# Patient Record
Sex: Male | Born: 1950
Health system: Southern US, Community
[De-identification: ages and names within clinical notes are randomized; demographics above are authoritative.]

## PROBLEM LIST (undated history)

## (undated) DIAGNOSIS — N529 Male erectile dysfunction, unspecified: Secondary | ICD-10-CM

## (undated) DIAGNOSIS — G5602 Carpal tunnel syndrome, left upper limb: Secondary | ICD-10-CM

## (undated) DIAGNOSIS — E739 Lactose intolerance, unspecified: Secondary | ICD-10-CM

## (undated) DIAGNOSIS — F17200 Nicotine dependence, unspecified, uncomplicated: Secondary | ICD-10-CM

## (undated) DIAGNOSIS — B029 Zoster without complications: Secondary | ICD-10-CM

## (undated) DIAGNOSIS — I10 Essential (primary) hypertension: Secondary | ICD-10-CM

## (undated) DIAGNOSIS — E559 Vitamin D deficiency, unspecified: Secondary | ICD-10-CM

## (undated) DIAGNOSIS — M545 Low back pain, unspecified: Secondary | ICD-10-CM

## (undated) DIAGNOSIS — T7840XA Allergy, unspecified, initial encounter: Secondary | ICD-10-CM

## (undated) DIAGNOSIS — G5600 Carpal tunnel syndrome, unspecified upper limb: Secondary | ICD-10-CM

## (undated) DIAGNOSIS — A048 Other specified bacterial intestinal infections: Secondary | ICD-10-CM

## (undated) DIAGNOSIS — S92912A Unspecified fracture of left toe(s), initial encounter for closed fracture: Secondary | ICD-10-CM

## (undated) DIAGNOSIS — IMO0001 Reserved for inherently not codable concepts without codable children: Secondary | ICD-10-CM

## (undated) DIAGNOSIS — I4892 Unspecified atrial flutter: Secondary | ICD-10-CM

## (undated) DIAGNOSIS — E785 Hyperlipidemia, unspecified: Secondary | ICD-10-CM

## (undated) DIAGNOSIS — G4700 Insomnia, unspecified: Secondary | ICD-10-CM

## (undated) DIAGNOSIS — K5790 Diverticulosis of intestine, part unspecified, without perforation or abscess without bleeding: Secondary | ICD-10-CM

## (undated) DIAGNOSIS — M1990 Unspecified osteoarthritis, unspecified site: Secondary | ICD-10-CM

## (undated) DIAGNOSIS — K219 Gastro-esophageal reflux disease without esophagitis: Secondary | ICD-10-CM

## (undated) DIAGNOSIS — M5135 Other intervertebral disc degeneration, thoracolumbar region: Secondary | ICD-10-CM

## (undated) DIAGNOSIS — M19012 Primary osteoarthritis, left shoulder: Secondary | ICD-10-CM

## (undated) DIAGNOSIS — U071 COVID-19: Secondary | ICD-10-CM

## (undated) HISTORY — PX: SPINE SURGERY: SHX786

## (undated) HISTORY — DX: Zoster without complications: B02.9

## (undated) HISTORY — DX: Gastro-esophageal reflux disease without esophagitis: K21.9

## (undated) HISTORY — PX: OTHER SURGICAL HISTORY: SHX169

## (undated) HISTORY — DX: Carpal tunnel syndrome, left upper limb: G56.02

## (undated) HISTORY — DX: Allergy, unspecified, initial encounter: T78.40XA

## (undated) HISTORY — DX: Other specified bacterial intestinal infections: A04.8

## (undated) HISTORY — DX: Hyperlipidemia, unspecified: E78.5

## (undated) HISTORY — DX: Reserved for inherently not codable concepts without codable children: IMO0001

## (undated) HISTORY — DX: Insomnia, unspecified: G47.00

## (undated) HISTORY — DX: Unspecified atrial flutter: I48.92

## (undated) HISTORY — PX: BACK SURGERY: SHX140

## (undated) HISTORY — PX: CARDIAC CATHETERIZATION: SHX172

## (undated) HISTORY — DX: Low back pain, unspecified: M54.50

## (undated) HISTORY — DX: Other intervertebral disc degeneration, thoracolumbar region: M51.35

## (undated) HISTORY — DX: Low back pain: M54.5

## (undated) HISTORY — DX: COVID-19: U07.1

## (undated) HISTORY — PX: COLONOSCOPY: SHX174

## (undated) HISTORY — DX: Essential (primary) hypertension: I10

## (undated) HISTORY — DX: Vitamin D deficiency, unspecified: E55.9

## (undated) HISTORY — DX: Primary osteoarthritis, left shoulder: M19.012

## (undated) HISTORY — DX: Nicotine dependence, unspecified, uncomplicated: F17.200

## (undated) HISTORY — DX: Carpal tunnel syndrome, unspecified upper limb: G56.00

## (undated) HISTORY — DX: Unspecified fracture of left toe(s), initial encounter for closed fracture: S92.912A

## (undated) HISTORY — PX: ESOPHAGOGASTRODUODENOSCOPY: SHX1529

## (undated) HISTORY — DX: Unspecified osteoarthritis, unspecified site: M19.90

## (undated) HISTORY — DX: Male erectile dysfunction, unspecified: N52.9

## (undated) HISTORY — DX: Diverticulosis of intestine, part unspecified, without perforation or abscess without bleeding: K57.90

## (undated) HISTORY — DX: Lactose intolerance, unspecified: E73.9

## (undated) HISTORY — PX: APPENDECTOMY: SHX54

## (undated) HISTORY — PX: CARPAL TUNNEL RELEASE: SHX101

---

## 2012-11-06 ENCOUNTER — Ambulatory Visit (INDEPENDENT_AMBULATORY_CARE_PROVIDER_SITE_OTHER): Payer: BC Managed Care – PPO | Admitting: Internal Medicine

## 2012-11-06 ENCOUNTER — Encounter: Payer: Self-pay | Admitting: Internal Medicine

## 2012-11-06 VITALS — BP 150/85 | HR 58 | Temp 97.3°F | Ht >= 80 in | Wt 212.2 lb

## 2012-11-06 DIAGNOSIS — M5135 Other intervertebral disc degeneration, thoracolumbar region: Secondary | ICD-10-CM | POA: Insufficient documentation

## 2012-11-06 DIAGNOSIS — K5730 Diverticulosis of large intestine without perforation or abscess without bleeding: Secondary | ICD-10-CM | POA: Insufficient documentation

## 2012-11-06 DIAGNOSIS — J302 Other seasonal allergic rhinitis: Secondary | ICD-10-CM

## 2012-11-06 DIAGNOSIS — Z720 Tobacco use: Secondary | ICD-10-CM

## 2012-11-06 DIAGNOSIS — K219 Gastro-esophageal reflux disease without esophagitis: Secondary | ICD-10-CM

## 2012-11-06 DIAGNOSIS — J309 Allergic rhinitis, unspecified: Secondary | ICD-10-CM

## 2012-11-06 DIAGNOSIS — E739 Lactose intolerance, unspecified: Secondary | ICD-10-CM

## 2012-11-06 DIAGNOSIS — E559 Vitamin D deficiency, unspecified: Secondary | ICD-10-CM

## 2012-11-06 DIAGNOSIS — IMO0002 Reserved for concepts with insufficient information to code with codable children: Secondary | ICD-10-CM

## 2012-11-06 DIAGNOSIS — E785 Hyperlipidemia, unspecified: Secondary | ICD-10-CM

## 2012-11-06 DIAGNOSIS — N529 Male erectile dysfunction, unspecified: Secondary | ICD-10-CM

## 2012-11-06 DIAGNOSIS — I10 Essential (primary) hypertension: Secondary | ICD-10-CM

## 2012-11-06 DIAGNOSIS — F17200 Nicotine dependence, unspecified, uncomplicated: Secondary | ICD-10-CM

## 2012-11-06 HISTORY — DX: Tobacco use: Z72.0

## 2012-11-06 LAB — BASIC METABOLIC PANEL
BUN: 14 mg/dL (ref 6–23)
CO2: 26 mEq/L (ref 19–32)
Chloride: 105 mEq/L (ref 96–112)
Creat: 1.01 mg/dL (ref 0.50–1.35)
Glucose, Bld: 84 mg/dL (ref 70–99)

## 2012-11-06 MED ORDER — CALCIUM CARB-CHOLECALCIFEROL 600-800 MG-UNIT PO TABS: 600.0000 mg | ORAL_TABLET | Freq: Every day | ORAL | Status: DC

## 2012-11-06 MED ORDER — TRIAMTERENE-HCTZ 37.5-25 MG PO CAPS: 1.0000 | ORAL_CAPSULE | ORAL | Status: DC

## 2012-11-06 MED ORDER — PANTOPRAZOLE SODIUM 40 MG PO TBEC: 40.0000 mg | DELAYED_RELEASE_TABLET | Freq: Every day | ORAL | Status: DC

## 2012-11-06 MED ORDER — ASPIRIN 81 MG PO TABS: 81.0000 mg | ORAL_TABLET | Freq: Every day | ORAL | Status: DC

## 2012-11-06 MED ORDER — AMLODIPINE BESY-BENAZEPRIL HCL 10-40 MG PO CAPS: 1.0000 | ORAL_CAPSULE | Freq: Every day | ORAL | Status: DC

## 2012-11-06 NOTE — Patient Instructions (Addendum)
General Instructions:  Return in 1 month for follow up with Dr. Shirlee Latch  Start taking your new blood pressure medication HCTZ-Triamterine 25 mg 37.5 mg daily.  Stop if noticing side effects    Treatment Goals:  Goals (1 Years of Data) as of 11/06/12         As of Today     Blood Pressure    . Blood Pressure < 140/90  150/85      Progress Toward Treatment Goals:  Treatment Goal 11/06/2012  Blood pressure unable to assess  Stop smoking smoking the same amount    Self Care Goals & Plans:  Self Care Goal 11/06/2012  Manage my medications take my medicines as prescribed; bring my medications to every visit; refill my medications on time  Monitor my health keep track of my blood pressure  Eat healthy foods drink diet soda or water instead of juice or soda; eat more vegetables; eat foods that are low in salt; eat baked foods instead of fried foods  Be physically active find an activity I enjoy       Care Management & Community Referrals:  Referral 11/06/2012  Referrals made for care management support none needed  Referrals made to community resources none        Itching Itching is a symptom that can be caused by many things. These include skin problems (including infections) as well as some internal diseases.  If the itching is affecting just one area of the body, it is most likely due to a common skin problem, such as:  Poison oak and poison ivy.  Contact dermatitis (skin irritation from a plant, chemicals, fiberglass, detergents, new cosmetic, new jewelry, or other substance).  Fungus (such as athlete's foot, jock itch, or ringworm).  Head lice  Dandruff  Insect bite  Infection (such as Shingles or other virus infections). If the itching is all over (widespread), the possible causes are many. These include:   Dry skin or eczema  Heat rash  Hives  Liver disorders  Kidney disorders TREATMENT  Localized itching   Lubrication of the skin. Use an ointment or  cream or other unperfumed moisturizers if the skin is dry. Apply frequently, especially after bathing.  Anti-itch medicines. These medications may help control the urge to scratch. Scratching always makes itching worse and increases the chance of getting an infection.  Cortisone creams and ointments. These help reduce the inflammation.  Antibiotics. Skin infections can cause itching. Topical or oral antibiotics may be needed for 10 to 20 days to get rid of an infection. If you can identify what caused the itching, avoid this substance in the future.  Widespread itching  The following measures may help to relieve itching regardless of the cause:   Wash the skin once with soap to remove irritants.  Bathe in tepid water with baking soda, cornstarch, or oatmeal.  Use calamine lotion (nonprescription) or a baking soda solution (1 teaspoon in 4 ounces of water on the skin).  Apply 1% hydrocortisone cream (no prescription needed). Do not use this if there might be a skin infection.  Avoid scratching.  Avoid itchy or tight-fitting clothes.  Avoid excessive heat, sweating, scented soaps, and swimming pools.  The lubricants, anti-itch medicines, etc. noted above may be helpful for controlling symptoms. SEEK MEDICAL CARE IF:   The itching becomes severe.  Your itch is not better after 1 week of treatment. Contact your caregiver to schedule further evaluation. Document Released: 06/21/2005 Document Revised: 09/13/2011 Document Reviewed:  12/09/2006 [employer] Patient Information 2013 [place], [place].  Hypertension As your heart beats, it forces blood through your arteries. This force is your blood pressure. If the pressure is too high, it is called hypertension (HTN) or high blood pressure. HTN is dangerous because you may have it and not know it. High blood pressure may mean that your heart has to work harder to pump blood. Your arteries may be narrow or stiff. The extra work puts you at risk  for heart disease, stroke, and other problems.  Blood pressure consists of two numbers, a higher number over a lower, 110/72, for example. It is stated as "110 over 72." The ideal is below 120 for the top number (systolic) and under 80 for the bottom (diastolic). Write down your blood pressure today. You should pay close attention to your blood pressure if you have certain conditions such as:  Heart failure.  Prior heart attack.  Diabetes  Chronic kidney disease.  Prior stroke.  Multiple risk factors for heart disease. To see if you have HTN, your blood pressure should be measured while you are seated with your arm held at the level of the heart. It should be measured at least twice. A one-time elevated blood pressure reading (especially in the Emergency Department) does not mean that you need treatment. There may be conditions in which the blood pressure is different between your right and left arms. It is important to see your caregiver soon for a recheck. Most people have essential hypertension which means that there is not a specific cause. This type of high blood pressure may be lowered by changing lifestyle factors such as:  Stress.  Smoking.  Lack of exercise.  Excessive weight.  Drug/tobacco/alcohol use.  Eating less salt. Most people do not have symptoms from high blood pressure until it has caused damage to the body. Effective treatment can often prevent, delay or reduce that damage. TREATMENT  When a cause has been identified, treatment for high blood pressure is directed at the cause. There are a large number of medications to treat HTN. These fall into several categories, and your caregiver will help you select the medicines that are best for you. Medications may have side effects. You should review side effects with your caregiver. If your blood pressure stays high after you have made lifestyle changes or started on medicines,   Your medication(s) may need to be  changed.  Other problems may need to be addressed.  Be certain you understand your prescriptions, and know how and when to take your medicine.  Be sure to follow up with your caregiver within the time frame advised (usually within two weeks) to have your blood pressure rechecked and to review your medications.  If you are taking more than one medicine to lower your blood pressure, make sure you know how and at what times they should be taken. Taking two medicines at the same time can result in blood pressure that is too low. SEEK IMMEDIATE MEDICAL CARE IF:  You develop a severe headache, blurred or changing vision, or confusion.  You have unusual weakness or numbness, or a faint feeling.  You have severe chest or abdominal pain, vomiting, or breathing problems. MAKE SURE YOU:   Understand these instructions.  Will watch your condition.  Will get help right away if you are not doing well or get worse. Document Released: 06/21/2005 Document Revised: 09/13/2011 Document Reviewed: 02/09/2008 [facility] Patient Information 2013 [place], [place].  Erectile Dysfunction Erectile dysfunction (ED) is  the inability to get a good enough erection to have sexual intercourse. ED may involve:  Inability to get an erection.  Lack of enough hardness to allow penetration.  Loss of the erection before sex is finished.  Premature ejaculation.  Any combination of these problems if they occur more than 25% of the time. CAUSES  Certain drugs, such as:  Pain relievers.  Antihistamines.  Antidepressants.  Blood pressure medicines.  Water pills.  Ulcer medicines.  Muscle relaxants.  Illegal drugs.  Excessive drinking.  Psychological causes, such as:  Anxiety.  Depression.  Sadness.  Exhaustion.  Performance fear.  Stress.  Physical causes, such as:  Artery problems. This may include diabetes, smoking, liver disease, or atherosclerosis.  High blood pressure.  Hormonal  problems, such as low testosterone.  Obesity.  Nerve problems. This may include back or pelvic injuries, diabetes, multiple sclerosis, Parkinson's disease, or some surgeries. SYMPTOMS  Inability to get an erection.  Lack of enough hardness to allow penetration.  Loss of the erection before sex is finished.  Premature ejaculation.  Normal erections at some times, but with frequent unsatisfactory episodes.  Orgasms that are not satisfactory in sensation or frequency.  Low sexual satisfaction in either partner because of erection problems.  A curved penis occurring with erection. The curve may cause pain or may be too curved to allow for intercourse.  Never having nighttime erections. DIAGNOSIS Your caregiver can often diagnose this condition by:  Performing a physical exam to find other diseases or specific problems with the penis.  Asking you detailed questions about the problem.  Performing blood tests to check for diabetes or to measure hormone levels.  Performing urine tests to find other underlying health conditions.  Performing an ultrasound to check for scarring.  Performing a test to check blood flow to the penis.  Doing a sleep study at home to measure nighttime erections. TREATMENT   You may be prescribed medicines by mouth.  You may be given medicine injections into the penis.  You may be prescribed a vacuum pump with a ring.  Penile implant surgery may be performed. You may receive:  An inflatable implant.  A semi-rigid implant.  Blood vessel surgery may be performed. HOME CARE INSTRUCTIONS  Take all medicine as directed by your caregiver. Do not take any other medicines without talking to your caregiver first.  Follow your caregiver's directions for specific treatments as prescribed.  Follow up with your caregiver as directed. Document Released: 06/18/2000 Document Revised: 09/13/2011 Document Reviewed: 10/11/2010 [facility] Patient Information  2013 [place], [place].

## 2012-11-07 LAB — PROLACTIN: Prolactin: 6.2 ng/mL (ref 2.1–17.1)

## 2012-11-08 ENCOUNTER — Encounter: Payer: Self-pay | Admitting: Internal Medicine

## 2012-11-09 ENCOUNTER — Encounter: Payer: Self-pay | Admitting: Internal Medicine

## 2012-11-09 DIAGNOSIS — E785 Hyperlipidemia, unspecified: Secondary | ICD-10-CM | POA: Insufficient documentation

## 2012-11-09 DIAGNOSIS — E559 Vitamin D deficiency, unspecified: Secondary | ICD-10-CM | POA: Insufficient documentation

## 2012-11-09 NOTE — Assessment & Plan Note (Signed)
  Assessment: Progress toward smoking cessation:  smoking the same amount Barriers to progress toward smoking cessation:  none Comments: none  Plan: Instruction/counseling given:  I advised patient to stop smoking. Educational resources provided:  [employer]) brochure Self management tools provided:    Medications to assist with smoking cessation:  discussed Nicotine patch Patient agreed to the following self-care plans for smoking cessation:    Other plans: continue counseling

## 2012-11-09 NOTE — Assessment & Plan Note (Signed)
Checked HA1C for w/u of etiology.  Other considerations could be BP medications, smoking Advise patient if ED is not improving consider referral to urology.

## 2012-11-09 NOTE — Progress Notes (Signed)
  Subjective:    Patient ID: John Keith, male    DOB: 05/22/1951, 62 y.o.   MRN: [id number]  HPI Comments: Patient seen and evaluated 11/06/12.  62 y.o former [facility] patient here to establish care.  Records were reviewed from [facility] (05/2012 tsh 0.527, PSA 1.2, vitamin D level 20.7 (low).  PMH HTN and he thinks he is allergic to Hydralazine with doses of medication he notices itching.  Erectile dysfunction x >1 year.  No decreased libido.  Cialis and Viagra are not working.  He denies any other concerns or complaints.   Additional medications: Aleve as needed SH: Married.  He is the only child.  No kids. He has a step son. Smokes 1/2 ppd.  Denies alcohol, other drugs. Patient works 6 days a week.  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   Cardiovascular: Negative for chest pain and leg swelling.  Gastrointestinal: Negative for abdominal pain, constipation and blood in stool.       Heartburn   Genitourinary: Negative for hematuria.       Objective:   Physical Exam  Nursing note and vitals reviewed. Constitutional: He is oriented to person, place, and time. He appears well-developed and well-nourished. He is cooperative.  pleasant  HENT:  Head: Normocephalic and atraumatic.  Mouth/Throat: Oropharynx is clear and moist and mucous membranes are normal. No oropharyngeal exudate.  Eyes: Conjunctivae are normal. Pupils are equal, round, and reactive to light. Right eye exhibits no discharge. Left eye exhibits no discharge. No scleral icterus.  Cardiovascular: Normal rate, regular rhythm, S1 normal, S2 normal and normal heart sounds.   No murmur heard. Pulmonary/Chest: Effort normal and breath sounds normal.  Abdominal: Soft. Bowel sounds are normal. He exhibits no distension. There is no tenderness.  Musculoskeletal: He exhibits no edema.  Neurological:  He is alert and oriented to person, place, and time. He has normal strength. Gait normal.  Skin: Skin is warm, dry and intact. No rash noted.  Psychiatric: He has a normal mood and affect. His speech is normal and behavior is normal. Judgment and thought content normal. Cognition and memory are normal.          Assessment & Plan:  F/u 1 month HTN

## 2012-11-09 NOTE — Assessment & Plan Note (Signed)
05/2012 lipid panel TC 177, TG 83, HDL 66, LDL 94

## 2012-11-09 NOTE — Assessment & Plan Note (Signed)
Will give supplementation daily

## 2012-11-09 NOTE — Assessment & Plan Note (Signed)
150/85 (58) then 164/87 (52)  Will d/c Hydralazine 100 tid due to patient itching Will continue Norvasc 10-Benazepril 40 daily and change HCTZ 25 mg to combination with Triamterine 37.5.    BP Readings from Last 3 Encounters:  11/06/12 150/85    Lab Results  Component Value Date   NA 139 11/06/2012   K 4.0 11/06/2012   CREATININE 1.01 11/06/2012    Assessment: Blood pressure control: mildly elevated Progress toward BP goal:  unable to assess Comments: see treatment changes above   Plan: Medications: see treatment changes above Educational resources provided: handout Self management tools provided: instructions for home blood pressure monitoring Other plans: f/u in 1 month

## 2012-11-09 NOTE — Assessment & Plan Note (Signed)
Protonix 40 mg daily

## 2012-11-09 NOTE — Progress Notes (Signed)
Case discussed with Dr. McLean at the time of the visit.  We reviewed the resident's history and exam and pertinent patient test results.  I agree with the assessment, diagnosis and plan of care documented in the resident's note. 

## 2013-01-03 ENCOUNTER — Ambulatory Visit (INDEPENDENT_AMBULATORY_CARE_PROVIDER_SITE_OTHER): Payer: BC Managed Care – PPO | Admitting: Internal Medicine

## 2013-01-03 ENCOUNTER — Encounter: Payer: Self-pay | Admitting: Internal Medicine

## 2013-01-03 VITALS — BP 138/83 | HR 55 | Temp 97.4°F | Ht >= 80 in | Wt 211.1 lb

## 2013-01-03 DIAGNOSIS — G5600 Carpal tunnel syndrome, unspecified upper limb: Secondary | ICD-10-CM

## 2013-01-03 DIAGNOSIS — R42 Dizziness and giddiness: Secondary | ICD-10-CM

## 2013-01-03 DIAGNOSIS — N529 Male erectile dysfunction, unspecified: Secondary | ICD-10-CM

## 2013-01-03 DIAGNOSIS — Z720 Tobacco use: Secondary | ICD-10-CM

## 2013-01-03 DIAGNOSIS — G5602 Carpal tunnel syndrome, left upper limb: Secondary | ICD-10-CM | POA: Insufficient documentation

## 2013-01-03 DIAGNOSIS — F17200 Nicotine dependence, unspecified, uncomplicated: Secondary | ICD-10-CM

## 2013-01-03 DIAGNOSIS — I10 Essential (primary) hypertension: Secondary | ICD-10-CM

## 2013-01-03 DIAGNOSIS — E559 Vitamin D deficiency, unspecified: Secondary | ICD-10-CM

## 2013-01-03 DIAGNOSIS — Z8669 Personal history of other diseases of the nervous system and sense organs: Secondary | ICD-10-CM

## 2013-01-03 MED ORDER — HYDRALAZINE HCL 100 MG PO TABS: 100.0000 mg | ORAL_TABLET | Freq: Three times a day (TID) | ORAL | Status: DC

## 2013-01-03 NOTE — Progress Notes (Signed)
  Subjective:    Patient ID: John Keith, male    DOB: 21-Jun-1951, 62 y.o.   MRN: [id number]  HPI Comments: 62 y.o F/u for HTN and ED.  He states since last visit he felt dizzy at work and weak with blue and white pill (per epocrates is Norvasc-Benazepril 10-40 mg daily).  He is also taking Triamterine-HCTZ 37.5-25 for BP.  Due to this happening he resumed Hydralazine 100 mg tid with toleration (i.e no itching) and stopped the Norvasc Benazepril.  He states tomatoes make him itch.  He denies being dizzy or weak.  He states he thought this medication was causing him to itch but actually it was eating tomatoes.  BP is 138/83 today. HR 55 today.    He states he has carpal tunnel in his left wrist and has previously worn a wrist splint.  Recently w/in the last week with physical activity his left hand fingers have started being numb and tingling.    He states his erectile dysfunction is still a problem and wonders what to do next.   He is smoking less 5 cigarettes daily and trying to quit     Review of Systems  Respiratory: Negative for shortness of breath.   Cardiovascular: Negative for chest pain.  Neurological: Negative for dizziness, weakness and light-headedness.       Objective:   Physical Exam  Nursing note and vitals reviewed. Constitutional: He is oriented to person, place, and time. Vital signs are normal. He appears well-developed and well-nourished. He is cooperative.  HENT:  Head: Normocephalic and atraumatic.  Mouth/Throat: No oropharyngeal exudate.  Eyes: Conjunctivae are normal. Pupils are equal, round, and reactive to light. Right eye exhibits no discharge. Left eye exhibits no discharge. No scleral icterus.  Cardiovascular: Normal rate, regular rhythm, S1 normal, S2 normal and normal heart sounds.   No murmur heard. Pulmonary/Chest: Effort normal and breath sounds normal.  Abdominal: Soft. Bowel sounds are normal. He exhibits no distension. There is no tenderness.   Musculoskeletal: He exhibits no edema.  No deformities left wrist   Neurological: He is alert and oriented to person, place, and time. He has normal strength. Gait normal.  Skin: Skin is warm, dry and intact. No rash noted.  Psychiatric: He has a normal mood and affect. His speech is normal and behavior is normal. Judgment and thought content normal. Cognition and memory are normal.          Assessment & Plan:  Check blood pressure and keep daily log  Follow up in 3-4 months for blood pressure. Appointment sooner if needed Call if feeling dizzy again [phone]  Wear wrist splint at night or with activity  Try to quit smoking

## 2013-01-03 NOTE — Assessment & Plan Note (Addendum)
BP Readings from Last 3 Encounters:  01/03/13 138/83  11/06/12 150/85    Lab Results  Component Value Date   NA 139 11/06/2012   K 4.0 11/06/2012   CREATININE 1.01 11/06/2012    Assessment: Blood pressure control: controlled Progress toward BP goal:  at goal Comments: controlled   Plan: Medications:  patient feeling lightheaded with Norvasc 10-Benazipril 40. He resumed Hydralazine 100 mg tid and is taking Triamterine-HCTZ 37.5-25.  will d/c CCB-ACEI and keep current meds with BP good control. Advised to call back if feel lightheaded or dizzy and keep a log of BP daily .  If review log of BP in the future consider titrating down BP medications  Educational resources provided: brochure;handout Self management tools provided:   Other plans: recommend he log blood pressure

## 2013-01-03 NOTE — Assessment & Plan Note (Signed)
  Assessment: Progress toward smoking cessation:  smoking less Barriers to progress toward smoking cessation:   (duration of smoking but trying to quit) Comments: given info to quit he is trying  Plan: Instruction/counseling given:  I counseled patient on the dangers of tobacco use, advised patient to stop smoking, and reviewed strategies to maximize success. Educational resources provided:  [employer]) brochure Self management tools provided:    Medications to assist with smoking cessation:  none Patient agreed to the following self-care plans for smoking cessation: call [facility] (1-800-QUIT-NOW)  Other plans: none

## 2013-01-03 NOTE — Assessment & Plan Note (Signed)
Repeat 25 hydroxy vitamin D today

## 2013-01-03 NOTE — Patient Instructions (Addendum)
General Instructions: Check blood pressure and keep daily log  Follow up in 3-4 months for blood pressure. Appointment sooner if needed Call if feeling dizzy again [phone]  Wear wrist splint at night  Try to quit smoking   Treatment Goals:  Goals (1 Years of Data) as of 01/03/13         As of Today 11/06/12     Blood Pressure    . Blood Pressure < 140/90  138/83 150/85      Progress Toward Treatment Goals:  Treatment Goal 01/03/2013  Blood pressure at goal  Stop smoking smoking less    Self Care Goals & Plans:  Self Care Goal 01/03/2013  Manage my medications take my medicines as prescribed; bring my medications to every visit; refill my medications on time  Monitor my health keep track of my blood pressure; bring my blood pressure log to each visit  Eat healthy foods drink diet soda or water instead of juice or soda; eat more vegetables; eat foods that are low in salt; eat baked foods instead of fried foods  Be physically active find an activity I enjoy  Stop smoking call [facility] (1-800-QUIT-NOW)  Meeting treatment goals maintain the current self-care plan       Care Management & Community Referrals:  Referral 01/03/2013  Referrals made for care management support none needed  Referrals made to community resources none       Smoking Cessation Quitting smoking is important to your health and has many advantages. However, it is not always easy to quit since nicotine is a very addictive drug. Often times, people try 3 times or more before being able to quit. This document explains the best ways for you to prepare to quit smoking. Quitting takes hard work and a lot of effort, but you can do it. ADVANTAGES OF QUITTING SMOKING  You will live longer, feel better, and live better.  Your body will feel the impact of quitting smoking almost immediately.  Within 20 minutes, blood pressure decreases. Your pulse returns to its normal level.  After 8 hours, carbon monoxide  levels in the blood return to normal. Your oxygen level increases.  After 24 hours, the chance of having a heart attack starts to decrease. Your breath, hair, and body stop smelling like smoke.  After 48 hours, damaged nerve endings begin to recover. Your sense of taste and smell improve.  After 72 hours, the body is virtually free of nicotine. Your bronchial tubes relax and breathing becomes easier.  After 2 to 12 weeks, lungs can hold more air. Exercise becomes easier and circulation improves.  The risk of having a heart attack, stroke, cancer, or lung disease is greatly reduced.  After 1 year, the risk of coronary heart disease is cut in half.  After 5 years, the risk of stroke falls to the same as a nonsmoker.  After 10 years, the risk of lung cancer is cut in half and the risk of other cancers decreases significantly.  After 15 years, the risk of coronary heart disease drops, usually to the level of a nonsmoker.  If you are pregnant, quitting smoking will improve your chances of having a healthy baby.  The people you live with, especially any children, will be healthier.  You will have extra money to spend on things other than cigarettes. QUESTIONS TO THINK ABOUT BEFORE ATTEMPTING TO QUIT You may want to talk about your answers with your caregiver.  Why do you want to quit?  If you tried to quit in the past, what helped and what did not?  What will be the most difficult situations for you after you quit? How will you plan to handle them?  Who can help you through the tough times? Your family? Friends? A caregiver?  What pleasures do you get from smoking? What ways can you still get pleasure if you quit? Here are some questions to ask your caregiver:  How can you help me to be successful at quitting?  What medicine do you think would be best for me and how should I take it?  What should I do if I need more help?  What is smoking withdrawal like? How can I get  information on withdrawal? GET READY  Set a quit date.  Change your environment by getting rid of all cigarettes, ashtrays, matches, and lighters in your home, car, or work. Do not let people smoke in your home.  Review your past attempts to quit. Think about what worked and what did not. GET SUPPORT AND ENCOURAGEMENT You have a better chance of being successful if you have help. You can get support in many ways.  Tell your family, friends, and co-workers that you are going to quit and need their support. Ask them not to smoke around you.  Get individual, group, or telephone counseling and support. Programs are available at [employer] and health centers. Call your local health department for information about programs in your area.  Spiritual beliefs and practices may help some smokers quit.  Download a "quit meter" on your computer to keep track of quit statistics, such as how long you have gone without smoking, cigarettes not smoked, and money saved.  Get a self-help book about quitting smoking and staying off of tobacco. LEARN NEW SKILLS AND BEHAVIORS  Distract yourself from urges to smoke. Talk to someone, go for a walk, or occupy your time with a task.  Change your normal routine. Take a different route to work. Drink tea instead of coffee. Eat breakfast in a different place.  Reduce your stress. Take a hot bath, exercise, or read a book.  Plan something enjoyable to do every day. Reward yourself for not smoking.  Explore interactive web-based programs that specialize in helping you quit. GET MEDICINE AND USE IT CORRECTLY Medicines can help you stop smoking and decrease the urge to smoke. Combining medicine with the above behavioral methods and support can greatly increase your chances of successfully quitting smoking.  Nicotine replacement therapy helps deliver nicotine to your body without the negative effects and risks of smoking. Nicotine replacement therapy includes  nicotine gum, lozenges, inhalers, nasal sprays, and skin patches. Some may be available over-the-counter and others require a prescription.  Antidepressant medicine helps people abstain from smoking, but how this works is unknown. This medicine is available by prescription.  Nicotinic receptor partial agonist medicine simulates the effect of nicotine in your brain. This medicine is available by prescription. Ask your caregiver for advice about which medicines to use and how to use them based on your health history. Your caregiver will tell you what side effects to look out for if you choose to be on a medicine or therapy. Carefully read the information on the package. Do not use any other product containing nicotine while using a nicotine replacement product.  RELAPSE OR DIFFICULT SITUATIONS Most relapses occur within the first 3 months after quitting. Do not be discouraged if you start smoking again. Remember, most people try several  times before finally quitting. You may have symptoms of withdrawal because your body is used to nicotine. You may crave cigarettes, be irritable, feel very hungry, cough often, get headaches, or have difficulty concentrating. The withdrawal symptoms are only temporary. They are strongest when you first quit, but they will go away within 10 14 days. To reduce the chances of relapse, try to:  Avoid drinking alcohol. Drinking lowers your chances of successfully quitting.  Reduce the amount of caffeine you consume. Once you quit smoking, the amount of caffeine in your body increases and can give you symptoms, such as a rapid heartbeat, sweating, and anxiety.  Avoid smokers because they can make you want to smoke.  Do not let weight gain distract you. Many smokers will gain weight when they quit, usually less than 10 pounds. Eat a healthy diet and stay active. You can always lose the weight gained after you quit.  Find ways to improve your mood other than smoking. FOR  MORE INFORMATION  www.smokefree.gov  Document Released: 06/15/2001 Document Revised: 12/21/2011 Document Reviewed: 09/30/2011 [facility] Patient Information 2014 [place], [place].  Erectile Dysfunction Erectile dysfunction (ED) is the inability to get a good enough erection to have sexual intercourse. ED may involve:  Inability to get an erection.  Lack of enough hardness to allow penetration.  Loss of the erection before sex is finished.  Premature ejaculation.  Any combination of these problems if they occur more than 25% of the time. CAUSES  Certain drugs, such as:  Pain relievers.  Antihistamines.  Antidepressants.  Blood pressure medicines.  Water pills.  Ulcer medicines.  Muscle relaxants.  Illegal drugs.  Excessive drinking.  Psychological causes, such as:  Anxiety.  Depression.  Sadness.  Exhaustion.  Performance fear.  Stress.  Physical causes, such as:  Artery problems. This may include diabetes, smoking, liver disease, or atherosclerosis.  High blood pressure.  Hormonal problems, such as low testosterone.  Obesity.  Nerve problems. This may include back or pelvic injuries, diabetes, multiple sclerosis, Parkinson's disease, or some surgeries. SYMPTOMS  Inability to get an erection.  Lack of enough hardness to allow penetration.  Loss of the erection before sex is finished.  Premature ejaculation.  Normal erections at some times, but with frequent unsatisfactory episodes.  Orgasms that are not satisfactory in sensation or frequency.  Low sexual satisfaction in either partner because of erection problems.  A curved penis occurring with erection. The curve may cause pain or may be too curved to allow for intercourse.  Never having nighttime erections. DIAGNOSIS Your caregiver can often diagnose this condition by:  Performing a physical exam to find other diseases or specific problems with the penis.  Asking you detailed  questions about the problem.  Performing blood tests to check for diabetes or to measure hormone levels.  Performing urine tests to find other underlying health conditions.  Performing an ultrasound to check for scarring.  Performing a test to check blood flow to the penis.  Doing a sleep study at home to measure nighttime erections. TREATMENT   You may be prescribed medicines by mouth.  You may be given medicine injections into the penis.  You may be prescribed a vacuum pump with a ring.  Penile implant surgery may be performed. You may receive:  An inflatable implant.  A semi-rigid implant.  Blood vessel surgery may be performed. HOME CARE INSTRUCTIONS  Take all medicine as directed by your caregiver. Do not take any other medicines without talking to your caregiver  first.  Follow your caregiver's directions for specific treatments as prescribed.  Follow up with your caregiver as directed. Document Released: 06/18/2000 Document Revised: 09/13/2011 Document Reviewed: 10/11/2010 [facility] Patient Information 2014 [place], [place].  Hypertension As your heart beats, it forces blood through your arteries. This force is your blood pressure. If the pressure is too high, it is called hypertension (HTN) or high blood pressure. HTN is dangerous because you may have it and not know it. High blood pressure may mean that your heart has to work harder to pump blood. Your arteries may be narrow or stiff. The extra work puts you at risk for heart disease, stroke, and other problems.  Blood pressure consists of two numbers, a higher number over a lower, 110/72, for example. It is stated as "110 over 72." The ideal is below 120 for the top number (systolic) and under 80 for the bottom (diastolic). Write down your blood pressure today. You should pay close attention to your blood pressure if you have certain conditions such as:  Heart failure.  Prior heart attack.  Diabetes  Chronic  kidney disease.  Prior stroke.  Multiple risk factors for heart disease. To see if you have HTN, your blood pressure should be measured while you are seated with your arm held at the level of the heart. It should be measured at least twice. A one-time elevated blood pressure reading (especially in the Emergency Department) does not mean that you need treatment. There may be conditions in which the blood pressure is different between your right and left arms. It is important to see your caregiver soon for a recheck. Most people have essential hypertension which means that there is not a specific cause. This type of high blood pressure may be lowered by changing lifestyle factors such as:  Stress.  Smoking.  Lack of exercise.  Excessive weight.  Drug/tobacco/alcohol use.  Eating less salt. Most people do not have symptoms from high blood pressure until it has caused damage to the body. Effective treatment can often prevent, delay or reduce that damage. TREATMENT  When a cause has been identified, treatment for high blood pressure is directed at the cause. There are a large number of medications to treat HTN. These fall into several categories, and your caregiver will help you select the medicines that are best for you. Medications may have side effects. You should review side effects with your caregiver. If your blood pressure stays high after you have made lifestyle changes or started on medicines,   Your medication(s) may need to be changed.  Other problems may need to be addressed.  Be certain you understand your prescriptions, and know how and when to take your medicine.  Be sure to follow up with your caregiver within the time frame advised (usually within two weeks) to have your blood pressure rechecked and to review your medications.  If you are taking more than one medicine to lower your blood pressure, make sure you know how and at what times they should be taken. Taking two  medicines at the same time can result in blood pressure that is too low. SEEK IMMEDIATE MEDICAL CARE IF:  You develop a severe headache, blurred or changing vision, or confusion.  You have unusual weakness or numbness, or a faint feeling.  You have severe chest or abdominal pain, vomiting, or breathing problems. MAKE SURE YOU:   Understand these instructions.  Will watch your condition.  Will get help right away if you are not  doing well or get worse. Document Released: 06/21/2005 Document Revised: 09/13/2011 Document Reviewed: 02/09/2008 [facility] Patient Information 2014 [place].

## 2013-01-03 NOTE — Assessment & Plan Note (Addendum)
Will refer to urology and I worked up at last visit.  Will see what other input from urology.

## 2013-01-03 NOTE — Assessment & Plan Note (Signed)
Wrist splint at night or with activity

## 2013-01-04 ENCOUNTER — Encounter: Payer: Self-pay | Admitting: Internal Medicine

## 2013-01-04 LAB — VITAMIN D 25 HYDROXY (VIT D DEFICIENCY, FRACTURES): Vit D, 25-Hydroxy: 42 ng/mL (ref 30–89)

## 2013-01-08 NOTE — Progress Notes (Signed)
Case discussed with Dr. McLean at the time of the visit.  We reviewed the resident's history and exam and pertinent patient test results.  I agree with the assessment, diagnosis, and plan of care documented in the resident's note.     

## 2013-01-22 ENCOUNTER — Ambulatory Visit (INDEPENDENT_AMBULATORY_CARE_PROVIDER_SITE_OTHER): Payer: BC Managed Care – PPO | Admitting: Internal Medicine

## 2013-01-22 ENCOUNTER — Encounter: Payer: Self-pay | Admitting: Internal Medicine

## 2013-01-22 VITALS — BP 136/84 | HR 66 | Temp 97.6°F | Ht >= 80 in | Wt 211.5 lb

## 2013-01-22 DIAGNOSIS — H6123 Impacted cerumen, bilateral: Secondary | ICD-10-CM

## 2013-01-22 DIAGNOSIS — H6120 Impacted cerumen, unspecified ear: Secondary | ICD-10-CM

## 2013-01-22 DIAGNOSIS — I10 Essential (primary) hypertension: Secondary | ICD-10-CM

## 2013-01-22 MED ORDER — CARBAMIDE PEROXIDE 6.5 % OT SOLN: 5.0000 [drp] | Freq: Two times a day (BID) | OTIC | Status: DC

## 2013-01-22 MED ORDER — NEOMYCIN-POLYMYXIN-HC 3.5-10000-1 OT SUSP: 3.0000 [drp] | Freq: Four times a day (QID) | OTIC | Status: DC

## 2013-01-22 NOTE — Progress Notes (Signed)
I saw and evaluated the patient.  I personally confirmed the key portions of the history and exam documented by Dr. McLean and I reviewed pertinent patient test results.  The assessment, diagnosis, and plan were formulated together and I agree with the documentation in the resident's note.    

## 2013-01-22 NOTE — Progress Notes (Signed)
  Subjective:    Patient ID: John Keith, male    DOB: 1951/05/22, 62 y.o.   MRN: [id number]  HPI Comments: 62 y.o PMH (HTN BP 136/84), HLD, tobacco abuse, ED (with urology appt coming up soon).  He presents for decreased hearing left ear.  He states Tuesday he had decreased hearing and felt like wax was in his ear.  He bought cleaner from [employer] which had peroxide in them in he used drops 4-5 drops for 3 days and still feels like he cant hear in his left ear.  He does not feel like the ear solution helped.  He also states on Sunday his left ear was itching and he used a Qtip to try to get the wax out.  The itching comes and goes. He denies ear pain, drainage, fever, chills.    ROS per HPI-focused ROS today     Review of Systems  Neurological: Negative for dizziness.       Objective:   Physical Exam  Nursing note and vitals reviewed. Constitutional: Vital signs are normal. He appears well-developed and well-nourished. He is cooperative. No distress.  HENT:  Head: Normocephalic and atraumatic.  Right Ear: Hearing and external ear normal. No drainage, swelling or tenderness.  Left Ear: Tympanic membrane and external ear normal. No drainage, swelling or tenderness. Decreased hearing is noted.  Left ear: Canal initially not normal due to cerumen.  Left ear decreased hearing which improved after irrigation.  Both ears with increased cerumen. Left ear with small abrasion which was erythematous w/o drainage.   Right ear:  with cerumen after irrigation and patient stated that ear felt sore with irrigation. Right ear with even smaller abrasion w/o drainage. Hard to see canal and TM after irrigation on the right but prior to irrigation TM and canal normal  Neurological: He is alert.  Skin: He is not diaphoretic.  Psychiatric: He has a normal mood and affect. His speech is normal and behavior is normal. Judgment and thought content normal. Cognition and memory are normal.           Assessment & Plan:  Follow up in 3 months, sooner if needed

## 2013-01-22 NOTE — Patient Instructions (Addendum)
Please pick up your medications from the pharmacy  If your right ear is not getting better we can refer you to ear nose and throat doctor.  Follow up in 3 months for chronic medical conditions sooner if needed.   Carbamide Peroxide ear solution What is this medicine? CARBAMIDE PEROXIDE (CAR bah mide per OX ide) is used to soften and help remove ear wax. This medicine may be used for other purposes; ask your health care provider or pharmacist if you have questions. What should I tell my health care provider before I take this medicine? They need to know if you have any of these conditions: -dizziness -ear discharge -ear pain, irritation or rash -infection -perforated eardrum (hole in eardrum) -an unusual or allergic reaction to carbamide peroxide, glycerin, hydrogen peroxide, other medicines, foods, dyes, or preservatives -pregnant or trying to get pregnant -breast-feeding How should I use this medicine? This medicine is only for use in the outer ear canal. Follow the directions carefully. Wash hands before and after use. The solution may be warmed by holding the bottle in the hand for 1 to 2 minutes. Lie with the affected ear facing upward. Place the proper number of drops into the ear canal. After the drops are instilled, remain lying with the affected ear upward for 5 minutes to help the drops stay in the ear canal. A cotton ball may be gently inserted at the ear opening for no longer than 5 to 10 minutes to ensure retention. Repeat, if necessary, for the opposite ear. Do not touch the tip of the dropper to the ear, fingertips, or other surface. Do not rinse the dropper after use. Keep container tightly closed. Talk to your pediatrician regarding the use of this medicine in children. While this drug may be used in children as young as 12 years for selected conditions, precautions do apply. Overdosage: If you think you have taken too much of this medicine contact a poison control center or  emergency room at once. NOTE: This medicine is only for you. Do not share this medicine with others. What if I miss a dose? If you miss a dose, use it as soon as you can. If it is almost time for your next dose, use only that dose. Do not use double or extra doses. What may interact with this medicine? Interactions are not expected. Do not use any other ear products without asking your doctor or health care professional. This list may not describe all possible interactions. Give your health care provider a list of all the medicines, herbs, non-prescription drugs, or dietary supplements you use. Also tell them if you smoke, drink alcohol, or use illegal drugs. Some items may interact with your medicine. What should I watch for while using this medicine? This medicine is not for long-term use. Do not use for more than 4 days without checking with your health care professional. Contact your doctor or health care professional if your condition does not start to get better within a few days or if you notice burning, redness, itching or swelling. What side effects may I notice from receiving this medicine? Side effects that you should report to your doctor or health care professional as soon as possible: -allergic reactions like skin rash, itching or hives, swelling of the face, lips, or tongue -burning, itching, and redness -worsening ear pain -rash Side effects that usually do not require medical attention (report to your doctor or health care professional if they continue or are bothersome): -abnormal sensation  while putting the drops in the ear -temporary reduction in hearing (but not complete loss of hearing) This list may not describe all possible side effects. Call your doctor for medical advice about side effects. You may report side effects to FDA at 1-800-FDA-1088. Where should I keep my medicine? Keep out of the reach of children. Store at room temperature between 15 and 30 degrees C (59  and 86 degrees F) in a tight, light-resistant container. Keep bottle away from excessive heat and direct sunlight. Throw away any unused medicine after the expiration date. NOTE: This sheet is a summary. It may not cover all possible information. If you have questions about this medicine, talk to your doctor, pharmacist, or health care provider.  2013, [employer]. (10/03/2007 2:00:02 PM)  Cerumen Plug A cerumen plug is having too much wax in your ear canal. The outer ear canal is lined with hairs and glands that secrete wax. This wax is called cerumen. This protects the ear canal. It also helps prevent material from entering the ear. Too much wax can cause a feeling of fullness in the ears, decreased hearing, ringing in the ears, or an earache. Sometimes your caregiver will remove a cerumen plug with an instrument called a curette. Or he/she may flush the ear canal with warm water from a syringe to remove the wax. You may simply be sent home to follow the home care instructions below for wax removal. Generally ear wax does not have to be removed unless it is causing a problem such as one of those listed above. When too much wax is causing a problem, the following are a few home remedies which can be used to help this problem. HOME CARE INSTRUCTIONS   Put a couple drops of glycerin, baby oil, or mineral oil in the ear a couple times of day. Do this every day for several days. After putting the drops in, you will need to lay with the affected ear pointing up for a couple minutes. This allows the drops to remain in the canal and run down to the area of wax blockage. This will soften the wax plug. It may also make your hearing worse as the wax softens and blocks the canal even more.  After a couple days, you may gently flush the ear canal with warm water from a syringe. Do this by pulling your ear up and back with your head tilted slightly forward and towards a pan to catch the water. This is most  easily done with a helper. You can also accomplish the same thing by letting the shower beat into your ear canal to wash the wax out. Sometimes this will not be immediately successful. You will have to return to the first step of using the oil to further soften the wax. Then resume washing the ear canal out with a syringe or shower.  Following removal of the wax, put ten to twenty drops of rubbing alcohol into the outer ears. This will dry the canal and prevent an infection.  Do not irrigate or wash out your ears if you have had a perforated ear drum or mastoid surgery. SEEK IMMEDIATE MEDICAL CARE IF:   You are unsuccessful with the above instructions for home care.  You develop ear pain or drainage from the ear. MAKE SURE YOU:   Understand these instructions.  Will watch your condition.  Will get help right away if you are not doing well or get worse. Document Released: 03/16/2001 Document Revised: 09/13/2011  Document Reviewed: 06/12/2008 [facility] Patient Information 2014 [place], [place].

## 2013-01-22 NOTE — Assessment & Plan Note (Addendum)
RN Herbin irrigated both ears with improvement in hearing after irrigation on the left. Right ear with still cerumen after irrigation  Rx Debrox 6.5% otic solution 5 drops b/l ears bid 30 mL no refills and Cortisporin otic suspension 3 drops b/l ears qid 10 ml RF x 1  Return if not improving

## 2013-01-22 NOTE — Assessment & Plan Note (Signed)
BP controlled today 136/84. Continue medications currently taking.

## 2013-05-15 ENCOUNTER — Other Ambulatory Visit: Payer: Self-pay | Admitting: *Deleted

## 2013-05-16 MED ORDER — HYDRALAZINE HCL 100 MG PO TABS: 100.0000 mg | ORAL_TABLET | Freq: Three times a day (TID) | ORAL | Status: DC

## 2013-05-16 NOTE — Telephone Encounter (Signed)
Rx called in 

## 2013-07-30 ENCOUNTER — Other Ambulatory Visit: Payer: Self-pay | Admitting: *Deleted

## 2013-07-31 MED ORDER — HYDRALAZINE HCL 100 MG PO TABS: 100.0000 mg | ORAL_TABLET | Freq: Three times a day (TID) | ORAL | Status: DC

## 2013-07-31 NOTE — Telephone Encounter (Signed)
Rx called in 

## 2013-10-29 ENCOUNTER — Other Ambulatory Visit: Payer: Self-pay | Admitting: *Deleted

## 2013-10-29 MED ORDER — HYDRALAZINE HCL 100 MG PO TABS: 100.0000 mg | ORAL_TABLET | Freq: Three times a day (TID) | ORAL | Status: DC

## 2014-01-09 ENCOUNTER — Encounter: Payer: Self-pay | Admitting: Internal Medicine

## 2014-01-09 ENCOUNTER — Ambulatory Visit (INDEPENDENT_AMBULATORY_CARE_PROVIDER_SITE_OTHER): Payer: BC Managed Care – PPO | Admitting: Internal Medicine

## 2014-01-09 VITALS — BP 168/105 | HR 65 | Temp 97.5°F | Ht >= 80 in | Wt 216.8 lb

## 2014-01-09 DIAGNOSIS — F17200 Nicotine dependence, unspecified, uncomplicated: Secondary | ICD-10-CM

## 2014-01-09 DIAGNOSIS — N529 Male erectile dysfunction, unspecified: Secondary | ICD-10-CM

## 2014-01-09 DIAGNOSIS — Z720 Tobacco use: Secondary | ICD-10-CM

## 2014-01-09 DIAGNOSIS — Z8669 Personal history of other diseases of the nervous system and sense organs: Secondary | ICD-10-CM

## 2014-01-09 DIAGNOSIS — J309 Allergic rhinitis, unspecified: Secondary | ICD-10-CM

## 2014-01-09 DIAGNOSIS — E785 Hyperlipidemia, unspecified: Secondary | ICD-10-CM

## 2014-01-09 DIAGNOSIS — J302 Other seasonal allergic rhinitis: Secondary | ICD-10-CM

## 2014-01-09 DIAGNOSIS — K5730 Diverticulosis of large intestine without perforation or abscess without bleeding: Secondary | ICD-10-CM

## 2014-01-09 DIAGNOSIS — I10 Essential (primary) hypertension: Secondary | ICD-10-CM

## 2014-01-09 DIAGNOSIS — Z0000 Encounter for general adult medical examination without abnormal findings: Secondary | ICD-10-CM | POA: Insufficient documentation

## 2014-01-09 LAB — CBC WITH DIFFERENTIAL/PLATELET
Basophils Absolute: 0 10*3/uL (ref 0.0–0.1)
Basophils Relative: 0 % (ref 0–1)
EOS ABS: 0.1 10*3/uL (ref 0.0–0.7)
EOS PCT: 2 % (ref 0–5)
HEMATOCRIT: 38.6 % — AB (ref 39.0–52.0)
Hemoglobin: 13 g/dL (ref 13.0–17.0)
LYMPHS ABS: 1.6 10*3/uL (ref 0.7–4.0)
Lymphocytes Relative: 28 % (ref 12–46)
MCH: 30.5 pg (ref 26.0–34.0)
MCHC: 33.7 g/dL (ref 30.0–36.0)
MCV: 90.6 fL (ref 78.0–100.0)
MONOS PCT: 10 % (ref 3–12)
Monocytes Absolute: 0.6 10*3/uL (ref 0.1–1.0)
NEUTROS PCT: 60 % (ref 43–77)
Neutro Abs: 3.4 10*3/uL (ref 1.7–7.7)
Platelets: 282 10*3/uL (ref 150–400)
RBC: 4.26 MIL/uL (ref 4.22–5.81)
RDW: 13.5 % (ref 11.5–15.5)
WBC: 5.6 10*3/uL (ref 4.0–10.5)

## 2014-01-09 LAB — COMPLETE METABOLIC PANEL WITH GFR
ALBUMIN: 4.4 g/dL (ref 3.5–5.2)
ALK PHOS: 72 U/L (ref 39–117)
ALT: 28 U/L (ref 0–53)
AST: 33 U/L (ref 0–37)
BUN: 15 mg/dL (ref 6–23)
CALCIUM: 9.2 mg/dL (ref 8.4–10.5)
CHLORIDE: 103 meq/L (ref 96–112)
CO2: 27 meq/L (ref 19–32)
Creat: 1.14 mg/dL (ref 0.50–1.35)
GFR, EST AFRICAN AMERICAN: 78 mL/min
GFR, EST NON AFRICAN AMERICAN: 68 mL/min
GLUCOSE: 92 mg/dL (ref 70–99)
POTASSIUM: 4.2 meq/L (ref 3.5–5.3)
SODIUM: 138 meq/L (ref 135–145)
TOTAL PROTEIN: 6.8 g/dL (ref 6.0–8.3)
Total Bilirubin: 0.6 mg/dL (ref 0.2–1.2)

## 2014-01-09 LAB — LIPID PANEL
Cholesterol: 182 mg/dL (ref 0–200)
HDL: 67 mg/dL (ref >39)
LDL CALC: 105 mg/dL — AB (ref 0–99)
Total CHOL/HDL Ratio: 2.7 Ratio
Triglycerides: 50 mg/dL (ref <150)
VLDL: 10 mg/dL (ref 0–40)

## 2014-01-09 MED ORDER — HYDRALAZINE HCL 100 MG PO TABS: 100.0000 mg | ORAL_TABLET | Freq: Three times a day (TID) | ORAL | Status: DC

## 2014-01-09 MED ORDER — FLUTICASONE PROPIONATE 50 MCG/ACT NA SUSP: 2.0000 | Freq: Every day | NASAL | Status: DC

## 2014-01-09 MED ORDER — TRIAMTERENE-HCTZ 37.5-25 MG PO CAPS: 1.0000 | ORAL_CAPSULE | ORAL | Status: DC

## 2014-01-09 NOTE — Assessment & Plan Note (Signed)
Will check lipid panel today 

## 2014-01-09 NOTE — Assessment & Plan Note (Signed)
Due for colonoscopy in 2018

## 2014-01-09 NOTE — Assessment & Plan Note (Signed)
  Assessment: Progress toward smoking cessation:  smoking the same amount Barriers to progress toward smoking cessation:   (duration) Comments: smoking 10 cig per day, patch causes itching  Plan: Instruction/counseling given:  I counseled patient on the dangers of tobacco use, advised patient to stop smoking, and reviewed strategies to maximize success. Educational resources provided:  [employer]) brochure Self management tools provided: yes  Medications to assist with smoking cessation:  None Patient agreed to the following self-care plans for smoking cessation:    Other plans: reassess at f/u

## 2014-01-09 NOTE — Assessment & Plan Note (Addendum)
BP Readings from Last 3 Encounters:  01/09/14 168/105  01/22/13 136/84  01/03/13 138/83    Lab Results  Component Value Date   NA 139 11/06/2012   K 4.0 11/06/2012   CREATININE 1.01 11/06/2012    Assessment: Blood pressure control: mildly elevated Progress toward BP goal:  deteriorated Comments: has not taken BP medications today   Plan: Medications:  continue current medications (Rx refill of Hydralazine 100 mg tid, Dyazide 37.5-25 mg qd) #90 d supply x 4  Educational resources provided: brochure Self management tools provided: home blood pressure logbook Other plans: CMET, lipid today f/u in 6-12 months

## 2014-01-09 NOTE — Progress Notes (Signed)
   Subjective:    Patient ID: John Keith, male    DOB: Oct 03, 1950, 63 y.o.   MRN: [id number]  HPI Comments: 63 y.o PMH (HTN BP  151/97 w/o meds today), HLD, tobacco abuse, ED (followed with urology and they gave him testosterone injections), diverticulosis, carpal tunnel-left, allergic rhinitis, GERD, DDD thoracic spine  He presents today for f/u  1) BP uncontrolled.  He has not taken his BP medications today. He only slept 2 hours today due to working and was rushing to get to his MD appt. He requests #90 day supply of medication (Hydralazine and Triamterene-HCTZ).    2) C/o left hand numbness in thumb, 1st, 2nd fingers for a while at least since 2014.  He has numbness at night, aching and has even dropped mail out of his hand due to decreased grip/numbness.  He does not have problems opening jars.  He only uses wrist splint once a month.  He is right hand dominant  3) He c/o nasal congestion with exposure to environmental allergens and request nose spray.  He cant smell today.  Tried Zyrtec but did not help with nasal congestion  HM: colonoscopy due 2018; smoking 10 cigarettes daily x 30 years          Review of Systems  HENT: Positive for congestion.   Respiratory: Negative for shortness of breath.   Cardiovascular: Negative for chest pain.  Gastrointestinal: Negative for blood in stool.  Genitourinary: Negative for hematuria.  Allergic/Immunologic: Positive for environmental allergies.  Neurological: Positive for numbness.       Objective:   Physical Exam  Nursing note and vitals reviewed. Constitutional: He is oriented to person, place, and time. He appears well-developed and well-nourished. He is cooperative.  HENT:  Head: Normocephalic and atraumatic.  Mouth/Throat: Oropharynx is clear and moist and mucous membranes are normal. No oropharyngeal exudate.  Eyes: Conjunctivae are normal. Pupils are equal, round, and reactive to light. Right eye exhibits no discharge.  Left eye exhibits no discharge. No scleral icterus.  Cardiovascular: Normal rate, regular rhythm, S1 normal, S2 normal and normal heart sounds.   No murmur heard. No lower ext edema   Pulmonary/Chest: Effort normal and breath sounds normal.  Abdominal: Soft. Bowel sounds are normal. He exhibits no distension. There is no tenderness.  Musculoskeletal: He exhibits no edema.  5/5 hand grip on the left    Neurological: He is alert and oriented to person, place, and time. Gait normal.  Skin: Skin is warm, dry and intact. No rash noted.  Psychiatric: He has a normal mood and affect. His speech is normal and behavior is normal. Judgment and thought content normal.          Assessment & Plan:  F/u in 6-12 months

## 2014-01-09 NOTE — Assessment & Plan Note (Signed)
Will refer to sports medicine for further w/u or possible steroid inj pt prefers appt early August

## 2014-01-09 NOTE — Patient Instructions (Addendum)
General Instructions: Please follow up in 6-12 months I will send you a copy of your labs or call if something abnormal Wear your wrist splint at night I will refer you to sports medicine for your left hand' Take care  Follow up in 6-12 months, sooner if needed    Treatment Goals:  Goals (1 Years of Data) as of 01/09/14         As of Today 01/22/13 01/03/13     Blood Pressure    . Blood Pressure < 140/90  151/97 136/84 138/83      Progress Toward Treatment Goals:  Treatment Goal 01/09/2014  Blood pressure deteriorated  Stop smoking smoking the same amount    Self Care Goals & Plans:  Self Care Goal 01/09/2014  Manage my medications take my medicines as prescribed; bring my medications to every visit; refill my medications on time  Monitor my health keep track of my blood pressure  Eat healthy foods drink diet soda or water instead of juice or soda; eat more vegetables; eat foods that are low in salt; eat baked foods instead of fried foods; eat fruit for snacks and desserts; eat smaller portions  Be physically active find an activity I enjoy  Stop smoking -  Meeting treatment goals maintain the current self-care plan    No flowsheet data found.   Care Management & Community Referrals:  Referral 01/09/2014  Referrals made for care management support none needed  Referrals made to community resources none       Hypertension Hypertension, commonly called high blood pressure, is when the force of blood pumping through your arteries is too strong. Your arteries are the blood vessels that carry blood from your heart throughout your body. A blood pressure reading consists of a higher number over a lower number, such as 110/72. The higher number (systolic) is the pressure inside your arteries when your heart pumps. The lower number (diastolic) is the pressure inside your arteries when your heart relaxes. Ideally you want your blood pressure below 120/80. Hypertension forces your heart  to work harder to pump blood. Your arteries may become narrow or stiff. Having hypertension puts you at risk for heart disease, stroke, and other problems.  RISK FACTORS Some risk factors for high blood pressure are controllable. Others are not.  Risk factors you cannot control include:   Race. You may be at higher risk if you are African American.  Age. Risk increases with age.  Gender. Men are at higher risk than women before age 26 years. After age 47, women are at higher risk than men. Risk factors you can control include:  Not getting enough exercise or physical activity.  Being overweight.  Getting too much fat, sugar, calories, or salt in your diet.  Drinking too much alcohol. SIGNS AND SYMPTOMS Hypertension does not usually cause signs or symptoms. Extremely high blood pressure (hypertensive crisis) may cause headache, anxiety, shortness of breath, and nosebleed. DIAGNOSIS  To check if you have hypertension, your health care provider will measure your blood pressure while you are seated, with your arm held at the level of your heart. It should be measured at least twice using the same arm. Certain conditions can cause a difference in blood pressure between your right and left arms. A blood pressure reading that is higher than normal on one occasion does not mean that you need treatment. If one blood pressure reading is high, ask your health care provider about having it checked again. TREATMENT  Treating high blood pressure includes making lifestyle changes and possibly taking medication. Living a healthy lifestyle can help lower high blood pressure. You may need to change some of your habits. Lifestyle changes may include:  Following the DASH diet. This diet is high in fruits, vegetables, and whole grains. It is low in salt, red meat, and added sugars.  Getting at least 2 1/2 hours of brisk physical activity every week.  Losing weight if necessary.  Not smoking.  Limiting  alcoholic beverages.  Learning ways to reduce stress. If lifestyle changes are not enough to get your blood pressure under control, your health care provider may prescribe medicine. You may need to take more than one. Work closely with your health care provider to understand the risks and benefits. HOME CARE INSTRUCTIONS  Have your blood pressure rechecked as directed by your health care provider.   Only take medicine as directed by your health care provider. Follow the directions carefully. Blood pressure medicines must be taken as prescribed. The medicine does not work as well when you skip doses. Skipping doses also puts you at risk for problems.   Do not smoke.   Monitor your blood pressure at home as directed by your health care provider. SEEK MEDICAL CARE IF:   You think you are having a reaction to medicines taken.  You have recurrent headaches or feel dizzy.  You have swelling in your ankles.  You have trouble with your vision. SEEK IMMEDIATE MEDICAL CARE IF:  You develop a severe headache or confusion.  You have unusual weakness, numbness, or feel faint.  You have severe chest or abdominal pain.  You vomit repeatedly.  You have trouble breathing. MAKE SURE YOU:   Understand these instructions.  Will watch your condition.  Will get help right away if you are not doing well or get worse. Document Released: 06/21/2005 Document Revised: 06/26/2013 Document Reviewed: 04/13/2013 [facility] Patient Information 2015 [place], [place]. This information is not intended to replace advice given to you by your health care provider. Make sure you discuss any questions you have with your health care provider. Smoking Cessation Quitting smoking is important to your health and has many advantages. However, it is not always easy to quit since nicotine is a very addictive drug. Often times, people try 3 times or more before being able to quit. This document explains the best ways  for you to prepare to quit smoking. Quitting takes hard work and a lot of effort, but you can do it. ADVANTAGES OF QUITTING SMOKING  You will live longer, feel better, and live better.  Your body will feel the impact of quitting smoking almost immediately.  Within 20 minutes, blood pressure decreases. Your pulse returns to its normal level.  After 8 hours, carbon monoxide levels in the blood return to normal. Your oxygen level increases.  After 24 hours, the chance of having a heart attack starts to decrease. Your breath, hair, and body stop smelling like smoke.  After 48 hours, damaged nerve endings begin to recover. Your sense of taste and smell improve.  After 72 hours, the body is virtually free of nicotine. Your bronchial tubes relax and breathing becomes easier.  After 2 to 12 weeks, lungs can hold more air. Exercise becomes easier and circulation improves.  The risk of having a heart attack, stroke, cancer, or lung disease is greatly reduced.  After 1 year, the risk of coronary heart disease is cut in half.  After 5 years, the risk  of stroke falls to the same as a nonsmoker.  After 10 years, the risk of lung cancer is cut in half and the risk of other cancers decreases significantly.  After 15 years, the risk of coronary heart disease drops, usually to the level of a nonsmoker.  If you are pregnant, quitting smoking will improve your chances of having a healthy baby.  The people you live with, especially any children, will be healthier.  You will have extra money to spend on things other than cigarettes. QUESTIONS TO THINK ABOUT BEFORE ATTEMPTING TO QUIT You may want to talk about your answers with your caregiver.  Why do you want to quit?  If you tried to quit in the past, what helped and what did not?  What will be the most difficult situations for you after you quit? How will you plan to handle them?  Who can help you through the tough times? Your family? Friends?  A caregiver?  What pleasures do you get from smoking? What ways can you still get pleasure if you quit? Here are some questions to ask your caregiver:  How can you help me to be successful at quitting?  What medicine do you think would be best for me and how should I take it?  What should I do if I need more help?  What is smoking withdrawal like? How can I get information on withdrawal? GET READY  Set a quit date.  Change your environment by getting rid of all cigarettes, ashtrays, matches, and lighters in your home, car, or work. Do not let people smoke in your home.  Review your past attempts to quit. Think about what worked and what did not. GET SUPPORT AND ENCOURAGEMENT You have a better chance of being successful if you have help. You can get support in many ways.  Tell your family, friends, and co-workers that you are going to quit and need their support. Ask them not to smoke around you.  Get individual, group, or telephone counseling and support. Programs are available at [facility]. Call your local health department for information about programs in your area.  Spiritual beliefs and practices may help some smokers quit.  Download a "quit meter" on your computer to keep track of quit statistics, such as how long you have gone without smoking, cigarettes not smoked, and money saved.  Get a self-help book about quitting smoking and staying off of tobacco. LEARN NEW SKILLS AND BEHAVIORS  Distract yourself from urges to smoke. Talk to someone, go for a walk, or occupy your time with a task.  Change your normal routine. Take a different route to work. Drink tea instead of coffee. Eat breakfast in a different place.  Reduce your stress. Take a hot bath, exercise, or read a book.  Plan something enjoyable to do every day. Reward yourself for not smoking.  Explore interactive web-based programs that specialize in helping you quit. GET MEDICINE AND  USE IT CORRECTLY Medicines can help you stop smoking and decrease the urge to smoke. Combining medicine with the above behavioral methods and support can greatly increase your chances of successfully quitting smoking.  Nicotine replacement therapy helps deliver nicotine to your body without the negative effects and risks of smoking. Nicotine replacement therapy includes nicotine gum, lozenges, inhalers, nasal sprays, and skin patches. Some may be available over-the-counter and others require a prescription.  Antidepressant medicine helps people abstain from smoking, but how this works is unknown. This medicine  is available by prescription.  Nicotinic receptor partial agonist medicine simulates the effect of nicotine in your brain. This medicine is available by prescription. Ask your caregiver for advice about which medicines to use and how to use them based on your health history. Your caregiver will tell you what side effects to look out for if you choose to be on a medicine or therapy. Carefully read the information on the package. Do not use any other product containing nicotine while using a nicotine replacement product.  RELAPSE OR DIFFICULT SITUATIONS Most relapses occur within the first 3 months after quitting. Do not be discouraged if you start smoking again. Remember, most people try several times before finally quitting. You may have symptoms of withdrawal because your body is used to nicotine. You may crave cigarettes, be irritable, feel very hungry, cough often, get headaches, or have difficulty concentrating. The withdrawal symptoms are only temporary. They are strongest when you first quit, but they will go away within 10-14 days. To reduce the chances of relapse, try to:  Avoid drinking alcohol. Drinking lowers your chances of successfully quitting.  Reduce the amount of caffeine you consume. Once you quit smoking, the amount of caffeine in your body increases and can give you symptoms,  such as a rapid heartbeat, sweating, and anxiety.  Avoid smokers because they can make you want to smoke.  Do not let weight gain distract you. Many smokers will gain weight when they quit, usually less than 10 pounds. Eat a healthy diet and stay active. You can always lose the weight gained after you quit.  Find ways to improve your mood other than smoking. FOR MORE INFORMATION  www.smokefree.gov  Document Released: 06/15/2001 Document Revised: 12/21/2011 Document Reviewed: 09/30/2011 [facility] Patient Information 2015 [place], [place]. This information is not intended to replace advice given to you by your health care provider. Make sure you discuss any questions you have with your health care provider.    Carpal Tunnel Release Carpal tunnel release is done to relieve the pressure on the nerves and tendons on the bottom side of your wrist.  LET YOUR CAREGIVER KNOW ABOUT:   Allergies to food or medicine.  Medicines taken, including vitamins, herbs, eyedrops, over-the-counter medicines, and creams.  Use of steroids (by mouth or creams).  Previous problems with anesthetics or numbing medicines.  History of bleeding problems or blood clots.  Previous surgery.  Other health problems, including diabetes and kidney problems.  Possibility of pregnancy, if this applies. RISKS AND COMPLICATIONS  Some problems that may happen after this procedure include:  Infection.  Damage to the nerves, arteries or tendons could occur. This would be very uncommon.  Bleeding. BEFORE THE PROCEDURE   This surgery may be done while you are asleep (general anesthetic) or may be done under a block where only your forearm and the surgical area is numb.  If the surgery is done under a block, the numbness will gradually wear off within several hours after surgery. HOME CARE INSTRUCTIONS   Have a responsible person with you for 24 hours.  Do not drive a car or use public transportation for 24  hours.  Only take over-the-counter or prescription medicines for pain, discomfort, or fever as directed by your caregiver. Take them as directed.  You may put ice on the palm side of the affected wrist.  Put ice in a plastic bag.  Place a towel between your skin and the bag.  Leave the ice on for 20 to 30  minutes, 4 times per day.  If you were given a splint to keep your wrist from bending, use it as directed. It is important to wear the splint at night or as directed. Use the splint for as long as you have pain or numbness in your hand, arm, or wrist. This may take 1 to 2 months.  Keep your hand raised (elevated) above the level of your heart as much as possible. This keeps swelling down and helps with discomfort.  Change bandages (dressings) as directed.  Keep the wound clean and dry. SEEK MEDICAL CARE IF:   You develop pain not relieved with medications.  You develop numbness of your hand.  You develop bleeding from your surgical site.  You have an oral temperature above 102 F (38.9 C).  You develop redness or swelling of the surgical site.  You develop new, unexplained problems. SEEK IMMEDIATE MEDICAL CARE IF:   You develop a rash.  You have difficulty breathing.  You develop any reaction or side effects to medications given. Document Released: 09/11/2003 Document Revised: 09/13/2011 Document Reviewed: 04/27/2007 [facility] Patient Information 2015 [place], [place]. This information is not intended to replace advice given to you by your health care provider. Make sure you discuss any questions you have with your health care provider.

## 2014-01-09 NOTE — Assessment & Plan Note (Signed)
Having nasal congestion  Will try Flonase prn

## 2014-01-09 NOTE — Assessment & Plan Note (Signed)
Followed with urology who gave him testosterone inj

## 2014-01-09 NOTE — Assessment & Plan Note (Signed)
Due for colonoscopy 2018 Will check cbc today

## 2014-01-10 ENCOUNTER — Encounter: Payer: Self-pay | Admitting: Internal Medicine

## 2014-01-14 NOTE — Progress Notes (Signed)
Case discussed with Dr. McLean at the time of the visit.  We reviewed the resident's history and exam and pertinent patient test results.  I agree with the assessment, diagnosis, and plan of care documented in the resident's note.     

## 2014-01-25 ENCOUNTER — Encounter: Payer: Self-pay | Admitting: Family Medicine

## 2014-01-25 ENCOUNTER — Ambulatory Visit (INDEPENDENT_AMBULATORY_CARE_PROVIDER_SITE_OTHER): Payer: BC Managed Care – PPO | Admitting: Family Medicine

## 2014-01-25 VITALS — BP 147/88 | HR 64 | Ht >= 80 in | Wt 220.0 lb

## 2014-01-25 DIAGNOSIS — G5602 Carpal tunnel syndrome, left upper limb: Secondary | ICD-10-CM

## 2014-01-25 DIAGNOSIS — G5600 Carpal tunnel syndrome, unspecified upper limb: Secondary | ICD-10-CM

## 2014-01-25 NOTE — Addendum Note (Signed)
Addended by: Linward Headland on: 01/25/2014 09:36 AM   Modules accepted: Orders

## 2014-01-25 NOTE — Progress Notes (Signed)
Patient ID: John Keith, male   DOB: January 14, 1951, 63 y.o.   MRN: [id number]  John Keith - 63 y.o. male MRN [id number]  Date of birth: August 19, 1950    SUBJECTIVE:     Left hand numbness and weakness. Has had problems for the last couple of years. Was diagnosed by his PCP is having left-sided carpal tunnel. He is right-hand dominant. Symptoms are pain and numbness in left thumb index and long finger. Weakness noted when he tries to grasp things or pick up items such as male. Has lancinating pain in the same area that occasionally awakens him from sleep. He is tried a brace in the past but has not worn it consistently at night and currently wears it only once or twice a month. It never seemed to help much. He has no symptoms in his right hand. ROS:     See history of present illness above. Additional pertinent review of systems is negative for fever, sweats, chills, unusual weight change. No unusual arthralgias. No rash noted on his left hand or left upper extremity. Has had no erythema of the wrist and no wrist pain on the left.  PERTINENT  PMH / PSH FH / / SH:  Past Medical, Surgical, Social, and Family History Reviewed & Updated in the EMR.  Pertinent findings include:  DJD thoracolumbar spine  Hypertension GERD Tobacco abuse  OBJECTIVE: BP 147/88  Pulse 64  Ht 6\' 9"  (2.057 m)  Wt 220 lb (99.791 kg)  BMI 23.58 kg/m2  Physical Exam:  Vital signs are reviewed. Vital signs are reviewed GENERAL: Well-developed very tall male no acute distress  Wrists: Bilaterally symmetrical. Full range of motion flexion and extension. No tenderness to palpation.  Hand: Left. Noted flattening of the thenar eminence with mild weakness of thumb in thumb opposition noted in testing of pincer grasp. Skin: No skin changes bilateral wrist or hand VASCULAR: 2+ bilaterally equal radial pulses. Normal capillary refill bilateral hand. NEURO: Intact sensation soft touch bilateral hand ULTRASOUND: Left median nerve  is noted to be quite flattened in cross-sectional view. There is no surrounding edema. The flexor tendons in the hand compartments appear to be normal  ASSESSMENT & PLAN:  See problem based charting & AVS for pt instructions.

## 2014-01-25 NOTE — Assessment & Plan Note (Signed)
Clinical exam, symptoms, ultrasound imaging all consistent with moderate to severe carpal tunnel syndrome. Given the length of time he has had this in the amount of thenar atrophy, I think it's best to proceed with nerve conduction studies initially rather than retry conservative therapy which she has failed previously.Maryclare Labrador set those up. Should he have moderate to severe conduction delay as I suspect he will, we discussed possibility of setting him up with surgeon for evaluation and he is in favor of that.

## 2014-05-13 ENCOUNTER — Encounter: Payer: Self-pay | Admitting: Internal Medicine

## 2014-07-24 ENCOUNTER — Other Ambulatory Visit: Payer: Self-pay | Admitting: *Deleted

## 2014-07-24 DIAGNOSIS — I10 Essential (primary) hypertension: Secondary | ICD-10-CM

## 2014-07-25 MED ORDER — HYDRALAZINE HCL 100 MG PO TABS: 100.0000 mg | ORAL_TABLET | Freq: Three times a day (TID) | ORAL | Status: DC

## 2014-08-06 ENCOUNTER — Encounter: Payer: Self-pay | Admitting: Internal Medicine

## 2014-09-23 ENCOUNTER — Other Ambulatory Visit: Payer: Self-pay | Admitting: *Deleted

## 2014-09-23 DIAGNOSIS — I10 Essential (primary) hypertension: Secondary | ICD-10-CM

## 2014-09-23 MED ORDER — HYDRALAZINE HCL 100 MG PO TABS: 100.0000 mg | ORAL_TABLET | Freq: Three times a day (TID) | ORAL | Status: DC

## 2014-11-20 ENCOUNTER — Encounter: Payer: Self-pay | Admitting: *Deleted

## 2014-11-28 ENCOUNTER — Other Ambulatory Visit: Payer: Self-pay | Admitting: Internal Medicine

## 2014-12-09 ENCOUNTER — Encounter: Payer: Self-pay | Admitting: Internal Medicine

## 2014-12-09 ENCOUNTER — Ambulatory Visit (INDEPENDENT_AMBULATORY_CARE_PROVIDER_SITE_OTHER): Payer: BLUE CROSS/BLUE SHIELD | Admitting: Internal Medicine

## 2014-12-09 VITALS — BP 141/97 | HR 70 | Temp 98.5°F | Ht >= 80 in | Wt 218.8 lb

## 2014-12-09 DIAGNOSIS — J302 Other seasonal allergic rhinitis: Secondary | ICD-10-CM

## 2014-12-09 DIAGNOSIS — I10 Essential (primary) hypertension: Secondary | ICD-10-CM

## 2014-12-09 LAB — BASIC METABOLIC PANEL WITH GFR
BUN: 16 mg/dL (ref 6–23)
CO2: 24 mEq/L (ref 19–32)
CREATININE: 0.96 mg/dL (ref 0.50–1.35)
Calcium: 9 mg/dL (ref 8.4–10.5)
Chloride: 105 mEq/L (ref 96–112)
GFR, Est Non African American: 83 mL/min
GLUCOSE: 94 mg/dL (ref 70–99)
POTASSIUM: 4.2 meq/L (ref 3.5–5.3)
SODIUM: 140 meq/L (ref 135–145)

## 2014-12-09 LAB — LIPID PANEL
CHOL/HDL RATIO: 2.2 ratio
Cholesterol: 177 mg/dL (ref 0–200)
HDL: 81 mg/dL (ref >=40)
LDL CALC: 83 mg/dL (ref 0–99)
TRIGLYCERIDES: 66 mg/dL (ref <150)
VLDL: 13 mg/dL (ref 0–40)

## 2014-12-09 MED ORDER — HYDROCHLOROTHIAZIDE 25 MG PO TABS: 25.0000 mg | ORAL_TABLET | Freq: Every day | ORAL | Status: DC

## 2014-12-09 MED ORDER — FLUTICASONE PROPIONATE 50 MCG/ACT NA SUSP: 2.0000 | Freq: Every day | NASAL | Status: DC

## 2014-12-09 NOTE — Assessment & Plan Note (Addendum)
BP Readings from Last 3 Encounters:  12/09/14 141/97  01/25/14 147/88  01/09/14 168/105    Lab Results  Component Value Date   NA 138 01/09/2014   K 4.2 01/09/2014   CREATININE 1.14 01/09/2014    Assessment: Blood pressure control: mildly elevated Progress toward BP goal:  unchanged Comments: Does not take the Dyazide since last year. Often takes Hydralazine twice a day.   Plan: Medications: Stop Hydralazine 100 mg tid and Dyazide 37.5-25 mg qd (not very compliant). Start HCTZ 25 mg daily. I believe HCTZ once a day will be better than Hydralazine which he has to take multiple times a day. His blood pressure is only mildly elevated, so I think monotherapy with the thiazide will be adequate for his BP control. Discussed with him side effects. Educational resources provided: brochure, handout, video Self management tools provided:   Other plans: BMET and lipid panel today  Provided him with information on thiazide Follow up in 2-3 weeks for BP check and adjust medications as needed.

## 2014-12-09 NOTE — Progress Notes (Signed)
Patient ID: John Keith, male   DOB: 1951/01/25, 64 y.o.   MRN: [id number]   Subjective:   HPI: [name] is a 64 y.o. gentleman with past medical history as listed below who presents for an acute visit.  Reason(s) for visit:  1. Ankle pain: He states that he has mild pain and a bruise at the left achilles area. Pain followed came after he accidentally hit his foot against a metallic object at work a few days ago. No fractures. No fever. He has been walking without difficulties. He feels that it is getting better but he just wanted me to take a look at it. No interventions needed at this time. 2. Sinusitis: Has seasonal allergies with sinusitis. He has been using his old Flonase nasal spray which was prescribed last year and it is been helping some. He needs a refill 3. HTN: BP 141/97. Per his chart and PCP note, he is supposed to be taking Hydralazine 100 mg tid and Hyazide 37.5-25 once daily. He states that he takes only the Hydralazine and sometimes only bid as opposed to tid. He does not take Hyazide and he does not even recognize this medication.    Past Medical History  Diagnosis Date  . Arthritis   . GERD (gastroesophageal reflux disease)   . Hypertension   . Allergy   . Smoking     smoking since age 64 y.o  . ED (erectile dysfunction)     02/2011   . DDD (degenerative disc disease), thoracolumbar     unknown location   . DDD (degenerative disc disease), thoracolumbar     multilevel  . Hyperlipidemia   . Lactose intolerance   . Diverticulosis     left colon (2008)   . H. pylori infection   . Degenerative joint disease of left shoulder   . Degenerative joint disease of left shoulder     02/2011   . Vitamin D deficiency   . Carpal tunnel syndrome on left     ROS: Constitutional:  Denies fevers, chills, diaphoresis, appetite change and fatigue.  Respiratory: Denies SOB, DOE, cough, chest tightness, and wheezing.  CVS: No chest pain, palpitations and leg  swelling.  GI: No abdominal pain, nausea, vomiting, bloody stools GU: No dysuria, frequency, hematuria, or flank pain.  MSK: No myalgias, back pain, joint swelling, arthralgias  Psych: No depression symptoms. No SI or SA.    Objective:  Physical Exam: Filed Vitals:   12/09/14 0828  BP: 141/97  Pulse: 70  Temp: 98.5 F (36.9 C)  TempSrc: Oral  Height: 6\' 9"  (2.057 m)  Weight: 218 lb 12.8 oz (99.247 kg)  SpO2: 99%   General: Well nourished. No acute distress.  HEENT: Normal oral mucosa. MMM.  Lungs: CTA bilaterally. No wheezing. Heart: RRR; no extra sounds or murmurs  Abdomen: Non-distended, normal bowel sounds, soft, nontender; no hepatosplenomegaly  Extremities: small bruise (0.5cm) at the left achilles tendon area. Does not look infected. Only mildly tender. Ankle joint is normal. No pedal edema. No joint swelling or tenderness. Neurologic: Normal EOM,  Alert and oriented x3. No obvious neurologic/cranial nerve deficits.  Assessment & Plan:  Discussed case with Dr Dalphine Handing See problem based charting for assessment and plan.

## 2014-12-09 NOTE — Progress Notes (Signed)
Internal Medicine Clinic Attending  Case discussed with Dr. Kazibwe at the time of the visit.  We reviewed the resident's history and exam and pertinent patient test results.  I agree with the assessment, diagnosis, and plan of care documented in the resident's note. 

## 2014-12-09 NOTE — Patient Instructions (Addendum)
General Instructions: Please stop taking Hydralazine and Hyazide Please start taking Hydrochlorthiazide 25 mg daily  We will check your blood work today  Please come back in 2-3 weeks to check your blood pressure  Please bring your medicines with you each time you come to clinic.  Medicines may include prescription medications, over-the-counter medications, herbal remedies, eye drops, vitamins, or other pills.   Progress Toward Treatment Goals:  Treatment Goal 12/09/2014  Blood pressure unchanged  Stop smoking -    Self Care Goals & Plans:  Self Care Goal 12/09/2014  Manage my medications take my medicines as prescribed; bring my medications to every visit; refill my medications on time; follow the sick day instructions if I am sick  Monitor my health -  Eat healthy foods eat more vegetables; eat fruit for snacks and desserts; eat foods that are low in salt  Be physically active find an activity I enjoy  Stop smoking -  Meeting treatment goals -    No flowsheet data found.   Care Management & Community Referrals:  Referral 01/09/2014  Referrals made for care management support none needed  Referrals made to community resources none        Hydrochlorothiazide, HCTZ capsules or tablets What is this medicine? HYDROCHLOROTHIAZIDE (hye droe klor oh THYE a zide) is a diuretic. It increases the amount of urine passed, which causes the body to lose salt and water. This medicine is used to treat high blood pressure. It is also reduces the swelling and water retention caused by various medical conditions, such as heart, liver, or kidney disease. This medicine may be used for other purposes; ask your health care provider or pharmacist if you have questions. COMMON BRAND NAME(S): Esidrix, Ezide, HydroDIURIL, Microzide, Oretic, Zide What should I tell my health care provider before I take this medicine? They need to know if you have any of these conditions: -diabetes -gout -immune  system problems, like lupus -kidney disease or kidney stones -liver disease -pancreatitis -small amount of urine or difficulty passing urine -an unusual or allergic reaction to hydrochlorothiazide, sulfa drugs, other medicines, foods, dyes, or preservatives -pregnant or trying to get pregnant -breast-feeding How should I use this medicine? Take this medicine by mouth with a glass of water. Follow the directions on the prescription label. Take your medicine at regular intervals. Remember that you will need to pass urine frequently after taking this medicine. Do not take your doses at a time of day that will cause you problems. Do not stop taking your medicine unless your doctor tells you to. Talk to your pediatrician regarding the use of this medicine in children. Special care may be needed. Overdosage: If you think you have taken too much of this medicine contact a poison control center or emergency room at once. NOTE: This medicine is only for you. Do not share this medicine with others. What if I miss a dose? If you miss a dose, take it as soon as you can. If it is almost time for your next dose, take only that dose. Do not take double or extra doses. What may interact with this medicine? -cholestyramine -colestipol -digoxin -dofetilide -lithium -medicines for blood pressure -medicines for diabetes -medicines that relax muscles for surgery -other diuretics -steroid medicines like prednisone or cortisone This list may not describe all possible interactions. Give your health care provider a list of all the medicines, herbs, non-prescription drugs, or dietary supplements you use. Also tell them if you smoke, drink alcohol, or use  illegal drugs. Some items may interact with your medicine. What should I watch for while using this medicine? Visit your doctor or health care professional for regular checks on your progress. Check your blood pressure as directed. Ask your doctor or health care  professional what your blood pressure should be and when you should contact him or her. You may need to be on a special diet while taking this medicine. Ask your doctor. Check with your doctor or health care professional if you get an attack of severe diarrhea, nausea and vomiting, or if you sweat a lot. The loss of too much body fluid can make it dangerous for you to take this medicine. You may get drowsy or dizzy. Do not drive, use machinery, or do anything that needs mental alertness until you know how this medicine affects you. Do not stand or sit up quickly, especially if you are an older patient. This reduces the risk of dizzy or fainting spells. Alcohol may interfere with the effect of this medicine. Avoid alcoholic drinks. This medicine may affect your blood sugar level. If you have diabetes, check with your doctor or health care professional before changing the dose of your diabetic medicine. This medicine can make you more sensitive to the sun. Keep out of the sun. If you cannot avoid being in the sun, wear protective clothing and use sunscreen. Do not use sun lamps or tanning beds/booths. What side effects may I notice from receiving this medicine? Side effects that you should report to your doctor or health care professional as soon as possible: -allergic reactions such as skin rash or itching, hives, swelling of the lips, mouth, tongue, or throat -changes in vision -chest pain -eye pain -fast or irregular heartbeat -feeling faint or lightheaded, falls -gout attack -muscle pain or cramps -pain or difficulty when passing urine -pain, tingling, numbness in the hands or feet -redness, blistering, peeling or loosening of the skin, including inside the mouth -unusually weak or tired Side effects that usually do not require medical attention (report to your doctor or health care professional if they continue or are bothersome): -change in sex drive or performance -dry  mouth -headache -stomach upset This list may not describe all possible side effects. Call your doctor for medical advice about side effects. You may report side effects to FDA at 1-800-FDA-1088. Where should I keep my medicine? Keep out of the reach of children. Store at room temperature between 15 and 30 degrees C (59 and 86 degrees F). Do not freeze. Protect from light and moisture. Keep container closed tightly. Throw away any unused medicine after the expiration date. NOTE: This sheet is a summary. It may not cover all possible information. If you have questions about this medicine, talk to your doctor, pharmacist, or health care provider.  2015, [employer] Standard. (2010-02-13 12:57:37)

## 2014-12-09 NOTE — Assessment & Plan Note (Signed)
Having nasal congestion  Refill try Flonase prn. It has been helpful in the past.

## 2014-12-19 ENCOUNTER — Telehealth: Payer: Self-pay | Admitting: Internal Medicine

## 2014-12-19 NOTE — Telephone Encounter (Signed)
Call to patient to confirm appointment for 12/23/14 at 8:45 lmtcb ° °

## 2014-12-23 ENCOUNTER — Encounter: Payer: Self-pay | Admitting: Internal Medicine

## 2014-12-23 ENCOUNTER — Ambulatory Visit (INDEPENDENT_AMBULATORY_CARE_PROVIDER_SITE_OTHER): Payer: BLUE CROSS/BLUE SHIELD | Admitting: Internal Medicine

## 2014-12-23 VITALS — BP 148/90 | HR 69 | Temp 97.9°F | Ht >= 80 in | Wt 218.9 lb

## 2014-12-23 DIAGNOSIS — E785 Hyperlipidemia, unspecified: Secondary | ICD-10-CM

## 2014-12-23 DIAGNOSIS — I10 Essential (primary) hypertension: Secondary | ICD-10-CM

## 2014-12-23 MED ORDER — ATORVASTATIN CALCIUM 20 MG PO TABS: 20.0000 mg | ORAL_TABLET | Freq: Every day | ORAL | Status: DC

## 2014-12-23 NOTE — Progress Notes (Signed)
   Subjective:   Patient ID: John Keith male   DOB: 1950-07-22 64 y.o.   MRN: [id number]  HPI: John Keith is a 64 y.o. male w/ PMHx of HTN, HLD, GERD, DDD, and tobacco abuse, presents to the clinic today for a follow-up visit regarding his blood pressure. Patient was last seen on 12/09/14 at which time his BP regimen was drastically changed. He had been taking Hydralazine and Dyazide w/ poor compliance previously, changed to HCTZ 25 mg daily. He states that sine he started taking this medication he has been feeling much better. BP is very mildly elevated today, but admits to missing his dose this morning. No polyuria, dehydration, dizziness, lightheadedness, palpitations, or fatigue. Patient still smokes 1/2 pack per day.    Current Outpatient Prescriptions  Medication Sig Dispense Refill  . aspirin 81 MG tablet Take 1 tablet (81 mg total) by mouth daily. 30 tablet 11  . Calcium Carb-Cholecalciferol 600-800 MG-UNIT TABS Take 600-800 mg by mouth daily. 2 tablets daily    . fluticasone (FLONASE) 50 MCG/ACT nasal spray Place 2 sprays into both nostrils daily. 16 g 2  . hydrochlorothiazide (HYDRODIURIL) 25 MG tablet Take 1 tablet (25 mg total) by mouth daily. 30 tablet 2   No current facility-administered medications for this visit.   Review of Systems  General: Denies fever, diaphoresis, appetite change, and fatigue.  Respiratory: Denies SOB, cough, and wheezing.   Cardiovascular: Denies chest pain and palpitations.  Gastrointestinal: Denies nausea, vomiting, abdominal pain, and diarrhea Musculoskeletal: Denies myalgias, arthralgias, back pain, and gait problem.  Neurological: Denies dizziness, syncope, weakness, lightheadedness, and headaches.  Psychiatric/Behavioral: Denies mood changes, sleep disturbance, and agitation.   Objective:   Physical Exam: Filed Vitals:   12/23/14 0845  BP: 148/90  Pulse: 69  Temp: 97.9 F (36.6 C)  TempSrc: Oral  Height: 6\' 9"  (2.057 m)    Weight: 218 lb 14.4 oz (99.292 kg)  SpO2: 98%    General: AA male, alert, cooperative, NAD. HEENT: PERRL, EOMI. Moist mucus membranes Neck: Full range of motion without pain, supple, no lymphadenopathy or carotid bruits Lungs: Clear to ascultation bilaterally, normal work of respiration, no wheezes, rales, rhonchi Heart: RRR, no murmurs, gallops, or rubs Abdomen: Soft, non-tender, non-distended, BS + Extremities: No cyanosis, clubbing, or edema Neurologic: Alert & oriented x3, cranial nerves II-XII intact, strength grossly intact, sensation intact to light touch   Assessment & Plan:   Please see problem based assessment and plan.

## 2014-12-23 NOTE — Patient Instructions (Signed)
General Instructions:  1. Please schedule a follow up appointment for 6 weeks.   Please take your BP medication before your visit.   2. Please take all medications as previously prescribed with the following changes:  Start taking Lipitor 20 mg at bedtime.   3. If you have worsening of your symptoms or new symptoms arise, please call the clinic ([phone]), or go to the ER immediately if symptoms are severe.  You have done a great job in taking all your medications. Please continue to do this.   Please bring your medicines with you each time you come to clinic.  Medicines may include prescription medications, over-the-counter medications, herbal remedies, eye drops, vitamins, or other pills.   Progress Toward Treatment Goals:  Treatment Goal 12/23/2014  Blood pressure unable to assess  Stop smoking smoking less    Self Care Goals & Plans:  Self Care Goal 12/23/2014  Manage my medications take my medicines as prescribed; bring my medications to every visit; refill my medications on time; follow the sick day instructions if I am sick  Monitor my health keep track of my weight  Eat healthy foods eat more vegetables; eat fruit for snacks and desserts; eat foods that are low in salt; eat smaller portions; drink diet soda or water instead of juice or soda  Be physically active find an activity I enjoy  Stop smoking -  Meeting treatment goals maintain the current self-care plan    No flowsheet data found.   Care Management & Community Referrals:  Referral 12/23/2014  Referrals made for care management support none needed  Referrals made to community resources -

## 2014-12-23 NOTE — Assessment & Plan Note (Signed)
Patient recent lipid panel as follows:  Lipid Panel     Component Value Date/Time   CHOL 177 12/09/2014 0917   TRIG 66 12/09/2014 0917   HDL 81 12/09/2014 0917   CHOLHDL 2.2 12/09/2014 0917   VLDL 13 12/09/2014 0917   LDLCALC 83 12/09/2014 0917  Based on age, race, smoking history, and BP, patient qualifies for moderate intensity statin.  -Start Lipitor 20 mg qhs at this time.  -RTC in 6 weeks.

## 2014-12-23 NOTE — Assessment & Plan Note (Signed)
BP Readings from Last 3 Encounters:  12/23/14 148/90  12/09/14 141/97  01/25/14 147/88    Lab Results  Component Value Date   NA 140 12/09/2014   K 4.2 12/09/2014   CREATININE 0.96 12/09/2014    Assessment: Blood pressure control: mildly elevated Progress toward BP goal:  unable to assess Comments: Patient recently changed to HCTZ 25 mg daily. States he has been taking it daily and says he feels like he is feeling better since he started taking this medicine. Missed his dose this AM.   Plan: Medications:  continue current medications; HCTZ 25 mg daily. Can consider adding ACEI/ARB vs Norvasc at low dose if needed at next clinic visit.  Educational resources provided: brochure, handout Self management tools provided:   Other plans: RTC in 6 weeks.

## 2014-12-24 NOTE — Progress Notes (Signed)
INTERNAL MEDICINE TEACHING ATTENDING ADDENDUM - Obi Scrima, MD: I reviewed and discussed at the time of visit with the resident Dr. Jones, the patient's medical history, physical examination, diagnosis and results of pertinent tests and treatment and I agree with the patient's care as documented.  

## 2015-02-06 ENCOUNTER — Encounter: Payer: Self-pay | Admitting: Internal Medicine

## 2015-02-06 ENCOUNTER — Ambulatory Visit (INDEPENDENT_AMBULATORY_CARE_PROVIDER_SITE_OTHER): Payer: BLUE CROSS/BLUE SHIELD | Admitting: Internal Medicine

## 2015-02-06 VITALS — BP 135/85 | HR 81 | Temp 98.1°F | Ht >= 80 in | Wt 219.5 lb

## 2015-02-06 DIAGNOSIS — F17200 Nicotine dependence, unspecified, uncomplicated: Secondary | ICD-10-CM | POA: Diagnosis not present

## 2015-02-06 DIAGNOSIS — H6121 Impacted cerumen, right ear: Secondary | ICD-10-CM

## 2015-02-06 DIAGNOSIS — Z720 Tobacco use: Secondary | ICD-10-CM

## 2015-02-06 DIAGNOSIS — Z23 Encounter for immunization: Secondary | ICD-10-CM

## 2015-02-06 DIAGNOSIS — E785 Hyperlipidemia, unspecified: Secondary | ICD-10-CM | POA: Diagnosis not present

## 2015-02-06 DIAGNOSIS — I10 Essential (primary) hypertension: Secondary | ICD-10-CM

## 2015-02-06 DIAGNOSIS — Z0000 Encounter for general adult medical examination without abnormal findings: Secondary | ICD-10-CM

## 2015-02-06 MED ORDER — HYDROCHLOROTHIAZIDE 25 MG PO TABS: 25.0000 mg | ORAL_TABLET | Freq: Every day | ORAL | Status: DC

## 2015-02-06 MED ORDER — PRAVASTATIN SODIUM 20 MG PO TABS: 20.0000 mg | ORAL_TABLET | Freq: Every day | ORAL | Status: DC

## 2015-02-06 NOTE — Assessment & Plan Note (Signed)
Lipid Panel     Component Value Date/Time   CHOL 177 12/09/2014 0917   TRIG 66 12/09/2014 0917   HDL 81 12/09/2014 0917   CHOLHDL 2.2 12/09/2014 0917   VLDL 13 12/09/2014 0917   LDLCALC 83 12/09/2014 0917   Reports feeling poorly with mild weakness after taking atorvastatin 20mg , and has discontinued taking it since 4 weeks ago. Patient has dyslipidemia and additional CVD risk factors including smoking, HTN, age so remains appropriate for treatment.  - Discontinue atorvastatin 20mg  - Start Pravastatin

## 2015-02-06 NOTE — Assessment & Plan Note (Signed)
Goals (1 Years of Data) as of 02/06/15          As of Today 12/23/14 12/09/14 01/25/14     Blood Pressure   . Blood Pressure < 140/90  135/85 148/90 141/97 147/88     On HCTZ 25mg  daily since 12/09/14. BP control improved this visit compared to previous on same medication, patient reports taking his medicine in the morning before his visit today compared to taking medicine later in the day at previous visit. No changes indicated at this time, consider adjustments as needed based on routine monitoring.  - Na/K/Cr monitor today, 2 months after HCTZ dose was doubled. - Continue HCTZ 25mg 

## 2015-02-06 NOTE — Progress Notes (Signed)
Subjective:   Patient ID: John Keith male   DOB: 1951/01/23 64 y.o.   MRN: [id number]  HPI: Mr.Daelyn L Demarais is a 64 y.o. man with HTN, GERD, HLD presenting for 6 week follow up for blood pressure control. He is taking medication qAM without difficulty. Needs medication refills. He has not routinely monitored his home blood pressure. He has no episodes of headache, visual changes, or chest pain.  Over the past 6 weeks his new complaints are poor statin tolerance and cerumen impaction of the right ear. Other problems described below in problem based charting.  Past Medical History  Diagnosis Date  . Arthritis   . GERD (gastroesophageal reflux disease)   . Hypertension   . Allergy   . Smoking     smoking since age 53 y.o  . ED (erectile dysfunction)     02/2011   . DDD (degenerative disc disease), thoracolumbar     unknown location   . DDD (degenerative disc disease), thoracolumbar     multilevel  . Hyperlipidemia   . Lactose intolerance   . Diverticulosis     left colon (2008)   . H. pylori infection   . Degenerative joint disease of left shoulder   . Degenerative joint disease of left shoulder     02/2011   . Vitamin D deficiency   . Carpal tunnel syndrome on left    Current Outpatient Prescriptions  Medication Sig Dispense Refill  . aspirin 81 MG tablet Take 1 tablet (81 mg total) by mouth daily. 30 tablet 11  . atorvastatin (LIPITOR) 20 MG tablet Take 1 tablet (20 mg total) by mouth daily. 30 tablet 5  . Calcium Carb-Cholecalciferol 600-800 MG-UNIT TABS Take 600-800 mg by mouth daily. 2 tablets daily    . fluticasone (FLONASE) 50 MCG/ACT nasal spray Place 2 sprays into both nostrils daily. 16 g 2  . hydrochlorothiazide (HYDRODIURIL) 25 MG tablet Take 1 tablet (25 mg total) by mouth daily. 30 tablet 2   No current facility-administered medications for this visit.   Family History  Problem Relation Age of Onset  . Cancer Maternal Uncle     uncle ?maternal or  paternal died colon cancer in his 76s    History   Social History  . Marital Status: Married    Spouse Name: N/A  . Number of Children: N/A  . Years of Education: N/A   Social History Main Topics  . Smoking status: Current Every Day Smoker -- 0.50 packs/day    Start date: 07/06/1971  . Smokeless tobacco: Not on file     Comment: cutting back  . Alcohol Use: Not on file  . Drug Use: Not on file  . Sexual Activity: Not on file   Other Topics Concern  . None   Social History Narrative   Review of Systems: Review of Systems  HENT: Positive for hearing loss. Negative for ear pain.   Eyes: Negative for blurred vision and double vision.  Respiratory: Negative for cough.   Cardiovascular: Negative for chest pain and leg swelling.  Gastrointestinal: Negative for heartburn.  Musculoskeletal: Negative for myalgias.  Skin: Negative for rash.  Neurological: Negative for dizziness, weakness and headaches.    Objective:  Physical Exam: Filed Vitals:   02/06/15 0822  BP: 135/85  Pulse: 81  Temp: 98.1 F (36.7 C)  TempSrc: Oral  Weight: 219 lb 8 oz (99.565 kg)  SpO2: 97%   GENERAL- alert, co-operative, NAD HEENT- R ear cerumen obstructs visualization  of tympanic membrane on initial examination. No significant erythema or swelling of the ear canal visible. Left ear is unobstructed and TM is clear. CARDIAC- RRR, no murmurs, rubs or gallops. RESP- CTAB, no wheezes or crackles. EXTREMITIES- no pedal edema. SKIN- Warm, dry, No rash or lesion. PSYCH- Normal mood and affect, appropriate thought content and speech.  Assessment & Plan:

## 2015-02-06 NOTE — Assessment & Plan Note (Addendum)
Patient age 64 and active smoker, is appropriate for both pneumonia vaccinations. Risks and benefits discussed with patient and he is amenable. Will give 23 valent pneumoccocal vaccination today, plan for PCV-13 after 12 months.

## 2015-02-06 NOTE — Assessment & Plan Note (Addendum)
Complaining of decreased hearing in his right ear over the past few weeks. He has previous required cleaning of his ears for cerumen impaction on 01/22/13. 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 On physical exam cerumen obstructed visualization of the middle ear. No erythema or swelling was visualized, and ear was nontender. Left ear canal was clear of any obstruction.  - Irrigation of right ear canal performed, cerumen mobilized, canal patent on subsequent inspection

## 2015-02-06 NOTE — Assessment & Plan Note (Signed)
Patient briefly counseled on smoking cessation and relation to his hypertension, risk of pneumonia, and risk of CVD. Smoking 1/2 ppd consistently. He remains precontemplative of cessation at this time. Defers additional Transport planner. Continue counseling and offering resources at future visits.

## 2015-02-07 ENCOUNTER — Encounter: Payer: BLUE CROSS/BLUE SHIELD | Admitting: Internal Medicine

## 2015-02-07 LAB — BASIC METABOLIC PANEL
BUN/Creatinine Ratio: 17 (ref 10–22)
BUN: 18 mg/dL (ref 8–27)
CALCIUM: 9.3 mg/dL (ref 8.6–10.2)
CHLORIDE: 98 mmol/L (ref 97–108)
CO2: 25 mmol/L (ref 18–29)
Creatinine, Ser: 1.09 mg/dL (ref 0.76–1.27)
GFR calc non Af Amer: 71 mL/min/{1.73_m2} (ref >59)
GFR, EST AFRICAN AMERICAN: 82 mL/min/{1.73_m2} (ref >59)
GLUCOSE: 93 mg/dL (ref 65–99)
Potassium: 3.7 mmol/L (ref 3.5–5.2)
SODIUM: 141 mmol/L (ref 134–144)

## 2015-02-13 NOTE — Addendum Note (Signed)
Addended by: Fuller Plan on: 02/13/2015 04:21 PM   Modules accepted: Kipp Brood

## 2015-02-17 NOTE — Progress Notes (Signed)
Internal Medicine Clinic Attending  I saw and evaluated the patient.  I personally confirmed the key portions of the history and exam documented by Dr. Rice and I reviewed pertinent patient test results.  The assessment, diagnosis, and plan were formulated together and I agree with the documentation in the resident's note.  

## 2015-02-17 NOTE — Addendum Note (Signed)
Addended by: Erlinda Hong T on: 02/17/2015 08:12 AM   Modules accepted: Level of Service, SmartSet

## 2015-03-06 ENCOUNTER — Telehealth: Payer: Self-pay | Admitting: Internal Medicine

## 2015-03-06 DIAGNOSIS — E785 Hyperlipidemia, unspecified: Secondary | ICD-10-CM

## 2015-03-06 NOTE — Telephone Encounter (Signed)
Call to patient to confirm appointment for 03/07/15 at 9:15 lmtcb ° °

## 2015-03-07 ENCOUNTER — Encounter: Payer: Self-pay | Admitting: Internal Medicine

## 2015-03-07 ENCOUNTER — Ambulatory Visit (INDEPENDENT_AMBULATORY_CARE_PROVIDER_SITE_OTHER): Payer: BLUE CROSS/BLUE SHIELD | Admitting: Internal Medicine

## 2015-03-07 VITALS — BP 140/96 | HR 80 | Temp 98.4°F | Ht >= 80 in | Wt 218.5 lb

## 2015-03-07 DIAGNOSIS — K644 Residual hemorrhoidal skin tags: Secondary | ICD-10-CM | POA: Insufficient documentation

## 2015-03-07 DIAGNOSIS — E785 Hyperlipidemia, unspecified: Secondary | ICD-10-CM | POA: Diagnosis not present

## 2015-03-07 DIAGNOSIS — I10 Essential (primary) hypertension: Secondary | ICD-10-CM

## 2015-03-07 DIAGNOSIS — F17210 Nicotine dependence, cigarettes, uncomplicated: Secondary | ICD-10-CM

## 2015-03-07 DIAGNOSIS — Z720 Tobacco use: Secondary | ICD-10-CM

## 2015-03-07 HISTORY — DX: Residual hemorrhoidal skin tags: K64.4

## 2015-03-07 LAB — CBC WITH DIFFERENTIAL/PLATELET
BASOS ABS: 0 10*3/uL (ref 0.0–0.1)
Basophils Relative: 0 % (ref 0–1)
EOS ABS: 0.1 10*3/uL (ref 0.0–0.7)
EOS PCT: 2 % (ref 0–5)
HCT: 41.3 % (ref 39.0–52.0)
Hemoglobin: 13.3 g/dL (ref 13.0–17.0)
Lymphocytes Relative: 27 % (ref 12–46)
Lymphs Abs: 1.4 10*3/uL (ref 0.7–4.0)
MCH: 31.1 pg (ref 26.0–34.0)
MCHC: 32.2 g/dL (ref 30.0–36.0)
MCV: 96.5 fL (ref 78.0–100.0)
MONO ABS: 0.3 10*3/uL (ref 0.1–1.0)
Monocytes Relative: 7 % (ref 3–12)
Neutro Abs: 3.4 10*3/uL (ref 1.7–7.7)
Neutrophils Relative %: 64 % (ref 43–77)
PLATELETS: 262 10*3/uL (ref 150–400)
RBC: 4.28 MIL/uL (ref 4.22–5.81)
RDW: 13.2 % (ref 11.5–15.5)
WBC: 5.2 10*3/uL (ref 4.0–10.5)

## 2015-03-07 MED ORDER — HYDROCHLOROTHIAZIDE 25 MG PO TABS: 25.0000 mg | ORAL_TABLET | Freq: Every day | ORAL | Status: DC

## 2015-03-07 MED ORDER — ATORVASTATIN CALCIUM 10 MG PO TABS: 10.0000 mg | ORAL_TABLET | Freq: Every day | ORAL | Status: DC

## 2015-03-07 MED ORDER — HYDROCORTISONE 1 % EX CREA: 1.0000 "application " | TOPICAL_CREAM | Freq: Two times a day (BID) | CUTANEOUS | Status: DC

## 2015-03-07 NOTE — Assessment & Plan Note (Signed)
BP Readings from Last 3 Encounters:  03/07/15 140/96  02/06/15 135/85  12/23/14 148/90    Lab Results  Component Value Date   NA 141 02/06/2015   K 3.7 02/06/2015   CREATININE 1.09 02/06/2015    Assessment: Blood pressure control: controlled Progress toward BP goal:  at goal Comments: Stable.  Plan: Medications:  HCTZ 25mg  Educational resources provided: brochure (denied) Self management tools provided:   Other plans: Continue current treatment.

## 2015-03-07 NOTE — Telephone Encounter (Signed)
Collaborated with PCP to switch from pravastatin to atorvastatin. Cost-savings for patient and also increased statin intensity. Notified pharmacy to d/c Rx's on file for atorvastatin.

## 2015-03-07 NOTE — Progress Notes (Signed)
   Subjective:    Patient ID: John Keith, male    DOB: 06-14-1951, 64 y.o.   MRN: [id number]  HPI John Keith is a 64 year old male with hypertension, degenerative disc disease, dyslipidemia, tobacco abuse who presents today for follow-up visit. Please see assessment & plan for documentation of each problem.   Review of Systems  Respiratory: Negative for shortness of breath.   Cardiovascular: Negative for chest pain and leg swelling.  Gastrointestinal: Positive for blood in stool and rectal pain. Negative for nausea, vomiting, abdominal pain and diarrhea.  Genitourinary: Negative for dysuria.  Neurological: Negative for dizziness and light-headedness.       Objective:   Physical Exam Constitutional: Elderly African American male. No distress.  Head: Normocephalic and atraumatic.  Eyes: Conjunctivae are normal. Pupils are 3 mm, direct, consensual, near.  Cardiovascular: Normal rate, regular rhythm and normal heart sounds.  No gallop, friction rub, murmur heard. Pulmonary/Chest: Effort normal. No respiratory distress. No wheezes, rales.  Abdominal: Soft. Bowel sounds are normal. No distension. No tenderness. No masses. Rectal exam notable for no masses. External hemorrhoid just inferior to the anus. Neurological: Alert and oriented to person, place, and time. Coordination normal.  Skin: Warm and dry. Not diaphoretic.  Psychiatric: Affect appropriate.        Assessment & Plan:

## 2015-03-07 NOTE — Assessment & Plan Note (Signed)
  Assessment: Progress toward smoking cessation:  smoking the same amount Barriers to progress toward smoking cessation:  none Comments: Notes he has been smoking since he was a child when his father would send him to the store to light up cigarettes. Interested in stopping and has gone off of them for some time when on vacation which is encouraging for him. Patches made him itch in the past, and he attempted to buy some pills in the past which cost a lot with his insurance.  Plan: Instruction/counseling given:  I counseled patient on the dangers of tobacco use, advised patient to stop smoking, and reviewed strategies to maximize success. Educational resources provided:  [employer]) brochure (denied) Self management tools provided:    Medications to assist with smoking cessation:  None Patient agreed to the following self-care plans for smoking cessation:    Other plans: Follow-up in 1 month

## 2015-03-07 NOTE — Patient Instructions (Addendum)
If your blood work returns abnormal, I will give you a call and be happy to refer you back to the GI doctors. Otherwise you're doing well.  I'm also happy to continue working with you on your smoking, and we can discuss that further at our next appointment if you like.   Please bring your medicines with you each time you come to clinic.  Medicines may include prescription medications, over-the-counter medications, herbal remedies, eye drops, vitamins, or other pills.   Progress Toward Treatment Goals:  Treatment Goal 12/23/2014  Blood pressure unable to assess  Stop smoking smoking less    Self Care Goals & Plans:  Self Care Goal 03/07/2015  Manage my medications take my medicines as prescribed; bring my medications to every visit; refill my medications on time  Monitor my health -  Eat healthy foods drink diet soda or water instead of juice or soda; eat more vegetables; eat foods that are low in salt; eat baked foods instead of fried foods; eat fruit for snacks and desserts  Be physically active -  Stop smoking -  Meeting treatment goals -    No flowsheet data found.   Care Management & Community Referrals:  Referral 12/23/2014  Referrals made for care management support none needed  Referrals made to community resources -

## 2015-03-07 NOTE — Assessment & Plan Note (Signed)
He reports it cost him $32 when he went to go pick up pravastatin last month. I will have Dr. Selena Batten look into why it costs this much given that it's generic and has insurance.

## 2015-03-07 NOTE — Assessment & Plan Note (Addendum)
Overview Two month history of pain after wiping with BM and blood noted on paper towel. Regularly has 1-2 BM/day. Colonoscopy in 2008 notable only for diverticulosis. He denies any new changes to appetite, energy, melena, abdominal pain, constipation, diarrhea, hematuria, dysuria. Does not use NSAIDs or has history of ulcer bleeding.  Assessment Suspect hemorrhoidal bleeding given physical exam findings. Diverticular bleeding another possibility given colonoscopy findings. Risk factors for malignancy include his smoking but no weight loss or changes in functional status to suggest it.  Plan -Check CBC +/- anemia panel if anemic [Hb stable from 2 years] -Prescribe hydrocortisone cream and f/u in 1 month

## 2015-03-11 NOTE — Progress Notes (Signed)
[facility] Medicine Clinic Attending  Case discussed with Dr. Patel,Rushil soon after the resident saw the patient.  We reviewed the resident's history and exam and pertinent patient test results.  I agree with the assessment, diagnosis, and plan of care documented in the resident's note. 

## 2015-04-07 ENCOUNTER — Encounter: Payer: Self-pay | Admitting: Internal Medicine

## 2015-04-07 ENCOUNTER — Ambulatory Visit (INDEPENDENT_AMBULATORY_CARE_PROVIDER_SITE_OTHER): Payer: BLUE CROSS/BLUE SHIELD | Admitting: Internal Medicine

## 2015-04-07 VITALS — BP 113/82 | HR 84 | Temp 98.4°F | Ht >= 80 in | Wt 216.5 lb

## 2015-04-07 DIAGNOSIS — G5602 Carpal tunnel syndrome, left upper limb: Secondary | ICD-10-CM

## 2015-04-07 DIAGNOSIS — K644 Residual hemorrhoidal skin tags: Secondary | ICD-10-CM | POA: Diagnosis not present

## 2015-04-07 NOTE — Progress Notes (Signed)
   Subjective:    Patient ID: John Keith, male    DOB: [birth year], 64 y.o.   MRN: [id number]  HPI  John Keith is a 64 year old male with hypertension, carpal tunnel syndrome, GERD, tobacco abuse who presents today for follow-up visit. Please see assessment & plan for documentation of chronic medical problems.  Review of Systems  Respiratory: Negative for shortness of breath.   Cardiovascular: Negative for chest pain and leg swelling.  Gastrointestinal: Negative for nausea, vomiting, abdominal pain, diarrhea and blood in stool.  Genitourinary: Negative for dysuria.  Neurological: Negative for dizziness.       Objective:   Physical Exam  Constitutional: Middle aged [place] American male. No distress.  Hand: Left thenar eminence with flattening. Phalen's sign with numbness/tingling of 2nd, 3rd, 4th digits of left hand. Tinel's sign unequivocal.         Assessment & Plan:

## 2015-04-07 NOTE — Patient Instructions (Addendum)
General Instructions:   Please bring your medicines with you each time you come to clinic.  Medicines may include prescription medications, over-the-counter medications, herbal remedies, eye drops, vitamins, or other pills.   We will look into where we need to get your nerve conduction study done.   Let's see each other back in 1 month to make sure we are making progress.   Progress Toward Treatment Goals:  Treatment Goal 03/07/2015  Blood pressure at goal  Stop smoking smoking the same amount    Self Care Goals & Plans:  Self Care Goal 04/07/2015  Manage my medications take my medicines as prescribed; bring my medications to every visit; refill my medications on time  Monitor my health -  Eat healthy foods drink diet soda or water instead of juice or soda; eat more vegetables; eat foods that are low in salt; eat baked foods instead of fried foods; eat fruit for snacks and desserts  Be physically active find an activity I enjoy  Stop smoking -  Meeting treatment goals maintain the current self-care plan    No flowsheet data found.   Care Management & Community Referrals:  Referral 03/07/2015  Referrals made for care management support none needed  Referrals made to community resources -

## 2015-04-07 NOTE — Assessment & Plan Note (Signed)
Hydrocortisone cream alleviated his symptoms.

## 2015-04-07 NOTE — Assessment & Plan Note (Signed)
He reports he has not had any follow-up since his last visit with Sports Medicine. He feels his symptoms have worsened and now encompass numbness/tingling of the left thumb in addition to the 2nd, 3rd, 4th fingers. He does not wear his wrist splint throughout the week and was encouraged to do so while we coordinated follow-up with Sports Medicine for the EMG study.

## 2015-04-11 NOTE — Progress Notes (Signed)
[facility] Medicine Clinic Attending  Case discussed with Dr. Patel,Rushil soon after the resident saw the patient.  We reviewed the resident's history and exam and pertinent patient test results.  I agree with the assessment, diagnosis, and plan of care documented in the resident's note. 

## 2015-04-25 ENCOUNTER — Encounter: Payer: Self-pay | Admitting: Family Medicine

## 2015-04-25 ENCOUNTER — Ambulatory Visit (INDEPENDENT_AMBULATORY_CARE_PROVIDER_SITE_OTHER): Payer: BLUE CROSS/BLUE SHIELD | Admitting: Family Medicine

## 2015-04-25 VITALS — BP 146/100 | HR 82 | Ht >= 80 in | Wt 216.0 lb

## 2015-04-25 DIAGNOSIS — G5602 Carpal tunnel syndrome, left upper limb: Secondary | ICD-10-CM | POA: Diagnosis not present

## 2015-04-25 NOTE — Assessment & Plan Note (Signed)
Advised patient to complete EMG/NCS ordered by PCP - Referred to orthopedics for discussion of carpal tunnel release surgery - Advised patient to call it has not been contacted by orthopedics in the next 2 weeks

## 2015-04-25 NOTE — Progress Notes (Signed)
Patient ID: John Keith, male   DOB: 31-May-1951, 64 y.o.   MRN: [id number] [facility] Attending Note: I have seen and examined this patient. I have discussed this patient with the resident and reviewed the assessment and plan as documented above. I agree with the resident's findings and plan. I also told him if he had not heard from [place] regarding the results within a few days after completing the test to call us. We gave him an appointment card today with his appointment so there should be no further questions about when his appointment is. I asked him if he wanted it corticosteroid shot today and he did not. He has not been wearing his night brace and I recommended he do that. If nerve conduction study/EMG are consistent with carpal tunnel then I think he needs to be evaluated for carpal tunnel release and he's in agreement with that.

## 2015-04-25 NOTE — Patient Instructions (Signed)
Your left wrist pain is due to carpal tunnel syndrome.  We will refer you to orthopedic surgery to have this corrected.  In the meantime, continue to wear your wrist splints every night to prevent pain.   Call sports medicine clinic if you have not heard from the orthopedists in 1-2 weeks  Call the clinic after your surgery for follow-up as needed  Carpal Tunnel Syndrome Carpal tunnel syndrome is a condition that causes pain in your hand and arm. The carpal tunnel is a narrow area that is on the palm side of your wrist. Repeated wrist motion or certain diseases may cause swelling in the tunnel. This swelling can pinch the main nerve in the wrist (median nerve).  HOME CARE If You Have a Splint:  Wear it as told by your doctor. Remove it only as told by your doctor.  Loosen the splint if your fingers:  Become numb and tingle.  Turn blue and cold.  Keep the splint clean and dry. General Instructions  Take over-the-counter and prescription medicines only as told by your doctor.  Rest your wrist from any activity that may be causing your pain. If needed, talk to your employer about changes that can be made in your work, such as getting a wrist pad to use while typing.  If directed, apply ice to the painful area:  Put ice in a plastic bag.  Place a towel between your skin and the bag.  Leave the ice on for 20 minutes, 2-3 times per day.  Keep all follow-up visits as told by your doctor. This is important.  Do any exercises as told by your doctor, physical therapist, or occupational therapist. GET HELP IF:  You have new symptoms.  Medicine does not help your pain.  Your symptoms get worse.   This information is not intended to replace advice given to you by your health care provider. Make sure you discuss any questions you have with your health care provider.   Document Released: 06/10/2011 Document Revised: 03/12/2015 Document Reviewed: 11/06/2014 Elsevier Interactive  Patient Education [employer]

## 2015-04-25 NOTE — Progress Notes (Signed)
  John Keith - 64 y.o. male MRN [id number]  Date of birth: [birth year]    SUBJECTIVE:     Returns today at request of PCP for continued left wrist pain.  He was previously seen and diagnosed with carpal tunnel and EMG/NCS was ordered; however, these were never completed and he was unable to be contacted by phone.  He reports rarely using nocturnal wrist splints.  Pain is left wrist and numbness have progressed and he is willing to consider surgery at this point.   ROS:     Denies fever, chills  PERTINENT  PMH / PSH FH / / SH:  Past Medical, Surgical, Social, and Family History Reviewed & Updated in the EMR.  Pertinent findings include:  - Notable for hypertension, tobacco abuse  OBJECTIVE: BP 146/100 mmHg  Pulse 82  Ht 6\' 9"  (2.057 m)  Wt 216 lb (97.977 kg)  BMI 23.16 kg/m2  Physical Exam:  Vital signs are reviewed. Left Wrist: Thenar atrophy; decrease sensation over first, second and third digits.  Left wrist pain and numbness reproducible with compression of median nerve.  Strength 5/5 in grip strength and thumb opposition  Left Wrist  [place] - Flattening of left medial nerve  ASSESSMENT & PLAN:  Carpal tunnel syndrome of left wrist Advised patient to complete EMG/NCS ordered by PCP - Referred to orthopedics for discussion of carpal tunnel release surgery - Advised patient to call it has not been contacted by orthopedics in the next 2 weeks

## 2015-06-03 ENCOUNTER — Encounter: Payer: Self-pay | Admitting: Neurology

## 2015-06-10 ENCOUNTER — Ambulatory Visit (INDEPENDENT_AMBULATORY_CARE_PROVIDER_SITE_OTHER): Payer: BLUE CROSS/BLUE SHIELD | Admitting: Internal Medicine

## 2015-06-10 ENCOUNTER — Encounter: Payer: Self-pay | Admitting: Internal Medicine

## 2015-06-10 VITALS — BP 142/87 | HR 91 | Temp 98.4°F | Ht >= 80 in | Wt 222.1 lb

## 2015-06-10 DIAGNOSIS — I10 Essential (primary) hypertension: Secondary | ICD-10-CM | POA: Diagnosis not present

## 2015-06-10 DIAGNOSIS — G4700 Insomnia, unspecified: Secondary | ICD-10-CM

## 2015-06-10 MED ORDER — MELATONIN 10 MG PO TABS: 10.0000 mg | ORAL_TABLET | Freq: Every evening | ORAL | Status: DC | PRN

## 2015-06-10 NOTE — Assessment & Plan Note (Signed)
Overview Blood pressure today 142/87. He reports adherence HCTZ 25 mg daily and has no complaints.  Assessment Essential hypertension, well controlled  Plan Continue HCTZ 25 mg daily

## 2015-06-10 NOTE — Assessment & Plan Note (Signed)
Overview For the past 2 years, he reports difficulty sleeping. 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 Upon returning home, he usually has a meal before going to sleep. He reports drinking coffee roughly 10:45 PM daily. He also smokes 1 cigarette on the way to work and on the way back from work. Though he is able to fall asleep fairly easily, usually wakes up around 4 AM and finds it difficult to go back to sleep. He denies napping during the day and occasionally watches TV prior to going to sleep. He has not tried any medications for sleep though reports Benadryl does not really offer him sedative effect. Though he snores, he denies any episodes of apnea. He also denies any new stressors in his life.  Assessment Sleep maintenance insomnia secondary to occupation  Plan -Counseled the patient on sleep hygiene: Limiting caffeine intake prior to bed, limiting TV -Try melatonin 9 mg to be taken 2 hours prior to bedtime.

## 2015-06-10 NOTE — Patient Instructions (Addendum)
General Instructions:   Please bring your medicines with you each time you come to clinic.  Medicines may include prescription medications, over-the-counter medications, herbal remedies, eye drops, vitamins, or other pills.     #1 For your insomnia, take melatonin 10mg  about 2-3 hours before you go to sleep.   Please come back in 1 month to see how this medicine is working for you.    Progress Toward Treatment Goals:  Treatment Goal 03/07/2015  Blood pressure at goal  Stop smoking smoking the same amount    Self Care Goals & Plans:  Self Care Goal 06/10/2015  Manage my medications take my medicines as prescribed; bring my medications to every visit; refill my medications on time; follow the sick day instructions if I am sick  Monitor my health keep track of my weight  Eat healthy foods drink diet soda or water instead of juice or soda; eat more vegetables; eat foods that are low in salt; eat baked foods instead of fried foods; eat fruit for snacks and desserts  Be physically active find an activity I enjoy  Stop smoking -  Meeting treatment goals -    No flowsheet data found.   Care Management & Community Referrals:  Referral 03/07/2015  Referrals made for care management support none needed  Referrals made to community resources -

## 2015-06-10 NOTE — Progress Notes (Signed)
Internal Medicine Clinic Attending  Case discussed with Dr. Patel,Rushil at the time of the visit.  We reviewed the resident's history and exam and pertinent patient test results.  I agree with the assessment, diagnosis, and plan of care documented in the resident's note.  

## 2015-06-10 NOTE — Addendum Note (Signed)
Addended by: Erlinda Hong T on: 06/10/2015 02:03 PM   Modules accepted: Level of Service

## 2015-06-10 NOTE — Progress Notes (Addendum)
   Subjective:    Patient ID: John Keith, male    DOB: 09/16/1950, 64 y.o.   MRN: [id number]  HPI Mr. Ferdinand is a 64 year old male with hypertension, external hemorrhoids, seasonal allergies, GERD, diverticulosis, ongoing tobacco use, thoracolumbar degenerative disc disease who presents today for insomnia. Please see assessment & plan for status of chronic medical problems.  Of note, he is scheduled for his nerve conduction study in 2 days to assess for carpal tunnel syndrome in his left hand.  Review of Systems  Constitutional: Positive for fatigue.  Respiratory: Negative for cough, choking and shortness of breath.   Neurological: Negative for syncope.  Psychiatric/Behavioral: Positive for sleep disturbance.       Objective:   Physical Exam  Constitutional: He is oriented to person, place, and time. He appears well-developed and well-nourished. No distress.  HENT:  Head: Normocephalic and atraumatic.  Mouth/Throat: Oropharynx is clear and moist. No oropharyngeal exudate.  Eyes: Conjunctivae and EOM are normal.  Neck: Normal range of motion. Neck supple.  Cardiovascular: Normal rate and regular rhythm.  Exam reveals no gallop and no friction rub.   No murmur heard. Pulmonary/Chest: Effort normal and breath sounds normal. No respiratory distress. He has no wheezes. He has no rales.  Neurological: He is alert and oriented to person, place, and time. No cranial nerve deficit.  Skin: Skin is warm and dry. He is not diaphoretic.  Psychiatric: He has a normal mood and affect. His behavior is normal.          Assessment & Plan:

## 2015-06-11 ENCOUNTER — Other Ambulatory Visit: Payer: Self-pay | Admitting: Internal Medicine

## 2015-06-11 NOTE — Telephone Encounter (Signed)
Sent electronically but pharm denies rec'ing, given verb via ph

## 2015-06-11 NOTE — Telephone Encounter (Signed)
Pt requesting sleep med to be filled.

## 2015-06-12 ENCOUNTER — Encounter (INDEPENDENT_AMBULATORY_CARE_PROVIDER_SITE_OTHER): Payer: Self-pay

## 2015-06-12 ENCOUNTER — Ambulatory Visit (INDEPENDENT_AMBULATORY_CARE_PROVIDER_SITE_OTHER): Payer: Self-pay | Admitting: Neurology

## 2015-06-12 ENCOUNTER — Ambulatory Visit (INDEPENDENT_AMBULATORY_CARE_PROVIDER_SITE_OTHER): Payer: BLUE CROSS/BLUE SHIELD | Admitting: Neurology

## 2015-06-12 DIAGNOSIS — G5603 Carpal tunnel syndrome, bilateral upper limbs: Secondary | ICD-10-CM

## 2015-06-12 DIAGNOSIS — G5602 Carpal tunnel syndrome, left upper limb: Secondary | ICD-10-CM

## 2015-06-12 DIAGNOSIS — G5601 Carpal tunnel syndrome, right upper limb: Secondary | ICD-10-CM

## 2015-06-12 DIAGNOSIS — Z0289 Encounter for other administrative examinations: Secondary | ICD-10-CM

## 2015-06-12 NOTE — Progress Notes (Signed)
Electrodiagnostic study today confirmed bilateral carpal tunnel syndromes, left-sided severe, mainly demyelinating in nature, right side is moderate. He would benefit wrist splint, if he failed conservative treatment, surgical decompression would be an option.

## 2015-06-12 NOTE — Procedures (Signed)
   NCS (NERVE CONDUCTION STUDY) WITH EMG (ELECTROMYOGRAPHY) REPORT   STUDY DATE: December 8th 2016 PATIENT NAME: John Keith DOB: 1951/06/29 MRN: [id number]    TECHNOLOGIST: Gearldine Shown ELECTROMYOGRAPHER: Levert Feinstein M.D.  CLINICAL INFORMATION:  64 years old male, presented with bilateral hands paresthesia since 2014, progressively worse, left worse than right. On examination: He has mild to moderate left abductor pollicis brevis and left opponens weakness, decreased light touch at the first left fourth finger pads, Tinel signs were present at bilateral wrists.  FINDINGS: NERVE CONDUCTION STUDY: Bilateral ulnar sensory and motor responses were normal. Left median sensory response was absent.   Left median motor response showed severely decreased C map amplitude, severely prolonged distal latency and F-wave latency, with normal conduction velocity. Right median sensory response showed mild to moderately prolonged peak latency with well preserved snap amplitude.  Right median motor response showed mildly prolonged distal latency, was normal C map amplitude, and conduction velocity.   NEEDLE ELECTROMYOGRAPHY:  Selected needle examinations were performed at left upper extremity muscles   Left abductor pollicis brevis: Increased insertional activity, no spontaneous activity, enlarged complex motor unit potential with decreased recruitment patterns.  Needle examination of left extensor digitorum communis, left pronator teres, biceps, deltoid and triceps was normal  IMPRESSION:   This was an abnormal study. There was electrodiagnostic evidence of bilateral median neuropathy across the wrist, consistent with bilateral carpal tunnel syndromes, left-sided severe, right side is moderate.    INTERPRETING PHYSICIAN:   Levert Feinstein M.D. Ph.D. [facility] [address] [place], [place] [address] [phone]

## 2015-06-23 ENCOUNTER — Encounter: Payer: Self-pay | Admitting: Neurology

## 2015-07-08 ENCOUNTER — Encounter: Payer: Self-pay | Admitting: Internal Medicine

## 2015-07-08 ENCOUNTER — Ambulatory Visit (INDEPENDENT_AMBULATORY_CARE_PROVIDER_SITE_OTHER): Payer: BLUE CROSS/BLUE SHIELD | Admitting: Internal Medicine

## 2015-07-08 VITALS — BP 142/92 | HR 80 | Temp 98.1°F | Wt 227.5 lb

## 2015-07-08 DIAGNOSIS — G5603 Carpal tunnel syndrome, bilateral upper limbs: Secondary | ICD-10-CM

## 2015-07-08 DIAGNOSIS — G4700 Insomnia, unspecified: Secondary | ICD-10-CM | POA: Diagnosis not present

## 2015-07-08 DIAGNOSIS — G5602 Carpal tunnel syndrome, left upper limb: Secondary | ICD-10-CM

## 2015-07-08 DIAGNOSIS — I10 Essential (primary) hypertension: Secondary | ICD-10-CM

## 2015-07-08 DIAGNOSIS — F17210 Nicotine dependence, cigarettes, uncomplicated: Secondary | ICD-10-CM

## 2015-07-08 MED ORDER — ZOLPIDEM TARTRATE 5 MG PO TABS: 5.0000 mg | ORAL_TABLET | Freq: Every evening | ORAL | Status: DC | PRN

## 2015-07-08 NOTE — Assessment & Plan Note (Signed)
BP Readings from Last 3 Encounters:  07/08/15 142/92  06/10/15 142/87  04/25/15 146/100    Lab Results  Component Value Date   NA 141 02/06/2015   K 3.7 02/06/2015   CREATININE 1.09 02/06/2015    Assessment: Blood pressure control:   Controlled Progress toward BP goal:   At goal Comments: Compliant with HCTZ 25 mg daily.   Plan: Medications:  continue current medications Other plans: follow up in 3 months

## 2015-07-08 NOTE — Progress Notes (Signed)
Subjective:    Patient ID: John Keith, male    DOB: [birth year], 65 y.o.   MRN: [id number]  HPI John Keith is a 65 y.o. male with PMHx of HTN, Insomnia, HLD, GERD who presents to the clinic for follow up for insomnia and carpal tunnel syndrome. Please see A&P for the status of the patient's chronic medical problems.   Past Medical History  Diagnosis Date  . Arthritis   . GERD (gastroesophageal reflux disease)   . Hypertension   . Allergy   . Smoking     smoking since age 6 y.o  . ED (erectile dysfunction)     02/2011   . DDD (degenerative disc disease), thoracolumbar     unknown location   . DDD (degenerative disc disease), thoracolumbar     multilevel  . Hyperlipidemia   . Lactose intolerance   . Diverticulosis     left colon (2008)   . H. pylori infection   . Degenerative joint disease of left shoulder   . Degenerative joint disease of left shoulder     02/2011   . Vitamin D deficiency   . Carpal tunnel syndrome on left     Outpatient Encounter Prescriptions as of 07/08/2015  Medication Sig Note  . aspirin 81 MG tablet Take 1 tablet (81 mg total) by mouth daily.   Marland Kitchen atorvastatin (LIPITOR) 10 MG tablet Take 1 tablet (10 mg total) by mouth daily.   . Calcium Carb-Cholecalciferol 600-800 MG-UNIT TABS Take 600-800 mg by mouth daily. 2 tablets daily   . fluticasone (FLONASE) 50 MCG/ACT nasal spray Place 2 sprays into both nostrils daily.   . hydrochlorothiazide (HYDRODIURIL) 25 MG tablet Take 1 tablet (25 mg total) by mouth daily.   . hydrocortisone cream 1 % Apply 1 application topically 2 (two) times daily.   . Melatonin 10 MG TABS Take 10 mg by mouth at bedtime as needed (2 hours prior to sleep.).   Marland Kitchen pravastatin (PRAVACHOL) 20 MG tablet TK 1 T PO QD 04/25/2015: Received from: External Pharmacy   No facility-administered encounter medications on file as of 07/08/2015.    Family History  Problem Relation Age of Onset  . Cancer Maternal Uncle     uncle ?maternal  or paternal died colon cancer in his 72s   . Throat cancer Father 52    Social History   Social History  . Marital Status: Married    Spouse Name: N/A  . Number of Children: N/A  . Years of Education: N/A   Occupational History  . Not on file.   Social History Main Topics  . Smoking status: Current Every Day Smoker -- 0.50 packs/day    Start date: 07/06/1971  . Smokeless tobacco: Not on file     Comment: cutting back  . Alcohol Use: Not on file  . Drug Use: Not on file  . Sexual Activity: Not on file   Other Topics Concern  . Not on file   Social History Narrative    Review of Systems General: Admits to fatigue. Denies change in appetite.  Respiratory: Denies cough, post-nasal drip. Cardiovascular: Denies chest pain and palpitations.  Genitourinary: Denies nocturia. Musculoskeletal: Admits to left wrist and hand pain with decreased sensation. Denies myalgias, back pain.  Skin: Denies rash and wounds.  Neurological: Admits to numbness and tingling of left hand. Denies dizziness, headaches, weakness, lightheadedness. Psychiatric/Behavioral: Admits to insomnia. Denies mood changes.     Objective:   Physical Exam Filed Vitals:  07/08/15 0831  BP: 156/89  Pulse: 98  Temp: 98.1 F (36.7 C)  TempSrc: Oral  Weight: 227 lb 8 oz (103.193 kg)  SpO2: 98%   General: Vital signs reviewed.  Patient is well-developed and well-nourished, in no acute distress and cooperative with exam.  Cardiovascular: RRR, S1 normal, S2 normal. Pulmonary/Chest: Clear to auscultation bilaterally, no wheezes, rales, or rhonchi. Extremities: No lower extremity edema bilaterally Neurological: + Tinel's sign on left wrist, decreased sensation left hand.   Skin: Warm, dry and intact.  Psychiatric: Normal mood. Flat affect.     Assessment & Plan:   Please see problem based assessment and plan.

## 2015-07-08 NOTE — Patient Instructions (Signed)
We have placed a referral to Hand Surgery for your carpal tunnel. Please continue wearing your wrist splint.  For your insomnia, STOP taking melatonin. We will start a trial of Ambien for your insomnia. Take one pill before bedtime as needed. Remember not to drive or operate heavy machinery within 8 hours of taking the medication.   Zolpidem tablets What is this medicine? ZOLPIDEM (zole PI dem) is used to treat insomnia. This medicine helps you to fall asleep and sleep through the night. This medicine may be used for other purposes; ask your health care provider or pharmacist if you have questions. What should I tell my health care provider before I take this medicine? They need to know if you have any of these conditions: -depression -history of drug abuse or addiction -if you often drink alcohol -liver disease -lung or breathing disease -myasthenia gravis -sleep apnea -suicidal thoughts, plans, or attempt; a previous suicide attempt by you or a family member -an unusual or allergic reaction to zolpidem, other medicines, foods, dyes, or preservatives -pregnant or trying to get pregnant -breast-feeding How should I use this medicine? Take this medicine by mouth with a glass of water. Follow the directions on the prescription label. It is better to take this medicine on an empty stomach and only when you are ready for bed. Do not take your medicine more often than directed. If you have been taking this medicine for several weeks and suddenly stop taking it, you may get unpleasant withdrawal symptoms. Your doctor or health care professional may want to gradually reduce the dose. Do not stop taking this medicine on your own. Always follow your doctor or health care professional's advice. A special MedGuide will be given to you by the pharmacist with each prescription and refill. Be sure to read this information carefully each time. Talk to your pediatrician regarding the use of this medicine in  children. Special care may be needed. Overdosage: If you think you have taken too much of this medicine contact a poison control center or emergency room at once. NOTE: This medicine is only for you. Do not share this medicine with others. What if I miss a dose? This does not apply. This medicine should only be taken immediately before going to sleep. Do not take double or extra doses. What may interact with this medicine? -alcohol -antihistamines for allergy, cough and cold -certain medicines for anxiety or sleep -certain medicines for depression, like amitriptyline, fluoxetine, sertraline -certain medicines for fungal infections like ketoconazole and itraconazole -certain medicines for seizures like phenobarbital, primidone -ciprofloxacin -dietary supplements for sleep, like valerian or kava kava -general anesthetics like halothane, isoflurane, methoxyflurane, propofol -local anesthetics like lidocaine, pramoxine, tetracaine -medicines that relax muscles for surgery -narcotic medicines for pain -phenothiazines like chlorpromazine, mesoridazine, prochlorperazine, thioridazine -rifampin This list may not describe all possible interactions. Give your health care provider a list of all the medicines, herbs, non-prescription drugs, or dietary supplements you use. Also tell them if you smoke, drink alcohol, or use illegal drugs. Some items may interact with your medicine. What should I watch for while using this medicine? Visit your doctor or health care professional for regular checks on your progress. Keep a regular sleep schedule by going to bed at about the same time each night. Avoid caffeine-containing drinks in the evening hours. When sleep medicines are used every night for more than a few weeks, they may stop working. Talk to your doctor if you still have trouble sleeping. After taking this  medicine for sleep, you may get up out of bed while not being fully awake and do an activity that  you do not know you are doing. The next morning, you may have no memory of the event. Activities such as driving a car ("sleep-driving"), making and eating food, talking on the phone, sexual activity, and sleep-walking have been reported. Call your doctor right away if you find out you have done any of these activities. Do not take this medicine if you have used alcohol that evening or before bed or taken another medicine for sleep since your risk of doing these sleep-related activities will be increased. Wait for at least 8 hours after you take a dose before driving or doing other activities that require full mental alertness. Do not take this medicine unless you are able to stay in bed for a full night (7 to 8 hours) before you must be active again. You may have a decrease in mental alertness the day after use, even if you feel that you are fully awake. Tell your doctor if you will need to perform activities requiring full alertness, such as driving, the next day. Do not stand or sit up quickly after taking this medicine, especially if you are an older patient. This reduces the risk of dizzy or fainting spells. If you or your family notice any changes in your behavior, such as new or worsening depression, thoughts of harming yourself, anxiety, other unusual or disturbing thoughts, or memory loss, call your doctor right away. After you stop taking this medicine, you may have trouble falling asleep. This is called rebound insomnia. This problem usually goes away on its own after 1 or 2 nights. What side effects may I notice from receiving this medicine? Side effects that you should report to your doctor or health care professional as soon as possible: -allergic reactions like skin rash, itching or hives, swelling of the face, lips, or tongue -breathing problems -changes in vision -confusion -depressed mood or other changes in moods or emotions -feeling faint or lightheaded, falls -hallucinations -loss  of balance or coordination -loss of memory -restlessness, excitability, or feelings of anxiety or agitation -suicidal thoughts -unusual activities while asleep like driving, eating, making phone calls, or sexual activity Side effects that usually do not require medical attention (report to your doctor or health care professional if they continue or are bothersome): -dizziness -drowsiness the day after you take this medicine -headache This list may not describe all possible side effects. Call your doctor for medical advice about side effects. You may report side effects to FDA at 1-800-FDA-1088. Where should I keep my medicine? Keep out of the reach of children. This medicine can be abused. Keep your medicine in a safe place to protect it from theft. Do not share this medicine with anyone. Selling or giving away this medicine is dangerous and against the law. This medicine may cause accidental overdose and death if taken by other adults, children, or pets. Mix any unused medicine with a substance like cat litter or coffee grounds. Then throw the medicine away in a sealed container like a sealed bag or a coffee can with a lid. Do not use the medicine after the expiration date. Store at room temperature between 20 and 25 degrees C (68 and 77 degrees F). NOTE: This sheet is a summary. It may not cover all possible information. If you have questions about this medicine, talk to your doctor, pharmacist, or health care provider.    2016,  Elsevier/Gold Standard. (2015-02-24 17:53:29)

## 2015-07-08 NOTE — Assessment & Plan Note (Signed)
Patient admits to decreased sensation of left hand with occasional sharp, shooting pain from left wrist into his hand. Patient was seen by Neurology who conducted EMG studies which revealed severe carpal tunnel of left wrist and moderate carpal tunnel of right wrist. Recommendations were for patient to wear wrist splint and if he failed conservative measures, to be referred to hand surgery. Patient states he has been wearing his wrist splints at night for one month without improvement. He would like to discuss surgical options. Physical exam is consistent with carpal tunnel syndrome of left wrist with positive tinel's sign.  Assessment: Carpal Tunnel Syndrome, Bilateral  Plan: -Continue wrist splints -Referral to hand surgery to discuss surgical options

## 2015-07-08 NOTE — Assessment & Plan Note (Addendum)
Patient was seen one month ago with complaint of insomnia. At that time, patient stated he did not have trouble falling asleep, but staying asleep. It was recommended that patient try conservative measures, sleep hygiene, and melatonin 10 mg. Patient states he no longer watches tv before bed, naps less, and no longer drinks coffee late at night. He has been trying the melatonin, but states it doesn't work for him. He continues to fall asleep at 3 am, wake up at 6 am until 8 am, sleep from 8 am until 10 am. He feels he is only getting 4-5 hours per night. This is very bothersome to him. Ideally, he would like to sleep from 3 am until 10-11 am without waking up.   Assessment: Insomnia which has failed melatonin and sleep hygiene  Plan: -Stop melatonin -Trial of one month of Ambien 5 mg QHS #30, no refills -Patient was warned on the side effects of Ambien and that he should not drive within 8 hours of taking the medication -If patient states John Keith is not working, please inquire if patient has been taking the medication on an empty stomach

## 2015-07-08 NOTE — Progress Notes (Signed)
Internal Medicine Clinic Attending  Case discussed with Dr. Richardson at the time of the visit.  We reviewed the resident's history and exam and pertinent patient test results.  I agree with the assessment, diagnosis, and plan of care documented in the resident's note. 

## 2015-08-08 ENCOUNTER — Encounter: Payer: Self-pay | Admitting: Internal Medicine

## 2015-08-08 ENCOUNTER — Ambulatory Visit (INDEPENDENT_AMBULATORY_CARE_PROVIDER_SITE_OTHER): Payer: BLUE CROSS/BLUE SHIELD | Admitting: Internal Medicine

## 2015-08-08 VITALS — BP 135/82 | HR 72 | Temp 98.3°F | Ht >= 80 in | Wt 219.3 lb

## 2015-08-08 DIAGNOSIS — R1013 Epigastric pain: Secondary | ICD-10-CM | POA: Diagnosis not present

## 2015-08-08 DIAGNOSIS — R195 Other fecal abnormalities: Secondary | ICD-10-CM | POA: Diagnosis not present

## 2015-08-08 DIAGNOSIS — R1033 Periumbilical pain: Secondary | ICD-10-CM

## 2015-08-08 DIAGNOSIS — R197 Diarrhea, unspecified: Secondary | ICD-10-CM

## 2015-08-08 DIAGNOSIS — R1084 Generalized abdominal pain: Secondary | ICD-10-CM

## 2015-08-08 DIAGNOSIS — K921 Melena: Secondary | ICD-10-CM

## 2015-08-08 LAB — CBC WITH DIFFERENTIAL/PLATELET
BASOS PCT: 0 %
Basophils Absolute: 0 10*3/uL (ref 0.0–0.1)
EOS ABS: 0.1 10*3/uL (ref 0.0–0.7)
Eosinophils Relative: 2 %
HEMATOCRIT: 40.4 % (ref 39.0–52.0)
Hemoglobin: 13.3 g/dL (ref 13.0–17.0)
Lymphocytes Relative: 17 %
Lymphs Abs: 0.9 10*3/uL (ref 0.7–4.0)
MCH: 31.4 pg (ref 26.0–34.0)
MCHC: 32.9 g/dL (ref 30.0–36.0)
MCV: 95.3 fL (ref 78.0–100.0)
MONO ABS: 0.4 10*3/uL (ref 0.1–1.0)
MONOS PCT: 8 %
Neutro Abs: 3.9 10*3/uL (ref 1.7–7.7)
Neutrophils Relative %: 73 %
Platelets: 254 10*3/uL (ref 150–400)
RBC: 4.24 MIL/uL (ref 4.22–5.81)
RDW: 13.3 % (ref 11.5–15.5)
WBC: 5.3 10*3/uL (ref 4.0–10.5)

## 2015-08-08 LAB — COMPREHENSIVE METABOLIC PANEL
ALBUMIN: 3.6 g/dL (ref 3.5–5.0)
ALK PHOS: 82 U/L (ref 38–126)
ALT: 28 U/L (ref 17–63)
AST: 43 U/L — AB (ref 15–41)
Anion gap: 10 (ref 5–15)
BILIRUBIN TOTAL: 0.5 mg/dL (ref 0.3–1.2)
BUN: 16 mg/dL (ref 6–20)
CALCIUM: 9 mg/dL (ref 8.9–10.3)
CO2: 29 mmol/L (ref 22–32)
CREATININE: 1.1 mg/dL (ref 0.61–1.24)
Chloride: 101 mmol/L (ref 101–111)
GFR calc Af Amer: >60 mL/min (ref >60)
GFR calc non Af Amer: >60 mL/min (ref >60)
Glucose, Bld: 96 mg/dL (ref 65–99)
POTASSIUM: 3.3 mmol/L — AB (ref 3.5–5.1)
Sodium: 140 mmol/L (ref 135–145)
TOTAL PROTEIN: 6.8 g/dL (ref 6.5–8.1)

## 2015-08-08 LAB — LIPASE, BLOOD: LIPASE: 35 U/L (ref 11–51)

## 2015-08-08 LAB — POC HEMOCCULT BLD/STL (OFFICE/1-CARD/DIAGNOSTIC): Fecal Occult Blood, POC: NEGATIVE

## 2015-08-08 MED ORDER — POTASSIUM CHLORIDE ER 20 MEQ PO TBCR: 40.0000 meq | EXTENDED_RELEASE_TABLET | Freq: Two times a day (BID) | ORAL | Status: DC

## 2015-08-08 NOTE — Patient Instructions (Signed)
Your blood work is okay today. But we will still need to check your stools fopr blood hence we are giving you three samples. We will also like to check your stool for  A bacteria. We will like to see you in 1-2 weeks if you are still having diarrhea.   You are most likely having diarrhea from a virus and that usually clears on its own.   YOur potassium is low, we will call in a prescription of Potassium tablets.

## 2015-08-08 NOTE — Progress Notes (Signed)
Medicine attending: Medical history, presenting problems, physical findings, and medications, reviewed with resident physician Dr Ejiro Emokpae on the day of the patient visit and I concur with her evaluation and management plan. 

## 2015-08-08 NOTE — Assessment & Plan Note (Signed)
Diarrhea for 4 days With abdominal pain and black stools. No dizziness, normal vitals, and does not appear dehydrated on exam. Most likely due to virus. No recent antibiotics.  Plan- CMET stat- Check Cr and electrolytes - CBC stat- Hgb Stable at 13.3, no leukocytosis - Stat Lipase for abdominal pain- negative at 34. - Low K- 3.3,  replete with Kdur BID X3 doses,  - FOBT here in clinic- negative - FOBT stool cards X3 - Stool for C. diff - See in 1-2 weeks if persistent diarrhea

## 2015-08-08 NOTE — Progress Notes (Signed)
Patient ID: John Keith, male   DOB: Nov 17, 1950, 65 y.o.   MRN: [id number]   Subjective:   Patient ID: John Keith male   DOB: [birth year] 65 y.o.   MRN: [id number]  HPI: [name] is a 65 y.o. presented today with c/o diarrhea that started on Monday, 4 days ago, watery, 4-5 times a day, everyday. Yesterday he saw blood in his stool, spots of blood, he says his stools have been black, this started on tuesday, three days ago. Has had black stools in the past - was diagnosed with ulcers years ago- cannot remember if he had an EGD. No vomiting. Denies NSAID use. Also pt has been having abd pain intermmitently for the past 4 days, all over his abdomen, no aggrav factors, poor appetite. Tylenol has not helped. Took it 1-2 ce a day. No vomiting. Pt endorses Fever- intermittent, with chills, did not check temperature.  Recent travel- No, sick contacts- no, antibiotics- No. Pt has been taking his HCTZ throughout this time. No dizziness.  Past Medical History  Diagnosis Date  . Arthritis   . GERD (gastroesophageal reflux disease)   . Hypertension   . Allergy   . Smoking     smoking since age 20 y.o  . ED (erectile dysfunction)     02/2011   . DDD (degenerative disc disease), thoracolumbar     unknown location   . DDD (degenerative disc disease), thoracolumbar     multilevel  . Hyperlipidemia   . Lactose intolerance   . Diverticulosis     left colon (2008)   . H. pylori infection   . Degenerative joint disease of left shoulder   . Degenerative joint disease of left shoulder     02/2011   . Vitamin D deficiency   . Carpal tunnel syndrome on left    Current Outpatient Prescriptions  Medication Sig Dispense Refill  . aspirin 81 MG tablet Take 1 tablet (81 mg total) by mouth daily. 30 tablet 11  . atorvastatin (LIPITOR) 10 MG tablet Take 1 tablet (10 mg total) by mouth daily. 30 tablet 3  . Calcium Carb-Cholecalciferol 600-800 MG-UNIT TABS Take 600-800 mg by mouth daily. 2  tablets daily    . fluticasone (FLONASE) 50 MCG/ACT nasal spray Place 2 sprays into both nostrils daily. 16 g 2  . hydrochlorothiazide (HYDRODIURIL) 25 MG tablet Take 1 tablet (25 mg total) by mouth daily. 30 tablet 6  . hydrocortisone cream 1 % Apply 1 application topically 2 (two) times daily. 30 g 0  . pravastatin (PRAVACHOL) 20 MG tablet TK 1 T PO QD  2  . zolpidem (AMBIEN) 5 MG tablet Take 1 tablet (5 mg total) by mouth at bedtime as needed for sleep. 30 tablet 0   No current facility-administered medications for this visit.   Family History  Problem Relation Age of Onset  . Cancer Maternal Uncle     uncle ?maternal or paternal died colon cancer in his 43s   . Throat cancer Father 64   Social History   Social History  . Marital Status: Married    Spouse Name: N/A  . Number of Children: N/A  . Years of Education: N/A   Social History Main Topics  . Smoking status: Current Every Day Smoker -- 0.50 packs/day    Start date: 07/06/1971  . Smokeless tobacco: Not on file     Comment: cutting back  . Alcohol Use: Not on file  . Drug Use: Not on  file  . Sexual Activity: Not on file   Other Topics Concern  . Not on file   Social History Narrative   Review of Systems: SKIN- No Rash, colour changes or itching. HEAD- Mild Headache , no dizziness. EYES- No Vision loss, pain, redness, double or blurred vision. RESPIRATORY- No Cough or SOB. CARDIAC- No Palpitations,  or chest pain. URINARY- No Frequency, or dysuria. NEUROLOGIC- No Numbness, syncope, seizures or burning. [facility]- Denies depression or anxiety.  Objective:  Physical Exam: Filed Vitals:   08/08/15 0935  BP: 135/82  Pulse: 72  Temp: 98.3 F (36.8 C)  TempSrc: Oral  Height: 6\' 9"  (2.057 m)  Weight: 219 lb 4.8 oz (99.474 kg)  SpO2: 100%   GENERAL- alert, co-operative, appears as stated age, not in any distress. HEENT- Atraumatic, normocephalic, PERRL, oral mucosa appears moist,  CARDIAC- RRR, no murmurs,  rubs or gallops. RESP- Moving equal volumes of air, and clear to auscultation bilaterally, no wheezes or crackles. ABDOMEN- Soft, tender epigastric and umbilical region, no guarding or rebound, no palpable masses or organomegaly, bowel sounds present. Rectal Exam- no external hemorrhoids seen, gloved finger stained with dark stool.  BACK- Normal curvature of the spine NEURO- No obvious Cr N abnormality, strenght upper and lower extremities-intact, Gait- Normal. EXTREMITIES- pulse 2+, symmetric, no pedal edema. SKIN- Warm, dry, No rash or lesion. PSYCH- Normal mood and affect, appropriate thought content and speech.  Assessment & Plan:  The patient's case and plan of care was discussed with attending physician, Dr. Cyndie Chime  Please see problem based charting for assessment and plan.

## 2015-08-14 ENCOUNTER — Other Ambulatory Visit (INDEPENDENT_AMBULATORY_CARE_PROVIDER_SITE_OTHER): Payer: BLUE CROSS/BLUE SHIELD

## 2015-08-14 DIAGNOSIS — K921 Melena: Secondary | ICD-10-CM

## 2015-08-14 LAB — POC HEMOCCULT BLD/STL (HOME/3-CARD/SCREEN)
Card #2 Fecal Occult Blod, POC: NEGATIVE
Card #3 Fecal Occult Blood, POC: NEGATIVE
FECAL OCCULT BLD: NEGATIVE

## 2015-10-16 ENCOUNTER — Other Ambulatory Visit: Payer: Self-pay | Admitting: Internal Medicine

## 2015-10-20 NOTE — Telephone Encounter (Signed)
Last OV 2/3 No upcoming appointments

## 2015-10-20 NOTE — Telephone Encounter (Signed)
Can we call to see if his diarrhea has resolved? I do not feel comfortable refilling this medication if he is at risk for dehydration.

## 2015-10-21 NOTE — Telephone Encounter (Signed)
Tried to call patient no answer. 

## 2015-10-24 ENCOUNTER — Other Ambulatory Visit: Payer: Self-pay

## 2015-10-24 DIAGNOSIS — I10 Essential (primary) hypertension: Secondary | ICD-10-CM

## 2015-10-24 MED ORDER — HYDROCHLOROTHIAZIDE 25 MG PO TABS: 25.0000 mg | ORAL_TABLET | Freq: Every day | ORAL | Status: DC

## 2015-10-24 NOTE — Telephone Encounter (Signed)
Spoke with pt- he forgot to call me back earlier in the week.  He is no longer having diarrhea, states it resolved shortly after his OV in Feb.  He is not completely out of HCTZ but would like to get filled soon.

## 2015-10-24 NOTE — Telephone Encounter (Signed)
That sounds reasonable. Thank you for following up.

## 2015-10-24 NOTE — Telephone Encounter (Signed)
Pt requesting hydrochlorothiazide to be filled @ [facility].

## 2015-11-26 ENCOUNTER — Ambulatory Visit (INDEPENDENT_AMBULATORY_CARE_PROVIDER_SITE_OTHER): Payer: BLUE CROSS/BLUE SHIELD | Admitting: Internal Medicine

## 2015-11-26 ENCOUNTER — Encounter: Payer: Self-pay | Admitting: Internal Medicine

## 2015-11-26 VITALS — BP 123/83 | HR 74 | Temp 98.3°F | Ht >= 80 in | Wt 225.6 lb

## 2015-11-26 DIAGNOSIS — E785 Hyperlipidemia, unspecified: Secondary | ICD-10-CM | POA: Diagnosis not present

## 2015-11-26 DIAGNOSIS — Z8639 Personal history of other endocrine, nutritional and metabolic disease: Secondary | ICD-10-CM

## 2015-11-26 DIAGNOSIS — J302 Other seasonal allergic rhinitis: Secondary | ICD-10-CM

## 2015-11-26 DIAGNOSIS — E559 Vitamin D deficiency, unspecified: Secondary | ICD-10-CM | POA: Diagnosis not present

## 2015-11-26 DIAGNOSIS — Z23 Encounter for immunization: Secondary | ICD-10-CM | POA: Insufficient documentation

## 2015-11-26 MED ORDER — ATORVASTATIN CALCIUM 10 MG PO TABS: 10.0000 mg | ORAL_TABLET | Freq: Every day | ORAL | Status: DC

## 2015-11-26 MED ORDER — ZOSTER VACCINE LIVE 19400 UNT/0.65ML ~~LOC~~ SUSR: 0.6500 mL | Freq: Once | SUBCUTANEOUS | Status: DC

## 2015-11-26 MED ORDER — FLUTICASONE PROPIONATE 50 MCG/ACT NA SUSP: 1.0000 | Freq: Every day | NASAL | Status: DC

## 2015-11-26 NOTE — Assessment & Plan Note (Addendum)
Overview In reconciling his medications today, it appears he is no longer taking atorvastatin 10 mg daily. He is agreeable to resuming this medication. Per 2013 AHA/ACC ASCVD risk calculator, 10-year risk is 23% and 7.5% with optimal risk factors using values from most recent lipid profile and blood pressure today. Moderate to high intensity statin therapy is thus indicated.  Assessment History of dyslipidemia in the setting of tobacco abuse. He denies any prior history of cardiac disease or family history of cardiac disease.  Plan -Refilled atorvastatin 10 mg today and encouraged him to take this medication

## 2015-11-26 NOTE — Assessment & Plan Note (Addendum)
Overview In reconciling his medications today, he was unsure if he needed to be taking vitamin supplementation. In fact, he says he has been taking vitamin E supplementation which is in disagreement with what is documented here. Per review of the chart, vitamin D was within normal limits when last checked in December 2015.  Assessment Questionable history of vitamin D deficiency  Plan -Recheck vitamin D today and recommend supplementation accordingly  ADDENDUM 03/05/2016  10:10 AM:  I commented on the result shortly after I checked it but forgot to include it here until today.  Notes Recorded by Beather Arbour, MD on 11/27/2015 at 8:39 AM Vitamin D mildly low at 24. A daily multivitamin with vitamin D 1000 units should be sufficient.

## 2015-11-26 NOTE — Patient Instructions (Addendum)
For your cholesterol, TAKE Lipitor 10mg .  We will check your Vitamin D and recommend supplementation from there.  For the shingles vaccine, ask how much it costs before they give it to you.  Please see me back in 3-6 months for flu and pneumonia shot.

## 2015-11-26 NOTE — Assessment & Plan Note (Signed)
Overview With the change in season, he does report symptoms of congestion. He would like for me to refill fluticasone nasal spray today.  Assessment Seasonal allergic rhinitis  Plan -Refilled fluticasone nasal spray 1-2 sprays per nostril daily. Reviewed proper nasal spray technique with him as well.

## 2015-11-26 NOTE — Assessment & Plan Note (Signed)
Overview Given his age, he is eligible for shingles immunization which he is agreeable to getting today.  Assessment Eligible for shingles vaccine  Plan -Prescribe shingles vaccine and instructed him to ask about the cost prior to administration in the event that he could not afford it

## 2015-11-26 NOTE — Progress Notes (Signed)
   Subjective:    Patient ID: John Keith, male    DOB: [birth year], 65 y.o.   MRN: [id number]  HPI John Keith is a 65 year old male who presents today for follow-up for hyperlipidemia, vitamin D deficiency. Please see assessment & plan for status of chronic medical problems.    Review of Systems  Constitutional: Positive for fatigue.  Respiratory: Negative for shortness of breath.   Cardiovascular: Negative for chest pain.  Allergic/Immunologic: Positive for environmental allergies.  Psychiatric/Behavioral: Positive for sleep disturbance.       Objective:   Physical Exam  Constitutional: He is oriented to person, place, and time. He appears well-developed and well-nourished. No distress.  HENT:  Head: Normocephalic and atraumatic.  Mouth/Throat: Oropharynx is clear and moist. No oropharyngeal exudate.  Eyes: Conjunctivae and EOM are normal.  Neck: Normal range of motion. Neck supple.  Cardiovascular: Normal rate and regular rhythm.  Exam reveals no gallop and no friction rub.   No murmur heard. Pulmonary/Chest: Effort normal and breath sounds normal. No respiratory distress. He has no wheezes. He has no rales.  Neurological: He is alert and oriented to person, place, and time. No cranial nerve deficit.  Skin: Skin is warm and dry. He is not diaphoretic.          Assessment & Plan:

## 2015-11-26 NOTE — Progress Notes (Signed)
Case discussed with Dr. Patel at the time of the visit.  We reviewed the resident's history and exam and pertinent patient test results.  I agree with the assessment, diagnosis, and plan of care documented in the resident's note. 

## 2015-11-27 LAB — VITAMIN D 25 HYDROXY (VIT D DEFICIENCY, FRACTURES): Vit D, 25-Hydroxy: 24.2 ng/mL — ABNORMAL LOW (ref 30.0–100.0)

## 2016-02-20 ENCOUNTER — Ambulatory Visit (INDEPENDENT_AMBULATORY_CARE_PROVIDER_SITE_OTHER): Payer: BLUE CROSS/BLUE SHIELD | Admitting: Internal Medicine

## 2016-02-20 ENCOUNTER — Encounter: Payer: Self-pay | Admitting: Internal Medicine

## 2016-02-20 DIAGNOSIS — M5416 Radiculopathy, lumbar region: Secondary | ICD-10-CM | POA: Insufficient documentation

## 2016-02-20 DIAGNOSIS — M545 Low back pain, unspecified: Secondary | ICD-10-CM

## 2016-02-20 NOTE — Patient Instructions (Signed)
Thank you for your visit today  Please use ice and/or heating pads to relieve the pain.  You do not have a kidney infection. Your pain is mostlikely from the back muscle which may be sore.  Please use tylenol and/or ibuprofen. Follow the directions on the bottle- do not take more than 3000 mg of tylenol per day and more than 6 pills of ibuprofen per day  Please return if your pain does not improve in 3-4 weeks  Please continue to exercise   Back Pain, Adult Back pain is very common in adults.The cause of back pain is rarely dangerous and the pain often gets better over time.The cause of your back pain may not be known. Some common causes of back pain include:  Strain of the muscles or ligaments supporting the spine.  Wear and tear (degeneration) of the spinal disks.  Arthritis.  Direct injury to the back. For many people, back pain may return. Since back pain is rarely dangerous, most people can learn to manage this condition on their own. HOME CARE INSTRUCTIONS Watch your back pain for any changes. The following actions may help to lessen any discomfort you are feeling:  Remain active. It is stressful on your back to sit or stand in one place for long periods of time. Do not sit, drive, or stand in one place for more than 30 minutes at a time. Take short walks on even surfaces as soon as you are able.Try to increase the length of time you walk each day.  Exercise regularly as directed by your health care provider. Exercise helps your back heal faster. It also helps avoid future injury by keeping your muscles strong and flexible.  Do not stay in bed.Resting more than 1-2 days can delay your recovery.  Pay attention to your body when you bend and lift. The most comfortable positions are those that put less stress on your recovering back. Always use proper lifting techniques, including:  Bending your knees.  Keeping the load close to your body.  Avoiding twisting.  Find a  comfortable position to sleep. Use a firm mattress and lie on your side with your knees slightly bent. If you lie on your back, put a pillow under your knees.  Avoid feeling anxious or stressed.Stress increases muscle tension and can worsen back pain.It is important to recognize when you are anxious or stressed and learn ways to manage it, such as with exercise.  Take medicines only as directed by your health care provider. Over-the-counter medicines to reduce pain and inflammation are often the most helpful.Your health care provider may prescribe muscle relaxant drugs.These medicines help dull your pain so you can more quickly return to your normal activities and healthy exercise.  Apply ice to the injured area:  Put ice in a plastic bag.  Place a towel between your skin and the bag.  Leave the ice on for 20 minutes, 2-3 times a day for the first 2-3 days. After that, ice and heat may be alternated to reduce pain and spasms.  Maintain a healthy weight. Excess weight puts extra stress on your back and makes it difficult to maintain good posture. SEEK MEDICAL CARE IF:  You have pain that is not relieved with rest or medicine.  You have increasing pain going down into the legs or buttocks.  You have pain that does not improve in one week.  You have night pain.  You lose weight.  You have a fever or chills. SEEK IMMEDIATE  MEDICAL CARE IF:   You develop new bowel or bladder control problems.  You have unusual weakness or numbness in your arms or legs.  You develop nausea or vomiting.  You develop abdominal pain.  You feel faint.   This information is not intended to replace advice given to you by your health care provider. Make sure you discuss any questions you have with your health care provider.   Document Released: 06/21/2005 Document Revised: 07/12/2014 Document Reviewed: 10/23/2013 Elsevier Interactive Patient Education [employer]

## 2016-02-20 NOTE — Progress Notes (Signed)
   CC: low back pain, concern for UTI HPI: [name] is a 65 y.o. man with PMH noted below here for low back pain since Monday and concern for UTI  Please see Problem List/A&P for the status of the patient's chronic medical problems   Past Medical History:  Diagnosis Date  . Allergy   . Arthritis   . Carpal tunnel syndrome on left   . DDD (degenerative disc disease), thoracolumbar    unknown location   . DDD (degenerative disc disease), thoracolumbar    multilevel  . Degenerative joint disease of left shoulder   . Degenerative joint disease of left shoulder    02/2011   . Diverticulosis    left colon (2008)   . ED (erectile dysfunction)    02/2011   . GERD (gastroesophageal reflux disease)   . H. pylori infection   . Hyperlipidemia   . Hypertension   . Lactose intolerance   . Smoking    smoking since age 50 y.o  . Vitamin D deficiency     Review of Systems: Denies fevers, fatigue, back pain prior to Monday Denies SOB Denies n/v/abd pain, diarrhea. Has constipation.  Has some burning on urination. Denies urinary frequency.  Has back pain. Denies tingling or numbness./ Denies incontinence  Physical Exam: Vitals:   02/20/16 0924  BP: 139/87  Pulse: 72  Temp: 98.1 F (36.7 C)  TempSrc: Oral  SpO2: 100%  Weight: 223 lb 1.6 oz (101.2 kg)  Height: 6\' 9"  (2.057 m)    General: A&O, in NAD CV: RRR, normal s1, s2, no m/r/g, Resp: equal and symmetric breath sounds, no wheezing or crackles  Abdomen: soft, nontender, nondistended, +BS Extremities:no edema Back: right lower paraspinal muscle tender to palpation. No spinal tenderness. No CVA tenderness. Str leg test negative bilaterally- Pain present only int he hip and no radicular symptoms   Assessment & Plan:   See encounters tab for problem based medical decision making. Patient discussed with Dr. Rogelia Boga

## 2016-02-20 NOTE — Assessment & Plan Note (Addendum)
Patient says that his back pain started on Monday when he was going from church to [facility] and he drank a cup of tea, then mountain dew. He is worried that he has a UTI as '[employer] is not clean'. He describes the pain as dull mainly on the lower right side. He has only tried one aspirin. Nothing seems to make the pain better. Standing from sitting position makes it worse. He thinks he may have some dysuria. Pain is about a "7" and it has stayed about the same  On exam, str leg test neg bilaterally.. Lower right paraspinal muscle tender to palpation. Dipstick UA neg for leuks or nitrites. No CVA tenderness  A: musculoskeletal low back pain  -advised heating pads and ice -tylenol and ibuprofen as needed for few days -exercise -RTC in 4 weeks if does not improve.

## 2016-02-23 NOTE — Progress Notes (Signed)
Internal Medicine Clinic Attending  Case discussed with Dr. Saraiya soon after the resident saw the patient.  We reviewed the resident's history and exam and pertinent patient test results.  I agree with the assessment, diagnosis, and plan of care documented in the resident's note.  

## 2016-03-01 ENCOUNTER — Ambulatory Visit (INDEPENDENT_AMBULATORY_CARE_PROVIDER_SITE_OTHER): Payer: BLUE CROSS/BLUE SHIELD | Admitting: Internal Medicine

## 2016-03-01 VITALS — BP 153/95 | HR 65 | Temp 98.0°F | Wt 221.3 lb

## 2016-03-01 DIAGNOSIS — M5416 Radiculopathy, lumbar region: Secondary | ICD-10-CM | POA: Diagnosis not present

## 2016-03-01 MED ORDER — NAPROXEN 500 MG PO TABS: 500.0000 mg | ORAL_TABLET | Freq: Two times a day (BID) | ORAL | 0 refills | Status: DC

## 2016-03-01 MED ORDER — CYCLOBENZAPRINE HCL 10 MG PO TABS: 10.0000 mg | ORAL_TABLET | Freq: Three times a day (TID) | ORAL | 0 refills | Status: DC | PRN

## 2016-03-01 NOTE — Progress Notes (Signed)
    CC: back pain  HPI: [name] is a 65 y.o. male with PMHx of HTN , GERD, DDD who presents to the clinic for follow up for back pain.   Patient presents with complaint of persistent back pain. He has a history of low back pain s/p surgery in 1995. He has done well since that surgery. On 02/20/16 patient developed acute onset of right low back pain with radiation of pain down his right leg. He denied any preceding injury, new activity or work outs. He works as a Location manager at work. Pain is constant, dull, achey. It is worse with standing, walking and laying on his right side. Pain is improved with heat and also improved after taking his wife's oxycodone. However, the pain comes back. At it's worst, it is a 10/10. At it's best, it is a 6/10. He denies numbness, weakness, loss of bladder or bowel continence. He tried ibuprofen 400 mg TID without relief. He denies dysuria, urinary frequency or unexpected weight loss.    Past Medical History:  Diagnosis Date  . Allergy   . Arthritis   . Carpal tunnel syndrome on left   . DDD (degenerative disc disease), thoracolumbar    unknown location   . DDD (degenerative disc disease), thoracolumbar    multilevel  . Degenerative joint disease of left shoulder   . Degenerative joint disease of left shoulder    02/2011   . Diverticulosis    left colon (2008)   . ED (erectile dysfunction)    02/2011   . GERD (gastroesophageal reflux disease)   . H. pylori infection   . Hyperlipidemia   . Hypertension   . Lactose intolerance   . Smoking    smoking since age 37 y.o  . Vitamin D deficiency     Review of Systems: Please see pertinent ROS reviewed in HPI and problem based charting.   Physical Exam: Vitals:   03/01/16 0819  BP: (!) 153/95  Pulse: 65  Temp: 98 F (36.7 C)  TempSrc: Oral  SpO2: 98%  Weight: 221 lb 4.8 oz (100.4 kg)   General: Vital signs reviewed.  Patient is well-developed and well-nourished, in no acute distress  and cooperative with exam.  Cardiovascular: RRR, S1 normal, S2 normal, no murmurs, gallops, or rubs. Pulmonary/Chest: Clear to auscultation bilaterally, no wheezes, rales, or rhonchi. Musculoskeletal: No pain on palpation of lumbosacral spine. Tense musculature in right lower back. Tender over right SI joint.  Extremities: No lower extremity edema bilaterally, pulses symmetric and intact bilaterally.  Neurological: Strength is normal and symmetric bilaterally in lower extremities, sensory intact to light touch bilaterally but decreased in right lower extremity.  Skin: Warm, dry and intact. No rashes or erythema. Psychiatric: Normal mood and affect. speech and behavior is normal. Cognition and memory are normal.   Assessment & Plan:  See encounters tab for problem based medical decision making. Patient discussed with Dr. Rogelia Boga

## 2016-03-01 NOTE — Patient Instructions (Signed)
For your back pain: 1. Take Naproxen 500 mg twice a day with food 2. Take cyclobenzaprine 10 mg up to three times a day for muscle spasm. Try the first dose prior to bedtime. If it is too strong for you, you can cut the pill in half and take 5 mg. You can take this up to three times a day. Caution as this can make you tired. 3. Continue to apply heat to the are 4. Use light stretches and exercises as below   Sciatica Sciatica is pain, weakness, numbness, or tingling along your sciatic nerve. The nerve starts in the lower back and runs down the back of each leg. Nerve damage or certain conditions pinch or put pressure on the sciatic nerve. This causes the pain, weakness, and other discomforts of sciatica. HOME CARE   Only take medicine as told by your doctor.  Apply ice to the affected area for 20 minutes. Do this 3-4 times a day for the first 48-72 hours. Then try heat in the same way.  Exercise, stretch, or do your usual activities if these do not make your pain worse.  Go to physical therapy as told by your doctor.  Keep all doctor visits as told.  Do not wear high heels or shoes that are not supportive.  Get a firm mattress if your mattress is too soft to lessen pain and discomfort. GET HELP RIGHT AWAY IF:   You cannot control when you poop (bowel movement) or pee (urinate).  You have more weakness in your lower back, lower belly (pelvis), butt (buttocks), or legs.  You have redness or puffiness (swelling) of your back.  You have a burning feeling when you pee.  You have pain that gets worse when you lie down.  You have pain that wakes you from your sleep.  Your pain is worse than past pain.  Your pain lasts longer than 4 weeks.  You are suddenly losing weight without reason. MAKE SURE YOU:   Understand these instructions.  Will watch this condition.  Will get help right away if you are not doing well or get worse.    Back Exercises If you have pain in your  back, do these exercises 2-3 times each day or as told by your doctor. When the pain goes away, do the exercises once each day, but repeat the steps more times for each exercise (do more repetitions). If you do not have pain in your back, do these exercises once each day or as told by your doctor. EXERCISES Single Knee to Chest Do these steps 3-5 times in a row for each leg: 1. Lie on your back on a firm bed or the floor with your legs stretched out. 2. Bring one knee to your chest. 3. Hold your knee to your chest by grabbing your knee or thigh. 4. Pull on your knee until you feel a gentle stretch in your lower back. 5. Keep doing the stretch for 10-30 seconds. 6. Slowly let go of your leg and straighten it. Pelvic Tilt Do these steps 5-10 times in a row: 1. Lie on your back on a firm bed or the floor with your legs stretched out. 2. Bend your knees so they point up to the ceiling. Your feet should be flat on the floor. 3. Tighten your lower belly (abdomen) muscles to press your lower back against the floor. This will make your tailbone point up to the ceiling instead of pointing down to your feet or  the floor. 4. Stay in this position for 5-10 seconds while you gently tighten your muscles and breathe evenly. Cat-Cow Do these steps until your lower back bends more easily: 1. Get on your hands and knees on a firm surface. Keep your hands under your shoulders, and keep your knees under your hips. You may put padding under your knees. 2. Let your head hang down, and make your tailbone point down to the floor so your lower back is round like the back of a cat. 3. Stay in this position for 5 seconds. 4. Slowly lift your head and make your tailbone point up to the ceiling so your back hangs low (sags) like the back of a cow. 5. Stay in this position for 5 seconds. Press-Ups Do these steps 5-10 times in a row: 1. Lie on your belly (face-down) on the floor. 2. Place your hands near your head, about  shoulder-width apart. 3. While you keep your back relaxed and keep your hips on the floor, slowly straighten your arms to raise the top half of your body and lift your shoulders. Do not use your back muscles. To make yourself more comfortable, you may change where you place your hands. 4. Stay in this position for 5 seconds. 5. Slowly return to lying flat on the floor. Bridges Do these steps 10 times in a row: 1. Lie on your back on a firm surface. 2. Bend your knees so they point up to the ceiling. Your feet should be flat on the floor. 3. Tighten your butt muscles and lift your butt off of the floor until your waist is almost as high as your knees. If you do not feel the muscles working in your butt and the back of your thighs, slide your feet 1-2 inches farther away from your butt. 4. Stay in this position for 3-5 seconds. 5. Slowly lower your butt to the floor, and let your butt muscles relax. If this exercise is too easy, try doing it with your arms crossed over your chest. Belly Crunches Do these steps 5-10 times in a row: 1. Lie on your back on a firm bed or the floor with your legs stretched out. 2. Bend your knees so they point up to the ceiling. Your feet should be flat on the floor. 3. Cross your arms over your chest. 4. Tip your chin a little bit toward your chest but do not bend your neck. 5. Tighten your belly muscles and slowly raise your chest just enough to lift your shoulder blades a tiny bit off of the floor. 6. Slowly lower your chest and your head to the floor. Back Lifts Do these steps 5-10 times in a row: 1. Lie on your belly (face-down) with your arms at your sides, and rest your forehead on the floor. 2. Tighten the muscles in your legs and your butt. 3. Slowly lift your chest off of the floor while you keep your hips on the floor. Keep the back of your head in line with the curve in your back. Look at the floor while you do this. 4. Stay in this position for 3-5  seconds. 5. Slowly lower your chest and your face to the floor. GET HELP IF:  Your back pain gets a lot worse when you do an exercise.  Your back pain does not lessen 2 hours after you exercise. If you have any of these problems, stop doing the exercises. Do not do them again unless your doctor says  it is okay. GET HELP RIGHT AWAY IF:  You have sudden, very bad back pain. If this happens, stop doing the exercises. Do not do them again unless your doctor says it is okay.   This information is not intended to replace advice given to you by your health care provider. Make sure you discuss any questions you have with your health care provider.   Document Released: 07/24/2010 Document Revised: 03/12/2015 Document Reviewed: 08/15/2014 Elsevier Interactive Patient Education [employer]

## 2016-03-01 NOTE — Assessment & Plan Note (Addendum)
Patient presents with complaint of persistent back pain. He has a history of low back pain s/p surgery in 1995. He has done well since that surgery. On 02/20/16 patient developed acute onset of right low back pain with radiation of pain down his right leg. He denied any preceding injury, new activity or work outs. He works as a Location manager at work. Pain is constant, dull, achey. It is worse with standing, walking and laying on his right side. Pain is improved with heat and also improved after taking his wife's oxycodone. However, the pain comes back. At it's worst, it is a 10/10. At it's best, it is a 6/10. He denies numbness, weakness, loss of bladder or bowel continence. He tried ibuprofen 400 mg TID without relief. He denies dysuria, urinary frequency or unexpected weight loss.  Physical exam shows no pain on palpation of lumbosacral spine, tense musculature in right lower back, tender over right SI joint. Strength is normal and symmetric bilaterally in lower extremities, sensory intact to light touch bilaterally but decreased in right lower extremity.   Assessment: Acute Lumbar Radiculopathy with Muscle Spasm.   Plan: -Naproxen 500 mg BID WC -Flexeril 10 mg TID prn  -Heat -Stretches and Exercises- information provided -Work note given -Return if worsening or no improvement.  -Low suspicion for a more serious problem such as lytic bone lesions or metastasis, but is pain is persistent in 2 weeks (present for one month) would proceed with lumbar spine xrays versus MRI

## 2016-03-01 NOTE — Progress Notes (Signed)
Internal Medicine Clinic Attending  Case discussed with Dr. Burns at the time of the visit.  We reviewed the resident's history and exam and pertinent patient test results.  I agree with the assessment, diagnosis, and plan of care documented in the resident's note.  

## 2016-03-05 ENCOUNTER — Encounter: Payer: BLUE CROSS/BLUE SHIELD | Admitting: Internal Medicine

## 2016-03-09 ENCOUNTER — Telehealth: Payer: Self-pay | Admitting: Internal Medicine

## 2016-03-09 NOTE — Telephone Encounter (Signed)
APT. REMINDER CALL, LMTCB °

## 2016-03-10 ENCOUNTER — Ambulatory Visit (INDEPENDENT_AMBULATORY_CARE_PROVIDER_SITE_OTHER): Payer: BLUE CROSS/BLUE SHIELD | Admitting: Internal Medicine

## 2016-03-10 ENCOUNTER — Encounter: Payer: Self-pay | Admitting: Internal Medicine

## 2016-03-10 ENCOUNTER — Ambulatory Visit (HOSPITAL_COMMUNITY)
Admission: RE | Admit: 2016-03-10 | Discharge: 2016-03-10 | Disposition: A | Discharge status: Home / Self Care | Payer: BLUE CROSS/BLUE SHIELD | Source: Ambulatory Visit | Attending: Internal Medicine | Admitting: Internal Medicine

## 2016-03-10 DIAGNOSIS — M47896 Other spondylosis, lumbar region: Secondary | ICD-10-CM | POA: Diagnosis not present

## 2016-03-10 DIAGNOSIS — F17200 Nicotine dependence, unspecified, uncomplicated: Secondary | ICD-10-CM

## 2016-03-10 DIAGNOSIS — K5900 Constipation, unspecified: Secondary | ICD-10-CM

## 2016-03-10 DIAGNOSIS — M5416 Radiculopathy, lumbar region: Secondary | ICD-10-CM | POA: Insufficient documentation

## 2016-03-10 DIAGNOSIS — M160 Bilateral primary osteoarthritis of hip: Secondary | ICD-10-CM | POA: Diagnosis not present

## 2016-03-10 HISTORY — DX: Constipation, unspecified: K59.00

## 2016-03-10 MED ORDER — DICLOFENAC SODIUM 1 % TD GEL: 4.0000 g | Freq: Four times a day (QID) | TRANSDERMAL | 3 refills | Status: DC

## 2016-03-10 MED ORDER — SENNOSIDES-DOCUSATE SODIUM 8.6-50 MG PO TABS: 2.0000 | ORAL_TABLET | Freq: Every day | ORAL | 1 refills | Status: AC | PRN

## 2016-03-10 NOTE — Assessment & Plan Note (Signed)
Back pain continues, no improvement since previous visit. The pain tracks from right lumbar spine to right buttock into right groin and down right thigh. Sharp and constant, worsened by walking, standing, ambulation, sitting up straight. Now conincides with right groin/inguinal crease numbness, denies any incontinence of bladder or bowel but endorses mild constipation. Has emporary relief with heat pads but none from Tylenol, Advil, Naproxen, Flexeril, stretching/exercise. Pain similar to that preceding a disc-repair surgery that he had in 1995. Pain significantly impacting daily life - per patient - average 9/10 per week, interferes with enjoyment 9/10, interferes with general activity 7/10. He has missed work often because he cannot stand for more than two hours before the burning pain becomes unbearable. Exam largely unchanged from previous.   Assessment: lumbar radiculopathy with muscle spasm +/- right hip osteoarthritis  Plan:  - XR lumbar spine and right hip today - Voltaren gel 4g, QID PRN - Ambulatory referral to PT  - Continue Flexeril 10 mg TID prn - Heat pads PRN - Work not provided - Follow up in 1.5weeks

## 2016-03-10 NOTE — Assessment & Plan Note (Signed)
New since 03/01/16, having hard BMs every other day. Tried Miralax 3-4x with no effect. Provided Rx for senna-docusate 2 tablets daily PRN.

## 2016-03-10 NOTE — Progress Notes (Signed)
   CC: Lower back and right hip pain  HPI:  John Keith is a 65 y.o. male with PMHx detailed below presenting with ongoing lower back pain, unchanged from previous visit and now refractory to Naproxen/Flexeril/home exercises and stretches.  See problem based assessment and plan below for additional details.  Past Medical History:  Diagnosis Date  . Allergy   . Arthritis   . Carpal tunnel syndrome on left   . DDD (degenerative disc disease), thoracolumbar    unknown location   . DDD (degenerative disc disease), thoracolumbar    multilevel  . Degenerative joint disease of left shoulder   . Degenerative joint disease of left shoulder    02/2011   . Diverticulosis    left colon (2008)   . ED (erectile dysfunction)    02/2011   . GERD (gastroesophageal reflux disease)   . H. pylori infection   . Hyperlipidemia   . Hypertension   . Lactose intolerance   . Smoking    smoking since age 65 y.o  . Vitamin D deficiency     Review of Systems: Review of Systems  Constitutional: Negative for chills and fever.  Respiratory: Negative for cough.   Cardiovascular: Negative for chest pain.  Gastrointestinal: Positive for constipation.  Musculoskeletal: Positive for back pain, joint pain and myalgias.  Neurological: Positive for tingling and sensory change.     Physical Exam: Vitals:   03/10/16 0820  BP: (!) 134/97  Pulse: 98  Temp: 98 F (36.7 C)  TempSrc: Oral  SpO2: 98%  Weight: 221 lb 14.4 oz (100.7 kg)  Height: 6\' 9"  (2.057 m)   GENERAL- alert, co-operative, NAD HEENT- Atraumatic, PERRL, EOMI, oral mucosa appears moist CARDIAC- RRR, no murmurs, rubs or gallops. RESP- CTAB, no wheezes or crackles. ABDOMEN- Soft, nontender, no guarding or rebound BACK- Normal curvature, no spinal tenderness, low right paraspinal tenderness with tense musculature, tenderness continues into right buttock and right inguinal crease NEURO- Alert and oriented, cranial nerves grossly  intact, strength upper and lower extremities- 5/5, decreased sensation in right inguinal crease, otherwise sensation intact globally EXTREMITIES- pulse 2+, symmetric, no pedal edema. SKIN- Warm, dry, no rash or lesion. PSYCH- Normal mood and affect, appropriate thought content and speech.  Assessment & Plan:   See encounters tab for problem based medical decision making.   Patient seen with Dr. Heide Spark

## 2016-03-10 NOTE — Patient Instructions (Addendum)
We have provided you with a new prescription today - please apply Voltaren gel up to 4 times daily to the location of your pain. Also, please take the Senna-Docusate daily for constipation relief.  Please call us or visit the ER if your back pain acutely worsens or you develop bowel or bladder incontinence.

## 2016-03-11 NOTE — Progress Notes (Signed)
Internal Medicine Clinic Attending  I saw and evaluated the patient.  I personally confirmed the key portions of the history and exam documented by Dr. Johnson and I reviewed pertinent patient test results.  The assessment, diagnosis, and plan were formulated together and I agree with the documentation in the resident's note.  

## 2016-03-16 ENCOUNTER — Ambulatory Visit: Payer: BLUE CROSS/BLUE SHIELD | Attending: Pediatrics | Admitting: Physical Therapy

## 2016-03-16 DIAGNOSIS — M6281 Muscle weakness (generalized): Secondary | ICD-10-CM | POA: Diagnosis present

## 2016-03-16 DIAGNOSIS — M545 Low back pain, unspecified: Secondary | ICD-10-CM

## 2016-03-16 DIAGNOSIS — M5416 Radiculopathy, lumbar region: Secondary | ICD-10-CM | POA: Insufficient documentation

## 2016-03-16 NOTE — Therapy (Signed)
[facility] [address] [place], [place], [address] Phone: [phone]   Fax:  [phone]  Physical Therapy Evaluation  Patient Details  Name: John Keith MRN: [id number] Date of Birth: [birth year] Referring Provider: Dr. Althia Forts   Encounter Date: 03/16/2016      PT End of Session - 03/16/16 1000    Visit Number 1   Number of Visits 12   Date for PT Re-Evaluation 04/27/16   PT Start Time 0800   PT Stop Time 0845   PT Time Calculation (min) 45 min   Activity Tolerance Patient tolerated treatment well   Behavior During Therapy [facility] for tasks assessed/performed      Past Medical History:  Diagnosis Date  . Allergy   . Arthritis   . Carpal tunnel syndrome on left   . DDD (degenerative disc disease), thoracolumbar    unknown location   . DDD (degenerative disc disease), thoracolumbar    multilevel  . Degenerative joint disease of left shoulder   . Degenerative joint disease of left shoulder    02/2011   . Diverticulosis    left colon (2008)   . ED (erectile dysfunction)    02/2011   . GERD (gastroesophageal reflux disease)   . H. pylori infection   . Hyperlipidemia   . Hypertension   . Lactose intolerance   . Smoking    smoking since age 30 y.o  . Vitamin D deficiency     Past Surgical History:  Procedure Laterality Date  . APPENDECTOMY    . COLONOSCOPY     2008 diverticulosis  . ESOPHAGOGASTRODUODENOSCOPY     h/o +h. pylori  . OTHER SURGICAL HISTORY     heart catherization 1999 normal   . SPINE SURGERY     x 2     There were no vitals filed for this visit.       Subjective Assessment - 03/16/16 0804    Subjective Patient reports onset of low back and Rt LE pain which began about 2 weeks ago. Pain radiated into Rt. thigh to knee.  He noticed some weakness generally and Rt. prox hip numbness.  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     Pertinent History 1995  disc , Lt CTS with surgery.    Limitations Sitting;Lifting;Standing   How long can you sit comfortably? <10 min    How long can you stand comfortably? <15 min    How long can you walk comfortably? walking makes it better, not limited now    Diagnostic tests XR lumbar spine and Rt. hip   Currently in Pain? Yes   Pain Score 3    Pain Location Back   Pain Orientation Lower   Pain Descriptors / Indicators Throbbing;Aching;Numbness   Pain Type Acute pain   Pain Onset 1 to 4 weeks ago   Pain Frequency Intermittent   Aggravating Factors  sitting, lifting    Pain Relieving Factors pain gel (Voltaren), heat    Effect of Pain on Daily Activities work and recreation             [facility] PT Assessment - 03/16/16 0811      Assessment   Medical Diagnosis acute lumbar radiculopathy    Referring Provider Dr. Althia Forts    Onset Date/Surgical Date --  2 weeks ago    Next MD Visit next week   Prior Therapy No      Precautions  Precautions None     Restrictions   Weight Bearing Restrictions No     Balance Screen   Has the patient fallen in the past 6 months No     Home Environment   Living Environment Private residence   Living Arrangements Spouse/significant other   Additional Comments spouse has a "ruptured disc"      Prior Function   Level of Independence Independent   Vocation Full time employment   Leisure grandkids     Cognition   Overall Cognitive Status Within Functional Limits for tasks assessed     Observation/Other Assessments   Focus on Therapeutic Outcomes (FOTO)  NT      Sensation   Additional Comments numb upper thigh R.      Posture/Postural Control   Posture/Postural Control Postural limitations   Postural Limitations Decreased lumbar lordosis;Right pelvic obliquity;Flexed trunk   Posture Comments high L shoulder      AROM   Lumbar Flexion 80 deg   Lumbar Extension 25 deg    Lumbar - Right Side Bend min motion with pain    Lumbar - Left Side Bend  50%   Lumbar - Right Rotation 50% pain    Lumbar - Left Rotation 50%      Strength   Right Hip Flexion 4+/5   Right Hip Extension 4+/5   Right Hip ABduction 3+/5   Left Hip Flexion 5/5   Left Hip Extension 4+/5   Left Hip ABduction 4+/5   Right/Left Knee --  WNL   Right/Left Ankle --  WNL     Palpation   Spinal mobility pain, stiff L2 to L5    Palpation comment sore in Rt.L3-L4-L5 and into superior gluteals   lg area of tightness Rt. low thoracic and upper lumbar                  PT Education - 03/16/16 0959    Education provided Yes   Education Details PT/POC, HEP and posture   Person(s) Educated Patient   Methods Explanation;Demonstration;Verbal cues;Handout   Comprehension Verbalized understanding;Returned demonstration;Need further instruction          PT Short Term Goals - 03/16/16 1004      PT SHORT TERM GOAL #1   Title Pt will be I with HEP for trunk stretching, simple core.    Time 3   Period Weeks   Status New     PT SHORT TERM GOAL #2   Title Pt will report less pain with sitting longer periods (no more than 5/10)    Time 3   Period Weeks   Status New           PT Long Term Goals - 03/16/16 1005      PT LONG TERM GOAL #1   Title Pt will be I with more advanced HEP and concepts of lifting, body mechanics for safety at work.    Time 6   Status New     PT LONG TERM GOAL #2   Title Pt will sit, stand as needed without increase in back/LE symptoms for work and recreational activities.    Time 6   Period Weeks   Status New     PT LONG TERM GOAL #3   Title Pt wil report no numbness in Rt.  LE >80% of the time.    Time 6   Period Weeks   Status New     PT LONG TERM GOAL #4   Title Pt  will be able to lift items up to 50 lbs with good form, no increase in back pain.    Time 6   Period Weeks   Status New               Plan - March 27, 2016 1000    Clinical Impression Statement Pt presents with low complexity evaluation for  acute onset of lumbar radiculopathy.  He was given Voltaren gel which has helped his pain and function a great deal at work.  He cont to have abnormal posture, weakness in hips and core and inflammation in lower Rt. lumbar spine, causing abnormal sensation in Rt. prox ant thigh.    Rehab Potential Excellent   PT Frequency 2x / week   PT Duration 6 weeks  4-6 weeks if needed   PT Treatment/Interventions ADLs/Self Care Home Management;Moist Heat;Traction;Therapeutic activities;Dry needling;Therapeutic exercise;Ultrasound;Manual techniques;Taping;Patient/family education;Neuromuscular re-education;Functional mobility training;Electrical Stimulation;Cryotherapy   PT Next Visit Plan check HEP, try manual traction to Rt. LE, begin core, modalities as needed, manual   PT Home Exercise Plan childs pose/sink stretch and Rt. QL in sidelying    Consulted and Agree with Plan of Care Patient      Patient will benefit from skilled therapeutic intervention in order to improve the following deficits and impairments:  Decreased mobility, Decreased strength, Impaired sensation, Postural dysfunction, Improper body mechanics, Hypomobility, Pain, Increased fascial restricitons  Visit Diagnosis: Radiculopathy, lumbar region  Muscle weakness (generalized)  Right-sided low back pain without sciatica      G-Codes - 27-Mar-2016 1010    Functional Assessment Tool Used clinical judgement    Functional Limitation Changing and maintaining body position   Changing and Maintaining Body Position Current Status (Z6109) At least 40 percent but less than 60 percent impaired, limited or restricted   Changing and Maintaining Body Position Goal Status (U0454) At least 20 percent but less than 40 percent impaired, limited or restricted       Problem List Patient Active Problem List   Diagnosis Date Noted  . Constipation 03/10/2016  . Lumbar radiculopathy, acute 02/20/2016  . Encounter for immunization 11/26/2015  .  Insomnia 06/10/2015  . External hemorrhoid 03/07/2015  . Health care maintenance 01/09/2014  . Carpal tunnel syndrome of left wrist 01/03/2013  . Dyslipidemia 11/09/2012  . Vitamin D deficiency 11/09/2012  . HTN (hypertension) 11/06/2012  . Tobacco abuse 11/06/2012  . Seasonal allergies 11/06/2012  . Erectile dysfunction 11/06/2012  . GERD (gastroesophageal reflux disease) 11/06/2012  . DDD (degenerative disc disease), thoracolumbar 11/06/2012  . Lactose intolerance 11/06/2012  . Diverticulosis of colon without hemorrhage 11/06/2012    PAA,JENNIFER 03/27/2016, 10:14 AM  [facility] [address] [place], [place], [address] Phone: [phone]   Fax:  [phone]  Name: John Keith MRN: [id number] Date of Birth: 04/22/1951  Karie Mainland, PT 03/27/16 10:14 AM Phone: [phone] Fax: [phone]

## 2016-03-16 NOTE — Patient Instructions (Signed)
QL stretch , Child's pose from cabinet and sink stretch for decompression of thoracolumbar spine

## 2016-03-18 ENCOUNTER — Ambulatory Visit: Payer: BLUE CROSS/BLUE SHIELD | Admitting: Physical Therapy

## 2016-03-18 DIAGNOSIS — M545 Low back pain, unspecified: Secondary | ICD-10-CM

## 2016-03-18 DIAGNOSIS — M5416 Radiculopathy, lumbar region: Secondary | ICD-10-CM

## 2016-03-18 DIAGNOSIS — M6281 Muscle weakness (generalized): Secondary | ICD-10-CM

## 2016-03-18 NOTE — Therapy (Addendum)
[facility] [address] [place], [place], [address] Phone: [phone]   Fax:  [phone]  Physical Therapy Treatment  Patient Details  Name: John Keith MRN: [id number] Date of Birth: 01/17/1951 Referring Provider: Dr. Althia FortsAdam Johnson   Encounter Date: 03/18/2016      PT End of Session - 03/18/16 0902    Visit Number 2   Number of Visits 12   Date for PT Re-Evaluation 04/27/16   PT Start Time 0801   PT Stop Time 0847   PT Time Calculation (min) 46 min      Past Medical History:  Diagnosis Date  . Allergy   . Arthritis   . Carpal tunnel syndrome on left   . DDD (degenerative disc disease), thoracolumbar    unknown location   . DDD (degenerative disc disease), thoracolumbar    multilevel  . Degenerative joint disease of left shoulder   . Degenerative joint disease of left shoulder    02/2011   . Diverticulosis    left colon (2008)   . ED (erectile dysfunction)    02/2011   . GERD (gastroesophageal reflux disease)   . H. pylori infection   . Hyperlipidemia   . Hypertension   . Lactose intolerance   . Smoking    smoking since age 65 y.o  . Vitamin D deficiency     Past Surgical History:  Procedure Laterality Date  . APPENDECTOMY    . COLONOSCOPY     2008 diverticulosis  . ESOPHAGOGASTRODUODENOSCOPY     h/o +h. pylori  . OTHER SURGICAL HISTORY     heart catherization 1999 normal   . SPINE SURGERY     x 2     There were no vitals filed for this visit.      Subjective Assessment - 03/18/16 0808    Currently in Pain? Yes   Pain Score 3    Pain Location Leg   Pain Orientation Right   Pain Descriptors / Indicators Burning                         [facility] Adult PT Treatment/Exercise - 03/18/16 0001      Self-Care   Self-Care Other Self-Care Comments   Other Self-Care Comments  Body mechanics and affect on spine, avoid lumbar flexion at work      Exercises   Exercises Lumbar     Lumbar Exercises: Stretches   Standing Extension 3 reps;10 seconds   Prone on Elbows Stretch 1 rep;60 seconds     Lumbar Exercises: Standing   Other Standing Lumbar Exercises hip abduction 10 x 2 max cues for posture, avoid compensations     Lumbar Exercises: Supine   Ab Set 10 reps   AB Set Limitations max cues   Clam 10 reps   Clam Limitations mac cues for breathing    Other Supine Lumbar Exercises decompression LE press 5sec x 3 each side, Leg lengthener 5 sec 3x2 each side     Lumbar Exercises: Prone   Straight Leg Raise 10 reps   Straight Leg Raises Limitations increased leg pain so dc     Manual Therapy   Manual therapy comments LE distraction RT and Lt - pt reports relief                 PT Education - 03/18/16 0922    Education provided Yes   Education Details Ab brace, clam   Person(s) Educated  Patient   Methods Explanation;Handout   Comprehension Verbalized understanding          PT Short Term Goals - 03/16/16 1004      PT SHORT TERM GOAL #1   Title Pt will be I with HEP for trunk stretching, simple core.    Time 3   Period Weeks   Status New     PT SHORT TERM GOAL #2   Title Pt will report less pain with sitting longer periods (no more than 5/10)    Time 3   Period Weeks   Status New           PT Long Term Goals - 03/16/16 1005      PT LONG TERM GOAL #1   Title Pt will be I with more advanced HEP and concepts of lifting, body mechanics for safety at work.    Time 6   Status New     PT LONG TERM GOAL #2   Title Pt will sit, stand as needed without increase in back/LE symptoms for work and recreational activities.    Time 6   Period Weeks   Status New     PT LONG TERM GOAL #3   Title Pt wil report no numbness in Rt.  LE >80% of the time.    Time 6   Period Weeks   Status New     PT LONG TERM GOAL #4   Title Pt will be able to lift items up to 50 lbs with good form, no increase in back pain.    Time 6   Period Weeks   Status  New               Plan - 03/18/16 0981    Clinical Impression Statement Various positions attempted to to decrease pain. He feels prone on elbows and lumar extension in standing decreases his leg pain, however press ups or hip extension in prone increased his leg pain after several reps. Rt LE distraction relieved pain however active leg lengthener and decompression LE press increased leg pain. We reviewed sink stretch and child pose both which he has not attempted at home. He has tried side QL stretch with good results. Manual today to Right QL in sidelying, tender. Began hooklying core bracing and attempted clams. Pt with difficulty coordinating movement. Pt given abdominal bracing for HEP. He reports pain a little better after treatment. We briefly discussed body mechanics for work and importance of hip hinge instead of lumbar flexion.    PT Next Visit Plan check HEP, try manual traction to Rt. LE, review and progress core as able, modalities as needed, manual to right lumbar, Give body mechanics handout.    PT Home Exercise Plan childs pose/sink stretch and Rt. QL in sidelying , ab brace      Patient will benefit from skilled therapeutic intervention in order to improve the following deficits and impairments:  Decreased mobility, Decreased strength, Impaired sensation, Postural dysfunction, Improper body mechanics, Hypomobility, Pain, Increased fascial restricitons  Visit Diagnosis: Radiculopathy, lumbar region  Muscle weakness (generalized)  Right-sided low back pain without sciatica     Problem List Patient Active Problem List   Diagnosis Date Noted  . Constipation 03/10/2016  . Lumbar radiculopathy, acute 02/20/2016  . Encounter for immunization 11/26/2015  . Insomnia 06/10/2015  . External hemorrhoid 03/07/2015  . Health care maintenance 01/09/2014  . Carpal tunnel syndrome of left wrist 01/03/2013  . Dyslipidemia 11/09/2012  . Vitamin D  deficiency 11/09/2012  . HTN  (hypertension) 11/06/2012  . Tobacco abuse 11/06/2012  . Seasonal allergies 11/06/2012  . Erectile dysfunction 11/06/2012  . GERD (gastroesophageal reflux disease) 11/06/2012  . DDD (degenerative disc disease), thoracolumbar 11/06/2012  . Lactose intolerance 11/06/2012  . Diverticulosis of colon without hemorrhage 11/06/2012    Sherrie Mustache, [place] 03/18/2016, 9:22 AM  [facility] [address] [place], [place], [address] Phone: [phone]   Fax:  [phone]  Name: John Keith MRN: [id number] Date of Birth: 02-Feb-1951

## 2016-03-22 ENCOUNTER — Ambulatory Visit (INDEPENDENT_AMBULATORY_CARE_PROVIDER_SITE_OTHER): Payer: BLUE CROSS/BLUE SHIELD | Admitting: Internal Medicine

## 2016-03-22 DIAGNOSIS — M5416 Radiculopathy, lumbar region: Secondary | ICD-10-CM

## 2016-03-22 DIAGNOSIS — M47816 Spondylosis without myelopathy or radiculopathy, lumbar region: Secondary | ICD-10-CM | POA: Insufficient documentation

## 2016-03-22 DIAGNOSIS — K5900 Constipation, unspecified: Secondary | ICD-10-CM | POA: Diagnosis not present

## 2016-03-22 NOTE — Progress Notes (Signed)
   CC: Back pain follow up  HPI:  John Keith is a 65 y.o. male with PMHx detailed below presenting for follow up of his back/hip pain secondary to lumbar osteoarthritis and radiculopathy. Patient was seen last week and started on diclofenac gel, referred for PT, and sent for imaging. Plain films of his lumbar spine and hips showed moderate disc space narrowing L4-5, prominent osseous spurring, lower lumbar facet arthropathy, and mild osteoarthritis of both hips. His pain has significantly improved with the diclofenac gel, which he applies 3 times daily, and he was able to work his full shift every day last week.   See problem based assessment and plan below for additional details.  Past Medical History:  Diagnosis Date  . Allergy   . Arthritis   . Carpal tunnel syndrome on left   . DDD (degenerative disc disease), thoracolumbar    unknown location   . DDD (degenerative disc disease), thoracolumbar    multilevel  . Degenerative joint disease of left shoulder   . Degenerative joint disease of left shoulder    02/2011   . Diverticulosis    left colon (2008)   . ED (erectile dysfunction)    02/2011   . GERD (gastroesophageal reflux disease)   . H. pylori infection   . Hyperlipidemia   . Hypertension   . Lactose intolerance   . Smoking    smoking since age 57 y.o  . Vitamin D deficiency     Review of Systems: Review of Systems  Constitutional: Negative for chills and fever.  Respiratory: Negative for shortness of breath.   Cardiovascular: Negative for chest pain.  Gastrointestinal: Negative for constipation and diarrhea.  Musculoskeletal: Positive for back pain (improving) and joint pain.     Physical Exam: Vitals:   03/22/16 0821  BP: 138/83  Pulse: 74  Temp: 98.3 F (36.8 C)  TempSrc: Oral  SpO2: 99%  Weight: 220 lb 3.2 oz (99.9 kg)  Height: 6\' 9"  (2.057 m)   Body mass index is 23.6 kg/m. GENERAL- Tall slim gentleman sitting comfortably in exam room chair,  alert, in no distress HEENT- Atraumatic, PERRL, EOMI, moist mucous membranes CARDIAC- Regular rate and rhythm, no murmurs, rubs or gallops. RESP- Clear to ascultation bilaterally, no wheezing or crackles, normal work of breathing ABDOMEN- Normoactive bowel sounds, soft, nontender, nondistended BACK- Normal curvature, mild tenderness to palpation over lumbar spine extending to right hip, normal muscle tone EXTREMITIES- Normal bulk and range of motion, no edema, 2+ peripheral pulses SKIN- Warm, dry, intact, without visible rash PSYCH- Appropriate affect, clear speech, thoughts linear and goal-directed  Assessment & Plan:   See encounters tab for problem based medical decision making.  Patient seen with Dr. Cleda Daub

## 2016-03-22 NOTE — Assessment & Plan Note (Signed)
Resolved since last visit, taking Senokot-S every other day to effect. Also reports increased fiber intake and eating more fruits. Now having normal-consistence bowel movement every day.  Plan: - Continue Senokot-S PRN - Encourage continued fiber intake

## 2016-03-22 NOTE — Patient Instructions (Addendum)
Please continue to take your medications as prescribed and work with physical therapy to improve your back/thigh pain.   If you have any questions or concerns, please call the clinic at [phone].  Thank you for visiting our clinic today!

## 2016-03-22 NOTE — Assessment & Plan Note (Addendum)
Near complete symptom resolution with the use of diclofenac gel since last week. Pain 1/10 today, states that he was able to work his full shift every night last week with only mild soreness at the end of his shift. Applies the gel to across his lower back and on his upper right thigh 3x daily. He continues to have hip soreness with prolonged standing and mild gait changes. He is scheduled to start physical therapy tomorrow. Plain films obtained last visit showed moderate disc space narrowing L4-5, prominent osseous spurring, lower lumbar facet arthropathy, and mild osteoarthritis of both hips consistent with degenerative joint disease. On exam he continues to have mild tenderness to palpation over lumbar spine and right hip, mild gait changes, overall much improved.   Plan: - Continue diclofenac 1% gel, 4g, up to 4 times daily as needed - Continue Flexeril 10mg  TID PRN - Physical therapy - Follow up at PCP visit

## 2016-03-22 NOTE — Progress Notes (Signed)
Internal Medicine Clinic Attending  I saw and evaluated the patient.  I personally confirmed the key portions of the history and exam documented by Dr. Johnson and I reviewed pertinent patient test results.  The assessment, diagnosis, and plan were formulated together and I agree with the documentation in the resident's note.  

## 2016-03-23 ENCOUNTER — Ambulatory Visit: Payer: BLUE CROSS/BLUE SHIELD | Admitting: Physical Therapy

## 2016-03-23 DIAGNOSIS — M5416 Radiculopathy, lumbar region: Secondary | ICD-10-CM

## 2016-03-23 DIAGNOSIS — M545 Low back pain, unspecified: Secondary | ICD-10-CM

## 2016-03-23 DIAGNOSIS — M6281 Muscle weakness (generalized): Secondary | ICD-10-CM

## 2016-03-23 NOTE — Therapy (Signed)
[facility] Outpatient Rehabilitation [facility] [address] [place], [place], [address] Phone: [phone]   Fax:  [phone]  Physical Therapy Treatment  Patient Details  Name: John Keith MRN: [id number] Date of Birth: 1951-07-05 Referring Provider: Dr. Althia Forts   Encounter Date: 03/23/2016      PT End of Session - 03/23/16 0813    Visit Number 3   Number of Visits 12   Date for PT Re-Evaluation 04/27/16   PT Start Time 0800   PT Stop Time 0848   PT Time Calculation (min) 48 min      Past Medical History:  Diagnosis Date  . Allergy   . Arthritis   . Carpal tunnel syndrome on left   . DDD (degenerative disc disease), thoracolumbar    unknown location   . DDD (degenerative disc disease), thoracolumbar    multilevel  . Degenerative joint disease of left shoulder   . Degenerative joint disease of left shoulder    02/2011   . Diverticulosis    left colon (2008)   . ED (erectile dysfunction)    02/2011   . GERD (gastroesophageal reflux disease)   . H. pylori infection   . Hyperlipidemia   . Hypertension   . Lactose intolerance   . Smoking    smoking since age 21 y.o  . Vitamin D deficiency     Past Surgical History:  Procedure Laterality Date  . APPENDECTOMY    . COLONOSCOPY     2008 diverticulosis  . ESOPHAGOGASTRODUODENOSCOPY     h/o +h. pylori  . OTHER SURGICAL HISTORY     heart catherization 1999 normal   . SPINE SURGERY     x 2     There were no vitals filed for this visit.      Subjective Assessment - 03/23/16 0925    Subjective I'm better.    Currently in Pain? Yes   Pain Score 1    Pain Location Buttocks   Pain Orientation Right;Posterior   Pain Descriptors / Indicators Sore   Aggravating Factors  sitting, lifting   Pain Relieving Factors voltaren gel, avoing bending                         [facility] Adult PT Treatment/Exercise - 03/23/16 0001      Self-Care   Other Self-Care Comments  Body  Mechanics Handout reviewed and strategies to decrease right side strain at work      Lumbar Exercises: Stretches   Single Knee to Chest Stretch 3 reps;30 seconds   Pelvic Tilt 5 reps;10 seconds   Piriformis Stretch 3 reps;30 seconds     Lumbar Exercises: Aerobic   Stationary Bike Nustep L5 x 6 minutes     Lumbar Exercises: Supine   Clam 10 reps   Clam Limitations mac cues for breathing    Heel Slides 10 reps   Heel Slides Limitations with ab draw in   Bent Knee Raise 10 reps   Bent Knee Raise Limitations with ab draw in   Bridge 10 reps   Bridge Limitations with ab draw in   Straight Leg Raise 10 reps   Straight Leg Raises Limitations with ab set     Lumbar Exercises: Sidelying   Clam 20 reps                  PT Short Term Goals - 03/23/16 0814      PT SHORT TERM GOAL #1  Title Pt will be I with HEP for trunk stretching, simple core.    Time 3   Period Weeks   Status On-going     PT SHORT TERM GOAL #2   Title Pt will report less pain with sitting longer periods (no more than 5/10)    Time 3   Period Weeks   Status On-going           PT Long Term Goals - 03/23/16 1610      PT LONG TERM GOAL #1   Title Pt will be I with more advanced HEP and concepts of lifting, body mechanics for safety at work.    Time 6   Status On-going     PT LONG TERM GOAL #2   Title Pt will sit, stand as needed without increase in back/LE symptoms for work and recreational activities.    Baseline improving with body mechanics awareness   Time 6   Period Weeks   Status On-going     PT LONG TERM GOAL #3   Title Pt wil report no numbness in Rt.  LE >80% of the time.    Baseline more proximal now   Time 6   Period Weeks   Status On-going     PT LONG TERM GOAL #4   Title Pt will be able to lift items up to 50 lbs with good form, no increase in back pain.    Time 6   Period Weeks   Status On-going               Plan - 03/23/16 0815    Clinical Impression  Statement Pt reports decreased pain. He notices that his pain is related to his body mechanics at work. He works at a [employer] and constantly pulls from the right. 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 N/T moving proximal and only slightly in right groin.  We discussed body mechanics handouts and ways to decrease strain at work.  Progressed HEP to include pilates based core No increased pain.    PT Next Visit Plan check HEP, try manual traction to Rt. LE, review and progress core as able, modalities as needed, manual to right lumbar; add piriformis stretch/ review core, add abduction strength      Patient will benefit from skilled therapeutic intervention in order to improve the following deficits and impairments:  Decreased mobility, Decreased strength, Impaired sensation, Postural dysfunction, Improper body mechanics, Hypomobility, Pain, Increased fascial restricitons  Visit Diagnosis: Radiculopathy, lumbar region  Muscle weakness (generalized)  Right-sided low back pain without sciatica     Problem List Patient Active Problem List   Diagnosis Date Noted  . Degenerative joint disease (DJD) of lumbar spine 03/22/2016  . Constipation 03/10/2016  . Lumbar radiculopathy, acute 02/20/2016  . Encounter for immunization 11/26/2015  . Insomnia 06/10/2015  . External hemorrhoid 03/07/2015  . Health care maintenance 01/09/2014  . Carpal tunnel syndrome of left wrist 01/03/2013  . Dyslipidemia 11/09/2012  . Vitamin D deficiency 11/09/2012  . HTN (hypertension) 11/06/2012  . Tobacco abuse 11/06/2012  . Seasonal allergies 11/06/2012  . Erectile dysfunction 11/06/2012  . GERD (gastroesophageal reflux disease) 11/06/2012  . DDD (degenerative disc disease), thoracolumbar 11/06/2012  . Lactose intolerance 11/06/2012  . Diverticulosis of colon without  hemorrhage 11/06/2012    Sherrie Mustache, PTA 03/23/2016, 9:31 AM  [place]  Outpatient Rehabilitation [facility] [address] [place], [place], [address] Phone: [phone]   Fax:  [phone]  Name: CLIFF ROTHFELD MRN: [id number] Date of Birth: [birth year]

## 2016-03-26 ENCOUNTER — Ambulatory Visit: Payer: BLUE CROSS/BLUE SHIELD | Admitting: Physical Therapy

## 2016-03-26 DIAGNOSIS — M5416 Radiculopathy, lumbar region: Secondary | ICD-10-CM

## 2016-03-26 DIAGNOSIS — M6281 Muscle weakness (generalized): Secondary | ICD-10-CM

## 2016-03-26 DIAGNOSIS — M545 Low back pain, unspecified: Secondary | ICD-10-CM

## 2016-03-26 NOTE — Therapy (Signed)
[facility] Outpatient Rehabilitation [facility] [address] [place], [place], [address] Phone: [phone]   Fax:  [phone]  Physical Therapy Treatment  Patient Details  Name: John Keith MRN: [id number] Date of Birth: 1950/09/10 Referring Provider: Dr. Althia Forts   Encounter Date: 03/26/2016      PT End of Session - 03/26/16 0754    Visit Number 4   Number of Visits 12   Date for PT Re-Evaluation 04/27/16   PT Start Time 0800   PT Stop Time 0846   PT Time Calculation (min) 46 min   Activity Tolerance Patient tolerated treatment well   Behavior During Therapy [facility] for tasks assessed/performed      Past Medical History:  Diagnosis Date  . Allergy   . Arthritis   . Carpal tunnel syndrome on left   . DDD (degenerative disc disease), thoracolumbar    unknown location   . DDD (degenerative disc disease), thoracolumbar    multilevel  . Degenerative joint disease of left shoulder   . Degenerative joint disease of left shoulder    02/2011   . Diverticulosis    left colon (2008)   . ED (erectile dysfunction)    02/2011   . GERD (gastroesophageal reflux disease)   . H. pylori infection   . Hyperlipidemia   . Hypertension   . Lactose intolerance   . Smoking    smoking since age 50 y.o  . Vitamin D deficiency     Past Surgical History:  Procedure Laterality Date  . APPENDECTOMY    . COLONOSCOPY     2008 diverticulosis  . ESOPHAGOGASTRODUODENOSCOPY     h/o +h. pylori  . OTHER SURGICAL HISTORY     heart catherization 1999 normal   . SPINE SURGERY     x 2     There were no vitals filed for this visit.      Subjective Assessment - 03/26/16 0801    Subjective I have no pain today.  Feel it a little bit, then I do my exercises and it goes away! I don't want to be operated on   Currently in Pain? No/denies            [facility] Adult PT Treatment/Exercise - 03/26/16 0802      Lumbar Exercises: Aerobic   Stationary Bike NuStep L5 UE and  LE for 5 min      Lumbar Exercises: Supine   Clam 10 reps   Clam Limitations max cues for breathing    Heel Slides 10 reps   Heel Slides Limitations with ab draw in   Bent Knee Raise 10 reps   Bent Knee Raise Limitations with ab draw in   Bridge 10 reps   Bridge Limitations with ab draw in   Straight Leg Raise 10 reps   Straight Leg Raises Limitations with ab set   Other Supine Lumbar Exercises max cues for pelvic position, difficulty finding neutral   tucks into PPT naturally     Lumbar Exercises: Sidelying   Clam 20 reps   Clam Limitations blue    Hip Abduction 20 reps   Hip Abduction Weights (lbs) blue    Other Sidelying Lumbar Exercises max cues to prevent rolling back      Manual Therapy   Manual Therapy Soft tissue mobilization;Myofascial release   Soft tissue mobilization R umbar paraspinals and superior lateral glutes, deep pressure    Myofascial Release Rt. trunk  PT Short Term Goals - 03/23/16 1610      PT SHORT TERM GOAL #1   Title Pt will be I with HEP for trunk stretching, simple core.    Time 3   Period Weeks   Status On-going     PT SHORT TERM GOAL #2   Title Pt will report less pain with sitting longer periods (no more than 5/10)    Time 3   Period Weeks   Status On-going           PT Long Term Goals - 03/23/16 9604      PT LONG TERM GOAL #1   Title Pt will be I with more advanced HEP and concepts of lifting, body mechanics for safety at work.    Time 6   Status On-going     PT LONG TERM GOAL #2   Title Pt will sit, stand as needed without increase in back/LE symptoms for work and recreational activities.    Baseline improving with body mechanics awareness   Time 6   Period Weeks   Status On-going     PT LONG TERM GOAL #3   Title Pt wil report no numbness in Rt.  LE >80% of the time.    Baseline more proximal now   Time 6   Period Weeks   Status On-going     PT LONG TERM GOAL #4   Title Pt will be able  to lift items up to 50 lbs with good form, no increase in back pain.    Time 6   Period Weeks   Status On-going               Plan - 03/26/16 1136    Clinical Impression Statement Patient needs max cues to maintain neutral with stabilization exercises.  He is doing well, pain today only with palpation.  He reports changing his habits at work to avoid pain exacerbation.  He can do his exercise routine and decrease his pain.     PT Next Visit Plan check HEP, try manual traction to Rt. LE, review and progress core as able, modalities as needed, manual to right lumbar; add piriformis stretch/ review core, add abduction strength   PT Home Exercise Plan childs pose/sink stretch and Rt. QL in sidelying , ab brace, clam, bridge and 90/90 abd    Consulted and Agree with Plan of Care Patient      Patient will benefit from skilled therapeutic intervention in order to improve the following deficits and impairments:  Decreased mobility, Decreased strength, Impaired sensation, Postural dysfunction, Improper body mechanics, Hypomobility, Pain, Increased fascial restricitons  Visit Diagnosis: Radiculopathy, lumbar region  Muscle weakness (generalized)  Right-sided low back pain without sciatica     Problem List Patient Active Problem List   Diagnosis Date Noted  . Degenerative joint disease (DJD) of lumbar spine 03/22/2016  . Constipation 03/10/2016  . Lumbar radiculopathy, acute 02/20/2016  . Encounter for immunization 11/26/2015  . Insomnia 06/10/2015  . External hemorrhoid 03/07/2015  . Health care maintenance 01/09/2014  . Carpal tunnel syndrome of left wrist 01/03/2013  . Dyslipidemia 11/09/2012  . Vitamin D deficiency 11/09/2012  . HTN (hypertension) 11/06/2012  . Tobacco abuse 11/06/2012  . Seasonal allergies 11/06/2012  . Erectile dysfunction 11/06/2012  . GERD (gastroesophageal reflux disease) 11/06/2012  . DDD (degenerative disc disease), thoracolumbar 11/06/2012  .  Lactose intolerance 11/06/2012  . Diverticulosis of colon without hemorrhage 11/06/2012    Kelly Eisler 03/26/2016, 11:41 AM  [facility] Outpatient Rehabilitation [facility] [address] [place], [place], [address] Phone: [phone]   Fax:  [phone]  Name: John Keith MRN: [id number] Date of Birth: 1951/05/10  Karie Mainland, PT 03/26/16 11:41 AM Phone: [phone] Fax: [phone]

## 2016-03-30 ENCOUNTER — Ambulatory Visit: Payer: BLUE CROSS/BLUE SHIELD | Admitting: Physical Therapy

## 2016-03-30 DIAGNOSIS — M6281 Muscle weakness (generalized): Secondary | ICD-10-CM

## 2016-03-30 DIAGNOSIS — M5416 Radiculopathy, lumbar region: Secondary | ICD-10-CM | POA: Diagnosis not present

## 2016-03-30 DIAGNOSIS — M545 Low back pain, unspecified: Secondary | ICD-10-CM

## 2016-03-30 NOTE — Therapy (Signed)
[facility] Outpatient Rehabilitation [facility] [address] [place], [place], [address] Phone: [phone]   Fax:  [phone]  Physical Therapy Treatment  Patient Details  Name: John Keith MRN: [id number] Date of Birth: 02/16/1951 Referring Provider: Dr. Althia Forts   Encounter Date: 03/30/2016      PT End of Session - 03/30/16 1005    Visit Number 5   Number of Visits 12   Date for PT Re-Evaluation 04/27/16   PT Start Time 0803   PT Stop Time 0847   PT Time Calculation (min) 44 min   Activity Tolerance Patient tolerated treatment well   Behavior During Therapy [facility] for tasks assessed/performed      Past Medical History:  Diagnosis Date  . Allergy   . Arthritis   . Carpal tunnel syndrome on left   . DDD (degenerative disc disease), thoracolumbar    unknown location   . DDD (degenerative disc disease), thoracolumbar    multilevel  . Degenerative joint disease of left shoulder   . Degenerative joint disease of left shoulder    02/2011   . Diverticulosis    left colon (2008)   . ED (erectile dysfunction)    02/2011   . GERD (gastroesophageal reflux disease)   . H. pylori infection   . Hyperlipidemia   . Hypertension   . Lactose intolerance   . Smoking    smoking since age 44 y.o  . Vitamin D deficiency     Past Surgical History:  Procedure Laterality Date  . APPENDECTOMY    . COLONOSCOPY     2008 diverticulosis  . ESOPHAGOGASTRODUODENOSCOPY     h/o +h. pylori  . OTHER SURGICAL HISTORY     heart catherization 1999 normal   . SPINE SURGERY     x 2     There were no vitals filed for this visit.      Subjective Assessment - 03/30/16 0808    Subjective No back or leg pain.  The exercises have helped.  I have a HA(Top of head and ,  woke up with 3/10) New pain yesterday Right armlateral forearm, sore,  a numb feeling that won't go away.  8/10 constant,  gel did not help,  I needed to lift things with left hand, I couldn't lift things  with my right.    Currently in Pain? No/denies   Pain Location Back   Multiple Pain Sites --  See above information.            [facility] PT Assessment - 03/30/16 0830      Strength   Overall Strength Other (comment)  Rt. grip 85 lbs , Lt 89 lbs pt is Rt handed      Palpation   Palpation comment sore Rt. Lateral cervical mm and Rt. suboccipitals     Special Tests    Special Tests Cervical   Cervical Tests Spurling's;Dictraction;Vertebral Artery Test     Spurling's   Findings Negative   Side Right   Comment pain in forearm unchanged     Distraction Test   Findngs Negative   side Right   Comment no change in UE sx     Vertebral Artery Test    Findings Negative   Side Right                     [facility] Adult PT Treatment/Exercise - 03/30/16 0001      Lumbar Exercises: Aerobic   Stationary Bike Nustep, legs  only 5 minutes , L5     Lumbar Exercises: Supine   Other Supine Lumbar Exercises Entire series decompression:  decompression 4 minutes, shoulder press, head press, leg lengthener, leg press 5 X 5 seconds,  1 pillow and nek roll used.      Shoulder Exercises: Supine   Other Supine Exercises Scapular stabilization series, 10 X each,  cues given initialloy for each exercise.narrow grip flexion, horizontal abduction,  sash, ER   ER added to home ex :  able to decrease arm pain with these                PT Education - 03/30/16 0843    Education provided Yes   Education Details Band ER   Person(s) Educated Patient   Methods Explanation;Demonstration;Verbal cues   Comprehension Verbalized understanding;Returned demonstration          PT Short Term Goals - 03/30/16 1008      PT SHORT TERM GOAL #1   Title Pt will be I with HEP for trunk stretching, simple core.    Status On-going     PT SHORT TERM GOAL #2   Title Pt will report less pain with sitting longer periods (no more than 5/10)    Baseline no back pain with sitting, now has RT arm  pain   Time 3   Period Weeks   Status On-going           PT Long Term Goals - 03/23/16 6578      PT LONG TERM GOAL #1   Title Pt will be I with more advanced HEP and concepts of lifting, body mechanics for safety at work.    Time 6   Status On-going     PT LONG TERM GOAL #2   Title Pt will sit, stand as needed without increase in back/LE symptoms for work and recreational activities.    Baseline improving with body mechanics awareness   Time 6   Period Weeks   Status On-going     PT LONG TERM GOAL #3   Title Pt wil report no numbness in Rt.  LE >80% of the time.    Baseline more proximal now   Time 6   Period Weeks   Status On-going     PT LONG TERM GOAL #4   Title Pt will be able to lift items up to 50 lbs with good form, no increase in back pain.    Time 6   Period Weeks   Status On-going               Plan - 03/30/16 1005    Clinical Impression Statement New pain assessed By Gayla Doss PT.  Pain relieved with supine ER with band.  Pain r4eturned slightly with sitting.  He will work on posture more to see if this helps.     PT Next Visit Plan Add series of supine scapular stabilization to  HEP, if needed.  Check to see if he has been using good posture.   progress stabilization, core.    PT Home Exercise Plan ER band   Consulted and Agree with Plan of Care Patient      Patient will benefit from skilled therapeutic intervention in order to improve the following deficits and impairments:  Decreased mobility, Decreased strength, Impaired sensation, Postural dysfunction, Improper body mechanics, Hypomobility, Pain, Increased fascial restricitons  Visit Diagnosis: Radiculopathy, lumbar region  Muscle weakness (generalized)  Right-sided low back pain without sciatica  Problem List Patient Active Problem List   Diagnosis Date Noted  . Degenerative joint disease (DJD) of lumbar spine 03/22/2016  . Constipation 03/10/2016  . Lumbar radiculopathy, acute  02/20/2016  . Encounter for immunization 11/26/2015  . Insomnia 06/10/2015  . External hemorrhoid 03/07/2015  . Health care maintenance 01/09/2014  . Carpal tunnel syndrome of left wrist 01/03/2013  . Dyslipidemia 11/09/2012  . Vitamin D deficiency 11/09/2012  . HTN (hypertension) 11/06/2012  . Tobacco abuse 11/06/2012  . Seasonal allergies 11/06/2012  . Erectile dysfunction 11/06/2012  . GERD (gastroesophageal reflux disease) 11/06/2012  . DDD (degenerative disc disease), thoracolumbar 11/06/2012  . Lactose intolerance 11/06/2012  . Diverticulosis of colon without hemorrhage 11/06/2012    Nyellie Yetter PTA 03/30/2016, 10:09 AM  [facility] [address] [place], [place], [address] Phone: [phone]   Fax:  [phone]  Name: ASBURY DRIESENGA MRN: [id number] Date of Birth: [birth year]

## 2016-03-30 NOTE — Patient Instructions (Signed)
     Shoulder Rotation: Double Arm   On back, knees bent, feet flat, elbows tucked at sides, bent 90, hands palms up. Pull hands apart and down toward floor, keeping elbows near sides. Hold momentarily. Slowly return to starting position. Repeat _10__ times. Band color _yellow_____    ( May do without band at work )

## 2016-03-31 ENCOUNTER — Telehealth: Payer: Self-pay | Admitting: Internal Medicine

## 2016-03-31 NOTE — Telephone Encounter (Signed)
AT. REMINDER CALL, LMTCB °

## 2016-04-01 ENCOUNTER — Encounter: Payer: Self-pay | Admitting: Internal Medicine

## 2016-04-01 ENCOUNTER — Encounter: Payer: BLUE CROSS/BLUE SHIELD | Admitting: Internal Medicine

## 2016-04-02 ENCOUNTER — Ambulatory Visit: Payer: BLUE CROSS/BLUE SHIELD | Admitting: Physical Therapy

## 2016-04-02 DIAGNOSIS — M6281 Muscle weakness (generalized): Secondary | ICD-10-CM

## 2016-04-02 DIAGNOSIS — M545 Low back pain, unspecified: Secondary | ICD-10-CM

## 2016-04-02 DIAGNOSIS — M5416 Radiculopathy, lumbar region: Secondary | ICD-10-CM

## 2016-04-02 NOTE — Therapy (Signed)
[place], [place], [address] Phone: [phone]   Fax:  [phone]  Physical Therapy Treatment  Patient Details  Name: John Keith MRN: [id number] Date of Birth: [birth year] Referring Provider: Dr. Asencion Partridge   Encounter Date: 04/02/2016      PT End of Session - 04/02/16 0804    Visit Number 6   Number of Visits 12   Date for PT Re-Evaluation 04/27/16   PT Start Time 0800   PT Stop Time 0848   PT Time Calculation (min) 48 min      Past Medical History:  Diagnosis Date  . Allergy   . Arthritis   . Carpal tunnel syndrome on left   . DDD (degenerative disc disease), thoracolumbar    unknown location   . DDD (degenerative disc disease), thoracolumbar    multilevel  . Degenerative joint disease of left shoulder   . Degenerative joint disease of left shoulder    02/2011   . Diverticulosis    left colon (2008)   . ED (erectile dysfunction)    02/2011   . GERD (gastroesophageal reflux disease)   . H. pylori infection   . Hyperlipidemia   . Hypertension   . Lactose intolerance   . Smoking    smoking since age 65 y.o  . Vitamin D deficiency     Past Surgical History:  Procedure Laterality Date  . APPENDECTOMY    . COLONOSCOPY     2008 diverticulosis  . ESOPHAGOGASTRODUODENOSCOPY     h/o +h. pylori  . OTHER SURGICAL HISTORY     heart catherization 1999 normal   . SPINE SURGERY     x 2     There were no vitals filed for this visit.      Subjective Assessment - 04/02/16 0903    Subjective I have no pain. None.    Currently in Pain? No/denies                         [facility] Adult PT Treatment/Exercise - 04/02/16 0001      Therapeutic Activites    Therapeutic Activities Lifting   Lifting Worked on lifting mechanics from knee to waist. Max cues for hip hinge. Able to lift 25# from knee to waist with good mechaincs however requires cues. Hip hinge squats 10 x 2 and  from sit-stand x 5 from elevated mat all focusing on body mechanics.      Lumbar Exercises: Stretches   Prone on Elbows Stretch 1 rep;60 seconds     Lumbar Exercises: Aerobic   Stationary Bike Nustep, legs only 5 minutes , L5     Lumbar Exercises: Supine   Ab Set 10 reps   Heel Slides 10 reps   Heel Slides Limitations with ab draw in, mod/max cues    Bent Knee Raise 10 reps   Bent Knee Raise Limitations with ab draw in, mod/max cues   Bridge 10 reps   Bridge Limitations with ab draw in   Straight Leg Raise 10 reps   Straight Leg Raises Limitations with ab set/ mod /max cues   Other Supine Lumbar Exercises Table Top per HEP, x 10      Lumbar Exercises: Sidelying   Hip Abduction 20 reps   Hip Abduction Weights (lbs) Blue   Hip Abduction Limitations Max cues to prevent roll back    Other Sidelying Lumbar Exercises Right Ql stretch  PT Education - 04/02/16 0904    Education provided Yes   Education Details Reprinted HEP   Person(s) Educated Patient   Methods Explanation;Handout   Comprehension Verbalized understanding          PT Short Term Goals - 04/02/16 0805      PT SHORT TERM GOAL #1   Title Pt will be I with HEP for trunk stretching, simple core.    Time 3   Period Weeks   Status Achieved     PT SHORT TERM GOAL #2   Title Pt will report less pain with sitting longer periods (no more than 5/10)    Baseline no back pain   Time 3   Period Weeks   Status Achieved           PT Long Term Goals - 04/02/16 0908      PT LONG TERM GOAL #1   Title Pt will be I with more advanced HEP and concepts of lifting, body mechanics for safety at work.    Time 6   Period Weeks   Status On-going     PT LONG TERM GOAL #2   Title Pt will sit, stand as needed without increase in back/LE symptoms for work and recreational activities.    Time 6   Period Weeks   Status Achieved     PT LONG TERM GOAL #3   Title Pt wil report no numbness in Rt.  LE  >80% of the time.    Time 6   Period Weeks   Status Achieved     PT LONG TERM GOAL #4   Title Pt will be able to lift items up to 50 lbs with good form, no increase in back pain.    Baseline 25# today- requires cues; he reports he does not need to lift 50 LBS   Period Weeks   Status On-going               Plan - 04/02/16 0904    Clinical Impression Statement Pt reports acute arm pain went away later in the day after last visit and has not returned. He has no back or leg pain. He is not limited by standing, walking or sitting. We focused body mechanics with lifting today. Pt requires max cues for proper hip hinge. Review of HEP reveals Mod to Max cues are still required. Recommended pt continue 1-2 more visits to ensure independence with HEP. STG#1,2 MET. LTG# 2, #3 Met.    PT Next Visit Plan Review HEP again and discharge if independent.       Patient will benefit from skilled therapeutic intervention in order to improve the following deficits and impairments:  Decreased mobility, Decreased strength, Impaired sensation, Postural dysfunction, Improper body mechanics, Hypomobility, Pain, Increased fascial restricitons  Visit Diagnosis: Radiculopathy, lumbar region  Muscle weakness (generalized)  Right-sided low back pain without sciatica     Problem List Patient Active Problem List   Diagnosis Date Noted  . Degenerative joint disease (DJD) of lumbar spine 03/22/2016  . Constipation 03/10/2016  . Lumbar radiculopathy, acute 02/20/2016  . Encounter for immunization 11/26/2015  . Insomnia 06/10/2015  . External hemorrhoid 03/07/2015  . Health care maintenance 01/09/2014  . Carpal tunnel syndrome of left wrist 01/03/2013  . Dyslipidemia 11/09/2012  . Vitamin D deficiency 11/09/2012  . Essential hypertension 11/06/2012  . Tobacco abuse 11/06/2012  . Seasonal allergies 11/06/2012  . Erectile dysfunction 11/06/2012  . GERD (gastroesophageal reflux disease) 11/06/2012  .  DDD (degenerative disc disease), thoracolumbar 11/06/2012  . Lactose intolerance 11/06/2012  . Diverticulosis of colon without hemorrhage 11/06/2012    Dorene Ar, [place] 04/02/2016, 9:10 AM  [place], [place], [address] Phone: [phone]   Fax:  [phone]  Name: JAYANTH SZCZESNIAK MRN: [id number] Date of Birth: 03/22/1951

## 2016-04-06 ENCOUNTER — Ambulatory Visit: Payer: BLUE CROSS/BLUE SHIELD | Attending: Pediatrics | Admitting: Physical Therapy

## 2016-04-06 DIAGNOSIS — M6281 Muscle weakness (generalized): Secondary | ICD-10-CM | POA: Insufficient documentation

## 2016-04-06 DIAGNOSIS — M545 Low back pain, unspecified: Secondary | ICD-10-CM

## 2016-04-06 DIAGNOSIS — M5416 Radiculopathy, lumbar region: Secondary | ICD-10-CM | POA: Diagnosis present

## 2016-04-06 NOTE — Patient Instructions (Signed)
Decompression exercises re issued from exercise drawer.  As previous.

## 2016-04-06 NOTE — Therapy (Signed)
[facility] [address] [place], [place], [address] Phone: [phone]   Fax:  [phone]  Physical Therapy Treatment  Patient Details  Name: John Keith MRN: [id number] Date of Birth: 04-06-1951 Referring Provider: Dr. Althia FortsAdam Johnson   Encounter Date: 04/06/2016      PT End of Session - 04/06/16 1356    Visit Number 7   Number of Visits 12   Date for PT Re-Evaluation 04/27/16   PT Start Time 0802   PT Stop Time 0846   PT Time Calculation (min) 44 min   Activity Tolerance Patient tolerated treatment well   Behavior During Therapy [facility] for tasks assessed/performed      Past Medical History:  Diagnosis Date  . Allergy   . Arthritis   . Carpal tunnel syndrome on left   . DDD (degenerative disc disease), thoracolumbar    unknown location   . DDD (degenerative disc disease), thoracolumbar    multilevel  . Degenerative joint disease of left shoulder   . Degenerative joint disease of left shoulder    02/2011   . Diverticulosis    left colon (2008)   . ED (erectile dysfunction)    02/2011   . GERD (gastroesophageal reflux disease)   . H. pylori infection   . Hyperlipidemia   . Hypertension   . Lactose intolerance   . Smoking    smoking since age 65 y.o  . Vitamin D deficiency     Past Surgical History:  Procedure Laterality Date  . APPENDECTOMY    . COLONOSCOPY     2008 diverticulosis  . ESOPHAGOGASTRODUODENOSCOPY     h/o +h. pylori  . OTHER SURGICAL HISTORY     heart catherization 1999 normal   . SPINE SURGERY     x 2     There were no vitals filed for this visit.      Subjective Assessment - 04/06/16 0823    Subjective No pain.  I have no pain after work.  I can reach with both arms. No headach.    Currently in Pain? No/denies   Pain Location Back                         [facility] Adult PT Treatment/Exercise - 04/06/16 0001      Lumbar Exercises: Aerobic   Stationary Bike Nustep  arms/legs, level 6, 7 minutes     Lumbar Exercises: Seated   Sit to Stand Limitations ON sit fit:: pelvic moibility,  cues,  small      Lumbar Exercises: Supine   Other Supine Lumbar Exercises Entire series decompression:  decompression 4 minutes, shoulder press, head press, leg lengthener, leg press 5 X 5 seconds,  1 pillow and nek roll used.      Shoulder Exercises: Supine   Other Supine Exercises Scapular stabilization series, 10 X each,  cues given initialloy for each exercise.narrow grip flexion, horizontal abduction,  sash, ER   10 X each                  PT Short Term Goals - 04/02/16 0805      PT SHORT TERM GOAL #1   Title Pt will be I with HEP for trunk stretching, simple core.    Time 3   Period Weeks   Status Achieved     PT SHORT TERM GOAL #2   Title Pt will report less pain with sitting longer periods (no more  than 5/10)    Baseline no back pain   Time 3   Period Weeks   Status Achieved           PT Long Term Goals - 04/02/16 0908      PT LONG TERM GOAL #1   Title Pt will be I with more advanced HEP and concepts of lifting, body mechanics for safety at work.    Time 6   Period Weeks   Status On-going     PT LONG TERM GOAL #2   Title Pt will sit, stand as needed without increase in back/LE symptoms for work and recreational activities.    Time 6   Period Weeks   Status Achieved     PT LONG TERM GOAL #3   Title Pt wil report no numbness in Rt.  LE >80% of the time.    Time 6   Period Weeks   Status Achieved     PT LONG TERM GOAL #4   Title Pt will be able to lift items up to 50 lbs with good form, no increase in back pain.    Baseline 25# today- requires cues; he reports he does not need to lift 50 LBS   Period Weeks   Status On-going               Plan - 04/06/16 1356    Clinical Impression Statement Patient requires cues for HEP.  Pain has resolved. Sit fit helpful with pelvic mobility.  Pelvic mobility limited in supine.     PT Next Visit Plan Review HEP again and discharge if independent.    PT Home Exercise Plan continue   Consulted and Agree with Plan of Care Patient      Patient will benefit from skilled therapeutic intervention in order to improve the following deficits and impairments:  Decreased mobility, Decreased strength, Impaired sensation, Postural dysfunction, Improper body mechanics, Hypomobility, Pain, Increased fascial restricitons  Visit Diagnosis: Radiculopathy, lumbar region  Muscle weakness (generalized)  Right-sided low back pain without sciatica, unspecified chronicity     Problem List Patient Active Problem List   Diagnosis Date Noted  . Degenerative joint disease (DJD) of lumbar spine 03/22/2016  . Constipation 03/10/2016  . Lumbar radiculopathy, acute 02/20/2016  . Encounter for immunization 11/26/2015  . Insomnia 06/10/2015  . External hemorrhoid 03/07/2015  . Health care maintenance 01/09/2014  . Carpal tunnel syndrome of left wrist 01/03/2013  . Dyslipidemia 11/09/2012  . Vitamin D deficiency 11/09/2012  . Essential hypertension 11/06/2012  . Tobacco abuse 11/06/2012  . Seasonal allergies 11/06/2012  . Erectile dysfunction 11/06/2012  . GERD (gastroesophageal reflux disease) 11/06/2012  . DDD (degenerative disc disease), thoracolumbar 11/06/2012  . Lactose intolerance 11/06/2012  . Diverticulosis of colon without hemorrhage 11/06/2012    HARRIS,KAREN PTA 04/06/2016, 2:00 PM  [facility] [address] [place], [place], [address] Phone: [phone]   Fax:  [phone]  Name: John Keith MRN: [id number] Date of Birth: 01-30-1951

## 2016-04-08 ENCOUNTER — Ambulatory Visit: Payer: BLUE CROSS/BLUE SHIELD | Admitting: Physical Therapy

## 2016-04-08 DIAGNOSIS — M5416 Radiculopathy, lumbar region: Secondary | ICD-10-CM | POA: Diagnosis not present

## 2016-04-08 DIAGNOSIS — M6281 Muscle weakness (generalized): Secondary | ICD-10-CM

## 2016-04-08 NOTE — Therapy (Addendum)
[place], [place], [address] Phone: [phone]   Fax:  [phone]  Physical Therapy Treatment, Discharge   Patient Details  Name: John Keith MRN: [id number] Date of Birth: Sep 17, 1950 Referring Provider: Dr. Asencion Partridge   Encounter Date: 04/08/2016      PT End of Session - 04/08/16 0908    Visit Number 8   Number of Visits 12   Date for PT Re-Evaluation 04/27/16   PT Start Time 0803   PT Stop Time 0841   PT Time Calculation (min) 38 min   Activity Tolerance Patient tolerated treatment well;No increased pain   Behavior During Therapy WFL for tasks assessed/performed      Past Medical History:  Diagnosis Date  . Allergy   . Arthritis   . Carpal tunnel syndrome on left   . DDD (degenerative disc disease), thoracolumbar    unknown location   . DDD (degenerative disc disease), thoracolumbar    multilevel  . Degenerative joint disease of left shoulder   . Degenerative joint disease of left shoulder    02/2011   . Diverticulosis    left colon (2008)   . ED (erectile dysfunction)    02/2011   . GERD (gastroesophageal reflux disease)   . H. pylori infection   . Hyperlipidemia   . Hypertension   . Lactose intolerance   . Smoking    smoking since age 19 y.o  . Vitamin D deficiency     Past Surgical History:  Procedure Laterality Date  . APPENDECTOMY    . COLONOSCOPY     2008 diverticulosis  . ESOPHAGOGASTRODUODENOSCOPY     h/o +h. pylori  . OTHER SURGICAL HISTORY     heart catherization 1999 normal   . SPINE SURGERY     x 2     There were no vitals filed for this visit.      Subjective Assessment - 04/08/16 0810    Subjective No pain. I   Currently in Pain? No/denies   Pain Location Back                         [facility] Adult PT Treatment/Exercise - 04/08/16 0001      Lumbar Exercises: Aerobic   Stationary Bike Nustep arms/legs, level 6, 7 minutes     Lumbar Exercises: Supine   Clam Limitations Cues for isometric ball squeeze with ab draw in,  trial of ball squeeze and both knees lift 1-2 reps at a time,  max cues.   Heel Slides 10 reps   Heel Slides Limitations with ab draw in, mod/max cues    Bent Knee Raise 10 reps   Bent Knee Raise Limitations with ab draw in, mod/max cues   Bridge 10 reps   Bridge Limitations with ab draw in   Straight Leg Raise 10 reps   Straight Leg Raises Limitations with ab set/ mod /max cues   Other Supine Lumbar Exercises Entire series decompression:  decompression 4 minutes, shoulder press, head press, leg lengthener, leg press 5 X 5 seconds,  1 pillow and nek roll used.      Lumbar Exercises: Sidelying   Clam 10 reps   Clam Limitations Stacked hips,  Top knee stretched longer, difficult, 10 X                  PT Short Term Goals - 04/02/16 0805      PT SHORT TERM  GOAL #1   Title Pt will be I with HEP for trunk stretching, simple core.    Time 3   Period Weeks   Status Achieved     PT SHORT TERM GOAL #2   Title Pt will report less pain with sitting longer periods (no more than 5/10)    Baseline no back pain   Time 3   Period Weeks   Status Achieved           PT Long Term Goals - Apr 26, 2016 0912      PT LONG TERM GOAL #1   Title Pt will be I with more advanced HEP and concepts of lifting, body mechanics for safety at work.    Baseline able   Time 6   Period Weeks   Status Achieved     PT LONG TERM GOAL #2   Title Pt will sit, stand as needed without increase in back/LE symptoms for work and recreational activities.    Time 6   Period Weeks   Status Achieved     PT LONG TERM GOAL #3   Title Pt wil report no numbness in Rt.  LE >80% of the time.    Time 6   Period Weeks   Status Achieved     PT LONG TERM GOAL #4   Title Pt will be able to lift items up to 50 lbs with good form, no increase in back pain.    Baseline lifts the 8 pounds needed for work correctly,  Does not  need to lift 50 #.   Time 6   Period Weeks   Status Not Met               Plan - 04/26/16 0909    Clinical Impression Statement Patient is ready for D/C.  All goals met except lifting goal.  He does not lift that much at work.  1 board weight about 8 pounds.  Oswestry given but patient did not follow directions and checked 2 boxes in several sections.   He has had no pain in over a week.    PT Next Visit Plan Discharge   PT Home Exercise Plan continue   Consulted and Agree with Plan of Care Patient      Patient will benefit from skilled therapeutic intervention in order to improve the following deficits and impairments:  Decreased mobility, Decreased strength, Impaired sensation, Postural dysfunction, Improper body mechanics, Hypomobility, Pain, Increased fascial restricitons  Visit Diagnosis: Radiculopathy, lumbar region  Muscle weakness (generalized)       G-Codes - 04-26-2016 1443    Functional Assessment Tool Used clinical judgement    Functional Limitation Changing and maintaining body position   Changing and Maintaining Body Position Current Status (Z1696) 0 percent impaired, limited or restricted   Changing and Maintaining Body Position Goal Status (V8938) At least 20 percent but less than 40 percent impaired, limited or restricted   Changing and Maintaining Body Position Discharge Status (B0175) 0 percent impaired, limited or restricted      Problem List Patient Active Problem List   Diagnosis Date Noted  . Degenerative joint disease (DJD) of lumbar spine 03/22/2016  . Constipation 03/10/2016  . Lumbar radiculopathy, acute 02/20/2016  . Encounter for immunization 11/26/2015  . Insomnia 06/10/2015  . External hemorrhoid 03/07/2015  . Health care maintenance 01/09/2014  . Carpal tunnel syndrome of left wrist 01/03/2013  . Dyslipidemia 11/09/2012  . Vitamin D deficiency 11/09/2012  . Essential hypertension 11/06/2012  .  Tobacco abuse 11/06/2012  . Seasonal  allergies 11/06/2012  . Erectile dysfunction 11/06/2012  . GERD (gastroesophageal reflux disease) 11/06/2012  . DDD (degenerative disc disease), thoracolumbar 11/06/2012  . Lactose intolerance 11/06/2012  . Diverticulosis of colon without hemorrhage 11/06/2012    PAA,JENNIFER PTA 04/08/2016, 2:44 PM  [facility] [address] [place], [place], [address] Phone: [phone]   Fax:  [phone]  Name: John Keith MRN: [id number] Date of Birth: Jan 31, 1951  PHYSICAL THERAPY DISCHARGE SUMMARY  Visits from Start of Care: 8  Current functional level related to goals / functional outcomes: Pt doing well, has not had pain in >1 week.  I with HEP.See goals.    Remaining deficits: Posture, tight hips, min core weakness but none affecting function.    Education / Equipment: HEP, posture and lifting  Plan: Patient agrees to discharge.  Patient goals were met. Patient is being discharged due to meeting the stated rehab goals.     Raeford Razor, PT 04/08/16 2:43 PM Phone: [phone] Fax: [phone] ?????

## 2016-05-13 ENCOUNTER — Other Ambulatory Visit: Payer: Self-pay | Admitting: *Deleted

## 2016-05-13 DIAGNOSIS — M5416 Radiculopathy, lumbar region: Secondary | ICD-10-CM

## 2016-05-13 MED ORDER — DICLOFENAC SODIUM 1 % TD GEL: 4.0000 g | Freq: Four times a day (QID) | TRANSDERMAL | 1 refills | Status: DC

## 2016-05-13 NOTE — Telephone Encounter (Signed)
Called [facility] - stated rx was refilled on 9/7,9/12,9/18, and 9/23 - so pt does not have any refills left. I called pt but no answer.

## 2016-06-02 ENCOUNTER — Telehealth: Payer: Self-pay | Admitting: Internal Medicine

## 2016-06-02 NOTE — Telephone Encounter (Signed)
APT. REMINDER CALL, LMTCB °

## 2016-06-03 ENCOUNTER — Ambulatory Visit (INDEPENDENT_AMBULATORY_CARE_PROVIDER_SITE_OTHER): Payer: BLUE CROSS/BLUE SHIELD | Admitting: Internal Medicine

## 2016-06-03 ENCOUNTER — Encounter: Payer: Self-pay | Admitting: Internal Medicine

## 2016-06-03 VITALS — BP 156/97 | HR 72 | Temp 97.8°F | Ht >= 80 in | Wt 227.3 lb

## 2016-06-03 DIAGNOSIS — R5383 Other fatigue: Secondary | ICD-10-CM

## 2016-06-03 DIAGNOSIS — Z0000 Encounter for general adult medical examination without abnormal findings: Secondary | ICD-10-CM

## 2016-06-03 DIAGNOSIS — N401 Enlarged prostate with lower urinary tract symptoms: Secondary | ICD-10-CM

## 2016-06-03 DIAGNOSIS — E785 Hyperlipidemia, unspecified: Secondary | ICD-10-CM | POA: Diagnosis not present

## 2016-06-03 DIAGNOSIS — R351 Nocturia: Secondary | ICD-10-CM

## 2016-06-03 DIAGNOSIS — R399 Unspecified symptoms and signs involving the genitourinary system: Secondary | ICD-10-CM | POA: Insufficient documentation

## 2016-06-03 DIAGNOSIS — I10 Essential (primary) hypertension: Secondary | ICD-10-CM | POA: Diagnosis not present

## 2016-06-03 DIAGNOSIS — Z9189 Other specified personal risk factors, not elsewhere classified: Secondary | ICD-10-CM

## 2016-06-03 DIAGNOSIS — R339 Retention of urine, unspecified: Secondary | ICD-10-CM

## 2016-06-03 DIAGNOSIS — Z23 Encounter for immunization: Secondary | ICD-10-CM

## 2016-06-03 DIAGNOSIS — Z1159 Encounter for screening for other viral diseases: Secondary | ICD-10-CM

## 2016-06-03 DIAGNOSIS — F17210 Nicotine dependence, cigarettes, uncomplicated: Secondary | ICD-10-CM

## 2016-06-03 DIAGNOSIS — Z79899 Other long term (current) drug therapy: Secondary | ICD-10-CM

## 2016-06-03 DIAGNOSIS — E559 Vitamin D deficiency, unspecified: Secondary | ICD-10-CM | POA: Diagnosis not present

## 2016-06-03 HISTORY — DX: Other fatigue: R53.83

## 2016-06-03 LAB — HM HEPATITIS C SCREENING LAB: HM HEPATITIS C SCREENING: NEGATIVE

## 2016-06-03 MED ORDER — TAMSULOSIN HCL 0.4 MG PO CAPS: 0.4000 mg | ORAL_CAPSULE | Freq: Every day | ORAL | 0 refills | Status: DC

## 2016-06-03 MED ORDER — VITAMIN D 1000 UNITS PO TABS: 1000.0000 [IU] | ORAL_TABLET | Freq: Every day | ORAL | Status: DC

## 2016-06-03 MED ORDER — HYDROCHLOROTHIAZIDE 25 MG PO TABS: 25.0000 mg | ORAL_TABLET | Freq: Every day | ORAL | 3 refills | Status: DC

## 2016-06-03 NOTE — Patient Instructions (Signed)
I am starting you on a medication which should help with your urination.  It is called Flomax.  Take 1 pill a day.  Tamsulosin capsules What is this medicine? TAMSULOSIN (tam SOO loe sin) is used to treat enlargement of the prostate gland in men, a condition called benign prostatic hyperplasia or BPH. It is not for use in women. It works by relaxing muscles in the prostate and bladder neck. This improves urine flow and reduces BPH symptoms. This medicine may be used for other purposes; ask your health care provider or pharmacist if you have questions. COMMON BRAND NAME(S): Flomax What should I tell my health care provider before I take this medicine? They need to know if you have any of the following conditions: -advanced kidney disease -advanced liver disease -low blood pressure -prostate cancer -an unusual or allergic reaction to tamsulosin, sulfa drugs, other medicines, foods, dyes, or preservatives -pregnant or trying to get pregnant -breast-feeding How should I use this medicine? Take this medicine by mouth about 30 minutes after the same meal every day. Follow the directions on the prescription label. Swallow the capsules whole with a glass of water. Do not crush, chew, or open capsules. Do not take your medicine more often than directed. Do not stop taking your medicine unless your doctor tells you to. Talk to your pediatrician regarding the use of this medicine in children. Special care may be needed. Overdosage: If you think you have taken too much of this medicine contact a poison control center or emergency room at once. NOTE: This medicine is only for you. Do not share this medicine with others. What if I miss a dose? If you miss a dose, take it as soon as you can. If it is almost time for your next dose, take only that dose. Do not take double or extra doses. If you stop taking your medicine for several days or more, ask your doctor or health care professional what dose you should  start back on. What may interact with this medicine? -cimetidine -fluoxetine -ketoconazole -medicines for erectile disfunction like sildenafil, tadalafil, vardenafil -medicines for high blood pressure -other alpha-blockers like alfuzosin, doxazosin, phentolamine, phenoxybenzamine, prazosin, terazosin -warfarin This list may not describe all possible interactions. Give your health care provider a list of all the medicines, herbs, non-prescription drugs, or dietary supplements you use. Also tell them if you smoke, drink alcohol, or use illegal drugs. Some items may interact with your medicine. What should I watch for while using this medicine? Visit your doctor or health care professional for regular check ups. You will need lab work done before you start this medicine and regularly while you are taking it. Check your blood pressure as directed. Ask your health care professional what your blood pressure should be, and when you should contact him or her. This medicine may make you feel dizzy or lightheaded. This is more likely to happen after the first dose, after an increase in dose, or during hot weather or exercise. Drinking alcohol and taking some medicines can make this worse. Do not drive, use machinery, or do anything that needs mental alertness until you know how this medicine affects you. Do not sit or stand up quickly. If you begin to feel dizzy, sit down until you feel better. These effects can decrease once your body adjusts to the medicine. Contact your doctor or health care professional right away if you have an erection that lasts longer than 4 hours or if it becomes painful. This  may be a sign of a serious problem and must be treated right away to prevent permanent damage. If you are thinking of having cataract surgery, tell your eye surgeon that you have taken this medicine. What side effects may I notice from receiving this medicine? Side effects that you should report to your doctor  or health care professional as soon as possible: -allergic reactions like skin rash or itching, hives, swelling of the lips, mouth, tongue, or throat -breathing problems -change in vision -feeling faint or lightheaded -irregular heartbeat -prolonged or painful erection -weakness Side effects that usually do not require medical attention (report to your doctor or health care professional if they continue or are bothersome): -back pain -change in sex drive or performance -constipation, nausea or vomiting -cough -drowsy -runny or stuffy nose -trouble sleeping This list may not describe all possible side effects. Call your doctor for medical advice about side effects. You may report side effects to FDA at 1-800-FDA-1088. Where should I keep my medicine? Keep out of the reach of children. Store at room temperature between 15 and 30 degrees C (59 and 86 degrees F). Throw away any unused medicine after the expiration date. NOTE: This sheet is a summary. It may not cover all possible information. If you have questions about this medicine, talk to your doctor, pharmacist, or health care provider.  2017 Elsevier/Gold Standard (2012-06-21 14:11:34)

## 2016-06-03 NOTE — Progress Notes (Signed)
[facility] Subjective:  HPI: John Keith is a 65 y.o. male who presents for follow up of HTN.  Please see problem based charting below for the status of his chronic medical problems.     Review of Systems: Review of Systems  Constitutional: Negative for chills, fever and malaise/fatigue.  Respiratory: Negative for cough and shortness of breath.   Cardiovascular: Negative for chest pain.  Gastrointestinal: Negative for abdominal pain.  Genitourinary: Positive for frequency. Negative for dysuria and urgency.    Objective:  Physical Exam: Vitals:   06/03/16 0817  BP: (!) 156/97  Pulse: 72  Temp: 97.8 F (36.6 C)  TempSrc: Oral  SpO2: 99%  Weight: 227 lb 4.8 oz (103.1 kg)  Height: 6\' 9"  (2.057 m)  Physical Exam  Constitutional: He is well-developed, well-nourished, and in no distress.  Cardiovascular: Normal rate and regular rhythm.   Pulmonary/Chest: Effort normal and breath sounds normal.  Musculoskeletal: He exhibits no edema.  Nursing note and vitals reviewed.  Assessment & Plan:  Essential hypertension HPI: Did not take BP meds this morning.  He did have what sounds like an episdoe of orthostatic hypotension on standing last week when he was also having some diarreha, this has resolved.  A: Essential HTN, not at goal  P: -Continue HCTZ given previous control with 25mg  daily  Dyslipidemia HPI: He has not been regularly taking atorvastatin, not sure if he picked it up at the drug store  A: Dyslipidemia with elevated 10 year ASCVD  P: Instructed to start taking Atorvastatin 10mg  daily, recheck Lipid panel at next visit.  Vitamin D deficiency HPI: Was found to have low vitamin D level back in May, at one point was taking a MVI but no longer takes.  Otherwise asymptomatic  A: Vit D deficiency  P; Instructed to start Vit D 1000 IU daily, rehcheck Vitamin D at next visit once adherent   Health care maintenance -Prevnar 13  today  Fatigue HPI: Reports some minor fatigue, this may be related to his poor sleep habits.  A: Other fatigue  P: Will check a CBC and address LUTS  Lower urinary tract symptoms (LUTS) HPI: Patient reports difficulty sleeping, followed by daytime sleepiness.  He attributes this to waking up 2-3 times a night to urinate.  He denies dribbling, but does feel that he does not always completely empty his bladder. I preformed the AUA BPH symptom score he he actually scored a 14 placing him in the moderate symoptom score.   A: LUTS  P: Moderate symptom score, I dicsussed the nature of BPH and prostate cancer, discussed screening with DRE and PSA, along with some of the problems with PSA testing.  He however elected for the PSA screen therefore I will delay the DRE to next visit.  His PSA has returned reassuring low.  I will start Flomax.  I discussed obtaining a U/A with the patient however he failed to leave a urine specimen therefore I will obtain that at the next visit.   Medications Ordered Meds ordered this encounter  Medications  . cholecalciferol (VITAMIN D) 1000 units tablet    Sig: Take 1 tablet (1,000 Units total) by mouth daily.  . tamsulosin (FLOMAX) 0.4 MG CAPS capsule    Sig: Take 1 capsule (0.4 mg total) by mouth daily.    Dispense:  90 capsule    Refill:  0  . hydrochlorothiazide (HYDRODIURIL) 25 MG tablet    Sig: Take 1 tablet (25 mg  total) by mouth daily.    Dispense:  90 tablet    Refill:  3   Other Orders Orders Placed This Encounter  Procedures  . Pneumococcal conjugate vaccine 13-valent  . PSA  . CMP14 + Anion Gap  . CBC no Diff  . Hepatitis C antibody   Follow Up: Return in about 3 months (around 09/01/2016).

## 2016-06-04 LAB — PSA: Prostate Specific Ag, Serum: 1.9 ng/mL (ref 0.0–4.0)

## 2016-06-04 LAB — CBC
HEMOGLOBIN: 13.3 g/dL (ref 12.6–17.7)
Hematocrit: 40.2 % (ref 37.5–51.0)
MCH: 31.1 pg (ref 26.6–33.0)
MCHC: 33.1 g/dL (ref 31.5–35.7)
MCV: 94 fL (ref 79–97)
PLATELETS: 288 10*3/uL (ref 150–379)
RBC: 4.27 x10E6/uL (ref 4.14–5.80)
RDW: 13 % (ref 12.3–15.4)
WBC: 5.5 10*3/uL (ref 3.4–10.8)

## 2016-06-04 LAB — CMP14 + ANION GAP
ALT: 33 IU/L (ref 0–44)
ANION GAP: 17 mmol/L (ref 10.0–18.0)
AST: 35 IU/L (ref 0–40)
Albumin/Globulin Ratio: 1.7 (ref 1.2–2.2)
Albumin: 4.3 g/dL (ref 3.6–4.8)
Alkaline Phosphatase: 80 IU/L (ref 39–117)
BUN/Creatinine Ratio: 14 (ref 10–24)
BUN: 14 mg/dL (ref 8–27)
Bilirubin Total: 0.2 mg/dL (ref 0.0–1.2)
CALCIUM: 9.4 mg/dL (ref 8.6–10.2)
CO2: 25 mmol/L (ref 18–29)
CREATININE: 1.01 mg/dL (ref 0.76–1.27)
Chloride: 101 mmol/L (ref 96–106)
GFR calc Af Amer: 89 mL/min/{1.73_m2} (ref >59)
GFR, EST NON AFRICAN AMERICAN: 77 mL/min/{1.73_m2} (ref >59)
GLOBULIN, TOTAL: 2.6 g/dL (ref 1.5–4.5)
Glucose: 77 mg/dL (ref 65–99)
Potassium: 3.9 mmol/L (ref 3.5–5.2)
SODIUM: 143 mmol/L (ref 134–144)
Total Protein: 6.9 g/dL (ref 6.0–8.5)

## 2016-06-04 LAB — HEPATITIS C ANTIBODY: Hep C Virus Ab: <0.1 s/co ratio (ref 0.0–0.9)

## 2016-06-10 NOTE — Assessment & Plan Note (Signed)
Prevnar 13 today

## 2016-06-10 NOTE — Assessment & Plan Note (Addendum)
HPI: Patient reports difficulty sleeping, followed by daytime sleepiness.  He attributes this to waking up 2-3 times a night to urinate.  He denies dribbling, but does feel that he does not always completely empty his bladder. I preformed the AUA BPH symptom score he he actually scored a 14 placing him in the moderate symoptom score.   A: LUTS  P: Moderate symptom score, I dicsussed the nature of BPH and prostate cancer, discussed screening with DRE and PSA, along with some of the problems with PSA testing.  He however elected for the PSA screen therefore I will delay the DRE to next visit.  His PSA has returned reassuring low.  I will start Flomax.  I discussed obtaining a U/A with the patient however he failed to leave a urine specimen therefore I will obtain that at the next visit.

## 2016-06-10 NOTE — Assessment & Plan Note (Signed)
HPI: Reports some minor fatigue, this may be related to his poor sleep habits.  A: Other fatigue  P: Will check a CBC and address LUTS

## 2016-06-10 NOTE — Assessment & Plan Note (Signed)
HPI: Was found to have low vitamin D level back in May, at one point was taking a MVI but no longer takes.  Otherwise asymptomatic  A: Vit D deficiency  P; Instructed to start Vit D 1000 IU daily, rehcheck Vitamin D at next visit once adherent

## 2016-06-10 NOTE — Assessment & Plan Note (Signed)
HPI: Did not take BP meds this morning.  He did have what sounds like an episdoe of orthostatic hypotension on standing last week when he was also having some diarreha, this has resolved.  A: Essential HTN, not at goal  P: -Continue HCTZ given previous control with 25mg  daily

## 2016-06-10 NOTE — Assessment & Plan Note (Signed)
HPI: He has not been regularly taking atorvastatin, not sure if he picked it up at the drug store  A: Dyslipidemia with elevated 10 year ASCVD  P: Instructed to start taking Atorvastatin 10mg  daily, recheck Lipid panel at next visit.

## 2016-08-24 ENCOUNTER — Encounter: Payer: Self-pay | Admitting: *Deleted

## 2016-09-02 ENCOUNTER — Ambulatory Visit: Payer: BLUE CROSS/BLUE SHIELD | Admitting: Internal Medicine

## 2016-09-16 ENCOUNTER — Other Ambulatory Visit: Payer: Self-pay

## 2016-09-16 DIAGNOSIS — M5416 Radiculopathy, lumbar region: Secondary | ICD-10-CM

## 2016-09-17 MED ORDER — DICLOFENAC SODIUM 1 % TD GEL: 4.0000 g | Freq: Four times a day (QID) | TRANSDERMAL | 1 refills | Status: DC

## 2016-09-22 ENCOUNTER — Telehealth: Payer: Self-pay | Admitting: Internal Medicine

## 2016-09-22 DIAGNOSIS — I700 Atherosclerosis of aorta: Secondary | ICD-10-CM | POA: Insufficient documentation

## 2016-09-22 NOTE — Telephone Encounter (Signed)
APT. REMINDER CALL, LMTCB °

## 2016-09-22 NOTE — Progress Notes (Deleted)
[facility] Subjective:  HPI: [name] is a 66 y.o. male who presents for follow up of HTN and chronic low back pain.       Review of Systems: *** Objective:  Physical Exam: There were no vitals filed for this visit. Physical Exam  Assessment & Plan:  No problem-specific Assessment & Plan notes found for this encounter.   Medications Ordered No orders of the defined types were placed in this encounter.  Other Orders No orders of the defined types were placed in this encounter.  Follow Up: No Follow-up on file.

## 2016-09-23 ENCOUNTER — Encounter: Payer: Self-pay | Admitting: Internal Medicine

## 2016-09-23 ENCOUNTER — Ambulatory Visit: Payer: BLUE CROSS/BLUE SHIELD | Admitting: Internal Medicine

## 2016-10-13 ENCOUNTER — Telehealth: Payer: Self-pay | Admitting: Internal Medicine

## 2016-10-13 NOTE — Telephone Encounter (Signed)
Calling to confirm appointment for 10/14/16 at 8:45 no answer

## 2016-10-14 ENCOUNTER — Encounter: Payer: Self-pay | Admitting: Internal Medicine

## 2016-10-14 ENCOUNTER — Ambulatory Visit (INDEPENDENT_AMBULATORY_CARE_PROVIDER_SITE_OTHER): Payer: BLUE CROSS/BLUE SHIELD | Admitting: Internal Medicine

## 2016-10-14 VITALS — BP 127/90 | HR 70 | Temp 98.5°F | Ht >= 80 in | Wt 223.3 lb

## 2016-10-14 DIAGNOSIS — F17210 Nicotine dependence, cigarettes, uncomplicated: Secondary | ICD-10-CM

## 2016-10-14 DIAGNOSIS — E559 Vitamin D deficiency, unspecified: Secondary | ICD-10-CM

## 2016-10-14 DIAGNOSIS — E785 Hyperlipidemia, unspecified: Secondary | ICD-10-CM

## 2016-10-14 DIAGNOSIS — I10 Essential (primary) hypertension: Secondary | ICD-10-CM | POA: Diagnosis not present

## 2016-10-14 MED ORDER — BENAZEPRIL-HYDROCHLOROTHIAZIDE 10-12.5 MG PO TABS
1.0000 | ORAL_TABLET | Freq: Every day | ORAL | 1 refills | Status: DC
Start: 2016-10-14 — End: 2018-06-29

## 2016-10-14 MED ORDER — ROSUVASTATIN CALCIUM 40 MG PO TABS: 40.0000 mg | ORAL_TABLET | Freq: Every day | ORAL | 1 refills | Status: DC

## 2016-10-14 NOTE — Assessment & Plan Note (Signed)
Reports he has been taking the Vitamin D since his visit in 05/2016. Denies any symptoms today.  Assessment: Vit D deficiency  Plan: Will re-check level at next visit with his lab draw

## 2016-10-14 NOTE — Assessment & Plan Note (Signed)
BP Readings from Last 3 Encounters:  10/14/16 127/90  06/03/16 (!) 156/97  03/22/16 138/83    Lab Results  Component Value Date   NA 143 06/03/2016   K 3.9 06/03/2016   CREATININE 1.01 06/03/2016   John Keith presents today with concerns about his blood pressure. Feels that he doesn't have good control of his blood pressure. Reports he has headaches and feeling drowsy like he is going to pass out. Has never lost consciousness. Reports that when he gets up in the morning and is walking around he feels groggy. No vision changes, chest pain or shortness of breath. Denies any dizziness or lightheadedness with standing. Does not check his BP at home. Did not take his BP medication this morning.   BP on arrival was 142/90. On re-check it was 127/90.  Assessment: HTN  Plan: Will start benazepril-hctz 10-12.5 mg daily today in an effort to ensure compliance and decrease pill burden.  RTC in 2 weeks for follow up and BMET.

## 2016-10-14 NOTE — Patient Instructions (Addendum)
John Keith,  It was a pleasure meeting you today. Stop taking the HCTZ and start taking the new medication I sent in Benalapril-HCTZ combination medication. I would also like you to start taking the cholesterol medication I sent in called Rosuvastatin.   Please follow up with me in 2 weeks for BP check.

## 2016-10-14 NOTE — Progress Notes (Signed)
   CC: BP follow up  HPI:  [name] is a 66 y.o. male with a past medical history listed below here today for follow up of his HTN.   For details of today's visit and the status of his chronic medical issues please refer to the assessment and plan.   Past Medical History:  Diagnosis Date  . Allergy   . Arthritis   . Carpal tunnel syndrome on left   . DDD (degenerative disc disease), thoracolumbar    unknown location   . DDD (degenerative disc disease), thoracolumbar    multilevel  . Degenerative joint disease of left shoulder   . Degenerative joint disease of left shoulder    02/2011   . Diverticulosis    left colon (2008)   . ED (erectile dysfunction)    02/2011   . GERD (gastroesophageal reflux disease)   . H. pylori infection   . Hyperlipidemia   . Hypertension   . Lactose intolerance   . Smoking    smoking since age 19 y.o  . Vitamin D deficiency     Review of Systems:   See HPI  Physical Exam:  Vitals:   10/14/16 0844  BP: (!) 142/90  Pulse: 72  Temp: 98.5 F (36.9 C)  TempSrc: Oral  SpO2: 100%  Weight: 223 lb 4.8 oz (101.3 kg)  Height: 6\' 9"  (2.057 m)   Physical Exam  Constitutional: He is well-developed, well-nourished, and in no distress.  Cardiovascular: Normal rate and regular rhythm.   Pulmonary/Chest: Effort normal and breath sounds normal.     Assessment & Plan:   See Encounters Tab for problem based charting.  Patient discussed with Dr. Heide Spark

## 2016-10-14 NOTE — Assessment & Plan Note (Signed)
Atorvastatin 10 mg started last visit in 05/2016. Reports taking them for a week but stopped it because he was feeling disorientated. No myalgias. Symptoms resolved when he stopped the medication. ASCVD score 15%.  Assessment: Dyslipidemia  Plan: Will try Rosuvastatin 40 mg daily to see if he can tolerate more hydrophilic statin. If still unable to tolerate can consider trying Ezetimibe.

## 2016-10-19 NOTE — Progress Notes (Signed)
Internal Medicine Clinic Attending  Case discussed with Dr. Boswell at the time of the visit.  We reviewed the resident's history and exam and pertinent patient test results.  I agree with the assessment, diagnosis, and plan of care documented in the resident's note.  

## 2016-10-29 ENCOUNTER — Telehealth: Payer: Self-pay | Admitting: Internal Medicine

## 2016-10-29 NOTE — Telephone Encounter (Signed)
APT. REMINDER CALL, LMTCB °

## 2016-11-01 ENCOUNTER — Ambulatory Visit (INDEPENDENT_AMBULATORY_CARE_PROVIDER_SITE_OTHER): Payer: BLUE CROSS/BLUE SHIELD | Admitting: Internal Medicine

## 2016-11-01 ENCOUNTER — Encounter: Payer: Self-pay | Admitting: Internal Medicine

## 2016-11-01 VITALS — BP 138/83 | HR 66 | Temp 98.1°F | Ht >= 80 in | Wt 222.6 lb

## 2016-11-01 DIAGNOSIS — J029 Acute pharyngitis, unspecified: Secondary | ICD-10-CM | POA: Diagnosis not present

## 2016-11-01 DIAGNOSIS — E559 Vitamin D deficiency, unspecified: Secondary | ICD-10-CM | POA: Diagnosis not present

## 2016-11-01 DIAGNOSIS — F17210 Nicotine dependence, cigarettes, uncomplicated: Secondary | ICD-10-CM

## 2016-11-01 DIAGNOSIS — H578 Other specified disorders of eye and adnexa: Secondary | ICD-10-CM

## 2016-11-01 DIAGNOSIS — I10 Essential (primary) hypertension: Secondary | ICD-10-CM

## 2016-11-01 DIAGNOSIS — J301 Allergic rhinitis due to pollen: Secondary | ICD-10-CM

## 2016-11-01 DIAGNOSIS — J3489 Other specified disorders of nose and nasal sinuses: Secondary | ICD-10-CM

## 2016-11-01 MED ORDER — CETIRIZINE HCL 10 MG PO TABS: 10.0000 mg | ORAL_TABLET | Freq: Every day | ORAL | 1 refills | Status: DC

## 2016-11-01 MED ORDER — FLUTICASONE PROPIONATE 50 MCG/ACT NA SUSP: 1.0000 | Freq: Every day | NASAL | 2 refills | Status: DC

## 2016-11-01 NOTE — Assessment & Plan Note (Signed)
Symptoms seem most consistent with seasonal allergies. Will have patient start cetirizine 10 mg daily. He was also advised to use Flonase which is on his medication list.

## 2016-11-01 NOTE — Patient Instructions (Signed)
General Instructions: - Blood pressure is great on Benazepril-HCTZ. Continue this medicine. - Will check labs today. Continue vitamin D supplementation for now. - Try taking Cetirizine 10 mg daily for allergies. Can also try Flonase 1 spray each nostril daily to help with congestion.   Please bring your medicines with you each time you come to clinic.  Medicines may include prescription medications, over-the-counter medications, herbal remedies, eye drops, vitamins, or other pills.   Progress Toward Treatment Goals:  Treatment Goal 03/07/2015  Blood pressure at goal  Stop smoking smoking the same amount    Self Care Goals & Plans:  Self Care Goal 06/03/2016  Manage my medications take my medicines as prescribed; bring my medications to every visit; refill my medications on time  Monitor my health keep track of my blood pressure  Eat healthy foods eat foods that are low in salt; eat more vegetables; eat baked foods instead of fried foods  Be physically active find an activity I enjoy  Stop smoking cut down the number of cigarettes smoked  Meeting treatment goals -    No flowsheet data found.   Care Management & Community Referrals:  Referral 03/07/2015  Referrals made for care management support none needed  Referrals made to community resources -

## 2016-11-01 NOTE — Progress Notes (Signed)
   CC: Hypertension follow-up  HPI:  [name] Keith is a 66 y.o. man with past medical history as noted below who presents today for follow-up of his hypertension.  HTN: BP today is 138/83. He is tolerating Benazepril-HCTZ 10-12.5 mg daily.  Vitamin D deficiency: Patient has a history of vitamin D deficiency with his most current level in May 2017 at 24.2. He has been taking cholecalciferol 1000 mg daily. He denies any falls. He does get outside daily for his morning walk.  Allergic rhinitis: Patient reports symptoms of rhinorrhea and postnasal drip that have been ongoing for the last few weeks. He notes associated sore throat, watery eyes, and sneezing. He has tried a nasal spray (he cannot recall the name) which has not provided any relief.  Past Medical History:  Diagnosis Date  . Allergy   . Arthritis   . Carpal tunnel syndrome on left   . DDD (degenerative disc disease), thoracolumbar    unknown location   . DDD (degenerative disc disease), thoracolumbar    multilevel  . Degenerative joint disease of left shoulder   . Degenerative joint disease of left shoulder    02/2011   . Diverticulosis    left colon (2008)   . ED (erectile dysfunction)    02/2011   . GERD (gastroesophageal reflux disease)   . H. pylori infection   . Hyperlipidemia   . Hypertension   . Lactose intolerance   . Smoking    smoking since age 28 y.o  . Vitamin D deficiency     Review of Systems:   All negative except per history of present illness  Physical Exam:  Vitals:   11/01/16 0841  BP: 138/83  Pulse: 66  Temp: 98.1 F (36.7 C)  TempSrc: Oral  SpO2: 97%  Weight: 222 lb 9.6 oz (101 kg)  Height: 6\' 9"  (2.057 m)   Gen.: Tall well-nourished man in no acute distress HEENT: EOMI, sclera anicteric, posterior pharynx nonerythematous, no tenderness to maxillary sinuses, mucous membranes moist CV: RRR, no M/G/R Pulm: CTA bilaterally, breaths nonlabored  Assessment & Plan:   See  Encounters Tab for problem based charting.  Patient discussed with Dr. Rogelia Boga

## 2016-11-01 NOTE — Assessment & Plan Note (Signed)
History of vitamin D deficiency. His last level was low at 24.2 last year. Will check a repeat level today. Advised patient to continue cholecalciferol 1000 mg daily for now.

## 2016-11-01 NOTE — Progress Notes (Signed)
Internal Medicine Clinic Attending  Case discussed with Dr. Rivet at the time of the visit.  We reviewed the resident's history and exam and pertinent patient test results.  I agree with the assessment, diagnosis, and plan of care documented in the resident's note.  

## 2016-11-01 NOTE — Assessment & Plan Note (Signed)
BP acceptable. Will have him continue Benazepril-HCTZ 10-12.5 mg daily. Will obtain bmet today.

## 2016-11-02 LAB — BMP8+ANION GAP
ANION GAP: 17 mmol/L (ref 10.0–18.0)
BUN/Creatinine Ratio: 16 (ref 10–24)
BUN: 15 mg/dL (ref 8–27)
CALCIUM: 9.3 mg/dL (ref 8.6–10.2)
CHLORIDE: 99 mmol/L (ref 96–106)
CO2: 24 mmol/L (ref 18–29)
Creatinine, Ser: 0.93 mg/dL (ref 0.76–1.27)
GFR calc Af Amer: 98 mL/min/{1.73_m2} (ref >59)
GFR, EST NON AFRICAN AMERICAN: 85 mL/min/{1.73_m2} (ref >59)
Glucose: 107 mg/dL — ABNORMAL HIGH (ref 65–99)
Potassium: 3.8 mmol/L (ref 3.5–5.2)
SODIUM: 140 mmol/L (ref 134–144)

## 2016-11-02 LAB — VITAMIN D 25 HYDROXY (VIT D DEFICIENCY, FRACTURES): VIT D 25 HYDROXY: 36.1 ng/mL (ref 30.0–100.0)

## 2016-11-26 ENCOUNTER — Encounter: Payer: BLUE CROSS/BLUE SHIELD | Admitting: Internal Medicine

## 2016-12-17 LAB — HM COLONOSCOPY

## 2017-02-17 ENCOUNTER — Encounter: Payer: BLUE CROSS/BLUE SHIELD | Admitting: Internal Medicine

## 2017-03-22 ENCOUNTER — Other Ambulatory Visit: Payer: Self-pay | Admitting: Internal Medicine

## 2017-03-22 DIAGNOSIS — R9389 Abnormal findings on diagnostic imaging of other specified body structures: Secondary | ICD-10-CM

## 2017-03-22 DIAGNOSIS — R160 Hepatomegaly, not elsewhere classified: Secondary | ICD-10-CM

## 2017-03-24 ENCOUNTER — Ambulatory Visit: Payer: BLUE CROSS/BLUE SHIELD

## 2017-03-28 ENCOUNTER — Ambulatory Visit
Admission: RE | Admit: 2017-03-28 | Discharge: 2017-03-28 | Disposition: A | Discharge status: Home / Self Care | Payer: BLUE CROSS/BLUE SHIELD | Source: Ambulatory Visit | Attending: Internal Medicine | Admitting: Internal Medicine

## 2017-03-28 DIAGNOSIS — I77811 Abdominal aortic ectasia: Secondary | ICD-10-CM | POA: Insufficient documentation

## 2017-03-28 DIAGNOSIS — R160 Hepatomegaly, not elsewhere classified: Secondary | ICD-10-CM

## 2017-03-28 DIAGNOSIS — K769 Liver disease, unspecified: Secondary | ICD-10-CM | POA: Diagnosis present

## 2017-03-28 DIAGNOSIS — R9389 Abnormal findings on diagnostic imaging of other specified body structures: Secondary | ICD-10-CM

## 2017-03-28 DIAGNOSIS — K7689 Other specified diseases of liver: Secondary | ICD-10-CM | POA: Diagnosis not present

## 2017-07-21 ENCOUNTER — Other Ambulatory Visit: Payer: Self-pay

## 2017-08-09 ENCOUNTER — Ambulatory Visit: Payer: Self-pay | Admitting: Urology

## 2017-08-15 ENCOUNTER — Ambulatory Visit (INDEPENDENT_AMBULATORY_CARE_PROVIDER_SITE_OTHER): Payer: BLUE CROSS/BLUE SHIELD | Admitting: Urology

## 2017-08-15 ENCOUNTER — Encounter: Payer: Self-pay | Admitting: Urology

## 2017-08-15 VITALS — BP 132/69 | HR 80 | Ht >= 80 in | Wt 223.0 lb

## 2017-08-15 DIAGNOSIS — N400 Enlarged prostate without lower urinary tract symptoms: Secondary | ICD-10-CM

## 2017-08-15 DIAGNOSIS — N529 Male erectile dysfunction, unspecified: Secondary | ICD-10-CM

## 2017-08-15 LAB — URINALYSIS, COMPLETE
BILIRUBIN UA: NEGATIVE
Glucose, UA: NEGATIVE
LEUKOCYTES UA: NEGATIVE
NITRITE UA: NEGATIVE
PH UA: 5.5 (ref 5.0–7.5)
RBC UA: NEGATIVE
SPEC GRAV UA: 1.025 (ref 1.005–1.030)
Urobilinogen, Ur: 0.2 mg/dL (ref 0.2–1.0)

## 2017-08-15 LAB — MICROSCOPIC EXAMINATION
EPITHELIAL CELLS (NON RENAL): NONE SEEN /HPF (ref 0–10)
RBC MICROSCOPIC, UA: NONE SEEN /HPF (ref 0–?)
WBC, UA: NONE SEEN /hpf (ref 0–?)

## 2017-08-15 LAB — BLADDER SCAN AMB NON-IMAGING

## 2017-08-15 MED ORDER — SILDENAFIL CITRATE 20 MG PO TABS: ORAL_TABLET | ORAL | 3 refills | Status: DC

## 2017-08-15 NOTE — Progress Notes (Signed)
08/15/2017 10:18 AM   John Keith 1951-05-05 [id number]  Referring provider: Gust Rung, DO [address]  [place], [place] [address]  Chief Complaint  Patient presents with  . Benign Prostatic Hypertrophy    New Patient    HPI: John Keith is a 67 year old male seen at the request of Dr. Welton Flakes for evaluation of prostate enlargement.  He complains of mild to moderate lower urinary tract symptoms of occasional urinary hesitancy, decreased force and caliber of his urinary stream and urinary frequency.  He has been on tamsulosin in the past however is presently not taking.  He states his voiding symptoms are not bothersome.  He states the main reason he came today was because of erectile dysfunction.  He does have difficulty achieving and maintaining an erection.  Risk factors include hypertension and antihypertensive medications.  He has a prior history of tobacco use and smoked over 20 years.  He had a CT scan of the abdomen pelvis and it was noted his prostate was enlarged.   PMH: Past Medical History:  Diagnosis Date  . Allergy   . Arthritis   . Carpal tunnel syndrome on left   . DDD (degenerative disc disease), thoracolumbar    unknown location   . DDD (degenerative disc disease), thoracolumbar    multilevel  . Degenerative joint disease of left shoulder   . Degenerative joint disease of left shoulder    02/2011   . Diverticulosis    left colon (2008)   . ED (erectile dysfunction)    02/2011   . GERD (gastroesophageal reflux disease)   . H. pylori infection   . Hyperlipidemia   . Hypertension   . Lactose intolerance   . Smoking    smoking since age 26 y.o  . Vitamin D deficiency     Surgical History: Past Surgical History:  Procedure Laterality Date  . APPENDECTOMY    . COLONOSCOPY     2008 diverticulosis  . ESOPHAGOGASTRODUODENOSCOPY     h/o +h. pylori  . OTHER SURGICAL HISTORY     heart catherization 1999 normal   . SPINE SURGERY     x 2      Home Medications:  Allergies as of 08/15/2017      Reactions   Tomato Itching      Medication List        Accurate as of 08/15/17 10:18 AM. Always use your most recent med list.          aspirin EC 81 MG tablet Take 81 mg by mouth daily.   benazepril-hydrochlorthiazide 10-12.5 MG tablet Commonly known as:  LOTENSIN HCT Take 1 tablet by mouth daily.       Allergies:  Allergies  Allergen Reactions  . Tomato Itching    Family History: Family History  Problem Relation Age of Onset  . Throat cancer Father 34  . Cancer Maternal Uncle        uncle ?maternal or paternal died colon cancer in his 55s     Social History:  reports that he has been smoking.  He started smoking about 46 years ago. He has been smoking about 0.50 packs per day. he has never used smokeless tobacco. He reports that he does not drink alcohol or use drugs.  ROS: UROLOGY Frequent Urination?: Yes Hard to postpone urination?: Yes Burning/pain with urination?: No Get up at night to urinate?: Yes Leakage of urine?: No Urine stream starts and stops?: Yes Trouble starting stream?: Yes Do  you have to strain to urinate?: No Blood in urine?: No Urinary tract infection?: No Sexually transmitted disease?: No Injury to kidneys or bladder?: No Painful intercourse?: No Weak stream?: Yes Erection problems?: Yes Penile pain?: No  Gastrointestinal Nausea?: No Vomiting?: No Indigestion/heartburn?: Yes Diarrhea?: Yes Constipation?: Yes  Constitutional Fever: Yes Night sweats?: Yes Weight loss?: No Fatigue?: Yes  Skin Skin rash/lesions?: No Itching?: Yes  Eyes Blurred vision?: No Double vision?: No  Ears/Nose/Throat Sore throat?: No Sinus problems?: Yes  Hematologic/Lymphatic Swollen glands?: No Easy bruising?: No  Cardiovascular Leg swelling?: No Chest pain?: Yes  Respiratory Cough?: No Shortness of breath?: No  Endocrine Excessive thirst?: No  Musculoskeletal Back  pain?: Yes Joint pain?: Yes  Neurological Headaches?: Yes Dizziness?: No  Psychologic Depression?: No Anxiety?: No  Physical Exam: BP 132/69   Pulse 80   Ht 6\' 9"  (2.057 m)   Wt 223 lb (101.2 kg)   BMI 23.90 kg/m   Constitutional:  Alert and oriented, No acute distress. HEENT: [place] AT, moist mucus membranes.  Trachea midline, no masses. Cardiovascular: No clubbing, cyanosis, or edema. Respiratory: Normal respiratory effort, no increased work of breathing. GI: Abdomen is soft, nontender, nondistended, no abdominal masses GU: No CVA tenderness.  Prostate 60 g, smooth without nodules.  Penis uncircumcised without lesions, testes descended bilaterally without masses or tenderness Skin: No rashes, bruises or suspicious lesions. Lymph: No cervical or inguinal adenopathy. Neurologic: Grossly intact, no focal deficits, moving all 4 extremities. Psychiatric: Normal mood and affect.  Laboratory Data: Lab Results  Component Value Date   WBC 5.5 06/03/2016   HGB 13.3 06/03/2016   HCT 40.2 06/03/2016   MCV 94 06/03/2016   PLT 288 06/03/2016    Lab Results  Component Value Date   CREATININE 0.93 11/01/2016    Lab Results  Component Value Date   PSA1 1.9 06/03/2016    Lab Results  Component Value Date   TESTOSTERONE 364 11/06/2012    Lab Results  Component Value Date   HGBA1C 4.7 11/06/2012     Assessment & Plan:    1. Benign prostatic hyperplasia, unspecified whether lower urinary tract symptoms present PVR by bladder scan was 0 mL.  Medical management of his lower urinary tract symptoms was discussed however he states his voiding symptoms are currently not bothersome enough that he desires to take medication.  He did have a PSA recently drawn at his PCP and we will request the results.  A PSA in 2017 was normal.  - Urinalysis, Complete - BLADDER SCAN AMB NON-IMAGING  2.  Erectile dysfunction We discussed inhibitors, vacuum erection devices and intracavernosal  injections.  He desires a trial of medication and Rx generic sildenafil was sent to his pharmacy.   Return in about 1 year (around 08/15/2018).  Riki Altes, MD  [facility] [address], [address] [place], [place] [address] [phone]

## 2017-08-16 ENCOUNTER — Encounter: Payer: Self-pay | Admitting: Urology

## 2017-08-25 DIAGNOSIS — Z7982 Long term (current) use of aspirin: Secondary | ICD-10-CM | POA: Diagnosis not present

## 2017-08-25 DIAGNOSIS — R509 Fever, unspecified: Secondary | ICD-10-CM | POA: Diagnosis not present

## 2017-08-25 DIAGNOSIS — J019 Acute sinusitis, unspecified: Secondary | ICD-10-CM | POA: Diagnosis not present

## 2017-08-25 DIAGNOSIS — S0101XA Laceration without foreign body of scalp, initial encounter: Secondary | ICD-10-CM | POA: Diagnosis not present

## 2017-08-25 DIAGNOSIS — F17210 Nicotine dependence, cigarettes, uncomplicated: Secondary | ICD-10-CM | POA: Diagnosis not present

## 2017-08-25 DIAGNOSIS — W010XXA Fall on same level from slipping, tripping and stumbling without subsequent striking against object, initial encounter: Secondary | ICD-10-CM | POA: Diagnosis not present

## 2017-08-25 DIAGNOSIS — I493 Ventricular premature depolarization: Secondary | ICD-10-CM | POA: Diagnosis not present

## 2017-08-25 DIAGNOSIS — R748 Abnormal levels of other serum enzymes: Secondary | ICD-10-CM | POA: Diagnosis not present

## 2017-08-25 DIAGNOSIS — I499 Cardiac arrhythmia, unspecified: Secondary | ICD-10-CM | POA: Diagnosis not present

## 2017-08-25 DIAGNOSIS — I10 Essential (primary) hypertension: Secondary | ICD-10-CM | POA: Diagnosis not present

## 2017-08-30 DIAGNOSIS — I739 Peripheral vascular disease, unspecified: Secondary | ICD-10-CM | POA: Diagnosis not present

## 2017-08-30 DIAGNOSIS — B351 Tinea unguium: Secondary | ICD-10-CM | POA: Diagnosis not present

## 2017-08-30 DIAGNOSIS — L84 Corns and callosities: Secondary | ICD-10-CM | POA: Diagnosis not present

## 2017-09-01 ENCOUNTER — Other Ambulatory Visit: Payer: Self-pay | Admitting: Cardiovascular Disease

## 2017-09-01 DIAGNOSIS — I739 Peripheral vascular disease, unspecified: Secondary | ICD-10-CM

## 2017-09-02 DIAGNOSIS — E782 Mixed hyperlipidemia: Secondary | ICD-10-CM | POA: Diagnosis not present

## 2017-09-02 DIAGNOSIS — I10 Essential (primary) hypertension: Secondary | ICD-10-CM | POA: Diagnosis not present

## 2017-09-02 DIAGNOSIS — N400 Enlarged prostate without lower urinary tract symptoms: Secondary | ICD-10-CM | POA: Diagnosis not present

## 2017-09-06 ENCOUNTER — Ambulatory Visit (HOSPITAL_COMMUNITY)
Admission: RE | Admit: 2017-09-06 | Discharge: 2017-09-06 | Disposition: A | Discharge status: Home / Self Care | Payer: BLUE CROSS/BLUE SHIELD | Source: Ambulatory Visit | Attending: Internal Medicine | Admitting: Internal Medicine

## 2017-09-06 DIAGNOSIS — F17200 Nicotine dependence, unspecified, uncomplicated: Secondary | ICD-10-CM | POA: Insufficient documentation

## 2017-09-06 DIAGNOSIS — I10 Essential (primary) hypertension: Secondary | ICD-10-CM | POA: Diagnosis not present

## 2017-09-06 DIAGNOSIS — I739 Peripheral vascular disease, unspecified: Secondary | ICD-10-CM | POA: Insufficient documentation

## 2017-09-06 DIAGNOSIS — E785 Hyperlipidemia, unspecified: Secondary | ICD-10-CM | POA: Insufficient documentation

## 2017-09-16 DIAGNOSIS — I10 Essential (primary) hypertension: Secondary | ICD-10-CM | POA: Diagnosis not present

## 2017-09-16 DIAGNOSIS — E782 Mixed hyperlipidemia: Secondary | ICD-10-CM | POA: Diagnosis not present

## 2017-09-16 DIAGNOSIS — R42 Dizziness and giddiness: Secondary | ICD-10-CM | POA: Diagnosis not present

## 2017-09-16 DIAGNOSIS — J309 Allergic rhinitis, unspecified: Secondary | ICD-10-CM | POA: Diagnosis not present

## 2017-09-26 ENCOUNTER — Encounter: Payer: Self-pay | Admitting: Internal Medicine

## 2017-09-26 DIAGNOSIS — R55 Syncope and collapse: Secondary | ICD-10-CM | POA: Diagnosis not present

## 2017-09-26 DIAGNOSIS — H6120 Impacted cerumen, unspecified ear: Secondary | ICD-10-CM | POA: Diagnosis not present

## 2017-09-30 DIAGNOSIS — I6521 Occlusion and stenosis of right carotid artery: Secondary | ICD-10-CM | POA: Diagnosis not present

## 2017-10-07 DIAGNOSIS — I10 Essential (primary) hypertension: Secondary | ICD-10-CM | POA: Diagnosis not present

## 2017-10-07 DIAGNOSIS — E782 Mixed hyperlipidemia: Secondary | ICD-10-CM | POA: Diagnosis not present

## 2017-10-07 DIAGNOSIS — M609 Myositis, unspecified: Secondary | ICD-10-CM | POA: Diagnosis not present

## 2018-01-13 DIAGNOSIS — I10 Essential (primary) hypertension: Secondary | ICD-10-CM | POA: Diagnosis not present

## 2018-01-13 DIAGNOSIS — E782 Mixed hyperlipidemia: Secondary | ICD-10-CM | POA: Diagnosis not present

## 2018-01-13 DIAGNOSIS — M609 Myositis, unspecified: Secondary | ICD-10-CM | POA: Diagnosis not present

## 2018-03-27 DIAGNOSIS — Z23 Encounter for immunization: Secondary | ICD-10-CM | POA: Diagnosis not present

## 2018-04-21 DIAGNOSIS — F17200 Nicotine dependence, unspecified, uncomplicated: Secondary | ICD-10-CM | POA: Diagnosis not present

## 2018-04-21 DIAGNOSIS — R0789 Other chest pain: Secondary | ICD-10-CM | POA: Diagnosis not present

## 2018-04-21 DIAGNOSIS — R0602 Shortness of breath: Secondary | ICD-10-CM | POA: Diagnosis not present

## 2018-04-21 DIAGNOSIS — E782 Mixed hyperlipidemia: Secondary | ICD-10-CM | POA: Diagnosis not present

## 2018-04-21 DIAGNOSIS — M609 Myositis, unspecified: Secondary | ICD-10-CM | POA: Diagnosis not present

## 2018-04-21 DIAGNOSIS — I10 Essential (primary) hypertension: Secondary | ICD-10-CM | POA: Diagnosis not present

## 2018-04-21 DIAGNOSIS — R9431 Abnormal electrocardiogram [ECG] [EKG]: Secondary | ICD-10-CM | POA: Diagnosis not present

## 2018-04-21 DIAGNOSIS — F17210 Nicotine dependence, cigarettes, uncomplicated: Secondary | ICD-10-CM | POA: Diagnosis not present

## 2018-04-21 DIAGNOSIS — R079 Chest pain, unspecified: Secondary | ICD-10-CM | POA: Diagnosis not present

## 2018-04-26 DIAGNOSIS — R072 Precordial pain: Secondary | ICD-10-CM | POA: Diagnosis not present

## 2018-04-28 DIAGNOSIS — F17210 Nicotine dependence, cigarettes, uncomplicated: Secondary | ICD-10-CM | POA: Diagnosis not present

## 2018-04-28 DIAGNOSIS — K210 Gastro-esophageal reflux disease with esophagitis: Secondary | ICD-10-CM | POA: Diagnosis not present

## 2018-04-28 DIAGNOSIS — I10 Essential (primary) hypertension: Secondary | ICD-10-CM | POA: Diagnosis not present

## 2018-04-28 DIAGNOSIS — R079 Chest pain, unspecified: Secondary | ICD-10-CM | POA: Diagnosis not present

## 2018-04-28 DIAGNOSIS — F17200 Nicotine dependence, unspecified, uncomplicated: Secondary | ICD-10-CM | POA: Diagnosis not present

## 2018-05-02 DIAGNOSIS — R040 Epistaxis: Secondary | ICD-10-CM | POA: Diagnosis not present

## 2018-05-02 DIAGNOSIS — J340 Abscess, furuncle and carbuncle of nose: Secondary | ICD-10-CM | POA: Diagnosis not present

## 2018-05-24 DIAGNOSIS — J340 Abscess, furuncle and carbuncle of nose: Secondary | ICD-10-CM | POA: Diagnosis not present

## 2018-06-29 ENCOUNTER — Encounter: Payer: Self-pay | Admitting: Internal Medicine

## 2018-06-29 ENCOUNTER — Ambulatory Visit: Payer: BLUE CROSS/BLUE SHIELD | Admitting: Internal Medicine

## 2018-06-29 ENCOUNTER — Ambulatory Visit (INDEPENDENT_AMBULATORY_CARE_PROVIDER_SITE_OTHER): Payer: BLUE CROSS/BLUE SHIELD

## 2018-06-29 VITALS — BP 140/88 | HR 64 | Temp 98.1°F | Ht 79.0 in | Wt 229.6 lb

## 2018-06-29 DIAGNOSIS — M5136 Other intervertebral disc degeneration, lumbar region: Secondary | ICD-10-CM | POA: Diagnosis not present

## 2018-06-29 DIAGNOSIS — M5416 Radiculopathy, lumbar region: Secondary | ICD-10-CM

## 2018-06-29 DIAGNOSIS — M25552 Pain in left hip: Secondary | ICD-10-CM

## 2018-06-29 DIAGNOSIS — M25559 Pain in unspecified hip: Secondary | ICD-10-CM | POA: Insufficient documentation

## 2018-06-29 DIAGNOSIS — I10 Essential (primary) hypertension: Secondary | ICD-10-CM | POA: Diagnosis not present

## 2018-06-29 DIAGNOSIS — M25551 Pain in right hip: Secondary | ICD-10-CM

## 2018-06-29 DIAGNOSIS — Z720 Tobacco use: Secondary | ICD-10-CM

## 2018-06-29 DIAGNOSIS — M5135 Other intervertebral disc degeneration, thoracolumbar region: Secondary | ICD-10-CM

## 2018-06-29 DIAGNOSIS — M160 Bilateral primary osteoarthritis of hip: Secondary | ICD-10-CM | POA: Diagnosis not present

## 2018-06-29 MED ORDER — LOSARTAN POTASSIUM 25 MG PO TABS: 25.0000 mg | ORAL_TABLET | Freq: Every day | ORAL | 3 refills | Status: DC

## 2018-06-29 MED ORDER — CHLORTHALIDONE 25 MG PO TABS: 25.0000 mg | ORAL_TABLET | Freq: Every day | ORAL | 3 refills | Status: DC

## 2018-06-29 NOTE — Progress Notes (Addendum)
Chief Complaint  Patient presents with  . New Patient (Initial Visit)   F/u  1. HTN uncontrolled on lotension 10-12.5 mg qd  2. Low back pain and right leg numbness and tingling slumping helps back Tylenol Xtra strength helps at night pain is nagging sore and 6/10. Heavy lifting at work makes pain worse. No pain down left leg  3. C/o b/l hip pain   Review of Systems  Constitutional: Negative for weight loss.  HENT: Negative for hearing loss.   Eyes: Negative for blurred vision.  Respiratory: Negative for shortness of breath.   Cardiovascular: Negative for chest pain.  Gastrointestinal: Negative for abdominal pain.  Musculoskeletal: Positive for back pain and joint pain.  Skin: Negative for rash.  Neurological: Positive for sensory change. Negative for headaches.  Psychiatric/Behavioral: Negative for depression.   Past Medical History:  Diagnosis Date  . Allergy   . Arthritis   . Carpal tunnel syndrome on left   . DDD (degenerative disc disease), thoracolumbar    unknown location   . DDD (degenerative disc disease), thoracolumbar    multilevel  . Degenerative joint disease of left shoulder   . Degenerative joint disease of left shoulder    02/2011   . Diverticulosis    left colon (2008)   . ED (erectile dysfunction)    02/2011   . GERD (gastroesophageal reflux disease)   . H. pylori infection   . Hyperlipidemia   . Hypertension   . Lactose intolerance   . Low back pain   . Smoking    smoking since age 45 y.o  . Vitamin D deficiency    Past Surgical History:  Procedure Laterality Date  . APPENDECTOMY    . BACK SURGERY     x 2 [id number] and 1995 in [place]     2008 diverticulosis  . ESOPHAGOGASTRODUODENOSCOPY     h/o +h. pylori  . OTHER SURGICAL HISTORY     heart catherization 1999 normal   . SPINE SURGERY     x 2    Family History  Problem Relation Age of Onset  . Throat cancer Father 48  . Cancer Maternal Uncle        uncle ?maternal or paternal  died colon cancer in his 81s    Social History   Socioeconomic History  . Marital status: Married    Spouse name: Not on file  . Number of children: Not on file  . Years of education: Not on file  . Highest education level: Not on file  Occupational History  . Not on file  Social Needs  . Financial resource strain: Not on file  . Food insecurity:    Worry: Not on file    Inability: Not on file  . Transportation needs:    Medical: Not on file    Non-medical: Not on file  Tobacco Use  . Smoking status: Current Every Day Smoker    Packs/day: 0.50    Start date: 07/06/1971  . Smokeless tobacco: Never Used  . Tobacco comment: cutting back  Substance and Sexual Activity  . Alcohol use: No    Alcohol/week: 0.0 standard drinks  . Drug use: No  . Sexual activity: Not on file  Lifestyle  . Physical activity:    Days per week: Not on file    Minutes per session: Not on file  . Stress: Not on file  Relationships  . Social connections:    Talks on phone: Not on  file    Gets together: Not on file    Attends religious service: Not on file    Active member of club or organization: Not on file    Attends meetings of clubs or organizations: Not on file    Relationship status: Not on file  . Intimate partner violence:    Fear of current or ex partner: Not on file    Emotionally abused: Not on file    Physically abused: Not on file    Forced sexual activity: Not on file  Other Topics Concern  . 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 [DISCONTINUED] benazepril-hydrochlorthiazide (LOTENSIN HCT) 10-12.5 MG tablet Take 1 tablet by mouth daily.   Allergies  Allergen Reactions  . Tomato Itching   No results found for this or any previous visit (from the past 2160 hour(s)). Objective  Body mass index is 25.87 kg/m. Wt Readings from Last 3  Encounters:  06/29/18 229 lb 9.6 oz (104.1 kg)  08/15/17 223 lb (101.2 kg)  11/01/16 222 lb 9.6 oz (101 kg)   Temp Readings from Last 3 Encounters:  06/29/18 98.1 F (36.7 C) (Oral)  11/01/16 98.1 F (36.7 C) (Oral)  10/14/16 98.5 F (36.9 C) (Oral)   BP Readings from Last 3 Encounters:  06/29/18 140/88  08/15/17 132/69  11/01/16 138/83   Pulse Readings from Last 3 Encounters:  06/29/18 64  08/15/17 80  11/01/16 66    Physical Exam Vitals signs and nursing note reviewed.  Constitutional:      Appearance: Normal appearance.  HENT:     Head: Normocephalic and atraumatic.     Mouth/Throat:     Mouth: Mucous membranes are moist.     Pharynx: Oropharynx is clear.  Eyes:     Conjunctiva/sclera: Conjunctivae normal.     Pupils: Pupils are equal, round, and reactive to light.  Cardiovascular:     Rate and Rhythm: Normal rate and regular rhythm.     Heart sounds: Normal heart sounds.  Pulmonary:     Effort: Pulmonary effort is normal.     Breath sounds: Normal breath sounds.  Musculoskeletal:     Right hip: He exhibits decreased range of motion.     Left hip: He exhibits decreased range of motion.     Lumbar back: He exhibits tenderness.  Skin:    General: Skin is warm and dry.  Neurological:     General: No focal deficit present.     Mental Status: He is alert and oriented to person, place, and time.     Gait: Gait normal.  Psychiatric:        Attention and Perception: Attention and perception normal.        Mood and Affect: Mood and affect normal.        Speech: Speech normal.        Behavior: Behavior normal. Behavior is cooperative.        Thought Content: Thought content normal.        Cognition and Memory: Cognition and memory normal.        Judgment: Judgment normal.     Assessment   1. HTN uncontrolled on lotensin 10-12.5 mg qd  2. Low back pain s/p back surgery x 2 in 1991 and  1995 and b/l hip pain  3. HM Plan  1. D/c lotensin and change to losartan  25 mg qd and chlorthalidone 25 mg qd  2. MRI low back and Xray b/l hips  Prn Tylenol for now  3.  Flu shot get records [facility] to see when had  prevnar and pna 23 utd 06/03/16 and 02/16/15  Tdap had 06/15/13  shingrix consider if not had  Hep B immune  PSA 1.9 07/20/17 with BPH  Hep C neg 06/03/16   Colonoscopy 11/29/06 maybe due for another [facility] Dr. Moshe Salisbury if had there since need to get records at f/u  Get records [facility] to see if needs labs (I.e comprehensive including PSA, MMR, hep B/C)  Tobacco abuse long term-rec smoking cessation 1/2 ppd since age 67 y.o max 1 ppd   Of note called pharmacy recent meds w/in last 6 months 04/2018  Imdur 30 mg ER qd  Pantoprazole 40 mg qd  Flomax 0.4 mg qd   This is my former pt from [facility] w/in the last 1-2 years not new patient to Dr.  Gayland Curry Reviewed records [facility]  CT neck 02/24/17  CT chest neg 02/21/17 right liver lesion  CT chest 04/26/18 normal  CTA carotids 09/30/17 normal  CT ab pelvis small hiatal hernia, 2.2 cm right hepatic lobe cyst, enlarged prostate gland, diverticulosis, multilevel DDD and spondylosis on L spine 07/12/17  US abdomen 03/28/17 right hepatic mass cyst, 2.9 cm ectatic ab aorta risk aneurysm f/u in 5 years  CTA chest score 0 04/26/18  Stress test 04/11/17 EF 39% moderate diffuse HK Echo 04/04/17 abnormal mild to moderate pul regurg -consider repeat echo in future nl LV function  Xray 03/10/16 mild OA hips b/l   Labs 02/23/17  CMET nl, CBC 6.0/13.8/41.9/282, tc 179, tg 105, hdl 66, ldl 92, TSH 0.740, free T3 3.4 nl Ft4 1.19 normal  CK 770 high nl is 204 to 879 to 998 to 972 to 583 01/13/18  Hep B core Ab totatl +  UA normal  Aldolase 03/22/17 6.8 normal  04/05/17 lipid TC 209, TG 89, HDL 63, LDL 128  01/13/18 tc 202, tg 115, hdl 63, ldl 116   Hep B sAb quant 14.8  Labs 07/12/17 cmet nl cbc hbg 12.9  07/12/17 lipid tc 200, tg 73, hdl 70, ldl 115  ldh 01/13/18 262 elevated nl 224   Provider: Dr.  Olivia Mackie [name]

## 2018-06-29 NOTE — Patient Instructions (Signed)
DASH Eating Plan  DASH stands for "Dietary Approaches to Stop Hypertension." The DASH eating plan is a healthy eating plan that has been shown to reduce high blood pressure (hypertension). It may also reduce your risk for type 2 diabetes, heart disease, and stroke. The DASH eating plan may also help with weight loss.  What are tips for following this plan?    General guidelines  · Avoid eating more than 2,300 mg (milligrams) of salt (sodium) a day. If you have hypertension, you may need to reduce your sodium intake to 1,500 mg a day.  · Limit alcohol intake to no more than 1 drink a day for nonpregnant women and 2 drinks a day for men. One drink equals 12 oz of beer, 5 oz of wine, or 1½ oz of hard liquor.  · Work with your health care provider to maintain a healthy body weight or to lose weight. Ask what an ideal weight is for you.  · Get at least 30 minutes of exercise that causes your heart to beat faster (aerobic exercise) most days of the week. Activities may include walking, swimming, or biking.  · Work with your health care provider or diet and nutrition specialist (dietitian) to adjust your eating plan to your individual calorie needs.  Reading food labels    · Check food labels for the amount of sodium per serving. Choose foods with less than 5 percent of the Daily Value of sodium. Generally, foods with less than 300 mg of sodium per serving fit into this eating plan.  · To find whole grains, look for the word "whole" as the first word in the ingredient list.  Shopping  · Buy products labeled as "low-sodium" or "no salt added."  · Buy fresh foods. Avoid canned foods and premade or frozen meals.  Cooking  · Avoid adding salt when cooking. Use salt-free seasonings or herbs instead of table salt or sea salt. Check with your health care provider or pharmacist before using salt substitutes.  · Do not fry foods. Cook foods using healthy methods such as baking, boiling, grilling, and broiling instead.  · Cook with  heart-healthy oils, such as olive, canola, soybean, or sunflower oil.  Meal planning  · Eat a balanced diet that includes:  ? 5 or more servings of fruits and vegetables each day. At each meal, try to fill half of your plate with fruits and vegetables.  ? Up to 6-8 servings of whole grains each day.  ? Less than 6 oz of lean meat, poultry, or fish each day. A 3-oz serving of meat is about the same size as a deck of cards. One egg equals 1 oz.  ? 2 servings of low-fat dairy each day.  ? A serving of nuts, seeds, or beans 5 times each week.  ? Heart-healthy fats. Healthy fats called Omega-3 fatty acids are found in foods such as flaxseeds and coldwater fish, like sardines, salmon, and mackerel.  · Limit how much you eat of the following:  ? Canned or prepackaged foods.  ? Food that is high in trans fat, such as fried foods.  ? Food that is high in saturated fat, such as fatty meat.  ? Sweets, desserts, sugary drinks, and other foods with added sugar.  ? Full-fat dairy products.  · Do not salt foods before eating.  · Try to eat at least 2 vegetarian meals each week.  · Eat more home-cooked food and less restaurant, buffet, and fast food.  ·   When eating at a restaurant, ask that your food be prepared with less salt or no salt, if possible.  What foods are recommended?  The items listed may not be a complete list. Talk with your dietitian about what dietary choices are best for you.  Grains  Whole-grain or whole-wheat bread. Whole-grain or whole-wheat pasta. Brown rice. Oatmeal. Quinoa. Bulgur. Whole-grain and low-sodium cereals. Pita bread. Low-fat, low-sodium crackers. Whole-wheat flour tortillas.  Vegetables  Fresh or frozen vegetables (raw, steamed, roasted, or grilled). Low-sodium or reduced-sodium tomato and vegetable juice. Low-sodium or reduced-sodium tomato sauce and tomato paste. Low-sodium or reduced-sodium canned vegetables.  Fruits  All fresh, dried, or frozen fruit. Canned fruit in natural juice (without  added sugar).  Meat and other protein foods  Skinless chicken or turkey. Ground chicken or turkey. Pork with fat trimmed off. Fish and seafood. Egg whites. Dried beans, peas, or lentils. Unsalted nuts, nut butters, and seeds. Unsalted canned beans. Lean cuts of beef with fat trimmed off. Low-sodium, lean deli meat.  Dairy  Low-fat (1%) or fat-free (skim) milk. Fat-free, low-fat, or reduced-fat cheeses. Nonfat, low-sodium ricotta or cottage cheese. Low-fat or nonfat yogurt. Low-fat, low-sodium cheese.  Fats and oils  Soft margarine without trans fats. Vegetable oil. Low-fat, reduced-fat, or light mayonnaise and salad dressings (reduced-sodium). Canola, safflower, olive, soybean, and sunflower oils. Avocado.  Seasoning and other foods  Herbs. Spices. Seasoning mixes without salt. Unsalted popcorn and pretzels. Fat-free sweets.  What foods are not recommended?  The items listed may not be a complete list. Talk with your dietitian about what dietary choices are best for you.  Grains  Baked goods made with fat, such as croissants, muffins, or some breads. Dry pasta or rice meal packs.  Vegetables  Creamed or fried vegetables. Vegetables in a cheese sauce. Regular canned vegetables (not low-sodium or reduced-sodium). Regular canned tomato sauce and paste (not low-sodium or reduced-sodium). Regular tomato and vegetable juice (not low-sodium or reduced-sodium). Pickles. Olives.  Fruits  Canned fruit in a light or heavy syrup. Fried fruit. Fruit in cream or butter sauce.  Meat and other protein foods  Fatty cuts of meat. Ribs. Fried meat. Bacon. Sausage. Bologna and other processed lunch meats. Salami. Fatback. Hotdogs. Bratwurst. Salted nuts and seeds. Canned beans with added salt. Canned or smoked fish. Whole eggs or egg yolks. Chicken or turkey with skin.  Dairy  Whole or 2% milk, cream, and half-and-half. Whole or full-fat cream cheese. Whole-fat or sweetened yogurt. Full-fat cheese. Nondairy creamers. Whipped toppings.  Processed cheese and cheese spreads.  Fats and oils  Butter. Stick margarine. Lard. Shortening. Ghee. Bacon fat. Tropical oils, such as coconut, palm kernel, or palm oil.  Seasoning and other foods  Salted popcorn and pretzels. Onion salt, garlic salt, seasoned salt, table salt, and sea salt. Worcestershire sauce. Tartar sauce. Barbecue sauce. Teriyaki sauce. Soy sauce, including reduced-sodium. Steak sauce. Canned and packaged gravies. Fish sauce. Oyster sauce. Cocktail sauce. Horseradish that you find on the shelf. Ketchup. Mustard. Meat flavorings and tenderizers. Bouillon cubes. Hot sauce and Tabasco sauce. Premade or packaged marinades. Premade or packaged taco seasonings. Relishes. Regular salad dressings.  Where to find more information:  · National Heart, Lung, and Blood Institute: www.nhlbi.nih.gov  · American Heart Association: www.heart.org  Summary  · The DASH eating plan is a healthy eating plan that has been shown to reduce high blood pressure (hypertension). It may also reduce your risk for type 2 diabetes, heart disease, and stroke.  · With the   DASH eating plan, you should limit salt (sodium) intake to 2,300 mg a day. If you have hypertension, you may need to reduce your sodium intake to 1,500 mg a day.  · When on the DASH eating plan, aim to eat more fresh fruits and vegetables, whole grains, lean proteins, low-fat dairy, and heart-healthy fats.  · Work with your health care provider or diet and nutrition specialist (dietitian) to adjust your eating plan to your individual calorie needs.  This information is not intended to replace advice given to you by your health care provider. Make sure you discuss any questions you have with your health care provider.  Document Released: 06/10/2011 Document Revised: 06/14/2016 Document Reviewed: 06/14/2016  Elsevier Interactive Patient Education © 2019 [employer]        Hypertension  Hypertension, commonly called high blood pressure, is when the force of  blood pumping through the arteries is too strong. The arteries are the blood vessels that carry blood from the heart throughout the body. Hypertension forces the heart to work harder to pump blood and may cause arteries to become narrow or stiff. Having untreated or uncontrolled hypertension can cause heart attacks, strokes, kidney disease, and other problems.  A blood pressure reading consists of a higher number over a lower number. Ideally, your blood pressure should be below 120/80. The first ("top") number is called the systolic pressure. It is a measure of the pressure in your arteries as your heart beats. The second ("bottom") number is called the diastolic pressure. It is a measure of the pressure in your arteries as the heart relaxes.  What are the causes?  The cause of this condition is not known.  What increases the risk?  Some risk factors for high blood pressure are under your control. Others are not.  Factors you can change  · Smoking.  · Having type 2 diabetes mellitus, high cholesterol, or both.  · Not getting enough exercise or physical activity.  · Being overweight.  · Having too much fat, sugar, calories, or salt (sodium) in your diet.  · Drinking too much alcohol.  Factors that are difficult or impossible to change  · Having chronic kidney disease.  · Having a family history of high blood pressure.  · Age. Risk increases with age.  · Race. You may be at higher risk if you are African-American.  · Gender. Men are at higher risk than women before age 45. After age 65, women are at higher risk than men.  · Having obstructive sleep apnea.  · Stress.  What are the signs or symptoms?  Extremely high blood pressure (hypertensive crisis) may cause:  · Headache.  · Anxiety.  · Shortness of breath.  · Nosebleed.  · Nausea and vomiting.  · Severe chest pain.  · Jerky movements you cannot control (seizures).  How is this diagnosed?  This condition is diagnosed by measuring your blood pressure while you are  seated, with your arm resting on a surface. The cuff of the blood pressure monitor will be placed directly against the skin of your upper arm at the level of your heart. It should be measured at least twice using the same arm. Certain conditions can cause a difference in blood pressure between your right and left arms.  Certain factors can cause blood pressure readings to be lower or higher than normal (elevated) for a short period of time:  · When your blood pressure is higher when you are in a health care   provider's office than when you are at home, this is called white coat hypertension. Most people with this condition do not need medicines.  · When your blood pressure is higher at home than when you are in a health care provider's office, this is called masked hypertension. Most people with this condition may need medicines to control blood pressure.  If you have a high blood pressure reading during one visit or you have normal blood pressure with other risk factors:  · You may be asked to return on a different day to have your blood pressure checked again.  · You may be asked to monitor your blood pressure at home for 1 week or longer.  If you are diagnosed with hypertension, you may have other blood or imaging tests to help your health care provider understand your overall risk for other conditions.  How is this treated?  This condition is treated by making healthy lifestyle changes, such as eating healthy foods, exercising more, and reducing your alcohol intake. Your health care provider may prescribe medicine if lifestyle changes are not enough to get your blood pressure under control, and if:  · Your systolic blood pressure is above 130.  · Your diastolic blood pressure is above 80.  Your personal target blood pressure may vary depending on your medical conditions, your age, and other factors.  Follow these instructions at home:  Eating and drinking    · Eat a diet that is high in fiber and potassium, and  low in sodium, added sugar, and fat. An example eating plan is called the DASH (Dietary Approaches to Stop Hypertension) diet. To eat this way:  ? Eat plenty of fresh fruits and vegetables. Try to fill half of your plate at each meal with fruits and vegetables.  ? Eat whole grains, such as whole wheat pasta, brown rice, or whole grain bread. Fill about one quarter of your plate with whole grains.  ? Eat or drink low-fat dairy products, such as skim milk or low-fat yogurt.  ? Avoid fatty cuts of meat, processed or cured meats, and poultry with skin. Fill about one quarter of your plate with lean proteins, such as fish, chicken without skin, beans, eggs, and tofu.  ? Avoid premade and processed foods. These tend to be higher in sodium, added sugar, and fat.  · Reduce your daily sodium intake. Most people with hypertension should eat less than 1,500 mg of sodium a day.  · Limit alcohol intake to no more than 1 drink a day for nonpregnant women and 2 drinks a day for men. One drink equals 12 oz of beer, 5 oz of wine, or 1½ oz of hard liquor.  Lifestyle    · Work with your health care provider to maintain a healthy body weight or to lose weight. Ask what an ideal weight is for you.  · Get at least 30 minutes of exercise that causes your heart to beat faster (aerobic exercise) most days of the week. Activities may include walking, swimming, or biking.  · Include exercise to strengthen your muscles (resistance exercise), such as pilates or lifting weights, as part of your weekly exercise routine. Try to do these types of exercises for 30 minutes at least 3 days a week.  · Do not use any products that contain nicotine or tobacco, such as cigarettes and e-cigarettes. If you need help quitting, ask your health care provider.  · Monitor your blood pressure at home as told by your health   care provider.  · Keep all follow-up visits as told by your health care provider. This is important.  Medicines  · Take over-the-counter and  prescription medicines only as told by your health care provider. Follow directions carefully. Blood pressure medicines must be taken as prescribed.  · Do not skip doses of blood pressure medicine. Doing this puts you at risk for problems and can make the medicine less effective.  · Ask your health care provider about side effects or reactions to medicines that you should watch for.  Contact a health care provider if:  · You think you are having a reaction to a medicine you are taking.  · You have headaches that keep coming back (recurring).  · You feel dizzy.  · You have swelling in your ankles.  · You have trouble with your vision.  Get help right away if:  · You develop a severe headache or confusion.  · You have unusual weakness or numbness.  · You feel faint.  · You have severe pain in your chest or abdomen.  · You vomit repeatedly.  · You have trouble breathing.  Summary  · Hypertension is when the force of blood pumping through your arteries is too strong. If this condition is not controlled, it may put you at risk for serious complications.  · Your personal target blood pressure may vary depending on your medical conditions, your age, and other factors. For most people, a normal blood pressure is less than 120/80.  · Hypertension is treated with lifestyle changes, medicines, or a combination of both. Lifestyle changes include weight loss, eating a healthy, low-sodium diet, exercising more, and limiting alcohol.  This information is not intended to replace advice given to you by your health care provider. Make sure you discuss any questions you have with your health care provider.  Document Released: 06/21/2005 Document Revised: 05/19/2016 Document Reviewed: 05/19/2016  Elsevier Interactive Patient Education © 2019 [employer]

## 2018-07-14 ENCOUNTER — Other Ambulatory Visit: Payer: Self-pay | Admitting: Internal Medicine

## 2018-07-16 ENCOUNTER — Ambulatory Visit
Admission: RE | Admit: 2018-07-16 | Discharge: 2018-07-16 | Disposition: A | Discharge status: Home / Self Care | Payer: BLUE CROSS/BLUE SHIELD | Source: Ambulatory Visit | Attending: Internal Medicine | Admitting: Internal Medicine

## 2018-07-16 DIAGNOSIS — M5416 Radiculopathy, lumbar region: Secondary | ICD-10-CM

## 2018-07-16 DIAGNOSIS — M47819 Spondylosis without myelopathy or radiculopathy, site unspecified: Secondary | ICD-10-CM | POA: Diagnosis not present

## 2018-07-16 DIAGNOSIS — M5136 Other intervertebral disc degeneration, lumbar region: Secondary | ICD-10-CM

## 2018-07-20 ENCOUNTER — Other Ambulatory Visit: Payer: Self-pay | Admitting: Internal Medicine

## 2018-07-20 DIAGNOSIS — M5416 Radiculopathy, lumbar region: Secondary | ICD-10-CM

## 2018-07-26 DIAGNOSIS — I10 Essential (primary) hypertension: Secondary | ICD-10-CM | POA: Diagnosis not present

## 2018-07-26 DIAGNOSIS — R079 Chest pain, unspecified: Secondary | ICD-10-CM | POA: Diagnosis not present

## 2018-07-26 DIAGNOSIS — R05 Cough: Secondary | ICD-10-CM | POA: Diagnosis not present

## 2018-07-26 DIAGNOSIS — R51 Headache: Secondary | ICD-10-CM | POA: Diagnosis not present

## 2018-07-26 DIAGNOSIS — F17210 Nicotine dependence, cigarettes, uncomplicated: Secondary | ICD-10-CM | POA: Diagnosis not present

## 2018-07-26 DIAGNOSIS — R509 Fever, unspecified: Secondary | ICD-10-CM | POA: Diagnosis not present

## 2018-07-26 DIAGNOSIS — R21 Rash and other nonspecific skin eruption: Secondary | ICD-10-CM | POA: Diagnosis not present

## 2018-07-26 DIAGNOSIS — B029 Zoster without complications: Secondary | ICD-10-CM | POA: Diagnosis not present

## 2018-07-28 ENCOUNTER — Ambulatory Visit: Payer: Self-pay

## 2018-07-28 NOTE — Telephone Encounter (Signed)
Outgoing call to Patient  Who complains complains about.  His body feeling sore from shingles.  And having a cough.  Maim complaint is body feeling sore from   Shingles  .  Patient states that The rash started out on his back first.  Now has the rash on his chest.   Onset  Was Sunday or Monday.   Denies itching.   Rates Soreness as moderate.  Patient also complains of a moderate headache. States his" head is about to burst."  Patient is taking medications that are Prescribed.  Ibuprofen and valacyclovir. Patient has an appointment scheduled for 08/01/18 @ 11:00am.  Encourage Patient to Keep appointmentt.  Call back  If Sx.  Worsen.   Reviewed care advice voiced understanding.                                                    Reason for Disposition . [1] Looks infected (spreading redness, pus) AND [2] no fever  Answer Assessment - Initial Assessment Questions 1. ONSET: "When did the cough begin?"      *No Answer*Tuesday night 2. SEVERITY: "How bad is the cough today?"      severe 3. RESPIRATORY DISTRESS: "Describe your breathing."      " justst fine" 4. FEVER: "Do you have a fever?" If so, ask: "What is your temperature, how was it measured, and when did it start?"     denies 5. HEMOPTYSIS: "Are you coughing up any blood?" If so ask: "How much?" (flecks, streaks, tablespoons, etc.)     denies 6. TREATMENT: "What have you done so far to treat the cough?" (e.g., meds, fluids, humidifier)     Just the medication that was prescribed 7. CARDIAC HISTORY: "Do you have any history of heart disease?" (e.g., heart attack, congestive heart failure)      denies 8. LUNG HISTORY: "Do you have any history of lung disease?"  (e.g., pulmonary embolus, asthma, emphysema)      denies 9. PE RISK FACTORS: "Do you have a history of blood clots?" (or: recent major surgery, recent prolonged travel, bedridden)     Denies  10. OTHER SYMPTOMS: "Do you have any other symptoms? (e.g., runny nose, wheezing, chest pain)      denies 11. PREGNANCY: "Is there any chance you are pregnant?" "When was your last menstrual period?"       Na  12. TRAVEL: "Have you traveled out of the country in the last month?" (e.g., travel history, exposures)       denies  Answer Assessment - Initial Assessment Questions 1. APPEARANCE of RASH: "Describe the rash."      Streaks of red with bumps,  Looks like the may bursrt  Any time 2. LOCATION: "Where is the rash located?"      Chest and back 3. ONSET: "When did the rash start?"      Sunday or Monday 4. ITCHING: "Does the rash itch?" If so, ask: "How bad is the itch?"  (Scale 1-10; or mild, moderate, severe)     not 5. PAIN: "Does the rash hurt?" If so, ask: "How bad is the pain?"  (Scale 1-10; or mild, moderate, severe)     Soreness moderate 6. OTHER SYMPTOMS: "Do you have any other symptoms?" (e.g., fever)     denies 7. PREGNANCY: "Is there any chance you are  pregnant?" "When was your last menstrual period?"     na  Protocols used: SHINGLES-A-AH, COUGH - ACUTE NON-PRODUCTIVE-A-AH

## 2018-07-30 ENCOUNTER — Telehealth: Payer: Self-pay | Admitting: Internal Medicine

## 2018-07-30 ENCOUNTER — Encounter: Payer: Self-pay | Admitting: Internal Medicine

## 2018-07-30 NOTE — Telephone Encounter (Signed)
Call pt appears he was seen 07/26/2018 somewhere  For shingles and possible cough is this correct? And h/a ? He has appt 08/01/2018 how is he doing? Does he need sooner appt Monday?   TMS

## 2018-07-31 NOTE — Telephone Encounter (Signed)
Patient stated he could wait till tomorrow.

## 2018-08-01 ENCOUNTER — Encounter: Payer: Self-pay | Admitting: Internal Medicine

## 2018-08-01 ENCOUNTER — Ambulatory Visit: Payer: BLUE CROSS/BLUE SHIELD | Admitting: Internal Medicine

## 2018-08-01 VITALS — BP 132/76 | HR 86 | Temp 98.2°F | Ht 79.0 in | Wt 227.8 lb

## 2018-08-01 DIAGNOSIS — M5416 Radiculopathy, lumbar region: Secondary | ICD-10-CM | POA: Diagnosis not present

## 2018-08-01 DIAGNOSIS — Z0184 Encounter for antibody response examination: Secondary | ICD-10-CM

## 2018-08-01 DIAGNOSIS — I10 Essential (primary) hypertension: Secondary | ICD-10-CM

## 2018-08-01 DIAGNOSIS — E559 Vitamin D deficiency, unspecified: Secondary | ICD-10-CM

## 2018-08-01 DIAGNOSIS — R300 Dysuria: Secondary | ICD-10-CM

## 2018-08-01 DIAGNOSIS — M5135 Other intervertebral disc degeneration, thoracolumbar region: Secondary | ICD-10-CM

## 2018-08-01 DIAGNOSIS — N400 Enlarged prostate without lower urinary tract symptoms: Secondary | ICD-10-CM

## 2018-08-01 DIAGNOSIS — M25551 Pain in right hip: Secondary | ICD-10-CM

## 2018-08-01 DIAGNOSIS — B029 Zoster without complications: Secondary | ICD-10-CM

## 2018-08-01 DIAGNOSIS — M25552 Pain in left hip: Secondary | ICD-10-CM

## 2018-08-01 DIAGNOSIS — Z1159 Encounter for screening for other viral diseases: Secondary | ICD-10-CM

## 2018-08-01 DIAGNOSIS — Z1329 Encounter for screening for other suspected endocrine disorder: Secondary | ICD-10-CM

## 2018-08-01 DIAGNOSIS — E785 Hyperlipidemia, unspecified: Secondary | ICD-10-CM

## 2018-08-01 MED ORDER — GABAPENTIN 300 MG PO CAPS: 300.0000 mg | ORAL_CAPSULE | Freq: Every day | ORAL | 3 refills | Status: DC

## 2018-08-01 MED ORDER — VALACYCLOVIR HCL 1 G PO TABS: 1000.0000 mg | ORAL_TABLET | Freq: Three times a day (TID) | ORAL | 0 refills | Status: DC

## 2018-08-01 MED ORDER — OXYCODONE-ACETAMINOPHEN 5-325 MG PO TABS: 1.0000 | ORAL_TABLET | Freq: Two times a day (BID) | ORAL | 0 refills | Status: DC | PRN

## 2018-08-01 NOTE — Progress Notes (Signed)
Chief Complaint  Patient presents with  . Follow-up    shingles   Shingles and htn  1. Shingles started 07/23/2018 with right back pain and also on right chest sore since saw ED 07/26/18 given valtrex 1 g tid #20 and no refills and ibuprofen. He is still in pain and right upper abdomen/chest feels "raw". He has been out of work and needs note for work  2.HTN improved on losartan 25 chlorthalidone qd  3. Enlarged prostate on imaging he is not having sx's other than burning with urination now and nocturia urology appt 08/15/2018 1 year f/u Dr. Bernardo Heater pt is not taking Flomax     Review of Systems  Constitutional: Negative for weight loss.  HENT: Negative for hearing loss.   Eyes: Negative for blurred vision.  Respiratory: Negative for shortness of breath.   Cardiovascular: Negative for chest pain.  Gastrointestinal: Negative for abdominal pain.  Musculoskeletal: Positive for back pain.  Skin: Positive for rash.  Neurological: Negative for headaches.  Psychiatric/Behavioral: Negative for depression.   Past Medical History:  Diagnosis Date  . Allergy   . Arthritis   . Carpal tunnel syndrome on left   . CTS (carpal tunnel syndrome)    left  . DDD (degenerative disc disease), thoracolumbar    unknown location   . DDD (degenerative disc disease), thoracolumbar    multilevel  . Degenerative joint disease of left shoulder   . Degenerative joint disease of left shoulder    02/2011   . Diverticulosis    left colon (2008)   . ED (erectile dysfunction)    02/2011   . ED (erectile dysfunction)   . GERD (gastroesophageal reflux disease)   . H. pylori infection   . H. pylori infection   . Hyperlipidemia   . Hypertension   . Insomnia   . Lactose intolerance   . Lactose intolerance   . Low back pain   . Smoking    smoking since age 78 y.o  . Toe fracture, left    4th in 2014  . Vitamin D deficiency   . Vitamin D deficiency    Past Surgical History:  Procedure Laterality Date  .  APPENDECTOMY    . BACK SURGERY     x 2 10991 and 1995 in [place]   . CARDIAC CATHETERIZATION     1999 nl  . COLONOSCOPY     2008 diverticulosis  . ESOPHAGOGASTRODUODENOSCOPY     h/o +h. pylori  . OTHER SURGICAL HISTORY     heart catherization 1999 normal   . SPINE SURGERY     x 2    Family History  Problem Relation Age of Onset  . Heart disease Mother        age 53   . Throat cancer Father 52  . Cancer Father        throat   . Cancer Maternal Uncle        uncle ?maternal or paternal died colon cancer in his 57s   . Diabetes Maternal Aunt    Social History   Socioeconomic History  . Marital status: Married    Spouse name: Not on file  . Number of children: Not on file  . Years of education: Not on file  . Highest education level: Not on file  Occupational History  . Not on file  Social Needs  . Financial resource strain: Not on file  . Food insecurity:    Worry: Not on file  Inability: Not on file  . Transportation needs:    Medical: Not on file    Non-medical: Not on file  Tobacco Use  . Smoking status: Current Every Day Smoker    Packs/day: 0.50    Start date: 07/06/1971  . Smokeless tobacco: Never Used  . Tobacco comment: cutting back  Substance and Sexual Activity  . Alcohol use: No    Alcohol/week: 0.0 standard drinks  . Drug use: No  . Sexual activity: Not on file  Lifestyle  . Physical activity:    Days per week: Not on file    Minutes per session: Not on file  . Stress: Not on file  Relationships  . Social connections:    Talks on phone: Not on file    Gets together: Not on file    Attends religious service: Not on file    Active member of club or organization: Not on file    Attends meetings of clubs or organizations: Not on file    Relationship status: Not on file  . Intimate partner violence:    Fear of current or ex partner: Not on file    Emotionally abused: Not on file    Physically abused: Not on file    Forced sexual activity: Not on  file  Other Topics Concern  . 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 chlorthalidone (HYGROTON) 25 MG tablet Take 1 tablet (25 mg total) by mouth daily.  Marland Kitchen losartan (COZAAR) 25 MG tablet Take 1 tablet (25 mg total) by mouth daily. In am   Allergies  Allergen Reactions  . Crestor [Rosuvastatin Calcium]     Elevated muscle enzymes   . Lipitor [Atorvastatin Calcium]     Elevated muscle enzymes   . Tomato Itching   No results found for this or any previous visit (from the past 2160 hour(s)). Objective  Body mass index is 25.66 kg/m. Wt Readings from Last 3 Encounters:  08/01/18 227 lb 12.8 oz (103.3 kg)  06/29/18 229 lb 9.6 oz (104.1 kg)  08/15/17 223 lb (101.2 kg)   Temp Readings from Last 3 Encounters:  08/01/18 98.2 F (36.8 C) (Oral)  06/29/18 98.1 F (36.7 C) (Oral)  11/01/16 98.1 F (36.7 C) (Oral)   BP Readings from Last 3 Encounters:  08/01/18 132/76  06/29/18 140/88  08/15/17 132/69   Pulse Readings from Last 3 Encounters:  08/01/18 86  06/29/18 64  08/15/17 80    Physical Exam Vitals signs and nursing note reviewed.  Constitutional:      Appearance: Normal appearance. He is well-developed and well-groomed.  HENT:     Head: Normocephalic and atraumatic.     Nose: Nose normal.     Mouth/Throat:     Mouth: Mucous membranes are moist.     Pharynx: Oropharynx is clear.  Eyes:     Conjunctiva/sclera: Conjunctivae normal.     Pupils: Pupils are equal, round, and reactive to light.  Cardiovascular:     Rate and Rhythm: Normal rate and regular rhythm.     Heart sounds: Normal heart sounds.  Pulmonary:     Effort: Pulmonary effort is normal.     Breath sounds: Normal breath sounds.  Skin:    General: Skin is warm and dry.  Findings: Rash present. Rash is vesicular.       Neurological:     General: No  focal deficit present.     Mental Status: He is alert and oriented to person, place, and time.     Gait: Gait normal.  Psychiatric:        Attention and Perception: Attention and perception normal.        Mood and Affect: Mood and affect normal.        Speech: Speech normal.        Behavior: Behavior normal. Behavior is cooperative.        Thought Content: Thought content normal.        Cognition and Memory: Cognition and memory normal.        Judgment: Judgment normal.     Assessment   1. Shingles  2. HTN h/o HLD  3. HM 4. Chronic low back pain  5. dysuria Plan   1. Valtrex 1 tid x 1 more week off work x 1 more week  Gabapentin 300 mg qhs  Percocet prn  rec lidocaine patch otc  2. Cont meds Fasting labs tbd before next visit  3.  Flu shot ? If had this year [facility]2018 may have had there check ncir  prevnar and pna 23 utd 06/03/16 and 02/16/15  Tdap had 06/15/13  shingrix consider  in 3 months active shingle snow  Hep B immune  PSA 1.9 07/20/17 with BPH 08/2017 urology Dr. Bernardo Heater  Hep C neg 06/03/16  Check MMR  Colonoscopy 11/29/06 maybe due for another [facility] hospital Dr. Moshe Salisbury  -had since signed release for records today  Tobacco abuse long term-rec smoking cessation 1/2 ppd since age 43 y.o max 1 ppd  4. Check on referral Dr. Mina Marble  Add gabapentin 300 mg qhs see if helps 5. UA and culture today  Provider: Dr. Olivia Mackie [name]-[facility]

## 2018-08-01 NOTE — Progress Notes (Signed)
Pre visit review using our clinic review tool, if applicable. No additional management support is needed unless otherwise documented below in the visit note. 

## 2018-08-01 NOTE — Patient Instructions (Addendum)
Try Lidocaine patches over the counter on areas of shingles   Shingles  Shingles, which is also known as herpes zoster, is an infection that causes a painful skin rash and fluid-filled blisters. It is caused by a virus. Shingles only develops in people who:  Have had chickenpox.  Have been given a medicine to protect against chickenpox (have been vaccinated). Shingles is rare in this group. What are the causes? Shingles is caused by varicella-zoster virus (VZV). This is the same virus that causes chickenpox. After a person is exposed to VZV, the virus stays in the body in an inactive (dormant) state. Shingles develops if the virus is reactivated. This can happen many years after the first (initial) exposure to VZV. It is not known what causes this virus to be reactivated. What increases the risk? People who have had chickenpox or received the chickenpox vaccine are at risk for shingles. Shingles infection is more common in people who:  Are older than age 97.  Have a weakened disease-fighting system (immune system), such as people with: ? HIV. ? AIDS. ? Cancer.  Are taking medicines that weaken the immune system, such as transplant medicines.  Are experiencing a lot of stress. What are the signs or symptoms? Early symptoms of this condition include itching, tingling, and pain in an area on your skin. Pain may be described as burning, stabbing, or throbbing. A few days or weeks after early symptoms start, a painful red rash appears. The rash is usually on one side of the body and has a band-like or belt-like pattern. The rash eventually turns into fluid-filled blisters that break open, change into scabs, and dry up in about 2-3 weeks. At any time during the infection, you may also develop:  A fever.  Chills.  A headache.  An upset stomach. How is this diagnosed? This condition is diagnosed with a skin exam. Skin or fluid samples may be taken from the blisters before a diagnosis is  made. These samples are examined under a microscope or sent to a lab for testing. How is this treated? The rash may last for several weeks. There is not a specific cure for this condition. Your health care provider will probably prescribe medicines to help you manage pain, recover more quickly, and avoid long-term problems. Medicines may include:  Antiviral drugs.  Anti-inflammatory drugs.  Pain medicines.  Anti-itching medicines (antihistamines). If the area involved is on your face, you may be referred to a specialist, such as an eye doctor (ophthalmologist) or an ear, nose, and throat (ENT) doctor (otolaryngologist) to help you avoid eye problems, chronic pain, or disability. Follow these instructions at home: Medicines  Take over-the-counter and prescription medicines only as told by your health care provider.  Apply an anti-itch cream or numbing cream to the affected area as told by your health care provider. Relieving itching and discomfort   Apply cold, wet cloths (cold compresses) to the area of the rash or blisters as told by your health care provider.  Cool baths can be soothing. Try adding baking soda or dry oatmeal to the water to reduce itching. Do not bathe in hot water. Blister and rash care  Keep your rash covered with a loose bandage (dressing). Wear loose-fitting clothing to help ease the pain of material rubbing against the rash.  Keep your rash and blisters clean by washing the area with mild soap and cool water as told by your health care provider.  Check your rash every day for signs  of infection. Check for: ? More redness, swelling, or pain. ? Fluid or blood. ? Warmth. ? Pus or a bad smell.  Do not scratch your rash or pick at your blisters. To help avoid scratching: ? Keep your fingernails clean and cut short. ? Wear gloves or mittens while you sleep, if scratching is a problem. General instructions  Rest as told by your health care provider.  Keep  all follow-up visits as told by your health care provider. This is important.  Wash your hands often with soap and water. If soap and water are not available, use hand sanitizer. Doing this lowers your chance of getting a bacterial skin infection.  Before your blisters change into scabs, your shingles infection can cause chickenpox in people who have never had it or have never been vaccinated against it. To prevent this from happening, avoid contact with other people, especially: ? Babies. ? Pregnant women. ? Children who have eczema. ? Elderly people who have transplants. ? People who have chronic illnesses, such as cancer or AIDS. Contact a health care provider if:  Your pain is not relieved with prescribed medicines.  Your pain does not get better after the rash heals.  You have signs of infection in the rash area, such as: ? More redness, swelling, or pain around the rash. ? Fluid or blood coming from the rash. ? The rash area feeling warm to the touch. ? Pus or a bad smell coming from the rash. Get help right away if:  The rash is on your face or nose.  You have facial pain, pain around your eye area, or loss of feeling on one side of your face.  You have difficulty seeing.  You have ear pain or have ringing in your ear.  You have a loss of taste.  Your condition gets worse. Summary  Shingles, which is also known as herpes zoster, is an infection that causes a painful skin rash and fluid-filled blisters.  This condition is diagnosed with a skin exam. Skin or fluid samples may be taken from the blisters and examined before the diagnosis is made.  Keep your rash covered with a loose bandage (dressing). Wear loose-fitting clothing to help ease the pain of material rubbing against the rash.  Before your blisters change into scabs, your shingles infection can cause chickenpox in people who have never had it or have never been vaccinated against it. This information is not  intended to replace advice given to you by your health care provider. Make sure you discuss any questions you have with your health care provider. Document Released: 06/21/2005 Document Revised: 02/23/2017 Document Reviewed: 02/23/2017 Elsevier Interactive Patient Education  2019 [employer].

## 2018-08-02 ENCOUNTER — Ambulatory Visit: Payer: BLUE CROSS/BLUE SHIELD | Admitting: Internal Medicine

## 2018-08-02 ENCOUNTER — Telehealth: Payer: Self-pay | Admitting: *Deleted

## 2018-08-02 LAB — URINALYSIS, ROUTINE W REFLEX MICROSCOPIC
Bilirubin Urine: NEGATIVE
Glucose, UA: NEGATIVE
Hgb urine dipstick: NEGATIVE
Ketones, ur: NEGATIVE
Leukocytes, UA: NEGATIVE
Nitrite: NEGATIVE
Protein, ur: NEGATIVE
SPECIFIC GRAVITY, URINE: 1.021 (ref 1.001–1.03)
pH: 6 (ref 5.0–8.0)

## 2018-08-02 LAB — URINE CULTURE
MICRO NUMBER:: 115304
Result:: NO GROWTH
SPECIMEN QUALITY: ADEQUATE

## 2018-08-02 NOTE — Telephone Encounter (Signed)
Noted  TMS 

## 2018-08-02 NOTE — Telephone Encounter (Signed)
Copied from CRM [id number]. 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 They need to know when he was advised out of work, treatment plan, and when he was declared fit for duty rto eturn. [id number] Claim #[id number]      [phone]    Please call them to confirm receipt of faxed forms.

## 2018-08-02 NOTE — Progress Notes (Signed)
Response from [facility]:  "CALLED AND LVM ON 01-21 AT 12:47 FOR PATIENT TO CALL BACK AND SCHEDULE HIS REFERRAL APPT FOR BACK PAIN. WILL TRY AGAIN NEXT WEEK." [facility] said 8 days ago  "CALLED A 2ND TIME AND LVM ON 01-29 AT 2:09 FOR PATIENT TO CALL BACK AND SCHEDULE HIS REFERRAL APPT FOR BACK PAIN. WILL TRY AGAIN NEXT WEEK. " [facility] said about 1 hour ago

## 2018-08-02 NOTE — Progress Notes (Signed)
Patient not in [facility]. 

## 2018-08-08 ENCOUNTER — Ambulatory Visit: Payer: Self-pay | Admitting: *Deleted

## 2018-08-08 NOTE — Telephone Encounter (Signed)
Summary: Shingles   Pt stated his Shingles has not improved even after taking and finishing his valACYclovir (VALTREX) 1000 MG tablet. He would like to know what to do next as he is having discomfort. Please advise. CB# [id number]     Patient is calling to report that he is still having significant pain in his skin and the rash is still active in the front chest area. Patient states the back is clearing up. Patient is calling to see if there is anything else he can be given for his pain- what he is taking is really not helping and he is not able to sleep without pain.  Patient states he is to return to work tomorrow- but does not think he can return. He is requesting an extension letter for work too. Will send message to PCP- since she is aware of shingles outbreak and has seen area- will send request in note before making appointment.  Reason for Disposition . [1] Shingles rash already diagnosed and [2] taking antiviral medication  Answer Assessment - Initial Assessment Questions 1. APPEARANCE of RASH: "Describe the rash."      Active blisters on front and under arms- R side 2. LOCATION: "Where is the rash located?"      R side- back is healing 3. ONSET: "When did the rash start?"      2 weeks 4. ITCHING: "Does the rash itch?" If so, ask: "How bad is the itch?"  (Scale 1-10; or mild, moderate, severe)     Yes- moderate 5. PAIN: "Does the rash hurt?" If so, ask: "How bad is the pain?"  (Scale 1-10; or mild, moderate, severe)     Pain under shoulder blades on back, and pain where active in front- 9 6. OTHER SYMPTOMS: "Do you have any other symptoms?" (e.g., fever)     No fever 7. PREGNANCY: "Is there any chance you are pregnant?" "When was your last menstrual period?"     n/a  Protocols used: [facility]

## 2018-08-09 ENCOUNTER — Encounter: Payer: Self-pay | Admitting: Internal Medicine

## 2018-08-09 ENCOUNTER — Ambulatory Visit: Payer: BLUE CROSS/BLUE SHIELD | Admitting: Internal Medicine

## 2018-08-09 VITALS — BP 146/80 | HR 117 | Temp 98.3°F | Ht 79.0 in | Wt 227.4 lb

## 2018-08-09 DIAGNOSIS — R000 Tachycardia, unspecified: Secondary | ICD-10-CM

## 2018-08-09 DIAGNOSIS — M5416 Radiculopathy, lumbar region: Secondary | ICD-10-CM

## 2018-08-09 DIAGNOSIS — I10 Essential (primary) hypertension: Secondary | ICD-10-CM

## 2018-08-09 DIAGNOSIS — B0229 Other postherpetic nervous system involvement: Secondary | ICD-10-CM

## 2018-08-09 DIAGNOSIS — B029 Zoster without complications: Secondary | ICD-10-CM

## 2018-08-09 MED ORDER — GABAPENTIN 300 MG PO CAPS: 600.0000 mg | ORAL_CAPSULE | Freq: Every day | ORAL | 3 refills | Status: DC

## 2018-08-09 MED ORDER — OXYCODONE-ACETAMINOPHEN 7.5-325 MG PO TABS: 1.0000 | ORAL_TABLET | Freq: Two times a day (BID) | ORAL | 0 refills | Status: DC | PRN

## 2018-08-09 NOTE — Progress Notes (Signed)
Chief Complaint  Patient presents with  . Follow-up   F/u shingles ans postherpatic neuralagia 1. Shingles rash resolving though ulcerations not completely resolved better than last week. He c/o 10/10 pain at times worse at night burning today 8-9/10 pain burning right chest and right flank/underarm and back pain. He is unable to work due to sx's. Tried gabapentin 300 mg qhs w/o relief, percocet 5-325 bid prn not much relief and lidocaine patches and lotion still burnign pain.  2. HTN elevated today had meds could be 2/2 pain   Review of Systems  Constitutional: Negative for weight loss.  HENT: Negative for hearing loss.   Eyes: Negative for blurred vision.  Respiratory: Negative for shortness of breath.   Cardiovascular: Negative for chest pain.  Gastrointestinal: Negative for abdominal pain.  Musculoskeletal: Negative for joint pain.  Skin: Positive for rash.  Neurological: Positive for sensory change.  Psychiatric/Behavioral: Negative for depression and memory loss.   Past Medical History:  Diagnosis Date  . Allergy   . Arthritis   . Carpal tunnel syndrome on left   . CTS (carpal tunnel syndrome)    left  . DDD (degenerative disc disease), thoracolumbar    unknown location   . DDD (degenerative disc disease), thoracolumbar    multilevel  . Degenerative joint disease of left shoulder   . Degenerative joint disease of left shoulder    02/2011   . Diverticulosis    left colon (2008)   . ED (erectile dysfunction)    02/2011   . ED (erectile dysfunction)   . GERD (gastroesophageal reflux disease)   . H. pylori infection   . H. pylori infection   . Hyperlipidemia   . Hypertension   . Insomnia   . Lactose intolerance   . Lactose intolerance   . Low back pain   . Shingles    07/2018  . Smoking    smoking since age 74 y.o  . Toe fracture, left    4th in 2014  . Vitamin D deficiency   . Vitamin D deficiency    Past Surgical History:  Procedure Laterality Date  .  APPENDECTOMY    . BACK SURGERY     x 2 10991 and 1995 in [place]   . CARDIAC CATHETERIZATION     1999 nl  . COLONOSCOPY     2008 diverticulosis  . ESOPHAGOGASTRODUODENOSCOPY     h/o +h. pylori  . OTHER SURGICAL HISTORY     heart catherization 1999 normal   . SPINE SURGERY     x 2    Family History  Problem Relation Age of Onset  . Heart disease Mother        age 76   . Throat cancer Father 83  . Cancer Father        throat   . Cancer Maternal Uncle        uncle ?maternal or paternal died colon cancer in his 3s   . Diabetes Maternal Aunt    Social History   Socioeconomic History  . Marital status: Married    Spouse name: Not on file  . Number of children: Not on file  . Years of education: Not on file  . Highest education level: Not on file  Occupational History  . Not on file  Social Needs  . Financial resource strain: Not on file  . Food insecurity:    Worry: Not on file    Inability: Not on file  . Transportation needs:  Medical: Not on file    Non-medical: Not on file  Tobacco Use  . Smoking status: Current Every Day Smoker    Packs/day: 0.50    Start date: 07/06/1971  . Smokeless tobacco: Never Used  . Tobacco comment: cutting back  Substance and Sexual Activity  . Alcohol use: No    Alcohol/week: 0.0 standard drinks  . Drug use: No  . Sexual activity: Not on file  Lifestyle  . Physical activity:    Days per week: Not on file    Minutes per session: Not on file  . Stress: Not on file  Relationships  . Social connections:    Talks on phone: Not on file    Gets together: Not on file    Attends religious service: Not on file    Active member of club or organization: Not on file    Attends meetings of clubs or organizations: Not on file    Relationship status: Not on file  . Intimate partner violence:    Fear of current or ex partner: Not on file    Emotionally abused: Not on file    Physically abused: Not on file    Forced sexual activity: Not on  file  Other Topics Concern  . 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 chlorthalidone (HYGROTON) 25 MG tablet Take 1 tablet (25 mg total) by mouth daily.  Marland Kitchen losartan (COZAAR) 25 MG tablet Take 1 tablet (25 mg total) by mouth daily. In am   Allergies  Allergen Reactions  . Crestor [Rosuvastatin Calcium]     Elevated muscle enzymes   . Lipitor [Atorvastatin Calcium]     Elevated muscle enzymes   . Tomato Itching   Recent Results (from the past 2160 hour(s))  Urinalysis, Routine w reflex microscopic     Status: None   Collection Time: 08/01/18 11:57 AM  Result Value Ref Range   Color, Urine YELLOW YELLOW   APPearance CLEAR CLEAR   Specific Gravity, Urine 1.021 1.001 - 1.03   pH 6.0 5.0 - 8.0   Glucose, UA NEGATIVE NEGATIVE   Bilirubin Urine NEGATIVE NEGATIVE   Ketones, ur NEGATIVE NEGATIVE   Hgb urine dipstick NEGATIVE NEGATIVE   Protein, ur NEGATIVE NEGATIVE   Nitrite NEGATIVE NEGATIVE   Leukocytes, UA NEGATIVE NEGATIVE  Urine Culture     Status: None   Collection Time: 08/01/18 11:57 AM  Result Value Ref Range   MICRO NUMBER: [id number]    SPECIMEN QUALITY: Adequate    Sample Source NOT GIVEN    STATUS: FINAL    Result: No Growth    Objective  Body mass index is 25.62 kg/m. Wt Readings from Last 3 Encounters:  08/09/18 227 lb 6.4 oz (103.1 kg)  08/01/18 227 lb 12.8 oz (103.3 kg)  06/29/18 229 lb 9.6 oz (104.1 kg)   Temp Readings from Last 3 Encounters:  08/09/18 98.3 F (36.8 C) (Oral)  08/01/18 98.2 F (36.8 C) (Oral)  06/29/18 98.1 F (36.7 C) (Oral)   BP Readings from Last 3 Encounters:  08/09/18 (!) 146/80  08/01/18 132/76  06/29/18 140/88   Pulse Readings from Last 3 Encounters:  08/09/18 (!) 117  08/01/18 86  06/29/18 64    Physical Exam Vitals signs and nursing  note reviewed.  Constitutional:       Appearance: Normal appearance. He is well-developed and well-groomed.  HENT:     Head: Normocephalic and atraumatic.     Nose: Nose normal.     Mouth/Throat:     Mouth: Mucous membranes are moist.     Pharynx: Oropharynx is clear.  Eyes:     Conjunctiva/sclera: Conjunctivae normal.     Pupils: Pupils are equal, round, and reactive to light.  Cardiovascular:     Rate and Rhythm: Regular rhythm. Tachycardia present.     Heart sounds: Normal heart sounds. No murmur.  Pulmonary:     Effort: Pulmonary effort is normal.     Breath sounds: Normal breath sounds.  Skin:    General: Skin is warm and dry.       Neurological:     General: No focal deficit present.     Mental Status: He is alert and oriented to person, place, and time. Mental status is at baseline.     Gait: Gait normal.  Psychiatric:        Attention and Perception: Attention and perception normal.        Mood and Affect: Mood and affect normal.        Speech: Speech normal.        Behavior: Behavior normal. Behavior is cooperative.        Thought Content: Thought content normal.        Cognition and Memory: Cognition and memory normal.        Judgment: Judgment normal.     Assessment   1. Shingles with postherpetic neuralgia  2. htn elevated likely 2/2 #1 had meds today  3. HM Plan  1. Completed valtrex  Increase percocet to 7.5-325 mg bid prn  Increase gabapentin to 600 mg qhs  Note to be off until 08/21/2018  Lidocaine patches otc 2. Cont meds  3.  Flu shot ? If had this year last [facility] 04/2018 may have had there check ncir likely had 04/2018  prevnar and pna 23 utd11/30/17 and 02/16/15 Tdap had 06/15/13  shingrix consider in 3 months active shingle snow  Hep B immune  PSA 1.9 07/20/17 with BPH 08/2017 urology Dr. Bernardo Heater  Hep C neg 06/03/16 Check MMR  Colonoscopy 11/29/06 [facility] -had since signed release for records today  Tobacco  abuse long term-rec smoking cessation1/2 ppd since age 57 y.o max 1 ppd Provider: Dr. Olivia Mackie [name]-[facility] Medicine

## 2018-08-09 NOTE — Telephone Encounter (Signed)
We can extend his work leave, does he want to do that?  Also we can try increasing gabapentin or continuing pain medication  With otc lidocaine patches  How does he want to proceed?   TMS

## 2018-08-09 NOTE — Patient Instructions (Signed)
Postherpetic Neuralgia  Postherpetic neuralgia (PHN) is nerve pain that occurs after a shingles infection. Shingles is a painful rash that appears on one area of the body, usually on the trunk or face. Shingles is caused by the varicella-zoster virus. This is the same virus that causes chickenpox. In people who have had chickenpox, the virus can resurface years later and cause shingles.  You may have PHN if you continue to have pain for 4 months after your shingles rash has gone away. PHN appears in the same area where you had the shingles rash. The pain usually goes away after the rash disappears.  Getting a vaccination for shingles can prevent PHN. This vaccine is recommended for people older than 60. It may prevent shingles, and may also lower your risk of PHN if you do get shingles.  What are the causes?  This condition is caused by damage to your nerves from the varicella-zoster virus. The damage makes your nerves overly sensitive.  What increases the risk?  The following factors may make you more likely to develop this condition:   Being older than 69 years of age.   Having severe pain before your shingles rash starts.   Having a severe rash.   Having shingles in and around the eye area.   Having a disease that makes your body unable to fight infections (weak immune system).  What are the signs or symptoms?  The main symptom of this condition is pain. The pain may:   Often be very bad and may be described as stabbing, burning, or feeling like an electric shock.   Come and go or may be there all the time.   Be triggered by light touches on the skin or changes in temperature.  You may have itching along with the pain.  How is this diagnosed?  This condition may be diagnosed based on your symptoms and your history of shingles. Lab studies and other diagnostic tests are usually not needed.  How is this treated?  There is no cure for this condition. Treatment for PHN will focus on pain relief.  Over-the-counter pain relievers do not usually relieve PHN pain. You may need to work with a pain specialist. Treatment may include:   Antidepressant medicines to help with pain and improve sleep.   Anti-seizure medicines to relieve nerve pain.   Strong pain relievers (opioids).   A numbing patch worn on the skin (lidocaine patch).   Botox (botulinum toxin) injections to block pain signals between nerves and muscles.   Injections of numbing medicine or anti-inflammatory medicines around irritated nerves.  Follow these instructions at home:     It may take a long time to recover from PHN. Work closely with your health care provider and develop a good support system at home.   Take over-the-counter and prescription medicines only as told by your health care provider.   Do not drive or use heavy machinery while taking prescription pain medicine.   Wear loose, comfortable clothing.   Cover sensitive areas with a dressing to reduce friction from clothing rubbing on the area.   If directed, put ice on the painful area:  ? Put ice in a plastic bag.  ? Place a towel between your skin and the bag.  ? Leave the ice on for 20 minutes, 2-3 times a day.   Talk to your health care provider if you feel depressed or desperate. Living with long-term pain can be depressing.   Keep all follow-up visits   as told by your health care provider. This is important.  Contact a health care provider if:   Your medicine is not helping.   You are struggling to manage your pain at home.  Summary   Postherpetic neuralgia is a very painful disorder that can occur after an episode of shingles.   The pain is often severe, burning, electric, or stabbing.   Prescription medicines can be helpful in managing persistent pain.   Getting a vaccination for shingles can prevent PHN. This vaccine is recommended for people older than 60.  This information is not intended to replace advice given to you by your health care provider. Make sure  you discuss any questions you have with your health care provider.  Document Released: 09/11/2002 Document Revised: 09/07/2016 Document Reviewed: 09/07/2016  Elsevier Interactive Patient Education  2019 [employer]

## 2018-08-09 NOTE — Progress Notes (Signed)
Pre visit review using our clinic review tool, if applicable. No additional management support is needed unless otherwise documented below in the visit note. 

## 2018-08-09 NOTE — Telephone Encounter (Signed)
Patient has appointment today

## 2018-08-15 ENCOUNTER — Telehealth: Payer: Self-pay | Admitting: Internal Medicine

## 2018-08-15 ENCOUNTER — Ambulatory Visit: Payer: Medicare Other | Admitting: Urology

## 2018-08-15 NOTE — Telephone Encounter (Signed)
Pt drop off FMLA paperwork that needs to be filled out.  It's in color folder up front. Thank you!

## 2018-08-17 ENCOUNTER — Ambulatory Visit: Payer: BLUE CROSS/BLUE SHIELD | Admitting: Internal Medicine

## 2018-08-17 ENCOUNTER — Encounter: Payer: Self-pay | Admitting: Internal Medicine

## 2018-08-17 VITALS — BP 144/76 | HR 105 | Temp 98.7°F | Ht 79.0 in | Wt 227.8 lb

## 2018-08-17 DIAGNOSIS — M5416 Radiculopathy, lumbar region: Secondary | ICD-10-CM | POA: Diagnosis not present

## 2018-08-17 DIAGNOSIS — B0229 Other postherpetic nervous system involvement: Secondary | ICD-10-CM

## 2018-08-17 DIAGNOSIS — I10 Essential (primary) hypertension: Secondary | ICD-10-CM

## 2018-08-17 DIAGNOSIS — B029 Zoster without complications: Secondary | ICD-10-CM

## 2018-08-17 MED ORDER — VALACYCLOVIR HCL 1 G PO TABS: 1000.0000 mg | ORAL_TABLET | Freq: Three times a day (TID) | ORAL | 0 refills | Status: DC

## 2018-08-17 MED ORDER — GABAPENTIN 300 MG PO CAPS: 900.0000 mg | ORAL_CAPSULE | Freq: Every day | ORAL | 3 refills | Status: DC

## 2018-08-17 MED ORDER — AMITRIPTYLINE HCL 10 MG PO TABS: 10.0000 mg | ORAL_TABLET | Freq: Every day | ORAL | 0 refills | Status: DC

## 2018-08-17 MED ORDER — LOSARTAN POTASSIUM 50 MG PO TABS: 50.0000 mg | ORAL_TABLET | Freq: Every day | ORAL | 3 refills | Status: DC

## 2018-08-17 MED ORDER — OXYCODONE-ACETAMINOPHEN 7.5-325 MG PO TABS: 1.0000 | ORAL_TABLET | Freq: Two times a day (BID) | ORAL | 0 refills | Status: DC | PRN

## 2018-08-17 NOTE — Patient Instructions (Signed)
Call me back Tuesday next week to let me know how you are doing  Capsaicin cream over the counter spot treat use gloves   Add amitriptyline 10 mg at night with gabapentin 900 mg at night  Take more pain pills and antiviral pills   Increase losartan to 50 mg in the daily -blood pressure medication    Postherpetic Neuralgia Postherpetic neuralgia (PHN) is nerve pain that occurs after a shingles infection. Shingles is a painful rash that appears on one area of the body, usually on the trunk or face. Shingles is caused by the varicella-zoster virus. This is the same virus that causes chickenpox. In people who have had chickenpox, the virus can resurface years later and cause shingles. You may have PHN if you continue to have pain for 4 months after your shingles rash has gone away. PHN appears in the same area where you had the shingles rash. The pain usually goes away after the rash disappears. Getting a vaccination for shingles can prevent PHN. This vaccine is recommended for people older than 60. It may prevent shingles, and may also lower your risk of PHN if you do get shingles. What are the causes? This condition is caused by damage to your nerves from the varicella-zoster virus. The damage makes your nerves overly sensitive. What increases the risk? The following factors may make you more likely to develop this condition:  Being older than 69 years of age.  Having severe pain before your shingles rash starts.  Having a severe rash.  Having shingles in and around the eye area.  Having a disease that makes your body unable to fight infections (weak immune system). What are the signs or symptoms? The main symptom of this condition is pain. The pain may:  Often be very bad and may be described as stabbing, burning, or feeling like an electric shock.  Come and go or may be there all the time.  Be triggered by light touches on the skin or changes in temperature. You may have itching  along with the pain. How is this diagnosed? This condition may be diagnosed based on your symptoms and your history of shingles. Lab studies and other diagnostic tests are usually not needed. How is this treated? There is no cure for this condition. Treatment for PHN will focus on pain relief. Over-the-counter pain relievers do not usually relieve PHN pain. You may need to work with a pain specialist. Treatment may include:  Antidepressant medicines to help with pain and improve sleep.  Anti-seizure medicines to relieve nerve pain.  Strong pain relievers (opioids).  A numbing patch worn on the skin (lidocaine patch).  Botox (botulinum toxin) injections to block pain signals between nerves and muscles.  Injections of numbing medicine or anti-inflammatory medicines around irritated nerves. Follow these instructions at home:   It may take a long time to recover from PHN. Work closely with your health care provider and develop a good support system at home.  Take over-the-counter and prescription medicines only as told by your health care provider.  Do not drive or use heavy machinery while taking prescription pain medicine.  Wear loose, comfortable clothing.  Cover sensitive areas with a dressing to reduce friction from clothing rubbing on the area.  If directed, put ice on the painful area: ? Put ice in a plastic bag. ? Place a towel between your skin and the bag. ? Leave the ice on for 20 minutes, 2-3 times a day.  Talk to your  health care provider if you feel depressed or desperate. Living with long-term pain can be depressing.  Keep all follow-up visits as told by your health care provider. This is important. Contact a health care provider if:  Your medicine is not helping.  You are struggling to manage your pain at home. Summary  Postherpetic neuralgia is a very painful disorder that can occur after an episode of shingles.  The pain is often severe, burning, electric,  or stabbing.  Prescription medicines can be helpful in managing persistent pain.  Getting a vaccination for shingles can prevent PHN. This vaccine is recommended for people older than 60. This information is not intended to replace advice given to you by your health care provider. Make sure you discuss any questions you have with your health care provider. Document Released: 09/11/2002 Document Revised: 09/07/2016 Document Reviewed: 09/07/2016 Elsevier Interactive Patient Education  2019 [employer].

## 2018-08-17 NOTE — Progress Notes (Signed)
Chief Complaint  Patient presents with  . Follow-up   F/u shingles  1. HTN uncontrolled had meds today chlorthalidone 25 and losartan 25 mg qd  2. postherpatic neuralgia still painful on the right side of chest,flank, abdomen, back feels raw and inflammed does not have any more pain pills pain 8/10. He takes gabapentin 600 mg qhs and still burning sensation had reduced appetite since shingles started no dysphagia. He does not think he will be able to go back to work on 08/21/2018. Rash is improving     Review of Systems  Constitutional: Negative for weight loss.  HENT: Negative for hearing loss.   Eyes: Negative for blurred vision.  Respiratory: Negative for shortness of breath.   Cardiovascular: Negative for chest pain.  Gastrointestinal: Negative for abdominal pain.  Musculoskeletal: Negative for falls.  Skin: Negative for rash.  Neurological: Negative for headaches.  Psychiatric/Behavioral: Negative for depression.   Past Medical History:  Diagnosis Date  . Allergy   . Arthritis   . Carpal tunnel syndrome on left   . CTS (carpal tunnel syndrome)    left  . DDD (degenerative disc disease), thoracolumbar    unknown location   . DDD (degenerative disc disease), thoracolumbar    multilevel  . Degenerative joint disease of left shoulder   . Degenerative joint disease of left shoulder    02/2011   . Diverticulosis    left colon (2008)   . ED (erectile dysfunction)    02/2011   . ED (erectile dysfunction)   . GERD (gastroesophageal reflux disease)   . H. pylori infection   . H. pylori infection   . Hyperlipidemia   . Hypertension   . Insomnia   . Lactose intolerance   . Lactose intolerance   . Low back pain   . Shingles    07/2018  . Smoking    smoking since age 29 y.o  . Toe fracture, left    4th in 2014  . Vitamin D deficiency   . Vitamin D deficiency    Past Surgical History:  Procedure Laterality Date  . APPENDECTOMY    . BACK SURGERY     x 2 10991 and 1995  in [place]   . CARDIAC CATHETERIZATION     1999 nl  . COLONOSCOPY     2008 diverticulosis  . ESOPHAGOGASTRODUODENOSCOPY     h/o +h. pylori  . OTHER SURGICAL HISTORY     heart catherization 1999 normal   . SPINE SURGERY     x 2    Family History  Problem Relation Age of Onset  . Heart disease Mother        age 9   . Throat cancer Father 36  . Cancer Father        throat   . Cancer Maternal Uncle        uncle ?maternal or paternal died colon cancer in his 23s   . Diabetes Maternal Aunt    Social History   Socioeconomic History  . Marital status: Married    Spouse name: Not on file  . Number of children: Not on file  . Years of education: Not on file  . Highest education level: Not on file  Occupational History  . Not on file  Social Needs  . Financial resource strain: Not on file  . Food insecurity:    Worry: Not on file    Inability: Not on file  . Transportation needs:    Medical: Not on  file    Non-medical: Not on file  Tobacco Use  . Smoking status: Current Every Day Smoker    Packs/day: 0.50    Start date: 07/06/1971  . Smokeless tobacco: Never Used  . Tobacco comment: cutting back  Substance and Sexual Activity  . Alcohol use: No    Alcohol/week: 0.0 standard drinks  . Drug use: No  . Sexual activity: Not on file  Lifestyle  . Physical activity:    Days per week: Not on file    Minutes per session: Not on file  . Stress: Not on file  Relationships  . Social connections:    Talks on phone: Not on file    Gets together: Not on file    Attends religious service: Not on file    Active member of club or organization: Not on file    Attends meetings of clubs or organizations: Not on file    Relationship status: Not on file  . Intimate partner violence:    Fear of current or ex partner: Not on file    Emotionally abused: Not on file    Physically abused: Not on file    Forced sexual activity: Not on file  Other Topics Concern  . 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   Allergies  Allergen Reactions  . Crestor [Rosuvastatin Calcium]     Elevated muscle enzymes   . Lipitor [Atorvastatin Calcium]     Elevated muscle enzymes   . Tomato Itching   Recent Results (from the past 2160 hour(s))  Urinalysis, Routine w reflex microscopic     Status: None   Collection Time: 08/01/18 11:57 AM  Result Value Ref Range   Color, Urine YELLOW YELLOW   APPearance CLEAR CLEAR   Specific Gravity, Urine 1.021 1.001 - 1.03   pH 6.0 5.0 - 8.0   Glucose, UA NEGATIVE NEGATIVE   Bilirubin Urine NEGATIVE NEGATIVE   Ketones, ur NEGATIVE NEGATIVE   Hgb urine dipstick NEGATIVE NEGATIVE   Protein, ur NEGATIVE NEGATIVE   Nitrite NEGATIVE NEGATIVE   Leukocytes, UA NEGATIVE NEGATIVE  Urine Culture     Status: None   Collection Time: 08/01/18 11:57 AM  Result Value Ref Range   MICRO NUMBER: [id number]    SPECIMEN QUALITY: Adequate    Sample Source NOT GIVEN    STATUS: FINAL    Result: No Growth    Objective  Body mass index is 25.66 kg/m. Wt Readings from Last 3 Encounters:  08/17/18 227 lb 12.8 oz (103.3 kg)  08/09/18 227 lb 6.4 oz (103.1 kg)  08/01/18 227 lb 12.8 oz (103.3 kg)   Temp Readings from Last 3 Encounters:  08/17/18 98.7 F (37.1 C) (Oral)  08/09/18 98.3 F (36.8 C) (Oral)  08/01/18 98.2 F (36.8 C) (Oral)   BP Readings from Last 3 Encounters:  08/17/18 (!) 144/76  08/09/18 (!) 146/80  08/01/18 132/76   Pulse Readings from Last 3 Encounters:  08/17/18 (!) 105  08/09/18 (!) 117  08/01/18 86    Physical Exam Vitals signs and nursing note reviewed.  Constitutional:      Appearance: Normal appearance. He is well-developed and well-groomed.  HENT:  Head: Normocephalic and atraumatic.      Nose: Nose normal.     Mouth/Throat:     Mouth: Mucous membranes are moist.     Pharynx: Oropharynx is clear.  Eyes:     Conjunctiva/sclera: Conjunctivae normal.     Pupils: Pupils are equal, round, and reactive to light.  Cardiovascular:     Rate and Rhythm: Normal rate and regular rhythm.     Heart sounds: Normal heart sounds.  Pulmonary:     Effort: Pulmonary effort is normal.     Breath sounds: Normal breath sounds.  Skin:    General: Skin is warm and dry.       Neurological:     General: No focal deficit present.     Mental Status: He is alert and oriented to person, place, and time. Mental status is at baseline.     Gait: Gait normal.  Psychiatric:        Attention and Perception: Attention and perception normal.        Mood and Affect: Mood and affect normal.        Speech: Speech normal.        Behavior: Behavior normal. Behavior is cooperative.        Thought Content: Thought content normal.        Cognition and Memory: Cognition and memory normal.        Judgment: Judgment normal.     Assessment   1. HTN  2. postherpatic neuralgia  3. HM Plan   1. Increase losartan 50 cont chlorthalidone  2. FMLA extend to be out of work until 09/04/2018 hopefully sxs will be better then  Add amitriptyline 10 mg qhs  Gabapentin increase 600 to 900 mg qhs  Percocet again  Valtrex 3rd round total 3 weeks work  3.  Flu shot? If had this year [facility] 04/2018 may have had there check ncirlikely had 04/2018  prevnar and pna 23 utd11/30/17 and 02/16/15 Tdap had 06/15/13  shingrix considerin 3 months active [place] Hep B immune  PSA 1.9 07/20/17 with BPH2/2019 urology Dr. Bernardo Heater Hep C neg 06/03/16 Check MMR Check fasting labs orders in   Colonoscopy 11/29/06 [facility] -had since signed release for records today Tobacco abuse long term-rec smoking cessation1/2 ppd since age 13 y.o max 1  ppd  Provider: Dr. Olivia Mackie [name]-Internal Medicine

## 2018-08-17 NOTE — Progress Notes (Signed)
Pre visit review using our clinic review tool, if applicable. No additional management support is needed unless otherwise documented below in the visit note. 

## 2018-08-30 ENCOUNTER — Encounter: Payer: Self-pay | Admitting: Internal Medicine

## 2018-08-30 ENCOUNTER — Other Ambulatory Visit: Payer: Self-pay | Admitting: Internal Medicine

## 2018-08-30 ENCOUNTER — Ambulatory Visit: Payer: BLUE CROSS/BLUE SHIELD | Admitting: Internal Medicine

## 2018-08-30 VITALS — BP 120/84 | HR 103 | Temp 98.5°F | Ht 79.0 in | Wt 226.8 lb

## 2018-08-30 DIAGNOSIS — B0229 Other postherpetic nervous system involvement: Secondary | ICD-10-CM | POA: Diagnosis not present

## 2018-08-30 DIAGNOSIS — I10 Essential (primary) hypertension: Secondary | ICD-10-CM | POA: Diagnosis not present

## 2018-08-30 DIAGNOSIS — M5416 Radiculopathy, lumbar region: Secondary | ICD-10-CM | POA: Diagnosis not present

## 2018-08-30 DIAGNOSIS — B029 Zoster without complications: Secondary | ICD-10-CM

## 2018-08-30 MED ORDER — OXYCODONE-ACETAMINOPHEN 7.5-325 MG PO TABS: 1.0000 | ORAL_TABLET | Freq: Two times a day (BID) | ORAL | 0 refills | Status: DC | PRN

## 2018-08-30 MED ORDER — AMITRIPTYLINE HCL 25 MG PO TABS: 25.0000 mg | ORAL_TABLET | Freq: Every day | ORAL | 1 refills | Status: DC

## 2018-08-30 NOTE — Progress Notes (Signed)
Chief Complaint  Patient presents with  . Follow-up   F/u with wife  1. Postherpetic neuralgia with burning/crawling sensation still present right axilla and right chest upper back 8/10 sx's started 07/2018. Rash is resolved on elavil 10 mg qhs out of valtrex and percocet. Pain worse at night. On gabapentin 900 mg qhs. He also reports since shingles onset reduced appetite and taste but no dysphagia. He was initially given work off until 09/04/2018 but now wants to try to go back 09/11/2018   2. HTN improved on chlorthalidone 25 mg qhs and losartan 50 mg qd    Review of Systems  Constitutional: Negative for weight loss.  HENT: Negative for hearing loss.   Eyes: Negative for blurred vision.  Respiratory: Negative for shortness of breath.   Cardiovascular: Negative for chest pain.  Gastrointestinal: Negative for abdominal pain.       Reduced appetite and reduced taste    Musculoskeletal: Negative for falls.  Skin: Negative for rash.  Neurological: Positive for sensory change.  Psychiatric/Behavioral: Negative for depression.   Past Medical History:  Diagnosis Date  . Allergy   . Arthritis   . Carpal tunnel syndrome on left   . CTS (carpal tunnel syndrome)    left  . DDD (degenerative disc disease), thoracolumbar    unknown location   . DDD (degenerative disc disease), thoracolumbar    multilevel  . Degenerative joint disease of left shoulder   . Degenerative joint disease of left shoulder    02/2011   . Diverticulosis    left colon (2008)   . ED (erectile dysfunction)    02/2011   . ED (erectile dysfunction)   . GERD (gastroesophageal reflux disease)   . H. pylori infection   . H. pylori infection   . Hyperlipidemia   . Hypertension   . Insomnia   . Lactose intolerance   . Lactose intolerance   . Low back pain   . Shingles    07/2018  . Smoking    smoking since age 23 y.o  . Toe fracture, left    4th in 2014  . Vitamin D deficiency   . Vitamin D deficiency    Past  Surgical History:  Procedure Laterality Date  . APPENDECTOMY    . BACK SURGERY     x 2 10991 and 1995 in [place]   . CARDIAC CATHETERIZATION     1999 nl  . COLONOSCOPY     2008 diverticulosis  . ESOPHAGOGASTRODUODENOSCOPY     h/o +h. pylori  . OTHER SURGICAL HISTORY     heart catherization 1999 normal   . SPINE SURGERY     x 2    Family History  Problem Relation Age of Onset  . Heart disease Mother        age 21   . Throat cancer Father 51  . Cancer Father        throat   . Cancer Maternal Uncle        uncle ?maternal or paternal died colon cancer in his 85s   . Diabetes Maternal Aunt    Social History   Socioeconomic History  . Marital status: Married    Spouse name: Not on file  . Number of children: Not on file  . Years of education: Not on file  . Highest education level: Not on file  Occupational History  . Not on file  Social Needs  . Financial resource strain: Not on file  . Food insecurity:  Worry: Not on file    Inability: Not on file  . Transportation needs:    Medical: Not on file    Non-medical: Not on file  Tobacco Use  . Smoking status: Current Every Day Smoker    Packs/day: 0.50    Start date: 07/06/1971  . Smokeless tobacco: Never Used  . Tobacco comment: cutting back  Substance and Sexual Activity  . Alcohol use: No    Alcohol/week: 0.0 standard drinks  . Drug use: No  . Sexual activity: Not on file  Lifestyle  . Physical activity:    Days per week: Not on file    Minutes per session: Not on file  . Stress: Not on file  Relationships  . Social connections:    Talks on phone: Not on file    Gets together: Not on file    Attends religious service: Not on file    Active member of club or organization: Not on file    Attends meetings of clubs or organizations: Not on file    Relationship status: Not on file  . Intimate partner violence:    Fear of current or ex partner: Not on file    Emotionally abused: Not on file    Physically  abused: Not on file    Forced sexual activity: Not on file  Other Topics Concern  . 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 amitriptyline (ELAVIL) 10 MG tablet Take 1 tablet (10 mg total) by mouth at bedtime.  . chlorthalidone (HYGROTON) 25 MG tablet Take 1 tablet (25 mg total) by mouth daily.  Marland Kitchen gabapentin (NEURONTIN) 300 MG capsule Take 3 capsules (900 mg total) by mouth at bedtime.  Marland Kitchen losartan (COZAAR) 50 MG tablet Take 1 tablet (50 mg total) by mouth daily. In am  . oxyCODONE-acetaminophen (PERCOCET) 7.5-325 MG tablet Take 1 tablet by mouth 2 (two) times daily as needed for severe pain.  . valACYclovir (VALTREX) 1000 MG tablet Take 1 tablet (1,000 mg total) by mouth 3 (three) times daily.   Allergies  Allergen Reactions  . Crestor [Rosuvastatin Calcium]     Elevated muscle enzymes   . Lipitor [Atorvastatin Calcium]     Elevated muscle enzymes   . Tomato Itching   Recent Results (from the past 2160 hour(s))  Urinalysis, Routine w reflex microscopic     Status: None   Collection Time: 08/01/18 11:57 AM  Result Value Ref Range   Color, Urine YELLOW YELLOW   APPearance CLEAR CLEAR   Specific Gravity, Urine 1.021 1.001 - 1.03   pH 6.0 5.0 - 8.0   Glucose, UA NEGATIVE NEGATIVE   Bilirubin Urine NEGATIVE NEGATIVE   Ketones, ur NEGATIVE NEGATIVE   Hgb urine dipstick NEGATIVE NEGATIVE   Protein, ur NEGATIVE NEGATIVE   Nitrite NEGATIVE NEGATIVE   Leukocytes, UA NEGATIVE NEGATIVE  Urine Culture     Status: None   Collection Time: 08/01/18 11:57 AM  Result Value Ref Range   MICRO NUMBER: [id number]    SPECIMEN QUALITY: Adequate    Sample Source NOT GIVEN    STATUS: FINAL    Result: No Growth    Objective  Body mass index is 25.55 kg/m. Wt Readings from Last 3 Encounters:  08/30/18 226 lb 12.8 oz (  102.9 kg)  08/17/18 227 lb 12.8 oz  (103.3 kg)  08/09/18 227 lb 6.4 oz (103.1 kg)   Temp Readings from Last 3 Encounters:  08/30/18 98.5 F (36.9 C) (Oral)  08/17/18 98.7 F (37.1 C) (Oral)  08/09/18 98.3 F (36.8 C) (Oral)   BP Readings from Last 3 Encounters:  08/30/18 120/84  08/17/18 (!) 144/76  08/09/18 (!) 146/80   Pulse Readings from Last 3 Encounters:  08/30/18 (!) 103  08/17/18 (!) 105  08/09/18 (!) 117    Physical Exam Vitals signs and nursing note reviewed.  Constitutional:      Appearance: Normal appearance. He is well-developed and well-groomed.  HENT:     Head: Normocephalic and atraumatic.     Nose: Nose normal.     Mouth/Throat:     Mouth: Mucous membranes are moist.     Pharynx: Oropharynx is clear.  Eyes:     Conjunctiva/sclera: Conjunctivae normal.     Pupils: Pupils are equal, round, and reactive to light.  Cardiovascular:     Rate and Rhythm: Regular rhythm. Tachycardia present.     Heart sounds: Normal heart sounds.  Pulmonary:     Effort: Pulmonary effort is normal.     Breath sounds: Normal breath sounds.  Skin:    General: Skin is warm and dry.  Neurological:     General: No focal deficit present.     Mental Status: He is alert and oriented to person, place, and time. Mental status is at baseline.     Gait: Gait normal.  Psychiatric:        Attention and Perception: Attention and perception normal.        Mood and Affect: Mood and affect normal.        Speech: Speech normal.        Behavior: Behavior normal. Behavior is cooperative.        Thought Content: Thought content normal.        Cognition and Memory: Cognition and memory normal.        Judgment: Judgment normal.     Assessment   1. Resolved shingles with postherpetic neurlagia still present 2.HTN improved  3. HM Plan   1. Increase amitriptyline 10 to 25 mg qhs  Last Rx percocet  Gabapentin 900 mg qhs  Off valtrex as rash resolved  Off work until 09/11/2018  2. Cont current meds  3.  Flu shot? If had  this year [facility] 04[facility]2018 may have had there check ncirlikely had 04/2018 at [facility] when wife had hers  prevnar and pna 23 utd11/30/17 and 02/16/15 Tdap had 06/15/13  shingrix consider at visit 10/2018 if should get currently dealing with #1   Hep B immune  PSA 1.9 07/20/17 with BPH2/2019 urology Dr. Bernardo Heater Hep C neg 06/03/16 D/c MMR check 2/2 cost  Check fasting labs orders in   Colonoscopy 11/29/06 [facility] -had since signed release for records prev Tobacco abuse long term-rec smoking cessation1/2 ppd since age 11 y.o max 1 ppd  Provider: Dr. Olivia Mackie [name]-Internal Medicine

## 2018-08-30 NOTE — Addendum Note (Signed)
Addended by: Penne Lash on: 08/30/2018 12:53 PM   Modules accepted: Orders

## 2018-08-30 NOTE — Patient Instructions (Signed)
Postherpetic Neuralgia  Postherpetic neuralgia (PHN) is nerve pain that occurs after a shingles infection. Shingles is a painful rash that appears on one area of the body, usually on the trunk or face. Shingles is caused by the varicella-zoster virus. This is the same virus that causes chickenpox. In people who have had chickenpox, the virus can resurface years later and cause shingles.  You may have PHN if you continue to have pain for 4 months after your shingles rash has gone away. PHN appears in the same area where you had the shingles rash. The pain usually goes away after the rash disappears.  Getting a vaccination for shingles can prevent PHN. This vaccine is recommended for people older than 60. It may prevent shingles, and may also lower your risk of PHN if you do get shingles.  What are the causes?  This condition is caused by damage to your nerves from the varicella-zoster virus. The damage makes your nerves overly sensitive.  What increases the risk?  The following factors may make you more likely to develop this condition:   Being older than 69 years of age.   Having severe pain before your shingles rash starts.   Having a severe rash.   Having shingles in and around the eye area.   Having a disease that makes your body unable to fight infections (weak immune system).  What are the signs or symptoms?  The main symptom of this condition is pain. The pain may:   Often be very bad and may be described as stabbing, burning, or feeling like an electric shock.   Come and go or may be there all the time.   Be triggered by light touches on the skin or changes in temperature.  You may have itching along with the pain.  How is this diagnosed?  This condition may be diagnosed based on your symptoms and your history of shingles. Lab studies and other diagnostic tests are usually not needed.  How is this treated?  There is no cure for this condition. Treatment for PHN will focus on pain relief.  Over-the-counter pain relievers do not usually relieve PHN pain. You may need to work with a pain specialist. Treatment may include:   Antidepressant medicines to help with pain and improve sleep.   Anti-seizure medicines to relieve nerve pain.   Strong pain relievers (opioids).   A numbing patch worn on the skin (lidocaine patch).   Botox (botulinum toxin) injections to block pain signals between nerves and muscles.   Injections of numbing medicine or anti-inflammatory medicines around irritated nerves.  Follow these instructions at home:     It may take a long time to recover from PHN. Work closely with your health care provider and develop a good support system at home.   Take over-the-counter and prescription medicines only as told by your health care provider.   Do not drive or use heavy machinery while taking prescription pain medicine.   Wear loose, comfortable clothing.   Cover sensitive areas with a dressing to reduce friction from clothing rubbing on the area.   If directed, put ice on the painful area:  ? Put ice in a plastic bag.  ? Place a towel between your skin and the bag.  ? Leave the ice on for 20 minutes, 2-3 times a day.   Talk to your health care provider if you feel depressed or desperate. Living with long-term pain can be depressing.   Keep all follow-up visits   as told by your health care provider. This is important.  Contact a health care provider if:   Your medicine is not helping.   You are struggling to manage your pain at home.  Summary   Postherpetic neuralgia is a very painful disorder that can occur after an episode of shingles.   The pain is often severe, burning, electric, or stabbing.   Prescription medicines can be helpful in managing persistent pain.   Getting a vaccination for shingles can prevent PHN. This vaccine is recommended for people older than 60.  This information is not intended to replace advice given to you by your health care provider. Make sure  you discuss any questions you have with your health care provider.  Document Released: 09/11/2002 Document Revised: 09/07/2016 Document Reviewed: 09/07/2016  Elsevier Interactive Patient Education  2019 [employer]

## 2018-08-30 NOTE — Progress Notes (Signed)
Pre visit review using our clinic review tool, if applicable. No additional management support is needed unless otherwise documented below in the visit note. 

## 2018-09-05 ENCOUNTER — Encounter: Payer: Self-pay | Admitting: Internal Medicine

## 2018-09-05 ENCOUNTER — Ambulatory Visit: Payer: BLUE CROSS/BLUE SHIELD | Admitting: Internal Medicine

## 2018-09-05 VITALS — BP 142/90 | HR 91 | Temp 99.2°F | Ht 79.0 in | Wt 231.0 lb

## 2018-09-05 DIAGNOSIS — B0229 Other postherpetic nervous system involvement: Secondary | ICD-10-CM

## 2018-09-05 DIAGNOSIS — B029 Zoster without complications: Secondary | ICD-10-CM | POA: Diagnosis not present

## 2018-09-05 DIAGNOSIS — M5416 Radiculopathy, lumbar region: Secondary | ICD-10-CM | POA: Diagnosis not present

## 2018-09-05 MED ORDER — GABAPENTIN 300 MG PO CAPS: 900.0000 mg | ORAL_CAPSULE | Freq: Every day | ORAL | 3 refills | Status: DC

## 2018-09-05 MED ORDER — OXYCODONE-ACETAMINOPHEN 7.5-325 MG PO TABS: 1.0000 | ORAL_TABLET | Freq: Two times a day (BID) | ORAL | 0 refills | Status: DC | PRN

## 2018-09-05 NOTE — Progress Notes (Signed)
Pre visit review using our clinic review tool, if applicable. No additional management support is needed unless otherwise documented below in the visit note. 

## 2018-09-05 NOTE — Progress Notes (Signed)
Chief Complaint  Patient presents with  . Follow-up   F/u still not better from postherpetic neuralgia pain has moved right under right arm to right shoulder blade upper back area burning pain 8/10 worse at night he has  been out of gabapentin 900 mg qhs since 09/01/18 and had racing HR Saturday when he did not have any pills. Wife tried cocoa butter cream/lotion to darker areas on his back from shingles which is helping.  He wants a note to be off work now from 09/11/2018 to 09/18/2018    Review of Systems  Constitutional: Negative for weight loss.  HENT: Negative for hearing loss.   Eyes: Negative for blurred vision.  Respiratory: Negative for shortness of breath.   Cardiovascular: Negative for chest pain and palpitations.  Skin: Negative for rash.  Neurological: Positive for tingling and sensory change.       Post shingles pain RUB    Past Medical History:  Diagnosis Date  . Allergy   . Arthritis   . Carpal tunnel syndrome on left   . CTS (carpal tunnel syndrome)    left  . DDD (degenerative disc disease), thoracolumbar    unknown location   . DDD (degenerative disc disease), thoracolumbar    multilevel  . Degenerative joint disease of left shoulder   . Degenerative joint disease of left shoulder    02/2011   . Diverticulosis    left colon (2008)   . ED (erectile dysfunction)    02/2011   . ED (erectile dysfunction)   . GERD (gastroesophageal reflux disease)   . H. pylori infection   . H. pylori infection   . Hyperlipidemia   . Hypertension   . Insomnia   . Lactose intolerance   . Lactose intolerance   . Low back pain   . Shingles    07/2018  . Smoking    smoking since age 49 y.o  . Toe fracture, left    4th in 2014  . Vitamin D deficiency   . Vitamin D deficiency    Past Surgical History:  Procedure Laterality Date  . APPENDECTOMY    . BACK SURGERY     x 2 10991 and 1995 in [place]   . CARDIAC CATHETERIZATION     1999 nl  . COLONOSCOPY     2008 diverticulosis   . ESOPHAGOGASTRODUODENOSCOPY     h/o +h. pylori  . OTHER SURGICAL HISTORY     heart catherization 1999 normal   . SPINE SURGERY     x 2    Family History  Problem Relation Age of Onset  . Heart disease Mother        age 61   . Throat cancer Father 52  . Cancer Father        throat   . Cancer Maternal Uncle        uncle ?maternal or paternal died colon cancer in his 10s   . Diabetes Maternal Aunt    Social History   Socioeconomic History  . Marital status: Married    Spouse name: Not on file  . Number of children: Not on file  . Years of education: Not on file  . Highest education level: Not on file  Occupational History  . Not on file  Social Needs  . Financial resource strain: Not on file  . Food insecurity:    Worry: Not on file    Inability: Not on file  . Transportation needs:    Medical: Not  on file    Non-medical: Not on file  Tobacco Use  . Smoking status: Current Every Day Smoker    Packs/day: 0.50    Start date: 07/06/1971  . Smokeless tobacco: Never Used  . Tobacco comment: cutting back  Substance and Sexual Activity  . Alcohol use: No    Alcohol/week: 0.0 standard drinks  . Drug use: No  . Sexual activity: Not on file  Lifestyle  . Physical activity:    Days per week: Not on file    Minutes per session: Not on file  . Stress: Not on file  Relationships  . Social connections:    Talks on phone: Not on file    Gets together: Not on file    Attends religious service: Not on file    Active member of club or organization: Not on file    Attends meetings of clubs or organizations: Not on file    Relationship status: Not on file  . Intimate partner violence:    Fear of current or ex partner: Not on file    Emotionally abused: Not on file    Physically abused: Not on file    Forced sexual activity: Not on file  Other Topics Concern  . 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 amitriptyline (ELAVIL) 25 MG tablet Take 1 tablet (25 mg total) by mouth at bedtime.  . chlorthalidone (HYGROTON) 25 MG tablet Take 1 tablet (25 mg total) by mouth daily.  Marland Kitchen gabapentin (NEURONTIN) 300 MG capsule Take 3 capsules (900 mg total) by mouth at bedtime.  Marland Kitchen losartan (COZAAR) 50 MG tablet Take 1 tablet (50 mg total) by mouth daily. In am  . oxyCODONE-acetaminophen (PERCOCET) 7.5-325 MG tablet Take 1 tablet by mouth 2 (two) times daily as needed for severe pain.  . valACYclovir (VALTREX) 1000 MG tablet Take 1 tablet (1,000 mg total) by mouth 3 (three) times daily.  . [DISCONTINUED] gabapentin (NEURONTIN) 300 MG capsule Take 3 capsules (900 mg total) by mouth at bedtime.   Allergies  Allergen Reactions  . Crestor [Rosuvastatin Calcium]     Elevated muscle enzymes   . Lipitor [Atorvastatin Calcium]     Elevated muscle enzymes   . Tomato Itching   Recent Results (from the past 2160 hour(s))  Urinalysis, Routine w reflex microscopic     Status: None   Collection Time: 08/01/18 11:57 AM  Result Value Ref Range   Color, Urine YELLOW YELLOW   APPearance CLEAR CLEAR   Specific Gravity, Urine 1.021 1.001 - 1.03   pH 6.0 5.0 - 8.0   Glucose, UA NEGATIVE NEGATIVE   Bilirubin Urine NEGATIVE NEGATIVE   Ketones, ur NEGATIVE NEGATIVE   Hgb urine dipstick NEGATIVE NEGATIVE   Protein, ur NEGATIVE NEGATIVE   Nitrite NEGATIVE NEGATIVE   Leukocytes, UA NEGATIVE NEGATIVE  Urine Culture     Status: None   Collection Time: 08/01/18 11:57 AM  Result Value Ref Range   MICRO NUMBER: [id number]    SPECIMEN QUALITY: Adequate    Sample Source NOT GIVEN    STATUS: FINAL    Result: No Growth    Objective  Body mass index is 26.02 kg/m. Wt Readings from Last 3 Encounters:  09/05/18 231 lb (104.8 kg)  08/30/18  226 lb 12.8 oz (102.9 kg)  08/17/18 227 lb 12.8 oz (103.3 kg)   Temp Readings from Last 3 Encounters:   09/05/18 99.2 F (37.3 C) (Oral)  08/30/18 98.5 F (36.9 C) (Oral)  08/17/18 98.7 F (37.1 C) (Oral)   BP Readings from Last 3 Encounters:  09/05/18 (!) 142/90  08/30/18 120/84  08/17/18 (!) 144/76   Pulse Readings from Last 3 Encounters:  09/05/18 91  08/30/18 (!) 103  08/17/18 (!) 105    Physical Exam Vitals signs and nursing note reviewed.  Constitutional:      Appearance: Normal appearance. He is well-developed and well-groomed.  HENT:     Head: Normocephalic and atraumatic.     Nose: Nose normal.     Mouth/Throat:     Mouth: Mucous membranes are moist.     Pharynx: Oropharynx is clear.  Eyes:     Conjunctiva/sclera: Conjunctivae normal.     Pupils: Pupils are equal, round, and reactive to light.  Cardiovascular:     Rate and Rhythm: Normal rate and regular rhythm.     Heart sounds: Normal heart sounds. No murmur.  Pulmonary:     Effort: Pulmonary effort is normal.     Breath sounds: Normal breath sounds.  Skin:    General: Skin is warm and dry.  Neurological:     General: No focal deficit present.     Mental Status: He is alert and oriented to person, place, and time. Mental status is at baseline.     Gait: Gait normal.  Psychiatric:        Attention and Perception: Attention and perception normal.        Mood and Affect: Mood and affect normal.        Speech: Speech normal.        Behavior: Behavior normal. Behavior is cooperative.        Thought Content: Thought content normal.        Cognition and Memory: Cognition and memory normal.        Judgment: Judgment normal.     Assessment   1. Postherpetic neuralgia  Plan   1. Cant do Tegretol due to DDI with other meds gabapentin and elavil  Cont elavil 25 mg qhs with gabapentin 900 mg qhs Last refill of percocet  Note for work to be off until 09/18/2018   Provider: Dr. French Ana [name]-Internal Medicine

## 2018-10-02 ENCOUNTER — Other Ambulatory Visit (INDEPENDENT_AMBULATORY_CARE_PROVIDER_SITE_OTHER): Payer: BLUE CROSS/BLUE SHIELD

## 2018-10-02 ENCOUNTER — Other Ambulatory Visit: Payer: Self-pay

## 2018-10-02 DIAGNOSIS — E785 Hyperlipidemia, unspecified: Secondary | ICD-10-CM | POA: Diagnosis not present

## 2018-10-02 DIAGNOSIS — N400 Enlarged prostate without lower urinary tract symptoms: Secondary | ICD-10-CM | POA: Diagnosis not present

## 2018-10-02 DIAGNOSIS — I10 Essential (primary) hypertension: Secondary | ICD-10-CM

## 2018-10-02 DIAGNOSIS — Z1329 Encounter for screening for other suspected endocrine disorder: Secondary | ICD-10-CM

## 2018-10-02 DIAGNOSIS — E559 Vitamin D deficiency, unspecified: Secondary | ICD-10-CM

## 2018-10-02 LAB — CBC WITH DIFFERENTIAL/PLATELET
Basophils Absolute: 0 10*3/uL (ref 0.0–0.1)
Basophils Relative: 0.7 % (ref 0.0–3.0)
EOS PCT: 3.5 % (ref 0.0–5.0)
Eosinophils Absolute: 0.2 10*3/uL (ref 0.0–0.7)
HCT: 40 % (ref 39.0–52.0)
Hemoglobin: 13.5 g/dL (ref 13.0–17.0)
Lymphocytes Relative: 23.4 % (ref 12.0–46.0)
Lymphs Abs: 1.3 10*3/uL (ref 0.7–4.0)
MCHC: 33.7 g/dL (ref 30.0–36.0)
MCV: 96.6 fl (ref 78.0–100.0)
Monocytes Absolute: 0.4 10*3/uL (ref 0.1–1.0)
Monocytes Relative: 7 % (ref 3.0–12.0)
Neutro Abs: 3.5 10*3/uL (ref 1.4–7.7)
Neutrophils Relative %: 65.4 % (ref 43.0–77.0)
Platelets: 306 10*3/uL (ref 150.0–400.0)
RBC: 4.15 Mil/uL — ABNORMAL LOW (ref 4.22–5.81)
RDW: 13.6 % (ref 11.5–15.5)
WBC: 5.4 10*3/uL (ref 4.0–10.5)

## 2018-10-02 LAB — LIPID PANEL
Cholesterol: 219 mg/dL — ABNORMAL HIGH (ref 0–200)
HDL: 57.1 mg/dL (ref >39.00)
LDL Cholesterol: 133 mg/dL — ABNORMAL HIGH (ref 0–99)
NONHDL: 161.45
Total CHOL/HDL Ratio: 4
Triglycerides: 140 mg/dL (ref 0.0–149.0)
VLDL: 28 mg/dL (ref 0.0–40.0)

## 2018-10-02 LAB — COMPLETE METABOLIC PANEL WITH GFR
AG Ratio: 1.5 (calc) (ref 1.0–2.5)
ALT: 24 U/L (ref 9–46)
AST: 32 U/L (ref 10–35)
Albumin: 4.3 g/dL (ref 3.6–5.1)
Alkaline phosphatase (APISO): 78 U/L (ref 35–144)
BUN: 13 mg/dL (ref 7–25)
CALCIUM: 9.5 mg/dL (ref 8.6–10.3)
CO2: 28 mmol/L (ref 20–32)
Chloride: 101 mmol/L (ref 98–110)
Creat: 1.06 mg/dL (ref 0.70–1.25)
GFR, Est African American: 83 mL/min/{1.73_m2} (ref >=60)
GFR, Est Non African American: 72 mL/min/{1.73_m2} (ref >=60)
Globulin: 2.9 g/dL (calc) (ref 1.9–3.7)
Glucose, Bld: 117 mg/dL — ABNORMAL HIGH (ref 65–99)
Potassium: 3.5 mmol/L (ref 3.5–5.3)
Sodium: 139 mmol/L (ref 135–146)
Total Bilirubin: 0.5 mg/dL (ref 0.2–1.2)
Total Protein: 7.2 g/dL (ref 6.1–8.1)

## 2018-10-02 LAB — PSA, MEDICARE: PSA: 1.53 ng/ml (ref 0.10–4.00)

## 2018-10-02 LAB — VITAMIN D 25 HYDROXY (VIT D DEFICIENCY, FRACTURES): VITD: 20.75 ng/mL — ABNORMAL LOW (ref 30.00–100.00)

## 2018-10-02 LAB — TSH: TSH: 1.12 u[IU]/mL (ref 0.35–4.50)

## 2018-11-01 ENCOUNTER — Ambulatory Visit (INDEPENDENT_AMBULATORY_CARE_PROVIDER_SITE_OTHER): Payer: BLUE CROSS/BLUE SHIELD | Admitting: Internal Medicine

## 2018-11-01 DIAGNOSIS — E785 Hyperlipidemia, unspecified: Secondary | ICD-10-CM | POA: Diagnosis not present

## 2018-11-01 DIAGNOSIS — R300 Dysuria: Secondary | ICD-10-CM

## 2018-11-01 DIAGNOSIS — E559 Vitamin D deficiency, unspecified: Secondary | ICD-10-CM

## 2018-11-01 DIAGNOSIS — I10 Essential (primary) hypertension: Secondary | ICD-10-CM | POA: Diagnosis not present

## 2018-11-01 MED ORDER — EZETIMIBE 10 MG PO TABS: 10.0000 mg | ORAL_TABLET | Freq: Every day | ORAL | 3 refills | Status: DC

## 2018-11-01 NOTE — Progress Notes (Signed)
telephone Note  I connected with John Keith on 11/01/18 at  9:11 AM EDT telephone and verified that I am speaking with the correct person using two identifiers.  Location patient: home Location provider:work  Persons participating in the virtual visit: patient, provider  I discussed the limitations of evaluation and management by telemedicine and the availability of in person appointments. The patient expressed understanding and agreed to proceed.   HPI: 1. HTN not checking BP on chlorthalidone 25 mg qd and losartan 50 mg qd no dizziness or h/a  2. Shingles resolved  3. HLD and sugar elevated reviewed healthy diet options like to drink strawberry soda  4. C/o burning with urination    ROS: See pertinent positives and negatives per HPI.  Past Medical History:  Diagnosis Date  . Allergy   . Arthritis   . Carpal tunnel syndrome on left   . CTS (carpal tunnel syndrome)    left  . DDD (degenerative disc disease), thoracolumbar    unknown location   . DDD (degenerative disc disease), thoracolumbar    multilevel  . Degenerative joint disease of left shoulder   . Degenerative joint disease of left shoulder    02/2011   . Diverticulosis    left colon (2008)   . ED (erectile dysfunction)    02/2011   . ED (erectile dysfunction)   . GERD (gastroesophageal reflux disease)   . H. pylori infection   . H. pylori infection   . Hyperlipidemia   . Hypertension   . Insomnia   . Lactose intolerance   . Lactose intolerance   . Low back pain   . Shingles    07/2018  . Smoking    smoking since age 31 y.o  . Toe fracture, left    4th in 2014  . Vitamin D deficiency   . Vitamin D deficiency     Past Surgical History:  Procedure Laterality Date  . APPENDECTOMY    . BACK SURGERY     x 2 10991 and 1995 in [place]   . CARDIAC CATHETERIZATION     1999 nl  . COLONOSCOPY     2008 diverticulosis  . ESOPHAGOGASTRODUODENOSCOPY     h/o +h. pylori  . OTHER SURGICAL HISTORY     heart  catherization 1999 normal   . SPINE SURGERY     x 2     Family History  Problem Relation Age of Onset  . Heart disease Mother        age 37   . Throat cancer Father 6  . Cancer Father        throat   . Cancer Maternal Uncle        uncle ?maternal or paternal died colon cancer in his 72s   . Diabetes Maternal Aunt     SOCIAL HX: married    Current Outpatient Medications:  .  Cholecalciferol (VITAMIN D-3) 125 MCG (5000 UT) TABS, Take by mouth., Disp: , Rfl:  .  amitriptyline (ELAVIL) 25 MG tablet, Take 1 tablet (25 mg total) by mouth at bedtime., Disp: 90 tablet, Rfl: 1 .  chlorthalidone (HYGROTON) 25 MG tablet, Take 1 tablet (25 mg total) by mouth daily., Disp: 90 tablet, Rfl: 3 .  ezetimibe (ZETIA) 10 MG tablet, Take 1 tablet (10 mg total) by mouth daily., Disp: 90 tablet, Rfl: 3 .  gabapentin (NEURONTIN) 300 MG capsule, Take 3 capsules (900 mg total) by mouth at bedtime., Disp: 180 capsule, Rfl: 3 .  losartan (  COZAAR) 50 MG tablet, Take 1 tablet (50 mg total) by mouth daily. In am, Disp: 90 tablet, Rfl: 3  EXAM:  VITALS per patient if applicable:  GENERAL: alert, oriented, appears well and in no acute distress  PSYCH/NEURO: pleasant and cooperative, no obvious depression or anxiety, speech and thought processing grossly intact  ASSESSMENT AND PLAN:  Discussed the following assessment and plan:  Hyperlipidemia, unspecified hyperlipidemia type - Plan: ezetimibe (ZETIA) 10 MG tablet mailed cholesterol handout   Essential hypertension-cont meds    Vitamin D deficiency D3 5000 IU qd otc   Dysuria - Plan: Urinalysis, Routine w reflex microscopic, Urine Culture, Urine cytology ancillary only([place])  -w/u with urine less likely meds causing   Hm Flu shot? 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 The patient was provided an opportunity to ask questions and all were answered. The patient agreed with the plan and demonstrated an understanding of the instructions.   The patient was advised to call back or seek an in-person evaluation if the symptoms worsen or if the condition fails to improve as anticipated.  Time spent 20 minutes  Delorise Jackson, MD

## 2019-01-11 ENCOUNTER — Other Ambulatory Visit: Payer: Self-pay | Admitting: Internal Medicine

## 2019-01-11 ENCOUNTER — Telehealth: Payer: Self-pay | Admitting: Internal Medicine

## 2019-01-11 DIAGNOSIS — M79672 Pain in left foot: Secondary | ICD-10-CM

## 2019-01-11 NOTE — Telephone Encounter (Signed)
Referred TFR [facility]

## 2019-01-11 NOTE — Telephone Encounter (Signed)
Called pt to scheduled a virtual visit.  No answer and unable to leave a message.

## 2019-01-11 NOTE — Telephone Encounter (Signed)
Relation to pt: self  Call back number: [phone]   Reason for call:  Patient requesting referral to podiatry due to spur on the bottom of left foot for 1 week, patient states spur / pain is making it difficult for him to walk, please advise

## 2019-01-22 ENCOUNTER — Ambulatory Visit (INDEPENDENT_AMBULATORY_CARE_PROVIDER_SITE_OTHER): Payer: BLUE CROSS/BLUE SHIELD | Admitting: Podiatry

## 2019-01-22 ENCOUNTER — Other Ambulatory Visit: Payer: Self-pay

## 2019-01-22 ENCOUNTER — Ambulatory Visit (INDEPENDENT_AMBULATORY_CARE_PROVIDER_SITE_OTHER): Payer: BLUE CROSS/BLUE SHIELD

## 2019-01-22 ENCOUNTER — Encounter: Payer: Self-pay | Admitting: Podiatry

## 2019-01-22 ENCOUNTER — Other Ambulatory Visit: Payer: Self-pay | Admitting: Podiatry

## 2019-01-22 VITALS — Temp 98.3°F

## 2019-01-22 DIAGNOSIS — M722 Plantar fascial fibromatosis: Secondary | ICD-10-CM

## 2019-01-22 DIAGNOSIS — M79672 Pain in left foot: Secondary | ICD-10-CM

## 2019-01-22 MED ORDER — MELOXICAM 15 MG PO TABS: 15.0000 mg | ORAL_TABLET | Freq: Every day | ORAL | 1 refills | Status: DC

## 2019-01-24 NOTE — Progress Notes (Signed)
   Subjective: 68 y.o. male presenting today as a new patient with a chief complaint of soreness and sharp pain located at the plantar left heel that began 1-2 months ago. He reports associated pain to the ball of the foot as well. Walking and standing for long periods of time increases the pain. He has not done anything for treatment. Patient is here for further evaluation and treatment.   Past Medical History:  Diagnosis Date  . Allergy   . Arthritis   . Carpal tunnel syndrome on left   . CTS (carpal tunnel syndrome)    left  . DDD (degenerative disc disease), thoracolumbar    unknown location   . DDD (degenerative disc disease), thoracolumbar    multilevel  . Degenerative joint disease of left shoulder   . Degenerative joint disease of left shoulder    02/2011   . Diverticulosis    left colon (2008)   . ED (erectile dysfunction)    02/2011   . ED (erectile dysfunction)   . GERD (gastroesophageal reflux disease)   . H. pylori infection   . H. pylori infection   . Hyperlipidemia   . Hypertension   . Insomnia   . Lactose intolerance   . Lactose intolerance   . Low back pain   . Shingles    07/2018  . Smoking    smoking since age 12 y.o  . Toe fracture, left    4th in 2014  . Vitamin D deficiency   . Vitamin D deficiency      Objective: Physical Exam General: The patient is alert and oriented x3 in no acute distress.  Dermatology: Skin is warm, dry and supple bilateral lower extremities. Negative for open lesions or macerations bilateral.   Vascular: Dorsalis Pedis and Posterior Tibial pulses palpable bilateral.  Capillary fill time is immediate to all digits.  Neurological: Epicritic and protective threshold intact bilateral.   Musculoskeletal: Tenderness to palpation to the plantar aspect of the left heel along the plantar fascia. All other joints range of motion within normal limits bilateral. Strength 5/5 in all groups bilateral.   Radiographic exam: Normal  osseous mineralization. Joint spaces preserved. No fracture/dislocation/boney destruction. No other soft tissue abnormalities or radiopaque foreign bodies.   Assessment: 1. Plantar fasciitis left foot  Plan of Care:  1. Patient evaluated. Xrays reviewed.   2. Injection of 0.5cc Celestone soluspan injected into the left plantar fascia.  3. Recommended good shoe gear.  4. Rx for Meloxicam ordered for patient. 5. Plantar fascial band(s) dispensed  6. Instructed patient regarding therapies and modalities at home to alleviate symptoms.  7. Return to clinic in 4 weeks.    Works Music therapist.    Edrick Kins, DPM [facility]  Dr. Edrick Kins, DPM    [address], [place] [address]                Office [phone]  Fax [phone]

## 2019-02-19 ENCOUNTER — Other Ambulatory Visit: Payer: Self-pay

## 2019-02-19 ENCOUNTER — Encounter: Payer: Self-pay | Admitting: Podiatry

## 2019-02-19 ENCOUNTER — Ambulatory Visit (INDEPENDENT_AMBULATORY_CARE_PROVIDER_SITE_OTHER): Payer: BLUE CROSS/BLUE SHIELD | Admitting: Podiatry

## 2019-02-19 VITALS — Temp 97.4°F

## 2019-02-19 DIAGNOSIS — M7752 Other enthesopathy of left foot: Secondary | ICD-10-CM | POA: Diagnosis not present

## 2019-02-19 DIAGNOSIS — S92532A Displaced fracture of distal phalanx of left lesser toe(s), initial encounter for closed fracture: Secondary | ICD-10-CM | POA: Diagnosis not present

## 2019-02-19 DIAGNOSIS — M722 Plantar fascial fibromatosis: Secondary | ICD-10-CM | POA: Diagnosis not present

## 2019-02-21 NOTE — Progress Notes (Signed)
   HPI: 68 y.o. male presenting today for follow up evaluation of plantar fasciitis of the left foot. He states he is doing well. He reports the pain has improved but he is still present around the plantar forefoot. He has been taking the Meloxicam for treatment. There are no modifying factors noted. Patient is here for further evaluation and treatment.   Past Medical History:  Diagnosis Date  . Allergy   . Arthritis   . Carpal tunnel syndrome on left   . CTS (carpal tunnel syndrome)    left  . DDD (degenerative disc disease), thoracolumbar    unknown location   . DDD (degenerative disc disease), thoracolumbar    multilevel  . Degenerative joint disease of left shoulder   . Degenerative joint disease of left shoulder    02/2011   . Diverticulosis    left colon (2008)   . ED (erectile dysfunction)    02/2011   . ED (erectile dysfunction)   . GERD (gastroesophageal reflux disease)   . H. pylori infection   . H. pylori infection   . Hyperlipidemia   . Hypertension   . Insomnia   . Lactose intolerance   . Lactose intolerance   . Low back pain   . Shingles    07/2018  . Smoking    smoking since age 48 y.o  . Toe fracture, left    4th in 2014  . Vitamin D deficiency   . Vitamin D deficiency      Physical Exam: General: The patient is alert and oriented x3 in no acute distress.  Dermatology: Skin is warm, dry and supple bilateral lower extremities. Negative for open lesions or macerations.  Vascular: Palpable pedal pulses bilaterally. No edema or erythema noted. Capillary refill within normal limits.  Neurological: Epicritic and protective threshold grossly intact bilaterally.   Musculoskeletal Exam: Pain with palpation noted to the proximal third toe as well as the 2nd and 3rd MPJs of the left foot. Range of motion within normal limits to all pedal and ankle joints bilateral. Muscle strength 5/5 in all groups bilateral.   Assessment: 1. Plantar fasciitis left foot -  healed  2. Fracture proximal phalanx 3rd toe left foot with routine healing  3. 2nd MPJ capsulitis left 4. 3rd MPJ capsulitis left    Plan of Care:  1. Patient evaluated.  2. Injection of 0.5 mLs Celestone Soluspan injected into the 2nd MPJ of the left foot.  3. Met pads dispensed.  4. Continue taking Meloxicam.  5. Recommended good shoe gear.  6. Return to clinic in 4 weeks.   Works Music therapist.     Edrick Kins, DPM [facility]  Dr. Edrick Kins, DPM    [address], [place] [address]                Office [phone]  Fax [phone]

## 2019-03-14 ENCOUNTER — Ambulatory Visit: Payer: BLUE CROSS/BLUE SHIELD | Admitting: Internal Medicine

## 2019-03-19 ENCOUNTER — Encounter: Payer: Self-pay | Admitting: Podiatry

## 2019-03-19 ENCOUNTER — Other Ambulatory Visit: Payer: Self-pay

## 2019-03-19 ENCOUNTER — Ambulatory Visit (INDEPENDENT_AMBULATORY_CARE_PROVIDER_SITE_OTHER): Payer: BC Managed Care – PPO | Admitting: Podiatry

## 2019-03-19 DIAGNOSIS — M79676 Pain in unspecified toe(s): Secondary | ICD-10-CM | POA: Diagnosis not present

## 2019-03-19 DIAGNOSIS — B351 Tinea unguium: Secondary | ICD-10-CM

## 2019-03-19 DIAGNOSIS — L989 Disorder of the skin and subcutaneous tissue, unspecified: Secondary | ICD-10-CM | POA: Diagnosis not present

## 2019-03-21 NOTE — Progress Notes (Signed)
    Subjective: Patient is a 68 y.o. male presenting to the office today with a chief complaint of painful callus lesion(s) noted to the bilateral feet that have been present for the past several months. Standing and walking barefoot increases the pain. He has not had any treatment for the symptoms.  Patient also complains of elongated, thickened nails that cause pain while ambulating in shoes. He is unable to trim his own nails. Patient presents today for further treatment and evaluation.  Past Medical History:  Diagnosis Date  . Allergy   . Arthritis   . Carpal tunnel syndrome on left   . CTS (carpal tunnel syndrome)    left  . DDD (degenerative disc disease), thoracolumbar    unknown location   . DDD (degenerative disc disease), thoracolumbar    multilevel  . Degenerative joint disease of left shoulder   . Degenerative joint disease of left shoulder    02/2011   . Diverticulosis    left colon (2008)   . ED (erectile dysfunction)    02/2011   . ED (erectile dysfunction)   . GERD (gastroesophageal reflux disease)   . H. pylori infection   . H. pylori infection   . Hyperlipidemia   . Hypertension   . Insomnia   . Lactose intolerance   . Lactose intolerance   . Low back pain   . Shingles    07/2018  . Smoking    smoking since age 60 y.o  . Toe fracture, left    4th in 2014  . Vitamin D deficiency   . Vitamin D deficiency     Objective:  Physical Exam General: Alert and oriented x3 in no acute distress  Dermatology: Hyperkeratotic lesion(s) present on the bilateral feet. Pain on palpation with a central nucleated core noted. Skin is warm, dry and supple bilateral lower extremities. Negative for open lesions or macerations. Nails are tender, long, thickened and dystrophic with subungual debris, consistent with onychomycosis, 1-5 bilateral. No signs of infection noted.  Vascular: Palpable pedal pulses bilaterally. No edema or erythema noted. Capillary refill within normal  limits.  Neurological: Epicritic and protective threshold grossly intact bilaterally.   Musculoskeletal Exam: Pain on palpation at the keratotic lesion(s) noted. Range of motion within normal limits bilateral. Muscle strength 5/5 in all groups bilateral.  Assessment: 1. Onychodystrophic nails 1-5 bilateral with hyperkeratosis of nails.  2. Onychomycosis of nail due to dermatophyte bilateral 3. Pre-ulcerative callus lesions noted to the bilateral feet x 5   Plan of Care:  1. Patient evaluated. 2. Excisional debridement of keratoic lesion(s) using a chisel blade was performed without incident.  3. Dressed with light dressing. 4. Mechanical debridement of nails 1-5 bilaterally performed using a nail nipper. Filed with dremel without incident.  5. Patient is to return to the clinic as needed.  Works Music therapist.    Edrick Kins, DPM [facility]  Dr. Edrick Kins, [address]                                        [place], [place] [address]                Office [phone]  Fax [phone]

## 2019-03-28 ENCOUNTER — Ambulatory Visit: Payer: BLUE CROSS/BLUE SHIELD | Admitting: Internal Medicine

## 2019-04-16 ENCOUNTER — Telehealth: Payer: Self-pay | Admitting: *Deleted

## 2019-04-16 NOTE — Telephone Encounter (Signed)
[facility] sent over a second refill request for meloxicam and per Dr Amalia Hailey was ok to fill and have 2 refills and I called the patient to let them know to pick up at pharmacy. Lattie Haw

## 2019-06-07 ENCOUNTER — Ambulatory Visit (INDEPENDENT_AMBULATORY_CARE_PROVIDER_SITE_OTHER): Payer: BC Managed Care – PPO | Admitting: Internal Medicine

## 2019-06-07 ENCOUNTER — Telehealth: Payer: Self-pay | Admitting: Internal Medicine

## 2019-06-07 ENCOUNTER — Encounter: Payer: Self-pay | Admitting: Internal Medicine

## 2019-06-07 VITALS — Ht 79.0 in | Wt 231.0 lb

## 2019-06-07 DIAGNOSIS — B029 Zoster without complications: Secondary | ICD-10-CM

## 2019-06-07 DIAGNOSIS — Z113 Encounter for screening for infections with a predominantly sexual mode of transmission: Secondary | ICD-10-CM

## 2019-06-07 DIAGNOSIS — I10 Essential (primary) hypertension: Secondary | ICD-10-CM | POA: Diagnosis not present

## 2019-06-07 DIAGNOSIS — E559 Vitamin D deficiency, unspecified: Secondary | ICD-10-CM

## 2019-06-07 DIAGNOSIS — E785 Hyperlipidemia, unspecified: Secondary | ICD-10-CM

## 2019-06-07 DIAGNOSIS — B0229 Other postherpetic nervous system involvement: Secondary | ICD-10-CM

## 2019-06-07 DIAGNOSIS — M5416 Radiculopathy, lumbar region: Secondary | ICD-10-CM

## 2019-06-07 DIAGNOSIS — Z23 Encounter for immunization: Secondary | ICD-10-CM

## 2019-06-07 DIAGNOSIS — R300 Dysuria: Secondary | ICD-10-CM

## 2019-06-07 DIAGNOSIS — R7303 Prediabetes: Secondary | ICD-10-CM

## 2019-06-07 MED ORDER — SHINGRIX 50 MCG/0.5ML IM SUSR: 0.5000 mL | Freq: Once | INTRAMUSCULAR | 0 refills | Status: AC

## 2019-06-07 MED ORDER — EZETIMIBE 10 MG PO TABS: 10.0000 mg | ORAL_TABLET | Freq: Every day | ORAL | 3 refills | Status: DC

## 2019-06-07 MED ORDER — LOSARTAN POTASSIUM 50 MG PO TABS: 50.0000 mg | ORAL_TABLET | Freq: Every day | ORAL | 3 refills | Status: DC

## 2019-06-07 MED ORDER — CHLORTHALIDONE 25 MG PO TABS: 25.0000 mg | ORAL_TABLET | Freq: Every day | ORAL | 3 refills | Status: DC

## 2019-06-07 NOTE — Progress Notes (Signed)
Telephone Note  I connected with John Keith   on 06/07/19 at  8:30 AM EST by telephone and verified that I am speaking with the correct person using two identifiers.  Location patient: home Location provider:work or home office Persons participating in the virtual visit: patient, provider  I discussed the limitations of evaluation and management by telemedicine and the availability of in person appointments. The patient expressed understanding and agreed to proceed.   HPI: 1. HTN no checking BP on losartan 50 mg though taking 25 mg has multiple bottles and chlorthalidone 25 mg qd not checking BP 2. C/o burning with urination will check urine and if negative refer urology  3. No shingles pain or back pain today     ROS: See pertinent positives and negatives per HPI.  Past Medical History:  Diagnosis Date  . Allergy   . Arthritis   . Carpal tunnel syndrome on left   . CTS (carpal tunnel syndrome)    left  . DDD (degenerative disc disease), thoracolumbar    unknown location   . DDD (degenerative disc disease), thoracolumbar    multilevel  . Degenerative joint disease of left shoulder   . Degenerative joint disease of left shoulder    02/2011   . Diverticulosis    left colon (2008)   . ED (erectile dysfunction)    02/2011   . ED (erectile dysfunction)   . GERD (gastroesophageal reflux disease)   . H. pylori infection   . H. pylori infection   . Hyperlipidemia   . Hypertension   . Insomnia   . Lactose intolerance   . Lactose intolerance   . Low back pain   . Shingles    07/2018  . Smoking    smoking since age 34 y.o  . Toe fracture, left    4th in 2014  . Vitamin D deficiency   . Vitamin D deficiency     Past Surgical History:  Procedure Laterality Date  . APPENDECTOMY    . BACK SURGERY     x 2 10991 and 1995 in [place]   . CARDIAC CATHETERIZATION     1999 nl  . COLONOSCOPY     2008 diverticulosis  . ESOPHAGOGASTRODUODENOSCOPY     h/o +h. pylori  . OTHER  SURGICAL HISTORY     heart catherization 1999 normal   . SPINE SURGERY     x 2     Family History  Problem Relation Age of Onset  . Heart disease Mother        age 43   . Throat cancer Father 20  . Cancer Father        throat   . Cancer Maternal Uncle        uncle ?maternal or paternal died colon cancer in his 18s   . 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  chlorthalidone (HYGROTON) 25 MG tablet, Take 1 tablet (25 mg total) by mouth daily., Disp: 90 tablet, Rfl: 3 .  Cholecalciferol (VITAMIN D-3) 125 MCG (5000 UT) TABS, Take by mouth., Disp: , Rfl:  .  ezetimibe (ZETIA) 10 MG tablet, Take 1 tablet (10 mg total) by mouth daily., Disp: 90 tablet, Rfl: 3 .  losartan (COZAAR) 50 MG tablet, Take 1 tablet (50 mg total) by mouth daily. In am,  Disp: 90 tablet, Rfl: 3 .  Zoster Vaccine Adjuvanted [facility]) injection, Inject 0.5 mLs into the muscle once for 1 dose., Disp: 0.5 mL, Rfl: 0 .  Zoster Vaccine Adjuvanted (SHINGRIX) injection, Inject 0.5 mLs into the muscle once for 1 dose., Disp: 0.5 mL, Rfl: 0  EXAM:  VITALS per patient if applicable:  GENERAL: alert, oriented, appears well and in no acute distress  HEENT: atraumatic, conjunttiva clear, no obvious abnormalities on inspection of external nose and ears  NECK: normal movements of the head and neck  LUNGS: on inspection no signs of respiratory distress, breathing rate appears normal, no obvious gross SOB, gasping or wheezing  CV: no obvious cyanosis  MS: moves all visible extremities without noticeable abnormality  PSYCH/NEURO: pleasant and cooperative, no obvious depression or anxiety, speech and thought processing grossly intact  ASSESSMENT AND PLAN:  Discussed the following assessment and plan:  Essential hypertension - Plan: Comprehensive metabolic panel, CBC  with Differential/Platelet, chlorthalidone (HYGROTON) 25 MG tablet, losartan (COZAAR) 50 MG tablet  Dysuria - Plan: Urinalysis, Routine w reflex microscopic, Urine Culture, Urine cytology ancillary only([place]) -if urine negative refer urology    Hyperlipidemia, unspecified hyperlipidemia type - Plan: Lipid panel, ezetimibe (ZETIA) 10 MG tablet  Prediabetes - Plan: HgB A1c  Vitamin D deficiency - Plan: Vitamin D (25 hydroxy)  Herpes zoster with PHN improved  D/c gabapentin and elavil  Lumbar radiculopathy -improved d/c meds as above  Hm Flu shotutd prevnar and pna 23 utd11/30/17 and 02/16/15 Tdap had 06/15/13  shingrix sent to pharmacy h/o shingles   Hep B immune  PSA 10/02/2018 normal  Hep C neg 06/03/16 D/c MMR check 2/2 cost Labs 3/30 reviewd check poc A1C in future   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 Pt prefers to treat via telemedicine empirically rather then risking or undertaking an in person visit at this moment. Patient agrees to seek prompt in person care if worsening, new symptoms arise, or if is not improving with treatment.   I discussed the assessment and treatment plan with the patient. The patient was provided an opportunity to ask questions and all were answered. The patient agreed with the plan and demonstrated an understanding of the instructions.   The patient was advised to call back or seek an in-person evaluation if the symptoms worsen or if the condition fails to improve as anticipated.  Time spent 25 minutes  Delorise Jackson, MD

## 2019-06-07 NOTE — Telephone Encounter (Signed)
I called pt and was unable to leave vm. If pt calls pt needs to be scheduled F/u in 4-6 months and Check insurance coverage please and Sch fasting labs asap on Monday/Thursday in am   Thank you!

## 2019-06-08 ENCOUNTER — Other Ambulatory Visit: Payer: Self-pay

## 2019-06-11 ENCOUNTER — Other Ambulatory Visit (INDEPENDENT_AMBULATORY_CARE_PROVIDER_SITE_OTHER): Payer: BC Managed Care – PPO

## 2019-06-11 ENCOUNTER — Other Ambulatory Visit (HOSPITAL_COMMUNITY)
Admission: RE | Admit: 2019-06-11 | Discharge: 2019-06-11 | Disposition: A | Discharge status: Home / Self Care | Payer: BC Managed Care – PPO | Source: Ambulatory Visit | Attending: Internal Medicine | Admitting: Internal Medicine

## 2019-06-11 ENCOUNTER — Other Ambulatory Visit: Payer: Self-pay

## 2019-06-11 DIAGNOSIS — I10 Essential (primary) hypertension: Secondary | ICD-10-CM

## 2019-06-11 DIAGNOSIS — E559 Vitamin D deficiency, unspecified: Secondary | ICD-10-CM | POA: Diagnosis not present

## 2019-06-11 DIAGNOSIS — R300 Dysuria: Secondary | ICD-10-CM

## 2019-06-11 DIAGNOSIS — R7303 Prediabetes: Secondary | ICD-10-CM

## 2019-06-11 DIAGNOSIS — Z113 Encounter for screening for infections with a predominantly sexual mode of transmission: Secondary | ICD-10-CM | POA: Diagnosis not present

## 2019-06-11 DIAGNOSIS — E785 Hyperlipidemia, unspecified: Secondary | ICD-10-CM

## 2019-06-11 LAB — CBC WITH DIFFERENTIAL/PLATELET
Basophils Absolute: 0 10*3/uL (ref 0.0–0.1)
Basophils Relative: 0.8 % (ref 0.0–3.0)
Eosinophils Absolute: 0.1 10*3/uL (ref 0.0–0.7)
Eosinophils Relative: 2.5 % (ref 0.0–5.0)
HCT: 40.9 % (ref 39.0–52.0)
Hemoglobin: 13.4 g/dL (ref 13.0–17.0)
Lymphocytes Relative: 28.3 % (ref 12.0–46.0)
Lymphs Abs: 1.5 10*3/uL (ref 0.7–4.0)
MCHC: 32.6 g/dL (ref 30.0–36.0)
MCV: 96.2 fl (ref 78.0–100.0)
Monocytes Absolute: 0.5 10*3/uL (ref 0.1–1.0)
Monocytes Relative: 8.9 % (ref 3.0–12.0)
Neutro Abs: 3.1 10*3/uL (ref 1.4–7.7)
Neutrophils Relative %: 59.5 % (ref 43.0–77.0)
Platelets: 305 10*3/uL (ref 150.0–400.0)
RBC: 4.26 Mil/uL (ref 4.22–5.81)
RDW: 13.3 % (ref 11.5–15.5)
WBC: 5.2 10*3/uL (ref 4.0–10.5)

## 2019-06-11 LAB — COMPREHENSIVE METABOLIC PANEL
ALT: 31 U/L (ref 0–53)
AST: 33 U/L (ref 0–37)
Albumin: 4.3 g/dL (ref 3.5–5.2)
Alkaline Phosphatase: 93 U/L (ref 39–117)
BUN: 13 mg/dL (ref 6–23)
CO2: 30 mEq/L (ref 19–32)
Calcium: 9.6 mg/dL (ref 8.4–10.5)
Chloride: 101 mEq/L (ref 96–112)
Creatinine, Ser: 1.06 mg/dL (ref 0.40–1.50)
GFR: 83.66 mL/min (ref >60.00)
Glucose, Bld: 96 mg/dL (ref 70–99)
Potassium: 3.6 mEq/L (ref 3.5–5.1)
Sodium: 140 mEq/L (ref 135–145)
Total Bilirubin: 0.5 mg/dL (ref 0.2–1.2)
Total Protein: 7.1 g/dL (ref 6.0–8.3)

## 2019-06-11 LAB — LIPID PANEL
Cholesterol: 164 mg/dL (ref 0–200)
HDL: 64.1 mg/dL (ref >39.00)
LDL Cholesterol: 83 mg/dL (ref 0–99)
NonHDL: 100.22
Total CHOL/HDL Ratio: 3
Triglycerides: 86 mg/dL (ref 0.0–149.0)
VLDL: 17.2 mg/dL (ref 0.0–40.0)

## 2019-06-11 LAB — VITAMIN D 25 HYDROXY (VIT D DEFICIENCY, FRACTURES): VITD: 55.48 ng/mL (ref 30.00–100.00)

## 2019-06-11 LAB — HEMOGLOBIN A1C: Hgb A1c MFr Bld: 5.3 % (ref 4.6–6.5)

## 2019-06-12 LAB — URINALYSIS, ROUTINE W REFLEX MICROSCOPIC
Bilirubin Urine: NEGATIVE
Glucose, UA: NEGATIVE
Hgb urine dipstick: NEGATIVE
Ketones, ur: NEGATIVE
Leukocytes,Ua: NEGATIVE
Nitrite: NEGATIVE
Protein, ur: NEGATIVE
Specific Gravity, Urine: 1.019 (ref 1.001–1.03)
pH: 5.5 (ref 5.0–8.0)

## 2019-06-12 LAB — URINE CULTURE
MICRO NUMBER:: 1170602
Result:: NO GROWTH
SPECIMEN QUALITY:: ADEQUATE

## 2019-06-18 LAB — URINE CYTOLOGY ANCILLARY ONLY
Bacterial Vaginitis-Urine: NEGATIVE
Candida Urine: NEGATIVE
Comment: NEGATIVE
Trichomonas: NEGATIVE

## 2019-07-07 DIAGNOSIS — E860 Dehydration: Secondary | ICD-10-CM | POA: Diagnosis not present

## 2019-07-07 DIAGNOSIS — M549 Dorsalgia, unspecified: Secondary | ICD-10-CM | POA: Diagnosis not present

## 2019-07-07 DIAGNOSIS — R778 Other specified abnormalities of plasma proteins: Secondary | ICD-10-CM | POA: Diagnosis not present

## 2019-07-07 DIAGNOSIS — R779 Abnormality of plasma protein, unspecified: Secondary | ICD-10-CM | POA: Insufficient documentation

## 2019-07-07 DIAGNOSIS — I4949 Other premature depolarization: Secondary | ICD-10-CM | POA: Diagnosis not present

## 2019-07-07 DIAGNOSIS — E876 Hypokalemia: Secondary | ICD-10-CM | POA: Diagnosis not present

## 2019-07-07 DIAGNOSIS — R509 Fever, unspecified: Secondary | ICD-10-CM | POA: Diagnosis not present

## 2019-07-07 DIAGNOSIS — E7849 Other hyperlipidemia: Secondary | ICD-10-CM | POA: Diagnosis not present

## 2019-07-07 DIAGNOSIS — M6281 Muscle weakness (generalized): Secondary | ICD-10-CM | POA: Diagnosis not present

## 2019-07-07 DIAGNOSIS — R29898 Other symptoms and signs involving the musculoskeletal system: Secondary | ICD-10-CM | POA: Diagnosis not present

## 2019-07-07 DIAGNOSIS — U071 COVID-19: Secondary | ICD-10-CM | POA: Diagnosis not present

## 2019-07-07 DIAGNOSIS — R309 Painful micturition, unspecified: Secondary | ICD-10-CM | POA: Diagnosis not present

## 2019-07-07 DIAGNOSIS — Z6822 Body mass index (BMI) 22.0-22.9, adult: Secondary | ICD-10-CM | POA: Diagnosis not present

## 2019-07-07 DIAGNOSIS — F17210 Nicotine dependence, cigarettes, uncomplicated: Secondary | ICD-10-CM | POA: Diagnosis not present

## 2019-07-07 DIAGNOSIS — I10 Essential (primary) hypertension: Secondary | ICD-10-CM | POA: Diagnosis not present

## 2019-07-07 DIAGNOSIS — R319 Hematuria, unspecified: Secondary | ICD-10-CM | POA: Diagnosis not present

## 2019-07-07 DIAGNOSIS — R7989 Other specified abnormal findings of blood chemistry: Secondary | ICD-10-CM | POA: Insufficient documentation

## 2019-07-08 DIAGNOSIS — Z6822 Body mass index (BMI) 22.0-22.9, adult: Secondary | ICD-10-CM | POA: Diagnosis not present

## 2019-07-08 DIAGNOSIS — R7989 Other specified abnormal findings of blood chemistry: Secondary | ICD-10-CM | POA: Diagnosis not present

## 2019-07-08 DIAGNOSIS — I214 Non-ST elevation (NSTEMI) myocardial infarction: Secondary | ICD-10-CM | POA: Diagnosis not present

## 2019-07-08 DIAGNOSIS — Z9981 Dependence on supplemental oxygen: Secondary | ICD-10-CM | POA: Diagnosis not present

## 2019-07-08 DIAGNOSIS — E860 Dehydration: Secondary | ICD-10-CM | POA: Diagnosis not present

## 2019-07-08 DIAGNOSIS — I499 Cardiac arrhythmia, unspecified: Secondary | ICD-10-CM | POA: Diagnosis not present

## 2019-07-08 DIAGNOSIS — E876 Hypokalemia: Secondary | ICD-10-CM | POA: Diagnosis not present

## 2019-07-08 DIAGNOSIS — E7849 Other hyperlipidemia: Secondary | ICD-10-CM | POA: Diagnosis not present

## 2019-07-08 DIAGNOSIS — U071 COVID-19: Secondary | ICD-10-CM | POA: Diagnosis not present

## 2019-07-08 DIAGNOSIS — I10 Essential (primary) hypertension: Secondary | ICD-10-CM | POA: Diagnosis not present

## 2019-07-10 ENCOUNTER — Telehealth: Payer: Self-pay | Admitting: Internal Medicine

## 2019-07-10 NOTE — Telephone Encounter (Signed)
Primary insurance coverage as BCBS COMM PPO.  Medicare as secondary. BCBS doesn't qualify tcm.  Please clarify if any change in viewing coverage.

## 2019-07-10 NOTE — Telephone Encounter (Signed)
Pt wife called about pt being hospitalized. Pt was admitted on 01/02-01/03 at [facility] in [place]. Dx was dehydration and + Covid. Pt has a HFU appt on 01/07 @ 01:30pm.

## 2019-07-12 ENCOUNTER — Other Ambulatory Visit: Payer: Self-pay

## 2019-07-12 ENCOUNTER — Ambulatory Visit (INDEPENDENT_AMBULATORY_CARE_PROVIDER_SITE_OTHER): Payer: PPO | Admitting: Internal Medicine

## 2019-07-12 ENCOUNTER — Encounter: Payer: Self-pay | Admitting: Internal Medicine

## 2019-07-12 VITALS — Ht 79.0 in | Wt 213.0 lb

## 2019-07-12 DIAGNOSIS — I10 Essential (primary) hypertension: Secondary | ICD-10-CM

## 2019-07-12 DIAGNOSIS — E876 Hypokalemia: Secondary | ICD-10-CM | POA: Diagnosis not present

## 2019-07-12 DIAGNOSIS — U071 COVID-19: Secondary | ICD-10-CM

## 2019-07-12 DIAGNOSIS — R768 Other specified abnormal immunological findings in serum: Secondary | ICD-10-CM | POA: Insufficient documentation

## 2019-07-12 HISTORY — DX: COVID-19: U07.1

## 2019-07-12 MED ORDER — AMLODIPINE BESYLATE 2.5 MG PO TABS: 2.5000 mg | ORAL_TABLET | Freq: Every day | ORAL | 3 refills | Status: DC

## 2019-07-12 NOTE — Patient Instructions (Addendum)
Automatic blood pressure cuff for adult for upper arm-amazon $25-35 Pulse oximeter $10 on amazon Blood pressure goal <130/<80 take norvasc 2.5 mg daily with losartan 50 mg daily    Hypokalemia Hypokalemia means that the amount of potassium in the blood is lower than normal. Potassium is a chemical (electrolyte) that helps regulate the amount of fluid in the body. It also stimulates muscle tightening (contraction) and helps nerves work properly. Normally, most of the body's potassium is inside cells, and only a very small amount is in the blood. Because the amount in the blood is so small, minor changes to potassium levels in the blood can be life-threatening. What are the causes? This condition may be caused by:  Antibiotic medicine.  Diarrhea or vomiting. Taking too much of a medicine that helps you have a bowel movement (laxative) can cause diarrhea and lead to hypokalemia.  Chronic kidney disease (CKD).  Medicines that help the body get rid of excess fluid (diuretics).  Eating disorders, such as bulimia.  Low magnesium levels in the body.  Sweating a lot. What are the signs or symptoms? Symptoms of this condition include:  Weakness.  Constipation.  Fatigue.  Muscle cramps.  Mental confusion.  Skipped heartbeats or irregular heartbeat (palpitations).  Tingling or numbness. How is this diagnosed? This condition is diagnosed with a blood test. How is this treated? This condition may be treated by:  Taking potassium supplements by mouth.  Adjusting the medicines that you take.  Eating more foods that contain a lot of potassium. If your potassium level is very low, you may need to get potassium through an IV and be monitored in the hospital. Follow these instructions at home:   Take over-the-counter and prescription medicines only as told by your health care provider. This includes vitamins and supplements.  Eat a healthy diet. A healthy diet includes fresh  fruits and vegetables, whole grains, healthy fats, and lean proteins.  If instructed, eat more foods that contain a lot of potassium. This includes: ? Nuts, such as peanuts and pistachios. ? Seeds, such as sunflower seeds and pumpkin seeds. ? Peas, lentils, and lima beans. ? Whole grain and bran cereals and breads. ? Fresh fruits and vegetables, such as apricots, avocado, bananas, cantaloupe, kiwi, oranges, tomatoes, asparagus, and potatoes. ? Orange juice. ? Tomato juice. ? Red meats. ? Yogurt.  Keep all follow-up visits as told by your health care provider. This is important. Contact a health care provider if you:  Have weakness that gets worse.  Feel your heart pounding or racing.  Vomit.  Have diarrhea.  Have diabetes (diabetes mellitus) and you have trouble keeping your blood sugar (glucose) in your target range. Get help right away if you:  Have chest pain.  Have shortness of breath.  Have vomiting or diarrhea that lasts for more than 2 days.  Faint. Summary  Hypokalemia means that the amount of potassium in the blood is lower than normal.  This condition is diagnosed with a blood test.  Hypokalemia may be treated by taking potassium supplements, adjusting the medicines that you take, or eating more foods that are high in potassium.  If your potassium level is very low, you may need to get potassium through an IV and be monitored in the hospital. This information is not intended to replace advice given to you by your health care provider. Make sure you discuss any questions you have with your health care provider. Document Revised: 02/01/2018 Document Reviewed: 02/01/2018 Elsevier Patient Education  2020 [employer]  

## 2019-07-12 NOTE — Progress Notes (Signed)
telephone Note  I connected with John Keith  on 07/12/19 at  1:40 PM EST by telephone and verified that I am speaking with the correct person using two identifiers.  Location patient: home Location provider:work or home office Persons participating in the virtual visit: patient, provider  I discussed the limitations of evaluation and management by telemedicine and the availability of in person appointments. The patient expressed understanding and agreed to proceed.   HPI: 1. covid + 07/07/19 he had loss of taste 07/06/19 which returned 07/07/19 in the pm labs hypona 133, hypoK 2.8 to 3.1 after pills, anemia 11.0/32.1 elevated troponin, CRP 26.9, CK 454. EKG with premature supraventricular contractions, increased LA size. He had leg weakness and low back pain worse 07/06/19 which made him go to [facility] ED. He had night sweats 1x but no fever, sob, chest pain and back pain is improved   ROS: See pertinent positives and negatives per HPI.  Past Medical History:  Diagnosis Date  . Allergy   . Arthritis   . Carpal tunnel syndrome on left   . CTS (carpal tunnel syndrome)    left  . DDD (degenerative disc disease), thoracolumbar    unknown location   . DDD (degenerative disc disease), thoracolumbar    multilevel  . Degenerative joint disease of left shoulder   . Degenerative joint disease of left shoulder    02/2011   . Diverticulosis    left colon (2008)   . ED (erectile dysfunction)    02/2011   . ED (erectile dysfunction)   . GERD (gastroesophageal reflux disease)   . H. pylori infection   . H. pylori infection   . Hyperlipidemia   . Hypertension   . Insomnia   . Lactose intolerance   . Lactose intolerance   . Low back pain   . Shingles    07/2018  . Smoking    smoking since age 49 y.o  . Toe fracture, left    4th in 2014  . Vitamin D deficiency   . Vitamin D deficiency     Past Surgical History:  Procedure Laterality Date  . APPENDECTOMY    . BACK SURGERY     x 2 10991 and  1995 in [place]   . CARDIAC CATHETERIZATION     1999 nl  . COLONOSCOPY     2008 diverticulosis  . ESOPHAGOGASTRODUODENOSCOPY     h/o +h. pylori  . OTHER SURGICAL HISTORY     heart catherization 1999 normal   . SPINE SURGERY     x 2     Family History  Problem Relation Age of Onset  . Heart disease Mother        age 61   . Throat cancer Father 62  . Cancer Father        throat   . Cancer Maternal Uncle        uncle ?maternal or paternal died colon cancer in his 9s   . Diabetes Maternal Aunt     SOCIAL HX:  Lives at home with wife     Current Outpatient Medications:  .  Cholecalciferol (VITAMIN D-3) 125 MCG (5000 UT) TABS, Take by mouth., Disp: , Rfl:  .  ezetimibe (ZETIA) 10 MG tablet, Take 1 tablet (10 mg total) by mouth daily., Disp: 90 tablet, Rfl: 3 .  losartan (COZAAR) 50 MG tablet, Take 1 tablet (50 mg total) by mouth daily. In am, Disp: 90 tablet, Rfl: 3 .  amLODipine (NORVASC) 2.5 MG  tablet, Take 1 tablet (2.5 mg total) by mouth daily. <130/<80, Disp: 90 tablet, Rfl: 3  EXAM:  VITALS per patient if applicable:  GENERAL: alert, oriented, appears well and in no acute distress  LUNGS:  no signs of respiratory distress, breathing rate appears normal, no obvious gross SOB, gasping or wheezing  CV: no obvious cyanosis  MS: moves all visible extremities without noticeable abnormality  PSYCH/NEURO: pleasant and cooperative, no obvious depression or anxiety, speech and thought processing grossly intact  ASSESSMENT AND PLAN:  Discussed the following assessment and plan:  COVID-19 virus detected rec mvt with vitamin D3 5000 iu daily taking centrum Mvt  cxr 07/07/19 negative  [facility] worse [facility]   Essential hypertension - Plan: amLODipine (NORVASC) 2.5 MG tablet to losartan 50 mg qd chlorthalidone 25 mg was stopped due to low K and dehydration with covid 19  Buy BP cuff   Hm Flu shotutd prevnar and pna 23 utd11/30/17 and 02/16/15 Tdap had 06/15/13   shingrix sent to pharmacy h/o shingles   Hep B immune  PSA3/30/2020 normal Hep C neg 06/03/16 D/c MMR check 2/2 cost a1c 5.3 06/11/19  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 Pt prefers to treat via telemedicine empirically rather then risking or undertaking an in person visit at this moment. Patient agrees to seek prompt in person care if worsening, new symptoms arise, or if is not improving with treatment.   I discussed the assessment and treatment plan with the patient. The patient was provided an opportunity to ask questions and all were answered. The patient agreed with the plan and demonstrated an understanding of the instructions.   The patient was advised to call back or seek an in-person evaluation if the symptoms worsen or if the condition fails to improve as anticipated.  Time spent 15-20 minutes  Delorise Jackson, MD

## 2019-07-13 ENCOUNTER — Ambulatory Visit: Payer: BC Managed Care – PPO | Admitting: Internal Medicine

## 2019-08-09 ENCOUNTER — Other Ambulatory Visit: Payer: Self-pay

## 2019-08-09 ENCOUNTER — Ambulatory Visit (INDEPENDENT_AMBULATORY_CARE_PROVIDER_SITE_OTHER): Payer: PPO

## 2019-08-09 VITALS — Ht 79.0 in | Wt 213.0 lb

## 2019-08-09 DIAGNOSIS — Z0000 Encounter for general adult medical examination without abnormal findings: Secondary | ICD-10-CM | POA: Diagnosis not present

## 2019-08-09 NOTE — Progress Notes (Signed)
Subjective:   John Keith is a 69 y.o. male who presents for an Initial Medicare Annual Wellness Visit.  Review of Systems  No ROS.  Medicare Wellness Virtual Visit.  Visual/audio telehealth visit, UTA vital signs.   Ht/Wt provided.  See social history for additional risk factors.   Cardiac Risk Factors include: advanced age (>74men, >44 women);male gender;hypertension    Objective:    Today's Vitals   08/09/19 0837  Weight: 213 lb (96.6 kg)  Height: 6\' 7"  (2.007 m)   Body mass index is 24 kg/m.  Advanced Directives 08/09/2019 11/01/2016 10/14/2016 06/03/2016 03/22/2016 03/16/2016 03/10/2016  Does Patient Have a Medical Advance Directive? No No No No No No No  Would patient like information on creating a medical advance directive? No - Patient declined No - Patient declined No - Patient declined No - Patient declined No - patient declined information No - patient declined information No - patient declined information  Some encounter information is confidential and restricted. Go to Review Flowsheets activity to see all data.    Current Medications (verified) Outpatient Encounter Medications as of 08/09/2019  Medication Sig  . amLODipine (NORVASC) 2.5 MG tablet Take 1 tablet (2.5 mg total) by mouth daily. <130/<80  . Cholecalciferol (VITAMIN D-3) 125 MCG (5000 UT) TABS Take by mouth.  . ezetimibe (ZETIA) 10 MG tablet Take 1 tablet (10 mg total) by mouth daily.  10/07/2019 losartan (COZAAR) 50 MG tablet Take 1 tablet (50 mg total) by mouth daily. In am   No facility-administered encounter medications on file as of 08/09/2019.    Allergies (verified) Crestor [rosuvastatin calcium], Lipitor [atorvastatin calcium], and Tomato   History: Past Medical History:  Diagnosis Date  . Allergy   . Arthritis   . Carpal tunnel syndrome on left   . CTS (carpal tunnel syndrome)    left  . DDD (degenerative disc disease), thoracolumbar    unknown location   . DDD (degenerative disc disease),  thoracolumbar    multilevel  . Degenerative joint disease of left shoulder   . Degenerative joint disease of left shoulder    02/2011   . Diverticulosis    left colon (2008)   . ED (erectile dysfunction)    02/2011   . ED (erectile dysfunction)   . GERD (gastroesophageal reflux disease)   . H. pylori infection   . H. pylori infection   . Hyperlipidemia   . Hypertension   . Insomnia   . Lactose intolerance   . Lactose intolerance   . Low back pain   . Shingles    07/2018  . Smoking    smoking since age 61 y.o  . Toe fracture, left    4th in 2014  . Vitamin D deficiency   . Vitamin D deficiency    Past Surgical History:  Procedure Laterality Date  . APPENDECTOMY    . BACK SURGERY     x 2 10991 and 1995 in [place]   . CARDIAC CATHETERIZATION     1999 nl  . COLONOSCOPY     2008 diverticulosis  . ESOPHAGOGASTRODUODENOSCOPY     h/o +h. pylori  . OTHER SURGICAL HISTORY     heart catherization 1999 normal   . SPINE SURGERY     x 2    Family History  Problem Relation Age of Onset  . Heart disease Mother        age 48   . Throat cancer Father 63  . Cancer Father  throat   . Cancer Maternal Uncle        uncle ?maternal or paternal died colon cancer in his 48s   . Diabetes Maternal Aunt    Social History   Socioeconomic History  . Marital status: Married    Spouse name: Not on file  . Number of children: Not on file  . Years of education: Not on file  . Highest education level: Not on file  Occupational History  . Not on file  Tobacco Use  . Smoking status: Current Every Day Smoker    Packs/day: 0.50    Start date: 07/06/1971  . Smokeless tobacco: Never Used  . Tobacco comment: cutting back  Substance and Sexual Activity  . Alcohol use: No    Alcohol/week: 0.0 standard drinks  . Drug use: No  . Sexual activity: Not on file  Other Topics Concern  . 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 Difficulty of Paying Living Expenses: Not hard at all  Food Insecurity: No Food Insecurity  . Worried About Charity fundraiser in the Last Year: Never true  . Ran Out of Food in the Last Year: Never true  Transportation Needs: No Transportation Needs  . Lack of Transportation (Medical): No  . Lack of Transportation (Non-Medical): No  Physical Activity: Sufficiently Active  . Days of Exercise per Week: 5 days  . Minutes of Exercise per Session: 30 min  Stress: No Stress Concern Present  . Feeling of Stress : Not at all  Social Connections: Unknown  . Frequency of Communication with Friends and Family: More than three times a week  . Frequency of Social Gatherings with Friends and Family: More than three times a week  . Attends Religious Services: Not on file  . Active Member of Clubs or Organizations: Yes  . Attends Archivist Meetings: Not on file  . Marital Status: Married   Tobacco Counseling Ready to quit: Not Answered Counseling given: Not Answered Comment: cutting back   Clinical Intake:  Pre-visit preparation completed: Yes        Diabetes: No  How often do you need to have someone help you when you read instructions, pamphlets, or other written materials from your doctor or pharmacy?: 1 - Never  Interpreter Needed?: No     Activities of Daily Living In your present state of health, do you have any difficulty performing the following activities: 08/09/2019 06/07/2019  Hearing? N N  Vision? N N  Difficulty concentrating or making decisions? N N  Walking or climbing stairs? N N  Dressing or bathing? N N  Doing errands, shopping? N N  Preparing Food and eating ? N -  Using the Toilet? N -  In the past six months, have you accidently leaked urine? N -  Do you have problems with loss of bowel control? N -    Managing your Medications? N -  Managing your Finances? N -  Housekeeping or managing your Housekeeping? N -  Some recent data might be hidden     Immunizations and Health Maintenance Immunization History  Administered Date(s) Administered  . Influenza, High Dose Seasonal PF 03/28/2019  . Influenza-Unspecified 05/07/2019  .  Pneumococcal Conjugate-13 06/03/2016  . Pneumococcal Polysaccharide-23 02/06/2015  . Tdap 06/15/2013  . Zoster Recombinat (Shingrix) 06/07/2019   Health Maintenance Due  Topic Date Due  . COLON CANCER SCREENING ANNUAL FOBT  08/13/2016  . COLONOSCOPY  11/28/2016    Patient Care Team: [name], Pasty Spillers, MD as PCP - General (Internal Medicine)  Indicate any recent Medical Services you may have received from other than [facility] providers in the past year (date may be approximate).    Assessment:   This is a routine wellness examination for Garnie.  Nurse connected with patient 08/09/19 at  8:30 AM EST by a telephone enabled telemedicine application and verified that I am speaking with the correct person using two identifiers. Patient stated full name and DOB. Patient gave permission to continue with virtual visit. Patient's location was at home and Nurse's location was at [place] office.   Patient is alert and oriented x3. Patient denies difficulty focusing or concentrating.  Health Maintenance Due: -Colonoscopy/Cologuard- discussed, plans to follow up with pcp at next visit.   See completed HM at the end of note.   Eye: Visual acuity not assessed. Virtual visit. Followed by their ophthalmologist.  Dental: Visits every 6 months.    Hearing: Demonstrates normal hearing during visit.  Safety:  Patient feels safe at home- yes Patient does have smoke detectors at home- yes Patient does wear sunscreen or protective clothing when in direct sunlight - yes Patient does wear seat belt when in a moving vehicle - yes Patient drives- yes Adequate lighting  in walkways free from debris- yes Grab bars and handrails used as appropriate- yes Ambulates with an assistive device- no  Social: Alcohol intake - no    Smoking history- current Smokers in home? none Illicit drug use? none  Medication: Taking as directed and without issues.  Self managed - yes   Covid-19: Precautions and sickness symptoms discussed. Wears mask, social distancing, hand hygiene as appropriate.   Activities of Daily Living Patient denies needing assistance with: household chores, feeding themselves, getting from bed to chair, getting to the toilet, bathing/showering, dressing, managing money, or preparing meals.   Discussed the importance of a healthy diet, water intake and the benefits of aerobic exercise.  Physical activity- walking .25 mile twice a week and works at a grill 3 days weekly.  Diet:  Regular Water: fair intake Caffeine:   Other Providers Patient Care Team: [name], Pasty Spillers, MD as PCP - General (Internal Medicine)  Hearing/Vision screen  Hearing Screening   125Hz  250Hz  500Hz  1000Hz  2000Hz  3000Hz  4000Hz  6000Hz  8000Hz   Right ear:           Left ear:           Comments: Patient is able to hear conversational tones without difficulty.  No issues reported.   Vision Screening Comments: Wears corrective lenses Visual acuity not assessed, virtual visit.  They have seen their ophthalmologist in the last 12 months.    Dietary issues and exercise activities discussed: Current Exercise Habits: Home exercise routine, Type of exercise: walking, Time (Minutes): 30, Frequency (Times/Week): 2, Weekly Exercise (Minutes/Week): 60, Intensity: Mild  Goals    . Blood Pressure < 140/90      Depression Screen PHQ 2/9 Scores 08/09/2019 07/12/2019 06/07/2019 11/01/2016  PHQ - 2 Score 0 0 0 0  Some encounter information is confidential and restricted. Go to Review Flowsheets activity to see all data.    Fall Risk Fall Risk  08/09/2019 07/12/2019 08/17/2018  11/01/2016 10/14/2016  Falls in the past year? 0 0 0 No No  Risk for fall due to : - - - - -  Follow up Falls evaluation completed - - - -  Some encounter information is confidential and restricted. Go to Review Flowsheets activity to see all data.    Timed Get Up and Go performed: no, virtual visit  Cognitive Function:     6CIT Screen 08/09/2019  What Year? 0 points  What month? 0 points  What time? 0 points  Count back from 20 0 points  Months in reverse 0 points  Repeat phrase 0 points  Total Score 0    Screening Tests Health Maintenance  Topic Date Due  . COLON CANCER SCREENING ANNUAL FOBT  08/13/2016  . COLONOSCOPY  11/28/2016  . TETANUS/TDAP  06/16/2023  . INFLUENZA VACCINE  Completed  . Hepatitis C Screening  Completed  . PNA vac Low Risk Adult  Completed       Plan:   Keep all routine maintenance appointments.   Follow up 10/09/19 @ 8:30  Monitor blood pressure as directed by pcp.  Medicare Attestation I have personally reviewed: The patient's medical and social history Their use of alcohol, tobacco or illicit drugs Their current medications and supplements The patient's functional ability including ADLs,fall risks, home safety risks, cognitive, and hearing and visual impairment Diet and physical activities Evidence for depression   I have reviewed and discussed with patient certain preventive protocols, quality metrics, and best practice recommendations.     Ashok Pall, LPN   08/12/4130

## 2019-08-09 NOTE — Patient Instructions (Addendum)
Mr. Bruschi , Thank you for taking time to come for your Medicare Wellness Visit. I appreciate your ongoing commitment to your health goals. Please review the following plan we discussed and let me know if I can assist you in the future.   These are the goals we discussed: Goals    . Blood Pressure < 140/90       This is a list of the screening recommended for you and due dates:  Health Maintenance  Topic Date Due  . Stool Blood Test  08/13/2016  . Colon Cancer Screening  11/28/2016  . Tetanus Vaccine  06/16/2023  . Flu Shot  Completed  .  Hepatitis C: One time screening is recommended by Center for Disease Control  (CDC) for  adults born from [birth year] through 1966.   Completed  . Pneumonia vaccines  Completed    Colonoscopy, Adult A colonoscopy is a procedure to look at the entire large intestine. This procedure is done using a long, thin, flexible tube that has a camera on the end. You may have a colonoscopy:  As a part of normal colorectal screening.  If you have certain symptoms, such as: ? A low number of red blood cells in your blood (anemia). ? Diarrhea that does not go away. ? Pain in your abdomen. ? Blood in your stool. A colonoscopy can help screen for and diagnose medical problems, including:  Tumors.  Extra tissue that grows where mucus forms (polyps).  Inflammation.  Areas of bleeding. Tell your health care provider about:  Any allergies you have.  All medicines you are taking, including vitamins, herbs, eye drops, creams, and over-the-counter medicines.  Any problems you or family members have had with anesthetic medicines.  Any blood disorders you have.  Any surgeries you have had.  Any medical conditions you have.  Any problems you have had with having bowel movements.  Whether you are pregnant or may be pregnant. What are the risks? Generally, this is a safe procedure. However, problems may occur, including:  Bleeding.  Damage to your  intestine.  Allergic reactions to medicines given during the procedure.  Infection. This is rare. What happens before the procedure? Eating and drinking restrictions Follow instructions from your health care provider about eating or drinking restrictions, which may include:  A few days before the procedure: ? Follow a low-fiber diet. ? Avoid nuts, seeds, dried fruit, raw fruits, and vegetables.  1-3 days before the procedure: ? Eat only gelatin dessert or ice pops. ? Drink only clear liquids, such as water, clear juice, clear broth or bouillon, black coffee or tea, or clear soft drinks or sports drinks. ? Avoid liquids that contain red or purple dye.  The day of the procedure: ? Do not eat solid foods. You may continue to drink clear liquids until up to 2 hours before the procedure. ? Do not eat or drink anything starting 2 hours before the procedure, or within the time period that your health care provider recommends. Bowel prep If you were prescribed a bowel prep to take by mouth (orally) to clean out your colon:  Take it as told by your health care provider. Starting the day before your procedure, you will need to drink a large amount of liquid medicine. The liquid will cause you to have many bowel movements of loose stool until your stool becomes almost clear or light green.  If your skin or the opening between the buttocks (anus) gets irritated from diarrhea, you may  relieve the irritation using: ? Wipes with medicine in them, such as adult wet wipes with aloe and vitamin E. ? A product to soothe skin, such as petroleum jelly.  If you vomit while drinking the bowel prep: ? Take a break for up to 60 minutes. ? Begin the bowel prep again. ? Call your health care provider if you keep vomiting or you cannot take the bowel prep without vomiting.  To clean out your colon, you may also be given: ? Laxative medicines. These help you have a bowel movement. ? Instructions for enema  use. An enema is liquid medicine injected into your rectum. Medicines Ask your health care provider about:  Changing or stopping your regular medicines or supplements. This is especially important if you are taking iron supplements, diabetes medicines, or blood thinners.  Taking medicines such as aspirin and ibuprofen. These medicines can thin your blood. Do not take these medicines unless your health care provider tells you to take them.  Taking over-the-counter medicines, vitamins, herbs, and supplements. General instructions  Ask your health care provider what steps will be taken to help prevent infection. These may include washing skin with a germ-killing soap.  Plan to have someone take you home from the hospital or clinic. What happens during the procedure?   An IV will be inserted into one of your veins.  You may be given one or more of the following: ? A medicine to help you relax (sedative). ? A medicine to numb the area (local anesthetic). ? A medicine to make you fall asleep (general anesthetic). This is rarely needed.  You will lie on your side with your knees bent.  The tube will: ? Have oil or gel put on it (be lubricated). ? Be inserted into your anus. ? Be gently eased through all parts of your large intestine.  Air will be sent into your colon to keep it open. This may cause some pressure or cramping.  Images will be taken with the camera and will appear on a screen.  A small tissue sample may be removed to be looked at under a microscope (biopsy). The tissue may be sent to a lab for testing if any signs of problems are found.  If small polyps are found, they may be removed and checked for cancer cells.  When the procedure is finished, the tube will be removed. The procedure may vary among health care providers and hospitals. What happens after the procedure?  Your blood pressure, heart rate, breathing rate, and blood oxygen level will be monitored until  you leave the hospital or clinic.  You may have a small amount of blood in your stool.  You may pass gas and have mild cramping or bloating in your abdomen. This is caused by the air that was used to open your colon during the exam.  Do not drive for 24 hours after the procedure.  It is up to you to get the results of your procedure. Ask your health care provider, or the department that is doing the procedure, when your results will be ready. Summary  A colonoscopy is a procedure to look at the entire large intestine.  Follow instructions from your health care provider about eating and drinking before the procedure.  If you were prescribed an oral bowel prep to clean out your colon, take it as told by your health care provider.  During the colonoscopy, a flexible tube with a camera on its end is inserted into the  anus and then passed into the other parts of the large intestine. This information is not intended to replace advice given to you by your health care provider. Make sure you discuss any questions you have with your health care provider. Document Revised: 01/12/2019 Document Reviewed: 01/12/2019 Elsevier Patient Education  [place].

## 2019-09-03 ENCOUNTER — Other Ambulatory Visit: Payer: Self-pay | Admitting: Internal Medicine

## 2019-09-03 ENCOUNTER — Telehealth: Payer: Self-pay | Admitting: Internal Medicine

## 2019-09-03 DIAGNOSIS — R030 Elevated blood-pressure reading, without diagnosis of hypertension: Secondary | ICD-10-CM | POA: Diagnosis not present

## 2019-09-03 DIAGNOSIS — I10 Essential (primary) hypertension: Secondary | ICD-10-CM | POA: Diagnosis not present

## 2019-09-03 DIAGNOSIS — R778 Other specified abnormalities of plasma proteins: Secondary | ICD-10-CM | POA: Diagnosis not present

## 2019-09-03 DIAGNOSIS — F17210 Nicotine dependence, cigarettes, uncomplicated: Secondary | ICD-10-CM | POA: Diagnosis not present

## 2019-09-03 DIAGNOSIS — R001 Bradycardia, unspecified: Secondary | ICD-10-CM | POA: Diagnosis not present

## 2019-09-03 DIAGNOSIS — Z6823 Body mass index (BMI) 23.0-23.9, adult: Secondary | ICD-10-CM | POA: Diagnosis not present

## 2019-09-03 MED ORDER — LOSARTAN POTASSIUM 100 MG PO TABS: 100.0000 mg | ORAL_TABLET | Freq: Every day | ORAL | 3 refills | Status: DC

## 2019-09-03 MED ORDER — AMLODIPINE BESYLATE 5 MG PO TABS: 5.0000 mg | ORAL_TABLET | Freq: Every day | ORAL | 3 refills | Status: DC

## 2019-09-03 NOTE — Telephone Encounter (Signed)
FYI

## 2019-09-03 NOTE — Telephone Encounter (Signed)
Goal BP <130/<80 He is to increase losartan to 100 mg daily for the next 3 days sent 100 mg pill 1 pill to his pharmacy  If he has any 50s he can take 2 and next dose 1 100 mg losartan   Then day 4 increased norvasc to 5 mg daily   Permanently should be on losartan 100 mg daily and norvasc 5 mg daily starting day 4   Monitor BP goal <130/<80 take meds and BP check 2 hours after meds  Call back in 09/11/19 and let me know about BP readings   TMS

## 2019-09-03 NOTE — Telephone Encounter (Signed)
Patient was advised by Dr. Shirlee Latch to buy a bp monitor. He has been taking his bp since last Thursday. His bp has been ranging from 222/166, pulse 87 this was taken a few hours ago. Yesterday it was 195/158, pulse 66. Patietn was transferred to Access Nurse, operator French Ana.

## 2019-09-03 NOTE — Telephone Encounter (Signed)
For your information  

## 2019-09-04 NOTE — Telephone Encounter (Signed)
Spoke with patient's wife and informed her of all the below. She verbalized understanding and wrote all of this down. Patient's wife then repeated the instructions to me for verification. She states the patient has gone to pick up what was sent in.   They will be starting this new regimen with today being day 1. They will keep track of patient's BP and let our office know on 03/09 when patient's wife comes in for her b12 injection that day.

## 2019-09-05 ENCOUNTER — Ambulatory Visit: Payer: PPO | Admitting: Podiatry

## 2019-09-05 ENCOUNTER — Other Ambulatory Visit: Payer: Self-pay

## 2019-09-05 ENCOUNTER — Other Ambulatory Visit: Payer: Self-pay | Admitting: Podiatry

## 2019-09-05 ENCOUNTER — Ambulatory Visit (INDEPENDENT_AMBULATORY_CARE_PROVIDER_SITE_OTHER): Payer: PPO

## 2019-09-05 ENCOUNTER — Encounter: Payer: Self-pay | Admitting: Podiatry

## 2019-09-05 DIAGNOSIS — M7752 Other enthesopathy of left foot: Secondary | ICD-10-CM

## 2019-09-05 DIAGNOSIS — M722 Plantar fascial fibromatosis: Secondary | ICD-10-CM

## 2019-09-05 MED ORDER — MELOXICAM 15 MG PO TABS: 15.0000 mg | ORAL_TABLET | Freq: Every day | ORAL | 1 refills | Status: DC

## 2019-09-05 NOTE — Patient Instructions (Signed)

## 2019-09-08 NOTE — Progress Notes (Signed)
   Subjective: 69 y.o. male presenting today with a chief complaint to the left plantar heel that began 1-2 weeks ago. He states the pain is worse in the morning. He has not done anything for treatment and denies modifying factors. Patient is here for further evaluation and treatment.   Past Medical History:  Diagnosis Date  . Allergy   . Arthritis   . Carpal tunnel syndrome on left   . CTS (carpal tunnel syndrome)    left  . DDD (degenerative disc disease), thoracolumbar    unknown location   . DDD (degenerative disc disease), thoracolumbar    multilevel  . Degenerative joint disease of left shoulder   . Degenerative joint disease of left shoulder    02/2011   . Diverticulosis    left colon (2008)   . ED (erectile dysfunction)    02/2011   . ED (erectile dysfunction)   . GERD (gastroesophageal reflux disease)   . H. pylori infection   . H. pylori infection   . Hyperlipidemia   . Hypertension   . Insomnia   . Lactose intolerance   . Lactose intolerance   . Low back pain   . Shingles    07/2018  . Smoking    smoking since age 59 y.o  . Toe fracture, left    4th in 2014  . Vitamin D deficiency   . Vitamin D deficiency      Objective: Physical Exam General: The patient is alert and oriented x3 in no acute distress.  Dermatology: Skin is warm, dry and supple bilateral lower extremities. Negative for open lesions or macerations bilateral.   Vascular: Dorsalis Pedis and Posterior Tibial pulses palpable bilateral.  Capillary fill time is immediate to all digits.  Neurological: Epicritic and protective threshold intact bilateral.   Musculoskeletal: Tenderness to palpation to the plantar aspect of the left heel along the plantar fascia. All other joints range of motion within normal limits bilateral. Strength 5/5 in all groups bilateral.   Radiographic exam: Normal osseous mineralization. Joint spaces preserved. No fracture/dislocation/boney destruction. No other soft  tissue abnormalities or radiopaque foreign bodies.   Assessment: 1. Plantar fasciitis left foot  Plan of Care:  1. Patient evaluated. Xrays reviewed.   2. Injection of 0.5cc Celestone soluspan injected into the left plantar fascia.  3. Rx for Meloxicam ordered for patient. 4. Plantar fascial band(s) dispensed  5. Instructed patient regarding therapies and modalities at home to alleviate symptoms.  6. Return to clinic as needed.  Retired. 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      Felecia Shelling, DPM [facility]  Dr. Felecia Shelling, DPM    [address] [place], [place] [address]                Office [phone]  Fax [phone]

## 2019-09-11 ENCOUNTER — Telehealth: Payer: Self-pay | Admitting: Internal Medicine

## 2019-09-11 NOTE — Telephone Encounter (Signed)
Paper placed on provider desk

## 2019-09-11 NOTE — Telephone Encounter (Signed)
Pt wife dropped off pt BP readings  Placed in Dr. French Ana color folder upfront

## 2019-09-12 ENCOUNTER — Telehealth: Payer: Self-pay | Admitting: Internal Medicine

## 2019-09-12 NOTE — Telephone Encounter (Signed)
BP too high goal <130/<80  Is he taking norvasc 5 mg daily and losartan 100 mg daily?  If so we need to add back fluid pill is he ok with this?    hctz or chlorthalidone will be ok to add back ?  Is he agreeable?

## 2019-09-12 NOTE — Telephone Encounter (Signed)
Patient states he is taking the Amlodipine and Losartan. He is having some lightheadedness.  Patient is agreeable to starting a fluid pill again; states he has no preference.  Preferred pharmacy is [facility].   Patient will pick this med up first thing in the morning. Informed patient that should his symptoms worsen he needs to go to the ER or Urgent Care. Patient verbalized understanding. He will check his BP 2 hours after taking meds and update [place] in a week.

## 2019-09-13 ENCOUNTER — Other Ambulatory Visit: Payer: Self-pay | Admitting: Internal Medicine

## 2019-09-13 DIAGNOSIS — I10 Essential (primary) hypertension: Secondary | ICD-10-CM

## 2019-09-13 MED ORDER — CHLORTHALIDONE 25 MG PO TABS: 12.5000 mg | ORAL_TABLET | Freq: Every day | ORAL | 2 refills | Status: DC

## 2019-09-24 DIAGNOSIS — H2513 Age-related nuclear cataract, bilateral: Secondary | ICD-10-CM | POA: Diagnosis not present

## 2019-09-27 DIAGNOSIS — R42 Dizziness and giddiness: Secondary | ICD-10-CM | POA: Diagnosis not present

## 2019-09-27 DIAGNOSIS — R197 Diarrhea, unspecified: Secondary | ICD-10-CM | POA: Diagnosis not present

## 2019-09-27 DIAGNOSIS — I10 Essential (primary) hypertension: Secondary | ICD-10-CM | POA: Diagnosis not present

## 2019-09-27 DIAGNOSIS — F17210 Nicotine dependence, cigarettes, uncomplicated: Secondary | ICD-10-CM | POA: Diagnosis not present

## 2019-09-27 DIAGNOSIS — Z6824 Body mass index (BMI) 24.0-24.9, adult: Secondary | ICD-10-CM | POA: Diagnosis not present

## 2019-09-27 DIAGNOSIS — K921 Melena: Secondary | ICD-10-CM | POA: Diagnosis not present

## 2019-09-27 DIAGNOSIS — R1013 Epigastric pain: Secondary | ICD-10-CM | POA: Diagnosis not present

## 2019-09-28 ENCOUNTER — Other Ambulatory Visit: Payer: Self-pay

## 2019-09-28 ENCOUNTER — Ambulatory Visit (INDEPENDENT_AMBULATORY_CARE_PROVIDER_SITE_OTHER): Payer: PPO | Admitting: Internal Medicine

## 2019-09-28 ENCOUNTER — Encounter: Payer: Self-pay | Admitting: Internal Medicine

## 2019-09-28 VITALS — BP 122/82 | HR 82 | Temp 97.7°F | Ht 79.0 in | Wt 234.4 lb

## 2019-09-28 DIAGNOSIS — R109 Unspecified abdominal pain: Secondary | ICD-10-CM

## 2019-09-28 DIAGNOSIS — E785 Hyperlipidemia, unspecified: Secondary | ICD-10-CM | POA: Diagnosis not present

## 2019-09-28 DIAGNOSIS — R1030 Lower abdominal pain, unspecified: Secondary | ICD-10-CM

## 2019-09-28 DIAGNOSIS — I10 Essential (primary) hypertension: Secondary | ICD-10-CM | POA: Diagnosis not present

## 2019-09-28 DIAGNOSIS — R319 Hematuria, unspecified: Secondary | ICD-10-CM

## 2019-09-28 DIAGNOSIS — K259 Gastric ulcer, unspecified as acute or chronic, without hemorrhage or perforation: Secondary | ICD-10-CM | POA: Insufficient documentation

## 2019-09-28 DIAGNOSIS — R1032 Left lower quadrant pain: Secondary | ICD-10-CM | POA: Diagnosis not present

## 2019-09-28 DIAGNOSIS — R778 Other specified abnormalities of plasma proteins: Secondary | ICD-10-CM

## 2019-09-28 DIAGNOSIS — R079 Chest pain, unspecified: Secondary | ICD-10-CM | POA: Diagnosis not present

## 2019-09-28 MED ORDER — PANTOPRAZOLE SODIUM 40 MG PO TBEC: 40.0000 mg | DELAYED_RELEASE_TABLET | Freq: Every day | ORAL | 3 refills | Status: DC

## 2019-09-28 MED ORDER — LOSARTAN POTASSIUM 25 MG PO TABS
25.00 | ORAL_TABLET | ORAL | Status: DC
Start: 2019-09-28 — End: 2019-09-28

## 2019-09-28 NOTE — Progress Notes (Addendum)
Chief Complaint  Patient presents with  . Hospitalization Follow-up   ED F/u uncontrolled HTN and dizziness [facility]  1. HTN on chlorathalidone 12.5 mg qd, norvasc 5 mg qd, losartan 100 mg qd  BP at homes 177/108, 168 yesterday in ED 114/88 on meds givne norvasc 10 mg, losartan 50 mg qd, protonix  tropoin was elevated 0.48 then 0.34 elevated  ED visit [facility] 09/03/19 for elevated BP 160/108 ED visit [facility] 09/27/19 for elevated BP 161/92 went down to 114/80 w/o meds in the ED then given meds listed upcoming before discharge, severe epigastric pain h/o gastric ulcer years ago, elevated tropronin 0.042 09/03/19 and 09/27/19 0.034 given protonix 40 mg qd, losartan 25 mg qd and norvasc 2.5 mg x 1  He has a wrist cuff he is using to check BP at home 2. Epigastric and low ab pain h/o GI ulcer worse x 2 weeks tried nexium otc and given protonix 40 mg yesterday and helped will refer to GI for  3. Intermittent cp mid chest he is a smoker and has had elevated troponin since 07/2019 he is a smoker   Review of Systems  Constitutional: Negative for weight loss.  HENT: Negative for hearing loss.   Eyes: Negative for blurred vision.  Respiratory: Negative for shortness of breath.   Cardiovascular: Positive for chest pain.  Gastrointestinal: Positive for abdominal pain. Negative for constipation and diarrhea.  Musculoskeletal: Negative for falls.  Skin: Negative for rash.  Neurological: Negative for dizziness and headaches.  Psychiatric/Behavioral: Negative for depression.   Past Medical History:  Diagnosis Date  . Allergy   . Arthritis   . Carpal tunnel syndrome on left   . CTS (carpal tunnel syndrome)    left  . DDD (degenerative disc disease), thoracolumbar    unknown location   . DDD (degenerative disc disease), thoracolumbar    multilevel  . Degenerative joint disease of left shoulder   . Degenerative joint disease of left shoulder    02/2011   . Diverticulosis    left colon  (2008)   . ED (erectile dysfunction)    02/2011   . ED (erectile dysfunction)   . GERD (gastroesophageal reflux disease)   . H. pylori infection   . H. pylori infection   . Hyperlipidemia   . Hypertension   . Insomnia   . Lactose intolerance   . Lactose intolerance   . Low back pain   . Shingles    07/2018  . Smoking    smoking since age 53 y.o  . Toe fracture, left    4th in 2014  . Vitamin D deficiency   . Vitamin D deficiency    Past Surgical History:  Procedure Laterality Date  . APPENDECTOMY    . BACK SURGERY     x 2 10991 and 1995 in [place]   . CARDIAC CATHETERIZATION     1999 nl  . COLONOSCOPY     2008 diverticulosis  . ESOPHAGOGASTRODUODENOSCOPY     h/o +h. pylori  . OTHER SURGICAL HISTORY     heart catherization 1999 normal   . SPINE SURGERY     x 2    Family History  Problem Relation Age of Onset  . Heart disease Mother        age 25   . Throat cancer Father 17  . Cancer Father        throat   . Cancer Maternal Uncle  uncle ?maternal or paternal died colon cancer in his 43s   . Diabetes Maternal Aunt    Social History   Socioeconomic History  . Marital status: Married    Spouse name: Not on file  . Number of children: Not on file  . Years of education: Not on file  . Highest education level: Not on file  Occupational History  . Not on file  Tobacco Use  . Smoking status: Current Every Day Smoker    Packs/day: 0.50    Start date: 07/06/1971  . Smokeless tobacco: Never Used  . Tobacco comment: cutting back  Substance and Sexual Activity  . Alcohol use: No    Alcohol/week: 0.0 standard drinks  . Drug use: No  . Sexual activity: Not on file  Other Topics Concern  . 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 Difficulty of Paying Living  Expenses: Not hard at all  Food Insecurity: No Food Insecurity  . Worried About Charity fundraiser in the Last Year: Never true  . Ran Out of Food in the Last Year: Never true  Transportation Needs: No Transportation Needs  . Lack of Transportation (Medical): No  . Lack of Transportation (Non-Medical): No  Physical Activity: Sufficiently Active  . Days of Exercise per Week: 5 days  . Minutes of Exercise per Session: 30 min  Stress: No Stress Concern Present  . Feeling of Stress : Not at all  Social Connections: Unknown  . Frequency of Communication with Friends and Family: More than three times a week  . Frequency of Social Gatherings with Friends and Family: More than three times a week  . Attends Religious Services: Not on file  . Active Member of Clubs or Organizations: Yes  . Attends Archivist Meetings: Not on file  . Marital Status: Married  Human resources officer Violence: Not At Risk  . Fear of Current or Ex-Partner: No  . Emotionally Abused: No  . Physically Abused: No  . Sexually Abused: No   Current Meds  Medication Sig  . amLODipine (NORVASC) 5 MG tablet Take 1 tablet (5 mg total) by mouth daily. Goal BP less than 130 (top)/less than 80 (bottom)  . chlorthalidone (HYGROTON) 25 MG tablet Take 0.5 tablets (12.5 mg total) by mouth daily. In am If BP not <130/<80 contact office  . Cholecalciferol (VITAMIN D-3) 125 MCG (5000 UT) TABS Take by mouth.  . ezetimibe (ZETIA) 10 MG tablet Take 1 tablet (10 mg total) by mouth daily.  Marland Kitchen losartan (COZAAR) 100 MG tablet Take 1 tablet (100 mg total) by mouth daily. In am  . [DISCONTINUED] meloxicam (MOBIC) 15 MG tablet Take 1 tablet (15 mg total) by mouth daily.   Allergies  Allergen Reactions  . Crestor [Rosuvastatin Calcium]     Elevated muscle enzymes   . Lipitor [Atorvastatin Calcium]     Elevated muscle enzymes   . Tomato Itching   No results found for this or any previous visit (from the past 2160  hour(s)). Objective  Body mass index is 26.41 kg/m. Wt Readings from Last 3 Encounters:  09/28/19 234 lb 6.4 oz (106.3 kg)  08/09/19 213 lb (96.6 kg)  07/12/19 213 lb (  96.6 kg)   Temp Readings from Last 3 Encounters:  09/28/19 97.7 F (36.5 C) (Temporal)  02/19/19 (!) 97.4 F (36.3 C)  01/22/19 98.3 F (36.8 C)   BP Readings from Last 3 Encounters:  09/28/19 122/82  09/05/18 (!) 142/90  08/30/18 120/84   Pulse Readings from Last 3 Encounters:  09/28/19 82  09/05/18 91  08/30/18 (!) 103    Physical Exam Vitals reviewed.  Constitutional:      Appearance: Normal appearance. He is well-developed and well-groomed.  HENT:     Head: Normocephalic and atraumatic.  Eyes:     Conjunctiva/sclera: Conjunctivae normal.     Pupils: Pupils are equal, round, and reactive to light.  Cardiovascular:     Rate and Rhythm: Normal rate and regular rhythm.     Heart sounds: Normal heart sounds. No murmur.  Pulmonary:     Effort: Pulmonary effort is normal.     Breath sounds: Normal breath sounds.  Abdominal:     General: Bowel sounds are normal.     Palpations: Abdomen is soft.     Tenderness: There is no abdominal tenderness.  Skin:    General: Skin is warm and dry.  Neurological:     General: No focal deficit present.     Mental Status: He is alert and oriented to person, place, and time. Mental status is at baseline.     Gait: Gait normal.  Psychiatric:        Attention and Perception: Attention and perception normal.        Mood and Affect: Mood and affect normal.        Speech: Speech normal.        Behavior: Behavior normal. Behavior is cooperative.        Thought Content: Thought content normal.        Cognition and Memory: Cognition and memory normal.        Judgment: Judgment normal.     Assessment  Plan  Chest pain, unspecified type - Plan: Ambulatory referral to Cardiology further w/u   Essential hypertension - Plan: Ambulatory referral to Cardiology Cont meds  controlled better today   Hyperlipidemia, unspecified hyperlipidemia type - Plan: Ambulatory referral to Cardiology  Gastric ulcer without hemorrhage or perforation, unspecified chronicity - Plan: Ambulatory referral to Gastroenterology, pantoprazole (PROTONIX) 40 MG tablet, CT Abdomen Pelvis Wo Contrast Refer Gi Dr. Octavia Bruckner Meisenheimer  Abdominal pain, unspecified abdominal location - Plan: Ambulatory referral to Gastroenterology, pantoprazole (PROTONIX) 40 MG tablet, CT Abdomen Pelvis Wo Contrast  Lower abdominal pain - Plan: CT Abdomen Pelvis Wo Contrast   Hm Flu shotutd prevnar and pna 23 utd11/30/17 and 02/16/15 Tdap had 06/15/13  shingrixsent to pharmacy h/o shingles covid vx will get   Hep B immune  PSA3/30/2020 normal -need to check in futur e Hep C neg 06/03/16 D/c MMR check 2/2 cost a1c 5.3 06/11/19  [facility] [facility]   Tobacco abuse long term-rec smoking cessation1/2 ppd since age 13 y.o max 1 ppd  Provider: Dr. Olivia Mackie [name]-Internal Medicine

## 2019-09-28 NOTE — Patient Instructions (Addendum)
Purchase a pill cutter   Order amazon Upper arm BP automatic cuff adult size  Pulse oximeter to check Oxygen   norvasc 5 mg take at night  All other bottles take in the am with food   [facility] for CAT scan abdomen [address] [place]   Debrox ear wax drops   No covid 19 vaccine until after 10/05/19   COVID-19 Vaccine Information can be found at: [email or web address] For questions related to vaccine distribution or appointments, please email [email or web address] or call [phone].   DASH Eating Plan DASH stands for "Dietary Approaches to Stop Hypertension." The DASH eating plan is a healthy eating plan that has been shown to reduce high blood pressure (hypertension). It may also reduce your risk for type 2 diabetes, heart disease, and stroke. The DASH eating plan may also help with weight loss. What are tips for following this plan?  General guidelines  Avoid eating more than 2,300 mg (milligrams) of salt (sodium) a day. If you have hypertension, you may need to reduce your sodium intake to 1,500 mg a day.  Limit alcohol intake to no more than 1 drink a day for nonpregnant women and 2 drinks a day for men. One drink equals 12 oz of beer, 5 oz of wine, or 1 oz of hard liquor.  Work with your health care provider to maintain a healthy body weight or to lose weight. Ask what an ideal weight is for you.  Get at least 30 minutes of exercise that causes your heart to beat faster (aerobic exercise) most days of the week. Activities may include walking, swimming, or biking.  Work with your health care provider or diet and nutrition specialist (dietitian) to adjust your eating plan to your individual calorie needs. Reading food labels   Check food labels for the amount of sodium per serving. Choose foods with less than 5 percent of the Daily Value of sodium. Generally, foods with less than 300 mg of sodium per  serving fit into this eating plan.  To find whole grains, look for the word "whole" as the first word in the ingredient list. Shopping  Buy products labeled as "low-sodium" or "no salt added."  Buy fresh foods. Avoid canned foods and premade or frozen meals. Cooking  Avoid adding salt when cooking. Use salt-free seasonings or herbs instead of table salt or sea salt. Check with your health care provider or pharmacist before using salt substitutes.  Do not fry foods. Cook foods using healthy methods such as baking, boiling, grilling, and broiling instead.  Cook with heart-healthy oils, such as olive, canola, soybean, or sunflower oil. Meal planning  Eat a balanced diet that includes: ? 5 or more servings of fruits and vegetables each day. At each meal, try to fill half of your plate with fruits and vegetables. ? Up to 6-8 servings of whole grains each day. ? Less than 6 oz of lean meat, poultry, or fish each day. A 3-oz serving of meat is about the same size as a deck of cards. One egg equals 1 oz. ? 2 servings of low-fat dairy each day. ? A serving of nuts, seeds, or beans 5 times each week. ? Heart-healthy fats. Healthy fats called Omega-3 fatty acids are found in foods such as flaxseeds and coldwater fish, like sardines, salmon, and mackerel.  Limit how much you eat of the following: ? Canned or prepackaged foods. ? Food that is high in trans fat, such as fried foods. ? Food  that is high in saturated fat, such as fatty meat. ? Sweets, desserts, sugary drinks, and other foods with added sugar. ? Full-fat dairy products.  Do not salt foods before eating.  Try to eat at least 2 vegetarian meals each week.  Eat more home-cooked food and less restaurant, buffet, and fast food.  When eating at a restaurant, ask that your food be prepared with less salt or no salt, if possible. What foods are recommended? The items listed may not be a complete list. Talk with your dietitian about  what dietary choices are best for you. Grains Whole-grain or whole-wheat bread. Whole-grain or whole-wheat pasta. Brown rice. [name]. Bulgur. Whole-grain and low-sodium cereals. Pita bread. Low-fat, low-sodium crackers. Whole-wheat flour tortillas. Vegetables Fresh or frozen vegetables (raw, steamed, roasted, or grilled). Low-sodium or reduced-sodium tomato and vegetable juice. Low-sodium or reduced-sodium tomato sauce and tomato paste. Low-sodium or reduced-sodium canned vegetables. Fruits All fresh, dried, or frozen fruit. Canned fruit in natural juice (without added sugar). Meat and other protein foods Skinless chicken or [place]. Ground chicken or [place]. Pork with fat trimmed off. Fish and seafood. Egg whites. Dried beans, peas, or lentils. Unsalted nuts, nut butters, and seeds. Unsalted canned beans. Lean cuts of beef with fat trimmed off. Low-sodium, lean deli meat. Dairy Low-fat (1%) or fat-free (skim) milk. Fat-free, low-fat, or reduced-fat cheeses. Nonfat, low-sodium ricotta or cottage cheese. Low-fat or nonfat yogurt. Low-fat, low-sodium cheese. Fats and oils Soft margarine without trans fats. Vegetable oil. Low-fat, reduced-fat, or light mayonnaise and salad dressings (reduced-sodium). Canola, safflower, olive, soybean, and sunflower oils. Avocado. Seasoning and other foods Herbs. Spices. Seasoning mixes without salt. Unsalted popcorn and pretzels. Fat-free sweets. What foods are not recommended? The items listed may not be a complete list. Talk with your dietitian about what dietary choices are best for you. Grains Baked goods made with fat, such as croissants, muffins, or some breads. Dry pasta or rice meal packs. Vegetables Creamed or fried vegetables. Vegetables in a cheese sauce. Regular canned vegetables (not low-sodium or reduced-sodium). Regular canned tomato sauce and paste (not low-sodium or reduced-sodium). Regular tomato and vegetable juice (not low-sodium or  reduced-sodium). [name]. Olives. Fruits Canned fruit in a light or heavy syrup. Fried fruit. Fruit in cream or butter sauce. Meat and other protein foods Fatty cuts of meat. Ribs. Fried meat. [name]. Sausage. Bologna and other processed lunch meats. Salami. Fatback. Hotdogs. Bratwurst. Salted nuts and seeds. Canned beans with added salt. Canned or smoked fish. Whole eggs or egg yolks. Chicken or [place] with skin. Dairy Whole or 2% milk, cream, and half-and-half. Whole or full-fat cream cheese. Whole-fat or sweetened yogurt. Full-fat cheese. Nondairy creamers. Whipped toppings. Processed cheese and cheese spreads. Fats and oils Butter. Stick margarine. Lard. Shortening. Ghee. Bacon fat. Tropical oils, such as coconut, palm kernel, or palm oil. Seasoning and other foods Salted popcorn and pretzels. Onion salt, garlic salt, seasoned salt, table salt, and sea salt. Worcestershire sauce. Tartar sauce. Barbecue sauce. Teriyaki sauce. Soy sauce, including reduced-sodium. Steak sauce. Canned and packaged gravies. Fish sauce. Oyster sauce. Cocktail sauce. Horseradish that you find on the shelf. Ketchup. Mustard. Meat flavorings and tenderizers. Bouillon cubes. Hot sauce and Tabasco sauce. Premade or packaged marinades. Premade or packaged taco seasonings. Relishes. Regular salad dressings. Where to find more information:  National Heart, Lung, and [place]: https://wilson-eaton.com/  American Heart Association: www.heart.org Summary  The DASH eating plan is a healthy eating plan that has been shown to reduce high blood pressure (  hypertension). It may also reduce your risk for type 2 diabetes, heart disease, and stroke.  With the DASH eating plan, you should limit salt (sodium) intake to 2,300 mg a day. If you have hypertension, you may need to reduce your sodium intake to 1,500 mg a day.  When on the DASH eating plan, aim to eat more fresh fruits and vegetables, whole grains, lean proteins, low-fat  dairy, and heart-healthy fats.  Work with your health care provider or diet and nutrition specialist (dietitian) to adjust your eating plan to your individual calorie needs. This information is not intended to replace advice given to you by your health care provider. Make sure you discuss any questions you have with your health care provider. Document Revised: 06/03/2017 Document Reviewed: 06/14/2016 Elsevier Patient Education  2020 [employer].

## 2019-10-01 ENCOUNTER — Other Ambulatory Visit: Payer: PPO

## 2019-10-02 ENCOUNTER — Telehealth: Payer: Self-pay | Admitting: Internal Medicine

## 2019-10-02 NOTE — Telephone Encounter (Signed)
Pt states that Dr. Camila Li office will not schedule appt until they have notes from our office.

## 2019-10-03 ENCOUNTER — Encounter: Payer: Self-pay | Admitting: Internal Medicine

## 2019-10-09 ENCOUNTER — Telehealth: Payer: Self-pay | Admitting: Internal Medicine

## 2019-10-09 ENCOUNTER — Ambulatory Visit: Payer: BC Managed Care – PPO | Admitting: Internal Medicine

## 2019-10-10 ENCOUNTER — Ambulatory Visit
Admission: RE | Admit: 2019-10-10 | Discharge: 2019-10-10 | Disposition: A | Discharge status: Home / Self Care | Payer: PPO | Source: Ambulatory Visit | Attending: Internal Medicine | Admitting: Internal Medicine

## 2019-10-10 DIAGNOSIS — R1030 Lower abdominal pain, unspecified: Secondary | ICD-10-CM

## 2019-10-10 DIAGNOSIS — K259 Gastric ulcer, unspecified as acute or chronic, without hemorrhage or perforation: Secondary | ICD-10-CM

## 2019-10-10 DIAGNOSIS — R109 Unspecified abdominal pain: Secondary | ICD-10-CM

## 2019-10-10 DIAGNOSIS — K5730 Diverticulosis of large intestine without perforation or abscess without bleeding: Secondary | ICD-10-CM | POA: Diagnosis not present

## 2019-10-11 ENCOUNTER — Telehealth: Payer: Self-pay | Admitting: Internal Medicine

## 2019-10-11 NOTE — Telephone Encounter (Signed)
Pt now reporting bloody urine with left groin pain. States he thinks he may be bleeding internally.  Spoke with Dr French Ana and she okayed urgent Urology referral to [place].  Pt sates bleeding started last night. I informed him referral being sent and to go to ER should bleeding or pain worsens.

## 2019-10-11 NOTE — Telephone Encounter (Signed)
Tilford Pillar, [facility]  10/10/2019 4:42 PM EDT    Patient informed and verbalized understanding.   Pt will cal Gi ofc number is [phone], to be scheduled.   Pt has no history of testicular surgery. States he does have pain in the left groin area going up into the left hip.    [name], Pasty Spillers, MD  10/10/2019 3:14 PM EDT    Liver cyst Diverticulosis Plaque build up in aorta   Do you have history of left testicle surgery? They are questioning this He has abnormal tissue in the left groin ?hernia vs other  Is he having left groin pain?   Possibly gastritis in stomach -does he have appt with GI?

## 2019-10-12 NOTE — Telephone Encounter (Signed)
Referral placed.

## 2019-10-12 NOTE — Telephone Encounter (Signed)
error 

## 2019-10-12 NOTE — Addendum Note (Signed)
Addended by: Quentin Ore on: 10/12/2019 01:08 PM   Modules accepted: Orders

## 2019-10-15 DIAGNOSIS — F17210 Nicotine dependence, cigarettes, uncomplicated: Secondary | ICD-10-CM | POA: Diagnosis not present

## 2019-10-15 DIAGNOSIS — N433 Hydrocele, unspecified: Secondary | ICD-10-CM | POA: Diagnosis not present

## 2019-10-15 DIAGNOSIS — Z6825 Body mass index (BMI) 25.0-25.9, adult: Secondary | ICD-10-CM | POA: Diagnosis not present

## 2019-10-15 DIAGNOSIS — I10 Essential (primary) hypertension: Secondary | ICD-10-CM | POA: Diagnosis not present

## 2019-10-15 DIAGNOSIS — M549 Dorsalgia, unspecified: Secondary | ICD-10-CM | POA: Diagnosis not present

## 2019-10-15 DIAGNOSIS — Z79899 Other long term (current) drug therapy: Secondary | ICD-10-CM | POA: Diagnosis not present

## 2019-10-15 DIAGNOSIS — K922 Gastrointestinal hemorrhage, unspecified: Secondary | ICD-10-CM | POA: Diagnosis not present

## 2019-10-15 DIAGNOSIS — R1032 Left lower quadrant pain: Secondary | ICD-10-CM | POA: Diagnosis not present

## 2019-10-15 DIAGNOSIS — R638 Other symptoms and signs concerning food and fluid intake: Secondary | ICD-10-CM | POA: Diagnosis not present

## 2019-10-18 ENCOUNTER — Encounter: Payer: Self-pay | Admitting: Internal Medicine

## 2019-10-18 ENCOUNTER — Other Ambulatory Visit: Payer: Self-pay | Admitting: Internal Medicine

## 2019-10-18 ENCOUNTER — Other Ambulatory Visit: Payer: Self-pay

## 2019-10-18 ENCOUNTER — Other Ambulatory Visit (INDEPENDENT_AMBULATORY_CARE_PROVIDER_SITE_OTHER): Payer: PPO

## 2019-10-18 DIAGNOSIS — D649 Anemia, unspecified: Secondary | ICD-10-CM

## 2019-10-18 LAB — CBC WITH DIFFERENTIAL/PLATELET
Basophils Absolute: 0.1 10*3/uL (ref 0.0–0.1)
Basophils Relative: 1.2 % (ref 0.0–3.0)
Eosinophils Absolute: 0.1 10*3/uL (ref 0.0–0.7)
Eosinophils Relative: 2 % (ref 0.0–5.0)
HCT: 31.3 % — ABNORMAL LOW (ref 39.0–52.0)
Hemoglobin: 10.5 g/dL — ABNORMAL LOW (ref 13.0–17.0)
Lymphocytes Relative: 28.2 % (ref 12.0–46.0)
Lymphs Abs: 1.4 10*3/uL (ref 0.7–4.0)
MCHC: 33.4 g/dL (ref 30.0–36.0)
MCV: 94.5 fl (ref 78.0–100.0)
Monocytes Absolute: 0.5 10*3/uL (ref 0.1–1.0)
Monocytes Relative: 9.4 % (ref 3.0–12.0)
Neutro Abs: 2.9 10*3/uL (ref 1.4–7.7)
Neutrophils Relative %: 59.2 % (ref 43.0–77.0)
Platelets: 292 10*3/uL (ref 150.0–400.0)
RBC: 3.31 Mil/uL — ABNORMAL LOW (ref 4.22–5.81)
RDW: 14.2 % (ref 11.5–15.5)
WBC: 4.8 10*3/uL (ref 4.0–10.5)

## 2019-10-19 ENCOUNTER — Encounter: Payer: Self-pay | Admitting: Internal Medicine

## 2019-10-19 LAB — IRON,TIBC AND FERRITIN PANEL
%SAT: 20 % (calc) (ref 20–48)
Ferritin: 135 ng/mL (ref 24–380)
Iron: 77 ug/dL (ref 50–180)
TIBC: 379 mcg/dL (calc) (ref 250–425)

## 2019-10-22 ENCOUNTER — Other Ambulatory Visit: Payer: Self-pay

## 2019-10-22 ENCOUNTER — Ambulatory Visit: Payer: PPO | Admitting: Cardiovascular Disease

## 2019-10-22 ENCOUNTER — Encounter: Payer: Self-pay | Admitting: Cardiovascular Disease

## 2019-10-22 VITALS — BP 118/68 | HR 77 | Ht >= 80 in | Wt 235.0 lb

## 2019-10-22 DIAGNOSIS — R072 Precordial pain: Secondary | ICD-10-CM

## 2019-10-22 DIAGNOSIS — Z01812 Encounter for preprocedural laboratory examination: Secondary | ICD-10-CM

## 2019-10-22 DIAGNOSIS — I493 Ventricular premature depolarization: Secondary | ICD-10-CM

## 2019-10-22 DIAGNOSIS — D649 Anemia, unspecified: Secondary | ICD-10-CM | POA: Diagnosis not present

## 2019-10-22 DIAGNOSIS — R079 Chest pain, unspecified: Secondary | ICD-10-CM | POA: Diagnosis not present

## 2019-10-22 DIAGNOSIS — I10 Essential (primary) hypertension: Secondary | ICD-10-CM

## 2019-10-22 MED ORDER — METOPROLOL TARTRATE 100 MG PO TABS: ORAL_TABLET | ORAL | 0 refills | Status: DC

## 2019-10-22 NOTE — Progress Notes (Signed)
Cardiology Office Note   Date:  10/24/2019   ID:  John Keith, DOB January 30, 1951, MRN [id number]  PCP:  [name], Pasty Spillers, MD  Cardiologist:   Chilton Si, MD   No chief complaint on file.   History of Present Illness: John Keith is a 69 y.o. male with hypertension, hyperlipidemia, aortic atherosclerosis, tobacco abuse, gastric ulcer/GI bleed, and recent Covid 19 infection who is being seen today for the evaluation of hypertension and elevated troponin at the request of [name], French Ana *.  He has been seen in the ED at [facility] multiple times for blood pressures in the 160s over 90s to 100s.  In retrospect he realized that he purchased a wrist cuff and it is not accurate.  He was seen in the ED at [facility] for hypertension.  At home his blood pressure was reportedly 177/108.  In the ED it was 114/88.  He was reportedly discharged on amlodipine 10 mg and losartan 50 mg.  In the ED troponin was elevated to 0.042.  He had epigastric pain, however this was in the setting of a gastric ulcer.  He has a history of gastric ulcers and Dr. Judie Grieve put him on Protonix.  Since then he has been feeling much better.  He had melena that has since resolved.  She also started him on iron replacement for anemia.  He has not seen gastroenterology.  He had some bright red blood per rectum that he reports resolved with changing his toilet paper.  He also reported intermittent chest pain.  He saw his PCP on 3/26 that his blood pressure was 122/82.  John Keith had COVID-19 in January.  Since that time he feels differently.  He sometimes has chest discomfort when he is lying in bed and first trying to get up.  He denies any exertional chest pain.  He does, however, have exertional dyspnea.  He denies lower extremity edema, orthopnea, or PND.  He does not get much formal exercise but walks a lot around his property and at his son's restaurant.  He notes that he has a  difficult time catching his breath.  Prior to the COVID-19 infection he had no chest discomfort or shortness of breath.  His mother died suddenly in a bathtub in her 61s.  He was told that she had a heart attack.  He continues to smoke half a pack of cigarettes daily.  He is interested in quitting and thinks he can do it cold [place].  He notes that his appetite is good.  He struggles with fried food.  Past Medical History:  Diagnosis Date  . Allergy   . Arthritis   . Carpal tunnel syndrome on left   . Chest pain of uncertain etiology 10/24/2019  . CTS (carpal tunnel syndrome)    left  . DDD (degenerative disc disease), thoracolumbar    unknown location   . DDD (degenerative disc disease), thoracolumbar    multilevel  . Degenerative joint disease of left shoulder   . Degenerative joint disease of left shoulder    02/2011   . Diverticulosis    left colon (2008)   . ED (erectile dysfunction)    02/2011   . ED (erectile dysfunction)   . GERD (gastroesophageal reflux disease)   . H. pylori infection   . H. pylori infection   . Hyperlipidemia   . Hypertension   . Insomnia   . Lactose intolerance   . Lactose intolerance   .  Low back pain   . PVC (premature ventricular contraction) 10/24/2019  . Shingles    07/2018  . Smoking    smoking since age 40 y.o  . Toe fracture, left    4th in 2014  . Vitamin D deficiency   . Vitamin D deficiency     Past Surgical History:  Procedure Laterality Date  . APPENDECTOMY    . BACK SURGERY     x 2 10991 and 1995 in [place]   . CARDIAC CATHETERIZATION     1999 nl  . COLONOSCOPY     2008 diverticulosis  . ESOPHAGOGASTRODUODENOSCOPY     h/o +h. pylori  . OTHER SURGICAL HISTORY     heart catherization 1999 normal   . SPINE SURGERY     x 2      Current Outpatient Medications  Medication Sig Dispense Refill  . amLODipine (NORVASC) 5 MG tablet Take 1 tablet (5 mg total) by mouth daily. Goal BP less than 130 (top)/less than 80 (bottom) 90 tablet  3  . chlorthalidone (HYGROTON) 25 MG tablet Take 0.5 tablets (12.5 mg total) by mouth daily. In am If BP not <130/<80 contact office 45 tablet 2  . Cholecalciferol (VITAMIN D-3) 125 MCG (5000 UT) TABS Take by mouth.    . ezetimibe (ZETIA) 10 MG tablet Take 1 tablet (10 mg total) by mouth daily. 90 tablet 3  . losartan (COZAAR) 100 MG tablet Take 1 tablet (100 mg total) by mouth daily. In am 90 tablet 3  . pantoprazole (PROTONIX) 40 MG tablet Take 1 tablet (40 mg total) by mouth daily. 30 min before food 90 tablet 3  . metoprolol tartrate (LOPRESSOR) 100 MG tablet TAKE 1 TABLET 2 HOURS PRIOR TO CT 1 tablet 0   No current facility-administered medications for this visit.    Allergies:   Crestor [rosuvastatin calcium], Lipitor [atorvastatin calcium], and Tomato    Social History:  The patient  reports that he has been smoking. He started smoking about 48 years ago. He has been smoking about 0.50 packs per day. He has never used smokeless tobacco. He reports that he does not drink alcohol or use drugs.   Family History:  The patient's family history includes Cancer in his father and maternal uncle; Diabetes in his maternal aunt; Heart attack in his maternal grandmother; Heart disease in his mother; Throat cancer (age of onset: 50) in his father.    ROS:  Please see the history of present illness.   Otherwise, review of systems are positive for none.   All other systems are reviewed and negative.    PHYSICAL EXAM: VS:  BP 118/68   Pulse 77   Ht 6\' 9"  (2.057 m)   Wt 235 lb (106.6 kg)   SpO2 95%   BMI 25.18 kg/m  , BMI Body mass index is 25.18 kg/m. GENERAL:  Well appearing HEENT:  Pupils equal round and reactive, fundi not visualized, oral mucosa unremarkable NECK:  No jugular venous distention, waveform within normal limits, carotid upstroke brisk and symmetric, no bruits LUNGS:  Clear to auscultation bilaterally HEART:  RRR.  PMI not displaced or sustained,S1 and S2 within normal  limits, no S3, no S4, no clicks, no rubs, no murmurs ABD:  Flat, positive bowel sounds normal in frequency in pitch, no bruits, no rebound, no guarding, no midline pulsatile mass, no hepatomegaly, no splenomegaly EXT:  2 plus pulses throughout, no edema, no cyanosis no clubbing SKIN:  No rashes no nodules NEURO:  Cranial nerves II through XII grossly intact, motor grossly intact throughout PSYCH:  Cognitively intact, oriented to person place and time   EKG:  EKG is ordered today. The ekg ordered today demonstrates sinus rhythm.  Rate 77 bpm.  PACs and PVCs.   Recent Labs: 06/11/2019: ALT 31; BUN 13; Creatinine, Ser 1.06; Potassium 3.6; Sodium 140 10/18/2019: Hemoglobin 10.5 Repeated and verified X2.; Platelets 292.0    Lipid Panel    Component Value Date/Time   CHOL 164 06/11/2019 1047   TRIG 86.0 06/11/2019 1047   HDL 64.10 06/11/2019 1047   CHOLHDL 3 06/11/2019 1047   VLDL 17.2 06/11/2019 1047   LDLCALC 83 06/11/2019 1047      Wt Readings from Last 3 Encounters:  10/22/19 235 lb (106.6 kg)  09/28/19 234 lb 6.4 oz (106.3 kg)  08/09/19 213 lb (96.6 kg)      ASSESSMENT AND PLAN:  # Chest pain:  # Shortness of breath:   John Keith has exertional dyspnea and atypical chest pain.  The symptoms started after his COVID-19 infection in January.  I suspect that this is more related to Covid than ischemia.  However he does have risk factors including tobacco abuse and hypertension.  We will get a coronary CT-a.  This will also help better determine his LDL goal.  Given his history of gastric ulcers and anemia we will not start aspirin at this time.  Only plan for left heart catheterization if there are high risk features.  He would likely need GI consultation prior to undergoing cardiac catheterization unless the urgent findings.  # Essential hypertension: Blood pressure well-controlled today.  He reports taking his medications and there was an error with his home monitoring.   Continue chlorthalidone, amlodipine, and losartan.  #Tobacco abuse: Patient is ready to quit.  He plans to quit cold [place].  He was congratulated on this decision and offered assistance.  He does not want pharmacologic intervention.  5 minutes was spent discussing smoking cessation.  #Anemia: Presumed recurrent gastric ulcers.  Symptoms have resolved with pantoprazole.  He is taking iron therapy.  We will get a CBC when he has his labs for his coronary CT-A.  # PVCs:  Noted on EKG.  He is asymptomatic.  Check TSH.  Check CBC and BMP when he comes for preprocedure labs.  # Family history of SCD:  Mother died suddenly in a bathtub.  He was told that she had a heart attack.  It is not clear that this was well defined.  We are getting a cardiac CT-a as above.  This will show signs of HCM if present and evaluate for ischemia as above.  EKG and exam are not consistent with hypertrophic obstructive cardiomyopathy.  Current medicines are reviewed at length with the patient today.  The patient does not have concerns regarding medicines.  The following changes have been made:  no change  Labs/ tests ordered today include:   Orders Placed This Encounter  Procedures  . CT CORONARY MORPH W/CTA COR W/SCORE W/CA W/CM &/OR WO/CM  . CT CORONARY FRACTIONAL FLOW RESERVE DATA PREP  . CT CORONARY FRACTIONAL FLOW RESERVE FLUID ANALYSIS  . Basic metabolic panel  . CBC with Differential/Platelet  . TSH  . Magnesium  . EKG 12-Lead     Disposition:   FU with Blanka Rockholt C. Duke Salvia, MD, [facility] in 1 year.     Signed, Deshannon Hinchliffe C. Duke Salvia, MD, [facility]  10/24/2019 12:18 PM    [facility]  HeartCare

## 2019-10-22 NOTE — Patient Instructions (Signed)
Medication Instructions:  TAKE METOPROLOL 100 MG 2 HOURS PRIOR TO CT   *If you need a refill on your cardiac medications before your next appointment, please call your pharmacy*  Lab Work: BMET/CBC/TSH/MAGNESIUM 1 WEEK PRIOR TO CT  If you have labs (blood work) drawn today and your tests are completely normal, you will receive your results only by: Marland Kitchen MyChart Message (if you have MyChart) OR . A paper copy in the mail If you have any lab test that is abnormal or we need to change your treatment, we will call you to review the results.  Testing/Procedures: Your physician has requested that you have cardiac CT. Cardiac computed tomography (CT) is a painless test that uses an x-ray machine to take clear, detailed pictures of your heart. For further information please visit [email or web address] Please follow instruction sheet as given.  Follow-Up: At [facility], you and your health needs are our priority.  As part of our continuing mission to provide you with exceptional heart care, we have created designated Provider Care Teams.  These Care Teams include your primary Cardiologist (physician) and Advanced Practice Providers (APPs -  Physician Assistants and Nurse Practitioners) who all work together to provide you with the care you need, when you need it.  We recommend signing up for the patient portal called "MyChart".  Sign up information is provided on this After Visit Summary.  MyChart is used to connect with patients for Virtual Visits (Telemedicine).  Patients are able to view lab/test results, encounter notes, upcoming appointments, etc.  Non-urgent messages can be sent to your provider as well.   To learn more about what you can do with MyChart, go to [email or web address].    Your next appointment:   12 month(s)  You will receive a reminder letter in the mail two months in advance. If you don't receive a letter, please call our office to schedule the follow-up appointment.  The  format for your next appointment:   In Person  Provider:   You may see [facility]  or one of the following Advanced Practice Providers on your designated Care Team:    Corine Shelter, PA-C  [place], [place]  Edd Fabian, [place]  Other Instructions  Your cardiac CT will be scheduled at one of the below locations:   [facility] [address] [place], [place] [address] [phone]  OR  [facility] [address] [place], [place] [address] [phone]  If scheduled [facility], please arrive at the [facility] main entrance of [facility] 30 minutes prior to test start time. Proceed to the [facility] Radiology Department (first floor) to check-in and test prep.  If scheduled at [facility], please arrive 15 mins early for check-in and test prep.  Please follow these instructions carefully (unless otherwise directed):  Hold all erectile dysfunction medications at least 3 days (72 hrs) prior to test.  On the Night Before the Test: . Be sure to Drink plenty of water. . Do not consume any caffeinated/decaffeinated beverages or chocolate 12 hours prior to your test. . Do not take any antihistamines 12 hours prior to your test. . If you take Metformin do not take 24 hours prior to test. . If the patient has contrast allergy: ? Patient will need a prescription for Prednisone and very clear instructions (as follows): 1. Prednisone 50 mg - take 13 hours prior to test 2. Take another Prednisone 50 mg  7 hours prior to test 3. Take another Prednisone 50 mg 1 hour prior to test 4. Take Benadryl 50 mg 1 hour prior to test . Patient must complete all four doses of above prophylactic medications. . Patient will need a ride after test due to Benadryl.  On the Day of the Test: . Drink plenty of water. Do not drink any water within one hour of the test. . Do not eat any food 4  hours prior to the test. . You may take your regular medications prior to the test.  . Take metoprolol (Lopressor) two hours prior to test. . HOLD Furosemide/Hydrochlorothiazide morning of the test. . FEMALES- please wear underwire-free bra if available      After the Test: . Drink plenty of water. . After receiving IV contrast, you may experience a mild flushed feeling. This is normal. . On occasion, you may experience a mild rash up to 24 hours after the test. This is not dangerous. If this occurs, you can take Benadryl 25 mg and increase your fluid intake. . If you experience trouble breathing, this can be serious. If it is severe call 911 IMMEDIATELY. If it is mild, please call our office. . If you take any of these medications: Glipizide/Metformin, Avandament, Glucavance, please do not take 48 hours after completing test unless otherwise instructed.   Once we have confirmed authorization from your insurance company, we will call you to set up a date and time for your test.   For non-scheduling related questions, please contact the cardiac imaging nurse navigator should you have any questions/concerns: Marchia Bond, RN Navigator Cardiac Imaging [facility] [phone] office  For scheduling needs, including cancellations and rescheduling, please call [phone].    Cardiac CT Angiogram A cardiac CT angiogram is a procedure to look at the heart and the area around the heart. It may be done to help find the cause of chest pains or other symptoms of heart disease. During this procedure, a substance called contrast dye is injected into the blood vessels in the area to be checked. A large X-ray machine, called a CT scanner, then takes detailed pictures of the heart and the surrounding area. The procedure is also sometimes called a coronary CT angiogram, coronary artery scanning, or CTA. A cardiac CT angiogram allows the health care provider to see how well blood  is flowing to and from the heart. The health care provider will be able to see if there are any problems, such as:  Blockage or narrowing of the coronary arteries in the heart.  Fluid around the heart.  Signs of weakness or disease in the muscles, valves, and tissues of the heart. Tell a health care provider about:  Any allergies you have. This is especially important if you have had a previous allergic reaction to contrast dye.  All medicines you are taking, including vitamins, herbs, eye drops, creams, and over-the-counter medicines.  Any blood disorders you have.  Any surgeries you have had.  Any medical conditions you have.  Whether you are pregnant or may be pregnant.  Any anxiety disorders, chronic pain, or other conditions you have that may increase your stress or prevent you from lying still. What are the risks? Generally, this is a safe procedure. However, problems may occur, including:  Bleeding.  Infection.  Allergic reactions to medicines or dyes.  Damage to other structures or organs.  Kidney damage from the contrast dye that is used.  Increased risk of  cancer from radiation exposure. This risk is low. Talk with your health care provider about: ? The risks and benefits of testing. ? How you can receive the lowest dose of radiation. What happens before the procedure?  Wear comfortable clothing and remove any jewelry, glasses, dentures, and hearing aids.  Follow instructions from your health care provider about eating and drinking. This may include: ? For 12 hours before the procedure -- avoid caffeine. This includes tea, coffee, soda, energy drinks, and diet pills. Drink plenty of water or other fluids that do not have caffeine in them. Being well hydrated can prevent complications. ? For 4-6 hours before the procedure -- stop eating and drinking. The contrast dye can cause nausea, but this is less likely if your stomach is empty.  Ask your health care  provider about changing or stopping your regular medicines. This is especially important if you are taking diabetes medicines, blood thinners, or medicines to treat problems with erections (erectile dysfunction). What happens during the procedure?   Hair on your chest may need to be removed so that small sticky patches called electrodes can be placed on your chest. These will transmit information that helps to monitor your heart during the procedure.  An IV will be inserted into one of your veins.  You might be given a medicine to control your heart rate during the procedure. This will help to ensure that good images are obtained.  You will be asked to lie on an exam table. This table will slide in and out of the CT machine during the procedure.  Contrast dye will be injected into the IV. You might feel warm, or you may get a metallic taste in your mouth.  You will be given a medicine called nitroglycerin. This will relax or dilate the arteries in your heart.  The table that you are lying on will move into the CT machine tunnel for the scan.  The person running the machine will give you instructions while the scans are being done. You may be asked to: ? Keep your arms above your head. ? Hold your breath. ? Stay very still, even if the table is moving.  When the scanning is complete, you will be moved out of the machine.  The IV will be removed. The procedure may vary among health care providers and hospitals. What can I expect after the procedure? After your procedure, it is common to have:  A metallic taste in your mouth from the contrast dye.  A feeling of warmth.  A headache from the nitroglycerin. Follow these instructions at home:  Take over-the-counter and prescription medicines only as told by your health care provider.  If you are told, drink enough fluid to keep your urine pale yellow. This will help to flush the contrast dye out of your body.  Most people can return  to their normal activities right after the procedure. Ask your health care provider what activities are safe for you.  It is up to you to get the results of your procedure. Ask your health care provider, or the department that is doing the procedure, when your results will be ready.  Keep all follow-up visits as told by your health care provider. This is important. Contact a health care provider if:  You have any symptoms of allergy to the contrast dye. These include: ? Shortness of breath. ? Rash or hives. ? A racing heartbeat. Summary  A cardiac CT angiogram is a procedure to look at  the heart and the area around the heart. It may be done to help find the cause of chest pains or other symptoms of heart disease.  During this procedure, a large X-ray machine, called a CT scanner, takes detailed pictures of the heart and the surrounding area after a contrast dye has been injected into blood vessels in the area.  Ask your health care provider about changing or stopping your regular medicines before the procedure. This is especially important if you are taking diabetes medicines, blood thinners, or medicines to treat erectile dysfunction.  If you are told, drink enough fluid to keep your urine pale yellow. This will help to flush the contrast dye out of your body. This information is not intended to replace advice given to you by your health care provider. Make sure you discuss any questions you have with your health care provider. Document Revised: 02/14/2019 Document Reviewed: 02/14/2019 Elsevier Patient Education  2020 [employer].

## 2019-10-24 ENCOUNTER — Encounter: Payer: Self-pay | Admitting: Cardiovascular Disease

## 2019-10-24 ENCOUNTER — Telehealth (HOSPITAL_COMMUNITY): Payer: Self-pay | Admitting: Emergency Medicine

## 2019-10-24 DIAGNOSIS — I493 Ventricular premature depolarization: Secondary | ICD-10-CM

## 2019-10-24 DIAGNOSIS — R079 Chest pain, unspecified: Secondary | ICD-10-CM | POA: Insufficient documentation

## 2019-10-24 HISTORY — DX: Ventricular premature depolarization: I49.3

## 2019-10-24 HISTORY — DX: Chest pain, unspecified: R07.9

## 2019-10-24 NOTE — Telephone Encounter (Signed)
VM box full and cannot leave new messages  Rockwell Alexandria RN Navigator Cardiac Imaging [facility] [phone] Office  [phone] Cell

## 2019-10-25 ENCOUNTER — Ambulatory Visit
Admission: RE | Admit: 2019-10-25 | Discharge: 2019-10-25 | Disposition: A | Discharge status: Home / Self Care | Payer: PPO | Source: Ambulatory Visit | Attending: Cardiovascular Disease | Admitting: Cardiovascular Disease

## 2019-10-25 ENCOUNTER — Other Ambulatory Visit: Payer: Self-pay

## 2019-10-25 DIAGNOSIS — I10 Essential (primary) hypertension: Secondary | ICD-10-CM | POA: Diagnosis not present

## 2019-10-25 DIAGNOSIS — R072 Precordial pain: Secondary | ICD-10-CM | POA: Diagnosis not present

## 2019-10-25 MED ORDER — IOHEXOL 350 MG/ML SOLN
125.0000 mL | Freq: Once | INTRAVENOUS | Status: AC | PRN
  Administered 2019-10-25: 125 mL via INTRAVENOUS

## 2019-10-25 MED ORDER — NITROGLYCERIN 0.4 MG SL SUBL
0.8000 mg | SUBLINGUAL_TABLET | Freq: Once | SUBLINGUAL | Status: AC
  Administered 2019-10-25: 0.8 mg via SUBLINGUAL

## 2019-10-25 MED ORDER — METOPROLOL TARTRATE 5 MG/5ML IV SOLN
10.0000 mg | Freq: Once | INTRAVENOUS | Status: AC
  Administered 2019-10-25: 10 mg via INTRAVENOUS

## 2019-10-25 NOTE — Progress Notes (Signed)
Patient tolerated CT well. Gave a bottle of water to drink after. Ambulated to exit steady gait.  

## 2019-11-05 ENCOUNTER — Telehealth: Payer: Self-pay | Admitting: Internal Medicine

## 2019-11-05 DIAGNOSIS — K219 Gastro-esophageal reflux disease without esophagitis: Secondary | ICD-10-CM | POA: Diagnosis not present

## 2019-11-05 DIAGNOSIS — D649 Anemia, unspecified: Secondary | ICD-10-CM | POA: Diagnosis not present

## 2019-11-05 NOTE — Telephone Encounter (Signed)
Please mail letter with the #s for him to call urology and cardiology   Arianna if you can call pt again and try his wife as well   Thanks TMS

## 2019-11-05 NOTE — Telephone Encounter (Signed)
Pt was reached out by [facility] on 10/15/2019 no vm set up a appt card was mailed out to pt to call ofc to sch.  I called pt on both numbers not able to leave vm.

## 2019-11-07 NOTE — Telephone Encounter (Signed)
Tried calling and unable to reach patient. Letter mailed to address on file.

## 2019-11-08 DIAGNOSIS — Z1159 Encounter for screening for other viral diseases: Secondary | ICD-10-CM | POA: Diagnosis not present

## 2019-11-12 DIAGNOSIS — K921 Melena: Secondary | ICD-10-CM | POA: Diagnosis not present

## 2019-11-12 DIAGNOSIS — D510 Vitamin B12 deficiency anemia due to intrinsic factor deficiency: Secondary | ICD-10-CM | POA: Diagnosis not present

## 2019-11-12 DIAGNOSIS — D649 Anemia, unspecified: Secondary | ICD-10-CM | POA: Diagnosis not present

## 2019-11-15 DIAGNOSIS — K449 Diaphragmatic hernia without obstruction or gangrene: Secondary | ICD-10-CM | POA: Diagnosis not present

## 2019-11-15 DIAGNOSIS — K319 Disease of stomach and duodenum, unspecified: Secondary | ICD-10-CM | POA: Diagnosis not present

## 2019-11-15 DIAGNOSIS — K921 Melena: Secondary | ICD-10-CM | POA: Diagnosis not present

## 2019-11-15 DIAGNOSIS — R935 Abnormal findings on diagnostic imaging of other abdominal regions, including retroperitoneum: Secondary | ICD-10-CM | POA: Diagnosis not present

## 2019-11-15 DIAGNOSIS — Z8711 Personal history of peptic ulcer disease: Secondary | ICD-10-CM | POA: Diagnosis not present

## 2019-11-15 DIAGNOSIS — K2101 Gastro-esophageal reflux disease with esophagitis, with bleeding: Secondary | ICD-10-CM | POA: Diagnosis not present

## 2019-11-15 DIAGNOSIS — D649 Anemia, unspecified: Secondary | ICD-10-CM | POA: Diagnosis not present

## 2019-11-15 DIAGNOSIS — F17210 Nicotine dependence, cigarettes, uncomplicated: Secondary | ICD-10-CM | POA: Diagnosis not present

## 2019-11-15 DIAGNOSIS — I10 Essential (primary) hypertension: Secondary | ICD-10-CM | POA: Diagnosis not present

## 2019-12-13 ENCOUNTER — Other Ambulatory Visit: Payer: Self-pay

## 2019-12-13 ENCOUNTER — Ambulatory Visit (INDEPENDENT_AMBULATORY_CARE_PROVIDER_SITE_OTHER): Payer: PPO | Admitting: Internal Medicine

## 2019-12-13 ENCOUNTER — Encounter: Payer: Self-pay | Admitting: Internal Medicine

## 2019-12-13 VITALS — BP 136/78 | HR 78 | Temp 97.5°F | Ht >= 80 in | Wt 234.2 lb

## 2019-12-13 DIAGNOSIS — R202 Paresthesia of skin: Secondary | ICD-10-CM | POA: Diagnosis not present

## 2019-12-13 DIAGNOSIS — Z1329 Encounter for screening for other suspected endocrine disorder: Secondary | ICD-10-CM | POA: Diagnosis not present

## 2019-12-13 DIAGNOSIS — Z125 Encounter for screening for malignant neoplasm of prostate: Secondary | ICD-10-CM

## 2019-12-13 DIAGNOSIS — I10 Essential (primary) hypertension: Secondary | ICD-10-CM

## 2019-12-13 DIAGNOSIS — D649 Anemia, unspecified: Secondary | ICD-10-CM | POA: Diagnosis not present

## 2019-12-13 DIAGNOSIS — E785 Hyperlipidemia, unspecified: Secondary | ICD-10-CM | POA: Diagnosis not present

## 2019-12-13 DIAGNOSIS — K2950 Unspecified chronic gastritis without bleeding: Secondary | ICD-10-CM

## 2019-12-13 DIAGNOSIS — L309 Dermatitis, unspecified: Secondary | ICD-10-CM | POA: Diagnosis not present

## 2019-12-13 LAB — CBC WITH DIFFERENTIAL/PLATELET
Basophils Absolute: 0.1 10*3/uL (ref 0.0–0.1)
Basophils Relative: 1.2 % (ref 0.0–3.0)
Eosinophils Absolute: 0.2 10*3/uL (ref 0.0–0.7)
Eosinophils Relative: 3.9 % (ref 0.0–5.0)
HCT: 40.2 % (ref 39.0–52.0)
Hemoglobin: 13.2 g/dL (ref 13.0–17.0)
Lymphocytes Relative: 29 % (ref 12.0–46.0)
Lymphs Abs: 1.4 10*3/uL (ref 0.7–4.0)
MCHC: 32.7 g/dL (ref 30.0–36.0)
MCV: 95.3 fl (ref 78.0–100.0)
Monocytes Absolute: 0.4 10*3/uL (ref 0.1–1.0)
Monocytes Relative: 8.9 % (ref 3.0–12.0)
Neutro Abs: 2.8 10*3/uL (ref 1.4–7.7)
Neutrophils Relative %: 57 % (ref 43.0–77.0)
Platelets: 298 10*3/uL (ref 150.0–400.0)
RBC: 4.22 Mil/uL (ref 4.22–5.81)
RDW: 13.9 % (ref 11.5–15.5)
WBC: 4.9 10*3/uL (ref 4.0–10.5)

## 2019-12-13 LAB — LIPID PANEL
Cholesterol: 167 mg/dL (ref 0–200)
HDL: 49.6 mg/dL (ref >39.00)
LDL Cholesterol: 99 mg/dL (ref 0–99)
NonHDL: 117.29
Total CHOL/HDL Ratio: 3
Triglycerides: 91 mg/dL (ref 0.0–149.0)
VLDL: 18.2 mg/dL (ref 0.0–40.0)

## 2019-12-13 LAB — TSH: TSH: 1.05 u[IU]/mL (ref 0.35–4.50)

## 2019-12-13 LAB — PSA: PSA: 1.28 ng/mL (ref 0.10–4.00)

## 2019-12-13 MED ORDER — TRIAMCINOLONE ACETONIDE 0.1 % EX CREA: 1.0000 "application " | TOPICAL_CREAM | Freq: Two times a day (BID) | CUTANEOUS | 11 refills | Status: DC | PRN

## 2019-12-13 NOTE — Patient Instructions (Addendum)
John Keith better to cook with  Debrox ear wax drops over the counter  Triamcinolone cream to back  Cream cetaphil/cerave    DASH Eating Plan DASH stands for "Dietary Approaches to Stop Hypertension." The DASH eating plan is a healthy eating plan that has been shown to reduce high blood pressure (hypertension). It may also reduce your risk for type 2 diabetes, heart disease, and stroke. The DASH eating plan may also help with weight loss. What are tips for following this plan?  General guidelines  Avoid eating more than 2,300 mg (milligrams) of salt (sodium) a day. If you have hypertension, you may need to reduce your sodium intake to 1,500 mg a day.  Limit alcohol intake to no more than 1 drink a day for nonpregnant women and 2 drinks a day for men. One drink equals 12 oz of beer, 5 oz of wine, or 1 oz of hard liquor.  Work with your health care provider to maintain a healthy body weight or to lose weight. Ask what an ideal weight is for you.  Get at least 30 minutes of exercise that causes your heart to beat faster (aerobic exercise) most days of the week. Activities may include walking, swimming, or biking.  Work with your health care provider or diet and nutrition specialist (dietitian) to adjust your eating plan to your individual calorie needs. Reading food labels   Check food labels for the amount of sodium per serving. Choose foods with less than 5 percent of the Daily Value of sodium. Generally, foods with less than 300 mg of sodium per serving fit into this eating plan.  To find whole grains, look for the word "whole" as the first word in the ingredient list. Shopping  Buy products labeled as "low-sodium" or "no salt added."  Buy fresh foods. Avoid canned foods and premade or frozen meals. Cooking  Avoid adding salt when cooking. Use salt-free seasonings or herbs instead of table salt or sea salt. Check with your health care provider or pharmacist before using  salt substitutes.  Do not fry foods. Cook foods using healthy methods such as baking, boiling, grilling, and broiling instead.  Cook with heart-healthy oils, such as olive, canola, soybean, or sunflower oil. Meal planning  Eat a balanced diet that includes: ? 5 or more servings of fruits and vegetables each day. At each meal, try to fill half of your plate with fruits and vegetables. ? Up to 6-8 servings of whole grains each day. ? Less than 6 oz of lean meat, poultry, or fish each day. A 3-oz serving of meat is about the same size as a deck of cards. One egg equals 1 oz. ? 2 servings of low-fat dairy each day. ? A serving of nuts, seeds, or beans 5 times each week. ? Heart-healthy fats. Healthy fats called Omega-3 fatty acids are found in foods such as flaxseeds and coldwater fish, like sardines, salmon, and mackerel.  Limit how much you eat of the following: ? Canned or prepackaged foods. ? Food that is high in trans fat, such as fried foods. ? Food that is high in saturated fat, such as fatty meat. ? Sweets, desserts, sugary drinks, and other foods with added sugar. ? Full-fat dairy products.  Do not salt foods before eating.  Try to eat at least 2 vegetarian meals each week.  Eat more home-cooked food and less restaurant, buffet, and fast food.  When eating at a restaurant, ask that your food be prepared  with less salt or no salt, if possible. What foods are recommended? The items listed may not be a complete list. Talk with your dietitian about what dietary choices are best for you. Grains Whole-grain or whole-wheat bread. Whole-grain or whole-wheat pasta. Brown rice. John Keith. Bulgur. Whole-grain and low-sodium cereals. Pita bread. Low-fat, low-sodium crackers. Whole-wheat flour tortillas. Vegetables Fresh or frozen vegetables (raw, steamed, roasted, or grilled). Low-sodium or reduced-sodium tomato and vegetable juice. Low-sodium or reduced-sodium tomato sauce and  tomato paste. Low-sodium or reduced-sodium canned vegetables. Fruits All fresh, dried, or frozen fruit. Canned fruit in natural juice (without added sugar). Meat and other protein foods Skinless chicken or [place]. Ground chicken or [place]. Pork with fat trimmed off. Fish and seafood. Egg whites. Dried beans, peas, or lentils. Unsalted nuts, nut butters, and seeds. Unsalted canned beans. Lean cuts of beef with fat trimmed off. Low-sodium, lean deli meat. Dairy Low-fat (1%) or fat-free (skim) milk. Fat-free, low-fat, or reduced-fat cheeses. Nonfat, low-sodium ricotta or cottage cheese. Low-fat or nonfat yogurt. Low-fat, low-sodium cheese. Fats and oils Soft margarine without trans fats. Vegetable oil. Low-fat, reduced-fat, or light mayonnaise and salad dressings (reduced-sodium). Canola, safflower, olive, soybean, and sunflower oils. Avocado. Seasoning and other foods Herbs. Spices. Seasoning mixes without salt. Unsalted popcorn and pretzels. Fat-free sweets. What foods are not recommended? The items listed may not be a complete list. Talk with your dietitian about what dietary choices are best for you. Grains Baked goods made with fat, such as croissants, muffins, or some breads. Dry pasta or rice meal packs. Vegetables Creamed or fried vegetables. Vegetables in a cheese sauce. Regular canned vegetables (not low-sodium or reduced-sodium). Regular canned tomato sauce and paste (not low-sodium or reduced-sodium). Regular tomato and vegetable juice (not low-sodium or reduced-sodium). John Keith. Olives. Fruits Canned fruit in a light or heavy syrup. Fried fruit. Fruit in cream or butter sauce. Meat and other protein foods Fatty cuts of meat. Ribs. Fried meat. John Keith. Sausage. Bologna and other processed lunch meats. Salami. Fatback. Hotdogs. Bratwurst. Salted nuts and seeds. Canned beans with added salt. Canned or smoked fish. Whole eggs or egg yolks. Chicken or [place] with skin. Dairy Whole or 2%  milk, cream, and half-and-half. Whole or full-fat cream cheese. Whole-fat or sweetened yogurt. Full-fat cheese. Nondairy creamers. Whipped toppings. Processed cheese and cheese spreads. Fats and oils Butter. Stick margarine. Lard. Shortening. Ghee. Bacon fat. Tropical oils, such as coconut, palm kernel, or palm oil. Seasoning and other foods Salted popcorn and pretzels. Onion salt, garlic salt, seasoned salt, table salt, and sea salt. Worcestershire sauce. Tartar sauce. Barbecue sauce. Teriyaki sauce. Soy sauce, including reduced-sodium. Steak sauce. Canned and packaged gravies. Fish sauce. Oyster sauce. Cocktail sauce. Horseradish that you find on the shelf. Ketchup. Mustard. Meat flavorings and tenderizers. Bouillon cubes. Hot sauce and Tabasco sauce. Premade or packaged marinades. Premade or packaged taco seasonings. Relishes. Regular salad dressings. Where to find more information:  National Heart, Lung, and [place]: https://wilson-eaton.com/  American Heart Association: www.heart.org Summary  The DASH eating plan is a healthy eating plan that has been shown to reduce high blood pressure (hypertension). It may also reduce your risk for type 2 diabetes, heart disease, and stroke.  With the DASH eating plan, you should limit salt (sodium) intake to 2,300 mg a day. If you have hypertension, you may need to reduce your sodium intake to 1,500 mg a day.  When on the DASH eating plan, aim to eat more fresh fruits and vegetables, whole grains, lean  proteins, low-fat dairy, and heart-healthy fats.  Work with your health care provider or diet and nutrition specialist (dietitian) to adjust your eating plan to your individual calorie needs. This information is not intended to replace advice given to you by your health care provider. Make sure you discuss any questions you have with your health care provider. Document Revised: 06/03/2017 Document Reviewed: 06/14/2016 Elsevier Patient Education  2020  [employer].

## 2019-12-13 NOTE — Progress Notes (Addendum)
Chief Complaint  Patient presents with  . Follow-up   F/u  1.HTN improved on norvasc 5, chlorthalidone 12.5, losartan 100  2. Gastritis on protonix 40 mg qd no further blood in stool need to get records EGD GI in [place] Dr. Onnie Boer f/u sch 02/2020 3. Anemia on iron daily will repeat CBC 4. Cp resolved CT cardiac negative f/u Dr. Oval Linsey 1 year    Review of Systems  Constitutional: Negative for weight loss.  HENT: Negative for hearing loss.   Eyes: Negative for blurred vision.  Respiratory: Negative for shortness of breath.   Cardiovascular: Negative for chest pain.  Gastrointestinal: Negative for abdominal pain.  Musculoskeletal: Negative for falls.  Skin: Negative for rash.  Neurological: Negative for headaches.  Psychiatric/Behavioral: Negative for memory loss.   Past Medical History:  Diagnosis Date  . Allergy   . Arthritis   . Carpal tunnel syndrome on left   . Chest pain of uncertain etiology 3/33/5456  . CTS (carpal tunnel syndrome)    left  . DDD (degenerative disc disease), thoracolumbar    unknown location   . DDD (degenerative disc disease), thoracolumbar    multilevel  . Degenerative joint disease of left shoulder   . Degenerative joint disease of left shoulder    02/2011   . Diverticulosis    left colon (2008)   . ED (erectile dysfunction)    02/2011   . ED (erectile dysfunction)   . GERD (gastroesophageal reflux disease)   . H. pylori infection   . H. pylori infection   . Hyperlipidemia   . Hypertension   . Insomnia   . Lactose intolerance   . Lactose intolerance   . Low back pain   . PVC (premature ventricular contraction) 10/24/2019  . Shingles    07/2018  . Smoking    smoking since age 69 y.o  . Toe fracture, left    4th in 2014  . Vitamin D deficiency   . Vitamin D deficiency    Past Surgical History:  Procedure Laterality Date  . APPENDECTOMY    . BACK SURGERY     x 2 10991 and 1995 in [place]   . CARDIAC CATHETERIZATION     1999 nl   . COLONOSCOPY     2008 diverticulosis  . ESOPHAGOGASTRODUODENOSCOPY     h/o +h. pylori  . OTHER SURGICAL HISTORY     heart catherization 1999 normal   . SPINE SURGERY     x 2    Family History  Problem Relation Age of Onset  . Heart disease Mother        age 67   . Throat cancer Father 39  . Cancer Father        throat   . Cancer Maternal Uncle        uncle ?maternal or paternal died colon cancer in his 49s   . Diabetes Maternal Aunt   . Heart attack Maternal Grandmother    Social History   Socioeconomic History  . Marital status: Married    Spouse name: Not on file  . Number of children: Not on file  . Years of education: Not on file  . Highest education level: Not on file  Occupational History  . Not on file  Tobacco Use  . Smoking status: Current Every Day Smoker    Packs/day: 0.50    Start date: 07/06/1971  . Smokeless tobacco: Never Used  . Tobacco comment: cutting back  Substance and Sexual Activity  .  Alcohol use: No    Alcohol/week: 0.0 standard drinks  . Drug use: No  . Sexual activity: Not on file  Other Topics Concern  . 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 Difficulty of Paying Living Expenses: Not hard at all  Food Insecurity: No Food Insecurity  . Worried About Charity fundraiser in the Last Year: Never true  . Ran Out of Food in the Last Year: Never true  Transportation Needs: No Transportation Needs  . Lack of Transportation (Medical): No  . Lack of Transportation (Non-Medical): No  Physical Activity: Sufficiently Active  . Days of Exercise per Week: 5 days  . Minutes of Exercise per Session: 30 min  Stress: No Stress Concern Present  . Feeling of Stress : Not at all  Social Connections: Unknown  . Frequency of Communication with  Friends and Family: More than three times a week  . Frequency of Social Gatherings with Friends and Family: More than three times a week  . Attends Religious Services: Not on file  . Active Member of Clubs or Organizations: Yes  . Attends Archivist Meetings: Not on file  . Marital Status: Married  Human resources officer Violence: Not At Risk  . Fear of Current or Ex-Partner: No  . Emotionally Abused: No  . Physically Abused: No  . Sexually Abused: No   Current Meds  Medication Sig  . amLODipine (NORVASC) 5 MG tablet Take 1 tablet (5 mg total) by mouth daily. Goal BP less than 130 (top)/less than 80 (bottom)  . chlorthalidone (HYGROTON) 25 MG tablet Take 0.5 tablets (12.5 mg total) by mouth daily. In am If BP not <130/<80 contact office  . Cholecalciferol (VITAMIN D-3) 125 MCG (5000 UT) TABS Take by mouth.  . ezetimibe (ZETIA) 10 MG tablet Take 1 tablet (10 mg total) by mouth daily.  . ferrous sulfate 325 (65 FE) MG tablet Take 325 mg by mouth daily with breakfast.  . losartan (COZAAR) 100 MG tablet Take 1 tablet (100 mg total) by mouth daily. In am  . metoprolol tartrate (LOPRESSOR) 100 MG tablet TAKE 1 TABLET 2 HOURS PRIOR TO CT  . Multiple Vitamin (MULTIVITAMIN) tablet Take 1 tablet by mouth daily.  . pantoprazole (PROTONIX) 40 MG tablet Take 1 tablet (40 mg total) by mouth daily. 30 min before food  . VITAMIN D, CHOLECALCIFEROL, PO Take by mouth.   Allergies  Allergen Reactions  . Crestor [Rosuvastatin Calcium]     Elevated muscle enzymes   . Lipitor [Atorvastatin Calcium]     Elevated muscle enzymes   . Tomato Itching   Recent Results (from the past 2160 hour(s))  CBC with Differential/Platelet     Status: Abnormal   Collection Time: 10/18/19  9:54 AM  Result Value Ref Range   WBC 4.8 4.0 - 10.5 K/uL   RBC 3.31 (L) 4.22 - 5.81 Mil/uL   Hemoglobin 10.5 Repeated and verified X2. (L) 13.0 - 17.0 g/dL   HCT 31.3 (L) 39 - 52 %   MCV 94.5 78.0 - 100.0 fl   MCHC 33.4  30.0 - 36.0 g/dL   RDW 14.2 11.5 - 15.5 %   Platelets 292.0 150 - 400  K/uL   Neutrophils Relative % 59.2 43 - 77 %   Lymphocytes Relative 28.2 12 - 46 %   Monocytes Relative 9.4 3 - 12 %   Eosinophils Relative 2.0 0 - 5 %   Basophils Relative 1.2 0 - 3 %   Neutro Abs 2.9 1.4 - 7.7 K/uL   Lymphs Abs 1.4 0.7 - 4.0 K/uL   Monocytes Absolute 0.5 0 - 1 K/uL   Eosinophils Absolute 0.1 0 - 0 K/uL   Basophils Absolute 0.1 0 - 0 K/uL  Iron, TIBC and Ferritin Panel     Status: None   Collection Time: 10/18/19  9:54 AM  Result Value Ref Range   Iron 77 50 - 180 mcg/dL   TIBC 379 250 - 425 mcg/dL (calc)   %SAT 20 20 - 48 % (calc)   Ferritin 135 24 - 380 ng/mL   Objective  Body mass index is 25.1 kg/m. Wt Readings from Last 3 Encounters:  12/13/19 234 lb 3.2 oz (106.2 kg)  10/22/19 235 lb (106.6 kg)  09/28/19 234 lb 6.4 oz (106.3 kg)   Temp Readings from Last 3 Encounters:  12/13/19 (!) 97.5 F (36.4 C) (Temporal)  09/28/19 97.7 F (36.5 C) (Temporal)  02/19/19 (!) 97.4 F (36.3 C)   BP Readings from Last 3 Encounters:  12/13/19 136/78  10/25/19 118/75  10/22/19 118/68   Pulse Readings from Last 3 Encounters:  12/13/19 78  10/25/19 (!) 58  10/22/19 77    Physical Exam Vitals and nursing note reviewed.  Constitutional:      Appearance: Normal appearance. He is well-developed and well-groomed.  HENT:     Head: Normocephalic and atraumatic.  Eyes:     Conjunctiva/sclera: Conjunctivae normal.     Pupils: Pupils are equal, round, and reactive to light.  Cardiovascular:     Rate and Rhythm: Normal rate and regular rhythm.     Heart sounds: Normal heart sounds. No murmur heard.   Pulmonary:     Effort: Pulmonary effort is normal.     Breath sounds: Normal breath sounds.  Skin:    General: Skin is warm and dry.  Neurological:     General: No focal deficit present.     Mental Status: He is alert and oriented to person, place, and time. Mental status is at baseline.      Gait: Gait normal.  Psychiatric:        Attention and Perception: Attention and perception normal.        Mood and Affect: Mood and affect normal.        Speech: Speech normal.        Behavior: Behavior normal. Behavior is cooperative.        Thought Content: Thought content normal.        Cognition and Memory: Cognition and memory normal.        Judgment: Judgment normal.     Assessment  Plan  Essential hypertension controlled cont meds   Hyperlipidemia, unspecified hyperlipidemia type - Plan: Lipid panel Cont zetia 10   Anemia, unspecified type - Plan: CBC with Differential/Platelet Repeat  Get records EGD Dr. Moshe Salisbury [place]  ROI signed today   Eczema vs notalgia paresthetica uper back - Plan: triamcinolone cream (KENALOG) 0.1 %  Chronic gastritis, presence of bleeding unspecified, unspecified gastritis type  See above  Cont protnoix   Hm Flu shotutd prevnar and pna 23 utd11/30/17 and 02/16/15 Tdap had 06/15/13  shingrixsent to pharmacy h/o shingles covid  vx 2/2 pfizer   Hep B immune  PSA3/30/2020 normal -need to check in futur e Hep C neg 06/03/16 D/c MMR check 2/2 cost a1c 5.3 06/11/19  Colonoscopy 11/29/06 [facility] [place] -had 12/17/16  -ROI sent EGD 2021  EGD 11/15/19 prob healing ulver in duodenum, mild deformity antrum likely from old ulcer disease bx negative, hiatal hernia Dr. Lyda Jester   Tobacco abuse long term-rec smoking cessation1/2 ppd since age 61 y.o max 1 ppd  Provider: Dr. Olivia Mackie [name]-[facility]

## 2020-03-26 ENCOUNTER — Other Ambulatory Visit: Payer: Self-pay

## 2020-03-26 ENCOUNTER — Ambulatory Visit: Payer: PPO | Admitting: Podiatry

## 2020-03-26 DIAGNOSIS — M79672 Pain in left foot: Secondary | ICD-10-CM

## 2020-03-26 DIAGNOSIS — M795 Residual foreign body in soft tissue: Secondary | ICD-10-CM

## 2020-03-26 NOTE — Progress Notes (Signed)
   HPI: 69 y.o. male presenting today for new complaint regarding pain to the left forefoot.  Pain is been ongoing for approximately 1 week now.  Patient's wife states that she believes he stepped on a piece of glass.  He presents for further treatment and evaluation  Past Medical History:  Diagnosis Date  . Allergy   . Arthritis   . Carpal tunnel syndrome on left   . Chest pain of uncertain etiology 10/24/2019  . CTS (carpal tunnel syndrome)    left  . DDD (degenerative disc disease), thoracolumbar    unknown location   . DDD (degenerative disc disease), thoracolumbar    multilevel  . Degenerative joint disease of left shoulder   . Degenerative joint disease of left shoulder    02/2011   . Diverticulosis    left colon (2008)   . ED (erectile dysfunction)    02/2011   . ED (erectile dysfunction)   . GERD (gastroesophageal reflux disease)   . H. pylori infection   . H. pylori infection   . Hyperlipidemia   . Hypertension   . Insomnia   . Lactose intolerance   . Lactose intolerance   . Low back pain   . PVC (premature ventricular contraction) 10/24/2019  . Shingles    07/2018  . Smoking    smoking since age 89 y.o  . Toe fracture, left    4th in 2014  . Vitamin D deficiency   . Vitamin D deficiency      Physical Exam: General: The patient is alert and oriented x3 in no acute distress.  Dermatology: Skin is warm, dry and supple bilateral lower extremities. Negative for open lesions or macerations.  There is some hyperkeratotic callus tissue with some drainage noted to the left forefoot.  With light debridement and expression of the area a piece of glass came out approximately 3 mm in length.  No purulence noted.  No sign of infection  Vascular: Palpable pedal pulses bilaterally. No edema or erythema noted. Capillary refill within normal limits.  Neurological: Epicritic and protective threshold grossly intact bilaterally.   Musculoskeletal Exam: Range of motion within  normal limits to all pedal and ankle joints bilateral. Muscle strength 5/5 in all groups bilateral.   Radiographic Exam:  Normal osseous mineralization. Joint spaces preserved. No fracture/dislocation/boney destruction.    Assessment: 1.  Foreign body glass fragment left forefoot   Plan of Care:  1. Patient evaluated. X-Rays reviewed.  2.  Debridement was performed and the glass was removed from the subcutaneous tissue of the foot.  Light dressing applied. 3.  Recommend antibiotic ointment a Band-Aid daily 4.  Recommend not going barefoot and wearing good supportive shoes to protect the feet 5.  Return to clinic as needed  *Retired.  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 Felecia Shelling, DPM    [address] [place], [place] [address]                Office [phone]  Fax [phone]

## 2020-06-19 ENCOUNTER — Ambulatory Visit: Payer: PPO | Admitting: Internal Medicine

## 2020-07-03 ENCOUNTER — Other Ambulatory Visit: Payer: Self-pay

## 2020-07-03 ENCOUNTER — Ambulatory Visit: Payer: PPO | Admitting: Podiatry

## 2020-07-03 ENCOUNTER — Other Ambulatory Visit: Payer: Self-pay | Admitting: *Deleted

## 2020-07-03 ENCOUNTER — Encounter: Payer: Self-pay | Admitting: Podiatry

## 2020-07-03 DIAGNOSIS — M79672 Pain in left foot: Secondary | ICD-10-CM

## 2020-07-03 DIAGNOSIS — B351 Tinea unguium: Secondary | ICD-10-CM | POA: Diagnosis not present

## 2020-07-03 DIAGNOSIS — Q828 Other specified congenital malformations of skin: Secondary | ICD-10-CM | POA: Diagnosis not present

## 2020-07-03 DIAGNOSIS — M79676 Pain in unspecified toe(s): Secondary | ICD-10-CM

## 2020-07-03 NOTE — Patient Instructions (Signed)
You may purchase Dr. Edwena Felty reusable U-shaped callus pad (blue gel type) or felt U-shaped callus pad (white in color; these are the ones you have in your shoe) at   [email or web address] or [email or web address]

## 2020-07-04 NOTE — Progress Notes (Signed)
Subjective:  Patient ID: John Keith, male    DOB: 02/17/1951,  MRN: [id number]  69 y.o. male presents with painful thick toenails that are difficult to trim. Pain interferes with ambulation. Aggravating factors include wearing enclosed shoe gear. Pain is relieved with periodic professional debridement.Marland Kitchen  He also has painful lesion plantar aspect of left foot which makes ambulation difficult.  Past Medical History:  Diagnosis Date  . Allergy   . Arthritis   . Carpal tunnel syndrome on left   . Chest pain of uncertain etiology 10/24/2019  . CTS (carpal tunnel syndrome)    left  . DDD (degenerative disc disease), thoracolumbar    unknown location   . DDD (degenerative disc disease), thoracolumbar    multilevel  . Degenerative joint disease of left shoulder   . Degenerative joint disease of left shoulder    02/2011   . Diverticulosis    left colon (2008)   . ED (erectile dysfunction)    02/2011   . ED (erectile dysfunction)   . GERD (gastroesophageal reflux disease)   . H. pylori infection   . H. pylori infection   . Hyperlipidemia   . Hypertension   . Insomnia   . Lactose intolerance   . Lactose intolerance   . Low back pain   . PVC (premature ventricular contraction) 10/24/2019  . Shingles    07/2018  . Smoking    smoking since age 24 y.o  . Toe fracture, left    4th in 2014  . Vitamin D deficiency   . Vitamin D deficiency    Past Surgical History:  Procedure Laterality Date  . APPENDECTOMY    . BACK SURGERY     x 2 10991 and 1995 in [place]   . CARDIAC CATHETERIZATION     1999 nl  . COLONOSCOPY     2008 diverticulosis  . ESOPHAGOGASTRODUODENOSCOPY     h/o +h. pylori  . OTHER SURGICAL HISTORY     heart catherization 1999 normal   . SPINE SURGERY     x 2    Patient Active Problem List   Diagnosis Date Noted  . 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  . Anemia 12/13/2019  . Chest pain of uncertain etiology 10/24/2019  . PVC (premature ventricular contraction)  10/24/2019  . Gastric ulcer without hemorrhage or perforation 09/28/2019  . COVID-19 virus detected 07/12/2019  . COVID-19 07/07/2019  . Elevated troponin 07/07/2019  . Postherpetic neuralgia 08/17/2018  . Benign prostatic hyperplasia 08/01/2018  . Hip pain 06/29/2018  . Aortic atherosclerosis (HCC) 09/22/2016  . Fatigue 06/03/2016  . Lower urinary tract symptoms (LUTS) 06/03/2016  . Constipation 03/10/2016  . Lumbar radiculopathy 02/20/2016  . Encounter for immunization 11/26/2015  . Insomnia 06/10/2015  . External hemorrhoid 03/07/2015  . Health care maintenance 01/09/2014  . Carpal tunnel syndrome of left wrist 01/03/2013  . Hyperlipidemia 11/09/2012  . Vitamin D deficiency 11/09/2012  . Essential hypertension 11/06/2012  . Tobacco abuse 11/06/2012  . Seasonal allergies 11/06/2012  . Erectile dysfunction 11/06/2012  . GERD (gastroesophageal reflux disease) 11/06/2012  . DDD (degenerative disc disease), thoracolumbar 11/06/2012  . Lactose intolerance 11/06/2012  . Diverticulosis of colon without hemorrhage 11/06/2012    Current Outpatient Medications:  .  amLODipine (NORVASC) 2.5 MG tablet, Take 2.5 mg by mouth daily., Disp: , Rfl:  .  amLODipine (NORVASC) 5 MG tablet, Take 1 tablet (5 mg total) by mouth daily. Goal BP less than 130 (top)/less than 80 (bottom), Disp: 90 tablet, Rfl: 3 .  chlorthalidone (HYGROTON) 25 MG tablet, Take 0.5 tablets (12.5 mg total) by mouth daily. In am If BP not <130/<80 contact office, Disp: 45 tablet, Rfl: 2 .  Cholecalciferol (VITAMIN D-3) 125 MCG (5000 UT) TABS, Take by mouth., Disp: , Rfl:  .  ezetimibe (ZETIA) 10 MG tablet, Take 1 tablet (10 mg total) by mouth daily., Disp: 90 tablet, Rfl: 3 .  ferrous sulfate 325 (65 FE) MG tablet, Take 325 mg by mouth daily with breakfast., Disp: , Rfl:  .  FLUZONE HIGH-DOSE QUADRIVALENT 0.7 ML SUSY, , Disp: , Rfl:  .  losartan (COZAAR) 100 MG tablet, Take 1 tablet (100 mg total) by mouth daily. In am,  Disp: 90 tablet, Rfl: 3 .  metoprolol tartrate (LOPRESSOR) 100 MG tablet, TAKE 1 TABLET 2 HOURS PRIOR TO CT, Disp: 1 tablet, Rfl: 0 .  Multiple Vitamin (MULTIVITAMIN) tablet, Take 1 tablet by mouth daily., Disp: , Rfl:  .  pantoprazole (PROTONIX) 40 MG tablet, Take 1 tablet (40 mg total) by mouth daily. 30 min before food, Disp: 90 tablet, Rfl: 3 .  triamcinolone cream (KENALOG) 0.1 %, Apply 1 application topically 2 (two) times daily as needed. Back itching, Disp: 454 g, Rfl: 11 .  VITAMIN D, CHOLECALCIFEROL, PO, Take by mouth., Disp: , Rfl:  Allergies  Allergen Reactions  . Crestor [Rosuvastatin Calcium]     Elevated muscle enzymes   . Lipitor [Atorvastatin Calcium]     Elevated muscle enzymes   . Tomato Itching   Social History   Occupational History  . Not on file  Tobacco Use  . Smoking status: Current Every Day Smoker    Packs/day: 0.50    Start date: 07/06/1971  . Smokeless tobacco: Never Used  . Tobacco comment: cutting back  Substance and Sexual Activity  . Alcohol use: No    Alcohol/week: 0.0 standard drinks  . Drug use: No  . Sexual activity: Not on file     Review of Systems: Negative except as noted in the HPI.  Objective:   Constitutional Pt is a pleasant 69 y.o. African American male WD, WN in NAD. AAO x 3.   Vascular Capillary refill time to digits immediate b/l. Palpable pedal pulses b/l LE. Pedal hair present. Lower extremity skin temperature gradient within normal limits. No pain with calf compression b/l. No edema noted b/l lower extremities. No cyanosis or clubbing noted.  Neurologic Normal speech. Protective sensation intact 5/5 intact bilaterally with 10g monofilament b/l. Vibratory sensation intact b/l. Proprioception intact bilaterally. Clonus negative b/l.  Dermatologic Pedal skin with normal turgor, texture and tone bilaterally. No open wounds bilaterally. No interdigital macerations bilaterally. Toenails 1-5 b/l elongated, discolored, dystrophic,  thickened, crumbly with subungual debris and tenderness to dorsal palpation. Porokeratotic lesions submet head 2 left foot with tenderness to palpation. No erythema, no edema, no drainage, no flocculence.  Orthopedic: Normal muscle strength 5/5 to all lower extremity muscle groups bilaterally. No pain crepitus or joint limitation noted with ROM b/l. No gross bony deformities bilaterally.   Radiographs: None Assessment:   1. Pain due to onychomycosis of toenail   2. Porokeratosis   3. Pain in left foot    Plan:  Patient was evaluated and treated and all questions answered.  Onychomycosis with pain -Nails palliatively debridement as below. -Educated on self-care  Procedure: Nail Debridement Rationale: Pain Type of Debridement: manual, sharp debridement. Instrumentation: Nail nipper, rotary burr. Number of Nails: 10  -Examined patient. -Patient to continue soft, supportive shoe gear daily. -  Toenails 1-5 b/l were debrided in length and girth with sterile nail nippers and dremel without iatrogenic bleeding.  -Porokeratosis submet head 2nd left foot pared and enucleated with sterile scalpel blade without incident. U-shaped pad applied to the insole of his sneaker. AVS given with the following information: Reusable non-medicated u-shaped gel callus cushion can be purchased at [email or web address] or [employer].com -Patient to report any pedal injuries to medical professional immediately. -Patient/POA to call should there be question/concern in the interim.  Return in about 3 months (around 10/01/2020).  Freddie Breech, DPM

## 2020-07-10 ENCOUNTER — Encounter: Payer: Self-pay | Admitting: Internal Medicine

## 2020-07-10 ENCOUNTER — Other Ambulatory Visit: Payer: Self-pay

## 2020-07-10 ENCOUNTER — Ambulatory Visit (INDEPENDENT_AMBULATORY_CARE_PROVIDER_SITE_OTHER): Payer: PPO | Admitting: Internal Medicine

## 2020-07-10 ENCOUNTER — Ambulatory Visit (INDEPENDENT_AMBULATORY_CARE_PROVIDER_SITE_OTHER): Payer: PPO

## 2020-07-10 VITALS — BP 150/70 | HR 69 | Temp 98.4°F | Ht >= 80 in | Wt 247.0 lb

## 2020-07-10 DIAGNOSIS — K259 Gastric ulcer, unspecified as acute or chronic, without hemorrhage or perforation: Secondary | ICD-10-CM | POA: Diagnosis not present

## 2020-07-10 DIAGNOSIS — E785 Hyperlipidemia, unspecified: Secondary | ICD-10-CM | POA: Diagnosis not present

## 2020-07-10 DIAGNOSIS — G8929 Other chronic pain: Secondary | ICD-10-CM

## 2020-07-10 DIAGNOSIS — I10 Essential (primary) hypertension: Secondary | ICD-10-CM | POA: Diagnosis not present

## 2020-07-10 DIAGNOSIS — M25512 Pain in left shoulder: Secondary | ICD-10-CM | POA: Diagnosis not present

## 2020-07-10 DIAGNOSIS — R109 Unspecified abdominal pain: Secondary | ICD-10-CM

## 2020-07-10 DIAGNOSIS — Z1389 Encounter for screening for other disorder: Secondary | ICD-10-CM | POA: Diagnosis not present

## 2020-07-10 DIAGNOSIS — M19012 Primary osteoarthritis, left shoulder: Secondary | ICD-10-CM | POA: Diagnosis not present

## 2020-07-10 DIAGNOSIS — E559 Vitamin D deficiency, unspecified: Secondary | ICD-10-CM | POA: Diagnosis not present

## 2020-07-10 LAB — CBC WITH DIFFERENTIAL/PLATELET
Basophils Absolute: 0 10*3/uL (ref 0.0–0.1)
Basophils Relative: 0.5 % (ref 0.0–3.0)
Eosinophils Absolute: 0.2 10*3/uL (ref 0.0–0.7)
Eosinophils Relative: 3.3 % (ref 0.0–5.0)
HCT: 39.1 % (ref 39.0–52.0)
Hemoglobin: 12.9 g/dL — ABNORMAL LOW (ref 13.0–17.0)
Lymphocytes Relative: 33.3 % (ref 12.0–46.0)
Lymphs Abs: 2.3 10*3/uL (ref 0.7–4.0)
MCHC: 33 g/dL (ref 30.0–36.0)
MCV: 94.7 fl (ref 78.0–100.0)
Monocytes Absolute: 0.6 10*3/uL (ref 0.1–1.0)
Monocytes Relative: 9.3 % (ref 3.0–12.0)
Neutro Abs: 3.7 10*3/uL (ref 1.4–7.7)
Neutrophils Relative %: 53.6 % (ref 43.0–77.0)
Platelets: 267 10*3/uL (ref 150.0–400.0)
RBC: 4.13 Mil/uL — ABNORMAL LOW (ref 4.22–5.81)
RDW: 13.3 % (ref 11.5–15.5)
WBC: 6.9 10*3/uL (ref 4.0–10.5)

## 2020-07-10 LAB — COMPREHENSIVE METABOLIC PANEL
ALT: 26 U/L (ref 0–53)
AST: 26 U/L (ref 0–37)
Albumin: 4.4 g/dL (ref 3.5–5.2)
Alkaline Phosphatase: 81 U/L (ref 39–117)
BUN: 12 mg/dL (ref 6–23)
CO2: 30 mEq/L (ref 19–32)
Calcium: 9.3 mg/dL (ref 8.4–10.5)
Chloride: 103 mEq/L (ref 96–112)
Creatinine, Ser: 1.1 mg/dL (ref 0.40–1.50)
GFR: 67.92 mL/min (ref >60.00)
Glucose, Bld: 82 mg/dL (ref 70–99)
Potassium: 4 mEq/L (ref 3.5–5.1)
Sodium: 139 mEq/L (ref 135–145)
Total Bilirubin: 0.4 mg/dL (ref 0.2–1.2)
Total Protein: 7.1 g/dL (ref 6.0–8.3)

## 2020-07-10 LAB — VITAMIN D 25 HYDROXY (VIT D DEFICIENCY, FRACTURES): VITD: 70.94 ng/mL (ref 30.00–100.00)

## 2020-07-10 MED ORDER — PANTOPRAZOLE SODIUM 40 MG PO TBEC: 40.0000 mg | DELAYED_RELEASE_TABLET | Freq: Every day | ORAL | 3 refills | Status: DC

## 2020-07-10 MED ORDER — EZETIMIBE 10 MG PO TABS: 10.0000 mg | ORAL_TABLET | Freq: Every day | ORAL | 3 refills | Status: DC

## 2020-07-10 MED ORDER — LOSARTAN POTASSIUM-HCTZ 100-12.5 MG PO TABS: 1.0000 | ORAL_TABLET | Freq: Every day | ORAL | 3 refills | Status: DC

## 2020-07-10 MED ORDER — AMLODIPINE BESYLATE 5 MG PO TABS: 5.0000 mg | ORAL_TABLET | Freq: Every day | ORAL | 3 refills | Status: DC

## 2020-07-10 MED ORDER — TRAMADOL HCL 50 MG PO TABS: 50.0000 mg | ORAL_TABLET | Freq: Two times a day (BID) | ORAL | 0 refills | Status: AC | PRN

## 2020-07-10 NOTE — Progress Notes (Signed)
Chief Complaint  Patient presents with   Follow-up   F/u  1. htn elevated only on losartan 100 mg qd and norvasc ? Dose 2.5/5 mg qd not taking chlorthalidone 12.5 mg qd since range out of refills  Will simplify meds for pt  2. C/o left shoulder pain 8/10 x 4-5 months since getting all 3 covid vaccines in this arm tylenol extra strength not helping enough and still has pain but helps some. Posterior left shoulder hurts as well   FINDINGS: There is no acute displaced fracture or dislocation. There are degenerative changes of the left glenohumeral joint and left AC joint.  IMPRESSION: 1. No acute osseous abnormality. 2. Mild degenerative changes are noted.   Electronically Signed   By: Constance Holster M.D.   On: 07/10/2020 14:48  Review of Systems  Constitutional: Negative for weight loss.  HENT: Negative for hearing loss.   Eyes: Negative for blurred vision.  Respiratory: Positive for shortness of breath.   Cardiovascular: Negative for chest pain.  Gastrointestinal: Negative for abdominal pain.  Musculoskeletal: Positive for joint pain.  Skin: Negative for rash.  Psychiatric/Behavioral: Negative for memory loss.   Past Medical History:  Diagnosis Date   Allergy    Arthritis    Carpal tunnel syndrome on left    Chest pain of uncertain etiology 0/27/7412   CTS (carpal tunnel syndrome)    left   DDD (degenerative disc disease), thoracolumbar    unknown location    DDD (degenerative disc disease), thoracolumbar    multilevel   Degenerative joint disease of left shoulder    Degenerative joint disease of left shoulder    02/2011    Diverticulosis    left colon (2008)    ED (erectile dysfunction)    02/2011    ED (erectile dysfunction)    GERD (gastroesophageal reflux disease)    H. pylori infection    H. pylori infection    Hyperlipidemia    Hypertension    Insomnia    Lactose intolerance    Lactose intolerance    Low back pain    PVC  (premature ventricular contraction) 10/24/2019   Shingles    07/2018   Smoking    smoking since age 34 y.o   Toe fracture, left    4th in 2014   Vitamin D deficiency    Vitamin D deficiency    Past Surgical History:  Procedure Laterality Date   APPENDECTOMY     BACK SURGERY     x 2 10991 and 1995 in [place] nl   COLONOSCOPY     2008 diverticulosis   ESOPHAGOGASTRODUODENOSCOPY     h/o +h. pylori   OTHER SURGICAL HISTORY     heart catherization 1999 normal    SPINE SURGERY     x 2    Family History  Problem Relation Age of Onset   Heart disease Mother        age 40    Throat cancer Father 14   Cancer Father        throat    Cancer Maternal Uncle        uncle ?maternal or paternal died colon cancer in his 87s    Diabetes Maternal Aunt    Heart attack Maternal Grandmother    Social History   Socioeconomic History   Marital status: Married    Spouse name: Not on file   Number of children: Not on file  Years of education: Not on file   Highest education level: Not on file  Occupational History   Not on file  Tobacco Use   Smoking status: Current Every Day Smoker    Packs/day: 0.50    Start date: 07/06/1971   Smokeless tobacco: Never Used   Tobacco comment: cutting back  Substance and Sexual Activity   Alcohol use: No    Alcohol/week: 0.0 standard drinks   Drug use: No   Sexual activity: Not on file  Other Topics Concern   Not on file  Social History Narrative   Married    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   Owns guns    Wears seat belt, safe in relationship    Smoker    Retired 06/27/2019   Social Determinants of Radio broadcast assistant Strain: Low Risk    Difficulty of Paying Living Expenses: Not hard at all  Food Insecurity: No Food Insecurity   Worried About Charity fundraiser in the Last Year: Never true   Arboriculturist in the Last Year: Never true  Transportation Needs: No  Transportation Needs   Lack of Transportation (Medical): No   Lack of Transportation (Non-Medical): No  Physical Activity: Sufficiently Active   Days of Exercise per Week: 5 days   Minutes of Exercise per Session: 30 min  Stress: No Stress Concern Present   Feeling of Stress : Not at all  Social Connections: Unknown   Frequency of Communication with Friends and Family: More than three times a week   Frequency of Social Gatherings with Friends and Family: More than three times a week   Attends Religious Services: Not on Electrical engineer or Organizations: Yes   Attends Archivist Meetings: Not on file   Marital Status: Married  Human resources officer Violence: Not At Risk   Fear of Current or Ex-Partner: No   Emotionally Abused: No   Physically Abused: No   Sexually Abused: No   Current Meds  Medication Sig   Cholecalciferol (VITAMIN D-3) 125 MCG (5000 UT) TABS Take by mouth.   ferrous sulfate 325 (65 FE) MG tablet Take 325 mg by mouth daily with breakfast.   losartan-hydrochlorothiazide (HYZAAR) 100-12.5 MG tablet Take 1 tablet by mouth daily. In am   Multiple Vitamin (MULTIVITAMIN) tablet Take 1 tablet by mouth daily.   traMADol (ULTRAM) 50 MG tablet Take 1 tablet (50 mg total) by mouth 2 (two) times daily as needed for up to 5 days.   triamcinolone cream (KENALOG) 0.1 % Apply 1 application topically 2 (two) times daily as needed. Back itching   [DISCONTINUED] amLODipine (NORVASC) 2.5 MG tablet Take 2.5 mg by mouth daily.   [DISCONTINUED] amLODipine (NORVASC) 5 MG tablet Take 1 tablet (5 mg total) by mouth daily. Goal BP less than 130 (top)/less than 80 (bottom)   [DISCONTINUED] chlorthalidone (HYGROTON) 25 MG tablet Take 0.5 tablets (12.5 mg total) by mouth daily. In am If BP not <130/<80 contact office   [DISCONTINUED] ezetimibe (ZETIA) 10 MG tablet Take 1 tablet (10 mg total) by mouth daily.   [DISCONTINUED] losartan (COZAAR) 100 MG  tablet Take 1 tablet (100 mg total) by mouth daily. In am   [DISCONTINUED] metoprolol tartrate (LOPRESSOR) 100 MG tablet TAKE 1 TABLET 2 HOURS PRIOR TO CT   [DISCONTINUED] pantoprazole (PROTONIX) 40 MG tablet Take 1 tablet (40 mg total) by mouth daily. 30 min before food   Allergies  Allergen Reactions   Crestor [Rosuvastatin Calcium]     Elevated muscle enzymes    Lipitor [Atorvastatin Calcium]     Elevated muscle enzymes    Tomato Itching   Recent Results (from the past 2160 hour(s))  Comprehensive metabolic panel     Status: None   Collection Time: 07/10/20  2:13 PM  Result Value Ref Range   Sodium 139 135 - 145 mEq/L   Potassium 4.0 3.5 - 5.1 mEq/L   Chloride 103 96 - 112 mEq/L   CO2 30 19 - 32 mEq/L   Glucose, Bld 82 70 - 99 mg/dL   BUN 12 6 - 23 mg/dL   Creatinine, Ser 1.10 0.40 - 1.50 mg/dL   Total Bilirubin 0.4 0.2 - 1.2 mg/dL   Alkaline Phosphatase 81 39 - 117 U/L   AST 26 0 - 37 U/L   ALT 26 0 - 53 U/L   Total Protein 7.1 6.0 - 8.3 g/dL   Albumin 4.4 3.5 - 5.2 g/dL   GFR 67.92 >60.00 mL/min    Comment: Calculated using the CKD-EPI Creatinine Equation (2021)   Calcium 9.3 8.4 - 10.5 mg/dL  CBC with Differential/Platelet     Status: Abnormal   Collection Time: 07/10/20  2:13 PM  Result Value Ref Range   WBC 6.9 4.0 - 10.5 K/uL   RBC 4.13 (L) 4.22 - 5.81 Mil/uL   Hemoglobin 12.9 (L) 13.0 - 17.0 g/dL   HCT 39.1 39.0 - 52.0 %   MCV 94.7 78.0 - 100.0 fl   MCHC 33.0 30.0 - 36.0 g/dL   RDW 13.3 11.5 - 15.5 %   Platelets 267.0 150.0 - 400.0 K/uL   Neutrophils Relative % 53.6 43.0 - 77.0 %   Lymphocytes Relative 33.3 12.0 - 46.0 %   Monocytes Relative 9.3 3.0 - 12.0 %   Eosinophils Relative 3.3 0.0 - 5.0 %   Basophils Relative 0.5 0.0 - 3.0 %   Neutro Abs 3.7 1.4 - 7.7 K/uL   Lymphs Abs 2.3 0.7 - 4.0 K/uL   Monocytes Absolute 0.6 0.1 - 1.0 K/uL   Eosinophils Absolute 0.2 0.0 - 0.7 K/uL   Basophils Absolute 0.0 0.0 - 0.1 K/uL  Vitamin D (25 hydroxy)      Status: None   Collection Time: 07/10/20  2:13 PM  Result Value Ref Range   VITD 70.94 30.00 - 100.00 ng/mL  Urinalysis, Routine w reflex microscopic     Status: None   Collection Time: 07/10/20  2:36 PM  Result Value Ref Range   Color, Urine YELLOW YELLOW   APPearance CLEAR CLEAR   Specific Gravity, Urine 1.018 1.001 - 1.03   pH 7.0 5.0 - 8.0   Glucose, UA NEGATIVE NEGATIVE   Bilirubin Urine NEGATIVE NEGATIVE   Ketones, ur NEGATIVE NEGATIVE   Hgb urine dipstick NEGATIVE NEGATIVE   Protein, ur NEGATIVE NEGATIVE   Nitrite NEGATIVE NEGATIVE   Leukocytes,Ua NEGATIVE NEGATIVE   WBC, UA NONE SEEN 0 - 5 /HPF   RBC / HPF NONE SEEN 0 - 2 /HPF   Squamous Epithelial / LPF NONE SEEN < OR = 5 /HPF   Bacteria, UA NONE SEEN NONE SEEN /HPF   Hyaline Cast NONE SEEN NONE SEEN /LPF   Objective  Body mass index is 26.47 kg/m. Wt Readings from Last 3 Encounters:  07/10/20 247 lb (112 kg)  12/13/19 234 lb 3.2 oz (106.2 kg)  10/22/19 235 lb (106.6 kg)   Temp Readings from Last 3 Encounters:  07/10/20 98.4 F (36.9 C) (Oral)  12/13/19 (!) 97.5 F (36.4 C) (Temporal)  09/28/19 97.7 F (36.5 C) (Temporal)   BP Readings from Last 3 Encounters:  07/10/20 (!) 150/70  12/13/19 136/78  10/25/19 118/75   Pulse Readings from Last 3 Encounters:  07/10/20 69  12/13/19 78  10/25/19 (!) 58    Physical Exam Vitals and nursing note reviewed.  Constitutional:      Appearance: Normal appearance. He is well-developed, well-groomed and overweight.  HENT:     Head: Normocephalic and atraumatic.  Eyes:     Conjunctiva/sclera: Conjunctivae normal.     Pupils: Pupils are equal, round, and reactive to light.  Cardiovascular:     Rate and Rhythm: Normal rate and regular rhythm.     Heart sounds: Normal heart sounds. No murmur heard.   Pulmonary:     Effort: Pulmonary effort is normal.     Breath sounds: Normal breath sounds.  Musculoskeletal:     Left shoulder: Tenderness present. Decreased  range of motion.  Skin:    General: Skin is warm and dry.  Neurological:     General: No focal deficit present.     Mental Status: He is alert and oriented to person, place, and time. Mental status is at baseline.     Gait: Gait normal.  Psychiatric:        Attention and Perception: Attention and perception normal.        Mood and Affect: Mood and affect normal.        Speech: Speech normal.        Behavior: Behavior normal. Behavior is cooperative.        Thought Content: Thought content normal.        Cognition and Memory: Cognition and memory normal.        Judgment: Judgment normal.     Assessment  Plan  Chronic left shoulder pain - Plan: traMADol (ULTRAM) 50 MG tablet, DG Shoulder Left, Ambulatory referral to Orthopedic Surgery  Essential hypertension - Plan: amLODipine (NORVASC) 5 MG tablet, losartan-hydrochlorothiazide (HYZAAR) 100-12.5 MG tablet, Comprehensive metabolic panel, CBC with Differential/Platelet Monitor BP   Hyperlipidemia, unspecified hyperlipidemia type - Plan: ezetimibe (ZETIA) 10 MG tablet  Gastric ulcer without hemorrhage or perforation, unspecified chronicity - Plan: pantoprazole (PROTONIX) 40 MG tablet Abdominal pain, unspecified abdominal location - Plan: pantoprazole (PROTONIX) 40 MG tablet  Vitamin D deficiency - Plan: Vitamin D (25 hydroxy)   Hm Flu shotutd prevnar and pna 23 utd11/30/17 and 02/16/15 Tdap had 06/15/13  shingrixsent to pharmacy h/o shingles covid vx 3/3 pfizer   Hep B immune  PSA3/30/2020 normal -need to check in futur e Hep C neg 06/03/16 D/c MMR check 2/2 cost a1c 5.3 06/11/19  Colon[facility] 11[facility]29[facility]  -had 12/17/16  -ROI sent EGD 2021  EGD 11/15/19 prob healing ulver in duodenum, mild deformity antrum likely from old ulcer disease bx negative, hiatal hernia Dr. Lyda Jester   Tobacco abuse long term-rec smoking cessation1/2 ppd since age 25 y.o max  1 ppd quit 06/26/20 ? pulm referral as of 07/10/20 in the future consider pulm referral   Provider: Dr. Olivia Mackie [name]-Internal Medicine

## 2020-07-10 NOTE — Patient Instructions (Addendum)
voltaren gel over the counter  Tylenol   [facility] [address] in [place] [facility] [place]   Ask wife about the wound clinic    Shoulder Range of Motion Exercises Shoulder range of motion (ROM) exercises are done to keep the shoulder moving freely or to increase movement. They are often recommended for people who have shoulder pain or stiffness or who are recovering from a shoulder surgery. Phase 1 exercises When you are able, do this exercise 1-2 times per day for 30-60 seconds in each direction, or as directed by your health care provider. Pendulum exercise To do this exercise while sitting: 1. Sit in a chair or at the edge of your bed with your feet flat on the floor. 2. Let your affected arm hang down in front of you over the edge of the bed or chair. 3. Relax your shoulder, arm, and hand. 4. Rock your body so your arm gently swings in small circles. You can also use your unaffected arm to start the motion. 5. Repeat changing the direction of the circles, swinging your arm left and right, and swinging your arm forward and back. To do this exercise while standing: 1. Stand next to a sturdy chair or table, and hold on to it with your hand on your unaffected side. 2. Bend forward at the waist. 3. Bend your knees slightly. 4. Relax your shoulder, arm, and hand. 5. While keeping your shoulder relaxed, use body motion to swing your arm in small circles. 6. Repeat changing the direction of the circles, swinging your arm left and right, and swinging your arm forward and back. 7. Between exercises, stand up tall and take a short break to relax your lower back.  Phase 2 exercises Do these exercises 1-2 times per day or as told by your health care provider. Hold each stretch for 30 seconds, and repeat 3 times. Do the exercises with one or both arms as instructed by your health care provider. For these exercises, sit at a table with your hand and arm supported by the table. A chair that  slides easily or has wheels can be helpful. External rotation 1. Turn your chair so that your affected side is nearest to the table. 2. Place your forearm on the table to your side. Bend your elbow about 90 at the elbow (right angle) and place your hand palm facing down on the table. Your elbow should be about 6 inches away from your side. 3. Keeping your arm on the table, lean your body forward. Abduction 1. Turn your chair so that your affected side is nearest to the table. 2. Place your forearm and hand on the table so that your thumb points toward the ceiling and your arm is straight out to your side. 3. Slide your hand out to the side and away from you, using your unaffected arm to do the work. 4. To increase the stretch, you can slide your chair away from the table. Flexion: forward stretch 1. Sit facing the table. Place your hand and elbow on the table in front of you. 2. Slide your hand forward and away from you, using your unaffected arm to do the work. 3. To increase the stretch, you can slide your chair backward. Phase 3 exercises Do these exercises 1-2 times per day or as told by your health care provider. Hold each stretch for 30 seconds, and repeat 3 times. Do the exercises with one or both arms as instructed by your health care provider.  Cross-body stretch: posterior capsule stretch 1. Lift your arm straight out in front of you. 2. Bend your arm 90 at the elbow (right angle) so your forearm moves across your body. 3. Use your other arm to gently pull the elbow across your body, toward your other shoulder. Wall climbs 1. Stand with your affected arm extended out to the side with your hand resting on a door frame. 2. Slide your hand slowly up the door frame. 3. To increase the stretch, step through the door frame. Keep your body upright and do not lean. Wand exercises You will need a cane, a piece of PVC pipe, or a sturdy wooden dowel for wand exercises. Flexion To do this  exercise while standing: 1. Hold the wand with both of your hands, palms down. 2. Using the other arm to help, lift your arms up and over your head, if able. 3. Push upward with your other arm to gently increase the stretch. To do this exercise while lying down: 1. Lie on your back with your elbows resting on the floor and the wand in both your hands. Your hands will be palm down, or pointing toward your feet. 2. Lift your hands toward the ceiling, using your unaffected arm to help if needed. 3. Bring your arms overhead as able, using your unaffected arm to help if needed. Internal rotation 1. Stand while holding the wand behind you with both hands. Your unaffected arm should be extended above your head with the arm of the affected side extended behind you at the level of your waist. The wand should be pointing straight up and down as you hold it. 2. Slowly pull the wand up behind your back by straightening the elbow of your unaffected arm and bending the elbow of your affected arm. External rotation 1. Lie on your back with your affected upper arm supported on a small pillow or rolled towel. When you first do this exercise, keep your upper arm close to your body. Over time, bring your arm up to a 90 angle out to the side. 2. Hold the wand across your stomach and with both hands palm up. Your elbow on your affected side should be bent at a 90 angle. 3. Use your unaffected side to help push your forearm away from you and toward the floor. Keep your elbow on your affected side bent at a 90 angle. Contact a health care provider if you have:  New or increasing pain.  New numbness, tingling, weakness, or discoloration in your arm or hand. This information is not intended to replace advice given to you by your health care provider. Make sure you discuss any questions you have with your health care provider. Document Revised: 08/03/2017 Document Reviewed: 08/03/2017 Elsevier Patient Education   2020 [employer]  Shoulder Exercises Ask your health care provider which exercises are safe for you. Do exercises exactly as told by your health care provider and adjust them as directed. It is normal to feel mild stretching, pulling, tightness, or discomfort as you do these exercises. Stop right away if you feel sudden pain or your pain gets worse. Do not begin these exercises until told by your health care provider. Stretching exercises External rotation and abduction This exercise is sometimes called corner stretch. This exercise rotates your arm outward (external rotation) and moves your arm out from your body (abduction). 1. Stand in a doorway with one of your feet slightly in front of the other. This is called a staggered  stance. If you cannot reach your forearms to the door frame, stand facing a corner of a room. 2. Choose one of the following positions as told by your health care provider: ? Place your hands and forearms on the door frame above your head. ? Place your hands and forearms on the door frame at the height of your head. ? Place your hands on the door frame at the height of your elbows. 3. Slowly move your weight onto your front foot until you feel a stretch across your chest and in the front of your shoulders. Keep your head and chest upright and keep your abdominal muscles tight. 4. Hold for __________ seconds. 5. To release the stretch, shift your weight to your back foot. Repeat __________ times. Complete this exercise __________ times a day. Extension, standing 1. Stand and hold a broomstick, a cane, or a similar object behind your back. ? Your hands should be a little wider than shoulder width apart. ? Your palms should face away from your back. 2. Keeping your elbows straight and your shoulder muscles relaxed, move the stick away from your body until you feel a stretch in your shoulders (extension). ? Avoid shrugging your shoulders while you move the stick. Keep your  shoulder blades tucked down toward the middle of your back. 3. Hold for __________ seconds. 4. Slowly return to the starting position. Repeat __________ times. Complete this exercise __________ times a day. Range-of-motion exercises Pendulum  1. Stand near a wall or a surface that you can hold onto for balance. 2. Bend at the waist and let your left / right arm hang straight down. Use your other arm to support you. Keep your back straight and do not lock your knees. 3. Relax your left / right arm and shoulder muscles, and move your hips and your trunk so your left / right arm swings freely. Your arm should swing because of the motion of your body, not because you are using your arm or shoulder muscles. 4. Keep moving your hips and trunk so your arm swings in the following directions, as told by your health care provider: ? Side to side. ? Forward and backward. ? In clockwise and counterclockwise circles. 5. Continue each motion for __________ seconds, or for as long as told by your health care provider. 6. Slowly return to the starting position. Repeat __________ times. Complete this exercise __________ times a day. Shoulder flexion, standing  1. Stand and hold a broomstick, a cane, or a similar object. Place your hands a little more than shoulder width apart on the object. Your left / right hand should be palm up, and your other hand should be palm down. 2. Keep your elbow straight and your shoulder muscles relaxed. Push the stick up with your healthy arm to raise your left / right arm in front of your body, and then over your head until you feel a stretch in your shoulder (flexion). ? Avoid shrugging your shoulder while you raise your arm. Keep your shoulder blade tucked down toward the middle of your back. 3. Hold for __________ seconds. 4. Slowly return to the starting position. Repeat __________ times. Complete this exercise __________ times a day. Shoulder abduction, standing 1. Stand  and hold a broomstick, a cane, or a similar object. Place your hands a little more than shoulder width apart on the object. Your left / right hand should be palm up, and your other hand should be palm down. 2. Keep your elbow straight  and your shoulder muscles relaxed. Push the object across your body toward your left / right side. Raise your left / right arm to the side of your body (abduction) until you feel a stretch in your shoulder. ? Do not raise your arm above shoulder height unless your health care provider tells you to do that. ? If directed, raise your arm over your head. ? Avoid shrugging your shoulder while you raise your arm. Keep your shoulder blade tucked down toward the middle of your back. 3. Hold for __________ seconds. 4. Slowly return to the starting position. Repeat __________ times. Complete this exercise __________ times a day. Internal rotation  1. Place your left / right hand behind your back, palm up. 2. Use your other hand to dangle an exercise band, a towel, or a similar object over your shoulder. Grasp the band with your left / right hand so you are holding on to both ends. 3. Gently pull up on the band until you feel a stretch in the front of your left / right shoulder. The movement of your arm toward the center of your body is called internal rotation. ? Avoid shrugging your shoulder while you raise your arm. Keep your shoulder blade tucked down toward the middle of your back. 4. Hold for __________ seconds. 5. Release the stretch by letting go of the band and lowering your hands. Repeat __________ times. Complete this exercise __________ times a day. Strengthening exercises External rotation  1. Sit in a stable chair without armrests. 2. Secure an exercise band to a stable object at elbow height on your left / right side. 3. Place a soft object, such as a folded towel or a small pillow, between your left / right upper arm and your body to move your elbow about 4  inches (10 cm) away from your side. 4. Hold the end of the exercise band so it is tight and there is no slack. 5. Keeping your elbow pressed against the soft object, slowly move your forearm out, away from your abdomen (external rotation). Keep your body steady so only your forearm moves. 6. Hold for __________ seconds. 7. Slowly return to the starting position. Repeat __________ times. Complete this exercise __________ times a day. Shoulder abduction  1. Sit in a stable chair without armrests, or stand up. 2. Hold a __________ weight in your left / right hand, or hold an exercise band with both hands. 3. Start with your arms straight down and your left / right palm facing in, toward your body. 4. Slowly lift your left / right hand out to your side (abduction). Do not lift your hand above shoulder height unless your health care provider tells you that this is safe. ? Keep your arms straight. ? Avoid shrugging your shoulder while you do this movement. Keep your shoulder blade tucked down toward the middle of your back. 5. Hold for __________ seconds. 6. Slowly lower your arm, and return to the starting position. Repeat __________ times. Complete this exercise __________ times a day. Shoulder extension 1. Sit in a stable chair without armrests, or stand up. 2. Secure an exercise band to a stable object in front of you so it is at shoulder height. 3. Hold one end of the exercise band in each hand. Your palms should face each other. 4. Straighten your elbows and lift your hands up to shoulder height. 5. Step back, away from the secured end of the exercise band, until the band is tight and  there is no slack. 6. Squeeze your shoulder blades together as you pull your hands down to the sides of your thighs (extension). Stop when your hands are straight down by your sides. Do not let your hands go behind your body. 7. Hold for __________ seconds. 8. Slowly return to the starting position. Repeat  __________ times. Complete this exercise __________ times a day. Shoulder row 1. Sit in a stable chair without armrests, or stand up. 2. Secure an exercise band to a stable object in front of you so it is at waist height. 3. Hold one end of the exercise band in each hand. Position your palms so that your thumbs are facing the ceiling (neutral position). 4. Bend each of your elbows to a 90-degree angle (right angle) and keep your upper arms at your sides. 5. Step back until the band is tight and there is no slack. 6. Slowly pull your elbows back behind you. 7. Hold for __________ seconds. 8. Slowly return to the starting position. Repeat __________ times. Complete this exercise __________ times a day. Shoulder press-ups  1. Sit in a stable chair that has armrests. Sit upright, with your feet flat on the floor. 2. Put your hands on the armrests so your elbows are bent and your fingers are pointing forward. Your hands should be about even with the sides of your body. 3. Push down on the armrests and use your arms to lift yourself off the chair. Straighten your elbows and lift yourself up as much as you comfortably can. ? Move your shoulder blades down, and avoid letting your shoulders move up toward your ears. ? Keep your feet on the ground. As you get stronger, your feet should support less of your body weight as you lift yourself up. 4. Hold for __________ seconds. 5. Slowly lower yourself back into the chair. Repeat __________ times. Complete this exercise __________ times a day. Wall push-ups  1. Stand so you are facing a stable wall. Your feet should be about one arm-length away from the wall. 2. Lean forward and place your palms on the wall at shoulder height. 3. Keep your feet flat on the floor as you bend your elbows and lean forward toward the wall. 4. Hold for __________ seconds. 5. Straighten your elbows to push yourself back to the starting position. Repeat __________ times.  Complete this exercise __________ times a day. This information is not intended to replace advice given to you by your health care provider. Make sure you discuss any questions you have with your health care provider. Document Revised: 10/13/2018 Document Reviewed: 07/21/2018 Elsevier Patient Education  2020 [employer]  Shoulder Impingement Syndrome  Shoulder impingement syndrome is a condition that causes pain when connective tissues (tendons) surrounding the shoulder joint become pinched. These tendons are part of the group of muscles and tissues that help to stabilize the shoulder (rotator cuff). Beneath the rotator cuff is a fluid-filled sac (bursa) that allows the muscles and tendons to glide smoothly. The bursa may become swollen or irritated (bursitis). Bursitis, swelling in the rotator cuff tendons, or both conditions can decrease how much space is under a bone in the shoulder joint (acromion), resulting in impingement. What are the causes? Shoulder impingement syndrome may be caused by bursitis or swelling of the rotator cuff tendons, which may result from:  Repetitive overhead arm movements.  Falling onto the shoulder.  Weakness in the shoulder muscles. What increases the risk? You may be more likely to develop this  condition if you:  Play sports that involve throwing, such as baseball.  Participate in sports such as tennis, volleyball, and swimming.  Work as a Education administrator, Music therapist, or Pharmacologist. Some people are also more likely to develop impingement syndrome because of the shape of their acromion bone. What are the signs or symptoms? The main symptom of this condition is pain on the front or side of the shoulder. The pain may:  Get worse when lifting or raising the arm.  Get worse at night.  Wake you up from sleeping.  Feel sharp when the shoulder is moved and then fade to an ache. Other symptoms may include:  Tenderness.  Stiffness.  Inability to raise the  arm above shoulder level or behind the body.  Weakness. How is this diagnosed? This condition may be diagnosed based on:  Your symptoms and medical history.  A physical exam.  Imaging tests, such as: ? X-rays. ? MRI. ? Ultrasound. How is this treated? This condition may be treated by:  Resting your shoulder and avoiding all activities that cause pain or put stress on the shoulder.  Icing your shoulder.  NSAIDs to help reduce pain and swelling.  One or more injections of medicines to numb the area and reduce inflammation.  Physical therapy.  Surgery. This may be needed if nonsurgical treatments have not helped. Surgery may involve repairing the rotator cuff, reshaping the acromion, or removing the bursa. Follow these instructions at home: Managing pain, stiffness, and swelling   If directed, put ice on the injured area. ? Put ice in a plastic bag. ? Place a towel between your skin and the bag. ? Leave the ice on for 20 minutes, 2-3 times a day. Activity  Rest and return to your normal activities as told by your health care provider. Ask your health care provider what activities are safe for you.  Do exercises as told by your health care provider. General instructions  Do not use any products that contain nicotine or tobacco, such as cigarettes, e-cigarettes, and chewing tobacco. These can delay healing. If you need help quitting, ask your health care provider.  Ask your health care provider when it is safe for you to drive.  Take over-the-counter and prescription medicines only as told by your health care provider.  Keep all follow-up visits as told by your health care provider. This is important. How is this prevented?  Give your body time to rest between periods of activity.  Be safe and responsible while being active. This will help you avoid falls.  Maintain physical fitness, including strength and flexibility. Contact a health care provider if:  Your  symptoms have not improved after 1-2 months of treatment and rest.  You cannot lift your arm away from your body. Summary  Shoulder impingement syndrome is a condition that causes pain when connective tissues (tendons) surrounding the shoulder joint become pinched.  The main symptom of this condition is pain on the front or side of the shoulder.  This condition is usually treated with rest, ice, and pain medicines as needed. This information is not intended to replace advice given to you by your health care provider. Make sure you discuss any questions you have with your health care provider. Document Revised: 10/13/2018 Document Reviewed: 12/14/2017 Elsevier Patient Education  2020 [employer].

## 2020-07-11 DIAGNOSIS — G8929 Other chronic pain: Secondary | ICD-10-CM | POA: Insufficient documentation

## 2020-07-11 DIAGNOSIS — M25512 Pain in left shoulder: Secondary | ICD-10-CM | POA: Insufficient documentation

## 2020-07-11 LAB — URINALYSIS, ROUTINE W REFLEX MICROSCOPIC
Bacteria, UA: NONE SEEN /HPF
Bilirubin Urine: NEGATIVE
Glucose, UA: NEGATIVE
Hgb urine dipstick: NEGATIVE
Hyaline Cast: NONE SEEN /LPF
Ketones, ur: NEGATIVE
Leukocytes,Ua: NEGATIVE
Nitrite: NEGATIVE
Protein, ur: NEGATIVE
RBC / HPF: NONE SEEN /HPF (ref 0–2)
Specific Gravity, Urine: 1.018 (ref 1.001–1.03)
Squamous Epithelial / HPF: NONE SEEN /HPF (ref <=5)
WBC, UA: NONE SEEN /HPF (ref 0–5)
pH: 7 (ref 5.0–8.0)

## 2020-07-18 ENCOUNTER — Telehealth: Payer: Self-pay | Admitting: Internal Medicine

## 2020-07-18 NOTE — Telephone Encounter (Signed)
Mail letter he can call [facility] in [place] for referral tried to call him but mail box full so missed referral

## 2020-07-18 NOTE — Telephone Encounter (Signed)
Rejection Reason - Patient did not respond - Thank you for this referral, unfortunately after several attempts we were not able to reach him "mailbox full"" [place], [place] said on Jul 18, 2020 9:52 AM

## 2020-07-23 NOTE — Telephone Encounter (Signed)
Letter has been sent

## 2020-07-24 DIAGNOSIS — M25512 Pain in left shoulder: Secondary | ICD-10-CM | POA: Insufficient documentation

## 2020-08-11 ENCOUNTER — Ambulatory Visit (INDEPENDENT_AMBULATORY_CARE_PROVIDER_SITE_OTHER): Payer: PPO

## 2020-08-11 VITALS — Ht >= 80 in | Wt 247.0 lb

## 2020-08-11 DIAGNOSIS — Z0000 Encounter for general adult medical examination without abnormal findings: Secondary | ICD-10-CM | POA: Diagnosis not present

## 2020-08-11 NOTE — Progress Notes (Addendum)
Subjective:   John Keith is a 70 y.o. male who presents for Medicare Annual/Subsequent preventive examination.  Review of Systems    No ROS.  Medicare Wellness Virtual Visit.   Cardiac Risk Factors include: advanced age (>33men, >36 women);hypertension;male gender     Objective:    Today's Vitals   08/11/20 0845  Weight: 247 lb (112 kg)  Height: 6\' 9"  (2.057 m)   Body mass index is 26.47 kg/m.  Advanced Directives 08/11/2020 08/09/2019 11/01/2016 10/14/2016 06/03/2016 03/22/2016 03/16/2016  Does Patient Have a Medical Advance Directive? No No No No No No No  Would patient like information on creating a medical advance directive? No - Patient declined No - Patient declined No - Patient declined No - Patient declined No - Patient declined No - patient declined information No - patient declined information  Some encounter information is confidential and restricted. Go to Review Flowsheets activity to see all data.    Current Medications (verified) Outpatient Encounter Medications as of 08/11/2020  Medication Sig  . amLODipine (NORVASC) 5 MG tablet Take 1 tablet (5 mg total) by mouth daily. Goal BP less than 130 (top)/less than 80 (bottom)  . Cholecalciferol (VITAMIN D-3) 125 MCG (5000 UT) TABS Take by mouth.  . ezetimibe (ZETIA) 10 MG tablet Take 1 tablet (10 mg total) by mouth daily.  . ferrous sulfate 325 (65 FE) MG tablet Take 325 mg by mouth daily with breakfast.  . losartan-hydrochlorothiazide (HYZAAR) 100-12.5 MG tablet Take 1 tablet by mouth daily. In am  . Multiple Vitamin (MULTIVITAMIN) tablet Take 1 tablet by mouth daily.  . pantoprazole (PROTONIX) 40 MG tablet Take 1 tablet (40 mg total) by mouth daily. 30 min before food  . triamcinolone cream (KENALOG) 0.1 % Apply 1 application topically 2 (two) times daily as needed. Back itching   No facility-administered encounter medications on file as of 08/11/2020.    Allergies (verified) Crestor [rosuvastatin calcium], Lipitor  [atorvastatin calcium], and Tomato   History: Past Medical History:  Diagnosis Date  . Allergy   . Arthritis   . Carpal tunnel syndrome on left   . Chest pain of uncertain etiology 10/24/2019  . CTS (carpal tunnel syndrome)    left  . DDD (degenerative disc disease), thoracolumbar    unknown location   . DDD (degenerative disc disease), thoracolumbar    multilevel  . Degenerative joint disease of left shoulder   . Degenerative joint disease of left shoulder    02/2011   . Diverticulosis    left colon (2008)   . ED (erectile dysfunction)    02/2011   . ED (erectile dysfunction)   . GERD (gastroesophageal reflux disease)   . H. pylori infection   . H. pylori infection   . Hyperlipidemia   . Hypertension   . Insomnia   . Lactose intolerance   . Lactose intolerance   . Low back pain   . PVC (premature ventricular contraction) 10/24/2019  . Shingles    07/2018  . Smoking    smoking since age 10 y.o  . Toe fracture, left    4th in 2014  . Vitamin D deficiency   . Vitamin D deficiency    Past Surgical History:  Procedure Laterality Date  . APPENDECTOMY    . BACK SURGERY     x 2 10991 and 1995 in [place]   . CARDIAC CATHETERIZATION     1999 nl  . COLONOSCOPY     2008 diverticulosis  .  ESOPHAGOGASTRODUODENOSCOPY     h/o +h. pylori  . OTHER SURGICAL HISTORY     heart catherization 1999 normal   . SPINE SURGERY     x 2    Family History  Problem Relation Age of Onset  . Heart disease Mother        age 74   . Throat cancer Father 40  . Cancer Father        throat   . Cancer Maternal Uncle        uncle ?maternal or paternal died colon cancer in his 75s   . Diabetes Maternal Aunt   . Heart attack Maternal Grandmother    Social History   Socioeconomic History  . Marital status: Married    Spouse name: Not on file  . Number of children: Not on file  . Years of education: Not on file  . Highest education level: Not on file  Occupational History  . Not on file   Tobacco Use  . Smoking status: Former Smoker    Packs/day: 0.50    Start date: 07/06/1971  . Smokeless tobacco: Never Used  . Tobacco comment: 06/26/20 quit date  Substance and Sexual Activity  . Alcohol use: No    Alcohol/week: 0.0 standard drinks  . Drug use: No  . Sexual activity: Not on file  Other Topics Concern  . 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 Difficulty of Paying Living Expenses: Not hard at all  Food Insecurity: No Food Insecurity  . Worried About Programme researcher, broadcasting/film/video in the Last Year: Never true  . Ran Out of Food in the Last Year: Never true  Transportation Needs: No Transportation Needs  . Lack of Transportation (Medical): No  . Lack of Transportation (Non-Medical): No  Physical Activity: Sufficiently Active  . Days of Exercise per Week: 5 days  . Minutes of Exercise per Session: 30 min  Stress: No Stress Concern Present  . Feeling of Stress : Not at all  Social Connections: Unknown  . Frequency of Communication with Friends and Family: More than three times a week  . Frequency of Social Gatherings with Friends and Family: More than three times a week  . Attends Religious Services: Not on file  . Active Member of Clubs or Organizations: Yes  . Attends Banker Meetings: Not on file  . Marital Status: Married    Tobacco Counseling Counseling given: Not Answered Comment: 06/26/20 quit date   Clinical Intake:  Pre-visit preparation completed: Yes        Diabetes: No  How often do you need to have someone help you when you read instructions, pamphlets, or other written materials from your doctor or pharmacy?: 1 - Never    Interpreter Needed?: No      Activities of Daily Living In your present state of health, do you have any  difficulty performing the following activities: 08/11/2020  Hearing? N  Vision? N  Difficulty concentrating or making decisions? N  Walking or climbing stairs? N  Dressing or bathing? N  Doing errands, shopping? N  Preparing Food and eating ? N  Using the Toilet? N  In the past six months, have you  accidently leaked urine? N  Do you have problems with loss of bowel control? N  Managing your Medications? N  Managing your Finances? N  Housekeeping or managing your Housekeeping? N  Some recent data might be hidden    Patient Care Team: [name], Pasty Spillers, MD as PCP - General (Internal Medicine)  Indicate any recent Medical Services you may have received from other than [facility] providers in the past year (date may be approximate).     Assessment:   This is a routine wellness examination for John Keith.  I connected with John Keith today by telephone and verified that I am speaking with the correct person using two identifiers. Location patient: home Location provider: work Persons participating in the virtual visit: patient, Engineer, civil (consulting).    I discussed the limitations, risks, security and privacy concerns of performing an evaluation and management service by telephone and the availability of in person appointments. The patient expressed understanding and verbally consented to this telephonic visit.    Interactive audio and video telecommunications were attempted between this provider and patient, however failed, due to patient having technical difficulties OR patient did not have access to video capability.  We continued and completed visit with audio only.  Some vital signs may be absent or patient reported.   Hearing/Vision screen  Hearing Screening   125Hz  250Hz  500Hz  1000Hz  2000Hz  3000Hz  4000Hz  6000Hz  8000Hz   Right ear:           Left ear:           Comments: Patient is able to hear conversational tones without difficulty.  No issues reported.  Vision Screening Comments: Followed by  [facility] Visual acuity not assessed, virtual visit.  They have seen their ophthalmologist in the last 12 months.    Dietary issues and exercise activities discussed: Current Exercise Habits: Home exercise routine, Type of exercise: walking, Intensity: Mild  Healthy diet Good water intake   Goals      Patient Stated   .  I would like to lose a little weight (pt-stated)      Walk more for exercise      Other   .  Blood Pressure < 140/90      Depression Screen PHQ 2/9 Scores 08/11/2020 12/13/2019 09/28/2019 08/09/2019 07/12/2019 06/07/2019 11/01/2016  PHQ - 2 Score 0 0 0 0 0 0 0  Some encounter information is confidential and restricted. Go to Review Flowsheets activity to see all data.    Fall Risk Fall Risk  08/11/2020 12/13/2019 09/28/2019 08/09/2019 07/12/2019  Falls in the past year? 0 0 0 0 0  Number falls in past yr: 0 0 0 - -  Injury with Fall? 0 0 0 - -  Risk for fall due to : - - - - -  Follow up Falls evaluation completed Falls evaluation completed Falls evaluation completed Falls evaluation completed -  Some encounter information is confidential and restricted. Go to Review Flowsheets activity to see all data.    FALL RISK PREVENTION PERTAINING TO THE HOME: Handrails in use when climbing stairs? Yes Home free of loose throw rugs in walkways, pet beds, electrical cords, etc? Yes  Adequate lighting in your home to reduce risk of falls? Yes   ASSISTIVE DEVICES UTILIZED TO PREVENT FALLS: Use of a cane, walker or w/c? No   TIMED UP AND GO: Was the test performed? No .    Cognitive Function:    6CIT Screen 08/11/2020 08/09/2019  What Year? 0 points 0 points  What  month? 0 points 0 points  What time? 0 points 0 points  Count back from 20 0 points 0 points  Months in reverse 0 points 0 points  Repeat phrase - 0 points  Total Score - 0    Immunizations Immunization History  Administered Date(s) Administered  . Influenza, High Dose Seasonal PF 03/28/2019  .  Influenza-Unspecified 05/07/2019, 04/25/2020  . Pneumococcal Conjugate-13 06/03/2016  . Pneumococcal Polysaccharide-23 02/06/2015  . Tdap 06/15/2013  . Zoster Recombinat (Shingrix) 06/07/2019   Health Maintenance Health Maintenance  Topic Date Due  . COLON CANCER SCREENING ANNUAL FOBT  08/13/2016  . COVID-19 Vaccine (1) 08/27/2020 (Originally 10/27/1955)  . TETANUS/TDAP  06/16/2023  . COLONOSCOPY (Pts 45-92yrs Insurance coverage will need to be confirmed)  12/18/2026  . INFLUENZA VACCINE  Completed  . Hepatitis C Screening  Completed  . PNA vac Low Risk Adult  Completed   Vision Screening: Recommended annual ophthalmology exams for early detection of glaucoma and other disorders of the eye. Is the patient up to date with their annual eye exam?  Yes   Dental Screening: Recommended annual dental exams for proper oral hygiene.  Community Resource Referral / Chronic Care Management: CRR required this visit?  No   CCM required this visit?  No      Plan:    I have personally reviewed and noted the following in the patient's chart:   . Medical and social history . Use of alcohol, tobacco or illicit drugs  . Current medications and supplements . Functional ability and status . Nutritional status . Physical activity . Advanced directives . List of other physicians . Hospitalizations, surgeries, and ER visits in previous 12 months . Vitals . Screenings to include cognitive, depression, and falls . Referrals and appointments  In addition, I have reviewed and discussed with patient certain preventive protocols, quality metrics, and best practice recommendations. A written personalized care plan for preventive services as well as general preventive health recommendations were provided to patient via mail.     Ashok Pall, LPN   0/0/7622   Agree with plan. Rennie Plowman, NP

## 2020-08-11 NOTE — Patient Instructions (Addendum)
John Keith , Thank you for taking time to come for your Medicare Wellness Visit. I appreciate your ongoing commitment to your health goals. Please review the following plan we discussed and let me know if I can assist you in the future.   These are the goals we discussed: Goals      Patient Stated   .  I would like to lose a little weight (pt-stated)      Walk more for exercise      Other   .  Blood Pressure < 140/90       This is a list of the screening recommended for you and due dates:  Health Maintenance  Topic Date Due  . Stool Blood Test  08/13/2016  . COVID-19 Vaccine (1) 08/27/2020*  . Tetanus Vaccine  06/16/2023  . Colon Cancer Screening  12/18/2026  . Flu Shot  Completed  .  Hepatitis C: One time screening is recommended by Center for Disease Control  (CDC) for  adults born from [birth year] through 1966.   Completed  . Pneumonia vaccines  Completed  *Topic was postponed. The date shown is not the original due date.   Advanced directives: not yet completed  Conditions/risks identified: none new  Follow up in one year for your annual wellness visit.   Preventive Care 17 Years and Older, Male Preventive care refers to lifestyle choices and visits with your health care provider that can promote health and wellness. What does preventive care include?  A yearly physical exam. This is also called an annual well check.  Dental exams once or twice a year.  Routine eye exams. Ask your health care provider how often you should have your eyes checked.  Personal lifestyle choices, including:  Daily care of your teeth and gums.  Regular physical activity.  Eating a healthy diet.  Avoiding tobacco and drug use.  Limiting alcohol use.  Practicing safe sex.  Taking low doses of aspirin every day.  Taking vitamin and mineral supplements as recommended by your health care provider. What happens during an annual well check? The services and screenings done by your  health care provider during your annual well check will depend on your age, overall health, lifestyle risk factors, and family history of disease. Counseling  Your health care provider may ask you questions about your:  Alcohol use.  Tobacco use.  Drug use.  Emotional well-being.  Home and relationship well-being.  Sexual activity.  Eating habits.  History of falls.  Memory and ability to understand (cognition).  Work and work Astronomer. Screening  You may have the following tests or measurements:  Height, weight, and BMI.  Blood pressure.  Lipid and cholesterol levels. These may be checked every 5 years, or more frequently if you are over 37 years old.  Skin check.  Lung cancer screening. You may have this screening every year starting at age 35 if you have a 30-pack-year history of smoking and currently smoke or have quit within the past 15 years.  Fecal occult blood test (FOBT) of the stool. You may have this test every year starting at age 28.  Flexible sigmoidoscopy or colonoscopy. You may have a sigmoidoscopy every 5 years or a colonoscopy every 10 years starting at age 38.  Prostate cancer screening. Recommendations will vary depending on your family history and other risks.  Hepatitis C blood test.  Hepatitis B blood test.  Sexually transmitted disease (STD) testing.  Diabetes screening. This is done by checking your  blood sugar (glucose) after you have not eaten for a while (fasting). You may have this done every 1-3 years.  Abdominal aortic aneurysm (AAA) screening. You may need this if you are a current or former smoker.  Osteoporosis. You may be screened starting at age 43 if you are at high risk. Talk with your health care provider about your test results, treatment options, and if necessary, the need for more tests. Vaccines  Your health care provider may recommend certain vaccines, such as:  Influenza vaccine. This is recommended every  year.  Tetanus, diphtheria, and acellular pertussis (Tdap, Td) vaccine. You may need a Td booster every 10 years.  Zoster vaccine. You may need this after age 77.  Pneumococcal 13-valent conjugate (PCV13) vaccine. One dose is recommended after age 30.  Pneumococcal polysaccharide (PPSV23) vaccine. One dose is recommended after age 57. Talk to your health care provider about which screenings and vaccines you need and how often you need them. This information is not intended to replace advice given to you by your health care provider. Make sure you discuss any questions you have with your health care provider. Document Released: 07/18/2015 Document Revised: 03/10/2016 Document Reviewed: 04/22/2015 Elsevier Interactive Patient Education  2017 [employer].  Fall Prevention in the Home Falls can cause injuries. They can happen to people of all ages. There are many things you can do to make your home safe and to help prevent falls. What can I do on the outside of my home?  Regularly fix the edges of walkways and driveways and fix any cracks.  Remove anything that might make you trip as you walk through a door, such as a raised step or threshold.  Trim any bushes or trees on the path to your home.  Use bright outdoor lighting.  Clear any walking paths of anything that might make someone trip, such as rocks or tools.  Regularly check to see if handrails are loose or broken. Make sure that both sides of any steps have handrails.  Any raised decks and porches should have guardrails on the edges.  Have any leaves, snow, or ice cleared regularly.  Use sand or salt on walking paths during winter.  Clean up any spills in your garage right away. This includes oil or grease spills. What can I do in the bathroom?  Use night lights.  Install grab bars by the toilet and in the tub and shower. Do not use towel bars as grab bars.  Use non-skid mats or decals in the tub or shower.  If you  need to sit down in the shower, use a plastic, non-slip stool.  Keep the floor dry. Clean up any water that spills on the floor as soon as it happens.  Remove soap buildup in the tub or shower regularly.  Attach bath mats securely with double-sided non-slip rug tape.  Do not have throw rugs and other things on the floor that can make you trip. What can I do in the bedroom?  Use night lights.  Make sure that you have a light by your bed that is easy to reach.  Do not use any sheets or blankets that are too big for your bed. They should not hang down onto the floor.  Have a firm chair that has side arms. You can use this for support while you get dressed.  Do not have throw rugs and other things on the floor that can make you trip. What can I do in  the kitchen?  Clean up any spills right away.  Avoid walking on wet floors.  Keep items that you use a lot in easy-to-reach places.  If you need to reach something above you, use a strong step stool that has a grab bar.  Keep electrical cords out of the way.  Do not use floor polish or wax that makes floors slippery. If you must use wax, use non-skid floor wax.  Do not have throw rugs and other things on the floor that can make you trip. What can I do with my stairs?  Do not leave any items on the stairs.  Make sure that there are handrails on both sides of the stairs and use them. Fix handrails that are broken or loose. Make sure that handrails are as long as the stairways.  Check any carpeting to make sure that it is firmly attached to the stairs. Fix any carpet that is loose or worn.  Avoid having throw rugs at the top or bottom of the stairs. If you do have throw rugs, attach them to the floor with carpet tape.  Make sure that you have a light switch at the top of the stairs and the bottom of the stairs. If you do not have them, ask someone to add them for you. What else can I do to help prevent falls?  Wear shoes  that:  Do not have high heels.  Have rubber bottoms.  Are comfortable and fit you well.  Are closed at the toe. Do not wear sandals.  If you use a stepladder:  Make sure that it is fully opened. Do not climb a closed stepladder.  Make sure that both sides of the stepladder are locked into place.  Ask someone to hold it for you, if possible.  Clearly mark and make sure that you can see:  Any grab bars or handrails.  First and last steps.  Where the edge of each step is.  Use tools that help you move around (mobility aids) if they are needed. These include:  Canes.  Walkers.  Scooters.  Crutches.  Turn on the lights when you go into a dark area. Replace any light bulbs as soon as they burn out.  Set up your furniture so you have a clear path. Avoid moving your furniture around.  If any of your floors are uneven, fix them.  If there are any pets around you, be aware of where they are.  Review your medicines with your doctor. Some medicines can make you feel dizzy. This can increase your chance of falling. Ask your doctor what other things that you can do to help prevent falls. This information is not intended to replace advice given to you by your health care provider. Make sure you discuss any questions you have with your health care provider. Document Released: 04/17/2009 Document Revised: 11/27/2015 Document Reviewed: 07/26/2014 Elsevier Interactive Patient Education  2017 [employer].

## 2020-08-22 DIAGNOSIS — M7542 Impingement syndrome of left shoulder: Secondary | ICD-10-CM | POA: Insufficient documentation

## 2020-08-25 DIAGNOSIS — M19012 Primary osteoarthritis, left shoulder: Secondary | ICD-10-CM | POA: Diagnosis not present

## 2020-08-25 DIAGNOSIS — M7542 Impingement syndrome of left shoulder: Secondary | ICD-10-CM | POA: Diagnosis not present

## 2020-10-02 ENCOUNTER — Ambulatory Visit: Payer: PPO | Admitting: Podiatry

## 2020-10-02 ENCOUNTER — Encounter: Payer: Self-pay | Admitting: Podiatry

## 2020-10-02 ENCOUNTER — Other Ambulatory Visit: Payer: Self-pay

## 2020-10-02 DIAGNOSIS — Q828 Other specified congenital malformations of skin: Secondary | ICD-10-CM | POA: Diagnosis not present

## 2020-10-02 DIAGNOSIS — M79676 Pain in unspecified toe(s): Secondary | ICD-10-CM | POA: Diagnosis not present

## 2020-10-02 DIAGNOSIS — B351 Tinea unguium: Secondary | ICD-10-CM

## 2020-10-02 DIAGNOSIS — M79672 Pain in left foot: Secondary | ICD-10-CM | POA: Diagnosis not present

## 2020-10-02 NOTE — Progress Notes (Signed)
  Subjective:  Patient ID: John Keith, male    DOB: Aug 20, 1950,  MRN: [id number]  70 y.o. male presents with painful thick toenails that are difficult to trim. Pain interferes with ambulation. Aggravating factors include wearing enclosed shoe gear. Pain is relieved with periodic professional debridement.Marland Kitchen  He also has painful lesion plantar aspect of left foot which makes ambulation difficult.  Allergies  Allergen Reactions  . Crestor [Rosuvastatin Calcium]     Elevated muscle enzymes   . Lipitor [Atorvastatin Calcium]     Elevated muscle enzymes   . Tomato Itching   Review of Systems: Negative except as noted in the HPI.  Objective:   Constitutional Pt is a pleasant 70 y.o. African American male WD, WN in NAD. AAO x 3.   Vascular Capillary refill time to digits immediate b/l. Palpable pedal pulses b/l LE. Pedal hair present. Lower extremity skin temperature gradient within normal limits. No pain with calf compression b/l. No edema noted b/l lower extremities. No cyanosis or clubbing noted.  Neurologic Normal speech. Protective sensation intact 5/5 intact bilaterally with 10g monofilament b/l. Vibratory sensation intact b/l. Proprioception intact bilaterally. Clonus negative b/l.  Dermatologic Pedal skin with normal turgor, texture and tone bilaterally. No open wounds bilaterally. No interdigital macerations bilaterally. Toenails 1-5 b/l elongated, discolored, dystrophic, thickened, crumbly with subungual debris and tenderness to dorsal palpation. Porokeratotic lesions sub hallux IPJ left foot with tenderness to palpation. No erythema, no edema, no drainage, no fluctuance.  Orthopedic: Normal muscle strength 5/5 to all lower extremity muscle groups bilaterally. No pain crepitus or joint limitation noted with ROM b/l. No gross bony deformities bilaterally.   Radiographs: None Assessment:   1. Pain due to onychomycosis of toenail   2. Porokeratosis   3. Pain in left foot    Plan:   Patient was evaluated and treated and all questions answered.  Onychomycosis with pain -Nails palliatively debridement as below. -Educated on self-care  Procedure: Nail Debridement Rationale: Pain Type of Debridement: manual, sharp debridement. Instrumentation: Nail nipper, rotary burr. Number of Nails: 10  -Examined patient. -Patient to continue soft, supportive shoe gear daily. -Toenails 1-5 b/l were debrided in length and girth with sterile nail nippers and dremel without iatrogenic bleeding.  -Porokeratosis left hallux pared and enucleated with sterile scalpel blade without incident. -Dispensed Silipos digital toe cap. Apply every morning. Remove every evening. -Patient to report any pedal injuries to medical professional immediately. -Patient/POA to call should there be question/concern in the interim.  Return in about 3 months (around 01/01/2021).  Freddie Breech, DPM

## 2020-10-10 ENCOUNTER — Ambulatory Visit: Payer: PPO | Admitting: Internal Medicine

## 2020-10-23 ENCOUNTER — Ambulatory Visit (INDEPENDENT_AMBULATORY_CARE_PROVIDER_SITE_OTHER): Payer: PPO | Admitting: Internal Medicine

## 2020-10-23 ENCOUNTER — Encounter: Payer: Self-pay | Admitting: Internal Medicine

## 2020-10-23 ENCOUNTER — Other Ambulatory Visit: Payer: Self-pay

## 2020-10-23 VITALS — BP 118/70 | HR 78 | Temp 97.3°F | Ht >= 80 in | Wt 249.0 lb

## 2020-10-23 DIAGNOSIS — M25512 Pain in left shoulder: Secondary | ICD-10-CM

## 2020-10-23 DIAGNOSIS — R079 Chest pain, unspecified: Secondary | ICD-10-CM

## 2020-10-23 DIAGNOSIS — G8929 Other chronic pain: Secondary | ICD-10-CM

## 2020-10-23 DIAGNOSIS — I25118 Atherosclerotic heart disease of native coronary artery with other forms of angina pectoris: Secondary | ICD-10-CM | POA: Diagnosis not present

## 2020-10-23 DIAGNOSIS — M545 Low back pain, unspecified: Secondary | ICD-10-CM | POA: Diagnosis not present

## 2020-10-23 DIAGNOSIS — I10 Essential (primary) hypertension: Secondary | ICD-10-CM

## 2020-10-23 DIAGNOSIS — M7542 Impingement syndrome of left shoulder: Secondary | ICD-10-CM

## 2020-10-23 DIAGNOSIS — I493 Ventricular premature depolarization: Secondary | ICD-10-CM

## 2020-10-23 DIAGNOSIS — R937 Abnormal findings on diagnostic imaging of other parts of musculoskeletal system: Secondary | ICD-10-CM | POA: Diagnosis not present

## 2020-10-23 DIAGNOSIS — G629 Polyneuropathy, unspecified: Secondary | ICD-10-CM | POA: Diagnosis not present

## 2020-10-23 DIAGNOSIS — I700 Atherosclerosis of aorta: Secondary | ICD-10-CM

## 2020-10-23 DIAGNOSIS — R7989 Other specified abnormal findings of blood chemistry: Secondary | ICD-10-CM

## 2020-10-23 DIAGNOSIS — I2510 Atherosclerotic heart disease of native coronary artery without angina pectoris: Secondary | ICD-10-CM | POA: Insufficient documentation

## 2020-10-23 DIAGNOSIS — R778 Other specified abnormalities of plasma proteins: Secondary | ICD-10-CM

## 2020-10-23 MED ORDER — GABAPENTIN 300 MG PO CAPS: 300.0000 mg | ORAL_CAPSULE | Freq: Every day | ORAL | 3 refills | Status: DC

## 2020-10-23 NOTE — Progress Notes (Signed)
Chief Complaint  Patient presents with  . Follow-up    3 mo f/u   F/u 1. C/o leg numbness/tingling and pain in legs and feet w/in the last few months nothing tried. He mentioned this to podiatry who rec he contact PCP he had been on gabapentin before for shingles and did not notice this pain on this medication. Declines NCS/EMG for now   H/o abnormal MRI L spine below 07/2018 IMPRESSION: 1. Multilevel spondylosis of the lumbar spine as described. 2. Mild right foraminal narrowing at L2-3, L3-4, and L4-5. 3. Left foraminal narrowing is greatest at L4-5. 4. No significant central canal stenosis on the right. 5. Mild left subarticular narrowing at L4-5. 6. Congenitally short pedicles are noted.     2. H/o HTN BP controlled today on norvasc 5 mg qd, hyzaar 100-12.5  C/o chest pain intermittently with h/o CAD noted CT chest 10/2019  IMPRESSION: 1. Coronary calcium score of 23.1. This was 48th percentile for age and sex matched control.  2. Normal coronary origin with right dominance.  3. Mild calcifications in the proximal RCA, proximal and mid LAD causing minimal stenosis.  4. CAD-RADS 1. Minimal non-obstructive CAD (0-24%). Consider non-atherosclerotic causes of chest pain. Consider preventive therapy and risk factor modification.   Review of Systems  Constitutional: Negative for weight loss.  HENT: Negative for hearing loss.   Eyes: Negative for blurred vision.  Respiratory: Negative for shortness of breath.   Cardiovascular: Positive for chest pain.  Gastrointestinal: Positive for constipation. Negative for abdominal pain.  Musculoskeletal: Positive for back pain and joint pain.  Skin: Negative for rash.  Psychiatric/Behavioral: Negative for memory loss.   Past Medical History:  Diagnosis Date  . Allergy   . Arthritis   . Carpal tunnel syndrome on left   . Chest pain of uncertain etiology 3/66/2947  . CTS (carpal tunnel syndrome)    left  . DDD (degenerative  disc disease), thoracolumbar    unknown location   . DDD (degenerative disc disease), thoracolumbar    multilevel  . Degenerative joint disease of left shoulder   . Degenerative joint disease of left shoulder    02/2011   . Diverticulosis    left colon (2008)   . ED (erectile dysfunction)    02/2011   . ED (erectile dysfunction)   . GERD (gastroesophageal reflux disease)   . H. pylori infection   . H. pylori infection   . Hyperlipidemia   . Hypertension   . Insomnia   . Lactose intolerance   . Lactose intolerance   . Low back pain   . PVC (premature ventricular contraction) 10/24/2019  . Shingles    07/2018  . Smoking    smoking since age 72 y.o  . Toe fracture, left    4th in 2014  . Vitamin D deficiency   . Vitamin D deficiency    Past Surgical History:  Procedure Laterality Date  . APPENDECTOMY    . BACK SURGERY     x 2 10991 and 1995 in [place]   . CARDIAC CATHETERIZATION     1999 nl  . COLONOSCOPY     2008 diverticulosis  . ESOPHAGOGASTRODUODENOSCOPY     h/o +h. pylori  . OTHER SURGICAL HISTORY     heart catherization 1999 normal   . SPINE SURGERY     x 2    Family History  Problem Relation Age of Onset  . Heart disease Mother        age  46   . Throat cancer Father 35  . Cancer Father        throat   . Cancer Maternal Uncle        uncle ?maternal or paternal died colon cancer in his 74s   . Diabetes Maternal Aunt   . Heart attack Maternal Grandmother    Social History   Socioeconomic History  . Marital status: Married    Spouse name: Not on file  . Number of children: Not on file  . Years of education: Not on file  . Highest education level: Not on file  Occupational History  . Not on file  Tobacco Use  . Smoking status: Former Smoker    Packs/day: 0.50    Start date: 07/06/1971  . Smokeless tobacco: Never Used  . Tobacco comment: 06/26/20 quit date  Substance and Sexual Activity  . Alcohol use: No    Alcohol/week: 0.0 standard drinks  . Drug  use: No  . Sexual activity: Not on file  Other Topics Concern  . 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 Difficulty of Paying Living Expenses: Not hard at all  Food Insecurity: No Food Insecurity  . Worried About Charity fundraiser in the Last Year: Never true  . Ran Out of Food in the Last Year: Never true  Transportation Needs: No Transportation Needs  . Lack of Transportation (Medical): No  . Lack of Transportation (Non-Medical): No  Physical Activity: Sufficiently Active  . Days of Exercise per Week: 5 days  . Minutes of Exercise per Session: 30 min  Stress: No Stress Concern Present  . Feeling of Stress : Not at all  Social Connections: Unknown  . Frequency of Communication with Friends and Family: More than three times a week  . Frequency of Social Gatherings with Friends and Family: More than three times a week  . Attends Religious Services: Not on file  . Active Member of Clubs or Organizations: Yes  . Attends Archivist Meetings: Not on file  . Marital Status: Married  Human resources officer Violence: Not At Risk  . Fear of Current or Ex-Partner: No  . Emotionally Abused: No  . Physically Abused: No  . Sexually Abused: No   Current Meds  Medication Sig  . amLODipine (NORVASC) 5 MG tablet Take 1 tablet (5 mg total) by mouth daily. Goal BP less than 130 (top)/less than 80 (bottom)  . Cholecalciferol (VITAMIN D-3) 125 MCG (5000 UT) TABS Take by mouth.  . ezetimibe (ZETIA) 10 MG tablet Take 1 tablet (10 mg total) by mouth daily.  . ferrous sulfate 325 (65 FE) MG tablet Take 325 mg by mouth daily with breakfast.  . gabapentin (NEURONTIN) 300 MG capsule Take 1-2 capsules (300-600 mg total) by mouth at bedtime. 300 mg at night x 2 weeks can increase to 2 pills at night  week 3 and beyond at night  . losartan-hydrochlorothiazide (HYZAAR) 100-12.5 MG tablet Take 1 tablet by mouth daily. In am  . meloxicam (MOBIC) 15 MG tablet meloxicam 15 mg tablet  . Multiple Vitamin (MULTIVITAMIN) tablet Take 1 tablet by mouth daily.  . pantoprazole (PROTONIX) 40 MG tablet Take 1 tablet (40 mg  total) by mouth daily. 30 min before food  . triamcinolone cream (KENALOG) 0.1 % Apply 1 application topically 2 (two) times daily as needed. Back itching   Allergies  Allergen Reactions  . Crestor [Rosuvastatin Calcium]     Elevated muscle enzymes   . Lipitor [Atorvastatin Calcium]     Elevated muscle enzymes   . Tomato Itching   No results found for this or any previous visit (from the past 2160 hour(s)). Objective  Body mass index is 26.68 kg/m. Wt Readings from Last 3 Encounters:  10/23/20 249 lb (112.9 kg)  08/11/20 247 lb (112 kg)  07/10/20 247 lb (112 kg)   Temp Readings from Last 3 Encounters:  10/23/20 (!) 97.3 F (36.3 C)  07/10/20 98.4 F (36.9 C) (Oral)  12/13/19 (!) 97.5 F (36.4 C) (Temporal)   BP Readings from Last 3 Encounters:  10/23/20 118/70  07/10/20 (!) 150/70  12/13/19 136/78   Pulse Readings from Last 3 Encounters:  10/23/20 78  07/10/20 69  12/13/19 78    Physical Exam Vitals and nursing note reviewed.  Constitutional:      Appearance: Normal appearance. He is well-developed and well-groomed.  HENT:     Head: Normocephalic and atraumatic.  Eyes:     Conjunctiva/sclera: Conjunctivae normal.     Pupils: Pupils are equal, round, and reactive to light.  Cardiovascular:     Rate and Rhythm: Normal rate and regular rhythm.     Heart sounds: Normal heart sounds. No murmur heard.   Pulmonary:     Effort: Pulmonary effort is normal.     Breath sounds: Normal breath sounds.  Skin:    General: Skin is warm and dry.  Neurological:     General: No focal deficit present.     Mental Status: He is alert and oriented to person, place,  and time. Mental status is at baseline.     Gait: Gait normal.  Psychiatric:        Attention and Perception: Attention and perception normal.        Mood and Affect: Mood and affect normal.        Speech: Speech normal.        Behavior: Behavior normal. Behavior is cooperative.        Thought Content: Thought content normal.        Cognition and Memory: Cognition and memory normal.        Judgment: Judgment normal.     Assessment  Plan  Neuropathy - Plan: gabapentin (NEURONTIN) 300 MG capsule qhs x 2 weeks can increase to 600 mg qhs prn Pt declines NCS/EMG for now consider in future  Chest pain with h/o Coronary artery disease of native artery of native heart with stable angina pectoris ([place]) CT cardiac  10/2019 IMPRESSION: 1. Coronary calcium score of 23.1. This was 48th percentile for age and sex matched control.  2. Normal coronary origin with right dominance.  3. Mild calcifications in the proximal RCA, proximal and mid LAD causing minimal stenosis.  4. CAD-RADS 1. Minimal non-obstructive CAD (0-24%). Consider non-atherosclerotic causes of chest pain. Consider preventive therapy and risk factor modification.  -refer back to cards Dr. [name] further w/u in [place]  BP controlled today on hyzaar 100-12.5 mg qd and norvasc 5 mg qd   Chronic left shoulder pain with Impingement syndrome of left shoulder  Chronic low back pain, unspecified back pain laterality, unspecified whether sciatica present Abnormal MRI, lumbar spine -f/u [facility] had steroid shot  -  refer to PT in [facility] Flu shotutd prevnar and pna 23 utd11/30/17 and 02/16/15 Tdap had 06/15/13  shingrixsent to pharmacy h/o shingles had 1/2 check on 2nd dose covid vx3/3 pfizerdisc 4th dose   Hep B immune  PSA1.28 12/13/19 -need to check in futur e Hep C neg 06/03/16 D/c MMR check 2/2 cost a1c 5.3 06/11/19  Colonoscopy 6/15/18Dr. Meisenhemer [place] -had 12/17/16  EGD  11/15/19 prob healing ulver in duodenum, mild deformity antrum likely from old ulcer disease bx negative, hiatal hernia Dr. Lyda Jester  Tobacco abuse long term-rec smoking cessation1/2 ppd since age 77 y.o max 1 ppd quit 06/26/20 ? pulm referral as of 07/10/20 in the future consider pulm referral   Provider: Dr. Olivia Mackie [name]-Internal Medicine

## 2020-10-23 NOTE — Patient Instructions (Addendum)
Consider 4th covid vaccine 11/05/20  Alpha lipoic acid 600 mg 2x per day  Allegra, or claritin or xyzal or zyrtec for allergies   Constipation Miralax as needed with colace capsules   [facility] [place]  [phone] [address]  Nonspecific Chest Pain, Adult Chest pain can be caused by many different conditions. It can be caused by a condition that is life-threatening and requires treatment right away. It can also be caused by something that is not life-threatening. If you have chest pain, it can be hard to know the difference, so it is important to get help right away to make sure that you do not have a serious condition. Some life-threatening causes of chest pain include:  Heart attack.  A tear in the body's main blood vessel (aortic dissection).  Inflammation around your heart (pericarditis).  A problem in the lungs, such as a blood clot (pulmonary embolism) or a collapsed lung (pneumothorax). Some non life-threatening causes of chest pain include:  Heartburn.  Anxiety or stress.  Damage to the bones, muscles, and cartilage that make up your chest wall.  Pneumonia or bronchitis.  Shingles infection (varicella-zoster virus). Chest pain can feel like:  Pain or discomfort on the surface of your chest or deep in your chest.  Crushing, pressure, aching, or squeezing pain.  Burning or tingling.  Dull or sharp pain that is worse when you move, cough, or take a deep breath.  Pain or discomfort that is also felt in your back, neck, jaw, shoulder, or arm, or pain that spreads to any of these areas. Your chest pain may come and go. It may also be constant. Your health care provider will do lab tests and other studies to find the cause of your pain. Treatment will depend on the cause of your chest pain. Follow these instructions at home: Medicines  Take over-the-counter and prescription medicines only as told by your health care  provider.  If you were prescribed an antibiotic, take it as told by your health care provider. Do not stop taking the antibiotic even if you start to feel better. Lifestyle  Rest as directed by your health care provider.  Do not use any products that contain nicotine or tobacco, such as cigarettes and e-cigarettes. If you need help quitting, ask your health care provider.  Do not drink alcohol.  Make healthy lifestyle choices as recommended. These may include: ? Getting regular exercise. Ask your health care provider to suggest some activities that are safe for you. ? Eating a heart-healthy diet. This includes plenty of fresh fruits and vegetables, whole grains, low-fat (lean) protein, and low-fat dairy products. A dietitian can help you find healthy eating options. ? Maintaining a healthy weight. ? Managing any other health conditions you have, such as high blood pressure (hypertension) or diabetes. ? Reducing stress, such as with yoga or relaxation techniques.   General instructions  Pay attention to any changes in your symptoms. Tell your health care provider about them or any new symptoms.  Avoid any activities that cause chest pain.  Keep all follow-up visits as told by your health care provider. This is important. This includes visits for any further testing if your chest pain does not go away. Contact a health care provider if:  Your chest pain does not go away.  You feel depressed.  You have a fever. Get help right away if:  Your chest pain gets worse.  You have a  cough that gets worse, or you cough up blood.  You have severe pain in your abdomen.  You faint.  You have sudden, unexplained chest discomfort.  You have sudden, unexplained discomfort in your arms, back, neck, or jaw.  You have shortness of breath at any time.  You suddenly start to sweat, or your skin gets clammy.  You feel nausea or you vomit.  You suddenly feel lightheaded or dizzy.  You  have severe weakness, or unexplained weakness or fatigue.  Your heart begins to beat quickly, or it feels like it is skipping beats. These symptoms may represent a serious problem that is an emergency. Do not wait to see if the symptoms will go away. Get medical help right away. Call your local emergency services (911 in the [place] Do not drive yourself to the hospital. Summary  Chest pain can be caused by a condition that is serious and requires urgent treatment. It may also be caused by something that is not life-threatening.  If you have chest pain, it is very important to see your health care provider. Your health care provider may do lab tests and other studies to find the cause of your pain.  Follow your health care provider's instructions on taking medicines, making lifestyle changes, and getting emergency treatment if symptoms become worse.  Keep all follow-up visits as told by your health care provider. This includes visits for any further testing if your chest pain does not go away. This information is not intended to replace advice given to you by your health care provider. Make sure you discuss any questions you have with your health care provider. Document Revised: 12/22/2017 Document Reviewed: 12/22/2017 Elsevier Patient Education  2021 [employer]   Shoulder Range of Motion Exercises Shoulder range of motion (ROM) exercises are done to keep the shoulder moving freely or to increase movement. They are often recommended for people who have shoulder pain or stiffness or who are recovering from a shoulder surgery. Phase 1 exercises When you are able, do this exercise 1-2 times per day for 30-60 seconds in each direction, or as directed by your health care provider. Pendulum exercise To do this exercise while sitting: 1. Sit in a chair or at the edge of your bed with your feet flat on the floor. 2. Let your affected arm hang down in front of you over the edge of the bed or  chair. 3. Relax your shoulder, arm, and hand. 4. Rock your body so your arm gently swings in small circles. You can also use your unaffected arm to start the motion. 5. Repeat changing the direction of the circles, swinging your arm left and right, and swinging your arm forward and back. To do this exercise while standing: 1. Stand next to a sturdy chair or table, and hold on to it with your hand on your unaffected side. 2. Bend forward at the waist. 3. Bend your knees slightly. 4. Relax your shoulder, arm, and hand. 5. While keeping your shoulder relaxed, use body motion to swing your arm in small circles. 6. Repeat changing the direction of the circles, swinging your arm left and right, and swinging your arm forward and back. 7. Between exercises, stand up tall and take a short break to relax your lower back.   Phase 2 exercises Do these exercises 1-2 times per day or as told by your health care provider. Hold each stretch for 30 seconds, and repeat 3 times. Do the exercises with one or  both arms as instructed by your health care provider. For these exercises, sit at a table with your hand and arm supported by the table. A chair that slides easily or has wheels can be helpful. External rotation 1. Turn your chair so that your affected side is nearest to the table. 2. Place your forearm on the table to your side. Bend your elbow about 90 at the elbow (right angle) and place your hand palm facing down on the table. Your elbow should be about 6 inches away from your side. 3. Keeping your arm on the table, lean your body forward. Abduction 1. Turn your chair so that your affected side is nearest to the table. 2. Place your forearm and hand on the table so that your thumb points toward the ceiling and your arm is straight out to your side. 3. Slide your hand out to the side and away from you, using your unaffected arm to do the work. 4. To increase the stretch, you can slide your chair away from  the table. Flexion: forward stretch 1. Sit facing the table. Place your hand and elbow on the table in front of you. 2. Slide your hand forward and away from you, using your unaffected arm to do the work. 3. To increase the stretch, you can slide your chair backward. Phase 3 exercises Do these exercises 1-2 times per day or as told by your health care provider. Hold each stretch for 30 seconds, and repeat 3 times. Do the exercises with one or both arms as instructed by your health care provider. Cross-body stretch: posterior capsule stretch 1. Lift your arm straight out in front of you. 2. Bend your arm 90 at the elbow (right angle) so your forearm moves across your body. 3. Use your other arm to gently pull the elbow across your body, toward your other shoulder. Wall climbs 1. Stand with your affected arm extended out to the side with your hand resting on a door frame. 2. Slide your hand slowly up the door frame. 3. To increase the stretch, step through the door frame. Keep your body upright and do not lean. Wand exercises You will need a cane, a piece of PVC pipe, or a sturdy wooden dowel for wand exercises. Flexion To do this exercise while standing: 1. Hold the wand with both of your hands, palms down. 2. Using the other arm to help, lift your arms up and over your head, if able. 3. Push upward with your other arm to gently increase the stretch. To do this exercise while lying down: 1. Lie on your back with your elbows resting on the floor and the wand in both your hands. Your hands will be palm down, or pointing toward your feet. 2. Lift your hands toward the ceiling, using your unaffected arm to help if needed. 3. Bring your arms overhead as able, using your unaffected arm to help if needed. Internal rotation 1. Stand while holding the wand behind you with both hands. Your unaffected arm should be extended above your head with the arm of the affected side extended behind you at the  level of your waist. The wand should be pointing straight up and down as you hold it. 2. Slowly pull the wand up behind your back by straightening the elbow of your unaffected arm and bending the elbow of your affected arm. External rotation 1. Lie on your back with your affected upper arm supported on a small pillow or rolled towel. When  you first do this exercise, keep your upper arm close to your body. Over time, bring your arm up to a 90 angle out to the side. 2. Hold the wand across your stomach and with both hands palm up. Your elbow on your affected side should be bent at a 90 angle. 3. Use your unaffected side to help push your forearm away from you and toward the floor. Keep your elbow on your affected side bent at a 90 angle. Contact a health care provider if you have:  New or increasing pain.  New numbness, tingling, weakness, or discoloration in your arm or hand. This information is not intended to replace advice given to you by your health care provider. Make sure you discuss any questions you have with your health care provider. Document Revised: 08/03/2017 Document Reviewed: 08/03/2017 Elsevier Patient Education  2021 [employer]  Shoulder Exercises Ask your health care provider which exercises are safe for you. Do exercises exactly as told by your health care provider and adjust them as directed. It is normal to feel mild stretching, pulling, tightness, or discomfort as you do these exercises. Stop right away if you feel sudden pain or your pain gets worse. Do not begin these exercises until told by your health care provider. Stretching exercises External rotation and abduction This exercise is sometimes called corner stretch. This exercise rotates your arm outward (external rotation) and moves your arm out from your body (abduction). 6. Stand in a doorway with one of your feet slightly in front of the other. This is called a staggered stance. If you cannot reach your  forearms to the door frame, stand facing a corner of a room. 7. Choose one of the following positions as told by your health care provider: ? Place your hands and forearms on the door frame above your head. ? Place your hands and forearms on the door frame at the height of your head. ? Place your hands on the door frame at the height of your elbows. 8. Slowly move your weight onto your front foot until you feel a stretch across your chest and in the front of your shoulders. Keep your head and chest upright and keep your abdominal muscles tight. 9. Hold for __________ seconds. 10. To release the stretch, shift your weight to your back foot. Repeat __________ times. Complete this exercise __________ times a day.   Extension, standing 8. Stand and hold a broomstick, a cane, or a similar object behind your back. ? Your hands should be a little wider than shoulder width apart. ? Your palms should face away from your back. 9. Keeping your elbows straight and your shoulder muscles relaxed, move the stick away from your body until you feel a stretch in your shoulders (extension). ? Avoid shrugging your shoulders while you move the stick. Keep your shoulder blades tucked down toward the middle of your back. 10. Hold for __________ seconds. 11. Slowly return to the starting position. Repeat __________ times. Complete this exercise __________ times a day. Range-of-motion exercises Pendulum 4. Stand near a wall or a surface that you can hold onto for balance. 5. Bend at the waist and let your left / right arm hang straight down. Use your other arm to support you. Keep your back straight and do not lock your knees. 6. Relax your left / right arm and shoulder muscles, and move your hips and your trunk so your left / right arm swings freely. Your arm should swing because of  the motion of your body, not because you are using your arm or shoulder muscles. 7. Keep moving your hips and trunk so your arm swings  in the following directions, as told by your health care provider: ? Side to side. ? Forward and backward. ? In clockwise and counterclockwise circles. 8. Continue each motion for __________ seconds, or for as long as told by your health care provider. 9. Slowly return to the starting position. Repeat __________ times. Complete this exercise __________ times a day.   Shoulder flexion, standing 5. Stand and hold a broomstick, a cane, or a similar object. Place your hands a little more than shoulder width apart on the object. Your left / right hand should be palm up, and your other hand should be palm down. 6. Keep your elbow straight and your shoulder muscles relaxed. Push the stick up with your healthy arm to raise your left / right arm in front of your body, and then over your head until you feel a stretch in your shoulder (flexion). ? Avoid shrugging your shoulder while you raise your arm. Keep your shoulder blade tucked down toward the middle of your back. 7. Hold for __________ seconds. 8. Slowly return to the starting position. Repeat __________ times. Complete this exercise __________ times a day.   Shoulder abduction, standing 4. Stand and hold a broomstick, a cane, or a similar object. Place your hands a little more than shoulder width apart on the object. Your left / right hand should be palm up, and your other hand should be palm down. 5. Keep your elbow straight and your shoulder muscles relaxed. Push the object across your body toward your left / right side. Raise your left / right arm to the side of your body (abduction) until you feel a stretch in your shoulder. ? Do not raise your arm above shoulder height unless your health care provider tells you to do that. ? If directed, raise your arm over your head. ? Avoid shrugging your shoulder while you raise your arm. Keep your shoulder blade tucked down toward the middle of your back. 6. Hold for __________ seconds. 7. Slowly return to  the starting position. Repeat __________ times. Complete this exercise __________ times a day. Internal rotation 4. Place your left / right hand behind your back, palm up. 5. Use your other hand to dangle an exercise band, a towel, or a similar object over your shoulder. Grasp the band with your left / right hand so you are holding on to both ends. 6. Gently pull up on the band until you feel a stretch in the front of your left / right shoulder. The movement of your arm toward the center of your body is called internal rotation. ? Avoid shrugging your shoulder while you raise your arm. Keep your shoulder blade tucked down toward the middle of your back. 7. Hold for __________ seconds. 8. Release the stretch by letting go of the band and lowering your hands. Repeat __________ times. Complete this exercise __________ times a day.   Strengthening exercises External rotation 4. Sit in a stable chair without armrests. 5. Secure an exercise band to a stable object at elbow height on your left / right side. 6. Place a soft object, such as a folded towel or a small pillow, between your left / right upper arm and your body to move your elbow about 4 inches (10 cm) away from your side. 7. Hold the end of the exercise band so  it is tight and there is no slack. 8. Keeping your elbow pressed against the soft object, slowly move your forearm out, away from your abdomen (external rotation). Keep your body steady so only your forearm moves. 9. Hold for __________ seconds. 10. Slowly return to the starting position. Repeat __________ times. Complete this exercise __________ times a day.   Shoulder abduction 4. Sit in a stable chair without armrests, or stand up. 5. Hold a __________ weight in your left / right hand, or hold an exercise band with both hands. 6. Start with your arms straight down and your left / right palm facing in, toward your body. 7. Slowly lift your left / right hand out to your side  (abduction). Do not lift your hand above shoulder height unless your health care provider tells you that this is safe. ? Keep your arms straight. ? Avoid shrugging your shoulder while you do this movement. Keep your shoulder blade tucked down toward the middle of your back. 8. Hold for __________ seconds. 9. Slowly lower your arm, and return to the starting position. Repeat __________ times. Complete this exercise __________ times a day.   Shoulder extension 4. Sit in a stable chair without armrests, or stand up. 5. Secure an exercise band to a stable object in front of you so it is at shoulder height. 6. Hold one end of the exercise band in each hand. Your palms should face each other. 7. Straighten your elbows and lift your hands up to shoulder height. 8. Step back, away from the secured end of the exercise band, until the band is tight and there is no slack. 9. Squeeze your shoulder blades together as you pull your hands down to the sides of your thighs (extension). Stop when your hands are straight down by your sides. Do not let your hands go behind your body. 10. Hold for __________ seconds. 11. Slowly return to the starting position. Repeat __________ times. Complete this exercise __________ times a day. Shoulder row 3. Sit in a stable chair without armrests, or stand up. 4. Secure an exercise band to a stable object in front of you so it is at waist height. 5. Hold one end of the exercise band in each hand. Position your palms so that your thumbs are facing the ceiling (neutral position). 6. Bend each of your elbows to a 90-degree angle (right angle) and keep your upper arms at your sides. 7. Step back until the band is tight and there is no slack. 8. Slowly pull your elbows back behind you. 9. Hold for __________ seconds. 10. Slowly return to the starting position. Repeat __________ times. Complete this exercise __________ times a day. Shoulder press-ups 4. Sit in a stable chair  that has armrests. Sit upright, with your feet flat on the floor. 5. Put your hands on the armrests so your elbows are bent and your fingers are pointing forward. Your hands should be about even with the sides of your body. 6. Push down on the armrests and use your arms to lift yourself off the chair. Straighten your elbows and lift yourself up as much as you comfortably can. ? Move your shoulder blades down, and avoid letting your shoulders move up toward your ears. ? Keep your feet on the ground. As you get stronger, your feet should support less of your body weight as you lift yourself up. 7. Hold for __________ seconds. 8. Slowly lower yourself back into the chair. Repeat __________ times. Complete this  exercise __________ times a day.   Wall push-ups 1. Stand so you are facing a stable wall. Your feet should be about one arm-length away from the wall. 2. Lean forward and place your palms on the wall at shoulder height. 3. Keep your feet flat on the floor as you bend your elbows and lean forward toward the wall. 4. Hold for __________ seconds. 5. Straighten your elbows to push yourself back to the starting position. Repeat __________ times. Complete this exercise __________ times a day.   This information is not intended to replace advice given to you by your health care provider. Make sure you discuss any questions you have with your health care provider. Document Revised: 10/13/2018 Document Reviewed: 07/21/2018 Elsevier Patient Education  2021 [employer]   Neuropathic Pain Neuropathic pain is pain caused by damage to the nerves that are responsible for certain sensations in your body (sensory nerves). The pain can be caused by:  Damage to the sensory nerves that send signals to your spinal cord and brain (peripheral nervous system).  Damage to the sensory nerves in your brain or spinal cord (central nervous system). Neuropathic pain can make you more sensitive to pain. Even a  minor sensation can feel very painful. This is usually a long-term condition that can be difficult to treat. The type of pain differs from person to person. It may:  Start suddenly (acute), or it may develop slowly and last for a long time (chronic).  Come and go as damaged nerves heal, or it may stay at the same level for years.  Cause emotional distress, loss of sleep, and a lower quality of life. What are the causes? The most common cause of this condition is diabetes. Many other diseases and conditions can also cause neuropathic pain. Causes of neuropathic pain can be classified as:  Toxic. This is caused by medicines and chemicals. The most common cause of toxic neuropathic pain is damage from cancer treatments (chemotherapy).  Metabolic. This can be caused by: ? Diabetes. This is the most common disease that damages the nerves. ? Lack of vitamin B from long-term alcohol abuse.  Traumatic. Any injury that cuts, crushes, or stretches a nerve can cause damage and pain. A common example is feeling pain after losing an arm or leg (phantom limb pain).  Compression-related. If a sensory nerve gets trapped or compressed for a long period of time, the blood supply to the nerve can be cut off.  Vascular. Many blood vessel diseases can cause neuropathic pain by decreasing blood supply and oxygen to nerves.  Autoimmune. This type of pain results from diseases in which the body's defense system (immune system) mistakenly attacks sensory nerves. Examples of autoimmune diseases that can cause neuropathic pain include lupus and multiple sclerosis.  Infectious. Many types of viral infections can damage sensory nerves and cause pain. Shingles infection is a common cause of this type of pain.  Inherited. Neuropathic pain can be a symptom of many diseases that are passed down through families (genetic). What increases the risk? You are more likely to develop this condition if:  You have  diabetes.  You smoke.  You drink too much alcohol.  You are taking certain medicines, including medicines that kill cancer cells (chemotherapy) or that treat immune system disorders. What are the signs or symptoms? The main symptom is pain. Neuropathic pain is often described as:  Burning.  Shock-like.  Stinging.  Hot or cold.  Itching. How is this diagnosed? No single  test can diagnose neuropathic pain. It is diagnosed based on:  Physical exam and your symptoms. Your health care provider will ask you about your pain. You may be asked to use a pain scale to describe how bad your pain is.  Tests. These may be done to see if you have a high sensitivity to pain and to help find the cause and location of any sensory nerve damage. They include: ? Nerve conduction studies to test how well nerve signals travel through your sensory nerves (electrodiagnostic testing). ? Stimulating your sensory nerves through electrodes on your skin and measuring the response in your spinal cord and brain (somatosensory evoked potential).  Imaging studies, such as: ? X-rays. ? CT scan. ? MRI. How is this treated? Treatment for neuropathic pain may change over time. You may need to try different treatment options or a combination of treatments. Some options include:  Treating the underlying cause of the neuropathy, such as diabetes, kidney disease, or vitamin deficiencies.  Stopping medicines that can cause neuropathy, such as chemotherapy.  Medicine to relieve pain. Medicines may include: ? Prescription or over-the-counter pain medicine. ? Anti-seizure medicine. ? Antidepressant medicines. ? Pain-relieving patches that are applied to painful areas of skin. ? A medicine to numb the area (local anesthetic), which can be injected as a nerve block.  Transcutaneous nerve stimulation. This uses electrical currents to block painful nerve signals. The treatment is painless.  Alternative treatments,  such as: ? Acupuncture. ? Meditation. ? Massage. ? Physical therapy. ? Pain management programs. ? Counseling. Follow these instructions at home: Medicines  Take over-the-counter and prescription medicines only as told by your health care provider.  Do not drive or use heavy machinery while taking prescription pain medicine.  If you are taking prescription pain medicine, take actions to prevent or treat constipation. Your health care provider may recommend that you: ? Drink enough fluid to keep your urine pale yellow. ? Eat foods that are high in fiber, such as fresh fruits and vegetables, whole grains, and beans. ? Limit foods that are high in fat and processed sugars, such as fried or sweet foods. ? Take an over-the-counter or prescription medicine for constipation.   Lifestyle  Have a good support system at home.  Consider joining a chronic pain support group.  Do not use any products that contain nicotine or tobacco, such as cigarettes and e-cigarettes. If you need help quitting, ask your health care provider.  Do not drink alcohol.   General instructions  Learn as much as you can about your condition.  Work closely with all your health care providers to find the treatment plan that works best for you.  Ask your health care provider what activities are safe for you.  Keep all follow-up visits as told by your health care provider. This is important. Contact a health care provider if:  Your pain treatments are not working.  You are having side effects from your medicines.  You are struggling with tiredness (fatigue), mood changes, depression, or anxiety. Summary  Neuropathic pain is pain caused by damage to the nerves that are responsible for certain sensations in your body (sensory nerves).  Neuropathic pain may come and go as damaged nerves heal, or it may stay at the same level for years.  Neuropathic pain is usually a long-term condition that can be difficult  to treat. Consider joining a chronic pain support group. This information is not intended to replace advice given to you by your health care  provider. Make sure you discuss any questions you have with your health care provider. Document Revised: 10/12/2018 Document Reviewed: 07/08/2017 Elsevier Patient Education  2021 [employer]  Peripheral Neuropathy Peripheral neuropathy is a type of nerve damage. It affects nerves that carry signals between the spinal cord and the arms, legs, and the rest of the body (peripheral nerves). It does not affect nerves in the spinal cord or brain. In peripheral neuropathy, one nerve or a group of nerves may be damaged. Peripheral neuropathy is a broad category that includes many specific nerve disorders, like diabetic neuropathy, hereditary neuropathy, and carpal tunnel syndrome. What are the causes? This condition may be caused by:  Diabetes. This is the most common cause of peripheral neuropathy.  Nerve injury.  Pressure or stress on a nerve that lasts a long time.  Lack (deficiency) of B vitamins. This can result from alcoholism, poor diet, or a restricted diet.  Infections.  Autoimmune diseases, such as rheumatoid arthritis and systemic lupus erythematosus.  Nerve diseases that are passed from parent to child (inherited).  Some medicines, such as cancer medicines (chemotherapy).  Poisonous (toxic) substances, such as lead and mercury.  Too little blood flowing to the legs.  Kidney disease.  Thyroid disease. In some cases, the cause of this condition is not known. What are the signs or symptoms? Symptoms of this condition depend on which of your nerves is damaged. Common symptoms include:  Loss of feeling (numbness) in the feet, hands, or both.  Tingling in the feet, hands, or both.  Burning pain.  Very sensitive skin.  Weakness.  Not being able to move a part of the body (paralysis).  Muscle twitching.  Clumsiness or poor  coordination.  Loss of balance.  Not being able to control your bladder.  Feeling dizzy.  Sexual problems. How is this diagnosed? Diagnosing and finding the cause of peripheral neuropathy can be difficult. Your health care provider will take your medical history and do a physical exam. A neurological exam will also be done. This involves checking things that are affected by your brain, spinal cord, and nerves (nervous system). For example, your health care provider will check your reflexes, how you move, and what you can feel. You may have other tests, such as:  Blood tests.  Electromyogram (EMG) and nerve conduction tests. These tests check nerve function and how well the nerves are controlling the muscles.  Imaging tests, such as CT scans or MRI to rule out other causes of your symptoms.  Removing a small piece of nerve to be examined in a lab (nerve biopsy).  Removing and examining a small amount of the fluid that surrounds the brain and spinal cord (lumbar puncture). How is this treated? Treatment for this condition may involve:  Treating the underlying cause of the neuropathy, such as diabetes, kidney disease, or vitamin deficiencies.  Stopping medicines that can cause neuropathy, such as chemotherapy.  Medicine to help relieve pain. Medicines may include: ? Prescription or over-the-counter pain medicine. ? Antiseizure medicine. ? Antidepressants. ? Pain-relieving patches that are applied to painful areas of skin.  Surgery to relieve pressure on a nerve or to destroy a nerve that is causing pain.  Physical therapy to help improve movement and balance.  Devices to help you move around (assistive devices). Follow these instructions at home: Medicines  Take over-the-counter and prescription medicines only as told by your health care provider. Do not take any other medicines without first asking your health care provider.  Do not drive or use heavy machinery while taking  prescription pain medicine. Lifestyle  Do not use any products that contain nicotine or tobacco, such as cigarettes and e-cigarettes. Smoking keeps blood from reaching damaged nerves. If you need help quitting, ask your health care provider.  Avoid or limit alcohol. Too much alcohol can cause a vitamin B deficiency, and vitamin B is needed for healthy nerves.  Eat a healthy diet. This includes: ? Eating foods that are high in fiber, such as fresh fruits and vegetables, whole grains, and beans. ? Limiting foods that are high in fat and processed sugars, such as fried or sweet foods.   General instructions  If you have diabetes, work closely with your health care provider to keep your blood sugar under control.  If you have numbness in your feet: ? Check every day for signs of injury or infection. Watch for redness, warmth, and swelling. ? Wear padded socks and comfortable shoes. These help protect your feet.  Develop a good support system. Living with peripheral neuropathy can be stressful. Consider talking with a mental health specialist or joining a support group.  Use assistive devices and attend physical therapy as told by your health care provider. This may include using a walker or a cane.  Keep all follow-up visits as told by your health care provider. This is important.   Contact a health care provider if:  You have new signs or symptoms of peripheral neuropathy.  You are struggling emotionally from dealing with peripheral neuropathy.  Your pain is not well-controlled. Get help right away if:  You have an injury or infection that is not healing normally.  You develop new weakness in an arm or leg.  You have fallen or do so frequently. Summary  Peripheral neuropathy is when the nerves in the arms, or legs are damaged, resulting in numbness, weakness, or pain.  There are many causes of peripheral neuropathy, including diabetes, pinched nerves, vitamin deficiencies,  autoimmune disease, and hereditary conditions.  Diagnosing and finding the cause of peripheral neuropathy can be difficult. Your health care provider will take your medical history, do a physical exam, and do tests, including blood tests and nerve function tests.  Treatment involves treating the underlying cause of the neuropathy and taking medicines to help control pain. Physical therapy and assistive devices may also help. This information is not intended to replace advice given to you by your health care provider. Make sure you discuss any questions you have with your health care provider. Document Revised: 04/01/2020 Document Reviewed: 04/01/2020 Elsevier Patient Education  2021 [employer]   Back Exercises The following exercises strengthen the muscles that help to support the trunk and back. They also help to keep the lower back flexible. Doing these exercises can help to prevent back pain or lessen existing pain.  If you have back pain or discomfort, try doing these exercises 2-3 times each day or as told by your health care provider.  As your pain improves, do them once each day, but increase the number of times that you repeat the steps for each exercise (do more repetitions).  To prevent the recurrence of back pain, continue to do these exercises once each day or as told by your health care provider. Do exercises exactly as told by your health care provider and adjust them as directed. It is normal to feel mild stretching, pulling, tightness, or discomfort as you do these exercises, but you should stop right away if you  feel sudden pain or your pain gets worse. Exercises Single knee to chest Repeat these steps 3-5 times for each leg: 12. Lie on your back on a firm bed or the floor with your legs extended. 13. Bring one knee to your chest. Your other leg should stay extended and in contact with the floor. 14. Hold your knee in place by grabbing your knee or thigh with both hands  and hold. 15. Pull on your knee until you feel a gentle stretch in your lower back or buttocks. 16. Hold the stretch for 10-30 seconds. 17. Slowly release and straighten your leg. Pelvic tilt Repeat these steps 5-10 times: 10. Lie on your back on a firm bed or the floor with your legs extended. 11. Bend your knees so they are pointing toward the ceiling and your feet are flat on the floor. 12. Tighten your lower abdominal muscles to press your lower back against the floor. This motion will tilt your pelvis so your tailbone points up toward the ceiling instead of pointing to your feet or the floor. 13. With gentle tension and even breathing, hold this position for 5-10 seconds. Cat-cow Repeat these steps until your lower back becomes more flexible: 9. Get into a hands-and-knees position on a firm surface. Keep your hands under your shoulders, and keep your knees under your hips. You may place padding under your knees for comfort. 10. Let your head hang down toward your chest. Contract your abdominal muscles and point your tailbone toward the floor so your lower back becomes rounded like the back of a cat. 11. Hold this position for 5 seconds. 12. Slowly lift your head, let your abdominal muscles relax and point your tailbone up toward the ceiling so your back forms a sagging arch like the back of a cow. 13. Hold this position for 5 seconds.   Press-ups Repeat these steps 5-10 times: 8. Lie on your abdomen (face-down) on the floor. 9. Place your palms near your head, about shoulder-width apart. 10. Keeping your back as relaxed as possible and keeping your hips on the floor, slowly straighten your arms to raise the top half of your body and lift your shoulders. Do not use your back muscles to raise your upper torso. You may adjust the placement of your hands to make yourself more comfortable. 11. Hold this position for 5 seconds while you keep your back relaxed. 12. Slowly return to lying flat on  the floor.   Bridges Repeat these steps 10 times: 9. Lie on your back on a firm surface. 10. Bend your knees so they are pointing toward the ceiling and your feet are flat on the floor. Your arms should be flat at your sides, next to your body. 11. Tighten your buttocks muscles and lift your buttocks off the floor until your waist is at almost the same height as your knees. You should feel the muscles working in your buttocks and the back of your thighs. If you do not feel these muscles, slide your feet 1-2 inches farther away from your buttocks. 12. Hold this position for 3-5 seconds. 13. Slowly lower your hips to the starting position, and allow your buttocks muscles to relax completely. If this exercise is too easy, try doing it with your arms crossed over your chest.   Abdominal crunches Repeat these steps 5-10 times: 11. Lie on your back on a firm bed or the floor with your legs extended. 12. Bend your knees so they are pointing toward  the ceiling and your feet are flat on the floor. 13. Cross your arms over your chest. 14. Tip your chin slightly toward your chest without bending your neck. 15. Tighten your abdominal muscles and slowly raise your trunk (torso) high enough to lift your shoulder blades a tiny bit off the floor. Avoid raising your torso higher than that because it can put too much stress on your low back and does not help to strengthen your abdominal muscles. 16. Slowly return to your starting position. Back lifts Repeat these steps 5-10 times: 10. Lie on your abdomen (face-down) with your arms at your sides, and rest your forehead on the floor. 11. Tighten the muscles in your legs and your buttocks. 12. Slowly lift your chest off the floor while you keep your hips pressed to the floor. Keep the back of your head in line with the curve in your back. Your eyes should be looking at the floor. 13. Hold this position for 3-5 seconds. 14. Slowly return to your starting  position. Contact a health care provider if:  Your back pain or discomfort gets much worse when you do an exercise.  Your worsening back pain or discomfort does not lessen within 2 hours after you exercise. If you have any of these problems, stop doing these exercises right away. Do not do them again unless your health care provider says that you can. Get help right away if:  You develop sudden, severe back pain. If this happens, stop doing the exercises right away. Do not do them again unless your health care provider says that you can. This information is not intended to replace advice given to you by your health care provider. Make sure you discuss any questions you have with your health care provider. Document Revised: 10/26/2018 Document Reviewed: 03/23/2018 Elsevier Patient Education  2021 [employer].

## 2020-11-11 DIAGNOSIS — M25512 Pain in left shoulder: Secondary | ICD-10-CM | POA: Diagnosis not present

## 2020-11-11 DIAGNOSIS — M7542 Impingement syndrome of left shoulder: Secondary | ICD-10-CM | POA: Diagnosis not present

## 2020-11-11 DIAGNOSIS — M48062 Spinal stenosis, lumbar region with neurogenic claudication: Secondary | ICD-10-CM | POA: Diagnosis not present

## 2020-11-12 ENCOUNTER — Telehealth: Payer: Self-pay

## 2020-11-12 NOTE — Telephone Encounter (Signed)
Faxed signed eval notes back to [facility] at [phone] on 11/12/20

## 2020-11-13 DIAGNOSIS — M48062 Spinal stenosis, lumbar region with neurogenic claudication: Secondary | ICD-10-CM | POA: Diagnosis not present

## 2020-11-13 DIAGNOSIS — M25512 Pain in left shoulder: Secondary | ICD-10-CM | POA: Diagnosis not present

## 2020-11-13 DIAGNOSIS — M7542 Impingement syndrome of left shoulder: Secondary | ICD-10-CM | POA: Diagnosis not present

## 2020-11-18 DIAGNOSIS — M7542 Impingement syndrome of left shoulder: Secondary | ICD-10-CM | POA: Diagnosis not present

## 2020-11-18 DIAGNOSIS — M48062 Spinal stenosis, lumbar region with neurogenic claudication: Secondary | ICD-10-CM | POA: Diagnosis not present

## 2020-11-18 DIAGNOSIS — M25512 Pain in left shoulder: Secondary | ICD-10-CM | POA: Diagnosis not present

## 2020-11-25 DIAGNOSIS — M7542 Impingement syndrome of left shoulder: Secondary | ICD-10-CM | POA: Diagnosis not present

## 2020-11-25 DIAGNOSIS — M48062 Spinal stenosis, lumbar region with neurogenic claudication: Secondary | ICD-10-CM | POA: Diagnosis not present

## 2020-11-25 DIAGNOSIS — M25512 Pain in left shoulder: Secondary | ICD-10-CM | POA: Diagnosis not present

## 2020-11-27 DIAGNOSIS — M7542 Impingement syndrome of left shoulder: Secondary | ICD-10-CM | POA: Diagnosis not present

## 2020-11-27 DIAGNOSIS — M48062 Spinal stenosis, lumbar region with neurogenic claudication: Secondary | ICD-10-CM | POA: Diagnosis not present

## 2020-11-27 DIAGNOSIS — M25512 Pain in left shoulder: Secondary | ICD-10-CM | POA: Diagnosis not present

## 2020-12-02 DIAGNOSIS — M7542 Impingement syndrome of left shoulder: Secondary | ICD-10-CM | POA: Diagnosis not present

## 2020-12-02 DIAGNOSIS — M25512 Pain in left shoulder: Secondary | ICD-10-CM | POA: Diagnosis not present

## 2020-12-02 DIAGNOSIS — M48062 Spinal stenosis, lumbar region with neurogenic claudication: Secondary | ICD-10-CM | POA: Diagnosis not present

## 2020-12-04 DIAGNOSIS — M25512 Pain in left shoulder: Secondary | ICD-10-CM | POA: Diagnosis not present

## 2020-12-04 DIAGNOSIS — M48062 Spinal stenosis, lumbar region with neurogenic claudication: Secondary | ICD-10-CM | POA: Diagnosis not present

## 2020-12-04 DIAGNOSIS — M7542 Impingement syndrome of left shoulder: Secondary | ICD-10-CM | POA: Diagnosis not present

## 2020-12-09 DIAGNOSIS — M48062 Spinal stenosis, lumbar region with neurogenic claudication: Secondary | ICD-10-CM | POA: Diagnosis not present

## 2020-12-09 DIAGNOSIS — M25512 Pain in left shoulder: Secondary | ICD-10-CM | POA: Diagnosis not present

## 2020-12-09 DIAGNOSIS — M7542 Impingement syndrome of left shoulder: Secondary | ICD-10-CM | POA: Diagnosis not present

## 2020-12-11 DIAGNOSIS — M7542 Impingement syndrome of left shoulder: Secondary | ICD-10-CM | POA: Diagnosis not present

## 2020-12-11 DIAGNOSIS — M48062 Spinal stenosis, lumbar region with neurogenic claudication: Secondary | ICD-10-CM | POA: Diagnosis not present

## 2020-12-11 DIAGNOSIS — M25512 Pain in left shoulder: Secondary | ICD-10-CM | POA: Diagnosis not present

## 2020-12-16 DIAGNOSIS — M48062 Spinal stenosis, lumbar region with neurogenic claudication: Secondary | ICD-10-CM | POA: Diagnosis not present

## 2020-12-16 DIAGNOSIS — M25512 Pain in left shoulder: Secondary | ICD-10-CM | POA: Diagnosis not present

## 2020-12-16 DIAGNOSIS — M7542 Impingement syndrome of left shoulder: Secondary | ICD-10-CM | POA: Diagnosis not present

## 2020-12-18 ENCOUNTER — Telehealth: Payer: Self-pay

## 2020-12-18 DIAGNOSIS — M7542 Impingement syndrome of left shoulder: Secondary | ICD-10-CM | POA: Diagnosis not present

## 2020-12-18 DIAGNOSIS — M48062 Spinal stenosis, lumbar region with neurogenic claudication: Secondary | ICD-10-CM | POA: Diagnosis not present

## 2020-12-18 DIAGNOSIS — M25512 Pain in left shoulder: Secondary | ICD-10-CM | POA: Diagnosis not present

## 2020-12-18 NOTE — Telephone Encounter (Signed)
Okay for referral? If so I can place order in last office visit

## 2020-12-18 NOTE — Telephone Encounter (Signed)
Pt wants referral to Chilton Si, MD in [facility]. He states that he needs to be called on Mondays or Thursday morning. Leave detailed vm if unable to answer.

## 2020-12-19 ENCOUNTER — Encounter: Payer: Self-pay | Admitting: Internal Medicine

## 2020-12-19 NOTE — Addendum Note (Signed)
Addended by: Quentin Ore on: 12/19/2020 01:21 AM   Modules accepted: Orders

## 2020-12-23 DIAGNOSIS — M48062 Spinal stenosis, lumbar region with neurogenic claudication: Secondary | ICD-10-CM | POA: Diagnosis not present

## 2020-12-23 DIAGNOSIS — M7542 Impingement syndrome of left shoulder: Secondary | ICD-10-CM | POA: Diagnosis not present

## 2020-12-23 DIAGNOSIS — M25512 Pain in left shoulder: Secondary | ICD-10-CM | POA: Diagnosis not present

## 2020-12-25 DIAGNOSIS — M7542 Impingement syndrome of left shoulder: Secondary | ICD-10-CM | POA: Diagnosis not present

## 2020-12-25 DIAGNOSIS — M48062 Spinal stenosis, lumbar region with neurogenic claudication: Secondary | ICD-10-CM | POA: Diagnosis not present

## 2020-12-25 DIAGNOSIS — M25512 Pain in left shoulder: Secondary | ICD-10-CM | POA: Diagnosis not present

## 2020-12-29 ENCOUNTER — Other Ambulatory Visit: Payer: Self-pay

## 2020-12-29 ENCOUNTER — Ambulatory Visit: Payer: PPO | Admitting: Physician Assistant

## 2020-12-29 VITALS — BP 126/74 | HR 64 | Ht >= 80 in | Wt 249.4 lb

## 2020-12-29 DIAGNOSIS — I10 Essential (primary) hypertension: Secondary | ICD-10-CM

## 2020-12-29 DIAGNOSIS — I2510 Atherosclerotic heart disease of native coronary artery without angina pectoris: Secondary | ICD-10-CM

## 2020-12-29 DIAGNOSIS — R072 Precordial pain: Secondary | ICD-10-CM

## 2020-12-29 DIAGNOSIS — E785 Hyperlipidemia, unspecified: Secondary | ICD-10-CM

## 2020-12-29 NOTE — Patient Instructions (Addendum)
Medication Instructions:  Your physician recommends that you continue on your current medications as directed. Please refer to the Current Medication list given to you today.  *If you need a refill on your cardiac medications before your next appointment, please call your pharmacy*  Lab Work: NONE ordered at this time of appointment   If you have labs (blood work) drawn today and your tests are completely normal, you will receive your results only by: MyChart Message (if you have MyChart) OR A paper copy in the mail If you have any lab test that is abnormal or we need to change your treatment, we will call you to review the results.  Testing/Procedures: NONE ordered at this time of appointment   Follow-Up: At [facility], you and your health needs are our priority.  As part of our continuing mission to provide you with exceptional heart care, we have created designated Provider Care Teams.  These Care Teams include your primary Cardiologist (physician) and Advanced Practice Providers (APPs -  Physician Assistants and Nurse Practitioners) who all work together to provide you with the care you need, when you need it.  We recommend signing up for the patient portal called "MyChart".  Sign up information is provided on this After Visit Summary.  MyChart is used to connect with patients for Virtual Visits (Telemedicine).  Patients are able to view lab/test results, encounter notes, upcoming appointments, etc.  Non-urgent messages can be sent to your provider as well.   To learn more about what you can do with MyChart, go to [email or web address].    Your next appointment:   12 month(s)  The format for your next appointment:   In Person  Provider:   Chilton Si, MD  Other Instructions

## 2020-12-29 NOTE — Progress Notes (Signed)
Cardiology Office Note:    Date:  12/31/2020   ID:  John Keith, DOB 1950/09/26, MRN [id number]  PCP:  [name], Pasty Spillers, MD   [facility] Providers Cardiologist:  Chilton Si, MD     Referring MD: [name], French Ana *   Chief Complaint  Patient presents with   Follow-up    Seen for Dr. Duke Salvia    History of Present Illness:    John Keith is a 70 y.o. male with a hx of HTN, HLD, tobacco abuse, COVID-19 infection in early 2021, history of gastric ulcer/GI bleed and aortic atherosclerosis.  Patient was previously seen by Dr. Duke Salvia in April 2021 for hypertension and elevated troponin.  Patient was seen in the [facility] ED multiple times for blood pressure in the 160s.  However it was felt that his wrist blood pressure cuff was not accurate.  In the emergency room, his blood pressure was actually in the 110s.  Troponin in the ED was elevated at 0.042.  He also had a gastric pain, however this was in the setting of gastric ulcer.  He felt better after being placed on Protonix by his PCP.  Given his significant family history of CAD, tobacco abuse and hypertension, Dr. Duke Salvia ordered a coronary CT to help guide the treatment.  Coronary CT performed on 10/25/2019 showed a coronary calcium score of 23.1 which placed the patient in the 48th percentile for age and sex matched control, mild calcification in the proximal RCA, proximal and mid LAD, all of which are less than 25%, medical management was recommended.   Patient presents today for a yearly follow-up.  Blood pressure is very well controlled on the current therapy.  Last lab work obtained in January 2022 showed a stable renal function and electrolyte.  He is on Zetia 10 mg daily.  Lipid panel obtained in June of last year showed a borderline LDL from 99.  Otherwise very well controlled total cholesterol, triglyceride and HDL.  I recommended diet and exercise.  He still has intermittent chest discomfort,  however his chest discomfort only occurs when he wake up in the morning.  He has a habit of eating right before going to the bed.  I question this is more acid reflux rather than cardiac.  Chest pain does not occur with physical exertion.  Given the mild coronary artery disease that was seen on the coronary CT in April of last year, I recommended continued observation and let us know if his chest pain become more associated with physical activity.  Otherwise the patient can follow-up in 1 year with Dr. Duke Salvia.  Past Medical History:  Diagnosis Date   Allergy    Arthritis    Carpal tunnel syndrome on left    Chest pain of uncertain etiology 10/24/2019   COVID-19    07/07/19   CTS (carpal tunnel syndrome)    left   DDD (degenerative disc disease), thoracolumbar    unknown location    DDD (degenerative disc disease), thoracolumbar    multilevel   Degenerative joint disease of left shoulder    Degenerative joint disease of left shoulder    02/2011    Diverticulosis    left colon (2008)    ED (erectile dysfunction)    02/2011    ED (erectile dysfunction)    GERD (gastroesophageal reflux disease)    H. pylori infection    H. pylori infection    Hyperlipidemia    Hypertension    Insomnia  Lactose intolerance    Lactose intolerance    Low back pain    PVC (premature ventricular contraction) 10/24/2019   Shingles    07/2018   Smoking    smoking since age 15 y.o   Toe fracture, left    4th in 2014   Vitamin D deficiency    Vitamin D deficiency     Past Surgical History:  Procedure Laterality Date   APPENDECTOMY     BACK SURGERY     x 2 10991 and 1995 in [place]    CARDIAC CATHETERIZATION     1999 nl   COLONOSCOPY     2008 diverticulosis   ESOPHAGOGASTRODUODENOSCOPY     h/o +h. pylori   OTHER SURGICAL HISTORY     heart catherization 1999 normal    SPINE SURGERY     x 2     Current Medications: Current Meds  Medication Sig   amLODipine (NORVASC) 5 MG tablet Take 1  tablet (5 mg total) by mouth daily. Goal BP less than 130 (top)/less than 80 (bottom)   Cholecalciferol (VITAMIN D-3) 125 MCG (5000 UT) TABS Take by mouth.   ezetimibe (ZETIA) 10 MG tablet Take 1 tablet (10 mg total) by mouth daily.   ferrous sulfate 325 (65 FE) MG tablet Take 325 mg by mouth daily with breakfast.   gabapentin (NEURONTIN) 300 MG capsule Take 1-2 capsules (300-600 mg total) by mouth at bedtime. 300 mg at night x 2 weeks can increase to 2 pills at night week 3 and beyond at night   losartan-hydrochlorothiazide (HYZAAR) 100-12.5 MG tablet Take 1 tablet by mouth daily. In am   meloxicam (MOBIC) 15 MG tablet meloxicam 15 mg tablet   Multiple Vitamin (MULTIVITAMIN) tablet Take 1 tablet by mouth daily.   pantoprazole (PROTONIX) 40 MG tablet Take 1 tablet (40 mg total) by mouth daily. 30 min before food   triamcinolone cream (KENALOG) 0.1 % Apply 1 application topically 2 (two) times daily as needed. Back itching     Allergies:   Crestor [rosuvastatin calcium], Lipitor [atorvastatin calcium], and Tomato   Social History   Socioeconomic History   Marital status: Married    Spouse name: Not on file   Number of children: Not on file   Years of education: Not on file   Highest education level: Not on file  Occupational History   Not on file  Tobacco Use   Smoking status: Former    Packs/day: 0.50    Pack years: 0.00    Types: Cigarettes    Start date: 07/06/1971   Smokeless tobacco: Never   Tobacco comments:    06/26/20 quit date  Substance and Sexual Activity   Alcohol use: No    Alcohol/week: 0.0 standard drinks   Drug use: No   Sexual activity: Not on file  Other Topics Concern   Not on file  Social History Narrative   Married    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   Owns guns    Wears seat belt, safe in relationship    Smoker    Retired 06/27/2019   Social Determinants of Corporate investment banker Strain: Low Risk    Difficulty of Paying Living Expenses: Not hard  at all  Food Insecurity: No Food Insecurity   Worried About Programme researcher, broadcasting/film/video in the Last Year: Never true   Barista in the Last Year: Never true  Transportation Needs: No Transportation Needs  Lack of Transportation (Medical): No   Lack of Transportation (Non-Medical): No  Physical Activity: Sufficiently Active   Days of Exercise per Week: 5 days   Minutes of Exercise per Session: 30 min  Stress: No Stress Concern Present   Feeling of Stress : Not at all  Social Connections: Unknown   Frequency of Communication with Friends and Family: More than three times a week   Frequency of Social Gatherings with Friends and Family: More than three times a week   Attends Religious Services: Not on Scientist, clinical (histocompatibility and immunogenetics) or Organizations: Yes   Attends Banker Meetings: Not on file   Marital Status: Married     Family History: The patient's family history includes Cancer in his father and maternal uncle; Diabetes in his maternal aunt; Heart attack in his maternal grandmother; Heart disease in his mother; Throat cancer (age of onset: 76) in his father.  ROS:   Please see the history of present illness.     All other systems reviewed and are negative.  EKGs/Labs/Other Studies Reviewed:    The following studies were reviewed today:  Coronary CT 10/25/2019 IMPRESSION: 1. Coronary calcium score of 23.1. This was 48th percentile for age and sex matched control.   2. Normal coronary origin with right dominance.   3. Mild calcifications in the proximal RCA, proximal and mid LAD causing minimal stenosis.   4. CAD-RADS 1. Minimal non-obstructive CAD (0-24%). Consider non-atherosclerotic causes of chest pain. Consider preventive therapy and risk factor modification.  EKG:  EKG is ordered today.  The ekg ordered today demonstrates NSR with TWI in the lateral leads  Recent Labs: 07/10/2020: ALT 26; BUN 12; Creatinine, Ser 1.10; Hemoglobin 12.9; Platelets 267.0;  Potassium 4.0; Sodium 139  Recent Lipid Panel    Component Value Date/Time   CHOL 167 12/13/2019 0835   TRIG 91.0 12/13/2019 0835   HDL 49.60 12/13/2019 0835   CHOLHDL 3 12/13/2019 0835   VLDL 18.2 12/13/2019 0835   LDLCALC 99 12/13/2019 0835     Risk Assessment/Calculations:           Physical Exam:    VS:  BP 126/74   Pulse 64   Ht 6\' 9"  (2.057 m)   Wt 249 lb 6.4 oz (113.1 kg)   SpO2 96%   BMI 26.73 kg/m     Wt Readings from Last 3 Encounters:  12/29/20 249 lb 6.4 oz (113.1 kg)  10/23/20 249 lb (112.9 kg)  08/11/20 247 lb (112 kg)     GEN:  Well nourished, well developed in no acute distress HEENT: Normal NECK: No JVD; No carotid bruits LYMPHATICS: No lymphadenopathy CARDIAC: RRR, no murmurs, rubs, gallops RESPIRATORY:  Clear to auscultation without rales, wheezing or rhonchi  ABDOMEN: Soft, non-tender, non-distended MUSCULOSKELETAL:  No edema; No deformity  SKIN: Warm and dry NEUROLOGIC:  Alert and oriented x 3 PSYCHIATRIC:  Normal affect   ASSESSMENT:    1. Precordial chest pain   2. Coronary artery disease involving native coronary artery of native heart without angina pectoris   3. Essential hypertension   4. Hyperlipidemia LDL goal <70    PLAN:    In order of problems listed above:  Precordial chest pain: Atypical symptom, chest pain typically occurs in the morning after waking up, however does not occur with physical exertion.  Previous coronary CT obtained in 2021 showed minimal CAD.  We will continue observation.  CAD: Minimal CAD seen on previous coronary CT in  2021.  Hypertension: Blood pressures stable  Hyperlipidemia: On Zetia      Medication Adjustments/Labs and Tests Ordered: Current medicines are reviewed at length with the patient today.  Concerns regarding medicines are outlined above.  Orders Placed This Encounter  Procedures   EKG 12-Lead   No orders of the defined types were placed in this encounter.   Patient  Instructions  Medication Instructions:  Your physician recommends that you continue on your current medications as directed. Please refer to the Current Medication list given to you today.  *If you need a refill on your cardiac medications before your next appointment, please call your pharmacy*  Lab Work: NONE ordered at this time of appointment   If you have labs (blood work) drawn today and your tests are completely normal, you will receive your results only by: MyChart Message (if you have MyChart) OR A paper copy in the mail If you have any lab test that is abnormal or we need to change your treatment, we will call you to review the results.  Testing/Procedures: NONE ordered at this time of appointment   Follow-Up: At [facility], you and your health needs are our priority.  As part of our continuing mission to provide you with exceptional heart care, we have created designated Provider Care Teams.  These Care Teams include your primary Cardiologist (physician) and Advanced Practice Providers (APPs -  Physician Assistants and Nurse Practitioners) who all work together to provide you with the care you need, when you need it.  We recommend signing up for the patient portal called "MyChart".  Sign up information is provided on this After Visit Summary.  MyChart is used to connect with patients for Virtual Visits (Telemedicine).  Patients are able to view lab/test results, encounter notes, upcoming appointments, etc.  Non-urgent messages can be sent to your provider as well.   To learn more about what you can do with MyChart, go to [email or web address].    Your next appointment:   12 month(s)  The format for your next appointment:   In Person  Provider:   Chilton Si, MD  Other Instructions    Signed, Azalee Course, PA  12/31/2020 6:50 PM    [facility]

## 2020-12-31 ENCOUNTER — Encounter: Payer: Self-pay | Admitting: Physician Assistant

## 2021-01-01 DIAGNOSIS — M7542 Impingement syndrome of left shoulder: Secondary | ICD-10-CM | POA: Diagnosis not present

## 2021-01-01 DIAGNOSIS — M25512 Pain in left shoulder: Secondary | ICD-10-CM | POA: Diagnosis not present

## 2021-01-01 DIAGNOSIS — M48062 Spinal stenosis, lumbar region with neurogenic claudication: Secondary | ICD-10-CM | POA: Diagnosis not present

## 2021-01-08 DIAGNOSIS — M7542 Impingement syndrome of left shoulder: Secondary | ICD-10-CM | POA: Diagnosis not present

## 2021-01-08 DIAGNOSIS — M25512 Pain in left shoulder: Secondary | ICD-10-CM | POA: Diagnosis not present

## 2021-01-08 DIAGNOSIS — M48062 Spinal stenosis, lumbar region with neurogenic claudication: Secondary | ICD-10-CM | POA: Diagnosis not present

## 2021-01-15 DIAGNOSIS — M48062 Spinal stenosis, lumbar region with neurogenic claudication: Secondary | ICD-10-CM | POA: Diagnosis not present

## 2021-01-15 DIAGNOSIS — M7542 Impingement syndrome of left shoulder: Secondary | ICD-10-CM | POA: Diagnosis not present

## 2021-01-15 DIAGNOSIS — M25512 Pain in left shoulder: Secondary | ICD-10-CM | POA: Diagnosis not present

## 2021-01-20 ENCOUNTER — Other Ambulatory Visit: Payer: Self-pay

## 2021-01-20 ENCOUNTER — Encounter: Payer: Self-pay | Admitting: Family

## 2021-01-20 ENCOUNTER — Ambulatory Visit (INDEPENDENT_AMBULATORY_CARE_PROVIDER_SITE_OTHER): Payer: PPO | Admitting: Family

## 2021-01-20 ENCOUNTER — Other Ambulatory Visit (HOSPITAL_COMMUNITY)
Admission: RE | Admit: 2021-01-20 | Discharge: 2021-01-20 | Disposition: A | Discharge status: Home / Self Care | Payer: PPO | Source: Ambulatory Visit | Attending: Family | Admitting: Family

## 2021-01-20 VITALS — BP 136/82 | HR 85 | Ht >= 80 in | Wt 252.8 lb

## 2021-01-20 DIAGNOSIS — R300 Dysuria: Secondary | ICD-10-CM

## 2021-01-20 LAB — POCT URINALYSIS DIPSTICK
Bilirubin, UA: NEGATIVE
Blood, UA: NEGATIVE
Glucose, UA: NEGATIVE
Ketones, UA: NEGATIVE
Leukocytes, UA: NEGATIVE
Nitrite, UA: NEGATIVE
Protein, UA: NEGATIVE
Spec Grav, UA: 1.025 (ref 1.010–1.025)
Urobilinogen, UA: 0.2 E.U./dL
pH, UA: 6 (ref 5.0–8.0)

## 2021-01-20 MED ORDER — SULFAMETHOXAZOLE-TRIMETHOPRIM 800-160 MG PO TABS: 1.0000 | ORAL_TABLET | Freq: Two times a day (BID) | ORAL | 0 refills | Status: DC

## 2021-01-20 NOTE — Patient Instructions (Signed)
Dysuria ?Dysuria is pain or discomfort during urination. The pain or discomfort may be felt in the part of the body that drains urine from the bladder (urethra) or in the surrounding tissue of the genitals. The pain may also be felt in the groin area, lower abdomen, or lower back. ?You may have to urinate frequently or have the sudden feeling that you have to urinate (urgency). Dysuria can affect anyone, but it is more common in females. Dysuria can be caused by many different things, including: ?Urinary tract infection. ?Kidney stones or bladder stones. ?Certain STIs (sexually transmitted infections), such as chlamydia. ?Dehydration. ?Inflammation of the tissues of the vagina. ?Use of certain medicines. ?Use of certain soaps or scented products that cause irritation. ?Follow these instructions at home: ?Medicines ?Take over-the-counter and prescription medicines only as told by your health care provider. ?If you were prescribed an antibiotic medicine, take it as told by your health care provider. Do not stop taking the antibiotic even if you start to feel better. ?Eating and drinking ? ?Drink enough fluid to keep your urine pale yellow. ?Avoid caffeinated beverages, tea, and alcohol. These beverages can irritate the bladder and make dysuria worse. In males, alcohol may irritate the prostate. ?General instructions ?Watch your condition for any changes. ?Urinate often. Avoid holding urine for long periods of time. ?If you are male, you should wipe from front to back after urinating or having a bowel movement. Use each piece of toilet paper only once. ?Empty your bladder after sex. ?Keep all follow-up visits. This is important. ?If you had any tests done to find the cause of dysuria, it is up to you to get your test results. Ask your health care provider, or the department that is doing the test, when your results will be ready. ?Contact a health care provider if: ?You have a fever. ?You develop pain in your back or  sides. ?You have nausea or vomiting. ?You have blood in your urine. ?You are not urinating as often as you usually do. ?Get help right away if: ?Your pain is severe and not relieved with medicines. ?You cannot eat or drink without vomiting. ?You are confused. ?You have a rapid heartbeat while resting. ?You have shaking or chills. ?You feel extremely weak. ?Summary ?Dysuria is pain or discomfort while urinating. Many different conditions can lead to dysuria. ?If you have dysuria, you may have to urinate frequently or have the sudden feeling that you have to urinate (urgency). ?Watch your condition for any changes. Keep all follow-up visits. ?Make sure that you urinate often and drink enough fluid to keep your urine pale yellow. ?This information is not intended to replace advice given to you by your health care provider. Make sure you discuss any questions you have with your health care provider. ?Document Revised: 02/01/2020 Document Reviewed: 02/01/2020 ?Elsevier Patient Education ? 2022 Elsevier Inc. ? ?

## 2021-01-21 LAB — URINE CULTURE
MICRO NUMBER:: 12136426
Result:: NO GROWTH
SPECIMEN QUALITY:: ADEQUATE

## 2021-01-21 NOTE — Progress Notes (Signed)
Acute Office Visit  Subjective:    Patient ID: John Keith, male    DOB: [birth year], 70 y.o.   MRN: [id number]  Chief Complaint  Patient presents with  . urinary symptoms    Pt c/o burning while urinating . Pt states it started last week. Pt states he's not having any other urinary symptoms    HPI Patient is in today with c/o burning with urination x 1 week. He denies being sexually active. No blood in his urine. Denies frequency and urgency. Has had similar symptoms in the past that were found to be a UTI.   Past Medical History:  Diagnosis Date  . Allergy   . Arthritis   . Carpal tunnel syndrome on left   . Chest pain of uncertain etiology 10/24/2019  . COVID-19    07/07/19  . CTS (carpal tunnel syndrome)    left  . DDD (degenerative disc disease), thoracolumbar    unknown location   . DDD (degenerative disc disease), thoracolumbar    multilevel  . Degenerative joint disease of left shoulder   . Degenerative joint disease of left shoulder    02/2011   . Diverticulosis    left colon (2008)   . ED (erectile dysfunction)    02/2011   . ED (erectile dysfunction)   . GERD (gastroesophageal reflux disease)   . H. pylori infection   . H. pylori infection   . Hyperlipidemia   . Hypertension   . Insomnia   . Lactose intolerance   . Lactose intolerance   . Low back pain   . PVC (premature ventricular contraction) 10/24/2019  . Shingles    07/2018  . Smoking    smoking since age 25 y.o  . Toe fracture, left    4th in 2014  . Vitamin D deficiency   . Vitamin D deficiency     Past Surgical History:  Procedure Laterality Date  . APPENDECTOMY    . BACK SURGERY     x 2 10991 and 1995 in [place]   . CARDIAC CATHETERIZATION     1999 nl  . COLONOSCOPY     2008 diverticulosis  . ESOPHAGOGASTRODUODENOSCOPY     h/o +h. pylori  . OTHER SURGICAL HISTORY     heart catherization 1999 normal   . SPINE SURGERY     x 2     Family History  Problem Relation Age of Onset  .  Heart disease Mother        age 20   . Throat cancer Father 34  . Cancer Father        throat   . Cancer Maternal Uncle        uncle ?maternal or paternal died colon cancer in his 73s   . Diabetes Maternal Aunt   . Heart attack Maternal Grandmother     Social History   Socioeconomic History  . Marital status: Married    Spouse name: Not on file  . Number of children: Not on file  . Years of education: Not on file  . Highest education level: Not on file  Occupational History  . Not on file  Tobacco Use  . Smoking status: Former    Packs/day: 0.50    Types: Cigarettes    Start date: 07/06/1971  . Smokeless tobacco: Never  . Tobacco comments:    06/26/20 quit date  Substance and Sexual Activity  . Alcohol use: No    Alcohol/week: 0.0 standard drinks  .  Drug use: No  . Sexual activity: Not on file  Other Topics Concern  . 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 Difficulty of Paying Living Expenses: Not hard at all  Food Insecurity: No Food Insecurity  . Worried About Programme researcher, broadcasting/film/video in the Last Year: Never true  . Ran Out of Food in the Last Year: Never true  Transportation Needs: No Transportation Needs  . Lack of Transportation (Medical): No  . Lack of Transportation (Non-Medical): No  Physical Activity: Sufficiently Active  . Days of Exercise per Week: 5 days  . Minutes of Exercise per Session: 30 min  Stress: No Stress Concern Present  . Feeling of Stress : Not at all  Social Connections: Unknown  . Frequency of Communication with Friends and Family: More than three times a week  . Frequency of Social Gatherings with Friends and Family: More than three times a week  . Attends Religious Services: Not on file  . Active Member of Clubs or Organizations: Yes   . Attends Banker Meetings: Not on file  . Marital Status: Married  Catering manager Violence: Not At Risk  . Fear of Current or Ex-Partner: No  . Emotionally Abused: No  . Physically Abused: No  . Sexually Abused: No    Outpatient Medications Prior to Visit  Medication Sig Dispense Refill  . amLODipine (NORVASC) 5 MG tablet Take 1 tablet (5 mg total) by mouth daily. Goal BP less than 130 (top)/less than 80 (bottom) 90 tablet 3  . Cholecalciferol (VITAMIN D-3) 125 MCG (5000 UT) TABS Take by mouth.    . ezetimibe (ZETIA) 10 MG tablet Take 1 tablet (10 mg total) by mouth daily. 90 tablet 3  . ferrous sulfate 325 (65 FE) MG tablet Take 325 mg by mouth daily with breakfast.    . gabapentin (NEURONTIN) 300 MG capsule Take 1-2 capsules (300-600 mg total) by mouth at bedtime. 300 mg at night x 2 weeks can increase to 2 pills at night week 3 and beyond at night 180 capsule 3  . losartan-hydrochlorothiazide (HYZAAR) 100-12.5 MG tablet Take 1 tablet by mouth daily. In am 90 tablet 3  . meloxicam (MOBIC) 15 MG tablet meloxicam 15 mg tablet    . Multiple Vitamin (MULTIVITAMIN) tablet Take 1 tablet by mouth daily.    . pantoprazole (PROTONIX) 40 MG tablet Take 1 tablet (40 mg total) by mouth daily. 30 min before food 90 tablet 3  . triamcinolone cream (KENALOG) 0.1 % Apply 1 application topically 2 (two) times daily as needed. Back itching 454 g 11   No facility-administered medications prior to visit.    Allergies  Allergen Reactions  . Crestor [Rosuvastatin Calcium]     Elevated muscle enzymes   . Lipitor [Atorvastatin Calcium]     Elevated muscle enzymes   . Tomato Itching    Review of Systems  Constitutional: Negative.   Respiratory: Negative.    Cardiovascular: Negative.   Gastrointestinal: Negative.   Genitourinary:  Positive for dysuria. Negative for frequency, genital sores and hematuria.  Musculoskeletal: Negative.   Skin: Negative.   Neurological: Negative.    Psychiatric/Behavioral: Negative.    All other systems reviewed  and are negative.     Objective:    Physical Exam Vitals and nursing note reviewed.  Constitutional:      Appearance: Normal appearance.  Cardiovascular:     Rate and Rhythm: Normal rate and regular rhythm.     Pulses: Normal pulses.     Heart sounds: Normal heart sounds.  Pulmonary:     Effort: Pulmonary effort is normal.     Breath sounds: Normal breath sounds.  Abdominal:     General: Abdomen is flat. Bowel sounds are normal.     Palpations: Abdomen is soft.  Musculoskeletal:        General: Normal range of motion.     Cervical back: Normal range of motion and neck supple.  Skin:    General: Skin is warm and dry.  Neurological:     General: No focal deficit present.     Mental Status: He is alert and oriented to person, place, and time.  Psychiatric:        Mood and Affect: Mood normal.        Behavior: Behavior normal.   BP 136/82   Pulse 85   Ht 6' 8.98" (2.057 m)   Wt 252 lb 12.8 oz (114.7 kg)   SpO2 93%   BMI 27.10 kg/m  Wt Readings from Last 3 Encounters:  01/20/21 252 lb 12.8 oz (114.7 kg)  12/29/20 249 lb 6.4 oz (113.1 kg)  10/23/20 249 lb (112.9 kg)    Health Maintenance Due  Topic Date Due  . COLON CANCER SCREENING ANNUAL FOBT  08/13/2016  . Zoster Vaccines- Shingrix (2 of 2) 08/02/2019  . COVID-19 Vaccine (4 - Booster) 08/08/2020    There are no preventive care reminders to display for this patient.   Lab Results  Component Value Date   TSH 1.05 12/13/2019   Lab Results  Component Value Date   WBC 6.9 07/10/2020   HGB 12.9 (L) 07/10/2020   HCT 39.1 07/10/2020   MCV 94.7 07/10/2020   PLT 267.0 07/10/2020   Lab Results  Component Value Date   NA 139 07/10/2020   K 4.0 07/10/2020   CO2 30 07/10/2020   GLUCOSE 82 07/10/2020   BUN 12 07/10/2020   CREATININE 1.10 07/10/2020   BILITOT 0.4 07/10/2020   ALKPHOS 81 07/10/2020   AST 26 07/10/2020   ALT 26 07/10/2020    PROT 7.1 07/10/2020   ALBUMIN 4.4 07/10/2020   CALCIUM 9.3 07/10/2020   ANIONGAP 10 08/08/2015   GFR 67.92 07/10/2020   Lab Results  Component Value Date   CHOL 167 12/13/2019   Lab Results  Component Value Date   HDL 49.60 12/13/2019   Lab Results  Component Value Date   LDLCALC 99 12/13/2019   Lab Results  Component Value Date   TRIG 91.0 12/13/2019   Lab Results  Component Value Date   CHOLHDL 3 12/13/2019   Lab Results  Component Value Date   HGBA1C 5.3 06/11/2019       Assessment & Plan:   Problem List Items Addressed This Visit   None Visit Diagnoses     Dysuria    -  Primary   Relevant Orders   POC Urinalysis Dipstick (Completed)   Urine Culture   Urine cytology ancillary only([place])        Meds ordered this encounter  Medications  . sulfamethoxazole-trimethoprim (BACTRIM DS) 800-160 MG tablet    Sig: Take 1 tablet by mouth 2 (two) times daily.  Dispense:  14 tablet    Refill:  0    Urinalysis is normal. Culture sent. Will treat with Bactrim for now and await urine culture. STI screening performed since this has occurred in the past although reports not sexually active.  Eulis Foster, FNP

## 2021-01-22 DIAGNOSIS — M25512 Pain in left shoulder: Secondary | ICD-10-CM | POA: Diagnosis not present

## 2021-01-22 DIAGNOSIS — M48062 Spinal stenosis, lumbar region with neurogenic claudication: Secondary | ICD-10-CM | POA: Diagnosis not present

## 2021-01-22 DIAGNOSIS — M7542 Impingement syndrome of left shoulder: Secondary | ICD-10-CM | POA: Diagnosis not present

## 2021-01-22 LAB — URINE CYTOLOGY ANCILLARY ONLY
Chlamydia: NEGATIVE
Comment: NEGATIVE
Comment: NORMAL
Neisseria Gonorrhea: NEGATIVE

## 2021-01-27 DIAGNOSIS — M48062 Spinal stenosis, lumbar region with neurogenic claudication: Secondary | ICD-10-CM | POA: Diagnosis not present

## 2021-01-27 DIAGNOSIS — M25512 Pain in left shoulder: Secondary | ICD-10-CM | POA: Diagnosis not present

## 2021-01-27 DIAGNOSIS — M7542 Impingement syndrome of left shoulder: Secondary | ICD-10-CM | POA: Diagnosis not present

## 2021-01-29 ENCOUNTER — Ambulatory Visit (INDEPENDENT_AMBULATORY_CARE_PROVIDER_SITE_OTHER): Payer: PPO | Admitting: Podiatry

## 2021-01-29 ENCOUNTER — Encounter: Payer: Self-pay | Admitting: Podiatry

## 2021-01-29 ENCOUNTER — Other Ambulatory Visit: Payer: Self-pay

## 2021-01-29 DIAGNOSIS — B351 Tinea unguium: Secondary | ICD-10-CM

## 2021-01-29 DIAGNOSIS — M79676 Pain in unspecified toe(s): Secondary | ICD-10-CM | POA: Diagnosis not present

## 2021-01-29 DIAGNOSIS — G629 Polyneuropathy, unspecified: Secondary | ICD-10-CM | POA: Diagnosis not present

## 2021-01-29 NOTE — Progress Notes (Signed)
  Subjective:  Patient ID: John Keith, male    DOB: [birth year],  MRN: [id number]  John Keith presents to clinic today for painful thick toenails that are difficult to trim. Pain interferes with ambulation. Aggravating factors include wearing enclosed shoe gear. Pain is relieved with periodic professional debridement.  He has been diagnosed with neuropathy and also has h/o spinal stenosis. He is on gabapentin 300 mg twice daily. Patient states he continues to have numbness and tingling in his feet, despite taking the gabapentin.  PCP is [name], Pasty Spillers, MD , and last visit was 10/23/2020.  Allergies  Allergen Reactions   Crestor [Rosuvastatin Calcium]     Elevated muscle enzymes    Lipitor [Atorvastatin Calcium]     Elevated muscle enzymes    Tomato Itching    Review of Systems: Negative except as noted in the HPI. Objective:   Constitutional John Keith is a pleasant 70 y.o. African American male, WD, WN in NAD. AAO x 3.   Vascular Capillary refill time to digits immediate b/l. Palpable pedal pulses b/l LE. Pedal hair present. Lower extremity skin temperature gradient within normal limits. No pain with calf compression b/l. No edema noted b/l lower extremities. No cyanosis or clubbing noted.  Neurologic Normal speech. Oriented to person, place, and time. Pt has subjective symptoms of neuropathy. Protective sensation intact 5/5 intact bilaterally with 10g monofilament b/l. Vibratory sensation intact b/l. Proprioception intact bilaterally.  Dermatologic Pedal skin with normal turgor, texture and tone b/l lower extremities. No open wounds b/l lower extremities. No interdigital macerations b/l lower extremities. Toenails 1-5 b/l elongated, discolored, dystrophic, thickened, crumbly with subungual debris and tenderness to dorsal palpation. No hyperkeratotic nor porokeratotic lesions present on today's visit.  Orthopedic: Normal muscle strength 5/5 to all lower extremity  muscle groups bilaterally. No pain crepitus or joint limitation noted with ROM b/l. Hallux valgus with bunion deformity noted b/l lower extremities. Patient ambulates independent of any assistive aids.   Radiographs: None Assessment:   1. Pain due to onychomycosis of toenail   2. Neuropathy    Plan:  -Examined patient. -We did discuss his neuropathy symptoms and I have asked him to discuss with his PCP on next visit. I did inform him of side effects of increased dosage of gabapentin. He related understanding. -Patient to continue soft, supportive shoe gear daily. -Toenails 1-5 b/l were debrided in length and girth with sterile nail nippers and dremel without iatrogenic bleeding.  -Patient to report any pedal injuries to medical professional immediately. -Patient/POA to call should there be question/concern in the interim.  Return in about 3 months (around 05/01/2021).  Freddie Breech, DPM

## 2021-02-03 DIAGNOSIS — M25512 Pain in left shoulder: Secondary | ICD-10-CM | POA: Diagnosis not present

## 2021-02-03 DIAGNOSIS — M48062 Spinal stenosis, lumbar region with neurogenic claudication: Secondary | ICD-10-CM | POA: Diagnosis not present

## 2021-02-03 DIAGNOSIS — M7542 Impingement syndrome of left shoulder: Secondary | ICD-10-CM | POA: Diagnosis not present

## 2021-02-11 DIAGNOSIS — M7542 Impingement syndrome of left shoulder: Secondary | ICD-10-CM | POA: Diagnosis not present

## 2021-02-11 DIAGNOSIS — M48062 Spinal stenosis, lumbar region with neurogenic claudication: Secondary | ICD-10-CM | POA: Diagnosis not present

## 2021-02-11 DIAGNOSIS — M25512 Pain in left shoulder: Secondary | ICD-10-CM | POA: Diagnosis not present

## 2021-02-17 ENCOUNTER — Telehealth: Payer: Self-pay | Admitting: Internal Medicine

## 2021-02-17 DIAGNOSIS — M7542 Impingement syndrome of left shoulder: Secondary | ICD-10-CM | POA: Diagnosis not present

## 2021-02-17 DIAGNOSIS — M48062 Spinal stenosis, lumbar region with neurogenic claudication: Secondary | ICD-10-CM | POA: Diagnosis not present

## 2021-02-17 DIAGNOSIS — M25512 Pain in left shoulder: Secondary | ICD-10-CM | POA: Diagnosis not present

## 2021-02-17 NOTE — Telephone Encounter (Signed)
I do recommend appt for evaluation to determine medication dosing needed.

## 2021-02-17 NOTE — Telephone Encounter (Signed)
Patient currently on: Gabapentin 300 mg Take 1-2 capsules (300-600 mg total) by mouth at bedtime. 300 mg at night x 2 weeks can increase to 2 pills at night week 3 and beyond at night  Patient last seen 01/2021, does he need an appointment to have medication increased?

## 2021-02-17 NOTE — Telephone Encounter (Signed)
Patient only available to be seen on Monday or Thursday. No open appointments on Monday. Patient scheduled to se Dr Birdie Sons Thursday at 4:15.

## 2021-02-17 NOTE — Telephone Encounter (Signed)
PT called to advise that he saw his foot doctor and the Dr advise him for his pain he should talk to his PCP about increasing his gabapentin (NEURONTIN) 300 MG capsule

## 2021-02-24 DIAGNOSIS — M48062 Spinal stenosis, lumbar region with neurogenic claudication: Secondary | ICD-10-CM | POA: Diagnosis not present

## 2021-02-24 DIAGNOSIS — M7542 Impingement syndrome of left shoulder: Secondary | ICD-10-CM | POA: Diagnosis not present

## 2021-02-24 DIAGNOSIS — M25512 Pain in left shoulder: Secondary | ICD-10-CM | POA: Diagnosis not present

## 2021-02-26 ENCOUNTER — Other Ambulatory Visit: Payer: Self-pay

## 2021-02-26 ENCOUNTER — Encounter: Payer: Self-pay | Admitting: Family Medicine

## 2021-02-26 ENCOUNTER — Ambulatory Visit (INDEPENDENT_AMBULATORY_CARE_PROVIDER_SITE_OTHER): Payer: PPO | Admitting: Family Medicine

## 2021-02-26 VITALS — BP 130/80 | HR 62 | Temp 98.5°F | Ht >= 80 in | Wt 254.0 lb

## 2021-02-26 DIAGNOSIS — G629 Polyneuropathy, unspecified: Secondary | ICD-10-CM

## 2021-02-26 MED ORDER — GABAPENTIN 300 MG PO CAPS: 900.0000 mg | ORAL_CAPSULE | Freq: Every day | ORAL | 3 refills | Status: DC

## 2021-02-26 NOTE — Progress Notes (Signed)
Marikay Alar, MD Phone: [phone]  John Keith is a 70 y.o. male who presents today for same day visit.   Neuropathy: This has been an ongoing issue since earlier this year.  He has been on 600 mg of gabapentin nightly with some very mild benefit.  It does not make him drowsy.  He reports bilateral numbness and tingling in his feet particularly in his toes.  Notes it wakes him up at night.  It does not bother her much during the daytime.  Social History   Tobacco Use  Smoking Status Former   Packs/day: 0.50   Types: Cigarettes   Start date: 07/06/1971  Smokeless Tobacco Never  Tobacco Comments   06/26/20 quit date    Current Outpatient Medications on File Prior to Visit  Medication Sig Dispense Refill   amLODipine (NORVASC) 5 MG tablet Take 1 tablet (5 mg total) by mouth daily. Goal BP less than 130 (top)/less than 80 (bottom) 90 tablet 3   Cholecalciferol (VITAMIN D-3) 125 MCG (5000 UT) TABS Take by mouth.     ezetimibe (ZETIA) 10 MG tablet Take 1 tablet (10 mg total) by mouth daily. 90 tablet 3   ferrous sulfate 325 (65 FE) MG tablet Take 325 mg by mouth daily with breakfast.     losartan-hydrochlorothiazide (HYZAAR) 100-12.5 MG tablet Take 1 tablet by mouth daily. In am 90 tablet 3   meloxicam (MOBIC) 15 MG tablet meloxicam 15 mg tablet     Multiple Vitamin (MULTIVITAMIN) tablet Take 1 tablet by mouth daily.     pantoprazole (PROTONIX) 40 MG tablet Take 1 tablet (40 mg total) by mouth daily. 30 min before food 90 tablet 3   sulfamethoxazole-trimethoprim (BACTRIM DS) 800-160 MG tablet Take 1 tablet by mouth 2 (two) times daily. 14 tablet 0   triamcinolone cream (KENALOG) 0.1 % Apply 1 application topically 2 (two) times daily as needed. Back itching 454 g 11   No current facility-administered medications on file prior to visit.     ROS see history of present illness  Objective  Physical Exam Vitals:   02/26/21 1624  BP: 130/80  Pulse: 62  Temp: 98.5 F (36.9  C)  SpO2: 97%    BP Readings from Last 3 Encounters:  02/26/21 130/80  01/20/21 136/82  12/29/20 126/74   Wt Readings from Last 3 Encounters:  02/26/21 254 lb (115.2 kg)  01/20/21 252 lb 12.8 oz (114.7 kg)  12/29/20 249 lb 6.4 oz (113.1 kg)    Physical Exam Foot Exam - Simple   Simple Foot Form Foot exam was performed with the following findings: Yes 02/26/2021  4:37 PM  Visual Inspection No deformities, no ulcerations, no other skin breakdown bilaterally: Yes Sensation Testing See comments: Yes Pulse Check Posterior Tibialis and Dorsalis pulse intact bilaterally: Yes Comments Intact to light touch, decreased monofilament testing in his toes on the plantar surface      Assessment/Plan: Please see individual problem list.  Problem List Items Addressed This Visit     Neuropathy - Primary    This continues to be an issue.  We will increase his gabapentin to 900 mg nightly.  He will monitor for drowsiness with this.  If he does get drowsy he will let us know.  We will check an A1c and a B12.  I did discuss referral to neurology though he defers this at this time.      Relevant Medications   gabapentin (NEURONTIN) 300 MG capsule   Other Relevant  Orders   HgB A1c   B12     Return for as scheduled with PCP.  This visit occurred during the SARS-CoV-2 public health emergency.  Safety protocols were in place, including screening questions prior to the visit, additional usage of staff PPE, and extensive cleaning of exam room while observing appropriate contact time as indicated for disinfecting solutions.    Marikay Alar, MD [facility]

## 2021-02-26 NOTE — Patient Instructions (Signed)
Nice to see you. Please try the gabapentin 900 mg at night.  If this makes you drowsy the next morning for drowsy at any point please let us know. We will let you know what your labs show.

## 2021-02-26 NOTE — Assessment & Plan Note (Signed)
This continues to be an issue.  We will increase his gabapentin to 900 mg nightly.  He will monitor for drowsiness with this.  If he does get drowsy he will let us know.  We will check an A1c and a B12.  I did discuss referral to neurology though he defers this at this time.

## 2021-02-27 ENCOUNTER — Telehealth: Payer: Self-pay

## 2021-02-27 LAB — HEMOGLOBIN A1C: Hgb A1c MFr Bld: 5.6 % (ref 4.6–6.5)

## 2021-02-27 LAB — VITAMIN B12: Vitamin B-12: 486 pg/mL (ref 211–911)

## 2021-02-27 NOTE — Telephone Encounter (Signed)
Confirmed fax for [employer] Plan of care form to [facility]. Sent to scan

## 2021-03-03 DIAGNOSIS — M48062 Spinal stenosis, lumbar region with neurogenic claudication: Secondary | ICD-10-CM | POA: Diagnosis not present

## 2021-03-03 DIAGNOSIS — M7542 Impingement syndrome of left shoulder: Secondary | ICD-10-CM | POA: Diagnosis not present

## 2021-03-03 DIAGNOSIS — M25512 Pain in left shoulder: Secondary | ICD-10-CM | POA: Diagnosis not present

## 2021-03-10 DIAGNOSIS — M25512 Pain in left shoulder: Secondary | ICD-10-CM | POA: Diagnosis not present

## 2021-03-10 DIAGNOSIS — M7542 Impingement syndrome of left shoulder: Secondary | ICD-10-CM | POA: Diagnosis not present

## 2021-03-10 DIAGNOSIS — M48062 Spinal stenosis, lumbar region with neurogenic claudication: Secondary | ICD-10-CM | POA: Diagnosis not present

## 2021-03-17 DIAGNOSIS — M7542 Impingement syndrome of left shoulder: Secondary | ICD-10-CM | POA: Diagnosis not present

## 2021-03-17 DIAGNOSIS — M48062 Spinal stenosis, lumbar region with neurogenic claudication: Secondary | ICD-10-CM | POA: Diagnosis not present

## 2021-03-17 DIAGNOSIS — M25512 Pain in left shoulder: Secondary | ICD-10-CM | POA: Diagnosis not present

## 2021-03-24 DIAGNOSIS — M48062 Spinal stenosis, lumbar region with neurogenic claudication: Secondary | ICD-10-CM | POA: Diagnosis not present

## 2021-03-24 DIAGNOSIS — M7542 Impingement syndrome of left shoulder: Secondary | ICD-10-CM | POA: Diagnosis not present

## 2021-03-24 DIAGNOSIS — M25512 Pain in left shoulder: Secondary | ICD-10-CM | POA: Diagnosis not present

## 2021-03-31 DIAGNOSIS — M48062 Spinal stenosis, lumbar region with neurogenic claudication: Secondary | ICD-10-CM | POA: Diagnosis not present

## 2021-03-31 DIAGNOSIS — M25512 Pain in left shoulder: Secondary | ICD-10-CM | POA: Diagnosis not present

## 2021-03-31 DIAGNOSIS — M7542 Impingement syndrome of left shoulder: Secondary | ICD-10-CM | POA: Diagnosis not present

## 2021-04-09 DIAGNOSIS — M25512 Pain in left shoulder: Secondary | ICD-10-CM | POA: Diagnosis not present

## 2021-04-09 DIAGNOSIS — M7542 Impingement syndrome of left shoulder: Secondary | ICD-10-CM | POA: Diagnosis not present

## 2021-04-09 DIAGNOSIS — M48062 Spinal stenosis, lumbar region with neurogenic claudication: Secondary | ICD-10-CM | POA: Diagnosis not present

## 2021-04-30 ENCOUNTER — Ambulatory Visit: Payer: PPO | Admitting: Internal Medicine

## 2021-04-30 ENCOUNTER — Telehealth: Payer: Self-pay | Admitting: Internal Medicine

## 2021-04-30 NOTE — Telephone Encounter (Signed)
Patient no-showed today's appointment; provider notified for review of record. Letter sent to reschedule  

## 2021-05-14 ENCOUNTER — Other Ambulatory Visit: Payer: Self-pay

## 2021-05-14 ENCOUNTER — Encounter: Payer: Self-pay | Admitting: Podiatry

## 2021-05-14 ENCOUNTER — Ambulatory Visit: Payer: PPO | Admitting: Podiatry

## 2021-05-14 DIAGNOSIS — M79676 Pain in unspecified toe(s): Secondary | ICD-10-CM | POA: Diagnosis not present

## 2021-05-14 DIAGNOSIS — G629 Polyneuropathy, unspecified: Secondary | ICD-10-CM

## 2021-05-14 DIAGNOSIS — B351 Tinea unguium: Secondary | ICD-10-CM | POA: Diagnosis not present

## 2021-05-14 NOTE — Progress Notes (Signed)
Subjective: John Keith is a 70 y.o. male patient seen today for at risk foot care with h/o peripheral neuropathy. He is here for follow up of  painful thick toenails that are difficult to trim. Pain interferes with ambulation. Aggravating factors include wearing enclosed shoe gear. Pain is relieved with periodic professional debridement.  New problems reported today: None.  He did follow up with his PCP and his gabapentin was increased from 300 mg twice daily to 300 mg three times daily. He reports improvement in symptoms with this dosage. He also has started wearing compression hose daily and reports his legs feel better. He does report itching at knee joint when he removes them.  PCP is [name], Pasty Spillers, MD. Last visit was: 10/23/2020.  Allergies  Allergen Reactions   Crestor [Rosuvastatin Calcium]     Elevated muscle enzymes    Lipitor [Atorvastatin Calcium]     Elevated muscle enzymes    Tomato Itching    Objective: Physical Exam  General: Patient is a pleasant 70 y.o. African American male WD, WN in NAD. AAO x 3.   Neurovascular Examination: CFT immediate b/l LE. Palpable DP/PT pulses b/l LE. Digital hair present b/l. Skin temperature gradient WNL b/l. No pain with calf compression b/l. No edema noted b/l. Lower extremity skin temperature gradient within normal limits.  Pt has subjective symptoms of neuropathy. Protective sensation intact 5/5 intact bilaterally with 10g monofilament b/l. Vibratory sensation intact b/l. Proprioception intact bilaterally.  Dermatological:  Pedal integument with normal turgor, texture and tone b/l LE. No open wounds b/l. No interdigital macerations b/l. Toenails 1-5 b/l elongated, thickened, discolored with subungual debris. +Tenderness with dorsal palpation of nailplates. No hyperkeratotic or porokeratotic lesions present. Toenails 1-5 b/l elongated, discolored, dystrophic, thickened, crumbly with subungual debris and tenderness to  dorsal palpation.  Musculoskeletal:  Normal muscle strength 5/5 to all lower extremity muscle groups bilaterally. HAV with bunion deformity noted b/l LE. Hammertoe deformity noted 2-5 b/l. Patient ambulates independent of any assistive aids.  Assessment: 1. Pain due to onychomycosis of toenail   2. Neuropathy    Plan: Patient was evaluated and treated and all questions answered. Consent given for treatment as described below: -Examined patient. -He has improvement in neuropathy symptoms with gabapentin 300 mg po tid. I have asked him to pull stockings down from the knee joint as I suspect he is getting flesh irritation from the band of the stocking. He will try this and see if the itching goes away. -Patient to continue soft, supportive shoe gear daily. -Toenails 1-5 b/l were debrided in length and girth with sterile nail nippers and dremel without iatrogenic bleeding.  -Patient/POA to call should there be question/concern in the interim.  Return in about 3 months (around 08/14/2021).  Freddie Breech, DPM

## 2021-05-15 ENCOUNTER — Encounter: Payer: Self-pay | Admitting: Internal Medicine

## 2021-05-15 ENCOUNTER — Telehealth: Payer: Self-pay | Admitting: Internal Medicine

## 2021-05-15 ENCOUNTER — Telehealth: Payer: Self-pay

## 2021-05-15 ENCOUNTER — Other Ambulatory Visit: Payer: Self-pay

## 2021-05-15 ENCOUNTER — Other Ambulatory Visit: Payer: Self-pay | Admitting: Internal Medicine

## 2021-05-15 ENCOUNTER — Ambulatory Visit (INDEPENDENT_AMBULATORY_CARE_PROVIDER_SITE_OTHER): Payer: PPO | Admitting: Internal Medicine

## 2021-05-15 VITALS — BP 130/78 | HR 84 | Temp 97.0°F | Ht >= 80 in | Wt 253.2 lb

## 2021-05-15 DIAGNOSIS — I10 Essential (primary) hypertension: Secondary | ICD-10-CM | POA: Diagnosis not present

## 2021-05-15 DIAGNOSIS — R200 Anesthesia of skin: Secondary | ICD-10-CM | POA: Diagnosis not present

## 2021-05-15 DIAGNOSIS — Z1389 Encounter for screening for other disorder: Secondary | ICD-10-CM

## 2021-05-15 DIAGNOSIS — L309 Dermatitis, unspecified: Secondary | ICD-10-CM

## 2021-05-15 DIAGNOSIS — E785 Hyperlipidemia, unspecified: Secondary | ICD-10-CM

## 2021-05-15 DIAGNOSIS — Z1329 Encounter for screening for other suspected endocrine disorder: Secondary | ICD-10-CM

## 2021-05-15 DIAGNOSIS — R9389 Abnormal findings on diagnostic imaging of other specified body structures: Secondary | ICD-10-CM

## 2021-05-15 DIAGNOSIS — R079 Chest pain, unspecified: Secondary | ICD-10-CM

## 2021-05-15 DIAGNOSIS — Z125 Encounter for screening for malignant neoplasm of prostate: Secondary | ICD-10-CM | POA: Diagnosis not present

## 2021-05-15 DIAGNOSIS — K259 Gastric ulcer, unspecified as acute or chronic, without hemorrhage or perforation: Secondary | ICD-10-CM | POA: Diagnosis not present

## 2021-05-15 DIAGNOSIS — M542 Cervicalgia: Secondary | ICD-10-CM

## 2021-05-15 DIAGNOSIS — J3489 Other specified disorders of nose and nasal sinuses: Secondary | ICD-10-CM

## 2021-05-15 DIAGNOSIS — F17210 Nicotine dependence, cigarettes, uncomplicated: Secondary | ICD-10-CM | POA: Diagnosis not present

## 2021-05-15 DIAGNOSIS — R109 Unspecified abdominal pain: Secondary | ICD-10-CM | POA: Diagnosis not present

## 2021-05-15 DIAGNOSIS — M5030 Other cervical disc degeneration, unspecified cervical region: Secondary | ICD-10-CM

## 2021-05-15 LAB — CBC WITH DIFFERENTIAL/PLATELET
Basophils Absolute: 0 10*3/uL (ref 0.0–0.1)
Basophils Relative: 0.6 % (ref 0.0–3.0)
Eosinophils Absolute: 0.1 10*3/uL (ref 0.0–0.7)
Eosinophils Relative: 2.2 % (ref 0.0–5.0)
HCT: 38.8 % — ABNORMAL LOW (ref 39.0–52.0)
Hemoglobin: 12.5 g/dL — ABNORMAL LOW (ref 13.0–17.0)
Lymphocytes Relative: 31 % (ref 12.0–46.0)
Lymphs Abs: 2 10*3/uL (ref 0.7–4.0)
MCHC: 32.3 g/dL (ref 30.0–36.0)
MCV: 95.1 fl (ref 78.0–100.0)
Monocytes Absolute: 0.5 10*3/uL (ref 0.1–1.0)
Monocytes Relative: 7.9 % (ref 3.0–12.0)
Neutro Abs: 3.7 10*3/uL (ref 1.4–7.7)
Neutrophils Relative %: 58.3 % (ref 43.0–77.0)
Platelets: 294 10*3/uL (ref 150.0–400.0)
RBC: 4.08 Mil/uL — ABNORMAL LOW (ref 4.22–5.81)
RDW: 13.3 % (ref 11.5–15.5)
WBC: 6.3 10*3/uL (ref 4.0–10.5)

## 2021-05-15 LAB — COMPREHENSIVE METABOLIC PANEL
ALT: 25 U/L (ref 0–53)
AST: 26 U/L (ref 0–37)
Albumin: 4.4 g/dL (ref 3.5–5.2)
Alkaline Phosphatase: 88 U/L (ref 39–117)
BUN: 17 mg/dL (ref 6–23)
CO2: 29 mEq/L (ref 19–32)
Calcium: 9.8 mg/dL (ref 8.4–10.5)
Chloride: 103 mEq/L (ref 96–112)
Creatinine, Ser: 1.13 mg/dL (ref 0.40–1.50)
GFR: 65.37 mL/min (ref >60.00)
Glucose, Bld: 81 mg/dL (ref 70–99)
Potassium: 3.8 mEq/L (ref 3.5–5.1)
Sodium: 140 mEq/L (ref 135–145)
Total Bilirubin: 0.4 mg/dL (ref 0.2–1.2)
Total Protein: 7.4 g/dL (ref 6.0–8.3)

## 2021-05-15 LAB — LIPID PANEL
Cholesterol: 176 mg/dL (ref 0–200)
HDL: 45.8 mg/dL (ref >39.00)
NonHDL: 130.26
Total CHOL/HDL Ratio: 4
Triglycerides: 204 mg/dL — ABNORMAL HIGH (ref 0.0–149.0)
VLDL: 40.8 mg/dL — ABNORMAL HIGH (ref 0.0–40.0)

## 2021-05-15 LAB — PSA: PSA: 1.35 ng/mL (ref 0.10–4.00)

## 2021-05-15 LAB — TSH: TSH: 0.88 u[IU]/mL (ref 0.35–5.50)

## 2021-05-15 LAB — TROPONIN I (HIGH SENSITIVITY): High Sens Troponin I: 34 ng/L (ref 2–17)

## 2021-05-15 LAB — LDL CHOLESTEROL, DIRECT: Direct LDL: 108 mg/dL

## 2021-05-15 MED ORDER — PANTOPRAZOLE SODIUM 40 MG PO TBEC: 40.0000 mg | DELAYED_RELEASE_TABLET | Freq: Every day | ORAL | 3 refills | Status: DC

## 2021-05-15 MED ORDER — AMLODIPINE BESYLATE 5 MG PO TABS: 5.0000 mg | ORAL_TABLET | Freq: Every day | ORAL | 3 refills | Status: DC

## 2021-05-15 MED ORDER — MUPIROCIN 2 % EX OINT: 1.0000 "application " | TOPICAL_OINTMENT | Freq: Two times a day (BID) | CUTANEOUS | 0 refills | Status: DC

## 2021-05-15 MED ORDER — EZETIMIBE 10 MG PO TABS: 10.0000 mg | ORAL_TABLET | Freq: Every day | ORAL | 3 refills | Status: DC

## 2021-05-15 MED ORDER — BETAMETHASONE VALERATE 0.1 % EX CREA: TOPICAL_CREAM | Freq: Two times a day (BID) | CUTANEOUS | 0 refills | Status: DC

## 2021-05-15 MED ORDER — LOSARTAN POTASSIUM-HCTZ 100-12.5 MG PO TABS: 1.0000 | ORAL_TABLET | Freq: Every day | ORAL | 3 refills | Status: DC

## 2021-05-15 NOTE — Patient Instructions (Addendum)
[phone]  [phone]  Dr. Carlyle Lipa [place] [address]     Colonoscopy 12/17/16 Dr. Virgil Benedict  -had 12/17/16  Call and see when due for colonoscopy  [phone]  Neck Exercises Ask your health care provider which exercises are safe for you. Do exercises exactly as told by your health care provider and adjust them as directed. It is normal to feel mild stretching, pulling, tightness, or discomfort as you do these exercises. Stop right away if you feel sudden pain or your pain gets worse. Do not begin these exercises until told by your health care provider. Neck exercises can be important for many reasons. They can improve strength and maintain flexibility in your neck, which will help your upper back and prevent neck pain. Stretching exercises Rotation neck stretching  Sit in a chair or stand up. Place your feet flat on the floor, shoulder-width apart. Slowly turn your head (rotate) to the right until a slight stretch is felt. Turn it all the way to the right so you can look over your right shoulder. Do not tilt or tip your head. Hold this position for 10-30 seconds. Slowly turn your head (rotate) to the left until a slight stretch is felt. Turn it all the way to the left so you can look over your left shoulder. Do not tilt or tip your head. Hold this position for 10-30 seconds. Repeat __________ times. Complete this exercise __________ times a day. Neck retraction  Sit in a sturdy chair or stand up. Look straight ahead. Do not bend your neck. Use your fingers to push your chin backward (retraction). Do not bend your neck for this movement. Continue to face straight ahead. If you are doing the exercise properly, you will feel a slight sensation in your throat and a stretch at the back of your neck. Hold the stretch for 1-2 seconds. Repeat __________ times. Complete this exercise __________ times a day. Strengthening exercises Neck  press  Lie on your back on a firm bed or on the floor with a pillow under your head. Use your neck muscles to push your head down on the pillow and straighten your spine. Hold the position as well as you can. Keep your head facing up (in a neutral position) and your chin tucked. Slowly count to 5 while holding this position. Repeat __________ times. Complete this exercise __________ times a day. Isometrics These are exercises in which you strengthen the muscles in your neck while keeping your neck still (isometrics). Sit in a supportive chair and place your hand on your forehead. Keep your head and face facing straight ahead. Do not flex or extend your neck while doing isometrics. Push forward with your head and neck while pushing back with your hand. Hold for 10 seconds. Do the sequence again, this time putting your hand against the back of your head. Use your head and neck to push backward against the hand pressure. Finally, do the same exercise on either side of your head, pushing sideways against the pressure of your hand. Repeat __________ times. Complete this exercise __________ times a day. Prone head lifts  Lie face-down (prone position), resting on your elbows so that your chest and upper back are raised. Start with your head facing downward, near your chest. Position your chin either on or near your chest. Slowly lift your head upward. Lift until you are looking straight ahead. Then continue lifting your head as far back as you can comfortably stretch.  Hold your head up for 5 seconds. Then slowly lower it to your starting position. Repeat __________ times. Complete this exercise __________ times a day. Supine head lifts  Lie on your back (supine position), bending your knees to point to the ceiling and keeping your feet flat on the floor. Lift your head slowly off the floor, raising your chin toward your chest. Hold for 5 seconds. Repeat __________ times. Complete this exercise  __________ times a day. Scapular retraction  Stand with your arms at your sides. Look straight ahead. Slowly pull both shoulders (scapulae) backward and downward (retraction) until you feel a stretch between your shoulder blades in your upper back. Hold for 10-30 seconds. Relax and repeat. Repeat __________ times. Complete this exercise __________ times a day. Contact a health care provider if: Your neck pain or discomfort gets worse when you do an exercise. Your neck pain or discomfort does not improve within 2 hours after you exercise. If you have any of these problems, stop exercising right away. Do not do the exercises again unless your health care provider says that you can. Get help right away if: You develop sudden, severe neck pain. If this happens, stop exercising right away. Do not do the exercises again unless your health care provider says that you can. This information is not intended to replace advice given to you by your health care provider. Make sure you discuss any questions you have with your health care provider. Document Revised: 12/16/2020 Document Reviewed: 12/16/2020 Elsevier Patient Education  2022 [employer]   Paresthesia Paresthesia is a burning or prickling feeling. This feeling can happen in any part of the body. It often happens in the hands, arms, legs, or feet. Usually, it is not painful. In most cases, the feeling goes away in a short time and is not a sign of a serious problem. If you have paresthesia that lasts a long time, you need to see your doctor. Follow these instructions at home: Nutrition Eat a healthy diet. This includes: Eating foods that are high in fiber. These include beans, whole grains, and fresh fruits and vegetables. Limiting foods that are high in fat and sugar. These include fried or sweet foods.  Alcohol use  Do not drink alcohol if: Your doctor tells you not to drink. You are pregnant, may be pregnant, or are planning to  become pregnant. If you drink alcohol: Limit how much you have to: 0-1 drink a day for women. 0-2 drinks a day for men. Know how much alcohol is in your drink. In the [place] one drink equals one 12 oz bottle of beer (355 mL), one 5 oz glass of wine (148 mL), or one 1 oz glass of hard liquor (44 mL). General instructions Take over-the-counter and prescription medicines only as told by your doctor. Do not smoke or use any products that contain nicotine or tobacco. If you need help quitting, ask your doctor. If you have diabetes, work with your doctor to make sure your blood sugar stays in a healthy range. If your feet feel numb: Check for redness, warmth, and swelling every day. Wear padded socks and comfortable shoes. These help protect your feet. Keep all follow-up visits. Contact a doctor if: You have paresthesia that gets worse or does not go away. You lose feeling (have numbness) after an injury. Your burning or prickling feeling gets worse when you walk. You have pain or cramps. You feel dizzy or you faint. You have a rash. Get help right  away if: You feel weak or have new weakness in an arm or leg. You have trouble walking or moving. You have problems speaking, understanding, or seeing. You feel confused. You cannot control when you pee (urinate) or poop (have a bowel movement). These symptoms may be an emergency. Get help right away. Call 911. Do not wait to see if the symptoms will go away. Do not drive yourself to the hospital. Summary Paresthesia is a burning or prickling feeling. It often happens in the hands, arms, legs, or feet. In most cases, the feeling goes away in a short time and is not a sign of a serious problem. If you have paresthesia that lasts a long time, you need to be seen by your doctor. This information is not intended to replace advice given to you by your health care provider. Make sure you discuss any questions you have with your health care  provider. Document Revised: 03/02/2021 Document Reviewed: 03/02/2021 Elsevier Patient Education  2022 Else[employer] Inc.   Nonspecific Chest Pain, Adult Chest pain is an uncomfortable, tight, or painful feeling in the chest. The pain can feel like a crushing, aching, or squeezing pressure. A person can feel a burning or tingling sensation. Chest pain can also be felt in your back, neck, jaw, shoulder, or arm. This pain can be worse when you move, sneeze, or take a deep breath. Chest pain can be caused by a condition that is life-threatening. This must be treated right away. It can also be caused by something that is not life-threatening. If you have chest pain, it can be hard to know the difference, so it is important to get help right away to make sure that you do not have a serious condition. Some life-threatening causes of chest pain include: Heart attack. A tear in the body's main blood vessel (aortic dissection). Inflammation around your heart (pericarditis). A problem in the lungs, such as a blood clot (pulmonary embolism) or a collapsed lung (pneumothorax). Some non life-threatening causes of chest pain include: Heartburn. Anxiety or stress. Damage to the bones, muscles, and cartilage that make up your chest wall. Pneumonia or bronchitis. Shingles infection (varicella-zoster virus). Your chest pain may come and go. It may also be constant. Your health care provider will do tests and other studies to find the cause of your pain. Treatment will depend on the cause of your chest pain. Follow these instructions at home: Medicines Take over-the-counter and prescription medicines only as told by your health care provider. If you were prescribed an antibiotic medicine, take it as told by your health care provider. Do not stop taking the antibiotic even if you start to feel better. Activity Avoid any activities that cause chest pain. Do not lift anything that is heavier than 10 lb (4.5 kg), or  the limit that you are told, until your health care provider says that it is safe. Rest as directed by your health care provider. Return to your normal activities only as told by your health care provider. Ask your health care provider what activities are safe for you. Lifestyle   Do not use any products that contain nicotine or tobacco, such as cigarettes, e-cigarettes, and chewing tobacco. If you need help quitting, ask your health care provider. Do not drink alcohol. Make healthy lifestyle changes as recommended. These may include: Getting regular exercise. Ask your health care provider to suggest some exercises that are safe for you. Eating a heart-healthy diet. This includes plenty of fresh fruits and vegetables,  whole grains, low-fat (lean) protein, and low-fat dairy products. A dietitian can help you find healthy eating options. Maintaining a healthy weight. Managing any other health conditions you may have, such as high blood pressure (hypertension) or diabetes. Reducing stress, such as with yoga or relaxation techniques. General instructions Pay attention to any changes in your symptoms. It is up to you to get the results of any tests that were done. Ask your health care provider, or the department that is doing the tests, when your results will be ready. Keep all follow-up visits as told by your health care provider. This is important. You may be asked to go for further testing if your chest pain does not go away. Contact a health care provider if: Your chest pain does not go away. You feel depressed. You have a fever. You notice changes in your symptoms or develop new symptoms. Get help right away if: Your chest pain gets worse. You have a cough that gets worse, or you cough up blood. You have severe pain in your abdomen. You faint. You have sudden, unexplained chest discomfort. You have sudden, unexplained discomfort in your arms, back, neck, or jaw. You have shortness of  breath at any time. You suddenly start to sweat, or your skin gets clammy. You feel nausea or you vomit. You suddenly feel lightheaded or dizzy. You have severe weakness, or unexplained weakness or fatigue. Your heart begins to beat quickly, or it feels like it is skipping beats. These symptoms may represent a serious problem that is an emergency. Do not wait to see if the symptoms will go away. Get medical help right away. Call your local emergency services (911 in the [place] Do not drive yourself to the hospital. Summary Chest pain can be caused by a condition that is serious and requires urgent treatment. It may also be caused by something that is not life-threatening. Your health care provider may do lab tests and other studies to find the cause of your pain. Follow your health care provider's instructions on taking medicines, making lifestyle changes, and getting emergency treatment if symptoms become worse. Keep all follow-up visits as told by your health care provider. This includes visits for any further testing if your chest pain does not go away. This information is not intended to replace advice given to you by your health care provider. Make sure you discuss any questions you have with your health care provider. Document Revised: 09/04/2020 Document Reviewed: 09/04/2020 Elsevier Patient Education  2022 [employer].  Food Choices for Gastroesophageal Reflux Disease, Adult When you have gastroesophageal reflux disease (GERD), the foods you eat and your eating habits are very important. Choosing the right foods can help ease the discomfort of GERD. Consider working with a dietitian to help you make healthy food choices. What are tips for following this plan? Reading food labels Look for foods that are low in saturated fat. Foods that have less than 5% of daily value (DV) of fat and 0 g of trans fats may help with your symptoms. Cooking Cook foods using methods other than frying. This  may include baking, steaming, grilling, or broiling. These are all methods that do not need a lot of fat for cooking. To add flavor, try to use herbs that are low in spice and acidity. Meal planning  Choose healthy foods that are low in fat, such as fruits, vegetables, whole grains, low-fat dairy products, lean meats, fish, and poultry. Eat frequent, small meals instead of three large  meals each day. Eat your meals slowly, in a relaxed setting. Avoid bending over or lying down until 2-3 hours after eating. Limit high-fat foods such as fatty meats or fried foods. Limit your intake of fatty foods, such as oils, butter, and shortening. Avoid the following as told by your health care provider: Foods that cause symptoms. These may be different for different people. Keep a food diary to keep track of foods that cause symptoms. Alcohol. Drinking large amounts of liquid with meals. Eating meals during the 2-3 hours before bed. Lifestyle Maintain a healthy weight. Ask your health care provider what weight is healthy for you. If you need to lose weight, work with your health care provider to do so safely. Exercise for at least 30 minutes on 5 or more days each week, or as told by your health care provider. Avoid wearing clothes that fit tightly around your waist and chest. Do not use any products that contain nicotine or tobacco. These products include cigarettes, chewing tobacco, and vaping devices, such as e-cigarettes. If you need help quitting, ask your health care provider. Sleep with the head of your bed raised. Use a wedge under the mattress or blocks under the bed frame to raise the head of the bed. Chew sugar-free gum after mealtimes. What foods should I eat? Eat a healthy, well-balanced diet of fruits, vegetables, whole grains, low-fat dairy products, lean meats, fish, and poultry. Each person is different. Foods that may trigger symptoms in one person may not trigger any symptoms in another  person. Work with your health care provider to identify foods that are safe for you. The items listed above may not be a complete list of recommended foods and beverages. Contact a dietitian for more information. What foods should I avoid? Limiting some of these foods may help manage the symptoms of GERD. Everyone is different. Consult a dietitian or your health care provider to help you identify the exact foods to avoid, if any. Fruits Any fruits prepared with added fat. Any fruits that cause symptoms. For some people this may include citrus fruits, such as oranges, grapefruit, pineapple, and lemons. Vegetables Deep-fried vegetables. [place] fries. Any vegetables prepared with added fat. Any vegetables that cause symptoms. For some people, this may include tomatoes and tomato products, chili peppers, onions and garlic, and horseradish. Grains Pastries or quick breads with added fat. Meats and other proteins High-fat meats, such as fatty beef or pork, hot dogs, ribs, ham, sausage, salami, and bacon. Fried meat or protein, including fried fish and fried chicken. Nuts and nut butters, in large amounts. Dairy Whole milk and chocolate milk. Sour cream. Cream. Ice cream. Cream cheese. Milkshakes. Fats and oils Butter. Margarine. Shortening. Ghee. Beverages Coffee and tea, with or without caffeine. Carbonated beverages. Sodas. Energy drinks. Fruit juice made with acidic fruits, such as orange or grapefruit. Tomato juice. Alcoholic drinks. Sweets and desserts Chocolate and cocoa. Donuts. Seasonings and condiments Pepper. Peppermint and spearmint. Added salt. Any condiments, herbs, or seasonings that cause symptoms. For some people, this may include curry, hot sauce, or vinegar-based salad dressings. The items listed above may not be a complete list of foods and beverages to avoid. Contact a dietitian for more information. Questions to ask your health care provider Diet and lifestyle changes are  usually the first steps that are taken to manage symptoms of GERD. If diet and lifestyle changes do not improve your symptoms, talk with your health care provider about taking medicines. Where to find more  Marketing executive for Gastrointestinal Disorders: aboutgerd.org Summary When you have gastroesophageal reflux disease (GERD), food and lifestyle choices may be very helpful in easing the discomfort of GERD. Eat frequent, small meals instead of three large meals each day. Eat your meals slowly, in a relaxed setting. Avoid bending over or lying down until 2-3 hours after eating. Limit high-fat foods such as fatty meats or fried foods. This information is not intended to replace advice given to you by your health care provider. Make sure you discuss any questions you have with your health care provider. Document Revised: 12/31/2019 Document Reviewed: 12/31/2019 Elsevier Patient Education  2022 Else[employer] Inc.  Gastroesophageal Reflux Disease, Adult Gastroesophageal reflux (GER) happens when acid from the stomach flows up into the tube that connects the mouth and the stomach (esophagus). Normally, food travels down the esophagus and stays in the stomach to be digested. However, when a person has GER, food and stomach acid sometimes move back up into the esophagus. If this becomes a more serious problem, the person may be diagnosed with a disease called gastroesophageal reflux disease (GERD). GERD occurs when the reflux: Happens often. Causes frequent or severe symptoms. Causes problems such as damage to the esophagus. When stomach acid comes in contact with the esophagus, the acid may cause inflammation in the esophagus. Over time, GERD may create small holes (ulcers) in the lining of the esophagus. What are the causes? This condition is caused by a problem with the muscle between the esophagus and the stomach (lower esophageal sphincter, or LES). Normally, the LES muscle closes  after food passes through the esophagus to the stomach. When the LES is weakened or abnormal, it does not close properly, and that allows food and stomach acid to go back up into the esophagus. The LES can be weakened by certain dietary substances, medicines, and medical conditions, including: Tobacco use. Pregnancy. Having a hiatal hernia. Alcohol use. Certain foods and beverages, such as coffee, chocolate, onions, and peppermint. What increases the risk? You are more likely to develop this condition if you: Have an increased body weight. Have a connective tissue disorder. Take NSAIDs, such as ibuprofen. What are the signs or symptoms? Symptoms of this condition include: Heartburn. Difficult or painful swallowing and the feeling of having a lump in the throat. A bitter taste in the mouth. Bad breath and having a large amount of saliva. Having an upset or bloated stomach and belching. Chest pain. Different conditions can cause chest pain. Make sure you see your health care provider if you experience chest pain. Shortness of breath or wheezing. Ongoing (chronic) cough or a nighttime cough. Wearing away of tooth enamel. Weight loss. How is this diagnosed? This condition may be diagnosed based on a medical history and a physical exam. To determine if you have mild or severe GERD, your health care provider may also monitor how you respond to treatment. You may also have tests, including: A test to examine your stomach and esophagus with a small camera (endoscopy). A test that measures the acidity level in your esophagus. A test that measures how much pressure is on your esophagus. A barium swallow or modified barium swallow test to show the shape, size, and functioning of your esophagus. How is this treated? Treatment for this condition may vary depending on how severe your symptoms are. Your health care provider may recommend: Changes to your diet. Medicine. Surgery. The goal of  treatment is to help relieve your symptoms and to prevent complications. Follow  these instructions at home: Eating and drinking  Follow a diet as recommended by your health care provider. This may involve avoiding foods and drinks such as: Coffee and tea, with or without caffeine. Drinks that contain alcohol. Energy drinks and sports drinks. Carbonated drinks or sodas. Chocolate and cocoa. Peppermint and mint flavorings. Garlic and onions. Horseradish. Spicy and acidic foods, including peppers, chili powder, curry powder, vinegar, hot sauces, and barbecue sauce. Citrus fruit juices and citrus fruits, such as oranges, lemons, and limes. Tomato-based foods, such as red sauce, chili, salsa, and pizza with red sauce. Fried and fatty foods, such as donuts, french fries, potato chips, and high-fat dressings. High-fat meats, such as hot dogs and fatty cuts of red and white meats, such as rib eye steak, sausage, ham, and bacon. High-fat dairy items, such as whole milk, butter, and cream cheese. Eat small, frequent meals instead of large meals. Avoid drinking large amounts of liquid with your meals. Avoid eating meals during the 2-3 hours before bedtime. Avoid lying down right after you eat. Do not exercise right after you eat. Lifestyle  Do not use any products that contain nicotine or tobacco. These products include cigarettes, chewing tobacco, and vaping devices, such as e-cigarettes. If you need help quitting, ask your health care provider. Try to reduce your stress by using methods such as yoga or meditation. If you need help reducing stress, ask your health care provider. If you are overweight, reduce your weight to an amount that is healthy for you. Ask your health care provider for guidance about a safe weight loss goal. General instructions Pay attention to any changes in your symptoms. Take over-the-counter and prescription medicines only as told by your health care provider. Do not  take aspirin, ibuprofen, or other NSAIDs unless your health care provider told you to take these medicines. Wear loose-fitting clothing. Do not wear anything tight around your waist that causes pressure on your abdomen. Raise (elevate) the head of your bed about 6 inches (15 cm). You can use a wedge to do this. Avoid bending over if this makes your symptoms worse. Keep all follow-up visits. This is important. Contact a health care provider if: You have: New symptoms. Unexplained weight loss. Difficulty swallowing or it hurts to swallow. Wheezing or a persistent cough. A hoarse voice. Your symptoms do not improve with treatment. Get help right away if: You have sudden pain in your arms, neck, jaw, teeth, or back. You suddenly feel sweaty, dizzy, or light-headed. You have chest pain or shortness of breath. You vomit and the vomit is green, yellow, or black, or it looks like blood or coffee grounds. You faint. You have stool that is red, bloody, or black. You cannot swallow, drink, or eat. These symptoms may represent a serious problem that is an emergency. Do not wait to see if the symptoms will go away. Get medical help right away. Call your local emergency services (911 in the [place] Do not drive yourself to the hospital. Summary Gastroesophageal reflux happens when acid from the stomach flows up into the esophagus. GERD is a disease in which the reflux happens often, causes frequent or severe symptoms, or causes problems such as damage to the esophagus. Treatment for this condition may vary depending on how severe your symptoms are. Your health care provider may recommend diet and lifestyle changes, medicine, or surgery. Contact a health care provider if you have new or worsening symptoms. Take over-the-counter and prescription medicines only as told by your  health care provider. Do not take aspirin, ibuprofen, or other NSAIDs unless your health care provider told you to do so. Keep all  follow-up visits as told by your health care provider. This is important. This information is not intended to replace advice given to you by your health care provider. Make sure you discuss any questions you have with your health care provider. Document Revised: 12/31/2019 Document Reviewed: 12/31/2019 Elsevier Patient Education  2022 [employer].

## 2021-05-15 NOTE — Telephone Encounter (Signed)
See result note I discussed with pt and wife rec [place] asap

## 2021-05-15 NOTE — Telephone Encounter (Signed)
John Keith called in from the Lab stating that there was a critically high result.  Troponin critical high at 34.

## 2021-05-15 NOTE — Telephone Encounter (Signed)
Pt was seen today and was advised to follow up in 3-6 months. When proceeding to schedule, pt exited the building. Pt appt was not made!

## 2021-05-15 NOTE — Progress Notes (Signed)
ekg 

## 2021-05-15 NOTE — Telephone Encounter (Signed)
Letter mailed for Patient to call in and schedule

## 2021-05-15 NOTE — Progress Notes (Signed)
Chief Complaint  Patient presents with   Follow-up   Chest Pain    On and off since having COVID-19 in February of 2021.    Numbness    In the right hand when waking up in the morning.    H/o  1. Chest pain on and off since having covid 07/07/19 chest pain at rest center of chest burning w/o radiation as of 05/15/21 quit smoking   2. Right hand numbness  H/o CTS s/p surgery left    Review of Systems  Constitutional:  Negative for weight loss.  HENT:  Negative for hearing loss.   Eyes:  Negative for blurred vision.  Respiratory:  Negative for shortness of breath.   Cardiovascular:  Positive for chest pain.  Gastrointestinal:  Negative for abdominal pain and blood in stool.  Musculoskeletal:  Negative for back pain.  Skin:  Negative for rash.  Neurological:  Positive for sensory change. Negative for headaches.  Psychiatric/Behavioral:  Negative for depression.   Past Medical History:  Diagnosis Date   Allergy    Arthritis    Carpal tunnel syndrome on left    Chest pain of uncertain etiology 78/93/8101   COVID-19    07/07/19   CTS (carpal tunnel syndrome)    left   DDD (degenerative disc disease), thoracolumbar    unknown location    DDD (degenerative disc disease), thoracolumbar    multilevel   Degenerative joint disease of left shoulder    Degenerative joint disease of left shoulder    02/2011    Diverticulosis    left colon (2008)    ED (erectile dysfunction)    02/2011    ED (erectile dysfunction)    GERD (gastroesophageal reflux disease)    H. pylori infection    H. pylori infection    Hyperlipidemia    Hypertension    Insomnia    Lactose intolerance    Lactose intolerance    Low back pain    PVC (premature ventricular contraction) 10/24/2019   Shingles    07/2018   Smoking    smoking since age 52 y.o   Toe fracture, left    4th in 2014   Vitamin D deficiency    Vitamin D deficiency    Past Surgical History:  Procedure Laterality Date   APPENDECTOMY      BACK SURGERY     x 2 [address] and 1995 in [place]   CARPAL TUNNEL RELEASE Left    COLONOSCOPY     2008 diverticulosis   ESOPHAGOGASTRODUODENOSCOPY     h/o +h. pylori   OTHER SURGICAL HISTORY     heart catherization 1999 normal    SPINE SURGERY     x 2    Family History  Problem Relation Age of Onset   Heart disease Mother        age 55    Throat cancer Father 63   Cancer Father        throat    Cancer Maternal Uncle        uncle ?maternal or paternal died colon cancer in his 11s    Diabetes Maternal Aunt    Heart attack Maternal Grandmother    Social History   Socioeconomic History   Marital status: Married    Spouse name: Not on file   Number of children: Not on file   Years of education: Not on file   Highest education level: Not  on file  Occupational History   Not on file  Tobacco Use   Smoking status: Former    Packs/day: 0.50    Types: Cigarettes    Start date: 07/06/1971   Smokeless tobacco: Never   Tobacco comments:    06/26/20 quit date  Substance and Sexual Activity   Alcohol use: No    Alcohol/week: 0.0 standard drinks   Drug use: No   Sexual activity: Not on file  Other Topics Concern   Not on file  Social History Narrative   Married    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    On child    1 son, 1 daughter   Machine op   Owns guns    Wears seat belt, safe in relationship    Smoker    Retired 06/27/2019   Social Determinants of Radio broadcast assistant Strain: Low Risk    Difficulty of Paying Living Expenses: Not hard at all  Food Insecurity: No Food Insecurity   Worried About Charity fundraiser in the Last Year: Never true   Arboriculturist in the Last Year: Never true  Transportation Needs: No Transportation Needs   Lack of Transportation (Medical): No   Lack of Transportation (Non-Medical): No  Physical Activity: Sufficiently Active   Days of Exercise per Week: 5 days   Minutes of Exercise per Session: 30 min   Stress: No Stress Concern Present   Feeling of Stress : Not at all  Social Connections: Unknown   Frequency of Communication with Friends and Family: More than three times a week   Frequency of Social Gatherings with Friends and Family: More than three times a week   Attends Religious Services: Not on Electrical engineer or Organizations: Yes   Attends Archivist Meetings: Not on file   Marital Status: Married  Human resources officer Violence: Not At Risk   Fear of Current or Ex-Partner: No   Emotionally Abused: No   Physically Abused: No   Sexually Abused: No   Current Meds  Medication Sig   betamethasone valerate (VALISONE) 0.1 % cream Apply topically 2 (two) times daily. Prn too strong face, underarms, groin   Cholecalciferol (VITAMIN D-3) 125 MCG (5000 UT) TABS Take by mouth.   ferrous sulfate 325 (65 FE) MG tablet Take 325 mg by mouth daily with breakfast.   gabapentin (NEURONTIN) 300 MG capsule Take 3 capsules (900 mg total) by mouth at bedtime.   Multiple Vitamin (MULTIVITAMIN) tablet Take 1 tablet by mouth daily.   mupirocin ointment (BACTROBAN) 2 % Apply 1 application topically 2 (two) times daily. To nose x 1-2 weeks   [DISCONTINUED] amLODipine (NORVASC) 5 MG tablet Take 1 tablet (5 mg total) by mouth daily. Goal BP less than 130 (top)/less than 80 (bottom)   [DISCONTINUED] ezetimibe (ZETIA) 10 MG tablet Take 1 tablet (10 mg total) by mouth daily.   [DISCONTINUED] losartan-hydrochlorothiazide (HYZAAR) 100-12.5 MG tablet Take 1 tablet by mouth daily. In am   Allergies  Allergen Reactions   Crestor [Rosuvastatin Calcium]     Elevated muscle enzymes    Lipitor [Atorvastatin Calcium]     Elevated muscle enzymes    Tomato Itching   Recent Results (from the past 2160 hour(s))  HgB A1c     Status: None   Collection Time: 02/26/21  4:29 PM  Result Value Ref Range   Hgb A1c MFr Bld 5.6 4.6 - 6.5 %    Comment: Glycemic Control  Guidelines for People with  Diabetes:Non Diabetic:  <6%Goal of Therapy: <7%Additional Action Suggested:  >8%   B12     Status: None   Collection Time: 02/26/21  4:29 PM  Result Value Ref Range   Vitamin B-12 486 211 - 911 pg/mL   Objective  Body mass index is 27.13 kg/m. Wt Readings from Last 3 Encounters:  05/15/21 253 lb 3.2 oz (114.9 kg)  02/26/21 254 lb (115.2 kg)  01/20/21 252 lb 12.8 oz (114.7 kg)   Temp Readings from Last 3 Encounters:  05/15/21 (!) 97 F (36.1 C) (Temporal)  02/26/21 98.5 F (36.9 C) (Oral)  10/23/20 (!) 97.3 F (36.3 C)   BP Readings from Last 3 Encounters:  05/15/21 130/78  02/26/21 130/80  01/20/21 136/82   Pulse Readings from Last 3 Encounters:  05/15/21 84  02/26/21 62  01/20/21 85    Physical Exam Vitals and nursing note reviewed.  Constitutional:      Appearance: Normal appearance. He is well-developed and well-groomed. He is obese.  HENT:     Head: Normocephalic and atraumatic.  Eyes:     Conjunctiva/sclera: Conjunctivae normal.     Pupils: Pupils are equal, round, and reactive to light.  Cardiovascular:     Rate and Rhythm: Normal rate and regular rhythm.     Heart sounds: Normal heart sounds.  Pulmonary:     Effort: Pulmonary effort is normal. No respiratory distress.     Breath sounds: Normal breath sounds.  Abdominal:     Tenderness: There is no abdominal tenderness.  Musculoskeletal:     Lumbar back: Tenderness present. Negative right straight leg raise test and negative left straight leg raise test.  Skin:    General: Skin is warm and moist.  Neurological:     General: No focal deficit present.     Mental Status: He is alert and oriented to person, place, and time. Mental status is at baseline.     Sensory: Sensation is intact.     Motor: Motor function is intact.     Coordination: Coordination is intact.     Gait: Gait is intact. Gait normal.  Psychiatric:        Attention and Perception: Attention and perception normal.        Mood and  Affect: Mood and affect normal.        Speech: Speech normal.        Behavior: Behavior normal. Behavior is cooperative.        Thought Content: Thought content normal.        Cognition and Memory: Cognition and memory normal.        Judgment: Judgment normal.    Assessment  Plan  Chest pain, unspecified type r/o cardiac vs gerd rec take protonix 40 mg - Plan: EKG 12-Lead, Troponin I (High Sensitivity), CT Chest Wo Contrast,    Essential hypertension controlled - Plan: losartan-hydrochlorothiazide (HYZAAR) 100-12.5 MG tablet, amLODipine (NORVASC) 5 MG tablet, Comprehensive metabolic panel, CBC with Differential/Platelet, Lipid panel  Hyperlipidemia, unspecified hyperlipidemia type - Plan: ezetimibe (ZETIA) 10 MG tablet   Cervicalgia - Plan: DG Cervical Spine Complete,  Declines emg/ncs  Cigarette nicotine dependence without complication - Plan: CT Chest Wo Contrast  Nasal sore - Plan: mupirocin ointment (BACTROBAN) 2 %  Eczema, unspecified type - Plan: betamethasone valerate (VALISONE) 0.1 % cream   HM Flu shot utd prevnar and pna 23 utd 06/03/16 and 02/16/15  Tdap had 06/15/13  shingrix sent to pharmacy h/o shingles had  1/2 check on 2nd dose  covid vx 3/3 pfizer disc 4th dose    Hep B immune  PSA 1.28 12/13/19 -need to check in futur e Hep C neg 06/03/16  D/c MMR check 2/2 cost  a1c 5.3 06/11/19    Colonoscopy 12/17/16 Dr. Moshe Salisbury  -had 12/17/16  EGD 11/15/19 prob healing ulcer in duodenum, mild deformity antrum likely from old ulcer disease bx negative, hiatal hernia Dr. Lyda Jester    Tobacco abuse long term-rec smoking cessation 1/2 ppd since age 49 y.o max 1 ppd quit 06/26/20 ? pulm referral as of 07/10/20 in the future consider pulm referral   Provider: Dr. Olivia Mackie [name]-Internal Medicine

## 2021-05-16 ENCOUNTER — Encounter (HOSPITAL_COMMUNITY): Payer: Self-pay | Admitting: Emergency Medicine

## 2021-05-16 ENCOUNTER — Emergency Department (HOSPITAL_COMMUNITY): Payer: PPO

## 2021-05-16 ENCOUNTER — Emergency Department (HOSPITAL_COMMUNITY)
Admission: EM | Admit: 2021-05-16 | Discharge: 2021-05-16 | Disposition: A | Discharge status: Home / Self Care | Payer: PPO | Attending: Emergency Medicine | Admitting: Emergency Medicine

## 2021-05-16 ENCOUNTER — Other Ambulatory Visit: Payer: Self-pay

## 2021-05-16 DIAGNOSIS — R072 Precordial pain: Secondary | ICD-10-CM | POA: Diagnosis not present

## 2021-05-16 DIAGNOSIS — R079 Chest pain, unspecified: Secondary | ICD-10-CM

## 2021-05-16 DIAGNOSIS — Z7982 Long term (current) use of aspirin: Secondary | ICD-10-CM | POA: Diagnosis not present

## 2021-05-16 DIAGNOSIS — Z8616 Personal history of COVID-19: Secondary | ICD-10-CM | POA: Diagnosis not present

## 2021-05-16 DIAGNOSIS — Z87891 Personal history of nicotine dependence: Secondary | ICD-10-CM | POA: Insufficient documentation

## 2021-05-16 DIAGNOSIS — R0789 Other chest pain: Secondary | ICD-10-CM | POA: Diagnosis not present

## 2021-05-16 DIAGNOSIS — I10 Essential (primary) hypertension: Secondary | ICD-10-CM | POA: Insufficient documentation

## 2021-05-16 DIAGNOSIS — R030 Elevated blood-pressure reading, without diagnosis of hypertension: Secondary | ICD-10-CM

## 2021-05-16 DIAGNOSIS — Z79899 Other long term (current) drug therapy: Secondary | ICD-10-CM | POA: Insufficient documentation

## 2021-05-16 LAB — BASIC METABOLIC PANEL
Anion gap: 9 (ref 5–15)
BUN: 14 mg/dL (ref 8–23)
CO2: 26 mmol/L (ref 22–32)
Calcium: 9.3 mg/dL (ref 8.9–10.3)
Chloride: 101 mmol/L (ref 98–111)
Creatinine, Ser: 1.22 mg/dL (ref 0.61–1.24)
GFR, Estimated: >60 mL/min (ref >60)
Glucose, Bld: 148 mg/dL — ABNORMAL HIGH (ref 70–99)
Potassium: 3.4 mmol/L — ABNORMAL LOW (ref 3.5–5.1)
Sodium: 136 mmol/L (ref 135–145)

## 2021-05-16 LAB — TROPONIN I (HIGH SENSITIVITY)
Troponin I (High Sensitivity): 33 ng/L — ABNORMAL HIGH (ref <18)
Troponin I (High Sensitivity): 33 ng/L — ABNORMAL HIGH (ref <18)

## 2021-05-16 LAB — CBC
HCT: 40.8 % (ref 39.0–52.0)
Hemoglobin: 12.7 g/dL — ABNORMAL LOW (ref 13.0–17.0)
MCH: 30.6 pg (ref 26.0–34.0)
MCHC: 31.1 g/dL (ref 30.0–36.0)
MCV: 98.3 fL (ref 80.0–100.0)
Platelets: 299 10*3/uL (ref 150–400)
RBC: 4.15 MIL/uL — ABNORMAL LOW (ref 4.22–5.81)
RDW: 12.8 % (ref 11.5–15.5)
WBC: 5.6 10*3/uL (ref 4.0–10.5)
nRBC: 0 % (ref 0.0–0.2)

## 2021-05-16 MED ORDER — ALUM & MAG HYDROXIDE-SIMETH 200-200-20 MG/5ML PO SUSP
15.0000 mL | Freq: Once | ORAL | Status: AC
  Administered 2021-05-16: 15 mL via ORAL
  Filled 2021-05-16: qty 30

## 2021-05-16 MED ORDER — FAMOTIDINE IN NACL 20-0.9 MG/50ML-% IV SOLN
20.0000 mg | Freq: Once | INTRAVENOUS | Status: AC
  Administered 2021-05-16: 20 mg via INTRAVENOUS
  Filled 2021-05-16: qty 50

## 2021-05-16 MED ORDER — ASPIRIN 81 MG PO CHEW: 81.0000 mg | CHEWABLE_TABLET | Freq: Every day | ORAL | 0 refills | Status: AC

## 2021-05-16 MED ORDER — ASPIRIN 325 MG PO TABS
325.0000 mg | ORAL_TABLET | Freq: Every day | ORAL | Status: DC
  Administered 2021-05-16: 325 mg via ORAL
  Filled 2021-05-16 (×2): qty 1

## 2021-05-16 NOTE — ED Triage Notes (Addendum)
Pt reports intermittent pain to center of chest with exertional SOB x 1 month.  Seen at PCP office yesterday and received a call yesterday afternoon to come to ED for admission due to Trop 34.  Pain 1/10 at this time.  Denies nausea and vomiting.

## 2021-05-16 NOTE — ED Provider Notes (Signed)
[facility] EMERGENCY DEPARTMENT Provider Note   CSN: [id number] Arrival date & time: 05/16/21  [id number]     History Chief Complaint  Patient presents with  . Chest Pain    John Keith is a 70 y.o. male.  This is a 70 y.o. male with significant medical history as below, including HTN, tobacco use who presents to the ED with complaint of cp, elevated troponin  Location:  mid sternal Duration:  >1 mos Onset:  gradual Timing:  intermittent Description:  sharp, throbbing pain, sometimes aching Severity:  mild Exacerbating/Alleviating Factors:  none identified Associated Symptoms:  none identified  Pertinent Negatives:  no fevers, chills, IVDU, trauma, HA, vision changes, neurologic changes, dyspnea, leg swelling, recent travel or sick contacts  Context: pt with intermittent chest pain since covid19 infection, not acutely worsened. O/p troponin was obtained at [facility] office, mildly elevated at 34. Upon arrival to the ED he is asymptomatic.    The history is provided by the patient. No language interpreter was used.  Chest Pain Associated symptoms: no abdominal pain, no cough, no dysphagia, no fever, no headache, no nausea, no palpitations, no shortness of breath and no vomiting       Past Medical History:  Diagnosis Date  . Allergy   . Arthritis   . Carpal tunnel syndrome on left   . Chest pain of uncertain etiology 99991111  . COVID-19    07/07/19  . CTS (carpal tunnel syndrome)    left  . DDD (degenerative disc disease), thoracolumbar    unknown location   . DDD (degenerative disc disease), thoracolumbar    multilevel  . Degenerative joint disease of left shoulder   . Degenerative joint disease of left shoulder    02/2011   . Diverticulosis    left colon (2008)   . ED (erectile dysfunction)    02/2011   . ED (erectile dysfunction)   . GERD (gastroesophageal reflux disease)   . H. pylori infection   . H. pylori infection   . Hyperlipidemia   .  Hypertension   . Insomnia   . Lactose intolerance   . Lactose intolerance   . Low back pain   . PVC (premature ventricular contraction) 10/24/2019  . Shingles    07/2018  . Smoking    smoking since age 68 y.o  . Toe fracture, left    4th in 2014  . Vitamin D deficiency   . Vitamin D deficiency     Patient Active Problem List   Diagnosis Date Noted  . Abnormal MRI, lumbar spine 10/23/2020  . Chronic low back pain 10/23/2020  . Neuropathy 10/23/2020  . Coronary artery disease of native artery of native heart with stable angina pectoris ([place]) 10/23/2020  . Impingement syndrome of left shoulder region 08/22/2020  . Pain in joint of left shoulder 07/24/2020  . Chronic left shoulder pain 07/11/2020  . 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  . Anemia 12/13/2019  . Chest pain of uncertain etiology 99991111  . PVC (premature ventricular contraction) 10/24/2019  . Gastric ulcer without hemorrhage or perforation 09/28/2019  . COVID-19 virus detected 07/12/2019  . Elevated troponin 07/07/2019  . Elevated serum protein level 07/07/2019  . Postherpetic neuralgia 08/17/2018  . Benign prostatic hyperplasia 08/01/2018  . Hip pain 06/29/2018  . Aortic atherosclerosis ([place]) 09/22/2016  . Fatigue 06/03/2016  . Lower urinary tract symptoms (LUTS) 06/03/2016  . Constipation 03/10/2016  . Lumbar radiculopathy 02/20/2016  . Encounter for immunization 11/26/2015  . Insomnia  06/10/2015  . External hemorrhoid 03/07/2015  . Health care maintenance 01/09/2014  . Carpal tunnel syndrome of left wrist 01/03/2013  . Hyperlipidemia 11/09/2012  . Vitamin D deficiency 11/09/2012  . Essential hypertension 11/06/2012  . Tobacco abuse 11/06/2012  . Seasonal allergies 11/06/2012  . Erectile dysfunction 11/06/2012  . GERD (gastroesophageal reflux disease) 11/06/2012  . DDD (degenerative disc disease), thoracolumbar 11/06/2012  . Lactose intolerance 11/06/2012  . Diverticulosis of colon without hemorrhage  11/06/2012    Past Surgical History:  Procedure Laterality Date  . APPENDECTOMY    . BACK SURGERY     x 2 10991 and 1995 in [place]   . CARDIAC CATHETERIZATION     1999 nl  . CARPAL TUNNEL RELEASE Left   . COLONOSCOPY     2008 diverticulosis  . ESOPHAGOGASTRODUODENOSCOPY     h/o +h. pylori  . OTHER SURGICAL HISTORY     heart catherization 1999 normal   . SPINE SURGERY     x 2        Family History  Problem Relation Age of Onset  . Heart disease Mother        age 9   . Throat cancer Father 33  . Cancer Father        throat   . Cancer Maternal Uncle        uncle ?maternal or paternal died colon cancer in his 34s   . Diabetes Maternal Aunt   . Heart attack Maternal Grandmother     Social History   Tobacco Use  . Smoking status: Former    Packs/day: 0.50    Types: Cigarettes    Start date: 07/06/1971  . Smokeless tobacco: Never  . Tobacco comments:    06/26/20 quit date  Substance Use Topics  . Alcohol use: No    Alcohol/week: 0.0 standard drinks  . Drug use: No    Home Medications Prior to Admission medications   Medication Sig Start Date End Date Taking? Authorizing Provider  amLODipine (NORVASC) 5 MG tablet Take 1 tablet (5 mg total) by mouth daily. Goal BP less than 130 (top)/less than 80 (bottom) 05/15/21  Yes [name], Pasty Spillers, MD  aspirin 81 MG chewable tablet Chew 1 tablet (81 mg total) by mouth daily. Take with food 05/16/21 06/15/21 Yes Tanda Rockers A, DO  Cholecalciferol (VITAMIN D-3) 125 MCG (5000 UT) TABS Take 1 tablet by mouth daily.   Yes [provider]  ezetimibe (ZETIA) 10 MG tablet Take 1 tablet (10 mg total) by mouth daily. 05/15/21  Yes [name], Pasty Spillers, MD  ferrous sulfate 325 (65 FE) MG tablet Take 325 mg by mouth daily with breakfast.   Yes [provider]  gabapentin (NEURONTIN) 300 MG capsule Take 3 capsules (900 mg total) by mouth at bedtime. 02/26/21  Yes Glori Luis, MD   losartan-hydrochlorothiazide (HYZAAR) 100-12.5 MG tablet Take 1 tablet by mouth daily. In am 05/15/21  Yes [name], Pasty Spillers, MD  Multiple Vitamin (MULTIVITAMIN) tablet Take 1 tablet by mouth daily.   Yes [provider]  mupirocin ointment (BACTROBAN) 2 % Apply 1 application topically 2 (two) times daily. To nose x 1-2 weeks 05/15/21  Yes [name], Pasty Spillers, MD  pantoprazole (PROTONIX) 40 MG tablet Take 1 tablet (40 mg total) by mouth daily. 30 min before food 05/15/21  Yes [name], Pasty Spillers, MD  betamethasone valerate (VALISONE) 0.1 % cream Apply topically 2 (two) times daily. Prn too strong face, underarms, groin Patient not taking: Reported  on 05/16/2021 05/15/21   [name], [name], MD  sulfamethoxazole-trimethoprim (BACTRIM DS) 800-160 MG tablet Take 1 tablet by mouth 2 (two) times daily. Patient not taking: No sig reported 01/20/21   Dutch Quint B, FNP  triamcinolone cream (KENALOG) 0.1 % Apply 1 application topically 2 (two) times daily as needed. Back itching Patient not taking: No sig reported 12/13/19   [name], [name], MD    Allergies    Crestor [rosuvastatin calcium], Lipitor [atorvastatin calcium], and Tomato  Review of Systems   Review of Systems  Constitutional:  Negative for chills and fever.  HENT:  Negative for facial swelling and trouble swallowing.   Eyes:  Negative for photophobia and visual disturbance.  Respiratory:  Negative for cough and shortness of breath.   Cardiovascular:  Positive for chest pain. Negative for palpitations.  Gastrointestinal:  Negative for abdominal pain, nausea and vomiting.  Endocrine: Negative for polydipsia and polyuria.  Genitourinary:  Negative for difficulty urinating and hematuria.  Musculoskeletal:  Negative for gait problem and joint swelling.  Skin:  Negative for pallor and rash.  Neurological:  Negative for syncope and headaches.  Psychiatric/Behavioral:  Negative for agitation and  confusion.    Physical Exam Updated Vital Signs BP (!) 165/102   Pulse (!) 58   Temp 98 F (36.7 C) (Oral)   Resp 10   SpO2 98%   Physical Exam Vitals and nursing note reviewed.  Constitutional:      General: He is not in acute distress.    Appearance: He is well-developed.  HENT:     Head: Normocephalic and atraumatic.     Right Ear: External ear normal.     Left Ear: External ear normal.     Mouth/Throat:     Mouth: Mucous membranes are moist.  Eyes:     General: No scleral icterus. Cardiovascular:     Rate and Rhythm: Normal rate and regular rhythm.     Pulses: Normal pulses.     Heart sounds: Normal heart sounds.  Pulmonary:     Effort: Pulmonary effort is normal. No respiratory distress.     Breath sounds: Normal breath sounds.  Abdominal:     General: Abdomen is flat.     Palpations: Abdomen is soft.     Tenderness: There is no abdominal tenderness.  Musculoskeletal:        General: Normal range of motion.     Cervical back: Normal range of motion.     Right lower leg: No edema.     Left lower leg: No edema.  Skin:    General: Skin is warm and dry.     Capillary Refill: Capillary refill takes less than 2 seconds.  Neurological:     Mental Status: He is alert and oriented to person, place, and time.  Psychiatric:        Mood and Affect: Mood normal.        Behavior: Behavior normal.    ED Results / Procedures / Treatments   Labs (all labs ordered are listed, but only abnormal results are displayed) Labs Reviewed  BASIC METABOLIC PANEL - Abnormal; Notable for the following components:      Result Value   Potassium 3.4 (*)    Glucose, Bld 148 (*)    All other components within normal limits  CBC - Abnormal; Notable for the following components:   RBC 4.15 (*)    Hemoglobin 12.7 (*)    All other components within normal limits  TROPONIN  I (HIGH SENSITIVITY) - Abnormal; Notable for the following components:   Troponin I (High Sensitivity) 33 (*)     All other components within normal limits  TROPONIN I (HIGH SENSITIVITY) - Abnormal; Notable for the following components:   Troponin I (High Sensitivity) 33 (*)    All other components within normal limits    EKG EKG Interpretation  Date/Time:  Saturday May 16 2021 11:23:54 EST Ventricular Rate:  64 PR Interval:  194 QRS Duration: 109 QT Interval:  407 QTC Calculation: 420 R Axis:   43 Text Interpretation: Sinus rhythm Borderline repolarization abnormality No STEMI Confirmed by Wynona Dove ([id number]) on 05/16/2021 12:37:16 PM  Radiology DG Chest 2 View  Result Date: 05/16/2021 CLINICAL DATA:  Chest pain EXAM: CHEST - 2 VIEW COMPARISON:  07/07/2019 FINDINGS: The heart size and mediastinal contours are within normal limits. Both lungs are clear. The visualized skeletal structures are unremarkable. IMPRESSION: No active cardiopulmonary disease. Electronically Signed   By: Jorje Guild M.D.   On: 05/16/2021 08:23    Procedures Procedures   Medications Ordered in ED Medications  aspirin tablet 325 mg (325 mg Oral Given 05/16/21 1126)  famotidine (PEPCID) IVPB 20 mg premix (0 mg Intravenous Stopped 05/16/21 1333)  alum & mag hydroxide-simeth (MAALOX/MYLANTA) 200-200-20 MG/5ML suspension 15 mL (15 mLs Oral Given 05/16/21 1247)    ED Course  I have reviewed the triage vital signs and the nursing notes.  Pertinent labs & imaging results that were available during my care of the patient were reviewed by me and considered in my medical decision making (see chart for details).  Clinical Course as of 05/16/21 1556  Sat May 16, 2021  1305 Coronary CT last year, stable [SG]    Clinical Course User Index [SG] Jeanell Sparrow, DO   MDM Rules/Calculators/A&P                           CC: cp  This patient complains of above; this involves an extensive number of treatment options and is a complaint that carries with it a high risk of complications and morbidity. Vital signs were  reviewed. Serious etiologies considered.   Record review:  Previous records obtained and reviewed. Prior Coronary CT one year ago, stable- pt also with troponin 09/28/19 that was also 34, his seems to have chronically elevated troponin in the past. Ranging into the 50's.    Work up as above, notable for:  Labs & imaging results that were available during my care of the patient were reviewed by me and considered in my medical decision making.   I ordered imaging studies which included CXR and I independently visualized and interpreted imaging which showed stable  EKG with some chronic changes, no acute ischemia, no STEMI.  Management: Given GI cocktail per pt request, ASA  Reassessment:  Pt remains asymptomatic. D/w cardiology on call, reviewed history. Troponin similar to his baseline. Recent coronary T which was stable. Asymptomatic currently, stable EKG. Agree with plan for discharge home and close o/p f/u with Dr Oval Linsey cardio. Pt is agreeable.   The patient improved significantly and was discharged in stable condition. Detailed discussions were had with the patient regarding current findings, and need for close f/u with PCP or on call doctor. The patient has been instructed to return immediately if the symptoms worsen in any way for re-evaluation. Patient verbalized understanding and is in agreement with current care plan. All questions answered prior to  discharge.         This chart was dictated using voice recognition software.  Despite best efforts to proofread,  errors can occur which can change the documentation meaning.  Final Clinical Impression(s) / ED Diagnoses Final diagnoses:  Elevated blood pressure reading  Chest pain, unspecified type    Rx / DC Orders ED Discharge Orders          Ordered    aspirin 81 MG chewable tablet  Daily        05/16/21 1317             Jeanell Sparrow, DO 05/16/21 1556

## 2021-05-16 NOTE — Discharge Instructions (Signed)
Please follow up with your cardiologist early next week.

## 2021-05-17 ENCOUNTER — Telehealth (HOSPITAL_BASED_OUTPATIENT_CLINIC_OR_DEPARTMENT_OTHER): Payer: Self-pay | Admitting: *Deleted

## 2021-05-17 NOTE — Telephone Encounter (Signed)
Patient needs post ED visit  Scheduled with Ronn Melena NP 11/16

## 2021-05-18 ENCOUNTER — Telehealth: Payer: Self-pay | Admitting: Internal Medicine

## 2021-05-18 NOTE — Telephone Encounter (Signed)
Not able to leave vm

## 2021-05-18 NOTE — Telephone Encounter (Signed)
Noted, see result note  

## 2021-05-18 NOTE — Telephone Encounter (Signed)
Spoke with patient and gave appointment date, time, and location

## 2021-05-19 NOTE — Progress Notes (Signed)
Office Visit    Patient Name: John Keith Date of Encounter: 05/20/2021  PCP:  [name], Pasty Spillers, MD   [facility]  Cardiologist:  Chilton Si, MD  Advanced Practice Provider:  No care team member to display Electrophysiologist:  None    Chief Complaint    John Keith is a 70 y.o. male with a hx of of HTN, HLD, tobacco abuse, COVID-19 infection in early 2021, history of gastric ulcer/GI bleed and aortic atherosclerosis presents today for a post ED visit.   Past Medical History    Past Medical History:  Diagnosis Date   Allergy    Arthritis    Carpal tunnel syndrome on left    Chest pain of uncertain etiology 10/24/2019   COVID-19    07/07/19   CTS (carpal tunnel syndrome)    left   DDD (degenerative disc disease), thoracolumbar    unknown location    DDD (degenerative disc disease), thoracolumbar    multilevel   Degenerative joint disease of left shoulder    Degenerative joint disease of left shoulder    02/2011    Diverticulosis    left colon (2008)    ED (erectile dysfunction)    02/2011    ED (erectile dysfunction)    GERD (gastroesophageal reflux disease)    H. pylori infection    H. pylori infection    Hyperlipidemia    Hypertension    Insomnia    Lactose intolerance    Lactose intolerance    Low back pain    PVC (premature ventricular contraction) 10/24/2019   Shingles    07/2018   Smoking    smoking since age 63 y.o   Toe fracture, left    4th in 2014   Vitamin D deficiency    Vitamin D deficiency    Past Surgical History:  Procedure Laterality Date   APPENDECTOMY     BACK SURGERY     x 2 10991 and 1995 in [place]    CARDIAC CATHETERIZATION     1999 nl   CARPAL TUNNEL RELEASE Left    COLONOSCOPY     2008 diverticulosis   ESOPHAGOGASTRODUODENOSCOPY     h/o +h. pylori   OTHER SURGICAL HISTORY     heart catherization 1999 normal    SPINE SURGERY     x 2     Allergies  Allergies  Allergen  Reactions   Crestor [Rosuvastatin Calcium] Other (See Comments)    Elevated muscle enzymes    Lipitor [Atorvastatin Calcium] Other (See Comments)    Elevated muscle enzymes    Tomato Itching    History of Present Illness    John Keith is a 70 y.o. male with a hx of of HTN, HLD, tobacco abuse, COVID-19 infection in early 2021, history of gastric ulcer/GI bleed and aortic atherosclerosis last seen 12/29/2020 by Azalee Course, PA.   Patient was previously seen by Dr. Duke Salvia in April 2021 for hypertension and elevated troponin. Patient was seen in the [facility] ED multiple times for blood pressure in the 160s. However it was felt that his wrist blood pressure cuff was not accurate. In the emergency room, his blood pressure was actually in the 110s. Troponin in the ED was elevated at 0.042. He also had a gastric pain, however this was in the setting of gastric ulcer. He felt better after being placed on Protonix by his PCP. Given his significant family history of CAD, tobacco abuse and  hypertension, Dr. Duke Salvia ordered a coronary CT to help guide the treatment. Coronary CT performed on 10/25/2019 showed a coronary calcium score of 23.1 which placed the patient in the 48th percentile for age and sex matched control, mild calcification in the proximal RCA, proximal and mid LAD, all of which are less than 25%, medical management was recommended.  When he presented in June  2022 to the clinic his blood pressure was well controlled at that time.  The lab work obtained in January 2022 showed stable renal function and stable electrolytes.  He was on Zetia 10 mg daily.  Lipid panel from June 2021 showed borderline LDL of 99.  At that time he was still having intermittent chest pain, however his chest discomfort only occurred when he would wake up in the morning.  He did have a habit of eating right before bed.  It was question whether this was more acid reflux versus cardiac.  His chest pain did not occur  with exertion.  Given that he had mild coronary artery disease that was seen on coronary CT in April 2021 it was recommended for him to continue observation at this time and let him know if his chest pain becomes more associated with physical activity.    On 05/16/2021 the patient went to the ED for chest pain.  He said he was having intermittent chest pain since his COVID-19 infection which had not acutely worsened.  Troponin was mildly elevated at 34.  Upon arrival to the ED he was asymptomatic.  His EKG had some chronic changes, no acute ischemia, and no STEMI.  He was given a GI cocktail per patient request and aspirin.  He remained asymptomatic and follow-up with cardiology was recommended.  Since recent coronary CT was stable he was sent home at this time.  Today, he is overall feeling well. He does still have some chest pain which he describes as burning in his chest and up his esophagus. He does not experience pain while walking and does do a fair amount of activity. He notices the pain at night after he eats a chocolate bar before bed and after eating. This does not sounds cardiac but more GERD related. He was started on Protonix by his PCP on Friday and has been tolerating it well. He was advised to take Tums or Rolaids if his pain is severe. We discussed statins and he states that he just didn't feel well when he took them in the past. He is willing to try pravastatin 20mg  three times a week and see how he feels. We will re-draw a lipid panel in 6 weeks and follow-up in 8 week. Goal is < 70 LDL in CAD patients.   Reports no shortness of breath nor dyspnea on exertion. No pedal edema or orthopnea, PND. Reports no palpitations.       EKGs/Labs/Other Studies Reviewed:   The following studies were reviewed today:  Coronary CT done on 10/25/2019  IMPRESSION: 1. Coronary calcium score of 23.1. This was 48th percentile for age and sex matched control.   2. Normal coronary origin with right  dominance.   3. Mild calcifications in the proximal RCA, proximal and mid LAD causing minimal stenosis.   4. CAD-RADS 1. Minimal non-obstructive CAD (0-24%). Consider non-atherosclerotic causes of chest pain. Consider preventive therapy and risk factor modification.   Electronically Signed: By: Debbe Odea M.D. On: 10/25/2019 16:45       Electronically Signed   By: Loura Halt.D.  On: 10/26/2019 08:19  EKG:  none ordered today.   Recent Labs: 05/15/2021: ALT 25; TSH 0.88 05/16/2021: BUN 14; Creatinine, Ser 1.22; Hemoglobin 12.7; Platelets 299; Potassium 3.4; Sodium 136  Recent Lipid Panel    Component Value Date/Time   CHOL 176 05/15/2021 1403   TRIG 204.0 (H) 05/15/2021 1403   HDL 45.80 05/15/2021 1403   CHOLHDL 4 05/15/2021 1403   VLDL 40.8 (H) 05/15/2021 1403   LDLCALC 99 12/13/2019 0835   LDLDIRECT 108.0 05/15/2021 1403     Home Medications   Current Meds  Medication Sig   amLODipine (NORVASC) 5 MG tablet Take 1 tablet (5 mg total) by mouth daily. Goal BP less than 130 (top)/less than 80 (bottom)   aspirin 81 MG chewable tablet Chew 1 tablet (81 mg total) by mouth daily. Take with food   betamethasone valerate (VALISONE) 0.1 % cream Apply topically 2 (two) times daily. Prn too strong face, underarms, groin   Cholecalciferol (VITAMIN D-3) 125 MCG (5000 UT) TABS Take 1 tablet by mouth daily.   ezetimibe (ZETIA) 10 MG tablet Take 1 tablet (10 mg total) by mouth daily.   ferrous sulfate 325 (65 FE) MG tablet Take 325 mg by mouth daily with breakfast.   gabapentin (NEURONTIN) 300 MG capsule Take 3 capsules (900 mg total) by mouth at bedtime.   losartan-hydrochlorothiazide (HYZAAR) 100-12.5 MG tablet Take 1 tablet by mouth daily. In am   Multiple Vitamin (MULTIVITAMIN) tablet Take 1 tablet by mouth daily.   mupirocin ointment (BACTROBAN) 2 % Apply 1 application topically 2 (two) times daily. To nose x 1-2 weeks   pantoprazole (PROTONIX) 40 MG tablet  Take 1 tablet (40 mg total) by mouth daily. 30 min before food   pravastatin (PRAVACHOL) 20 MG tablet Take 1 tablet (20 mg total) by mouth 3 (three) times a week.     Review of Systems    All other systems reviewed and are otherwise negative except as noted above.  Physical Exam    VS:  BP 136/78 (BP Location: Left Arm, Patient Position: Sitting, Cuff Size: Normal)   Pulse 81   Ht 6\' 9"  (2.057 m)   Wt 259 lb 3.2 oz (117.6 kg)   SpO2 98%   BMI 27.78 kg/m  , BMI Body mass index is 27.78 kg/m.  Wt Readings from Last 3 Encounters:  05/20/21 259 lb 3.2 oz (117.6 kg)  05/15/21 253 lb 3.2 oz (114.9 kg)  02/26/21 254 lb (115.2 kg)     GEN: Well nourished, well developed, in no acute distress. HEENT: normal. Neck: Supple, no JVD, carotid bruits, or masses. Cardiac: RRR, no murmurs, rubs, or gallops. No clubbing, cyanosis, edema.  Radials/PT 2+ and equal bilaterally.  Respiratory:  Respirations regular and unlabored, clear to auscultation bilaterally. GI: Soft, nontender, nondistended. MS: No deformity or atrophy. Skin: Warm and dry, no rash. Neuro:  Strength and sensation are intact. Psych: Normal affect.  Assessment & Plan    CAD-symptoms not likely related to angina since his symptoms occur after eating and not with exertion. No ischemic work-up indicated at this time however, if he has significant chest pain he is to go to the ED. Mild plaque noted in the coronary arteries on coronary CT done in 2021.  Continue Zetia due to statin intolerance. He is open to trying pravastatin 20mg  every other day. GDMT: ASA, Hyzaar, Zetia.   Hypertension-BP well controlled. Continue current antihypertensive regimen with Norvasc and Hyzaar  GERD-Continue protonix, GI cocktail provided in  the ED. At this time his pain had subsided. GERD diet information provided and discussed food choices. Tums or Rolaids during episodes encouraged.   History of PVCs-Patient currently denies palpitations or  fluttering in his chest.  Tobacco abuse-It will be a year in Dec since he quit. Education provided.    Hyperlipidemia-Continue Zetia. Last LDL 108 on 05/15/2021. Goal LDL < 70. Trial Pravastatin 20mg  every other day and repeat labs in 6 weeks.    Recent COVID-19 virus-resolved. No residual symptoms.   Disposition: Follow up in 8 weeks with , MD or APP.  Signed, Chilton Si, PA-C 05/20/2021, 12:27 PM [facility]

## 2021-05-20 ENCOUNTER — Other Ambulatory Visit: Payer: Self-pay

## 2021-05-20 ENCOUNTER — Other Ambulatory Visit (HOSPITAL_BASED_OUTPATIENT_CLINIC_OR_DEPARTMENT_OTHER): Payer: Self-pay | Admitting: Physician Assistant

## 2021-05-20 ENCOUNTER — Ambulatory Visit (HOSPITAL_BASED_OUTPATIENT_CLINIC_OR_DEPARTMENT_OTHER): Payer: PPO | Admitting: Physician Assistant

## 2021-05-20 ENCOUNTER — Encounter (HOSPITAL_BASED_OUTPATIENT_CLINIC_OR_DEPARTMENT_OTHER): Payer: Self-pay | Admitting: Physician Assistant

## 2021-05-20 VITALS — BP 136/78 | HR 81 | Ht >= 80 in | Wt 259.2 lb

## 2021-05-20 DIAGNOSIS — K219 Gastro-esophageal reflux disease without esophagitis: Secondary | ICD-10-CM

## 2021-05-20 DIAGNOSIS — I2510 Atherosclerotic heart disease of native coronary artery without angina pectoris: Secondary | ICD-10-CM

## 2021-05-20 DIAGNOSIS — R079 Chest pain, unspecified: Secondary | ICD-10-CM

## 2021-05-20 DIAGNOSIS — Z720 Tobacco use: Secondary | ICD-10-CM

## 2021-05-20 DIAGNOSIS — E782 Mixed hyperlipidemia: Secondary | ICD-10-CM

## 2021-05-20 DIAGNOSIS — U071 COVID-19: Secondary | ICD-10-CM | POA: Diagnosis not present

## 2021-05-20 DIAGNOSIS — I10 Essential (primary) hypertension: Secondary | ICD-10-CM

## 2021-05-20 DIAGNOSIS — I493 Ventricular premature depolarization: Secondary | ICD-10-CM | POA: Diagnosis not present

## 2021-05-20 MED ORDER — PRAVASTATIN SODIUM 20 MG PO TABS: 20.0000 mg | ORAL_TABLET | ORAL | 2 refills | Status: DC

## 2021-05-20 NOTE — Patient Instructions (Signed)
Medication Instructions:  Your physician has recommended you make the following change in your medication:   Please start: Pravastatin (Pravachol) 20mg  Tablet 3 times a week  You can either do (M/W/F) OR (T/THURS/SAT)  *If you need a refill on your cardiac medications before your next appointment, please call your pharmacy*   Lab Work: Your physician recommends that you return for lab work in:  Please retin for labs in 6 weeks (07/01/21) approximately. You may come to the [facility] location, [facility] or any [facility] location.  If you have labs (blood work) drawn today and your tests are completely normal, you will receive your results only by: MyChart Message (if you have MyChart) OR A paper copy in the mail If you have any lab test that is abnormal or we need to change your treatment, we will call you to review the results.   Testing/Procedures: None ordered today   Follow-Up: At [facility], you and your health needs are our priority.  As part of our continuing mission to provide you with exceptional heart care, we have created designated Provider Care Teams.  These Care Teams include your primary Cardiologist (physician) and Advanced Practice Providers (APPs -  Physician Assistants and Nurse Practitioners) who all work together to provide you with the care you need, when you need it.  We recommend signing up for the patient portal called "MyChart".  Sign up information is provided on this After Visit Summary.  MyChart is used to connect with patients for Virtual Visits (Telemedicine).  Patients are able to view lab/test results, encounter notes, upcoming appointments, etc.  Non-urgent messages can be sent to your provider as well.   To learn more about what you can do with MyChart, go to [facility].    Your next appointment:   8 week(s) Either a Monday or Thursday  The format for your next appointment:   In Person  Provider:   Tuesday, MD, Chilton Si, NP, or Gillian Shields, NP    Other Instructions  Food Choices for Gastroesophageal Reflux Disease, Adult When you have gastroesophageal reflux disease (GERD), the foods you eat and your eating habits are very important. Choosing the right foods can help ease your discomfort. Think about working with a food expert (dietitian) to help you make good choices. What are tips for following this plan? Reading food labels Look for foods that are low in saturated fat. Foods that may help with your symptoms include: Foods that have less than 5% of daily value (DV) of fat. Foods that have 0 grams of trans fat. Cooking Do not fry your food. Cook your food by baking, steaming, grilling, or broiling. These are all methods that do not need a lot of fat for cooking. To add flavor, try to use herbs that are low in spice and acidity. Meal planning  Choose healthy foods that are low in fat, such as: Fruits and vegetables. Whole grains. Low-fat dairy products. Lean meats, fish, and poultry. Eat small meals often instead of eating 3 large meals each day. Eat your meals slowly in a place where you are relaxed. Avoid bending over or lying down until 2-3 hours after eating. Limit high-fat foods such as fatty meats or fried foods. Limit your intake of fatty foods, such as oils, butter, and shortening. Avoid the following as told by your doctor: Foods that cause symptoms. These may be different for different people. Keep a food diary to keep track of foods that cause symptoms. Alcohol. Drinking  a lot of liquid with meals. Eating meals during the 2-3 hours before bed. Lifestyle Stay at a healthy weight. Ask your doctor what weight is healthy for you. If you need to lose weight, work with your doctor to do so safely. Exercise for at least 30 minutes on 5 or more days each week, or as told by your doctor. Wear loose-fitting clothes. Do not smoke or use any products that contain nicotine or tobacco. If  you need help quitting, ask your doctor. Sleep with the head of your bed higher than your feet. Use a wedge under the mattress or blocks under the bed frame to raise the head of the bed. Chew sugar-free gum after meals. What foods should eat? Eat a healthy, well-balanced diet of fruits, vegetables, whole grains, low-fat dairy products, lean meats, fish, and poultry. Each person is different. Foods that may cause symptoms in one person may not cause any symptoms in another person. Work with your doctor to find foods that are safe for you. The items listed above may not be a complete list of what you can eat and drink. Contact a food expert for more options. What foods should I avoid? Limiting some of these foods may help in managing the symptoms of GERD. Everyone is different. Talk with a food expert or your doctor to help you find the exact foods to avoid, if any. Fruits Any fruits prepared with added fat. Any fruits that cause symptoms. For some people, this may include citrus fruits, such as oranges, grapefruit, pineapple, and lemons. Vegetables Deep-fried vegetables. [place] fries. Any vegetables prepared with added fat. Any vegetables that cause symptoms. For some people, this may include tomatoes and tomato products, chili peppers, onions and garlic, and horseradish. Grains Pastries or quick breads with added fat. Meats and other proteins High-fat meats, such as fatty beef or pork, hot dogs, ribs, ham, sausage, salami, and bacon. Fried meat or protein, including fried fish and fried chicken. Nuts and nut butters, in large amounts. Dairy Whole milk and chocolate milk. Sour cream. Cream. Ice cream. Cream cheese. Milkshakes. Fats and oils Butter. Margarine. Shortening. Ghee. Beverages Coffee and tea, with or without caffeine. Carbonated beverages. Sodas. Energy drinks. Fruit juice made with acidic fruits, such as orange or grapefruit. Tomato juice. Alcoholic drinks. Sweets and  desserts Chocolate and cocoa. Donuts. Seasonings and condiments Pepper. Peppermint and spearmint. Added salt. Any condiments, herbs, or seasonings that cause symptoms. For some people, this may include curry, hot sauce, or vinegar-based salad dressings. The items listed above may not be a complete list of what you should not eat and drink. Contact a food expert for more options. Questions to ask your doctor Diet and lifestyle changes are often the first steps that are taken to manage symptoms of GERD. If diet and lifestyle changes do not help, talk with your doctor about taking medicines. Where to find more information International [employer] for Gastrointestinal Disorders: aboutgerd.org Summary When you have GERD, food and lifestyle choices are very important in easing your symptoms. Eat small meals often instead of 3 large meals a day. Eat your meals slowly and in a place where you are relaxed. Avoid bending over or lying down until 2-3 hours after eating. Limit high-fat foods such as fatty meats or fried foods. This information is not intended to replace advice given to you by your health care provider. Make sure you discuss any questions you have with your health care provider. Document Revised: 12/31/2019 Document Reviewed: 12/31/2019 Elsevier  Patient Education  2022 [employer].

## 2021-06-11 ENCOUNTER — Ambulatory Visit (HOSPITAL_COMMUNITY)
Admission: RE | Admit: 2021-06-11 | Discharge: 2021-06-11 | Disposition: A | Discharge status: Home / Self Care | Payer: PPO | Source: Ambulatory Visit | Attending: Internal Medicine | Admitting: Internal Medicine

## 2021-06-11 ENCOUNTER — Other Ambulatory Visit: Payer: Self-pay

## 2021-06-11 DIAGNOSIS — R200 Anesthesia of skin: Secondary | ICD-10-CM | POA: Diagnosis not present

## 2021-06-11 DIAGNOSIS — M542 Cervicalgia: Secondary | ICD-10-CM | POA: Insufficient documentation

## 2021-06-11 DIAGNOSIS — I700 Atherosclerosis of aorta: Secondary | ICD-10-CM | POA: Diagnosis not present

## 2021-06-11 DIAGNOSIS — F17210 Nicotine dependence, cigarettes, uncomplicated: Secondary | ICD-10-CM | POA: Diagnosis not present

## 2021-06-11 DIAGNOSIS — R0602 Shortness of breath: Secondary | ICD-10-CM | POA: Diagnosis not present

## 2021-06-11 DIAGNOSIS — R079 Chest pain, unspecified: Secondary | ICD-10-CM | POA: Insufficient documentation

## 2021-06-15 ENCOUNTER — Ambulatory Visit (HOSPITAL_COMMUNITY): Payer: PPO

## 2021-06-16 DIAGNOSIS — R9389 Abnormal findings on diagnostic imaging of other specified body structures: Secondary | ICD-10-CM

## 2021-06-16 HISTORY — DX: Abnormal findings on diagnostic imaging of other specified body structures: R93.89

## 2021-06-16 NOTE — Addendum Note (Signed)
Addended by: Quentin Ore on: 06/16/2021 01:13 PM   Modules accepted: Orders

## 2021-07-13 DIAGNOSIS — M542 Cervicalgia: Secondary | ICD-10-CM | POA: Diagnosis not present

## 2021-07-14 NOTE — Progress Notes (Deleted)
Office Visit    Patient Name: John Keith Date of Encounter: 07/16/2021  Primary Care Provider:  [name], [name], MD Primary Cardiologist:  [name], MD  Chief Complaint    71 year old male with a history of minimal nonobstructive CAD per coronary CTA, he is, aortic atherosclerosis, hypertension, hyperlipidemia, anemia, GI bleed/gastric ulcer, tobacco abuse, DDD, chronic low back pain, and ED who presents for follow-up related to chest pain.   Past Medical History    Past Medical History:  Diagnosis Date   Allergy    Arthritis    Carpal tunnel syndrome on left    Chest pain of uncertain etiology [id number]   COVID-19    07/07/19   CTS (carpal tunnel syndrome)    left   DDD (degenerative disc disease), thoracolumbar    unknown location    DDD (degenerative disc disease), thoracolumbar    multilevel   Degenerative joint disease of left shoulder    Degenerative joint disease of left shoulder    02/2011    Diverticulosis    left colon (2008)    ED (erectile dysfunction)    02/2011    ED (erectile dysfunction)    GERD (gastroesophageal reflux disease)    H. pylori infection    H. pylori infection    Hyperlipidemia    Hypertension    Insomnia    Lactose intolerance    Lactose intolerance    Low back pain    PVC (premature ventricular contraction) 10/24/2019   Shingles    07/2018   Smoking    smoking since age 40 y.o   Toe fracture, left    4th in 2014   Vitamin D deficiency    Vitamin D deficiency    Past Surgical History:  Procedure Laterality Date   APPENDECTOMY     BACK SURGERY     x 2 [address] and 1995 in [place]   CARPAL TUNNEL RELEASE Left    COLONOSCOPY     2008 diverticulosis   ESOPHAGOGASTRODUODENOSCOPY     h/o +h. pylori   OTHER SURGICAL HISTORY     heart catherization 1999 normal    SPINE SURGERY     x 2     Allergies  Allergies  Allergen Reactions   Crestor [Rosuvastatin Calcium]  Other (See Comments)    Elevated muscle enzymes    Lipitor [Atorvastatin Calcium] Other (See Comments)    Elevated muscle enzymes    Tomato Itching    History of Present Illness    71 year old male with past medical history including minimal nonobstructive CAD per coronary CTA, aortic atherosclerosis, PVCs, hypertension, hyperlipidemia, anemia, GI bleed/gastric ulcer, tobacco abuse, DDD, chronic low back pain, and ED.  He was initially evaluated by Dr. Oval Linsey in April 2021 for exertional dyspnea atypical chest pain that started following COVID-19 infection.  Additionally, he had been evaluated in the ED multiple times for elevated blood pressures.  He has a family history of CAD with spontaneous cardiac death. His mother died suddenly in a bathtub. He was told that she died of a heart attack.  Coronary CTA in April 2021 showed minimal nonobstructive CAD.  Carotid Dopplers in 2019 showed obstructing plaque in the right bulb and mild disease in left proximal ICA with antegrade flow to vertebrals. He is not on aspirin due to history of gastric ulcers and GI bleed.   He was last seen in the office on May 20, 2021 following a visit to the emergency room for intermittent chest pain, which he described as a burning in his esophagus, not associated with exertion.  He was started on Protonix by PCP with improvement in his symptoms.  Overall, his symptoms were thought to be related to GI rather than ACS.   He presents today for follow-up.  Since his last visit  Mild nonobstructive CAD/atypical chest pain: History of atypical chest pain with strong family history of SCD in mother.  Coronary CTA in April 2021 showed mild nonobstructive CAD.  His symptoms have improved since taking Protonix.  Likely GI in nature. Stable with no anginal symptoms. No indication for ischemic evaluation. Not on ASA due to history of GI bleed.  Continue  losartan-hydrochlorothiazide, Zetia and pravastatin.  Hypertension:  BP well controlled. Continue current antihypertensive regimen.   Hyperlipidemia: LDL was 108 on 05/15/2021.  Repeat lipids, LFTs.  Continue pravastatin, Zetia.  No Aspirin as above.  GERD: Likely contributing to his epigastric discomfort.  Improved on Protonix.  PVCs: Denies palpitations.  Tobacco abuse: He is no longer smoking.   Disposition:   Home Medications    Current Outpatient Medications  Medication Sig Dispense Refill   amLODipine (NORVASC) 5 MG tablet Take 1 tablet (5 mg total) by mouth daily. Goal BP less than 130 (top)/less than 80 (bottom) 90 tablet 3   betamethasone valerate (VALISONE) 0.1 % cream Apply topically 2 (two) times daily. Prn too strong face, underarms, groin 30 g 0   Cholecalciferol (VITAMIN D-3) 125 MCG (5000 UT) TABS Take 1 tablet by mouth daily.     ezetimibe (ZETIA) 10 MG tablet Take 1 tablet (10 mg total) by mouth daily. 90 tablet 3   ferrous sulfate 325 (65 FE) MG tablet Take 325 mg by mouth daily with breakfast.     gabapentin (NEURONTIN) 300 MG capsule Take 3 capsules (900 mg total) by mouth at bedtime. 180 capsule 3   losartan-hydrochlorothiazide (HYZAAR) 100-12.5 MG tablet Take 1 tablet by mouth daily. In am 90 tablet 3   Multiple Vitamin (MULTIVITAMIN) tablet Take 1 tablet by mouth daily.     mupirocin ointment (BACTROBAN) 2 % Apply 1 application topically 2 (two) times daily. To nose x 1-2 weeks 30 g 0   pantoprazole (PROTONIX) 40 MG tablet Take 1 tablet (40 mg total) by mouth daily. 30 min before food 90 tablet 3   pravastatin (PRAVACHOL) 20 MG tablet Take 1 tablet (20 mg total) by mouth 3 (three) times a week. 12 tablet 2   No current facility-administered medications for this visit.     Review of Systems    ***.  All other systems reviewed and are otherwise negative except as noted above.    Physical Exam    VS:  There were no vitals taken for this visit. , BMI There is no height or weight on file to calculate BMI.     GEN: Well  nourished, well developed, in no acute distress. HEENT: normal. Neck: Supple, no JVD, carotid bruits, or masses. Cardiac: RRR, no murmurs, rubs, or gallops. No clubbing, cyanosis, edema.  Radials/DP/PT 2+ and equal bilaterally.  Respiratory:  Respirations regular and unlabored, clear to auscultation bilaterally. GI: Soft, nontender, nondistended, BS + x 4. MS: no deformity or atrophy. Skin: warm and dry, no rash. Neuro:  Strength and sensation are intact. Psych: Normal affect.  Accessory Clinical Findings    ECG personally reviewed by me today - *** - no acute changes.  Lab Results  Component Value Date   WBC 5.6 05/16/2021   HGB 12.7 (L) 05/16/2021   HCT 40.8 05/16/2021   MCV 98.3 05/16/2021   PLT 299 05/16/2021   Lab Results  Component Value Date   CREATININE 1.22 05/16/2021   BUN 14 05/16/2021   NA 136 05/16/2021   K 3.4 (L) 05/16/2021   CL 101 05/16/2021   CO2 26 05/16/2021   Lab Results  Component Value Date   ALT 25 05/15/2021   AST 26 05/15/2021   ALKPHOS 88 05/15/2021   BILITOT 0.4 05/15/2021   Lab Results  Component Value Date   CHOL 176 05/15/2021   HDL 45.80 05/15/2021   LDLCALC 99 12/13/2019   LDLDIRECT 108.0 05/15/2021   TRIG 204.0 (H) 05/15/2021   CHOLHDL 4 05/15/2021    Lab Results  Component Value Date   HGBA1C 5.6 02/26/2021    Assessment & Plan    1.  ***   Lenna Sciara, NP 07/16/2021, 7:08 AM

## 2021-07-16 ENCOUNTER — Ambulatory Visit (HOSPITAL_BASED_OUTPATIENT_CLINIC_OR_DEPARTMENT_OTHER): Payer: PPO | Admitting: Nurse Practitioner

## 2021-07-31 ENCOUNTER — Other Ambulatory Visit (HOSPITAL_BASED_OUTPATIENT_CLINIC_OR_DEPARTMENT_OTHER): Payer: Self-pay | Admitting: Physician Assistant

## 2021-08-12 ENCOUNTER — Ambulatory Visit (INDEPENDENT_AMBULATORY_CARE_PROVIDER_SITE_OTHER): Payer: PPO

## 2021-08-12 VITALS — Ht >= 80 in | Wt 259.0 lb

## 2021-08-12 DIAGNOSIS — Z0000 Encounter for general adult medical examination without abnormal findings: Secondary | ICD-10-CM

## 2021-08-12 NOTE — Progress Notes (Signed)
Subjective:   John Keith is a 71 y.o. male who presents for Medicare Annual/Subsequent preventive examination.  Review of Systems    No ROS.  Medicare Wellness Virtual Visit.  Visual/audio telehealth visit, UTA vital signs.   See social history for additional risk factors.   Cardiac Risk Factors include: advanced age (>44men, >59 women);male gender     Objective:    Today's Vitals   08/12/21 0821  Weight: 259 lb (117.5 kg)  Height: 6\' 9"  (2.057 m)   Body mass index is 27.75 kg/m.  Advanced Directives 08/12/2021 08/11/2020 08/09/2019 11/01/2016 10/14/2016 06/03/2016 03/22/2016  Does Patient Have a Medical Advance Directive? No No No No No No No  Would patient like information on creating a medical advance directive? No - Patient declined No - Patient declined No - Patient declined No - Patient declined No - Patient declined No - Patient declined No - patient declined information  Some encounter information is confidential and restricted. Go to Review Flowsheets activity to see all data.    Current Medications (verified) Outpatient Encounter Medications as of 08/12/2021  Medication Sig   amLODipine (NORVASC) 5 MG tablet Take 1 tablet (5 mg total) by mouth daily. Goal BP less than 130 (top)/less than 80 (bottom)   betamethasone valerate (VALISONE) 0.1 % cream Apply topically 2 (two) times daily. Prn too strong face, underarms, groin   Cholecalciferol (VITAMIN D-3) 125 MCG (5000 UT) TABS Take 1 tablet by mouth daily.   ezetimibe (ZETIA) 10 MG tablet Take 1 tablet (10 mg total) by mouth daily.   ferrous sulfate 325 (65 FE) MG tablet Take 325 mg by mouth daily with breakfast.   gabapentin (NEURONTIN) 300 MG capsule Take 3 capsules (900 mg total) by mouth at bedtime.   losartan-hydrochlorothiazide (HYZAAR) 100-12.5 MG tablet Take 1 tablet by mouth daily. In am   Multiple Vitamin (MULTIVITAMIN) tablet Take 1 tablet by mouth daily.   mupirocin ointment (BACTROBAN) 2 % Apply 1 application  topically 2 (two) times daily. To nose x 1-2 weeks   pantoprazole (PROTONIX) 40 MG tablet Take 1 tablet (40 mg total) by mouth daily. 30 min before food   pravastatin (PRAVACHOL) 20 MG tablet Take 1 tablet (20 mg total) by mouth 3 (three) times a week.   No facility-administered encounter medications on file as of 08/12/2021.    Allergies (verified) Crestor [rosuvastatin calcium], Lipitor [atorvastatin calcium], and Tomato   History: Past Medical History:  Diagnosis Date   Allergy    Arthritis    Carpal tunnel syndrome on left    Chest pain of uncertain etiology 10/24/2019   COVID-19    07/07/19   CTS (carpal tunnel syndrome)    left   DDD (degenerative disc disease), thoracolumbar    unknown location    DDD (degenerative disc disease), thoracolumbar    multilevel   Degenerative joint disease of left shoulder    Degenerative joint disease of left shoulder    02/2011    Diverticulosis    left colon (2008)    ED (erectile dysfunction)    02/2011    ED (erectile dysfunction)    GERD (gastroesophageal reflux disease)    H. pylori infection    H. pylori infection    Hyperlipidemia    Hypertension    Insomnia    Lactose intolerance    Lactose intolerance    Low back pain    PVC (premature ventricular contraction) 10/24/2019   Shingles    07/2018  Smoking    smoking since age 37 y.o   Toe fracture, left    4th in 2014   Vitamin D deficiency    Vitamin D deficiency    Past Surgical History:  Procedure Laterality Date   APPENDECTOMY     BACK SURGERY     x 2 10991 and 1995 in [place]    CARDIAC CATHETERIZATION     1999 nl   CARPAL TUNNEL RELEASE Left    COLONOSCOPY     2008 diverticulosis   ESOPHAGOGASTRODUODENOSCOPY     h/o +h. pylori   OTHER SURGICAL HISTORY     heart catherization 1999 normal    SPINE SURGERY     x 2    Family History  Problem Relation Age of Onset   Heart disease Mother        age 34    Throat cancer Father 58   Cancer Father        throat     Cancer Maternal Uncle        uncle ?maternal or paternal died colon cancer in his 14s    Diabetes Maternal Aunt    Heart attack Maternal Grandmother    Social History   Socioeconomic History   Marital status: Married    Spouse name: Not on file   Number of children: Not on file   Years of education: Not on file   Highest education level: Not on file  Occupational History   Not on file  Tobacco Use   Smoking status: Former    Packs/day: 0.50    Types: Cigarettes    Start date: 07/06/1971   Smokeless tobacco: Never   Tobacco comments:    06/26/20 quit date  Substance and Sexual Activity   Alcohol use: No    Alcohol/week: 0.0 standard drinks   Drug use: No   Sexual activity: Not on file  Other Topics Concern   Not on file  Social History Narrative   Married    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    On child    1 son, 1 daughter   Machine op   Owns guns    Wears seat belt, safe in relationship    Smoker    Retired 06/27/2019   Social Determinants of Corporate investment banker Strain: Not on file  Food Insecurity: Not on file  Transportation Needs: Not on file  Physical Activity: Not on file  Stress: Not on file  Social Connections: Not on file    Tobacco Counseling Counseling given: Not Answered Tobacco comments: 06/26/20 quit date   Clinical Intake:  Pre-visit preparation completed: Yes        Diabetes: No  How often do you need to have someone help you when you read instructions, pamphlets, or other written materials from your doctor or pharmacy?: 1 - Never  Interpreter Needed?: No      Activities of Daily Living In your present state of health, do you have any difficulty performing the following activities: 08/12/2021  Hearing? N  Vision? N  Difficulty concentrating or making decisions? N  Walking or climbing stairs? N  Dressing or bathing? N  Doing errands, shopping? N  Preparing Food and eating ? N  Using the Toilet? N  In the past six months, have you  accidently leaked urine? N  Do you have problems with loss of bowel control? N  Managing your Medications? N  Managing your Finances? N  Housekeeping or managing your Housekeeping? N  Some recent data might be hidden    Patient Care Team: [name], Pasty Spillers, MD as PCP - General (Internal Medicine) Chilton Si, MD as PCP - Cardiology (Cardiology)  Indicate any recent Medical Services you may have received from other than [facility] providers in the past year (date may be approximate).     Assessment:   This is a routine wellness examination for John Keith.  Virtual Visit via Telephone Note  I connected with  John Keith on 08/12/21 at  8:15 AM EST by telephone and verified that I am speaking with the correct person using two identifiers.  Persons participating in the virtual visit: patient/Nurse Health Advisor   I discussed the limitations, risks, security and privacy concerns of performing an evaluation and management service by telephone and the availability of in person appointments. The patient expressed understanding and agreed to proceed.  Interactive audio and video telecommunications were attempted between this nurse and patient, however failed, due to patient having technical difficulties OR patient did not have access to video capability.  We continued and completed visit with audio only.  Some vital signs may be absent or patient reported.   Hearing/Vision screen Hearing Screening - Comments:: Patient is able to hear conversational tones without difficulty. No issues reported. Vision Screening - Comments:: Followed by [facility] They have seen their ophthalmologist in the last 12 months.   Dietary issues and exercise activities discussed: Current Exercise Habits: Home exercise routine, Type of exercise: walking, Intensity: Mild Regular diet Good water intake    Goals Addressed               This Visit's Progress     Patient Stated     I would like to lose  a little weight (pt-stated)   On track     Walk more for exercise.       Depression Screen PHQ 2/9 Scores 08/12/2021 01/20/2021 08/11/2020 12/13/2019 09/28/2019 08/09/2019 07/12/2019  PHQ - 2 Score 0 3 0 0 0 0 0  Some encounter information is confidential and restricted. Go to Review Flowsheets activity to see all data.    Fall Risk Fall Risk  08/12/2021 01/20/2021 08/11/2020 12/13/2019 09/28/2019  Falls in the past year? 0 0 0 0 0  Number falls in past yr: 0 0 0 0 0  Injury with Fall? - 0 0 0 0  Risk for fall due to : - - - - -  Follow up Falls evaluation completed Falls evaluation completed Falls evaluation completed Falls evaluation completed Falls evaluation completed  Some encounter information is confidential and restricted. Go to Review Flowsheets activity to see all data.    FALL RISK PREVENTION PERTAINING TO THE HOME: Home free of loose throw rugs in walkways, pet beds, electrical cords, etc? Yes  Adequate lighting in your home to reduce risk of falls? Yes   ASSISTIVE DEVICES UTILIZED TO PREVENT FALLS: Life alert? No  Use of a cane, walker or w/c? No   TIMED UP AND GO: Was the test performed? No .   Cognitive Function:     6CIT Screen 08/12/2021 08/11/2020 08/09/2019  What Year? 0 points 0 points 0 points  What month? 0 points 0 points 0 points  What time? 0 points 0 points 0 points  Count back from 20 0 points 0 points 0 points  Months in reverse 0 points 0 points 0 points  Repeat phrase 0 points - 0 points  Total Score 0 - 0    Immunizations Immunization  History  Administered Date(s) Administered   Influenza, High Dose Seasonal PF 03/28/2019   Influenza-Unspecified 05/07/2019, 04/25/2020, 03/05/2021   PFIZER(Purple Top)SARS-COV-2 Vaccination 10/04/2019, 10/09/2019, 05/08/2020   Pfizer Covid-19 Vaccine Bivalent Booster 48yrs & up 04/04/2021   Pneumococcal Conjugate-13 06/03/2016   Pneumococcal Polysaccharide-23 02/06/2015   Tdap 06/15/2013   Zoster Recombinat (Shingrix)  06/07/2019   Screening Tests Health Maintenance  Topic Date Due   COLON CANCER SCREENING ANNUAL FOBT  08/13/2016   Zoster Vaccines- Shingrix (2 of 2) 08/15/2021 (Originally 08/02/2019)   TETANUS/TDAP  06/16/2023   COLONOSCOPY (Pts 45-20yrs Insurance coverage will need to be confirmed)  12/18/2026   Pneumonia Vaccine 39+ Years old  Completed   INFLUENZA VACCINE  Completed   COVID-19 Vaccine  Completed   Hepatitis C Screening  Completed   HPV VACCINES  Aged Out   Health Maintenance Health Maintenance Due  Topic Date Due   COLON CANCER SCREENING ANNUAL FOBT  08/13/2016   Lung Cancer Screening: (Low Dose CT Chest recommended if Age 9-80 years, 30 pack-year currently smoking OR have quit w/in 15years.) does not qualify.   Vision Screening: Recommended annual ophthalmology exams for early detection of glaucoma and other disorders of the eye.  Dental Screening: Recommended annual dental exams for proper oral hygiene  Community Resource Referral / Chronic Care Management: CRR required this visit?  No   CCM required this visit?  No      Plan:   Keep all routine maintenance appointments.   I have personally reviewed and noted the following in the patients chart:   Medical and social history Use of alcohol, tobacco or illicit drugs  Current medications and supplements including opioid prescriptions. Patient is not currently taking opioid prescriptions. Functional ability and status Nutritional status Physical activity Advanced directives List of other physicians Hospitalizations, surgeries, and ER visits in previous 12 months Vitals Screenings to include cognitive, depression, and falls Referrals and appointments  In addition, I have reviewed and discussed with patient certain preventive protocols, quality metrics, and best practice recommendations. A written personalized care plan for preventive services as well as general preventive health recommendations were provided to  patient.     Ashok Pall, LPN   12/11/4852

## 2021-08-12 NOTE — Patient Instructions (Addendum)
John Keith , Thank you for taking time to come for your Medicare Wellness Visit. I appreciate your ongoing commitment to your health goals. Please review the following plan we discussed and let me know if I can assist you in the future.   These are the goals we discussed:  Goals       Patient Stated     I would like to lose a little weight (pt-stated)      Walk more for exercise.      Other     Blood Pressure < 140/90        This is a list of the screening recommended for you and due dates:  Health Maintenance  Topic Date Due   Stool Blood Test  08/13/2016   Zoster (Shingles) Vaccine (2 of 2) 08/15/2021*   Tetanus Vaccine  06/16/2023   Colon Cancer Screening  12/18/2026   Pneumonia Vaccine  Completed   Flu Shot  Completed   COVID-19 Vaccine  Completed   Hepatitis C Screening: USPSTF Recommendation to screen - Ages 18-79 yo.  Completed   HPV Vaccine  Aged Out  *Topic was postponed. The date shown is not the original due date.    Advanced directives: not yet completed  Conditions/risks identified: none new  Follow up in one year for your annual wellness visit.   Preventive Care 72 Years and Older, Male Preventive care refers to lifestyle choices and visits with your health care provider that can promote health and wellness. What does preventive care include? A yearly physical exam. This is also called an annual well check. Dental exams once or twice a year. Routine eye exams. Ask your health care provider how often you should have your eyes checked. Personal lifestyle choices, including: Daily care of your teeth and gums. Regular physical activity. Eating a healthy diet. Avoiding tobacco and drug use. Limiting alcohol use. Practicing safe sex. Taking low doses of aspirin every day. Taking vitamin and mineral supplements as recommended by your health care provider. What happens during an annual well check? The services and screenings done by your health care  provider during your annual well check will depend on your age, overall health, lifestyle risk factors, and family history of disease. Counseling  Your health care provider may ask you questions about your: Alcohol use. Tobacco use. Drug use. Emotional well-being. Home and relationship well-being. Sexual activity. Eating habits. History of falls. Memory and ability to understand (cognition). Work and work Statistician. Screening  You may have the following tests or measurements: Height, weight, and BMI. Blood pressure. Lipid and cholesterol levels. These may be checked every 5 years, or more frequently if you are over 66 years old. Skin check. Lung cancer screening. You may have this screening every year starting at age 55 if you have a 30-pack-year history of smoking and currently smoke or have quit within the past 15 years. Fecal occult blood test (FOBT) of the stool. You may have this test every year starting at age 3. Flexible sigmoidoscopy or colonoscopy. You may have a sigmoidoscopy every 5 years or a colonoscopy every 10 years starting at age 43. Prostate cancer screening. Recommendations will vary depending on your family history and other risks. Hepatitis C blood test. Hepatitis B blood test. Sexually transmitted disease (STD) testing. Diabetes screening. This is done by checking your blood sugar (glucose) after you have not eaten for a while (fasting). You may have this done every 1-3 years. Abdominal aortic aneurysm (AAA) screening. You may  need this if you are a current or former smoker. Osteoporosis. You may be screened starting at age 36 if you are at high risk. Talk with your health care provider about your test results, treatment options, and if necessary, the need for more tests. Vaccines  Your health care provider may recommend certain vaccines, such as: Influenza vaccine. This is recommended every year. Tetanus, diphtheria, and acellular pertussis (Tdap, Td)  vaccine. You may need a Td booster every 10 years. Zoster vaccine. You may need this after age 57. Pneumococcal 13-valent conjugate (PCV13) vaccine. One dose is recommended after age 15. Pneumococcal polysaccharide (PPSV23) vaccine. One dose is recommended after age 29. Talk to your health care provider about which screenings and vaccines you need and how often you need them. This information is not intended to replace advice given to you by your health care provider. Make sure you discuss any questions you have with your health care provider. Document Released: 07/18/2015 Document Revised: 03/10/2016 Document Reviewed: 04/22/2015 Elsevier Interactive Patient Education  2017 [place] Prevention in the Home Falls can cause injuries. They can happen to people of all ages. There are many things you can do to make your home safe and to help prevent falls. What can I do on the outside of my home? Regularly fix the edges of walkways and driveways and fix any cracks. Remove anything that might make you trip as you walk through a door, such as a raised step or threshold. Trim any bushes or trees on the path to your home. Use bright outdoor lighting. Clear any walking paths of anything that might make someone trip, such as rocks or tools. Regularly check to see if handrails are loose or broken. Make sure that both sides of any steps have handrails. Any raised decks and porches should have guardrails on the edges. Have any leaves, snow, or ice cleared regularly. Use sand or salt on walking paths during winter. Clean up any spills in your garage right away. This includes oil or grease spills. What can I do in the bathroom? Use night lights. Install grab bars by the toilet and in the tub and shower. Do not use towel bars as grab bars. Use non-skid mats or decals in the tub or shower. If you need to sit down in the shower, use a plastic, non-slip stool. Keep the floor dry. Clean up any  water that spills on the floor as soon as it happens. Remove soap buildup in the tub or shower regularly. Attach bath mats securely with double-sided non-slip rug tape. Do not have throw rugs and other things on the floor that can make you trip. What can I do in the bedroom? Use night lights. Make sure that you have a light by your bed that is easy to reach. Do not use any sheets or blankets that are too big for your bed. They should not hang down onto the floor. Have a firm chair that has side arms. You can use this for support while you get dressed. Do not have throw rugs and other things on the floor that can make you trip. What can I do in the kitchen? Clean up any spills right away. Avoid walking on wet floors. Keep items that you use a lot in easy-to-reach places. If you need to reach something above you, use a strong step stool that has a grab bar. Keep electrical cords out of the way. Do not use floor polish or wax that makes  floors slippery. If you must use wax, use non-skid floor wax. Do not have throw rugs and other things on the floor that can make you trip. What can I do with my stairs? Do not leave any items on the stairs. Make sure that there are handrails on both sides of the stairs and use them. Fix handrails that are broken or loose. Make sure that handrails are as long as the stairways. Check any carpeting to make sure that it is firmly attached to the stairs. Fix any carpet that is loose or worn. Avoid having throw rugs at the top or bottom of the stairs. If you do have throw rugs, attach them to the floor with carpet tape. Make sure that you have a light switch at the top of the stairs and the bottom of the stairs. If you do not have them, ask someone to add them for you. What else can I do to help prevent falls? Wear shoes that: Do not have high heels. Have rubber bottoms. Are comfortable and fit you well. Are closed at the toe. Do not wear sandals. If you use a  stepladder: Make sure that it is fully opened. Do not climb a closed stepladder. Make sure that both sides of the stepladder are locked into place. Ask someone to hold it for you, if possible. Clearly mark and make sure that you can see: Any grab bars or handrails. First and last steps. Where the edge of each step is. Use tools that help you move around (mobility aids) if they are needed. These include: Canes. Walkers. Scooters. Crutches. Turn on the lights when you go into a dark area. Replace any light bulbs as soon as they burn out. Set up your furniture so you have a clear path. Avoid moving your furniture around. If any of your floors are uneven, fix them. If there are any pets around you, be aware of where they are. Review your medicines with your doctor. Some medicines can make you feel dizzy. This can increase your chance of falling. Ask your doctor what other things that you can do to help prevent falls. This information is not intended to replace advice given to you by your health care provider. Make sure you discuss any questions you have with your health care provider. Document Released: 04/17/2009 Document Revised: 11/27/2015 Document Reviewed: 07/26/2014 Elsevier Interactive Patient Education  2017 [employer].

## 2021-08-20 ENCOUNTER — Ambulatory Visit: Payer: PPO | Admitting: Internal Medicine

## 2021-08-27 ENCOUNTER — Ambulatory Visit (INDEPENDENT_AMBULATORY_CARE_PROVIDER_SITE_OTHER): Payer: PPO | Admitting: Internal Medicine

## 2021-08-27 ENCOUNTER — Encounter: Payer: Self-pay | Admitting: Internal Medicine

## 2021-08-27 ENCOUNTER — Other Ambulatory Visit: Payer: Self-pay

## 2021-08-27 VITALS — BP 136/78 | HR 74 | Temp 98.5°F | Resp 16 | Ht >= 80 in | Wt 253.4 lb

## 2021-08-27 DIAGNOSIS — M25552 Pain in left hip: Secondary | ICD-10-CM

## 2021-08-27 DIAGNOSIS — M160 Bilateral primary osteoarthritis of hip: Secondary | ICD-10-CM | POA: Diagnosis not present

## 2021-08-27 DIAGNOSIS — J3489 Other specified disorders of nose and nasal sinuses: Secondary | ICD-10-CM | POA: Diagnosis not present

## 2021-08-27 DIAGNOSIS — E876 Hypokalemia: Secondary | ICD-10-CM

## 2021-08-27 DIAGNOSIS — K921 Melena: Secondary | ICD-10-CM | POA: Diagnosis not present

## 2021-08-27 DIAGNOSIS — K259 Gastric ulcer, unspecified as acute or chronic, without hemorrhage or perforation: Secondary | ICD-10-CM

## 2021-08-27 DIAGNOSIS — I10 Essential (primary) hypertension: Secondary | ICD-10-CM

## 2021-08-27 DIAGNOSIS — E785 Hyperlipidemia, unspecified: Secondary | ICD-10-CM

## 2021-08-27 DIAGNOSIS — Z0000 Encounter for general adult medical examination without abnormal findings: Secondary | ICD-10-CM | POA: Diagnosis not present

## 2021-08-27 LAB — LIPID PANEL
Cholesterol: 171 mg/dL (ref 0–200)
HDL: 48.7 mg/dL (ref >39.00)
LDL Cholesterol: 87 mg/dL (ref 0–99)
NonHDL: 122.62
Total CHOL/HDL Ratio: 4
Triglycerides: 179 mg/dL — ABNORMAL HIGH (ref 0.0–149.0)
VLDL: 35.8 mg/dL (ref 0.0–40.0)

## 2021-08-27 LAB — COMPREHENSIVE METABOLIC PANEL
ALT: 27 U/L (ref 0–53)
AST: 31 U/L (ref 0–37)
Albumin: 4.3 g/dL (ref 3.5–5.2)
Alkaline Phosphatase: 81 U/L (ref 39–117)
BUN: 16 mg/dL (ref 6–23)
CO2: 32 mEq/L (ref 19–32)
Calcium: 9.5 mg/dL (ref 8.4–10.5)
Chloride: 103 mEq/L (ref 96–112)
Creatinine, Ser: 1.31 mg/dL (ref 0.40–1.50)
GFR: 54.64 mL/min — ABNORMAL LOW (ref >60.00)
Glucose, Bld: 80 mg/dL (ref 70–99)
Potassium: 4.1 mEq/L (ref 3.5–5.1)
Sodium: 141 mEq/L (ref 135–145)
Total Bilirubin: 0.6 mg/dL (ref 0.2–1.2)
Total Protein: 7.2 g/dL (ref 6.0–8.3)

## 2021-08-27 LAB — CBC WITH DIFFERENTIAL/PLATELET
Basophils Absolute: 0 10*3/uL (ref 0.0–0.1)
Basophils Relative: 0.6 % (ref 0.0–3.0)
Eosinophils Absolute: 0.2 10*3/uL (ref 0.0–0.7)
Eosinophils Relative: 3 % (ref 0.0–5.0)
HCT: 38 % — ABNORMAL LOW (ref 39.0–52.0)
Hemoglobin: 12.3 g/dL — ABNORMAL LOW (ref 13.0–17.0)
Lymphocytes Relative: 32 % (ref 12.0–46.0)
Lymphs Abs: 1.9 10*3/uL (ref 0.7–4.0)
MCHC: 32.2 g/dL (ref 30.0–36.0)
MCV: 94.7 fl (ref 78.0–100.0)
Monocytes Absolute: 0.6 10*3/uL (ref 0.1–1.0)
Monocytes Relative: 9.4 % (ref 3.0–12.0)
Neutro Abs: 3.3 10*3/uL (ref 1.4–7.7)
Neutrophils Relative %: 55 % (ref 43.0–77.0)
Platelets: 272 10*3/uL (ref 150.0–400.0)
RBC: 4.02 Mil/uL — ABNORMAL LOW (ref 4.22–5.81)
RDW: 13.3 % (ref 11.5–15.5)
WBC: 6 10*3/uL (ref 4.0–10.5)

## 2021-08-27 LAB — MAGNESIUM: Magnesium: 2.2 mg/dL (ref 1.5–2.5)

## 2021-08-27 MED ORDER — DOXYCYCLINE HYCLATE 100 MG PO TABS: 100.0000 mg | ORAL_TABLET | Freq: Two times a day (BID) | ORAL | 0 refills | Status: DC

## 2021-08-27 MED ORDER — MUPIROCIN 2 % EX OINT: 1.0000 "application " | TOPICAL_OINTMENT | Freq: Two times a day (BID) | CUTANEOUS | 0 refills | Status: DC

## 2021-08-27 NOTE — Progress Notes (Signed)
Chief Complaint  Patient presents with   Annual Exam   Annual 1. Htn controlled for age on norvasc 5 mg qd and hyzaar 100-12.5 mg qd  2. Hld on pravachol per cardiology 20 mg 3x per week and zetia 10  3. C/o dark stools on iron and h/o gi ulver will have pt f/u with Dr. Onnie Boer  4. C/o nose sore not resolved with bactroban  5. Left hip pain with h/o moderate b/l hip arthritis nothing tried unable to lie on left side due to pain mild to moderate    Review of Systems  Constitutional:  Negative for weight loss.  HENT:  Negative for hearing loss.   Eyes:  Negative for blurred vision.  Respiratory:  Negative for shortness of breath.   Cardiovascular:  Negative for chest pain.  Gastrointestinal:  Negative for abdominal pain and blood in stool.  Musculoskeletal:  Positive for joint pain. Negative for back pain.  Skin:  Negative for rash.  Neurological:  Negative for headaches.  Psychiatric/Behavioral:  Negative for depression.   Past Medical History:  Diagnosis Date   Allergy    Arthritis    Carpal tunnel syndrome on left    Chest pain of uncertain etiology 61/68/3729   COVID-19    07/07/19   CTS (carpal tunnel syndrome)    left   DDD (degenerative disc disease), thoracolumbar    unknown location    DDD (degenerative disc disease), thoracolumbar    multilevel   Degenerative joint disease of left shoulder    Degenerative joint disease of left shoulder    02/2011    Diverticulosis    left colon (2008)    ED (erectile dysfunction)    02/2011    ED (erectile dysfunction)    GERD (gastroesophageal reflux disease)    H. pylori infection    H. pylori infection    Hyperlipidemia    Hypertension    Insomnia    Lactose intolerance    Lactose intolerance    Low back pain    PVC (premature ventricular contraction) 10/24/2019   Shingles    07/2018   Smoking    smoking since age 62 y.o   Toe fracture, left    4th in 2014   Vitamin D deficiency    Vitamin D deficiency    Past  Surgical History:  Procedure Laterality Date   APPENDECTOMY     BACK SURGERY     x 2 [address]991 and 1995 in [place]   CARPAL TUNNEL RELEASE Left    COLONOSCOPY     2008 diverticulosis   ESOPHAGOGASTRODUODENOSCOPY     h/o +h. pylori   OTHER SURGICAL HISTORY     heart catherization 1999 normal    SPINE SURGERY     x 2    Family History  Problem Relation Age of Onset   Heart disease Mother        age 13    Throat cancer Father 59   Cancer Father        throat    Cancer Maternal Uncle        uncle ?maternal or paternal died colon cancer in his 59s    Diabetes Maternal Aunt    Heart attack Maternal Grandmother    Social History   Socioeconomic History   Marital status: Married    Spouse name: Not on file   Number of children: Not on file   Years of  education: Not on file   Highest education level: Not on file  Occupational History   Not on file  Tobacco Use   Smoking status: Former    Packs/day: 0.50    Types: Cigarettes    Start date: 07/06/1971   Smokeless tobacco: Never   Tobacco comments:    06/26/20 quit date  Substance and Sexual Activity   Alcohol use: No    Alcohol/week: 0.0 standard drinks   Drug use: No   Sexual activity: Not on file  Other Topics Concern   Not on file  Social History Narrative   Married    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 Goal BP less than 130 (top)/less than 80 (bottom)   Cholecalciferol (VITAMIN D-3) 125 MCG  (5000 UT) TABS Take 1 tablet by mouth daily.   doxycycline (VIBRA-TABS) 100 MG tablet Take 1 tablet (100 mg total) by mouth 2 (two) times daily. With food   ezetimibe (ZETIA) 10 MG tablet Take 1 tablet (10 mg total) by mouth daily.   ferrous sulfate 325 (65 FE) MG tablet Take 325 mg by mouth daily with breakfast.   gabapentin (NEURONTIN) 300 MG capsule Take 3 capsules (900 mg total) by mouth at bedtime.   losartan-hydrochlorothiazide (HYZAAR) 100-12.5 MG tablet Take 1 tablet by mouth daily. In am   Multiple Vitamin (MULTIVITAMIN) tablet Take 1 tablet by mouth daily.   pantoprazole (PROTONIX) 40 MG tablet Take 1 tablet (40 mg total) by mouth daily. 30 min before food   pravastatin (PRAVACHOL) 20 MG tablet Take 1 tablet (20 mg total) by mouth 3 (three) times a week.   Allergies  Allergen Reactions   Crestor [Rosuvastatin Calcium] Other (See Comments)    Elevated muscle enzymes    Lipitor [Atorvastatin Calcium] Other (See Comments)    Elevated muscle enzymes    Tomato Itching   No results found for this or any previous visit (from the past 2160 hour(s)). Objective  Body mass index is 27.15 kg/m. Wt Readings from Last 3 Encounters:  08/27/21 253 lb 6.4 oz (114.9 kg)  08/12/21 259 lb (117.5 kg)  05/20/21 259 lb 3.2 oz (117.6 kg)   Temp Readings from Last 3 Encounters:  08/27/21 98.5 F (36.9 C) (Oral)  05/16/21 98 F (36.7 C) (Oral)  05/15/21 (!) 97 F (36.1 C) (Temporal)   BP Readings from Last 3 Encounters:  08/27/21 136/78  05/20/21 136/78  05/16/21 (!) 165/102   Pulse Readings from Last 3 Encounters:  08/27/21 74  05/20/21 81  05/16/21 (!) 58    Physical Exam Vitals and nursing note reviewed.  Constitutional:      Appearance: Normal appearance. He is well-developed and well-groomed.  HENT:     Head: Normocephalic and atraumatic.  Eyes:     Conjunctiva/sclera: Conjunctivae normal.     Pupils: Pupils are equal, round, and reactive to light.  Cardiovascular:      Rate and Rhythm: Normal rate and regular rhythm.     Heart sounds: Normal heart sounds.  Pulmonary:     Effort: Pulmonary effort is normal. No respiratory distress.     Breath sounds: Normal breath sounds.  Abdominal:     Tenderness: There is no abdominal tenderness.  Skin:    General: Skin is warm and moist.  Neurological:     General: No focal deficit present.     Mental Status: He is alert and oriented to person, place, and time. Mental status is at baseline.     Sensory: Sensation is intact.     Motor: Motor function is intact.     Coordination: Coordination is intact.     Gait: Gait is intact. Gait normal.  Psychiatric:        Attention and Perception: Attention and perception normal.        Mood and Affect: Mood and affect normal.        Speech: Speech normal.        Behavior: Behavior normal. Behavior is cooperative.        Thought Content: Thought content normal.        Cognition and Memory: Cognition and memory normal.        Judgment: Judgment normal.    Assessment  Plan  Annual physical exam See below   Black tarry stools - Plan: Ambulatory referral to Gastroenterology Dr. Moshe Salisbury  Ifob home test blood  Gastric ulcer, unspecified chronicity, unspecified whether gastric ulcer hemorrhage or perforation present - Plan: Ambulatory referral to Gastroenterology  Sore in nose - Plan: doxycycline (VIBRA-TABS) 100 MG tablet Nasal sore - Plan: mupirocin ointment (BACTROBAN) 2 %, doxycycline (VIBRA-TABS) 100 MG tablet If not better refer to ent in [place]  Primary osteoarthritis of both hips - Plan: Ambulatory referral to Orthopedic Surgery [facility]   Left hip pain - Plan: Ambulatory referral to Orthopedic Surgery, CANCELED: Ambulatory referral to Orthopedic Surgery  Hypertension, unspecified type - Plan: Comprehensive metabolic panel, CBC with Differential/Platelets  Norvasc 5 mg and hyzaar 100-12.5   Hypokalemia - Plan: Magnesium  Hyperlipidemia,  unspecified hyperlipidemia type - Plan: Lipid panel  Zetia 10 and pravachol 10 3x per week per cards seen 05/2021   HM Flu shot utd prevnar and pna 23 utd 06/03/16 and 02/16/15  Tdap had 06/15/13  shingrix sent to pharmacy h/o shingles had 1/2 check on 2nd dose not had consider restart series in the future covid vx 4/4   Hep B immune  PSA 1.35 05/15/21 Hep C neg 06/03/16  D/c MMR check 2/2 cost  a1c 5.6 02/26/21   Colonoscopy 12/17/16 Dr. Moshe Salisbury  -had 12/17/16 rec repeat in 10 years  Referred today see above  EGD 11/15/19 prob healing ulcer in duodenum, mild deformity antrum likely from old ulcer disease bx negative, hiatal hernia Dr. Lyda Jester    Tobacco abuse long term-rec smoking cessation 1/2 ppd since age 58 y.o max 1 ppd quit 06/26/20 ? pulm referral as of 07/10/20 in the future consider pulm referral    Provider: Dr. Olivia Mackie [name]-[facility] Medicine

## 2021-08-27 NOTE — Patient Instructions (Addendum)
If nose sore does not go away let me know you will need to see ENT in [place]-call me back  Referred to GI for dark stools   Left hip arthritis  Referred to [facility] to get steroid shot  You can try tylenol  Aspercream with lidocaine or voltaren gel or lidocaine pain patch   Conservation officer, nature and [place] [facility] in [place], [place] COVID-19 info: [email or web address] Get online care: [email or web address] Address: [address], [place], [place] [address] Hours:  Open ? Closes 5:30?PM Updated by phone call 9 weeks ago Phone: [phone]   Hip Exercises Ask your health care provider which exercises are safe for you. Do exercises exactly as told by your health care provider and adjust them as directed. It is normal to feel mild stretching, pulling, tightness, or discomfort as you do these exercises. Stop right away if you feel sudden pain or your pain gets worse. Do not begin these exercises until told by your health care provider. Stretching and range-of-motion exercises These exercises warm up your muscles and joints and improve the movement and flexibility of your hip. These exercises also help to relieve pain, numbness, and tingling. You may be asked to limit your range of motion if you had a hip replacement. Talk to your health care provider about these restrictions. Hamstrings, supine  Lie on your back (supine position). Loop a belt or towel over the ball of your left / right foot. The ball of your foot is on the walking surface, right under your toes. Straighten your left / right knee and slowly pull on the belt or towel to raise your leg until you feel a gentle stretch behind your knee (hamstring). Do not let your knee bend while you do this. Keep your other leg flat on the floor. Hold this position for __________ seconds. Slowly return your leg to the starting position. Repeat __________ times. Complete this exercise  __________ times a day. Hip rotation  Lie on your back on a firm surface. With your left / right hand, gently pull your left / right knee toward the shoulder that is on the same side of the body. Stop when your knee is pointing toward the ceiling. Hold your left / right ankle with your other hand. Keeping your knee steady, gently pull your left / right ankle toward your other shoulder until you feel a stretch in your buttocks. Keep your hips and shoulders firmly planted while you do this stretch. Hold this position for __________ seconds. Repeat __________ times. Complete this exercise __________ times a day. Seated stretch This exercise is sometimes called hamstrings and adductors stretch. Sit on the floor with your legs stretched wide. Keep your knees straight during this exercise. Keeping your head and back in a straight line, bend at your waist to reach for your left foot (position A). You should feel a stretch in your right inner thigh (adductors). Hold this position for __________ seconds. Then slowly return to the upright position. Keeping your head and back in a straight line, bend at your waist to reach forward (position B). You should feel a stretch behind both of your thighs and knees (hamstrings). Hold this position for __________ seconds. Then slowly return to the upright position. Keeping your head and back in a straight line, bend at your waist to reach for your right foot (position C). You should feel a stretch in your left inner thigh (adductors). Hold this position for __________ seconds.  Then slowly return to the upright position. Repeat __________ times. Complete this exercise __________ times a day. Lunge This exercise stretches the muscles of the hip (hip flexors). Place your left / right knee on the floor and bend your other knee so that is directly over your ankle. You should be half-kneeling. Keep good posture with your head over your shoulders. Tighten your buttocks  to point your tailbone downward. This will prevent your back from arching too much. You should feel a gentle stretch in the front of your left / right thigh and hip. If you do not feel a stretch, slide your other foot forward slightly and then slowly lunge forward with your chest up until your knee once again lines up over your ankle. Make sure your tailbone continues to point downward. Hold this position for __________ seconds. Slowly return to the starting position. Repeat __________ times. Complete this exercise __________ times a day. Strengthening exercises These exercises build strength and endurance in your hip. Endurance is the ability to use your muscles for a long time, even after they get tired. Bridge This exercise strengthens the muscles of your hip (hip extensors). Lie on your back on a firm surface with your knees bent and your feet flat on the floor. Tighten your buttocks muscles and lift your bottom off the floor until the trunk of your body and your hips are level with your thighs. Do not arch your back. You should feel the muscles working in your buttocks and the back of your thighs. If you do not feel these muscles, slide your feet 1-2 inches (2.5-5 cm) farther away from your buttocks. Hold this position for __________ seconds. Slowly lower your hips to the starting position. Let your muscles relax completely between repetitions. Repeat __________ times. Complete this exercise __________ times a day. Straight leg raises, side-lying This exercise strengthens the muscles that move the hip joint away from the center of the body (hip abductors). Lie on your side with your left / right leg in the top position. Lie so your head, shoulder, hip, and knee line up. You may bend your bottom knee slightly to help you balance. Roll your hips slightly forward, so your hips are stacked directly over each other and your left / right knee is facing forward. Leading with your heel, lift your  top leg 4-6 inches (10-15 cm). You should feel the muscles in your top hip lifting. Do not let your foot drift forward. Do not let your knee roll toward the ceiling. Hold this position for __________ seconds. Slowly return to the starting position. Let your muscles relax completely between repetitions. Repeat __________ times. Complete this exercise __________ times a day. Straight leg raises, side-lying This exercise strengthens the muscles that move the hip joint toward the center of the body (hip adductors). Lie on your side with your left / right leg in the bottom position. Lie so your head, shoulder, hip, and knee line up. You may place your upper foot in front to help you balance. Roll your hips slightly forward, so your hips are stacked directly over each other and your left / right knee is facing forward. Tense the muscles in your inner thigh and lift your bottom leg 4-6 inches (10-15 cm). Hold this position for __________ seconds. Slowly return to the starting position. Let your muscles relax completely between repetitions. Repeat __________ times. Complete this exercise __________ times a day. Straight leg raises, supine This exercise strengthens the muscles in the front of  your thigh (quadriceps). Lie on your back (supine position) with your left / right leg extended and your other knee bent. Tense the muscles in the front of your left / right thigh. You should see your kneecap slide up or see increased dimpling just above your knee. Keep these muscles tight as you raise your leg 4-6 inches (10-15 cm) off the floor. Do not let your knee bend. Hold this position for __________ seconds. Keep these muscles tense as you lower your leg. Relax the muscles slowly and completely between repetitions. Repeat __________ times. Complete this exercise __________ times a day. Hip abductors, standing This exercise strengthens the muscles that move the leg and hip joint away from the center of  the body (hip abductors). Tie one end of a rubber exercise band or tubing to a secure surface, such as a chair, table, or pole. Loop the other end of the band or tubing around your left / right ankle. Keeping your ankle with the band or tubing directly opposite the secured end, step away until there is tension in the tubing or band. Hold on to a chair, table, or pole as needed for balance. Lift your left / right leg out to your side. While you do this: Keep your back upright. Keep your shoulders over your hips. Keep your toes pointing forward. Make sure to use your hip muscles to slowly lift your leg. Do not tip your body or forcefully lift your leg. Hold this position for __________ seconds. Slowly return to the starting position. Repeat __________ times. Complete this exercise __________ times a day. Squats This exercise strengthens the muscles in the front of your thigh (quadriceps). Stand in a door frame so your feet and knees are in line with the frame. You may place your hands on the frame for balance. Slowly bend your knees and lower your hips like you are going to sit in a chair. Keep your lower legs in a straight-up-and-down position. Do not let your hips go lower than your knees. Do not bend your knees lower than told by your health care provider. If your hip pain increases, do not bend as low. Hold this position for ___________ seconds. Slowly push with your legs to return to standing. Do not use your hands to pull yourself to standing. Repeat __________ times. Complete this exercise __________ times a day. This information is not intended to replace advice given to you by your health care provider. Make sure you discuss any questions you have with your health care provider. Document Revised: 11/05/2020 Document Reviewed: 11/05/2020 Elsevier Patient Education  [place].

## 2021-08-27 NOTE — Addendum Note (Signed)
Addended by: Quentin Ore on: 08/27/2021 12:33 PM   Modules accepted: Orders

## 2021-09-01 LAB — FECAL OCCULT BLOOD, IMMUNOCHEMICAL: Fecal Occult Bld: NEGATIVE

## 2021-09-03 ENCOUNTER — Encounter: Payer: Self-pay | Admitting: Podiatry

## 2021-09-03 ENCOUNTER — Ambulatory Visit: Payer: PPO | Admitting: Podiatry

## 2021-09-03 ENCOUNTER — Other Ambulatory Visit: Payer: Self-pay

## 2021-09-03 DIAGNOSIS — M79676 Pain in unspecified toe(s): Secondary | ICD-10-CM | POA: Diagnosis not present

## 2021-09-03 DIAGNOSIS — B351 Tinea unguium: Secondary | ICD-10-CM | POA: Diagnosis not present

## 2021-09-03 DIAGNOSIS — G629 Polyneuropathy, unspecified: Secondary | ICD-10-CM | POA: Diagnosis not present

## 2021-09-09 DIAGNOSIS — M7062 Trochanteric bursitis, left hip: Secondary | ICD-10-CM | POA: Diagnosis not present

## 2021-09-10 NOTE — Progress Notes (Signed)
Subjective: ?John Keith is a 71 y.o. male patient seen today for follow up of  at risk foot care with history of peripheral neuropathy and painful elongated mycotic toenails 1-5 bilaterally which are tender when wearing enclosed shoe gear. Pain is relieved with periodic professional debridement..  ? ?New problem(s)/concern(s) today: None   ? ?PCP is [name], Pasty Spillers, MD. Last visit was: 08/27/2021. ? ?Allergies  ?Allergen Reactions  ? Crestor [Rosuvastatin Calcium] Other (See Comments)  ?  Elevated muscle enzymes ?  ? Lipitor [Atorvastatin Calcium] Other (See Comments)  ?  Elevated muscle enzymes ?  ? Tomato Itching  ? ? ?Objective: ?Physical Exam ? ?General: Patient is a pleasant 71 y.o. African American male in NAD. AAO x 3.  ? ?Neurovascular Examination: ?Capillary refill time to digits immediate b/l. Palpable pedal pulses b/l LE. Pedal hair present. No pain with calf compression b/l. Lower extremity skin temperature gradient within normal limits. No cyanosis or clubbing noted b/l LE. ? ?Pt has subjective symptoms of neuropathy. Protective sensation intact 5/5 intact bilaterally with 10g monofilament b/l. Vibratory sensation intact b/l. ? ?Dermatological:  ?Pedal skin is warm and supple b/l LE. No open wounds b/l LE. No interdigital macerations noted b/l LE. Toenails 1-5 bilaterally elongated, discolored, dystrophic, thickened, and crumbly with subungual debris and tenderness to dorsal palpation. No hyperkeratotic nor porokeratotic lesions present on today's visit. ? ?Musculoskeletal:  ?Muscle strength 5/5 to all lower extremity muscle groups bilaterally. HAV with bunion deformity noted b/l LE. Hammertoe deformity noted 2-5 b/l. ? ?Assessment: ?1. Pain due to onychomycosis of toenail   ?2. Neuropathy   ? ?Plan: ?Patient was evaluated and treated and all questions answered. ?Consent given for treatment as described below: ?-Patient to continue soft, supportive shoe gear daily. ?-Mycotic toenails 1-5  bilaterally were debrided in length and girth with sterile nail nippers and dremel without incident. ?-Patient/POA to call should there be question/concern in the interim. ? ?Return in about 3 months (around 12/04/2021). ? ?Freddie Breech, DPM ?

## 2021-09-24 ENCOUNTER — Encounter (HOSPITAL_COMMUNITY): Payer: Self-pay | Admitting: Physician Assistant

## 2021-09-24 ENCOUNTER — Ambulatory Visit (HOSPITAL_COMMUNITY)
Admission: RE | Admit: 2021-09-24 | Discharge: 2021-09-24 | Disposition: A | Discharge status: Home / Self Care | Payer: PPO | Source: Ambulatory Visit | Attending: Physician Assistant | Admitting: Physician Assistant

## 2021-09-24 ENCOUNTER — Ambulatory Visit (INDEPENDENT_AMBULATORY_CARE_PROVIDER_SITE_OTHER): Payer: PPO | Admitting: Internal Medicine

## 2021-09-24 ENCOUNTER — Encounter: Payer: Self-pay | Admitting: Internal Medicine

## 2021-09-24 ENCOUNTER — Other Ambulatory Visit: Payer: Self-pay

## 2021-09-24 VITALS — BP 146/90 | HR 112 | Ht >= 80 in | Wt 253.4 lb

## 2021-09-24 VITALS — Temp 98.2°F | Ht >= 80 in | Wt 253.2 lb

## 2021-09-24 DIAGNOSIS — D6869 Other thrombophilia: Secondary | ICD-10-CM | POA: Diagnosis not present

## 2021-09-24 DIAGNOSIS — I483 Typical atrial flutter: Secondary | ICD-10-CM

## 2021-09-24 DIAGNOSIS — R009 Unspecified abnormalities of heart beat: Secondary | ICD-10-CM | POA: Diagnosis not present

## 2021-09-24 DIAGNOSIS — Z87891 Personal history of nicotine dependence: Secondary | ICD-10-CM | POA: Diagnosis not present

## 2021-09-24 DIAGNOSIS — E785 Hyperlipidemia, unspecified: Secondary | ICD-10-CM | POA: Diagnosis not present

## 2021-09-24 DIAGNOSIS — Z7901 Long term (current) use of anticoagulants: Secondary | ICD-10-CM | POA: Insufficient documentation

## 2021-09-24 DIAGNOSIS — I10 Essential (primary) hypertension: Secondary | ICD-10-CM | POA: Diagnosis not present

## 2021-09-24 DIAGNOSIS — I2510 Atherosclerotic heart disease of native coronary artery without angina pectoris: Secondary | ICD-10-CM | POA: Insufficient documentation

## 2021-09-24 DIAGNOSIS — R0789 Other chest pain: Secondary | ICD-10-CM

## 2021-09-24 DIAGNOSIS — R0683 Snoring: Secondary | ICD-10-CM | POA: Insufficient documentation

## 2021-09-24 DIAGNOSIS — R42 Dizziness and giddiness: Secondary | ICD-10-CM | POA: Diagnosis not present

## 2021-09-24 DIAGNOSIS — R002 Palpitations: Secondary | ICD-10-CM

## 2021-09-24 DIAGNOSIS — Z79899 Other long term (current) drug therapy: Secondary | ICD-10-CM | POA: Insufficient documentation

## 2021-09-24 LAB — TROPONIN I (HIGH SENSITIVITY): High Sens Troponin I: 37 ng/L (ref 2–17)

## 2021-09-24 MED ORDER — DILTIAZEM HCL ER COATED BEADS 120 MG PO CP24: 120.0000 mg | ORAL_CAPSULE | Freq: Every day | ORAL | 3 refills | Status: DC

## 2021-09-24 MED ORDER — APIXABAN 5 MG PO TABS: 5.0000 mg | ORAL_TABLET | Freq: Two times a day (BID) | ORAL | 3 refills | Status: DC

## 2021-09-24 NOTE — Progress Notes (Signed)
Chief Complaint  ?Patient presents with  ? Dizziness  ? ?Acute  ?1. C/o lightheaded and dizzy since Monday esp with standing and palpitations worse Tuesday and weds and chest pain on and off  ?Orthostatics today lying 122/82 hr 44 sitting 130/80 hr 60 and standing 142/80 HR 69  ? ?2. Left hip pain f/u 10/05/21 noted bursitis per pt and had injection chronic low back pain 8/10 today ? ?Review of Systems  ?Constitutional:  Negative for weight loss.  ?HENT:  Negative for hearing loss.   ?Eyes:  Negative for blurred vision.  ?Respiratory:  Negative for shortness of breath.   ?Cardiovascular:  Positive for chest pain and palpitations.  ?Gastrointestinal:  Negative for abdominal pain and blood in stool.  ?Musculoskeletal:  Negative for back pain.  ?Skin:  Negative for rash.  ?Neurological:  Negative for headaches.  ?Psychiatric/Behavioral:  Negative for depression.   ?Past Medical History:  ?Diagnosis Date  ? Allergy   ? Arthritis   ? Carpal tunnel syndrome on left   ? Chest pain of uncertain etiology 10/24/2019  ? COVID-19   ? 07/07/19  ? CTS (carpal tunnel syndrome)   ? left  ? DDD (degenerative disc disease), thoracolumbar   ? unknown location   ? DDD (degenerative disc disease), thoracolumbar   ? multilevel  ? Degenerative joint disease of left shoulder   ? Degenerative joint disease of left shoulder   ? 02/2011   ? Diverticulosis   ? left colon (2008)   ? ED (erectile dysfunction)   ? 02/2011   ? ED (erectile dysfunction)   ? GERD (gastroesophageal reflux disease)   ? H. pylori infection   ? H. pylori infection   ? Hyperlipidemia   ? Hypertension   ? Insomnia   ? Lactose intolerance   ? Lactose intolerance   ? Low back pain   ? PVC (premature ventricular contraction) 10/24/2019  ? Shingles   ? 07/2018  ? Smoking   ? smoking since age 89 y.o  ? Toe fracture, left   ? 4th in 2014  ? Vitamin D deficiency   ? Vitamin D deficiency   ? ?Past Surgical History:  ?Procedure Laterality Date  ? APPENDECTOMY    ? BACK SURGERY    ? x 2  [address] and 1995 in [place]   ? CARDIAC CATHETERIZATION    ? 1999 nl  ? CARPAL TUNNEL RELEASE Left   ? COLONOSCOPY    ? 2008 diverticulosis  ? ESOPHAGOGASTRODUODENOSCOPY    ? h/o +h. pylori  ? OTHER SURGICAL HISTORY    ? heart catherization 1999 normal   ? SPINE SURGERY    ? x 2   ? ?Family History  ?Problem Relation Age of Onset  ? Heart disease Mother   ?     age 73   ? Throat cancer Father 101  ? Cancer Father   ?     throat   ? Cancer Maternal Uncle   ?     uncle ?maternal or paternal died colon cancer in his 65s   ? Diabetes Maternal Aunt   ? Heart attack Maternal Grandmother   ? ?Social History  ? ?Socioeconomic History  ? Marital status: Married  ?  Spouse name: Not on file  ? Number of children: Not on file  ? Years of education: Not on file  ? Highest education level: Not on file  ?Occupational History  ? Not on file  ?Tobacco Use  ? Smoking status:  Former  ?  Packs/day: 0.50  ?  Types: Cigarettes  ?  Start date: 07/06/1971  ? Smokeless tobacco: Never  ? Tobacco comments:  ?  06/26/20 quit date  ?Substance and Sexual Activity  ? Alcohol use: No  ?  Alcohol/week: 0.0 standard drinks  ? Drug use: No  ? Sexual activity: Not on file  ?Other Topics Concern  ? Not on file  ?Social History Narrative  ? Married   ? 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 On child   ? 1 son, 1 daughter  ? 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 Wears seat belt, safe in relationship   ? Smoker   ? Retired 06/27/2019  ? ?Social Determinants of Health  ? ?Financial Resource Strain: Not on file  ?Food Insecurity: Not on file  ?Transportation Needs: Not on file  ?Physical Activity: Not on file  ?Stress: Not on file  ?Social Connections: Not on file  ?Intimate Partner Violence: Not on file  ? ?Current Meds  ?Medication Sig  ? amLODipine (NORVASC) 5 MG tablet Take 1 tablet (5 mg total) by mouth daily. Goal BP less than 130 (top)/less than 80 (bottom)  ? Cholecalciferol (VITAMIN D-3) 125 MCG (5000 UT) TABS Take 1 tablet by mouth daily.  ? doxycycline (VIBRA-TABS) 100 MG tablet Take  1 tablet (100 mg total) by mouth 2 (two) times daily. With food  ? ezetimibe (ZETIA) 10 MG tablet Take 1 tablet (10 mg total) by mouth daily.  ? ferrous sulfate 325 (65 FE) MG tablet Take 325 mg by mouth daily with breakfast.  ? gabapentin (NEURONTIN) 300 MG capsule Take 3 capsules (900 mg total) by mouth at bedtime.  ? losartan-hydrochlorothiazide (HYZAAR) 100-12.5 MG tablet Take 1 tablet by mouth daily. In am  ? meloxicam (MOBIC) 15 MG tablet Take 15 mg by mouth daily.  ? methocarbamol (ROBAXIN) 500 MG tablet Take 500-1,000 mg by mouth every 6 (six) hours as needed.  ? Multiple Vitamin (MULTIVITAMIN) tablet Take 1 tablet by mouth daily.  ? mupirocin ointment (BACTROBAN) 2 % Apply 1 application topically 2 (two) times daily. To nose x 1-2 weeks  ? pantoprazole (PROTONIX) 40 MG tablet Take 1 tablet (40 mg total) by mouth daily. 30 min before food  ? ?Allergies  ?Allergen Reactions  ? Crestor [Rosuvastatin Calcium] Other (See Comments)  ?  Elevated muscle enzymes ?  ? Lipitor [Atorvastatin Calcium] Other (See Comments)  ?  Elevated muscle enzymes ?  ? Tomato Itching  ? ?Recent Results (from the past 2160 hour(s))  ?Comprehensive metabolic panel     Status: Abnormal  ? Collection Time: 08/27/21 12:15 PM  ?Result Value Ref Range  ? Sodium 141 135 - 145 mEq/L  ? Potassium 4.1 3.5 - 5.1 mEq/L  ? Chloride 103 96 - 112 mEq/L  ? CO2 32 19 - 32 mEq/L  ? Glucose, Bld 80 70 - 99 mg/dL  ? BUN 16 6 - 23 mg/dL  ? Creatinine, Ser 1.31 0.40 - 1.50 mg/dL  ? Total Bilirubin 0.6 0.2 - 1.2 mg/dL  ? Alkaline Phosphatase 81 39 - 117 U/L  ? AST 31 0 - 37 U/L  ? ALT 27 0 - 53 U/L  ? Total Protein 7.2 6.0 - 8.3 g/dL  ? Albumin 4.3 3.5 - 5.2 g/dL  ? GFR 54.64 (L) >60.00 mL/min  ?  Comment: Calculated using the CKD-EPI Creatinine Equation (2021)  ? Calcium 9.5 8.4 - 10.5 mg/dL  ?CBC with Differential/Platelet  Status: Abnormal  ? Collection Time: 08/27/21 12:15 PM  ?Result Value Ref Range  ? WBC 6.0 4.0 - 10.5 K/uL  ? RBC 4.02 (L) 4.22 -  5.81 Mil/uL  ? Hemoglobin 12.3 (L) 13.0 - 17.0 g/dL  ? HCT 38.0 (L) 39.0 - 52.0 %  ? MCV 94.7 78.0 - 100.0 fl  ? MCHC 32.2 30.0 - 36.0 g/dL  ? RDW 13.3 11.5 - 15.5 %  ? Platelets 272.0 150.0 - 400.0 K/uL  ? Neutrophils Relative % 55.0 43.0 - 77.0 %  ? Lymphocytes Relative 32.0 12.0 - 46.0 %  ? Monocytes Relative 9.4 3.0 - 12.0 %  ? Eosinophils Relative 3.0 0.0 - 5.0 %  ? Basophils Relative 0.6 0.0 - 3.0 %  ? Neutro Abs 3.3 1.4 - 7.7 K/uL  ? Lymphs Abs 1.9 0.7 - 4.0 K/uL  ? Monocytes Absolute 0.6 0.1 - 1.0 K/uL  ? Eosinophils Absolute 0.2 0.0 - 0.7 K/uL  ? Basophils Absolute 0.0 0.0 - 0.1 K/uL  ?Magnesium     Status: None  ? Collection Time: 08/27/21 12:15 PM  ?Result Value Ref Range  ? Magnesium 2.2 1.5 - 2.5 mg/dL  ?Lipid panel     Status: Abnormal  ? Collection Time: 08/27/21 12:15 PM  ?Result Value Ref Range  ? Cholesterol 171 0 - 200 mg/dL  ?  Comment: ATP III Classification       Desirable:  < 200 mg/dL               Borderline High:  200 - 239 mg/dL          High:  > = 161 mg/dL  ? Triglycerides 179.0 (H) 0.0 - 149.0 mg/dL  ?  Comment: Normal:  <150 mg/dLBorderline High:  150 - 199 mg/dL  ? HDL 48.70 >39.00 mg/dL  ? VLDL 35.8 0.0 - 40.0 mg/dL  ? LDL Cholesterol 87 0 - 99 mg/dL  ? Total CHOL/HDL Ratio 4   ?  Comment:                Men          Women1/2 Average Risk     3.4          3.3Average Risk          5.0          4.42X Average Risk          9.6          7.13X Average Risk          15.0          11.0                      ? NonHDL 122.62   ?  Comment: NOTE:  Non-HDL goal should be 30 mg/dL higher than patient's LDL goal (i.e. LDL goal of < 70 mg/dL, would have non-HDL goal of < 100 mg/dL)  ?Fecal occult blood, imunochemical([facility])     Status: None  ? Collection Time: 08/31/21 12:07 PM  ? Specimen: Stool  ?Result Value Ref Range  ? Fecal Occult Bld Negative Negative  ? ?Objective  ?Body mass index is 27.13 kg/m?. ?Wt Readings from Last 3 Encounters:  ?09/24/21 253 lb 3.2 oz (114.9 kg)  ?08/27/21  253 lb 6.4 oz (114.9 kg)  ?08/12/21 259 lb (117.5 kg)  ? ?Temp Readings from Last 3 Encounters:  ?09/24/21 98.2 ?F (36.8 ?C) (Oral)  ?08/27/21 98.5 ?F (36.9 ?C) (  Oral)  ?05/16/21 98 ?F (36.7 ?C) (Oral)

## 2021-09-24 NOTE — Patient Instructions (Addendum)
Phone Fax E-mail Address  ?[phone] Not available [address]  ? [place] [address]  ?   ? ?Department ? ? ?Atrial Flutter ?Atrial flutter is a type of abnormal heart rhythm (arrhythmia). The heart has an electrical system that tells it how to beat. In atrial flutter, the signals move rapidly in the top chambers of the heart (the atria). This makes your heart beat very fast. Atrial flutter can come and go, or it can be permanent. ?The goal of treatment is to prevent blood clots from forming, control your heart rate, or restore your heartbeat to a normal rhythm. If this condition is not treated, it can cause serious problems, such as a weakened heart muscle (cardiomyopathy) or a stroke. ?What are the causes? ?This condition is often caused by conditions that damage the heart's electrical system. These include: ?Heart conditions and heart surgery. These include heart attacks and open-heart surgery. ?Lung problems, such as COPD or a blood clot in the lung (pulmonary embolism, or PE). ?Poorly controlled high blood pressure (hypertension). ?Overactive thyroid (hyperthyroidism). ?Diabetes. ?In some cases, the cause of this condition is not known. ?What increases the risk? ?You are more likely to develop this condition if: ?You are an elderly adult. ?You are a man. ?You are overweight (obese). ?You have obstructive sleep apnea. ?You have a family history of atrial flutter. ?You have diabetes. ?You drink a lot of alcohol, especially binge drinking. ?You use drugs, including cannabis. ?You smoke. ?What are the signs or symptoms? ?Symptoms of this condition include: ?A feeling that your heart is pounding or racing (palpitations). ?Shortness of breath. ?Chest pain. ?Feeling dizzy or light-headed. ?Fainting. ?Low blood pressure (hypotension). ?Fatigue. ?Tiring easily during exercise or activity. ?In some cases, there are no symptoms. ?How is this diagnosed? ?This condition may be diagnosed with: ?An  electrocardiogram (ECG) to check electrical signals of the heart. ?An ambulatory cardiac monitor to record your heart's activity for a few days. ?An echocardiogram to create pictures of your heart. ?A transesophageal echocardiogram (TEE) to create even better pictures of your heart. ?A stress test to check your blood supply while you exercise. ?Imaging tests, such as a CT scan or chest X-ray. ?Blood tests. ?How is this treated? ?Treatment depends on underlying conditions and how you feel when you experience atrial flutter. This condition may be treated with: ?Medicines to prevent blood clots or to treat heart rate or heart rhythm problems. ?Electrical cardioversion to reset the heart's rhythm. ?Ablation to remove the heart tissue that sends abnormal signals. ?Left atrial appendage closure to seal the area where blood clots can form. ?In some cases, underlying conditions will be treated. ?Follow these instructions at home: ?Medicines ?Take over-the-counter and prescription medicines only as told by your health care provider. ?Do not take any new medicines without talking to your health care provider. ?If you are taking blood thinners: ?Talk with your health care provider before you take any medicines that contain aspirin or NSAIDs, such as ibuprofen. These medicines increase your risk for dangerous bleeding. ?Take your medicine exactly as told, at the same time every day. ?Avoid activities that could cause injury or bruising, and follow instructions about how to prevent falls. ?Wear a medical alert bracelet or carry a card that lists what medicines you take. ?Lifestyle ?Eat heart-healthy foods. Talk with a dietitian to make an eating plan that is right for you. ?Do not use any products that contain nicotine or tobacco, such as cigarettes, e-cigarettes, and chewing tobacco. If you  need help quitting, ask your health care provider. ?Do not drink alcohol. ?Do not use drugs, including cannabis. ?Lose weight if you are  overweight or obese. ?Exercise regularly as instructed by your health care provider. ?General instructions ?Do not use diet pills unless your health care provider approves. Diet pills may make heart problems worse. ?If you have obstructive sleep apnea, manage your condition as told by your health care provider. ?Keep all follow-up visits as told by your health care provider. This is important. ?Contact a health care provider if you: ?Notice a change in the rate, rhythm, or strength of your heartbeat. ?Are taking a blood thinner and you notice more bruising. ?Have a sudden change in weight. ?Tire more easily when you exercise or do heavy work. ?Get help right away if you have: ?Pain or pressure in your chest. ?Shortness of breath. ?Fainting. ?Increasing sweating with no known cause. ?Side effects of blood thinners, such as blood in your vomit, stool, or urine, or bleeding that cannot stop. ?Any symptoms of a stroke. "BE FAST" is an easy way to remember the main warning signs of a stroke: ?B - Balance. Signs are dizziness, sudden trouble walking, or loss of balance. ?E - Eyes. Signs are trouble seeing or a sudden change in vision. ?F - Face. Signs are sudden weakness or numbness of the face, or the face or eyelid drooping on one side. ?A - Arms. Signs are weakness or numbness in an arm. This happens suddenly and usually on one side of the body. ?S - Speech. Signs are sudden trouble speaking, slurred speech, or trouble understanding what people say. ?T - Time. Time to call emergency services. Write down what time symptoms started. ?Other signs of a stroke, such as: ?A sudden, severe headache with no known cause. ?Nausea or vomiting. ?Seizure. ?These symptoms may represent a serious problem that is an emergency. Do not wait to see if the symptoms will go away. Get medical help right away. Call your local emergency services (911 in the [place] Do not drive yourself to the hospital. ?Summary ?Atrial flutter is an abnormal  heart rhythm that can give you symptoms of palpitations, shortness of breath, or fatigue. ?Atrial flutter is often treated with medicines to keep your heart in a normal rhythm and to prevent a stroke. ?Get help right away if you cannot catch your breath, or have chest pain or pressure. ?Get help right away if you have signs or symptoms of a stroke. ?This information is not intended to replace advice given to you by your health care provider. Make sure you discuss any questions you have with your health care provider. ?Document Revised: 12/13/2018 Document Reviewed: 12/13/2018 ?Elsevier Patient Education ? 2022 [employer] ? ? ?Palpitations ?Palpitations are feelings that your heartbeat is irregular or is faster than normal. It may feel like your heart is fluttering or skipping a beat. Palpitations may be caused by many things, including smoking, caffeine, alcohol, stress, and certain medicines or drugs. Most causes of palpitations are not serious.  ?However, some palpitations can be a sign of a serious problem. Further tests and a thorough medical history will be done to find the cause of your palpitations. Your provider may order tests such as an ECG, labs, an echocardiogram, or an ambulatory continuous ECG monitor. ?Follow these instructions at home: ?Pay attention to any changes in your symptoms. Let your health care provider know about them. Take these actions to help manage your symptoms: ?Eating and drinking ?Follow instructions from  your health care provider about eating or drinking restrictions. You may need to avoid foods and drinks that may cause palpitations. These may include: ?Caffeinated coffee, tea, soft drinks, and energy drinks. ?Chocolate. ?Alcohol. ?Diet pills. ?Lifestyle ?  ?Take steps to reduce your stress and anxiety. Things that can help you relax include: ?Yoga. ?Mind-body activities, such as deep breathing, meditation, or using words and images to create positive thoughts (guided  imagery). ?Physical activity, such as swimming, jogging, or walking. Tell your health care provider if your palpitations increase with activity. If you have chest pain or shortness of breath with activity, do not

## 2021-09-24 NOTE — Progress Notes (Signed)
? ? ?Primary Care Physician: [name], [name], MD ?Primary Cardiologist: Dr Oval Linsey ?Primary Electrophysiologist: none ?Referring Physician: Dr [place]/Dr [name]  ? ? ?John Keith is a 71 y.o. male with a history of HTN, HLD, tobacco abuse, aortic atherosclerosis, prior gastric ulcer/GI bleed, atrial flutter who presents for consultation in the [facility].  The patient was initially diagnosed with atrial flutter after presenting to his PCP with symptoms of dizziness and palpitations starting 09/22/21. ECG at that visit showed atrial flutter. Patient has a CHADS2VASC score of 3. He remains in atrial flutter at his visit today with heart rates 100-130 bpm. There were no specific triggers that he could identify. He does admit to snoring and witnessed apnea. Of note, he does have dark stools, recent fecal occult was negative, felt to be due to iron supplement.   ? ?Today, he denies symptoms of chest pain, shortness of breath, orthopnea, PND, lower extremity edema, presyncope, syncope, snoring, daytime somnolence, bleeding, or neurologic sequela. The patient is tolerating medications without difficulties and is otherwise without complaint today.  ? ? ?Atrial Fibrillation Risk Factors: ? ?he does have symptoms or diagnosis of sleep apnea. ?he does not have a history of rheumatic fever. ?he does not have a history of alcohol use. ?The patient does not have a history of early familial atrial fibrillation or other arrhythmias. ? ?he has a BMI of Body mass index is 27.15 kg/m?Marland KitchenMarland Kitchen ?Filed Weights  ? 09/24/21 1406  ?Weight: 114.9 kg  ? ? ?Family History  ?Problem Relation Age of Onset  ? Heart disease Mother   ?     age 43   ? Throat cancer Father 78  ? Cancer Father   ?     throat   ? Cancer Maternal Uncle   ?     uncle ?maternal or paternal died colon cancer in his 17s   ? Diabetes Maternal Aunt   ? Heart attack Maternal Grandmother   ? ? ? ?Atrial Fibrillation Management  history: ? ?Previous antiarrhythmic drugs: none ?Previous cardioversions: none ?Previous ablations: none ?CHADS2VASC score: 3 ?Anticoagulation history: none ? ? ?Past Medical History:  ?Diagnosis Date  ? Allergy   ? Arthritis   ? Carpal tunnel syndrome on left   ? Chest pain of uncertain etiology 99991111  ? COVID-19   ? 07/07/19  ? CTS (carpal tunnel syndrome)   ? left  ? DDD (degenerative disc disease), thoracolumbar   ? unknown location   ? DDD (degenerative disc disease), thoracolumbar   ? multilevel  ? Degenerative joint disease of left shoulder   ? Degenerative joint disease of left shoulder   ? 02/2011   ? Diverticulosis   ? left colon (2008)   ? ED (erectile dysfunction)   ? 02/2011   ? ED (erectile dysfunction)   ? GERD (gastroesophageal reflux disease)   ? H. pylori infection   ? H. pylori infection   ? Hyperlipidemia   ? Hypertension   ? Insomnia   ? Lactose intolerance   ? Lactose intolerance   ? Low back pain   ? PVC (premature ventricular contraction) 10/24/2019  ? Shingles   ? 07/2018  ? Smoking   ? smoking since age 74 y.o  ? Toe fracture, left   ? 4th in 2014  ? Vitamin D deficiency   ? Vitamin D deficiency   ? ?Past Surgical History:  ?Procedure Laterality Date  ? APPENDECTOMY    ? BACK SURGERY    ?  x 2 10991 and 1995 in [place]   ? CARDIAC CATHETERIZATION    ? 1999 nl  ? CARPAL TUNNEL RELEASE Left   ? COLONOSCOPY    ? 2008 diverticulosis  ? ESOPHAGOGASTRODUODENOSCOPY    ? h/o +h. pylori  ? OTHER SURGICAL HISTORY    ? heart catherization 1999 normal   ? SPINE SURGERY    ? x 2   ? ? ?Current Outpatient Medications  ?Medication Sig Dispense Refill  ? amLODipine (NORVASC) 5 MG tablet Take 1 tablet (5 mg total) by mouth daily. Goal BP less than 130 (top)/less than 80 (bottom) 90 tablet 3  ? Cholecalciferol (VITAMIN D-3) 125 MCG (5000 UT) TABS Take 1 tablet by mouth daily.    ? ezetimibe (ZETIA) 10 MG tablet Take 1 tablet (10 mg total) by mouth daily. 90 tablet 3  ? ferrous sulfate 325 (65 FE) MG tablet Take  325 mg by mouth daily with breakfast.    ? gabapentin (NEURONTIN) 300 MG capsule Take 3 capsules (900 mg total) by mouth at bedtime. 180 capsule 3  ? losartan-hydrochlorothiazide (HYZAAR) 100-12.5 MG tablet Take 1 tablet by mouth daily. In am 90 tablet 3  ? Multiple Vitamin (MULTIVITAMIN) tablet Take 1 tablet by mouth daily.    ? mupirocin ointment (BACTROBAN) 2 % Apply 1 application topically 2 (two) times daily. To nose x 1-2 weeks 30 g 0  ? pantoprazole (PROTONIX) 40 MG tablet Take 1 tablet (40 mg total) by mouth daily. 30 min before food 90 tablet 3  ? ?No current facility-administered medications for this encounter.  ? ? ?Allergies  ?Allergen Reactions  ? Crestor [Rosuvastatin Calcium] Other (See Comments)  ?  Elevated muscle enzymes ?  ? Lipitor [Atorvastatin Calcium] Other (See Comments)  ?  Elevated muscle enzymes ?  ? Tomato Itching  ? ? ?Social History  ? ?Socioeconomic History  ? Marital status: Married  ?  Spouse name: Not on file  ? Number of children: Not on file  ? Years of education: Not on file  ? Highest education level: Not on file  ?Occupational History  ? Not on file  ?Tobacco Use  ? Smoking status: Former  ?  Packs/day: 0.50  ?  Types: Cigarettes  ?  Start date: 07/06/1971  ?  Quit date: 06/26/2020  ?  Years since quitting: 1.2  ? Smokeless tobacco: Never  ? Tobacco comments:  ?  Former smoker 09/24/21  ?Substance and Sexual Activity  ? Alcohol use: No  ?  Alcohol/week: 0.0 standard drinks  ? Drug use: No  ? Sexual activity: Not on file  ?Other Topics Concern  ? Not on file  ?Social History Narrative  ? Married   ? 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 On child   ? 1 son, 1 daughter  ? 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 Wears seat belt, safe in relationship   ? Smoker   ? Retired 06/27/2019  ? ?Social Determinants of Health  ? ?Financial Resource Strain: Not on file  ?Food Insecurity: Not on file  ?Transportation Needs: Not on file  ?Physical Activity: Not on file  ?Stress: Not on file  ?Social Connections: Not on file   ?Intimate Partner Violence: Not on file  ? ? ? ?ROS- All systems are reviewed and negative except as per the HPI above. ? ?Physical Exam: ?Vitals:  ? 09/24/21 1406  ?BP: (!) 146/90  ?Pulse: (!) 112  ?Weight: 114.9 kg  ?Height: 6\' 9"  (  2.057 m)  ? ? ?GEN- The patient is a well appearing male, alert and oriented x 3 today.   ?Head- normocephalic, atraumatic ?Eyes-  Sclera clear, conjunctiva pink ?Ears- hearing intact ?Oropharynx- clear ?Neck- supple  ?Lungs- Clear to ausculation bilaterally, normal work of breathing ?Heart- irregular rate and rhythm, no murmurs, rubs or gallops  ?GI- soft, NT, ND, + BS ?Extremities- no clubbing, cyanosis, or edema ?MS- no significant deformity or atrophy ?Skin- no rash or lesion ?Psych- euthymic mood, full affect ?Neuro- strength and sensation are intact ? ?Wt Readings from Last 3 Encounters:  ?09/24/21 114.9 kg  ?09/24/21 114.9 kg  ?08/27/21 114.9 kg  ? ? ?EKG today demonstrates  ?Typical atrial flutter with variable block, PVC ?Vent. rate 112 BPM ?PR interval * ms ?QRS duration 84 ms ?QT/QTcB 322/439 ms ? ? ?Epic records are reviewed at length today ? ?CHA2DS2-VASc Score = 3  ?The patient's score is based upon: ?CHF History: 0 ?HTN History: 1 ?Diabetes History: 0 ?Stroke History: 0 ?Vascular Disease History: 1 ?Age Score: 1 ?Gender Score: 0 ?    ? ? ?ASSESSMENT AND PLAN: ?1. Typical Atrial flutter ?The patient's CHA2DS2-VASc score is 3, indicating a 3.2% annual risk of stroke.   ?General education about atrial flutter provided and questions answered. We also discussed his stroke risk and the risks and benefits of anticoagulation. ?Will start Eliquis 5 mg BID (fecal occult negative) ?Will start diltiazem 120 mg daily for rate control.  ?Will plan for DCCV after 3 weeks of uninterrupted anticoagulation. Long-term, we did discuss the possibility of atrial flutter ablation. He would like to try medications and DCCV first.  ? ?2. Secondary Hypercoagulable State (ICD10:  D68.69) ?The  patient is at significant risk for stroke/thromboembolism based upon his CHA2DS2-VASc Score of 3.  Start Apixaban (Eliquis).  ? ?3. CAD/aortic atherosclerosis  ?No anginal symptoms. ?On statin ? ?4. Snoring/witnes

## 2021-09-24 NOTE — Patient Instructions (Signed)
Stop amlodipine ? ? ? ?Start Cardizem 120mg  once a day ? ? ? ?Start Eliquis 5mg  twice a day ?

## 2021-09-30 NOTE — Progress Notes (Signed)
? ? ?Primary Care Physician: [name], [name], MD ?Primary Cardiologist: Dr Oval Linsey ?Primary Electrophysiologist: none ?Referring Physician: Dr [place]/Dr [name]  ? ? ?John Keith is a 71 y.o. male with a history of HTN, HLD, tobacco abuse, aortic atherosclerosis, prior gastric ulcer/GI bleed, atrial flutter who presents for follow up in the [facility].  The patient was initially diagnosed with atrial flutter after presenting to his PCP with symptoms of dizziness and palpitations starting 09/22/21. ECG at that visit showed atrial flutter. Patient has a CHADS2VASC score of 3. There were no specific triggers that he could identify. He does admit to snoring and witnessed apnea.  ? ?On follow up today, patient report that he continues to have intermittent dizziness but overall this has improved since starting diltiazem. He remains in atrial flutter today. No bleeding issues on anticoagulation.  ? ?Today, he denies symptoms of palpitations, chest pain, shortness of breath, orthopnea, PND, lower extremity edema, presyncope, syncope, snoring, daytime somnolence, bleeding, or neurologic sequela. The patient is tolerating medications without difficulties and is otherwise without complaint today.  ? ? ?Atrial Fibrillation Risk Factors: ? ?he does have symptoms or diagnosis of sleep apnea. ?he does not have a history of rheumatic fever. ?he does not have a history of alcohol use. ?The patient does not have a history of early familial atrial fibrillation or other arrhythmias. ? ?he has a BMI of Body mass index is 27.5 kg/m?Marland KitchenMarland Kitchen ?Filed Weights  ? 10/01/21 [id number]  ?Weight: 116.4 kg  ? ? ? ?Family History  ?Problem Relation Age of Onset  ? Heart disease Mother   ?     age 9   ? Throat cancer Father 7  ? Cancer Father   ?     throat   ? Cancer Maternal Uncle   ?     uncle ?maternal or paternal died colon cancer in his 69s   ? Diabetes Maternal Aunt   ? Heart attack Maternal  Grandmother   ? ? ? ?Atrial Fibrillation Management history: ? ?Previous antiarrhythmic drugs: none ?Previous cardioversions: none ?Previous ablations: none ?CHADS2VASC score: 3 ?Anticoagulation history: Eliquis ? ? ?Past Medical History:  ?Diagnosis Date  ? Allergy   ? Arthritis   ? Carpal tunnel syndrome on left   ? Chest pain of uncertain etiology [id number]  ? COVID-19   ? 07/07/19  ? CTS (carpal tunnel syndrome)   ? left  ? DDD (degenerative disc disease), thoracolumbar   ? unknown location   ? DDD (degenerative disc disease), thoracolumbar   ? multilevel  ? Degenerative joint disease of left shoulder   ? Degenerative joint disease of left shoulder   ? 02/2011   ? Diverticulosis   ? left colon (2008)   ? ED (erectile dysfunction)   ? 02/2011   ? ED (erectile dysfunction)   ? GERD (gastroesophageal reflux disease)   ? H. pylori infection   ? H. pylori infection   ? Hyperlipidemia   ? Hypertension   ? Insomnia   ? Lactose intolerance   ? Lactose intolerance   ? Low back pain   ? PVC (premature ventricular contraction) 10/24/2019  ? Shingles   ? 07/2018  ? Smoking   ? smoking since age 63 y.o  ? Toe fracture, left   ? 4th in 2014  ? Vitamin D deficiency   ? Vitamin D deficiency   ? ?Past Surgical History:  ?Procedure Laterality Date  ? APPENDECTOMY    ? BACK  SURGERY    ? x 2 [id number] and 1995 in [place]   ? CARDIAC CATHETERIZATION    ? 1999 nl  ? CARPAL TUNNEL RELEASE Left   ? COLONOSCOPY    ? 2008 diverticulosis  ? ESOPHAGOGASTRODUODENOSCOPY    ? h/o +h. pylori  ? OTHER SURGICAL HISTORY    ? heart catherization 1999 normal   ? SPINE SURGERY    ? x 2   ? ? ?Current Outpatient Medications  ?Medication Sig Dispense Refill  ? apixaban (ELIQUIS) 5 MG TABS tablet Take 1 tablet (5 mg total) by mouth 2 (two) times daily. 60 tablet 3  ? Cholecalciferol (VITAMIN D-3) 125 MCG (5000 UT) TABS Take 1 tablet by mouth daily.    ? diltiazem (CARDIZEM CD) 120 MG 24 hr capsule Take 1 capsule (120 mg total) by mouth daily. 30 capsule 3  ?  ezetimibe (ZETIA) 10 MG tablet Take 1 tablet (10 mg total) by mouth daily. 90 tablet 3  ? ferrous sulfate 325 (65 FE) MG tablet Take 325 mg by mouth daily with breakfast.    ? gabapentin (NEURONTIN) 300 MG capsule Take 3 capsules (900 mg total) by mouth at bedtime. 180 capsule 3  ? losartan-hydrochlorothiazide (HYZAAR) 100-12.5 MG tablet Take 1 tablet by mouth daily. In am 90 tablet 3  ? Multiple Vitamin (MULTIVITAMIN) tablet Take 1 tablet by mouth daily.    ? mupirocin ointment (BACTROBAN) 2 % Apply 1 application topically 2 (two) times daily. To nose x 1-2 weeks 30 g 0  ? pantoprazole (PROTONIX) 40 MG tablet Take 1 tablet (40 mg total) by mouth daily. 30 min before food 90 tablet 3  ? ?No current facility-administered medications for this encounter.  ? ? ?Allergies  ?Allergen Reactions  ? Crestor [Rosuvastatin Calcium] Other (See Comments)  ?  Elevated muscle enzymes ?  ? Lipitor [Atorvastatin Calcium] Other (See Comments)  ?  Elevated muscle enzymes ?  ? Tomato Itching  ? ? ?Social History  ? ?Socioeconomic History  ? Marital status: Married  ?  Spouse name: Not on file  ? Number of children: Not on file  ? Years of education: Not on file  ? Highest education level: Not on file  ?Occupational History  ? Not on file  ?Tobacco Use  ? Smoking status: Former  ?  Packs/day: 0.50  ?  Types: Cigarettes  ?  Start date: 07/06/1971  ?  Quit date: 06/26/2020  ?  Years since quitting: 1.2  ? Smokeless tobacco: Never  ? Tobacco comments:  ?  Former smoker 09/24/21  ?Substance and Sexual Activity  ? Alcohol use: No  ?  Alcohol/week: 0.0 standard drinks  ? Drug use: No  ? Sexual activity: Not on file  ?Other Topics Concern  ? Not on file  ?Social History Narrative  ? Married   ? 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 On child   ? 1 son, 1 daughter  ? 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 Wears seat belt, safe in relationship   ? Smoker   ? Retired 06/27/2019  ? ?Social Determinants of Health  ? ?Financial Resource Strain: Not on file  ?Food Insecurity: Not on  file  ?Transportation Needs: Not on file  ?Physical Activity: Not on file  ?Stress: Not on file  ?Social Connections: Not on file  ?Intimate Partner Violence: Not on file  ? ? ? ?ROS- All systems are reviewed and negative except as per the HPI above. ? ?Physical  Exam: ?Vitals:  ? 10/01/21 0822  ?BP: 124/90  ?Pulse: 70  ?Weight: 116.4 kg  ?Height: 6\' 9"  (2.057 m)  ? ? ?GEN- The patient is a well appearing male, alert and oriented x 3 today.   ?HEENT-head normocephalic, atraumatic, sclera clear, conjunctiva pink, hearing intact, trachea midline. ?Lungs- Clear to ausculation bilaterally, normal work of breathing ?Heart- irregular rate and rhythm, no murmurs, rubs or gallops  ?GI- soft, NT, ND, + BS ?Extremities- no clubbing, cyanosis, or edema ?MS- no significant deformity or atrophy ?Skin- no rash or lesion ?Psych- euthymic mood, full affect ?Neuro- strength and sensation are intact ? ? ?Wt Readings from Last 3 Encounters:  ?10/01/21 116.4 kg  ?09/24/21 114.9 kg  ?09/24/21 114.9 kg  ? ? ?EKG today demonstrates  ?Typical atrial flutter with 4:1 block ?Vent. rate 70 BPM ?PR interval * ms ?QRS duration 124 ms ?QT/QTcB 392/423 ms ? ? ?Epic records are reviewed at length today ? ?CHA2DS2-VASc Score = 3  ?The patient's score is based upon: ?CHF History: 0 ?HTN History: 1 ?Diabetes History: 0 ?Stroke History: 0 ?Vascular Disease History: 1 ?Age Score: 1 ?Gender Score: 0 ?    ? ? ?ASSESSMENT AND PLAN: ?1. Typical Atrial flutter ?The patient's CHA2DS2-VASc score is 3, indicating a 3.2% annual risk of stroke.   ?Patient in rate controlled atrial flutter. Will arrange for DCCV after 3 weeks of anticoagulation.  ?Continue Eliquis 5 mg BID ?Continue diltiazem 120 mg daily ?Long-term, we did discuss the possibility of atrial flutter ablation. He would like to try medications and DCCV first.  ? ?2. Secondary Hypercoagulable State (ICD10:  D68.69) ?The patient is at significant risk for stroke/thromboembolism based upon his  CHA2DS2-VASc Score of 3.  Continue Apixaban (Eliquis).  ? ?3. CAD/aortic atherosclerosis  ?On statin ?No anginal symptoms. ? ?4. Snoring/witnessed apnea  ?Patient deferred sleep study for now. ? ?5. HTN ?Stable, no changes

## 2021-09-30 NOTE — H&P (View-Only) (Signed)
? ? ?Primary Care Physician: [name], [name], MD ?Primary Cardiologist: Dr Oval Linsey ?Primary Electrophysiologist: none ?Referring Physician: Dr Fourche/Dr [name]  ? ? ?John Keith is a 71 y.o. male with a history of HTN, HLD, tobacco abuse, aortic atherosclerosis, prior gastric ulcer/GI bleed, atrial flutter who presents for follow up in the [facility].  The patient was initially diagnosed with atrial flutter after presenting to his PCP with symptoms of dizziness and palpitations starting 09/22/21. ECG at that visit showed atrial flutter. Patient has a CHADS2VASC score of 3. There were no specific triggers that he could identify. He does admit to snoring and witnessed apnea.  ? ?On follow up today, patient report that he continues to have intermittent dizziness but overall this has improved since starting diltiazem. He remains in atrial flutter today. No bleeding issues on anticoagulation.  ? ?Today, he denies symptoms of palpitations, chest pain, shortness of breath, orthopnea, PND, lower extremity edema, presyncope, syncope, snoring, daytime somnolence, bleeding, or neurologic sequela. The patient is tolerating medications without difficulties and is otherwise without complaint today.  ? ? ?Atrial Fibrillation Risk Factors: ? ?he does have symptoms or diagnosis of sleep apnea. ?he does not have a history of rheumatic fever. ?he does not have a history of alcohol use. ?The patient does not have a history of early familial atrial fibrillation or other arrhythmias. ? ?he has a BMI of Body mass index is 27.5 kg/m?Marland KitchenMarland Kitchen ?Filed Weights  ? 10/01/21 [id number]  ?Weight: 116.4 kg  ? ? ? ?Family History  ?Problem Relation Age of Onset  ? Heart disease Mother   ?     age 28   ? Throat cancer Father 7  ? Cancer Father   ?     throat   ? Cancer Maternal Uncle   ?     uncle ?maternal or paternal died colon cancer in his 65s   ? Diabetes Maternal Aunt   ? Heart attack Maternal  Grandmother   ? ? ? ?Atrial Fibrillation Management history: ? ?Previous antiarrhythmic drugs: none ?Previous cardioversions: none ?Previous ablations: none ?CHADS2VASC score: 3 ?Anticoagulation history: Eliquis ? ? ?Past Medical History:  ?Diagnosis Date  ? Allergy   ? Arthritis   ? Carpal tunnel syndrome on left   ? Chest pain of uncertain etiology [id number]  ? COVID-19   ? 07/07/19  ? CTS (carpal tunnel syndrome)   ? left  ? DDD (degenerative disc disease), thoracolumbar   ? unknown location   ? DDD (degenerative disc disease), thoracolumbar   ? multilevel  ? Degenerative joint disease of left shoulder   ? Degenerative joint disease of left shoulder   ? 02/2011   ? Diverticulosis   ? left colon (2008)   ? ED (erectile dysfunction)   ? 02/2011   ? ED (erectile dysfunction)   ? GERD (gastroesophageal reflux disease)   ? H. pylori infection   ? H. pylori infection   ? Hyperlipidemia   ? Hypertension   ? Insomnia   ? Lactose intolerance   ? Lactose intolerance   ? Low back pain   ? PVC (premature ventricular contraction) 10/24/2019  ? Shingles   ? 07/2018  ? Smoking   ? smoking since age 33 y.o  ? Toe fracture, left   ? 4th in 2014  ? Vitamin D deficiency   ? Vitamin D deficiency   ? ?Past Surgical History:  ?Procedure Laterality Date  ? APPENDECTOMY    ? BACK  SURGERY    ? x 2 [id number] and 1995 in [place]   ? CARDIAC CATHETERIZATION    ? 1999 nl  ? CARPAL TUNNEL RELEASE Left   ? COLONOSCOPY    ? 2008 diverticulosis  ? ESOPHAGOGASTRODUODENOSCOPY    ? h/o +h. pylori  ? OTHER SURGICAL HISTORY    ? heart catherization 1999 normal   ? SPINE SURGERY    ? x 2   ? ? ?Current Outpatient Medications  ?Medication Sig Dispense Refill  ? apixaban (ELIQUIS) 5 MG TABS tablet Take 1 tablet (5 mg total) by mouth 2 (two) times daily. 60 tablet 3  ? Cholecalciferol (VITAMIN D-3) 125 MCG (5000 UT) TABS Take 1 tablet by mouth daily.    ? diltiazem (CARDIZEM CD) 120 MG 24 hr capsule Take 1 capsule (120 mg total) by mouth daily. 30 capsule 3  ?  ezetimibe (ZETIA) 10 MG tablet Take 1 tablet (10 mg total) by mouth daily. 90 tablet 3  ? ferrous sulfate 325 (65 FE) MG tablet Take 325 mg by mouth daily with breakfast.    ? gabapentin (NEURONTIN) 300 MG capsule Take 3 capsules (900 mg total) by mouth at bedtime. 180 capsule 3  ? losartan-hydrochlorothiazide (HYZAAR) 100-12.5 MG tablet Take 1 tablet by mouth daily. In am 90 tablet 3  ? Multiple Vitamin (MULTIVITAMIN) tablet Take 1 tablet by mouth daily.    ? mupirocin ointment (BACTROBAN) 2 % Apply 1 application topically 2 (two) times daily. To nose x 1-2 weeks 30 g 0  ? pantoprazole (PROTONIX) 40 MG tablet Take 1 tablet (40 mg total) by mouth daily. 30 min before food 90 tablet 3  ? ?No current facility-administered medications for this encounter.  ? ? ?Allergies  ?Allergen Reactions  ? Crestor [Rosuvastatin Calcium] Other (See Comments)  ?  Elevated muscle enzymes ?  ? Lipitor [Atorvastatin Calcium] Other (See Comments)  ?  Elevated muscle enzymes ?  ? Tomato Itching  ? ? ?Social History  ? ?Socioeconomic History  ? Marital status: Married  ?  Spouse name: Not on file  ? Number of children: Not on file  ? Years of education: Not on file  ? Highest education level: Not on file  ?Occupational History  ? Not on file  ?Tobacco Use  ? Smoking status: Former  ?  Packs/day: 0.50  ?  Types: Cigarettes  ?  Start date: 07/06/1971  ?  Quit date: 06/26/2020  ?  Years since quitting: 1.2  ? Smokeless tobacco: Never  ? Tobacco comments:  ?  Former smoker 09/24/21  ?Substance and Sexual Activity  ? Alcohol use: No  ?  Alcohol/week: 0.0 standard drinks  ? Drug use: No  ? Sexual activity: Not on file  ?Other Topics Concern  ? Not on file  ?Social History Narrative  ? Married   ? 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 On child   ? 1 son, 1 daughter  ? 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 Wears seat belt, safe in relationship   ? Smoker   ? Retired 06/27/2019  ? ?Social Determinants of Health  ? ?Financial Resource Strain: Not on file  ?Food Insecurity: Not on  file  ?Transportation Needs: Not on file  ?Physical Activity: Not on file  ?Stress: Not on file  ?Social Connections: Not on file  ?Intimate Partner Violence: Not on file  ? ? ? ?ROS- All systems are reviewed and negative except as per the HPI above. ? ?Physical  Exam: ?Vitals:  ? 10/01/21 0822  ?BP: 124/90  ?Pulse: 70  ?Weight: 116.4 kg  ?Height: 6\' 9"  (2.057 m)  ? ? ?GEN- The patient is a well appearing male, alert and oriented x 3 today.   ?HEENT-head normocephalic, atraumatic, sclera clear, conjunctiva pink, hearing intact, trachea midline. ?Lungs- Clear to ausculation bilaterally, normal work of breathing ?Heart- irregular rate and rhythm, no murmurs, rubs or gallops  ?GI- soft, NT, ND, + BS ?Extremities- no clubbing, cyanosis, or edema ?MS- no significant deformity or atrophy ?Skin- no rash or lesion ?Psych- euthymic mood, full affect ?Neuro- strength and sensation are intact ? ? ?Wt Readings from Last 3 Encounters:  ?10/01/21 116.4 kg  ?09/24/21 114.9 kg  ?09/24/21 114.9 kg  ? ? ?EKG today demonstrates  ?Typical atrial flutter with 4:1 block ?Vent. rate 70 BPM ?PR interval * ms ?QRS duration 124 ms ?QT/QTcB 392/423 ms ? ? ?Epic records are reviewed at length today ? ?CHA2DS2-VASc Score = 3  ?The patient's score is based upon: ?CHF History: 0 ?HTN History: 1 ?Diabetes History: 0 ?Stroke History: 0 ?Vascular Disease History: 1 ?Age Score: 1 ?Gender Score: 0 ?    ? ? ?ASSESSMENT AND PLAN: ?1. Typical Atrial flutter ?The patient's CHA2DS2-VASc score is 3, indicating a 3.2% annual risk of stroke.   ?Patient in rate controlled atrial flutter. Will arrange for DCCV after 3 weeks of anticoagulation.  ?Continue Eliquis 5 mg BID ?Continue diltiazem 120 mg daily ?Long-term, we did discuss the possibility of atrial flutter ablation. He would like to try medications and DCCV first.  ? ?2. Secondary Hypercoagulable State (ICD10:  D68.69) ?The patient is at significant risk for stroke/thromboembolism based upon his  CHA2DS2-VASc Score of 3.  Continue Apixaban (Eliquis).  ? ?3. CAD/aortic atherosclerosis  ?On statin ?No anginal symptoms. ? ?4. Snoring/witnessed apnea  ?Patient deferred sleep study for now. ? ?5. HTN ?Stable, no changes

## 2021-10-01 ENCOUNTER — Ambulatory Visit (HOSPITAL_COMMUNITY)
Admission: RE | Admit: 2021-10-01 | Discharge: 2021-10-01 | Disposition: A | Discharge status: Home / Self Care | Payer: PPO | Source: Ambulatory Visit | Attending: Physician Assistant | Admitting: Physician Assistant

## 2021-10-01 ENCOUNTER — Encounter (HOSPITAL_COMMUNITY): Payer: Self-pay | Admitting: Physician Assistant

## 2021-10-01 VITALS — BP 124/90 | HR 70 | Ht >= 80 in | Wt 256.6 lb

## 2021-10-01 DIAGNOSIS — I2510 Atherosclerotic heart disease of native coronary artery without angina pectoris: Secondary | ICD-10-CM | POA: Insufficient documentation

## 2021-10-01 DIAGNOSIS — Z8249 Family history of ischemic heart disease and other diseases of the circulatory system: Secondary | ICD-10-CM | POA: Diagnosis not present

## 2021-10-01 DIAGNOSIS — I483 Typical atrial flutter: Secondary | ICD-10-CM | POA: Insufficient documentation

## 2021-10-01 DIAGNOSIS — R0683 Snoring: Secondary | ICD-10-CM | POA: Insufficient documentation

## 2021-10-01 DIAGNOSIS — Z7901 Long term (current) use of anticoagulants: Secondary | ICD-10-CM | POA: Diagnosis not present

## 2021-10-01 DIAGNOSIS — Z79899 Other long term (current) drug therapy: Secondary | ICD-10-CM | POA: Diagnosis not present

## 2021-10-01 DIAGNOSIS — E785 Hyperlipidemia, unspecified: Secondary | ICD-10-CM | POA: Diagnosis not present

## 2021-10-01 DIAGNOSIS — D6869 Other thrombophilia: Secondary | ICD-10-CM | POA: Diagnosis not present

## 2021-10-01 DIAGNOSIS — Z87891 Personal history of nicotine dependence: Secondary | ICD-10-CM | POA: Diagnosis not present

## 2021-10-01 DIAGNOSIS — Z8616 Personal history of COVID-19: Secondary | ICD-10-CM | POA: Diagnosis not present

## 2021-10-01 DIAGNOSIS — I10 Essential (primary) hypertension: Secondary | ICD-10-CM | POA: Insufficient documentation

## 2021-10-01 LAB — CBC
HCT: 38.4 % — ABNORMAL LOW (ref 39.0–52.0)
Hemoglobin: 12.3 g/dL — ABNORMAL LOW (ref 13.0–17.0)
MCH: 31.3 pg (ref 26.0–34.0)
MCHC: 32 g/dL (ref 30.0–36.0)
MCV: 97.7 fL (ref 80.0–100.0)
Platelets: 266 10*3/uL (ref 150–400)
RBC: 3.93 MIL/uL — ABNORMAL LOW (ref 4.22–5.81)
RDW: 12.9 % (ref 11.5–15.5)
WBC: 4.1 10*3/uL (ref 4.0–10.5)
nRBC: 0 % (ref 0.0–0.2)

## 2021-10-01 LAB — BASIC METABOLIC PANEL
Anion gap: 10 (ref 5–15)
BUN: 15 mg/dL (ref 8–23)
CO2: 24 mmol/L (ref 22–32)
Calcium: 9.1 mg/dL (ref 8.9–10.3)
Chloride: 108 mmol/L (ref 98–111)
Creatinine, Ser: 1.3 mg/dL — ABNORMAL HIGH (ref 0.61–1.24)
GFR, Estimated: 59 mL/min — ABNORMAL LOW (ref >60)
Glucose, Bld: 130 mg/dL — ABNORMAL HIGH (ref 70–99)
Potassium: 3.5 mmol/L (ref 3.5–5.1)
Sodium: 142 mmol/L (ref 135–145)

## 2021-10-01 NOTE — Patient Instructions (Signed)
Cardioversion scheduled for Monday. April 17th ? - Arrive at the [facility] and go to admitting at [place] ? - Do not eat or drink anything after midnight the night prior to your procedure. ? - Take all your morning medication (except diabetic medications) with a sip of water prior to arrival. ? - You will not be able to drive home after your procedure. ? - Do NOT miss any doses of your blood thinner - if you should miss a dose please notify our office immediately. ? - If you feel as if you go back into normal rhythm prior to scheduled cardioversion, please notify our office immediately. If your procedure is canceled in the cardioversion suite you will be charged a cancellation fee. ? ?

## 2021-10-05 DIAGNOSIS — M25552 Pain in left hip: Secondary | ICD-10-CM | POA: Diagnosis not present

## 2021-10-05 DIAGNOSIS — M533 Sacrococcygeal disorders, not elsewhere classified: Secondary | ICD-10-CM | POA: Diagnosis not present

## 2021-10-19 ENCOUNTER — Encounter (HOSPITAL_COMMUNITY)
Admission: RE | Disposition: A | Discharge status: Home / Self Care | Payer: Self-pay | Source: Home / Self Care | Attending: Internal Medicine

## 2021-10-19 ENCOUNTER — Ambulatory Visit (HOSPITAL_COMMUNITY)
Admission: RE | Admit: 2021-10-19 | Discharge: 2021-10-19 | Disposition: A | Discharge status: Home / Self Care | Payer: PPO | Attending: Internal Medicine | Admitting: Internal Medicine

## 2021-10-19 ENCOUNTER — Ambulatory Visit (HOSPITAL_BASED_OUTPATIENT_CLINIC_OR_DEPARTMENT_OTHER): Payer: PPO | Admitting: Anesthesiology

## 2021-10-19 ENCOUNTER — Encounter (HOSPITAL_COMMUNITY): Payer: Self-pay | Admitting: Internal Medicine

## 2021-10-19 ENCOUNTER — Ambulatory Visit (HOSPITAL_COMMUNITY): Payer: PPO | Admitting: Anesthesiology

## 2021-10-19 DIAGNOSIS — E785 Hyperlipidemia, unspecified: Secondary | ICD-10-CM | POA: Insufficient documentation

## 2021-10-19 DIAGNOSIS — I4892 Unspecified atrial flutter: Secondary | ICD-10-CM | POA: Insufficient documentation

## 2021-10-19 DIAGNOSIS — I483 Typical atrial flutter: Secondary | ICD-10-CM | POA: Diagnosis not present

## 2021-10-19 DIAGNOSIS — Z79899 Other long term (current) drug therapy: Secondary | ICD-10-CM | POA: Insufficient documentation

## 2021-10-19 DIAGNOSIS — Z7901 Long term (current) use of anticoagulants: Secondary | ICD-10-CM | POA: Insufficient documentation

## 2021-10-19 DIAGNOSIS — Z87891 Personal history of nicotine dependence: Secondary | ICD-10-CM | POA: Insufficient documentation

## 2021-10-19 DIAGNOSIS — I4891 Unspecified atrial fibrillation: Secondary | ICD-10-CM | POA: Diagnosis not present

## 2021-10-19 DIAGNOSIS — I10 Essential (primary) hypertension: Secondary | ICD-10-CM | POA: Diagnosis not present

## 2021-10-19 DIAGNOSIS — I700 Atherosclerosis of aorta: Secondary | ICD-10-CM | POA: Diagnosis not present

## 2021-10-19 DIAGNOSIS — I2510 Atherosclerotic heart disease of native coronary artery without angina pectoris: Secondary | ICD-10-CM

## 2021-10-19 DIAGNOSIS — D649 Anemia, unspecified: Secondary | ICD-10-CM | POA: Diagnosis not present

## 2021-10-19 HISTORY — PX: CARDIOVERSION: SHX1299

## 2021-10-19 SURGERY — CARDIOVERSION
Anesthesia: General

## 2021-10-19 MED ORDER — PROPOFOL 10 MG/ML IV BOLUS
INTRAVENOUS | Status: DC | PRN
  Administered 2021-10-19: 45 mg via INTRAVENOUS

## 2021-10-19 MED ORDER — SODIUM CHLORIDE 0.9 % IV SOLN: INTRAVENOUS | Status: DC

## 2021-10-19 NOTE — Discharge Instructions (Signed)

## 2021-10-19 NOTE — Anesthesia Procedure Notes (Signed)
Procedure Name: General with mask airway ?Date/Time: 10/19/2021 10:44 AM ?Performed by: Lovie Chol, CRNA ?Pre-anesthesia Checklist: Patient identified, Emergency Drugs available, Suction available and Patient being monitored ?Patient Re-evaluated:Patient Re-evaluated prior to induction ?Oxygen Delivery Method: Ambu bag ?Preoxygenation: Pre-oxygenation with 100% oxygen ?Induction Type: IV induction ? ? ? ? ?

## 2021-10-19 NOTE — Transfer of Care (Signed)
Immediate Anesthesia Transfer of Care Note ? ?Patient: John Keith ? ?Procedure(s) Performed: CARDIOVERSION ? ?Patient Location: Endoscopy Unit ? ?Anesthesia Type:General ? ?Level of Consciousness: awake, oriented and patient cooperative ? ?Airway & Oxygen Therapy: Patient Spontanous Breathing and Patient connected to nasal cannula oxygen ? ?Post-op Assessment: Report given to RN and Post -op Vital signs reviewed and stable ? ?Post vital signs: Reviewed ? ?Last Vitals:  ?Vitals Value Taken Time  ?BP    ?Temp    ?Pulse    ?Resp    ?SpO2    ? ? ?Last Pain:  ?Vitals:  ? 10/19/21 0846  ?TempSrc: Oral  ?PainSc: 0-No pain  ?   ? ?  ? ?Complications: No notable events documented. ?

## 2021-10-19 NOTE — CV Procedure (Signed)
? ?  Electrical Cardioversion Procedure Note ?JAYCEN LAMPI ?[id number] ?10-17-1950 ? ?Procedure: Electrical Cardioversion ?Indications:  Atrial Flutter ? ?Time Out: Verified patient identification, verified procedure,medications/allergies/relevent history reviewed, required imaging and test results available.  Performed ? ?Procedure Details ? ?The patient was NPO after midnight. Anesthesia was administered at the beside  by Dr.Moser.  Cardioversion was done with synchronized biphasic defibrillation with AP pads with 200 Joules.  The patient converted to normal sinus rhythm. The patient tolerated the procedure well  ? ?IMPRESSION: ? ?Successful cardioversion of atrial flutter ? ? ? ?Malonie Tatum A Custer Pimenta ?10/19/2021, 10:51 AM ? ? ?

## 2021-10-19 NOTE — Interval H&P Note (Signed)
History and Physical Interval Note: ? ?10/19/2021 ?8:43 AM ? ?[name]  has presented today for surgery, with the diagnosis of AFIB.  The various methods of treatment have been discussed with the patient and family. After consideration of risks, benefits and other options for treatment, the patient has consented to  Procedure(s): ?CARDIOVERSION (N/A) as a surgical intervention.  The patient's history has been reviewed, patient examined, no change in status, stable for surgery.  I have reviewed the patient's chart and labs.  Questions were answered to the patient's satisfaction.   ? ? ?John Keith A Bartley Vuolo ? ? ?

## 2021-10-20 ENCOUNTER — Encounter (HOSPITAL_COMMUNITY): Payer: Self-pay | Admitting: Internal Medicine

## 2021-10-20 NOTE — Anesthesia Postprocedure Evaluation (Signed)
Anesthesia Post Note ? ?Patient: [name] ? ?Procedure(s) Performed: CARDIOVERSION ? ?  ? ?Patient location during evaluation: Endoscopy ?Anesthesia Type: General ?Level of consciousness: patient cooperative and awake ?Pain management: pain level controlled ?Vital Signs Assessment: post-procedure vital signs reviewed and stable ?Respiratory status: spontaneous breathing, nonlabored ventilation and respiratory function stable ?Cardiovascular status: blood pressure returned to baseline and stable ?Postop Assessment: no apparent nausea or vomiting ?Anesthetic complications: no ? ? ?No notable events documented. ? ?Last Vitals:  ?Vitals:  ? 10/19/21 1110 10/19/21 1120  ?BP: 124/90 129/85  ?Pulse: 62 62  ?Resp: (!) 9 11  ?Temp:    ?SpO2: 98% 98%  ?  ?Last Pain:  ?Vitals:  ? 10/19/21 1120  ?TempSrc:   ?PainSc: 0-No pain  ? ? ?  ?  ?  ?  ?  ?  ? ?John Keith ? ? ? ? ?

## 2021-10-20 NOTE — Anesthesia Preprocedure Evaluation (Signed)
Anesthesia Evaluation  ?Patient identified by MRN, date of birth, ID band ?Patient awake ? ? ? ?Reviewed: ?Allergy & Precautions, NPO status , Patient's Chart, lab work & pertinent test results ? ?History of Anesthesia Complications ?Negative for: history of anesthetic complications ? ?Airway ?Mallampati: III ? ?TM Distance: >3 FB ?Neck ROM: Full ? ? ? Dental ? ?(+) Dental Advisory Given ?  ?Pulmonary ?former smoker,  ?  ?breath sounds clear to auscultation ? ? ? ? ? ? Cardiovascular ?hypertension, + CAD  ?+ dysrhythmias Atrial Fibrillation  ?Rhythm:Irregular  ? ?  ?Neuro/Psych ? Neuromuscular disease negative psych ROS  ? GI/Hepatic ?PUD, GERD  ,  ?Endo/Other  ? ? Renal/GU ?  ? ?  ?Musculoskeletal ? ? Abdominal ?  ?Peds ? Hematology ? ?(+) Blood dyscrasia, anemia ,   ?Anesthesia Other Findings ? ? Reproductive/Obstetrics ? ?  ? ? ? ? ? ? ? ? ? ? ? ? ? ?  ?  ? ? ? ? ? ? ? ? ?Anesthesia Physical ?Anesthesia Plan ? ?ASA: 3 ? ?Anesthesia Plan: General  ? ?Post-op Pain Management: Minimal or no pain anticipated  ? ?Induction: Intravenous ? ?PONV Risk Score and Plan: 2 and Treatment may vary due to age or medical condition ? ?Airway Management Planned: Mask ? ?Additional Equipment: None ? ?Intra-op Plan:  ? ?Post-operative Plan:  ? ?Informed Consent: I have reviewed the patients History and Physical, chart, labs and discussed the procedure including the risks, benefits and alternatives for the proposed anesthesia with the patient or authorized representative who has indicated his/her understanding and acceptance.  ? ? ? ?Dental advisory given ? ?Plan Discussed with: CRNA ? ?Anesthesia Plan Comments:   ? ? ? ? ? ? ?Anesthesia Quick Evaluation ? ?

## 2021-10-26 ENCOUNTER — Encounter (HOSPITAL_COMMUNITY): Payer: Self-pay

## 2021-10-26 ENCOUNTER — Inpatient Hospital Stay (HOSPITAL_COMMUNITY): Admission: RE | Admit: 2021-10-26 | Payer: PPO | Source: Ambulatory Visit | Admitting: Physician Assistant

## 2021-10-26 NOTE — Progress Notes (Incomplete)
? ? ?Primary Care Physician: [name], [name], MD ?Primary Cardiologist: Dr Oval Linsey ?Primary Electrophysiologist: none ?Referring Physician: Dr Thurston/Dr [name]  ? ? ?John Keith is a 70 y.o. male with a history of HTN, HLD, tobacco abuse, aortic atherosclerosis, prior gastric ulcer/GI bleed, atrial flutter who presents for follow up in the [facility].  The patient was initially diagnosed with atrial flutter after presenting to his PCP with symptoms of dizziness and palpitations starting 09/22/21. ECG at that visit showed atrial flutter. Patient has a CHADS2VASC score of 3. There were no specific triggers that he could identify. He does admit to snoring and witnessed apnea.  ? ?On follow up today, patient is s/p DCCV on 10/19/21. *** ? ?Today, he denies symptoms of ***palpitations, chest pain, shortness of breath, orthopnea, PND, lower extremity edema, presyncope, syncope, snoring, daytime somnolence, bleeding, or neurologic sequela. The patient is tolerating medications without difficulties and is otherwise without complaint today.  ? ? ?Atrial Fibrillation Risk Factors: ? ?he does have symptoms or diagnosis of sleep apnea. ?he does not have a history of rheumatic fever. ?he does not have a history of alcohol use. ?The patient does not have a history of early familial atrial fibrillation or other arrhythmias. ? ?he has a BMI of There is no height or weight on file to calculate BMI.Marland Kitchen ?There were no vitals filed for this visit. ? ? ? ?Family History  ?Problem Relation Age of Onset  ? Heart disease Mother   ?     age 86   ? Throat cancer Father 8  ? Cancer Father   ?     throat   ? Cancer Maternal Uncle   ?     uncle ?maternal or paternal died colon cancer in his 7s   ? Diabetes Maternal Aunt   ? Heart attack Maternal Grandmother   ? ? ? ?Atrial Fibrillation Management history: ? ?Previous antiarrhythmic drugs: none ?Previous cardioversions: 10/19/21 ?Previous  ablations: none ?CHADS2VASC score: 3 ?Anticoagulation history: Eliquis ? ? ?Past Medical History:  ?Diagnosis Date  ? Allergy   ? Arthritis   ? Carpal tunnel syndrome on left   ? Chest pain of uncertain etiology [id number]  ? COVID-19   ? 07/07/19  ? CTS (carpal tunnel syndrome)   ? left  ? DDD (degenerative disc disease), thoracolumbar   ? unknown location   ? DDD (degenerative disc disease), thoracolumbar   ? multilevel  ? Degenerative joint disease of left shoulder   ? Degenerative joint disease of left shoulder   ? 02/2011   ? Diverticulosis   ? left colon (2008)   ? ED (erectile dysfunction)   ? 02/2011   ? ED (erectile dysfunction)   ? GERD (gastroesophageal reflux disease)   ? H. pylori infection   ? H. pylori infection   ? Hyperlipidemia   ? Hypertension   ? Insomnia   ? Lactose intolerance   ? Lactose intolerance   ? Low back pain   ? PVC (premature ventricular contraction) 10/24/2019  ? Shingles   ? 07/2018  ? Smoking   ? smoking since age 10 y.o  ? Toe fracture, left   ? 4th in 2014  ? Vitamin D deficiency   ? Vitamin D deficiency   ? ?Past Surgical History:  ?Procedure Laterality Date  ? APPENDECTOMY    ? BACK SURGERY    ? x 2 [id number] and 1995 in [place]   ? CARDIAC CATHETERIZATION    ?  1999 nl  ? CARDIOVERSION N/A 10/19/2021  ? Procedure: CARDIOVERSION;  Surgeon: Werner Lean, MD;  Location: [facility] ENDOSCOPY;  Service: Cardiovascular;  Laterality: N/A;  ? CARPAL TUNNEL RELEASE Left   ? COLONOSCOPY    ? 2008 diverticulosis  ? ESOPHAGOGASTRODUODENOSCOPY    ? h/o +h. pylori  ? OTHER SURGICAL HISTORY    ? heart catherization 1999 normal   ? SPINE SURGERY    ? x 2   ? ? ?Current Outpatient Medications  ?Medication Sig Dispense Refill  ? apixaban (ELIQUIS) 5 MG TABS tablet Take 1 tablet (5 mg total) by mouth 2 (two) times daily. 60 tablet 3  ? Cholecalciferol (VITAMIN D-3) 125 MCG (5000 UT) TABS Take 1 tablet by mouth daily.    ? diltiazem (CARDIZEM CD) 120 MG 24 hr capsule Take 1 capsule (120 mg total) by mouth  daily. 30 capsule 3  ? ezetimibe (ZETIA) 10 MG tablet Take 1 tablet (10 mg total) by mouth daily. (Patient not taking: Reported on 10/12/2021) 90 tablet 3  ? ferrous sulfate 325 (65 FE) MG tablet Take 325 mg by mouth daily with breakfast.    ? gabapentin (NEURONTIN) 300 MG capsule Take 3 capsules (900 mg total) by mouth at bedtime. (Patient taking differently: Take 300-600 mg by mouth See admin instructions. Take 300 mg in the morning and 600 mg at bedtime) 180 capsule 3  ? losartan-hydrochlorothiazide (HYZAAR) 100-12.5 MG tablet Take 1 tablet by mouth daily. In am 90 tablet 3  ? Multiple Vitamin (MULTIVITAMIN) tablet Take 1 tablet by mouth daily.    ? pantoprazole (PROTONIX) 40 MG tablet Take 1 tablet (40 mg total) by mouth daily. 30 min before food 90 tablet 3  ? ?No current facility-administered medications for this visit.  ? ? ?Allergies  ?Allergen Reactions  ? Crestor [Rosuvastatin Calcium] Other (See Comments)  ?  Elevated muscle enzymes ?  ? Lipitor [Atorvastatin Calcium] Other (See Comments)  ?  Elevated muscle enzymes ?  ? Tomato Itching  ? ? ?Social History  ? ?Socioeconomic History  ? Marital status: Married  ?  Spouse name: Not on file  ? Number of children: Not on file  ? Years of education: Not on file  ? Highest education level: Not on file  ?Occupational History  ? Not on file  ?Tobacco Use  ? Smoking status: Former  ?  Packs/day: 0.50  ?  Types: Cigarettes  ?  Start date: 07/06/1971  ?  Quit date: 06/26/2020  ?  Years since quitting: 1.3  ? Smokeless tobacco: Never  ? Tobacco comments:  ?  Former smoker 09/24/21  ?Substance and Sexual Activity  ? Alcohol use: No  ?  Alcohol/week: 0.0 standard drinks  ? Drug use: No  ? Sexual activity: Not on file  ?Other Topics Concern  ? Not on file  ?Social History Narrative  ? Married   ? 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 On child   ? 1 son, 1 daughter  ? 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 Wears seat belt, safe in relationship   ? Smoker   ? Retired 06/27/2019  ? ?Social Determinants of  Health  ? ?Financial Resource Strain: Not on file  ?Food Insecurity: Not on file  ?Transportation Needs: Not on file  ?Physical Activity: Not on file  ?Stress: Not on file  ?Social Connections: Not on file  ?Intimate Partner Violence: Not on file  ? ? ? ?ROS- All systems are reviewed and  negative except as per the HPI above. ? ?Physical Exam: ?There were no vitals filed for this visit. ? ? ?GEN- The patient is a well appearing *** {Desc; male/male:[id number]}, alert and oriented x 3 today.   ?HEENT-head normocephalic, atraumatic, sclera clear, conjunctiva pink, hearing intact, trachea midline. ?Lungs- Clear to ausculation bilaterally, normal work of breathing ?Heart- ***Regular rate and rhythm, no murmurs, rubs or gallops  ?GI- soft, NT, ND, + BS ?Extremities- no clubbing, cyanosis, or edema ?MS- no significant deformity or atrophy ?Skin- no rash or lesion ?Psych- euthymic mood, full affect ?Neuro- strength and sensation are intact ? ? ?Wt Readings from Last 3 Encounters:  ?10/19/21 114.8 kg  ?10/01/21 116.4 kg  ?09/24/21 114.9 kg  ? ? ?EKG today demonstrates  ?*** ? ? ?Epic records are reviewed at length today ? ?CHA2DS2-VASc Score = 3  ?The patient's score is based upon: ?CHF History: 0 ?HTN History: 1 ?Diabetes History: 0 ?Stroke History: 0 ?Vascular Disease History: 1 ?Age Score: 1 ?Gender Score: 0 ?    ? ? ?ASSESSMENT AND PLAN: ?1. Typical Atrial flutter ?The patient's CHA2DS2-VASc score is 3, indicating a 3.2% annual risk of stroke.   ?S/p DCCV on 10/19/21. ?***  ?Continue Eliquis 5 mg BID ?Continue diltiazem 120 mg daily ?Recall patient would like to try medications before considering ablation.  ? ?2. Secondary Hypercoagulable State (ICD10:  D68.69) ?The patient is at significant risk for stroke/thromboembolism based upon his CHA2DS2-VASc Score of 3.  Continue Apixaban (Eliquis).  ? ?3. CAD/aortic atherosclerosis  ?On statin ?No anginal symptoms. ?*** ? ?4. Snoring/witnessed apnea  ?Patient deferred sleep  study. ?*** ? ?5. HTN ?Stable, no changes today. ?*** ? ? ?Follow up ***with Dr Oval Linsey per recall.  ? ? ?Ricky Olin Gurski PA-C ?[facility] ?[facility] ?[address] ?[place], [place] [address] ?[phone]

## 2021-11-02 DIAGNOSIS — M7062 Trochanteric bursitis, left hip: Secondary | ICD-10-CM | POA: Diagnosis not present

## 2021-11-16 ENCOUNTER — Ambulatory Visit (HOSPITAL_COMMUNITY)
Admission: RE | Admit: 2021-11-16 | Discharge: 2021-11-16 | Disposition: A | Discharge status: Home / Self Care | Payer: PPO | Source: Ambulatory Visit | Attending: Physician Assistant | Admitting: Physician Assistant

## 2021-11-16 VITALS — BP 136/76 | HR 71 | Ht >= 80 in | Wt 251.0 lb

## 2021-11-16 DIAGNOSIS — I10 Essential (primary) hypertension: Secondary | ICD-10-CM | POA: Insufficient documentation

## 2021-11-16 DIAGNOSIS — K254 Chronic or unspecified gastric ulcer with hemorrhage: Secondary | ICD-10-CM | POA: Insufficient documentation

## 2021-11-16 DIAGNOSIS — R0683 Snoring: Secondary | ICD-10-CM | POA: Insufficient documentation

## 2021-11-16 DIAGNOSIS — F17200 Nicotine dependence, unspecified, uncomplicated: Secondary | ICD-10-CM | POA: Diagnosis not present

## 2021-11-16 DIAGNOSIS — I700 Atherosclerosis of aorta: Secondary | ICD-10-CM | POA: Insufficient documentation

## 2021-11-16 DIAGNOSIS — I483 Typical atrial flutter: Secondary | ICD-10-CM | POA: Diagnosis not present

## 2021-11-16 DIAGNOSIS — E785 Hyperlipidemia, unspecified: Secondary | ICD-10-CM | POA: Diagnosis not present

## 2021-11-16 DIAGNOSIS — Z7901 Long term (current) use of anticoagulants: Secondary | ICD-10-CM | POA: Insufficient documentation

## 2021-11-16 DIAGNOSIS — D6869 Other thrombophilia: Secondary | ICD-10-CM | POA: Diagnosis not present

## 2021-11-16 DIAGNOSIS — I2510 Atherosclerotic heart disease of native coronary artery without angina pectoris: Secondary | ICD-10-CM | POA: Insufficient documentation

## 2021-11-16 NOTE — Progress Notes (Signed)
? ? ?Primary Care Physician: [name], [name], MD ?Primary Cardiologist: Dr Oval Linsey ?Primary Electrophysiologist: none ?Referring Physician: Dr [place]/Dr [name]  ? ? ?John Keith is a 71 y.o. male with a history of HTN, HLD, tobacco abuse, aortic atherosclerosis, prior gastric ulcer/GI bleed, atrial flutter who presents for follow up in the [facility].  The patient was initially diagnosed with atrial flutter after presenting to his PCP with symptoms of dizziness and palpitations starting 09/22/21. ECG at that visit showed atrial flutter. Patient has a CHADS2VASC score of 3. There were no specific triggers that he could identify. He does admit to snoring and witnessed apnea.  ? ?On follow up today, patient is s/p DCCV on 10/19/21. Patient reports that he has continued to have intermittent lightheadedness since DCCV, no palpitations. He is in SR today. He also had chest discomfort with activity. This has been chronic for years, CT in 2021 showed minimal nonobstructive CAD.  ? ?Today, he denies symptoms of palpitations, shortness of breath, orthopnea, PND, lower extremity edema, presyncope, syncope, snoring, daytime somnolence, bleeding, or neurologic sequela. The patient is tolerating medications without difficulties and is otherwise without complaint today.  ? ? ?Atrial Fibrillation Risk Factors: ? ?he does have symptoms or diagnosis of sleep apnea. ?he does not have a history of rheumatic fever. ?he does not have a history of alcohol use. ?The patient does not have a history of early familial atrial fibrillation or other arrhythmias. ? ?he has a BMI of Body mass index is 26.9 kg/m?Marland KitchenMarland Kitchen ?Filed Weights  ? 11/16/21 0925  ?Weight: 113.9 kg  ? ? ? ?Family History  ?Problem Relation Age of Onset  ? Heart disease Mother   ?     age 48   ? Throat cancer Father 3  ? Cancer Father   ?     throat   ? Cancer Maternal Uncle   ?     uncle ?maternal or paternal died colon cancer in  his 24s   ? Diabetes Maternal Aunt   ? Heart attack Maternal Grandmother   ? ? ? ?Atrial Fibrillation Management history: ? ?Previous antiarrhythmic drugs: none ?Previous cardioversions: 10/19/21 ?Previous ablations: none ?CHADS2VASC score: 3 ?Anticoagulation history: Eliquis ? ? ?Past Medical History:  ?Diagnosis Date  ? Allergy   ? Arthritis   ? Carpal tunnel syndrome on left   ? Chest pain of uncertain etiology [id number]  ? COVID-19   ? 07/07/19  ? CTS (carpal tunnel syndrome)   ? left  ? DDD (degenerative disc disease), thoracolumbar   ? unknown location   ? DDD (degenerative disc disease), thoracolumbar   ? multilevel  ? Degenerative joint disease of left shoulder   ? Degenerative joint disease of left shoulder   ? 02/2011   ? Diverticulosis   ? left colon (2008)   ? ED (erectile dysfunction)   ? 02/2011   ? ED (erectile dysfunction)   ? GERD (gastroesophageal reflux disease)   ? H. pylori infection   ? H. pylori infection   ? Hyperlipidemia   ? Hypertension   ? Insomnia   ? Lactose intolerance   ? Lactose intolerance   ? Low back pain   ? PVC (premature ventricular contraction) 10/24/2019  ? Shingles   ? 07/2018  ? Smoking   ? smoking since age 66 y.o  ? Toe fracture, left   ? 4th in 2014  ? Vitamin D deficiency   ? Vitamin D deficiency   ? ?  Past Surgical History:  ?Procedure Laterality Date  ? APPENDECTOMY    ? BACK SURGERY    ? x 2 [id number] and 1995 in [place]   ? CARDIAC CATHETERIZATION    ? 1999 nl  ? CARDIOVERSION N/A 10/19/2021  ? Procedure: CARDIOVERSION;  Surgeon: Werner Lean, MD;  Location: [facility];  Service: Cardiovascular;  Laterality: N/A;  ? CARPAL TUNNEL RELEASE Left   ? COLONOSCOPY    ? 2008 diverticulosis  ? ESOPHAGOGASTRODUODENOSCOPY    ? h/o +h. pylori  ? OTHER SURGICAL HISTORY    ? heart catherization 1999 normal   ? SPINE SURGERY    ? x 2   ? ? ?Current Outpatient Medications  ?Medication Sig Dispense Refill  ? apixaban (ELIQUIS) 5 MG TABS tablet Take 1 tablet (5 mg total) by mouth 2  (two) times daily. 60 tablet 3  ? Cholecalciferol (VITAMIN D-3) 125 MCG (5000 UT) TABS Take 1 tablet by mouth daily.    ? diltiazem (CARDIZEM CD) 120 MG 24 hr capsule Take 1 capsule (120 mg total) by mouth daily. 30 capsule 3  ? ezetimibe (ZETIA) 10 MG tablet Take 1 tablet (10 mg total) by mouth daily. 90 tablet 3  ? ferrous sulfate 325 (65 FE) MG tablet Take 325 mg by mouth daily with breakfast.    ? gabapentin (NEURONTIN) 300 MG capsule Take 3 capsules (900 mg total) by mouth at bedtime. (Patient taking differently: Take 300-600 mg by mouth See admin instructions. Take 300 mg in the morning and 600 mg at bedtime) 180 capsule 3  ? losartan-hydrochlorothiazide (HYZAAR) 100-12.5 MG tablet Take 1 tablet by mouth daily. In am 90 tablet 3  ? Multiple Vitamin (MULTIVITAMIN) tablet Take 1 tablet by mouth daily.    ? pantoprazole (PROTONIX) 40 MG tablet Take 1 tablet (40 mg total) by mouth daily. 30 min before food 90 tablet 3  ? ?No current facility-administered medications for this encounter.  ? ? ?Allergies  ?Allergen Reactions  ? Crestor [Rosuvastatin Calcium] Other (See Comments)  ?  Elevated muscle enzymes ?  ? Lipitor [Atorvastatin Calcium] Other (See Comments)  ?  Elevated muscle enzymes ?  ? Tomato Itching  ? ? ?Social History  ? ?Socioeconomic History  ? Marital status: Married  ?  Spouse name: Not on file  ? Number of children: Not on file  ? Years of education: Not on file  ? Highest education level: Not on file  ?Occupational History  ? Not on file  ?Tobacco Use  ? Smoking status: Former  ?  Packs/day: 0.50  ?  Types: Cigarettes  ?  Start date: 07/06/1971  ?  Quit date: 06/26/2020  ?  Years since quitting: 1.3  ? Smokeless tobacco: Never  ? Tobacco comments:  ?  Former smoker 09/24/21  ?Substance and Sexual Activity  ? Alcohol use: No  ?  Alcohol/week: 0.0 standard drinks  ? Drug use: No  ? Sexual activity: Not on file  ?Other Topics Concern  ? Not on file  ?Social History Narrative  ? Married   ? 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    ? On child   ? 1 son, 1 daughter  ? 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 Wears seat belt, safe in relationship   ? Smoker   ? Retired 06/27/2019  ? ?Social Determinants of Health  ? ?Financial Resource Strain: Not on file  ?Food Insecurity: Not on file  ?Transportation Needs: Not on file  ?Physical Activity: Not  on file  ?Stress: Not on file  ?Social Connections: Not on file  ?Intimate Partner Violence: Not on file  ? ? ? ?ROS- All systems are reviewed and negative except as per the HPI above. ? ?Physical Exam: ?Vitals:  ? 11/16/21 0925  ?BP: 136/76  ?Pulse: 71  ?Weight: 113.9 kg  ?Height: 6\' 9"  (2.057 m)  ? ? ? ?GEN- The patient is a well appearing male, alert and oriented x 3 today.   ?HEENT-head normocephalic, atraumatic, sclera clear, conjunctiva pink, hearing intact, trachea midline. ?Lungs- Clear to ausculation bilaterally, normal work of breathing ?Heart- Regular rate and rhythm, no murmurs, rubs or gallops  ?GI- soft, NT, ND, + BS ?Extremities- no clubbing, cyanosis, or edema ?MS- no significant deformity or atrophy ?Skin- no rash or lesion ?Psych- euthymic mood, full affect ?Neuro- strength and sensation are intact ? ? ?Wt Readings from Last 3 Encounters:  ?11/16/21 113.9 kg  ?10/19/21 114.8 kg  ?10/01/21 116.4 kg  ? ? ?EKG today demonstrates  ?SR, NST ?Vent. rate 71 BPM ?PR interval 178 ms ?QRS duration 112 ms ?QT/QTcB 392/425 ms ? ? ?Epic records are reviewed at length today ? ?CHA2DS2-VASc Score = 3  ?The patient's score is based upon: ?CHF History: 0 ?HTN History: 1 ?Diabetes History: 0 ?Stroke History: 0 ?Vascular Disease History: 1 ?Age Score: 1 ?Gender Score: 0 ?    ? ? ?ASSESSMENT AND PLAN: ?1. Typical Atrial flutter ?The patient's CHA2DS2-VASc score is 3, indicating a 3.2% annual risk of stroke.   ?S/p DCCV on 10/19/21. ?Patient in [place] today but has ongoing symptoms of intermittent lightheadedness. Possible he is having paroxysms of flutter or afib. Will have him wear a 14 day Zio monitor to evaluate.   ?Continue Eliquis 5 mg BID ?Continue diltiazem 120 mg daily  ? ?2. Secondary Hypercoagulable State (ICD10:  D68.69) ?The patient is at significant risk for stroke/thromboembolism based upon his CHA2DS2-VAS

## 2021-11-18 ENCOUNTER — Telehealth: Payer: Self-pay | Admitting: Internal Medicine

## 2021-11-18 NOTE — Telephone Encounter (Signed)
Do you want him to see you or EP for atrial flutter he wants to see you for appt as requested  ? ?Thank you ? ?

## 2021-11-18 NOTE — Telephone Encounter (Signed)
Pt wants a referral to cardiologist and wants it to be scheduled on a Monday or Thursday  ?

## 2021-11-23 ENCOUNTER — Telehealth: Payer: Self-pay | Admitting: Family

## 2021-11-23 NOTE — Telephone Encounter (Signed)
RN returned call to patient. On 5/19 patient was standing out on back porch and bent over to tie shoes and then  doesn't remember anything after that. Did not check blood pressure any. Says that he feels this way just feels way sometimes. Lightheaded feeling presents for 3 weeks.   Checks his BP, but not today last office visit BP was 136/76 and states this is about his normal.    Routing to Dr. Duke Salvia primary cards and Alphonzo Severance, PA who saw the patient last.

## 2021-11-23 NOTE — Telephone Encounter (Signed)
RN returned call to patient to provide the following recommendation. Patient verified that he did not hit his head during his fall        "At recent office visit zio monitor was ordered since Patient in SR today but has ongoing symptoms of intermittent lightheadedness. Possible he is having paroxysms of flutter or afib. - wearing monitor to correlate his symptoms to rhythm/rate. Wanted to ensure pt did not his head when he had "black out spell". Ricky recommended no medication changes since BP stable and wearing heart monitor."

## 2021-11-23 NOTE — Telephone Encounter (Signed)
Pt c/o Syncope: STAT if syncope occurred within 30 minutes and pt complains of lightheadedness High Priority if episode of passing out, completely, today or in last 24 hours   Did you pass out today? No    When is the last time you passed out? 11/20/21   Has this occurred multiple times? No    Did you have any symptoms prior to passing out? Lightheadedness, dizziness, sweating, weakness

## 2021-12-03 ENCOUNTER — Ambulatory Visit (HOSPITAL_BASED_OUTPATIENT_CLINIC_OR_DEPARTMENT_OTHER): Payer: PPO | Admitting: Family

## 2021-12-03 ENCOUNTER — Encounter (HOSPITAL_BASED_OUTPATIENT_CLINIC_OR_DEPARTMENT_OTHER): Payer: Self-pay | Admitting: Family

## 2021-12-03 VITALS — BP 120/78 | HR 75 | Ht >= 80 in | Wt 253.8 lb

## 2021-12-03 DIAGNOSIS — D6859 Other primary thrombophilia: Secondary | ICD-10-CM

## 2021-12-03 DIAGNOSIS — R0683 Snoring: Secondary | ICD-10-CM

## 2021-12-03 DIAGNOSIS — R55 Syncope and collapse: Secondary | ICD-10-CM

## 2021-12-03 DIAGNOSIS — I10 Essential (primary) hypertension: Secondary | ICD-10-CM | POA: Diagnosis not present

## 2021-12-03 MED ORDER — LOSARTAN POTASSIUM-HCTZ 100-12.5 MG PO TABS: 0.5000 | ORAL_TABLET | Freq: Every day | ORAL | 3 refills | Status: DC

## 2021-12-03 NOTE — Patient Instructions (Signed)
Medication Instructions:  Your physician has recommended you make the following change in your medication:   REDUCE Losartan-HCTZ to half tablet daily  *If you need a refill on your cardiac medications before your next appointment, please call your pharmacy*   Lab Work: Your physician recommends that you return for lab work in: CBC, BMP, TSH  If you have labs (blood work) drawn today and your tests are completely normal, you will receive your results only by: MyChart Message (if you have MyChart) OR A paper copy in the mail If you have any lab test that is abnormal or we need to change your treatment, we will call you to review the results.   Testing/Procedures: Your physician has requested that you have an echocardiogram. Echocardiography is a painless test that uses sound waves to create images of your heart. It provides your doctor with information about the size and shape of your heart and how well your heart's chambers and valves are working. This procedure takes approximately one hour. There are no restrictions for this procedure.   Your physician has requested that you have a carotid duplex. This test is an ultrasound of the carotid arteries in your neck. It looks at blood flow through these arteries that supply the brain with blood. Allow one hour for this exam. There are no restrictions or special instructions.   Follow-Up: At [facility], you and your health needs are our priority.  As part of our continuing mission to provide you with exceptional heart care, we have created designated Provider Care Teams.  These Care Teams include your primary Cardiologist (physician) and Advanced Practice Providers (APPs -  Physician Assistants and Nurse Practitioners) who all work together to provide you with the care you need, when you need it.  We recommend signing up for the patient portal called "MyChart".  Sign up information is provided on this After Visit Summary.  MyChart is used to  connect with patients for Virtual Visits (Telemedicine).  Patients are able to view lab/test results, encounter notes, upcoming appointments, etc.  Non-urgent messages can be sent to your provider as well.   To learn more about what you can do with MyChart, go to [email or web address].    Your next appointment:   4-6 weeks  The format for your next appointment:   In Person  Provider:   Chilton Si, MD or Alver Sorrow, NP     Other Instructions  Heart Healthy Diet Recommendations: A low-salt diet is recommended. Meats should be grilled, baked, or boiled. Avoid fried foods. Focus on lean protein sources like fish or chicken with vegetables and fruits. The American Heart Association is a Chief Technology Officer!  American Heart Association Diet and Lifeystyle Recommendations   Exercise recommendations: The American Heart Association recommends 150 minutes of moderate intensity exercise weekly. Try 30 minutes of moderate intensity exercise 4-5 times per week. This could include walking, jogging, or swimming.    Important Information About Sugar

## 2021-12-03 NOTE — Progress Notes (Signed)
Office Visit    Patient Name: John Keith Date of Encounter: 12/03/2021  PCP:  [name], Pasty Spillers, MD   [facility]  Cardiologist:  Chilton Si, MD  Advanced Practice Provider:  No care team member to display Electrophysiologist:  None    Chief Complaint    John Keith is a 71 y.o. male with a hx of hypertension, hyperlipidemia, tobacco abuse, COVID-19, gastric ulcer/GI bleed, aortic atherosclerosis, atrial flutter presents today for syncope  Past Medical History    Past Medical History:  Diagnosis Date   Allergy    Arthritis    Carpal tunnel syndrome on left    Chest pain of uncertain etiology 10/24/2019   COVID-19    07/07/19   CTS (carpal tunnel syndrome)    left   DDD (degenerative disc disease), thoracolumbar    unknown location    DDD (degenerative disc disease), thoracolumbar    multilevel   Degenerative joint disease of left shoulder    Degenerative joint disease of left shoulder    02/2011    Diverticulosis    left colon (2008)    ED (erectile dysfunction)    02/2011    ED (erectile dysfunction)    GERD (gastroesophageal reflux disease)    H. pylori infection    H. pylori infection    Hyperlipidemia    Hypertension    Insomnia    Lactose intolerance    Lactose intolerance    Low back pain    PVC (premature ventricular contraction) 10/24/2019   Shingles    07/2018   Smoking    smoking since age 69 y.o   Toe fracture, left    4th in 2014   Vitamin D deficiency    Vitamin D deficiency    Past Surgical History:  Procedure Laterality Date   APPENDECTOMY     BACK SURGERY     x 2 10991 and 1995 in [place]    CARDIAC CATHETERIZATION     1999 nl   CARDIOVERSION N/A 10/19/2021   Procedure: CARDIOVERSION;  Surgeon: Christell Constant, MD;  Location: [facility];  Service: Cardiovascular;  Laterality: N/A;   CARPAL TUNNEL RELEASE Left    COLONOSCOPY     2008 diverticulosis   ESOPHAGOGASTRODUODENOSCOPY      h/o +h. pylori   OTHER SURGICAL HISTORY     heart catherization 1999 normal    SPINE SURGERY     x 2     Allergies  Allergies  Allergen Reactions   Crestor [Rosuvastatin Calcium] Other (See Comments)    Elevated muscle enzymes    Lipitor [Atorvastatin Calcium] Other (See Comments)    Elevated muscle enzymes    Tomato Itching    History of Present Illness    John Keith is a 71 y.o. male with a hx of hypertension, hyperlipidemia, tobacco abuse, COVID-19, gastric ulcer/GI bleed, aortic atherosclerosis, atrial flutter last seen by atrial fibrillation clinic.  He was diagnosed with atrial flutter 09/24/2021 after presenting to his primary care provider complaining of dizziness and palpitations.  He was referred to the atrial fibrillation clinic and evaluated 10/01/2021.  Eliquis 5 mg twice daily as well as diltiazem 120 mg daily initiated.  Amlodipine was held due to initiation of diltiazem.  He underwent cardioversion 10/19/2021.  Follow-up with atrial flutter clinic 11/16/2021 noted continued intermittent lightheadedness since cardioversion though no palpitations.  He was in sinus rhythm at the time.  14-day ZIO was placed.  Presents today for  follow-up with his wife and daughter. Tells me after his cardioversion did not feel any better.  Notes being lightheaded. A couple weeks ago he blacked out.  He had recurrent episode yesterday and the day before yesterday. He tells me this occurred very suddenly but did have a lightheadedness or almost as if the room was spinning feeling prior.. Loss of consciousness for a couple seconds which was witnessed.  Daughter tells me he he gets a "look "and that he starts swelling in mention she felt his heart beating quickly and that he might pass out.  Happened both standing and sitting.  Checks blood pressure seldom at home. Denies vision changes.  Notes worsening dyspnea on exertion since 2021 we had COVID which is overall unchanged.  He had 3 months/day.   He drinks 1 cup of coffee in the morning and then decaffeinated soda or water throughout the day.  Usually 2-3 bottles of water each day.  Denies chest pain, pressure, tightness.  No orthopnea, PND, edema.   Orthostatic VS for the past 24 hrs (Last 3 readings):  BP- Lying Pulse- Lying BP- Sitting Pulse- Sitting BP- Standing at 0 minutes Pulse- Standing at 0 minutes BP- Standing at 3 minutes Pulse- Standing at 3 minutes  12/03/21 1000 107/70 69 103/70 88 120/81 97 129/84 96     EKGs/Labs/Other Studies Reviewed:   The following studies were reviewed today:   EKG:  EKG is ordered today.  The ekg ordered today demonstrates SR 75 bpm with single PVC, TWI noted V5-6 which is unchanged from prior  Recent Labs: 05/15/2021: TSH 0.88 08/27/2021: ALT 27; Magnesium 2.2 10/01/2021: BUN 15; Creatinine, Ser 1.30; Hemoglobin 12.3; Platelets 266; Potassium 3.5; Sodium 142  Recent Lipid Panel    Component Value Date/Time   CHOL 171 08/27/2021 1215   TRIG 179.0 (H) 08/27/2021 1215   HDL 48.70 08/27/2021 1215   CHOLHDL 4 08/27/2021 1215   VLDL 35.8 08/27/2021 1215   LDLCALC 87 08/27/2021 1215   LDLDIRECT 108.0 05/15/2021 1403    Risk Assessment/Calculations:   CHA2DS2-VASc Score = 3   This indicates a 3.2% annual risk of stroke. The patient's score is based upon: CHF History: 0 HTN History: 1 Diabetes History: 0 Stroke History: 0 Vascular Disease History: 1 Age Score: 1 Gender Score: 0     Home Medications   Current Meds  Medication Sig   apixaban (ELIQUIS) 5 MG TABS tablet Take 1 tablet (5 mg total) by mouth 2 (two) times daily.   Cholecalciferol (VITAMIN D-3) 125 MCG (5000 UT) TABS Take 1 tablet by mouth daily.   diltiazem (CARDIZEM CD) 120 MG 24 hr capsule Take 1 capsule (120 mg total) by mouth daily.   ezetimibe (ZETIA) 10 MG tablet Take 1 tablet (10 mg total) by mouth daily.   ferrous sulfate 325 (65 FE) MG tablet Take 325 mg by mouth daily with breakfast.   gabapentin  (NEURONTIN) 300 MG capsule Take 3 capsules (900 mg total) by mouth at bedtime. (Patient taking differently: Take 300-600 mg by mouth See admin instructions. Take 300 mg in the morning and 600 mg at bedtime)   losartan-hydrochlorothiazide (HYZAAR) 100-12.5 MG tablet Take 1 tablet by mouth daily. In am   Multiple Vitamin (MULTIVITAMIN) tablet Take 1 tablet by mouth daily.   pantoprazole (PROTONIX) 40 MG tablet Take 1 tablet (40 mg total) by mouth daily. 30 min before food     Review of Systems      All other systems reviewed and  are otherwise negative except as noted above.  Physical Exam    VS:  BP 120/78 (BP Location: Left Arm, Patient Position: Sitting, Cuff Size: Large)   Pulse 75   Ht 6\' 9"  (2.057 m)   Wt 253 lb 12.8 oz (115.1 kg)   BMI 27.20 kg/m  , BMI Body mass index is 27.2 kg/m.  Wt Readings from Last 3 Encounters:  12/03/21 253 lb 12.8 oz (115.1 kg)  11/16/21 251 lb (113.9 kg)  10/19/21 253 lb (114.8 kg)     GEN: Well nourished, well developed, in no acute distress. HEENT: normal. Neck: Supple, no JVD, carotid bruits, or masses. Cardiac: RRR, no murmurs, rubs, or gallops. No clubbing, cyanosis, edema.  Radials/PT 2+ and equal bilaterally.  Respiratory:  Respirations regular and unlabored, clear to auscultation bilaterally. GI: Soft, nontender, nondistended. MS: No deformity or atrophy. Skin: Warm and dry, no rash. Neuro:  Strength and sensation are intact. Psych: Normal affect.  Assessment & Plan    Syncope -3 episodes over the last 3 weeks.  Associated lightheadedness, dizziness.  Associated with loss of consciousness for few seconds which has been witnessed, no injury. No amaurosis fugax, vision change, nor incontinence. Occurred while sitting and standing. Consider etiology hypotension, post conversion pause, valvular abnormality, carotid artery stenosis.  Reduce Hyzaar to half tablet daily.  Recently completed monitor and ZIO is agreeable to expedite processing.  CBC, BMP, TSH today. Echo and carotid duplex ordered.  Reviewed [place] guidelines do not drive for 6 months.  Atrial flutter -maintaining sinus rhythm today.  Recently completed monitor.  Given his recent syncope and concern for postconversion pause we have asked to ZIO to expedite processing of his monitor.  Continue diltiazem 120 mg daily.  If persistent atrial flutter may consider AAD with atrial fibrillation clinic.  Hypercoagulable state -denies hematuria, melena. CBC today for monitoring.  CHA2DS2-VASc Score = 3 [CHF History: 0, HTN History: 1, Diabetes History: 0, Stroke History: 0, Vascular Disease History: 1, Age Score: 1, Gender Score: 0].  Therefore, the patient's annual risk of stroke is 3.2 %.    Continue Eliquis 5 mg twice daily.  Does not meet dose reduction criteria.  CAD / Aortic atherosclerosis - Stable with no anginal symptoms. No indication for ischemic evaluation.  GDMT includes Zetia.  Previous statin intolerance with elevated muscle enzymes.  No aspirin due to chronic anticoagulation.Heart healthy diet and regular cardiovascular exercise encouraged.    Snores -previously deferred sleep study.  Discussed again at follow-up to reiterate the importance of management of sleep apnea due to arrhythmia.  HTN -concerned that hypotension is contributing to lightheadedness, syncope.  S Blood pressure at home.  Reduce Hyzaar to half tablet daily.     Disposition: Follow up in 1 month(s) with [name], MD or APP.  Signed, Loel Dubonnet, NP 12/03/2021, 10:27 AM [place]

## 2021-12-04 ENCOUNTER — Telehealth (HOSPITAL_BASED_OUTPATIENT_CLINIC_OR_DEPARTMENT_OTHER): Payer: Self-pay

## 2021-12-04 LAB — BASIC METABOLIC PANEL
BUN/Creatinine Ratio: 10 (ref 10–24)
BUN: 9 mg/dL (ref 8–27)
CO2: 22 mmol/L (ref 20–29)
Calcium: 9.4 mg/dL (ref 8.6–10.2)
Chloride: 99 mmol/L (ref 96–106)
Creatinine, Ser: 0.91 mg/dL (ref 0.76–1.27)
Glucose: 87 mg/dL (ref 70–99)
Potassium: 4.1 mmol/L (ref 3.5–5.2)
Sodium: 137 mmol/L (ref 134–144)
eGFR: 90 mL/min/{1.73_m2} (ref >59)

## 2021-12-04 LAB — CBC
Hematocrit: 38.7 % (ref 37.5–51.0)
Hemoglobin: 13.6 g/dL (ref 13.0–17.7)
MCH: 31.7 pg (ref 26.6–33.0)
MCHC: 35.1 g/dL (ref 31.5–35.7)
MCV: 90 fL (ref 79–97)
Platelets: 314 10*3/uL (ref 150–450)
RBC: 4.29 x10E6/uL (ref 4.14–5.80)
RDW: 11.6 % (ref 11.6–15.4)
WBC: 5.8 10*3/uL (ref 3.4–10.8)

## 2021-12-04 LAB — TSH: TSH: 1.16 u[IU]/mL (ref 0.450–4.500)

## 2021-12-04 NOTE — Telephone Encounter (Addendum)
Results called to patient who verbalizes understanding!      ----- Message from Alver Sorrow, NP sent at 12/04/2021  7:57 AM EDT ----- Normal kidney function and electrolytes.  CBC with no evidence of anemia nor infection.  Normal thyroid function.  Good result!

## 2021-12-07 ENCOUNTER — Telehealth (HOSPITAL_COMMUNITY): Payer: Self-pay | Admitting: Physician Assistant

## 2021-12-07 ENCOUNTER — Inpatient Hospital Stay (HOSPITAL_COMMUNITY)
Admission: EM | Admit: 2021-12-07 | Discharge: 2021-12-11 | DRG: 225 | Disposition: A | Discharge status: Home / Self Care | Payer: PPO | Attending: Internal Medicine | Admitting: Internal Medicine

## 2021-12-07 ENCOUNTER — Encounter (HOSPITAL_COMMUNITY): Payer: Self-pay | Admitting: Emergency Medicine

## 2021-12-07 ENCOUNTER — Emergency Department (HOSPITAL_COMMUNITY): Payer: PPO

## 2021-12-07 DIAGNOSIS — I700 Atherosclerosis of aorta: Secondary | ICD-10-CM | POA: Diagnosis present

## 2021-12-07 DIAGNOSIS — Z79899 Other long term (current) drug therapy: Secondary | ICD-10-CM | POA: Diagnosis not present

## 2021-12-07 DIAGNOSIS — I483 Typical atrial flutter: Secondary | ICD-10-CM | POA: Diagnosis not present

## 2021-12-07 DIAGNOSIS — I2510 Atherosclerotic heart disease of native coronary artery without angina pectoris: Secondary | ICD-10-CM | POA: Diagnosis not present

## 2021-12-07 DIAGNOSIS — Z808 Family history of malignant neoplasm of other organs or systems: Secondary | ICD-10-CM

## 2021-12-07 DIAGNOSIS — I480 Paroxysmal atrial fibrillation: Secondary | ICD-10-CM | POA: Diagnosis not present

## 2021-12-07 DIAGNOSIS — I472 Ventricular tachycardia, unspecified: Principal | ICD-10-CM | POA: Diagnosis present

## 2021-12-07 DIAGNOSIS — Z8616 Personal history of COVID-19: Secondary | ICD-10-CM

## 2021-12-07 DIAGNOSIS — I509 Heart failure, unspecified: Secondary | ICD-10-CM | POA: Diagnosis not present

## 2021-12-07 DIAGNOSIS — R001 Bradycardia, unspecified: Secondary | ICD-10-CM | POA: Diagnosis present

## 2021-12-07 DIAGNOSIS — G5603 Carpal tunnel syndrome, bilateral upper limbs: Secondary | ICD-10-CM | POA: Diagnosis present

## 2021-12-07 DIAGNOSIS — I5022 Chronic systolic (congestive) heart failure: Secondary | ICD-10-CM | POA: Diagnosis present

## 2021-12-07 DIAGNOSIS — Z800 Family history of malignant neoplasm of digestive organs: Secondary | ICD-10-CM | POA: Diagnosis not present

## 2021-12-07 DIAGNOSIS — E854 Organ-limited amyloidosis: Secondary | ICD-10-CM | POA: Diagnosis not present

## 2021-12-07 DIAGNOSIS — R000 Tachycardia, unspecified: Secondary | ICD-10-CM | POA: Diagnosis not present

## 2021-12-07 DIAGNOSIS — R55 Syncope and collapse: Secondary | ICD-10-CM | POA: Diagnosis not present

## 2021-12-07 DIAGNOSIS — Z87891 Personal history of nicotine dependence: Secondary | ICD-10-CM | POA: Diagnosis not present

## 2021-12-07 DIAGNOSIS — K219 Gastro-esophageal reflux disease without esophagitis: Secondary | ICD-10-CM | POA: Diagnosis not present

## 2021-12-07 DIAGNOSIS — Z888 Allergy status to other drugs, medicaments and biological substances status: Secondary | ICD-10-CM | POA: Diagnosis not present

## 2021-12-07 DIAGNOSIS — Z7901 Long term (current) use of anticoagulants: Secondary | ICD-10-CM

## 2021-12-07 DIAGNOSIS — I10 Essential (primary) hypertension: Secondary | ICD-10-CM | POA: Diagnosis not present

## 2021-12-07 DIAGNOSIS — Z95810 Presence of automatic (implantable) cardiac defibrillator: Secondary | ICD-10-CM | POA: Diagnosis not present

## 2021-12-07 DIAGNOSIS — E785 Hyperlipidemia, unspecified: Secondary | ICD-10-CM | POA: Diagnosis present

## 2021-12-07 DIAGNOSIS — R079 Chest pain, unspecified: Secondary | ICD-10-CM | POA: Diagnosis not present

## 2021-12-07 DIAGNOSIS — E559 Vitamin D deficiency, unspecified: Secondary | ICD-10-CM | POA: Diagnosis present

## 2021-12-07 DIAGNOSIS — Z8249 Family history of ischemic heart disease and other diseases of the circulatory system: Secondary | ICD-10-CM

## 2021-12-07 DIAGNOSIS — I428 Other cardiomyopathies: Secondary | ICD-10-CM | POA: Diagnosis present

## 2021-12-07 DIAGNOSIS — Z833 Family history of diabetes mellitus: Secondary | ICD-10-CM

## 2021-12-07 DIAGNOSIS — I110 Hypertensive heart disease with heart failure: Secondary | ICD-10-CM | POA: Diagnosis present

## 2021-12-07 LAB — HEPATIC FUNCTION PANEL
ALT: 28 U/L (ref 0–44)
AST: 38 U/L (ref 15–41)
Albumin: 4.1 g/dL (ref 3.5–5.0)
Alkaline Phosphatase: 92 U/L (ref 38–126)
Bilirubin, Direct: 0.1 mg/dL (ref 0.0–0.2)
Indirect Bilirubin: 0.5 mg/dL (ref 0.3–0.9)
Total Bilirubin: 0.6 mg/dL (ref 0.3–1.2)
Total Protein: 7.5 g/dL (ref 6.5–8.1)

## 2021-12-07 LAB — CBC
HCT: 42.1 % (ref 39.0–52.0)
Hemoglobin: 13.1 g/dL (ref 13.0–17.0)
MCH: 31.2 pg (ref 26.0–34.0)
MCHC: 31.1 g/dL (ref 30.0–36.0)
MCV: 100.2 fL — ABNORMAL HIGH (ref 80.0–100.0)
Platelets: 271 10*3/uL (ref 150–400)
RBC: 4.2 MIL/uL — ABNORMAL LOW (ref 4.22–5.81)
RDW: 12.7 % (ref 11.5–15.5)
WBC: 7.8 10*3/uL (ref 4.0–10.5)
nRBC: 0 % (ref 0.0–0.2)

## 2021-12-07 LAB — TROPONIN I (HIGH SENSITIVITY)
Troponin I (High Sensitivity): 39 ng/L — ABNORMAL HIGH (ref <18)
Troponin I (High Sensitivity): 41 ng/L — ABNORMAL HIGH (ref <18)

## 2021-12-07 LAB — BASIC METABOLIC PANEL
Anion gap: 8 (ref 5–15)
BUN: 17 mg/dL (ref 8–23)
CO2: 26 mmol/L (ref 22–32)
Calcium: 9.5 mg/dL (ref 8.9–10.3)
Chloride: 107 mmol/L (ref 98–111)
Creatinine, Ser: 1.18 mg/dL (ref 0.61–1.24)
GFR, Estimated: >60 mL/min (ref >60)
Glucose, Bld: 89 mg/dL (ref 70–99)
Potassium: 4 mmol/L (ref 3.5–5.1)
Sodium: 141 mmol/L (ref 135–145)

## 2021-12-07 LAB — MAGNESIUM: Magnesium: 2.4 mg/dL (ref 1.7–2.4)

## 2021-12-07 MED ORDER — HEPARIN SODIUM (PORCINE) 5000 UNIT/ML IJ SOLN: 5000.0000 [IU] | Freq: Three times a day (TID) | INTRAMUSCULAR | Status: DC

## 2021-12-07 MED ORDER — SODIUM CHLORIDE 0.9% FLUSH
3.0000 mL | Freq: Two times a day (BID) | INTRAVENOUS | Status: DC
  Administered 2021-12-07: 3 mL via INTRAVENOUS

## 2021-12-07 MED ORDER — SODIUM CHLORIDE 0.9% FLUSH
3.0000 mL | Freq: Two times a day (BID) | INTRAVENOUS | Status: DC
  Administered 2021-12-08 (×2): 3 mL via INTRAVENOUS

## 2021-12-07 MED ORDER — SODIUM CHLORIDE 0.9 % IV SOLN: 250.0000 mL | INTRAVENOUS | Status: DC | PRN

## 2021-12-07 MED ORDER — ASPIRIN 81 MG PO TBEC
81.0000 mg | DELAYED_RELEASE_TABLET | Freq: Every day | ORAL | Status: DC
  Administered 2021-12-07 – 2021-12-11 (×4): 81 mg via ORAL
  Filled 2021-12-07 (×5): qty 1

## 2021-12-07 MED ORDER — SODIUM CHLORIDE 0.9% FLUSH: 3.0000 mL | INTRAVENOUS | Status: DC | PRN

## 2021-12-07 MED ORDER — ONDANSETRON HCL 4 MG PO TABS: 4.0000 mg | ORAL_TABLET | Freq: Four times a day (QID) | ORAL | Status: DC | PRN

## 2021-12-07 MED ORDER — EZETIMIBE 10 MG PO TABS
10.0000 mg | ORAL_TABLET | Freq: Every day | ORAL | Status: DC
  Administered 2021-12-08 – 2021-12-11 (×4): 10 mg via ORAL
  Filled 2021-12-07 (×4): qty 1

## 2021-12-07 MED ORDER — GABAPENTIN 300 MG PO CAPS
300.0000 mg | ORAL_CAPSULE | Freq: Three times a day (TID) | ORAL | Status: DC
  Administered 2021-12-07 – 2021-12-11 (×11): 300 mg via ORAL
  Filled 2021-12-07 (×11): qty 1

## 2021-12-07 MED ORDER — CARVEDILOL 6.25 MG PO TABS
6.2500 mg | ORAL_TABLET | Freq: Two times a day (BID) | ORAL | Status: DC
  Administered 2021-12-07 – 2021-12-11 (×8): 6.25 mg via ORAL
  Filled 2021-12-07: qty 2
  Filled 2021-12-07 (×6): qty 1
  Filled 2021-12-07: qty 2

## 2021-12-07 MED ORDER — PANTOPRAZOLE SODIUM 40 MG PO TBEC
40.0000 mg | DELAYED_RELEASE_TABLET | Freq: Every day | ORAL | Status: DC
  Administered 2021-12-08 – 2021-12-11 (×4): 40 mg via ORAL
  Filled 2021-12-07 (×4): qty 1

## 2021-12-07 MED ORDER — LOSARTAN POTASSIUM 50 MG PO TABS
100.0000 mg | ORAL_TABLET | Freq: Every day | ORAL | Status: DC
  Administered 2021-12-07 – 2021-12-09 (×3): 100 mg via ORAL
  Filled 2021-12-07 (×3): qty 2

## 2021-12-07 MED ORDER — ADULT MULTIVITAMIN W/MINERALS CH
1.0000 | ORAL_TABLET | Freq: Every day | ORAL | Status: DC
  Administered 2021-12-08 – 2021-12-11 (×4): 1 via ORAL
  Filled 2021-12-07 (×4): qty 1

## 2021-12-07 MED ORDER — FERROUS SULFATE 325 (65 FE) MG PO TABS
325.0000 mg | ORAL_TABLET | Freq: Every day | ORAL | Status: DC
  Administered 2021-12-08 – 2021-12-11 (×4): 325 mg via ORAL
  Filled 2021-12-07 (×4): qty 1

## 2021-12-07 MED ORDER — HEPARIN SODIUM (PORCINE) 5000 UNIT/ML IJ SOLN
5000.0000 [IU] | Freq: Three times a day (TID) | INTRAMUSCULAR | Status: DC
Start: 2021-12-07 — End: 2021-12-08
  Administered 2021-12-07 – 2021-12-08 (×2): 5000 [IU] via SUBCUTANEOUS
  Filled 2021-12-07 (×2): qty 1

## 2021-12-07 MED ORDER — ACETAMINOPHEN 325 MG PO TABS: 650.0000 mg | ORAL_TABLET | Freq: Four times a day (QID) | ORAL | Status: DC | PRN

## 2021-12-07 MED ORDER — ACETAMINOPHEN 650 MG RE SUPP
650.0000 mg | Freq: Four times a day (QID) | RECTAL | Status: DC | PRN
Start: 2021-12-07 — End: 2021-12-08

## 2021-12-07 MED ORDER — ONDANSETRON HCL 4 MG/2ML IJ SOLN: 4.0000 mg | Freq: Four times a day (QID) | INTRAMUSCULAR | Status: DC | PRN

## 2021-12-07 MED ORDER — SODIUM CHLORIDE 0.9% FLUSH: 3.0000 mL | Freq: Two times a day (BID) | INTRAVENOUS | Status: DC

## 2021-12-07 MED ORDER — VITAMIN D 25 MCG (1000 UNIT) PO TABS
5000.0000 [IU] | ORAL_TABLET | Freq: Every day | ORAL | Status: DC
  Administered 2021-12-08 – 2021-12-11 (×4): 5000 [IU] via ORAL
  Filled 2021-12-07 (×4): qty 5

## 2021-12-07 MED ORDER — APIXABAN 5 MG PO TABS: 5.0000 mg | ORAL_TABLET | Freq: Two times a day (BID) | ORAL | Status: DC

## 2021-12-07 NOTE — Assessment & Plan Note (Signed)
Continue Hyzaar but decrease dose to 50 mg daily

## 2021-12-07 NOTE — ED Triage Notes (Signed)
Patient referred to ED by cardiology. Patient had been wearing a portable heart monitor and was called by cardiology office after device was read and told that he was having frequent episodes of ventricular tachycardia, lasting up to ninety seconds. Patient is alert, oriented, ambulatory, and denies any complaints at this time.

## 2021-12-07 NOTE — H&P (Addendum)
Cardiology Admission History and Physical:   Patient ID: SHAHIN KNIERIM MRN: [id number]; DOB: 02/16/1951   Admission date: 12/07/2021  PCP:  [name], Pasty Spillers, MD   [facility] Providers Cardiologist:  Chilton Si, MD    Chief Complaint:  vt  Patient Profile:   John Keith is a 71 y.o. male with HTN, HLDm smoker, Gastric ulcer w/bleed and Aflutter who is being seen 12/07/2021 for the evaluation of recurrent VT.  History of Present Illness:   Mr. Wanzer was recently referred to the Afib clinic for management strategies for his AFutter, initially diagnosed 09/22/21 with symptoms of dizziness/palpitations He was started on eliquis, diltiazem and planned for DCCV once adequately anticoagulated. Also recommended sleep study with apparent witnessed apneic events but he declined DCCV successful 10/19/21  Athis f/u with the AFib clinic, discussed some degree of chronic CP for years, but newer was ongoing intermittent dizziness/lightheadedness, despite SR.  Planned for a 2 week monitor  He did have cardiology follow up 12/03/21 for recurrent dizziness and a syncopal event, a couple weeks prior and brief near syncope in the days leading up to his visit 12/03/21. His BP meds adjusted and requested results from his monitor be expedited   Monitor results became available today with recurrent sustained episodes of VT and in c/w dr. Graciela Husbands he was advised to go to the ER for admission Interestingly not patient triggered an episode lasting 7min30sec at 208bpm  LABS are pending  He tells me that thelast couple months he has had some lighter senses of feeling lightheaded or dizzy on/off though in the last 3 weeks or so stronger with moments of near syncope and one full syncopal avenet. His syncope happened when he was out of town, doing nothing in particular just woke on the floor family shaking him awake, he thinks he was out a monitor, can not be sure.  EMS was not called, but he did  seek attention (noted above) with cardiology  He has some CP, his timeline for me is new in the last 64mo, central, heavy and usually but not always triggered by walking, sometime associate with racing heart rate, not always, perhaps 2 times it has woken him. He says he relaxes, sits, and it just goes away. He has been more SOB since his COVID illness in 2021, this is not changes.  Past Medical History:  Diagnosis Date   Allergy    Arthritis    Atrial flutter (HCC)    Carpal tunnel syndrome on left    Chest pain of uncertain etiology 10/24/2019   COVID-19    07/07/19   CTS (carpal tunnel syndrome)    left   DDD (degenerative disc disease), thoracolumbar    unknown location    DDD (degenerative disc disease), thoracolumbar    multilevel   Degenerative joint disease of left shoulder    Degenerative joint disease of left shoulder    02/2011    Diverticulosis    left colon (2008)    ED (erectile dysfunction)    02/2011    ED (erectile dysfunction)    GERD (gastroesophageal reflux disease)    H. pylori infection    H. pylori infection    Hyperlipidemia    Hypertension    Insomnia    Lactose intolerance    Lactose intolerance    Low back pain    PVC (premature ventricular contraction) 10/24/2019   Shingles    07/2018   Smoking    smoking since age 1  y.o   Toe fracture, left    4th in 2014   Vitamin D deficiency    Vitamin D deficiency     Past Surgical History:  Procedure Laterality Date   APPENDECTOMY     BACK SURGERY     x 2 10991 and 1995 in [place]    CARDIAC CATHETERIZATION     1999 nl   CARDIOVERSION N/A 10/19/2021   Procedure: CARDIOVERSION;  Surgeon: Christell Constant, MD;  Location: [facility];  Service: Cardiovascular;  Laterality: N/A;   CARPAL TUNNEL RELEASE Left    COLONOSCOPY     2008 diverticulosis   ESOPHAGOGASTRODUODENOSCOPY     h/o +h. pylori   OTHER SURGICAL HISTORY     heart catherization 1999 normal    SPINE SURGERY     x 2       Medications Prior to Admission: Prior to Admission medications   Medication Sig Start Date End Date Taking? Authorizing Provider  apixaban (ELIQUIS) 5 MG TABS tablet Take 1 tablet (5 mg total) by mouth 2 (two) times daily. 09/24/21   Fenton, Clint R, PA  Cholecalciferol (VITAMIN D-3) 125 MCG (5000 UT) TABS Take 1 tablet by mouth daily.    [provider]  diltiazem (CARDIZEM CD) 120 MG 24 hr capsule Take 1 capsule (120 mg total) by mouth daily. 09/24/21   Fenton, Clint R, PA  ezetimibe (ZETIA) 10 MG tablet Take 1 tablet (10 mg total) by mouth daily. 05/15/21   [name], Pasty Spillers, MD  ferrous sulfate 325 (65 FE) MG tablet Take 325 mg by mouth daily with breakfast.    [provider]  gabapentin (NEURONTIN) 300 MG capsule Take 3 capsules (900 mg total) by mouth at bedtime. Patient taking differently: Take 300-600 mg by mouth See admin instructions. Take 300 mg in the morning and 600 mg at bedtime 02/26/21   Glori Luis, MD  losartan-hydrochlorothiazide (HYZAAR) 100-12.5 MG tablet Take 0.5 tablets by mouth daily. Take half tablet Daily 12/03/21   Alver Sorrow, NP  Multiple Vitamin (MULTIVITAMIN) tablet Take 1 tablet by mouth daily.    [provider]  pantoprazole (PROTONIX) 40 MG tablet Take 1 tablet (40 mg total) by mouth daily. 30 min before food 05/15/21   [name], Pasty Spillers, MD     Allergies:    Allergies  Allergen Reactions   Crestor [Rosuvastatin Calcium] Other (See Comments)    Elevated muscle enzymes    Lipitor [Atorvastatin Calcium] Other (See Comments)    Elevated muscle enzymes    Tomato Itching    Social History:   Social History   Socioeconomic History   Marital status: Married    Spouse name: Not on file   Number of children: Not on file   Years of education: Not on file   Highest education level: Not on file  Occupational History   Not on file  Tobacco Use   Smoking status: Former    Packs/day: 0.50    Types:  Cigarettes    Start date: 07/06/1971    Quit date: 06/26/2020    Years since quitting: 1.4   Smokeless tobacco: Never   Tobacco comments:    Former smoker 09/24/21  Substance and Sexual Activity   Alcohol use: No    Alcohol/week: 0.0 standard drinks   Drug use: No   Sexual activity: Not on file  Other Topics Concern   Not on file  Social History Narrative   Married    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  On child    1 son, 1 daughter   Machine op   Owns guns    Wears seat belt, safe in relationship    Smoker    Retired 06/27/2019   Social Determinants of Corporate investment banker Strain: Not on [employer] Insecurity: Not on file  Transportation Needs: Not on file  Physical Activity: Not on file  Stress: Not on file  Social Connections: Not on file  Intimate Partner Violence: Not on file    Family History:  The patient's family history includes Cancer in his father and maternal uncle; Diabetes in his maternal aunt; Heart attack in his maternal grandmother; Heart disease in his mother; Throat cancer (age of onset: 48) in his father.    ROS:  Please see the history of present illness.  All other ROS reviewed and negative.     Physical Exam/Data:   Vitals:   12/07/21 1523  BP: 113/87  Pulse: 71  Resp: 16  Temp: 98.4 F (36.9 C)  TempSrc: Oral  SpO2: 96%   No intake or output data in the 24 hours ending 12/07/21 1647    12/03/2021   10:01 AM 11/16/2021    9:25 AM 10/19/2021    8:46 AM  Last 3 Weights  Weight (lbs) 253 lb 12.8 oz 251 lb 253 lb  Weight (kg) 115.123 kg 113.853 kg 114.76 kg     There is no height or weight on file to calculate BMI.  General:  Well nourished, well developed, in no acute distress HEENT: normal Neck: no JVD Vascular: No carotid bruits; Distal pulses 2+ bilaterally   Cardiac:  RRR; no murmurs, gallops or rubs Lungs:  CTA b/l, no wheezing, rhonchi or rales  Abd: soft, nontender  Ext: no edema Musculoskeletal:  No deformities Skin: warm and dry   Neuro: no focal abnormalities noted Psych:  Normal affect    EKG:  The ECG that was done SR  73bpm, inf/lat T flattening/inversion, no ST changes was personally reviewed and demonstrates   Relevant CV Studies:  13 day monitor: May 2023 Predominant underlying rhythm was sinus rhythm Less than 1% atrial fibrillation burden, true burden difficult due to presence of artifact during episodes Multiple episodes of wide-complex tachycardia, appears due to ventricular tachycardia Longest episode of ventricular tachycardia 1 minute 30 seconds with a heart rate of 208 bpm    06/11/2020: Coronary CT IMPRESSION: 1. Coronary calcium score of 23.1. This was 48th percentile for age and sex matched control. 2. Normal coronary origin with right dominance. 3. Mild calcifications in the proximal RCA, proximal and mid LAD causing minimal stenosis. 4. CAD-RADS 1. Minimal non-obstructive CAD (0-24%). Consider non-atherosclerotic causes of chest pain. Consider preventive therapy and risk factor modification.  Laboratory Data:  High Sensitivity Troponin:  No results for input(s): TROPONINIHS in the last 720 hours.    Chemistry Recent Labs  Lab 12/03/21 1106  NA 137  K 4.1  CL 99  CO2 22  GLUCOSE 87  BUN 9  CREATININE 0.91  CALCIUM 9.4    No results for input(s): PROT, ALBUMIN, AST, ALT, ALKPHOS, BILITOT in the last 168 hours. Lipids No results for input(s): CHOL, TRIG, HDL, LABVLDL, LDLCALC, CHOLHDL in the last 168 hours. Hematology Recent Labs  Lab 12/03/21 1106  WBC 5.8  RBC 4.29  HGB 13.6  HCT 38.7  MCV 90  MCH 31.7  MCHC 35.1  RDW 11.6  PLT 314   Thyroid  Recent Labs  Lab 12/03/21  1106  TSH 1.160   BNPNo results for input(s): BNP, PROBNP in the last 168 hours.  DDimer No results for input(s): DDIMER in the last 168 hours.   Radiology/Studies:  LONG TERM MONITOR (3-14 DAYS)  Result Date: 12/07/2021 Patch Wear Time:  13 days and 21 hours Predominant underlying rhythm  was sinus rhythm Less than 1% atrial fibrillation burden, true burden difficult due to presence of artifact during episodes Multiple episodes of wide-complex tachycardia, appears due to ventricular tachycardia Longest episode of ventricular tachycardia 1 minute 30 seconds with a heart rate of 208 bpm Will Camnitz, MD    Assessment and Plan:   MMVT Syncope, recurrent near syncope ? New EKG changes from April, though has had similar on older EKGs No current data on LVEF   He fainted on Friday May 19th, at about 2 in the afternoon Has had several near syncopal events, those dates are unclear This does not line up with the VT's on his monitor, or any particular findings on his monitor for that day are logged.  Labs are pending D/w dr. Graciela Husbands, he will see the patient, review the labs, plan for admission and further evaluation and workup and meds.       Risk Assessment/Risk Scores:    For questions or updates, please contact [facility] Please consult [email or web address] for contact info under     Signed, Sheilah Pigeon, PA-C  12/07/2021 4:47 PM  Syncope  Ventricular tachycardia  Atrial fibrillation   Hypertension  Left ventricular hypertrophy  Sinus bradycardia  PVCs right bundle inferior axis with a delayed intrinsicoid deflection    The patient is having recurrent episodes of tachypalpitations associate with presyncope and dyspnea.  His episodes of syncope have not been associated with a tachypalpitations but the recommendation is similar and I suspect it is secondary.  He had repeat pattern of episodes of ventricular tachycardia and will certainly need antiarrhythmic suppression.  I will hold off however as we await evaluation of his cardiac function, we will need an echocardiogram and catheterization and then we will need to determine optimal drug, I suspect will be amiodarone and with his bradycardia he may need backup bradycardia pacing and would probably lean towards  high-voltage therapy at that juncture.  I would also recommend cMRI, he has left ventricular hypertrophy and while this may be in the context of longstanding hypertension, his ventricular tachycardia is concerning for other potential structural issues.  Low voltage relatively in the limb leads, we will want to exclude amyloid also

## 2021-12-07 NOTE — Telephone Encounter (Signed)
Patient called with results of Zio monitor showing frequent VT, longest episode 1.5 minutes. D/w Dr Graciela Husbands. Given symptomatic VT, patient instructed to go to closest ED for evaluation. Patient voiced understanding and is in agreement with plan.

## 2021-12-07 NOTE — ED Notes (Signed)
Called lab to verify they received bloodwork, they stated they have it and are processing it now

## 2021-12-07 NOTE — Assessment & Plan Note (Signed)
Treatment as outlined in 1 

## 2021-12-07 NOTE — Assessment & Plan Note (Signed)
Patient presents to the ER for evaluation of multiple syncopal episodes over the last 3 weeks. Unclear etiology and could be secondary to hypotension, symptomatic ventricular tachycardia versus postconversion pause. Patient had a ZIO monitor placed which showed Predominant underlying rhythm was sinus rhythm. Less than 1% atrial fibrillation burden, true burden difficult due to presence of artifact during episodes. Multiple episodes of wide-complex tachycardia, appears due to ventricular tachycardia Longest episode of ventricular tachycardia 1 minute 30 seconds with a heart rate of 208 bpm Cardiology consult Check and supplement electrolytes especially magnesium and potassium

## 2021-12-07 NOTE — ED Notes (Signed)
MD at bedside. 

## 2021-12-07 NOTE — ED Notes (Signed)
X-ray at bedside

## 2021-12-07 NOTE — H&P (Signed)
History and Physical    Patient: John Keith DOB: Apr 11, 1951 DOA: 12/07/2021 DOS: the patient was seen and examined on 12/07/2021 PCP: [name], [name], MD  Patient coming from: Home  Chief Complaint:  Chief Complaint  Patient presents with   Palpitations   HPI: John Keith is a 71 y.o. male with medical history significant for hypertension, dyslipidemia, nicotine dependence, history of gastric ulcer, aortic atherosclerosis, atrial flutter initially diagnosed  09/22/21 and started on diltiazem and Eliquis, status post cardioversion 10/19/21 who presents to the emergency room for evaluation of multiple and frequent syncopal episodes which patient states has increased since after his cardioversion. Patient states that he has had intermittent lightheadedness since his cardioversion and had a 14-day Zio patch placed.  Per patient he has had multiple syncopal episodes over the last 2 weeks.  He was out of town and states that he leaned over to tie his shoes and he blacked out.  He woke up to see his family members leaning over him.  Since then he has had 2 more episodes witnessed by his family members.  According to his daughter who witnessed one of the episodes, she describes a "look" and then the patient starts complaining of feeling lightheaded like he is about to pass out.  Both episodes have happened once while sitting on the other while standing.  Patient denies any injury with any of the episodes, no vision change, no incontinence. He complains of intermittent central chest pain mostly with exertion and associated with palpitations.  Pain is nonradiating.  He does have shortness of breath which she said is not new and he has had it since he had COVID in 2021 He denies having any nausea, no vomiting, no diarrhea, no fever, no cough, no chills, no headache, no urinary symptoms, no changes in his bowel habits, no hematuria, no melena stools, no hematochezia or hematemesis,  no leg swelling, no focal deficit or blurred vision. He was referred to the ER by his cardiologist because his ZIO patch showed frequent VT with the longest episode 1.5 minutes for evaluation of symptomatic VT.   Review of Systems: As mentioned in the history of present illness. All other systems reviewed and are negative. Past Medical History:  Diagnosis Date   Allergy    Arthritis    Atrial flutter ([place])    Carpal tunnel syndrome on left    Chest pain of uncertain etiology [id number]   COVID-19    07/07/19   CTS (carpal tunnel syndrome)    left   DDD (degenerative disc disease), thoracolumbar    unknown location    DDD (degenerative disc disease), thoracolumbar    multilevel   Degenerative joint disease of left shoulder    Degenerative joint disease of left shoulder    02/2011    Diverticulosis    left colon (2008)    ED (erectile dysfunction)    02/2011    ED (erectile dysfunction)    GERD (gastroesophageal reflux disease)    H. pylori infection    H. pylori infection    Hyperlipidemia    Hypertension    Insomnia    Lactose intolerance    Lactose intolerance    Low back pain    PVC (premature ventricular contraction) 10/24/2019   Shingles    07/2018   Smoking    smoking since age 70 y.o   Toe fracture, left    4th in 2014   Vitamin D deficiency    Vitamin  D deficiency    Past Surgical History:  Procedure Laterality Date   APPENDECTOMY     BACK SURGERY     x 2 [id number] and 1995 in [place] nl   CARDIOVERSION N/A 10/19/2021   Procedure: CARDIOVERSION;  Surgeon: Werner Lean, MD;  Location: [facility];  Service: Cardiovascular;  Laterality: N/A;   CARPAL TUNNEL RELEASE Left    COLONOSCOPY     2008 diverticulosis   ESOPHAGOGASTRODUODENOSCOPY     h/o +h. pylori   OTHER SURGICAL HISTORY     heart catherization 1999 normal    SPINE SURGERY     x 2    Social History:  reports that he quit smoking about 17 months ago. His  smoking use included cigarettes. He started smoking about 50 years ago. He smoked an average of .5 packs per day. He has never used smokeless tobacco. He reports that he does not drink alcohol and does not use drugs.  Allergies  Allergen Reactions   Crestor [Rosuvastatin Calcium] Other (See Comments)    Elevated muscle enzymes    Lipitor [Atorvastatin Calcium] Other (See Comments)    Elevated muscle enzymes    Tomato Itching    Family History  Problem Relation Age of Onset   Heart disease Mother        age 15    Throat cancer Father 47   Cancer Father        throat    Cancer Maternal Uncle        uncle ?maternal or paternal died colon cancer in his 78s    Diabetes Maternal Aunt    Heart attack Maternal Grandmother     Prior to Admission medications   Medication Sig Start Date End Date Taking? Authorizing Provider  apixaban (ELIQUIS) 5 MG TABS tablet Take 1 tablet (5 mg total) by mouth 2 (two) times daily. 09/24/21   Fenton, Clint R, PA  Cholecalciferol (VITAMIN D-3) 125 MCG (5000 UT) TABS Take 1 tablet by mouth daily.    [provider]  diltiazem (CARDIZEM CD) 120 MG 24 hr capsule Take 1 capsule (120 mg total) by mouth daily. 09/24/21   Fenton, Clint R, PA  ezetimibe (ZETIA) 10 MG tablet Take 1 tablet (10 mg total) by mouth daily. 05/15/21   [name], [name], MD  ferrous sulfate 325 (65 FE) MG tablet Take 325 mg by mouth daily with breakfast.    [provider]  gabapentin (NEURONTIN) 300 MG capsule Take 3 capsules (900 mg total) by mouth at bedtime. Patient taking differently: Take 300-600 mg by mouth See admin instructions. Take 300 mg in the morning and 600 mg at bedtime 02/26/21   Leone Haven, MD  losartan-hydrochlorothiazide (HYZAAR) 100-12.5 MG tablet Take 0.5 tablets by mouth daily. Take half tablet Daily 12/03/21   Loel Dubonnet, NP  Multiple Vitamin (MULTIVITAMIN) tablet Take 1 tablet by mouth daily.    [provider]   pantoprazole (PROTONIX) 40 MG tablet Take 1 tablet (40 mg total) by mouth daily. 30 min before food 05/15/21   [name], [name], MD    Physical Exam: Vitals:   12/07/21 1815 12/07/21 1845 12/07/21 2000 12/07/21 2019  BP: 133/86 (!) 158/92 (!) 152/94 (!) 160/83  Pulse: 68 61 61   Resp: 12 13 12    Temp:      TempSrc:      SpO2:  99% 96%    Physical Exam Vitals  and nursing note reviewed.  Constitutional:      Appearance: Normal appearance.  HENT:     Head: Normocephalic and atraumatic.     Nose: Nose normal.     Mouth/Throat:     Mouth: Mucous membranes are moist.  Eyes:     Pupils: Pupils are equal, round, and reactive to light.  Cardiovascular:     Rate and Rhythm: Normal rate and regular rhythm.  Abdominal:     General: Abdomen is flat.     Palpations: Abdomen is soft.  Musculoskeletal:        General: Normal range of motion.     Cervical back: Normal range of motion and neck supple.  Skin:    General: Skin is warm and dry.  Neurological:     General: No focal deficit present.     Mental Status: He is alert and oriented to person, place, and time.  Psychiatric:        Mood and Affect: Mood normal.        Behavior: Behavior normal.    Data Reviewed: Relevant notes from primary care and specialist visits, past discharge summaries as available in EHR, including Care Everywhere. Prior diagnostic testing as pertinent to current admission diagnoses Updated medications and problem lists for reconciliation ED course, including vitals, labs, imaging, treatment and response to treatment Triage notes, nursing and pharmacy notes and ED provider's notes Notable results as noted in HPI Labs reviewed.  Troponin 41 >> 39, sodium 141, potassium 4.0, chloride 107, bicarb 26, glucose 89, BUN 17, creatinine 1.18, calcium 9.5, magnesium 2.4, white count 7.8, hemoglobin 13.1, hematocrit 42.1, platelet count 271 Twelve-lead EKG reviewed by me shows sinus rhythm with PVCs.  T wave  abnormality in the lateral leads. Chest x-ray shows no evidence of cardiopulmonary disease Zio patch results Predominant underlying rhythm was sinus rhythm. Less than 1% atrial fibrillation burden, true burden difficult due to presence of artifact during episodes. Multiple episodes of wide-complex tachycardia, appears due to ventricular tachycardia. Longest episode of ventricular tachycardia 1 minute 30 seconds with a heart rate of 208 bpm There are no new results to review at this time.  Assessment and Plan: * Syncope Patient presents to the ER for evaluation of multiple syncopal episodes over the last 3 weeks. Unclear etiology and could be secondary to hypotension, symptomatic ventricular tachycardia versus postconversion pause. Patient had a ZIO monitor placed which showed Predominant underlying rhythm was sinus rhythm. Less than 1% atrial fibrillation burden, true burden difficult due to presence of artifact during episodes. Multiple episodes of wide-complex tachycardia, appears due to ventricular tachycardia Longest episode of ventricular tachycardia 1 minute 30 seconds with a heart rate of 208 bpm Cardiology consult Check and supplement electrolytes especially magnesium and potassium   Ventricular tachycardia (HCC) Treatment as outlined in 1  Typical atrial flutter [facility]) Patient with a history of atrial flutter status post DCCV on 10/15/21. Continue Eliquis as primary prophylaxis for an acute stroke.  Patient noted to have a CHADS2VASC  score of 3 Continue diltiazem for rate control  Essential hypertension Continue Hyzaar but decrease dose to 50 mg daily      Advance Care Planning:   Code Status: Full Code   Consults: Cardiology  Family Communication: Greater than 50% of time was spent discussing patient's condition and plan of care with him and his daughter at the bedside.  All questions and concerns have been addressed.  He verbalizes understanding and agrees with the  plan.  Severity of  Illness: The appropriate patient status for this patient is INPATIENT. Inpatient status is judged to be reasonable and necessary in order to provide the required intensity of service to ensure the patient's safety. The patient's presenting symptoms, physical exam findings, and initial radiographic and laboratory data in the context of their chronic comorbidities is felt to place them at high risk for further clinical deterioration. Furthermore, it is not anticipated that the patient will be medically stable for discharge from the hospital within 2 midnights of admission.   * I certify that at the point of admission it is my clinical judgment that the patient will require inpatient hospital care spanning beyond 2 midnights from the point of admission due to high intensity of service, high risk for further deterioration and high frequency of surveillance required.*  Author: Collier Bullock, MD 12/07/2021 8:22 PM  For on call review [email or web address].

## 2021-12-07 NOTE — ED Provider Triage Note (Signed)
Emergency Medicine Provider Triage Evaluation Note  John Keith , a 71 y.o. male  was evaluated in triage.  Pt complains of syncopal episodes.  Pt advised to come in for v tach episodes per cardiology   Review of Systems  Positive:  Negative: No chest pain   Physical Exam  BP 113/87 (BP Location: Right Arm)   Pulse 71   Temp 98.4 F (36.9 C) (Oral)   Resp 16   SpO2 96%  Gen:   Awake, no distress   Resp:  Normal effort  MSK:   Moves extremities without difficulty  Other:    Medical Decision Making  Medically screening exam initiated at 4:02 PM.  Appropriate orders placed.  John Keith was informed that the remainder of the evaluation will be completed by another provider, this initial triage assessment does not replace that evaluation, and the importance of remaining in the ED until their evaluation is complete.     John Keith, [place] 12/07/21 1603

## 2021-12-07 NOTE — ED Provider Notes (Signed)
[facility] EMERGENCY DEPARTMENT Provider Note   CSN: [id number] Arrival date & time: 12/07/21  1432     History  Chief Complaint  Patient presents with   Palpitations    John Keith is a 71 y.o. male.  HPI Patient is a 71 year old male with a history of hypertension, GERD, atrial flutter, who presents to the emergency department at the request of his cardiologist due to palpitations.  Patient currently wearing a ZIO monitor.  Per records, his ZIO monitor showed frequent VT with the longest episode lasting 1.5 minutes.  Patient states that starting about 3 weeks ago he began experiencing intermittent episodes of palpitations as well as lightheadedness.  These occur on nearly a daily basis.  He has had 2-3 brief syncopal episodes when these occur.  States that at times he will also get chest pain when they occur.  States the last episode of lightheadedness was this morning before coming to the emergency department.  His last episode of near syncope was 5 days ago.  Currently denies any somatic complaints.    Home Medications Prior to Admission medications   Medication Sig Start Date End Date Taking? Authorizing Provider  apixaban (ELIQUIS) 5 MG TABS tablet Take 1 tablet (5 mg total) by mouth 2 (two) times daily. 09/24/21   Fenton, Clint R, PA  Cholecalciferol (VITAMIN D-3) 125 MCG (5000 UT) TABS Take 1 tablet by mouth daily.    [provider]  diltiazem (CARDIZEM CD) 120 MG 24 hr capsule Take 1 capsule (120 mg total) by mouth daily. 09/24/21   Fenton, Clint R, PA  ezetimibe (ZETIA) 10 MG tablet Take 1 tablet (10 mg total) by mouth daily. 05/15/21   [name], Pasty Spillers, MD  ferrous sulfate 325 (65 FE) MG tablet Take 325 mg by mouth daily with breakfast.    [provider]  gabapentin (NEURONTIN) 300 MG capsule Take 3 capsules (900 mg total) by mouth at bedtime. Patient taking differently: Take 300-600 mg by mouth See admin instructions. Take  300 mg in the morning and 600 mg at bedtime 02/26/21   Glori Luis, MD  losartan-hydrochlorothiazide (HYZAAR) 100-12.5 MG tablet Take 0.5 tablets by mouth daily. Take half tablet Daily 12/03/21   Alver Sorrow, NP  Multiple Vitamin (MULTIVITAMIN) tablet Take 1 tablet by mouth daily.    [provider]  pantoprazole (PROTONIX) 40 MG tablet Take 1 tablet (40 mg total) by mouth daily. 30 min before food 05/15/21   [name], Pasty Spillers, MD      Allergies    Crestor [rosuvastatin calcium], Lipitor [atorvastatin calcium], and Tomato    Review of Systems   Review of Systems  All other systems reviewed and are negative. Ten systems reviewed and are negative for acute change, except as noted in the HPI.   Physical Exam Updated Vital Signs BP 133/86   Pulse 68   Temp 98.4 F (36.9 C) (Oral)   Resp 12   SpO2 97%  Physical Exam Vitals and nursing note reviewed.  Constitutional:      General: He is not in acute distress.    Appearance: Normal appearance. He is not ill-appearing, toxic-appearing or diaphoretic.  HENT:     Head: Normocephalic and atraumatic.     Right Ear: External ear normal.     Left Ear: External ear normal.     Nose: Nose normal.     Mouth/Throat:     Mouth: Mucous membranes are moist.  Pharynx: Oropharynx is clear. No oropharyngeal exudate or posterior oropharyngeal erythema.  Eyes:     Extraocular Movements: Extraocular movements intact.  Cardiovascular:     Rate and Rhythm: Normal rate and regular rhythm.     Pulses: Normal pulses.     Heart sounds: Normal heart sounds. No murmur heard.   No friction rub. No gallop.  Pulmonary:     Effort: Pulmonary effort is normal. No respiratory distress.     Breath sounds: Normal breath sounds. No stridor. No wheezing, rhonchi or rales.  Abdominal:     General: Abdomen is flat.     Tenderness: There is no abdominal tenderness.  Musculoskeletal:        General: Normal range of motion.     Cervical  back: Normal range of motion and neck supple. No tenderness.  Skin:    General: Skin is warm and dry.  Neurological:     General: No focal deficit present.     Mental Status: He is alert and oriented to person, place, and time.  Psychiatric:        Mood and Affect: Mood normal.        Behavior: Behavior normal.   ED Results / Procedures / Treatments   Labs (all labs ordered are listed, but only abnormal results are displayed) Labs Reviewed  CBC - Abnormal; Notable for the following components:      Result Value   RBC 4.20 (*)    MCV 100.2 (*)    All other components within normal limits  TROPONIN I (HIGH SENSITIVITY) - Abnormal; Notable for the following components:   Troponin I (High Sensitivity) 41 (*)    All other components within normal limits  TROPONIN I (HIGH SENSITIVITY) - Abnormal; Notable for the following components:   Troponin I (High Sensitivity) 39 (*)    All other components within normal limits  BASIC METABOLIC PANEL  HEPATIC FUNCTION PANEL  MAGNESIUM   EKG None  Radiology DG Chest Portable 1 View  Result Date: 12/07/2021 CLINICAL DATA:  Chest pain. Syncope. Frequent runs of ventricular tachycardia on heart monitor. EXAM: PORTABLE CHEST 1 VIEW COMPARISON:  Multiple exams, including 05/16/2021 FINDINGS: The heart size and mediastinal contours are within normal limits. Both lungs are clear. The visualized skeletal structures are unremarkable. IMPRESSION: No active disease. Electronically Signed   By: Gaylyn Rong M.D.   On: 12/07/2021 17:07   LONG TERM MONITOR (3-14 DAYS)  Result Date: 12/07/2021 Patch Wear Time:  13 days and 21 hours Predominant underlying rhythm was sinus rhythm Less than 1% atrial fibrillation burden, true burden difficult due to presence of artifact during episodes Multiple episodes of wide-complex tachycardia, appears due to ventricular tachycardia Longest episode of ventricular tachycardia 1 minute 30 seconds with a heart rate of 208 bpm  Will Camnitz, MD   Procedures Procedures   Medications Ordered in ED Medications - No data to display  ED Course/ Medical Decision Making/ A&P                           Medical Decision Making Amount and/or Complexity of Data Reviewed Labs: ordered. Radiology: ordered.  Risk Decision regarding hospitalization.   Pt is a 71 y.o. male who presents to the emergency department at the request of his cardiologist due to what appears to be ventricular tachycardia.  Please see HPI above for additional information.  Labs: CBC with RBCs of 4.2 and an MCV of 100.2. BMP without  abnormalities. Magnesium within normal limits at 2.4. Troponin of 39 with a repeat of 41. Hepatic function panel within normal limits.  Imaging: Chest x-ray shows no active disease.  I, Placido Sou, PA-C, personally reviewed and evaluated these images and lab results as part of my medical decision-making.  On my exam heart is regular rate and rhythm without murmurs, rubs, or gallops.  Lungs are clear to auscultation bilaterally.  Abdomen is soft and nontender.  No pedal edema.  Patient currently denying any somatic complaints.  Last notes an episode of lightheadedness and palpitations earlier today.  Patient evaluated by cardiology.  Please see their note for additional recommendations.  Recommend medicine admission for further evaluation.  This was discussed with the patient and he is agreeable.  We will discuss with the medicine team at this time.  Note: Portions of this report may have been transcribed using voice recognition software. Every effort was made to ensure accuracy; however, inadvertent computerized transcription errors may be present.   Final Clinical Impression(s) / ED Diagnoses Final diagnoses:  Ventricular tachycardia (HCC)  Syncope, unspecified syncope type   Rx / DC Orders ED Discharge Orders     None         Placido Sou, PA-C 12/07/21 1838    Franne Forts,  DO 12/08/21 1307

## 2021-12-07 NOTE — Assessment & Plan Note (Signed)
Patient with a history of atrial flutter status post DCCV on 10/15/21. Continue Eliquis as primary prophylaxis for an acute stroke.  Patient noted to have a CHADS2VASC  score of 3 Continue diltiazem for rate control

## 2021-12-08 ENCOUNTER — Encounter (HOSPITAL_COMMUNITY)
Admission: EM | Disposition: A | Discharge status: Home / Self Care | Payer: Self-pay | Source: Home / Self Care | Attending: Internal Medicine

## 2021-12-08 ENCOUNTER — Inpatient Hospital Stay (HOSPITAL_COMMUNITY): Payer: PPO

## 2021-12-08 ENCOUNTER — Encounter (HOSPITAL_COMMUNITY): Payer: Self-pay | Admitting: Internal Medicine

## 2021-12-08 ENCOUNTER — Other Ambulatory Visit: Payer: Self-pay

## 2021-12-08 DIAGNOSIS — R55 Syncope and collapse: Secondary | ICD-10-CM | POA: Diagnosis not present

## 2021-12-08 DIAGNOSIS — I2510 Atherosclerotic heart disease of native coronary artery without angina pectoris: Secondary | ICD-10-CM

## 2021-12-08 DIAGNOSIS — I483 Typical atrial flutter: Secondary | ICD-10-CM | POA: Diagnosis not present

## 2021-12-08 DIAGNOSIS — I10 Essential (primary) hypertension: Secondary | ICD-10-CM | POA: Diagnosis not present

## 2021-12-08 DIAGNOSIS — I472 Ventricular tachycardia, unspecified: Secondary | ICD-10-CM | POA: Diagnosis not present

## 2021-12-08 HISTORY — PX: LEFT HEART CATH AND CORONARY ANGIOGRAPHY: CATH118249

## 2021-12-08 LAB — BASIC METABOLIC PANEL
Anion gap: 4 — ABNORMAL LOW (ref 5–15)
BUN: 15 mg/dL (ref 8–23)
CO2: 25 mmol/L (ref 22–32)
Calcium: 8.7 mg/dL — ABNORMAL LOW (ref 8.9–10.3)
Chloride: 110 mmol/L (ref 98–111)
Creatinine, Ser: 1.06 mg/dL (ref 0.61–1.24)
GFR, Estimated: >60 mL/min (ref >60)
Glucose, Bld: 102 mg/dL — ABNORMAL HIGH (ref 70–99)
Potassium: 3.6 mmol/L (ref 3.5–5.1)
Sodium: 139 mmol/L (ref 135–145)

## 2021-12-08 LAB — CBC
HCT: 36.8 % — ABNORMAL LOW (ref 39.0–52.0)
HCT: 39 % (ref 39.0–52.0)
Hemoglobin: 11.4 g/dL — ABNORMAL LOW (ref 13.0–17.0)
Hemoglobin: 12 g/dL — ABNORMAL LOW (ref 13.0–17.0)
MCH: 30.2 pg (ref 26.0–34.0)
MCH: 30.6 pg (ref 26.0–34.0)
MCHC: 31 g/dL (ref 30.0–36.0)
MCHC: 30.8 g/dL (ref 30.0–36.0)
MCV: 97.6 fL (ref 80.0–100.0)
MCV: 99.5 fL (ref 80.0–100.0)
Platelets: 267 10*3/uL (ref 150–400)
Platelets: 270 10*3/uL (ref 150–400)
RBC: 3.77 MIL/uL — ABNORMAL LOW (ref 4.22–5.81)
RBC: 3.92 MIL/uL — ABNORMAL LOW (ref 4.22–5.81)
RDW: 12.7 % (ref 11.5–15.5)
RDW: 12.7 % (ref 11.5–15.5)
WBC: 5.4 10*3/uL (ref 4.0–10.5)
WBC: 4.7 10*3/uL (ref 4.0–10.5)
nRBC: 0 % (ref 0.0–0.2)
nRBC: 0 % (ref 0.0–0.2)

## 2021-12-08 LAB — CREATININE, SERUM
Creatinine, Ser: 1.05 mg/dL (ref 0.61–1.24)
GFR, Estimated: >60 mL/min (ref >60)

## 2021-12-08 LAB — ECHOCARDIOGRAM COMPLETE
Area-P 1/2: 4.1 cm2
Calc EF: 43.1 %
S' Lateral: 4.3 cm
Single Plane A2C EF: 46.6 %
Single Plane A4C EF: 40.4 %

## 2021-12-08 LAB — CBG MONITORING, ED: Glucose-Capillary: 103 mg/dL — ABNORMAL HIGH (ref 70–99)

## 2021-12-08 SURGERY — LEFT HEART CATH AND CORONARY ANGIOGRAPHY
Anesthesia: LOCAL

## 2021-12-08 MED ORDER — SODIUM CHLORIDE 0.9 % IV SOLN: 250.0000 mL | INTRAVENOUS | Status: DC | PRN

## 2021-12-08 MED ORDER — HEPARIN SODIUM (PORCINE) 5000 UNIT/ML IJ SOLN
5000.0000 [IU] | Freq: Three times a day (TID) | INTRAMUSCULAR | Status: DC
  Administered 2021-12-08 – 2021-12-09 (×2): 5000 [IU] via SUBCUTANEOUS
  Filled 2021-12-08 (×2): qty 1

## 2021-12-08 MED ORDER — IOHEXOL 350 MG/ML SOLN
INTRAVENOUS | Status: DC | PRN
  Administered 2021-12-08: 45 mL

## 2021-12-08 MED ORDER — HEPARIN SODIUM (PORCINE) 1000 UNIT/ML IJ SOLN
INTRAMUSCULAR | Status: AC
  Filled 2021-12-08: qty 10

## 2021-12-08 MED ORDER — SODIUM CHLORIDE 0.9 % IV SOLN: INTRAVENOUS | Status: AC

## 2021-12-08 MED ORDER — ACETAMINOPHEN 325 MG PO TABS: 650.0000 mg | ORAL_TABLET | ORAL | Status: DC | PRN

## 2021-12-08 MED ORDER — LIDOCAINE HCL (PF) 1 % IJ SOLN
INTRAMUSCULAR | Status: AC
  Filled 2021-12-08: qty 30

## 2021-12-08 MED ORDER — ASPIRIN 81 MG PO CHEW: 81.0000 mg | CHEWABLE_TABLET | ORAL | Status: DC

## 2021-12-08 MED ORDER — SODIUM CHLORIDE 0.9 % IV SOLN
INTRAVENOUS | Status: AC | PRN
  Administered 2021-12-08: 250 mL/h via INTRAVENOUS

## 2021-12-08 MED ORDER — SODIUM CHLORIDE 0.9% FLUSH: 3.0000 mL | INTRAVENOUS | Status: DC | PRN

## 2021-12-08 MED ORDER — FENTANYL CITRATE (PF) 100 MCG/2ML IJ SOLN
INTRAMUSCULAR | Status: DC | PRN
Start: 2021-12-08 — End: 2021-12-08
  Administered 2021-12-08: 25 ug via INTRAVENOUS

## 2021-12-08 MED ORDER — HEPARIN (PORCINE) IN NACL 1000-0.9 UT/500ML-% IV SOLN
INTRAVENOUS | Status: AC
  Filled 2021-12-08: qty 1000

## 2021-12-08 MED ORDER — FENTANYL CITRATE (PF) 100 MCG/2ML IJ SOLN
INTRAMUSCULAR | Status: AC
  Filled 2021-12-08: qty 2

## 2021-12-08 MED ORDER — SODIUM CHLORIDE 0.9 % WEIGHT BASED INFUSION: 1.0000 mL/kg/h | INTRAVENOUS | Status: DC

## 2021-12-08 MED ORDER — POTASSIUM CHLORIDE CRYS ER 20 MEQ PO TBCR
40.0000 meq | EXTENDED_RELEASE_TABLET | Freq: Once | ORAL | Status: AC
Start: 2021-12-08 — End: 2021-12-08
  Administered 2021-12-08: 40 meq via ORAL
  Filled 2021-12-08: qty 2

## 2021-12-08 MED ORDER — VERAPAMIL HCL 2.5 MG/ML IV SOLN
INTRAVENOUS | Status: AC
  Filled 2021-12-08: qty 2

## 2021-12-08 MED ORDER — SODIUM CHLORIDE 0.9 % WEIGHT BASED INFUSION
1.0000 mL/kg/h | INTRAVENOUS | Status: DC
  Administered 2021-12-08: 1 mL/kg/h via INTRAVENOUS

## 2021-12-08 MED ORDER — MIDAZOLAM HCL 2 MG/2ML IJ SOLN
INTRAMUSCULAR | Status: DC | PRN
  Administered 2021-12-08: 2 mg via INTRAVENOUS

## 2021-12-08 MED ORDER — HEPARIN SODIUM (PORCINE) 1000 UNIT/ML IJ SOLN
INTRAMUSCULAR | Status: DC | PRN
  Administered 2021-12-08: 5000 [IU] via INTRAVENOUS

## 2021-12-08 MED ORDER — SODIUM CHLORIDE 0.9 % WEIGHT BASED INFUSION: 3.0000 mL/kg/h | INTRAVENOUS | Status: DC

## 2021-12-08 MED ORDER — ASPIRIN 81 MG PO CHEW
81.0000 mg | CHEWABLE_TABLET | ORAL | Status: AC
  Administered 2021-12-08: 81 mg via ORAL
  Filled 2021-12-08: qty 1

## 2021-12-08 MED ORDER — LABETALOL HCL 5 MG/ML IV SOLN: 10.0000 mg | INTRAVENOUS | Status: AC | PRN

## 2021-12-08 MED ORDER — SODIUM CHLORIDE 0.9 % WEIGHT BASED INFUSION
3.0000 mL/kg/h | INTRAVENOUS | Status: DC
  Administered 2021-12-08: 3 mL/kg/h via INTRAVENOUS

## 2021-12-08 MED ORDER — ONDANSETRON HCL 4 MG/2ML IJ SOLN: 4.0000 mg | Freq: Four times a day (QID) | INTRAMUSCULAR | Status: DC | PRN

## 2021-12-08 MED ORDER — HEPARIN (PORCINE) IN NACL 1000-0.9 UT/500ML-% IV SOLN
INTRAVENOUS | Status: DC | PRN
  Administered 2021-12-08 (×2): 500 mL

## 2021-12-08 MED ORDER — SODIUM CHLORIDE 0.9% FLUSH
3.0000 mL | Freq: Two times a day (BID) | INTRAVENOUS | Status: DC
  Administered 2021-12-09 – 2021-12-10 (×4): 3 mL via INTRAVENOUS

## 2021-12-08 MED ORDER — HYDRALAZINE HCL 20 MG/ML IJ SOLN: 10.0000 mg | INTRAMUSCULAR | Status: AC | PRN

## 2021-12-08 MED ORDER — GADOBUTROL 1 MMOL/ML IV SOLN
15.0000 mL | Freq: Once | INTRAVENOUS | Status: AC | PRN
Start: 2021-12-08 — End: 2021-12-08
  Administered 2021-12-08: 15 mL via INTRAVENOUS

## 2021-12-08 MED ORDER — MIDAZOLAM HCL 2 MG/2ML IJ SOLN
INTRAMUSCULAR | Status: AC
  Filled 2021-12-08: qty 2

## 2021-12-08 MED ORDER — LIDOCAINE HCL (PF) 1 % IJ SOLN
INTRAMUSCULAR | Status: DC | PRN
  Administered 2021-12-08: 2 mL via INTRADERMAL

## 2021-12-08 MED ORDER — VERAPAMIL HCL 2.5 MG/ML IV SOLN
INTRAVENOUS | Status: DC | PRN
  Administered 2021-12-08: 10 mL via INTRA_ARTERIAL

## 2021-12-08 SURGICAL SUPPLY — 12 items
BAND CMPR LRG ZPHR (HEMOSTASIS) ×1
BAND ZEPHYR COMPRESS 30 LONG (HEMOSTASIS) ×1 IMPLANT
CATH 5FR JL3.5 JR4 ANG PIG MP (CATHETERS) ×1 IMPLANT
GLIDESHEATH SLEND SS 6F .021 (SHEATH) ×1 IMPLANT
GUIDEWIRE INQWIRE 1.5J.035X260 (WIRE) IMPLANT
INQWIRE 1.5J .035X260CM (WIRE) ×2
KIT HEART LEFT (KITS) ×2 IMPLANT
PACK CARDIAC CATHETERIZATION (CUSTOM PROCEDURE TRAY) ×2 IMPLANT
SHEATH PROBE COVER 6X72 (BAG) ×1 IMPLANT
SYR MEDRAD MARK 7 150ML (SYRINGE) ×2 IMPLANT
TRANSDUCER W/STOPCOCK (MISCELLANEOUS) ×2 IMPLANT
TUBING CIL FLEX 10 FLL-RA (TUBING) ×2 IMPLANT

## 2021-12-08 NOTE — Progress Notes (Addendum)
PROGRESS NOTE    John Keith  CVE:[id number] DOB: 1950-08-24 DOA: 12/07/2021 PCP: [name], [name], MD   Chief Complaint  Patient presents with   Palpitations    Brief Narrative:    John Keith is a 71 y.o. male with medical history significant for hypertension, dyslipidemia, nicotine dependence, history of gastric ulcer, aortic atherosclerosis, atrial flutter initially diagnosed  09/22/21 and started on diltiazem and Eliquis, status post cardioversion 10/19/21 who presents to the emergency room for evaluation of multiple and frequent syncopal episodes which patient states has increased since after his cardioversion. Patient states that he has had intermittent lightheadedness since his cardioversion and had a 14-day Zio patch placed.  Per patient he has had multiple syncopal episodes over the last 2 weeks.  Results came available via Zio patch for recurrent sustained episodes of V. tach, so he was referred to ED for admission,  Assessment & Plan:   Principal Problem:   Syncope Active Problems:   Ventricular tachycardia (HCC)   Typical atrial flutter (HCC)   Essential hypertension   Syncope -Patient with multiple syncopal events, Zio patch significant for multiple episodes of sustained V. tach, this is most likely causing his symptoms. -Continue to monitor on telemetry. -Continue with IV fluids. -2D echo with a EF 35 to 40%.  Ventricular tachycardia (HCC) -Work-up per EP cardiology, work-up includes cardiac cath to evaluate for ischemic disease plan for cardiac cath today, Eliquis on hold. -Likely will need cardiac MRI, possibly tomorrow with some suspicion of amyloid. - multiple myeloma panel    A flutter -Eliquis on hold due to plan for cardiac cath today, to be resumed when appropriate by cardiology -On diltiazem at home, currently changed to Coreg   Essential hypertension Continue Hyzaar but decrease dose to 50 mg daily       DVT prophylaxis:  Code  Status: Full Family Communication: None at bedside Disposition: home  Status is: Inpatient    Consultants:  Cardiology/EP cardiology  Subjective:  Chest pain, no shortness of breath, no dizziness, no lightheadedness  Objective: Vitals:   12/08/21 1330 12/08/21 1415 12/08/21 1419 12/08/21 1458  BP: (!) 149/94 135/88  (!) 155/89  Pulse: (!) 52 (!) 49  (!) 49  Resp: 12 17  16   Temp:   97.8 F (36.6 C) 97.8 F (36.6 C)  TempSrc:   Oral Oral  SpO2: 99% 100%  97%    Intake/Output Summary (Last 24 hours) at 12/08/2021 1532 Last data filed at 12/08/2021 1014 Gross per 24 hour  Intake 3 ml  Output --  Net 3 ml   There were no vitals filed for this visit.  Examination:  General exam: Appears calm and comfortable  Respiratory system: Clear to auscultation. Respiratory effort normal. Cardiovascular system: S1 & S2 heard, RRR. No JVD, murmurs, rubs, gallops or clicks. No pedal edema. Gastrointestinal system: Abdomen is nondistended, soft and nontender. No organomegaly or masses felt. Normal bowel sounds heard. Central nervous system: Alert and oriented. No focal neurological deficits. Extremities: Symmetric 5 x 5 power. Skin: No rashes, lesions or ulcers Psychiatry: Judgement and insight appear normal. Mood & affect appropriate.     Data Reviewed: I have personally reviewed following labs and imaging studies  CBC: Recent Labs  Lab 12/03/21 1106 12/07/21 1644 12/08/21 0318 12/08/21 0949  WBC 5.8 7.8 5.4 4.7  HGB 13.6 13.1 11.4* 12.0*  HCT 38.7 42.1 36.8* 39.0  MCV 90 100.2* 97.6 99.5  PLT 314 271 267 270  Basic Metabolic Panel: Recent Labs  Lab 12/03/21 1106 12/07/21 1644 12/08/21 0318 12/08/21 0949  NA 137 141 139  --   K 4.1 4.0 3.6  --   CL 99 107 110  --   CO2 22 26 25   --   GLUCOSE 87 89 102*  --   BUN 9 17 15   --   CREATININE 0.91 1.18 1.06 1.05  CALCIUM 9.4 9.5 8.7*  --   MG  --  2.4  --   --     GFR: Estimated Creatinine Clearance: 88.4  mL/min (by C-G formula based on SCr of 1.05 mg/dL).  Liver Function Tests: Recent Labs  Lab 12/07/21 1644  AST 38  ALT 28  ALKPHOS 92  BILITOT 0.6  PROT 7.5  ALBUMIN 4.1    CBG: Recent Labs  Lab 12/08/21 0502  GLUCAP 103*     No results found for this or any previous visit (from the past 240 hour(s)).       Radiology Studies: DG Chest Portable 1 View  Result Date: 12/07/2021 CLINICAL DATA:  Chest pain. Syncope. Frequent runs of ventricular tachycardia on heart monitor. EXAM: PORTABLE CHEST 1 VIEW COMPARISON:  Multiple exams, including 05/16/2021 FINDINGS: The heart size and mediastinal contours are within normal limits. Both lungs are clear. The visualized skeletal structures are unremarkable. IMPRESSION: No active disease. Electronically Signed   By: Van Clines M.D.   On: 12/07/2021 17:07   ECHOCARDIOGRAM COMPLETE  Result Date: 12/08/2021    ECHOCARDIOGRAM REPORT   Patient Name:   John Keith [facility] Date of Exam: 12/08/2021 Medical Rec #:  [id number]       Height:       81.0 in Accession #:    [id number]      Weight:       253.8 lb Date of Birth:  1951/06/18       BSA:          2.567 m Patient Age:    43 years        BP:           108/57 mmHg Patient Gender: M               HR:           48 bpm. Exam Location:  Inpatient Procedure: 2D Echo, Color Doppler and Cardiac Doppler Indications:    Ventricular Tachycardia I47.2  History:        Patient has prior history of Echocardiogram examinations, most                 recent 04/04/2017. Arrythmias:Atrial Flutter,                 Signs/Symptoms:Chest Pain; Risk Factors:Hypertension,                 Dyslipidemia, Current Smoker and GERD.  Sonographer:    Bernadene Person RDCS Referring Phys: [place]  1. Left ventricular ejection fraction, by estimation, is 35 to 40%. The left ventricle has moderately decreased function. The left ventricle demonstrates global hypokinesis. The left ventricular internal cavity size was  mildly dilated. Left ventricular diastolic parameters are consistent with Grade II diastolic dysfunction (pseudonormalization).  2. Right ventricular systolic function is mildly reduced. The right ventricular size is mildly enlarged. There is normal pulmonary artery systolic pressure. The estimated right ventricular systolic pressure is 32.5 mmHg.  3. The mitral valve is grossly normal. Trivial mitral valve regurgitation. No evidence of  mitral stenosis.  4. The aortic valve is tricuspid. There is mild calcification of the aortic valve. There is mild thickening of the aortic valve. Aortic valve regurgitation is trivial. Aortic valve sclerosis is present, with no evidence of aortic valve stenosis.  5. The inferior vena cava is dilated in size with >50% respiratory variability, suggesting right atrial pressure of 8 mmHg. FINDINGS  Left Ventricle: Left ventricular ejection fraction, by estimation, is 35 to 40%. The left ventricle has moderately decreased function. The left ventricle demonstrates global hypokinesis. The left ventricular internal cavity size was mildly dilated. There is no left ventricular hypertrophy. Left ventricular diastolic parameters are consistent with Grade II diastolic dysfunction (pseudonormalization). Right Ventricle: The right ventricular size is mildly enlarged. No increase in right ventricular wall thickness. Right ventricular systolic function is mildly reduced. There is normal pulmonary artery systolic pressure. The tricuspid regurgitant velocity  is 2.15 m/s, and with an assumed right atrial pressure of 8 mmHg, the estimated right ventricular systolic pressure is 27.2 mmHg. Left Atrium: Left atrial size was normal in size. Right Atrium: Right atrial size was normal in size. Pericardium: Trivial pericardial effusion is present. Mitral Valve: The mitral valve is grossly normal. Trivial mitral valve regurgitation. No evidence of mitral valve stenosis. Tricuspid Valve: The tricuspid valve is  grossly normal. Tricuspid valve regurgitation is trivial. No evidence of tricuspid stenosis. Aortic Valve: The aortic valve is tricuspid. There is mild calcification of the aortic valve. There is mild thickening of the aortic valve. Aortic valve regurgitation is trivial. Aortic valve sclerosis is present, with no evidence of aortic valve stenosis. Pulmonic Valve: The pulmonic valve was grossly normal. Pulmonic valve regurgitation is trivial. No evidence of pulmonic stenosis. Aorta: The aortic root and ascending aorta are structurally normal, with no evidence of dilitation. Venous: The inferior vena cava is dilated in size with greater than 50% respiratory variability, suggesting right atrial pressure of 8 mmHg. IAS/Shunts: The atrial septum is grossly normal.  LEFT VENTRICLE PLAX 2D LVIDd:         5.80 cm      Diastology LVIDs:         4.30 cm      LV e' medial:    5.37 cm/s LV PW:         1.40 cm      LV E/e' medial:  11.9 LV IVS:        0.90 cm      LV e' lateral:   6.18 cm/s LVOT diam:     2.30 cm      LV E/e' lateral: 10.4 LV SV:         78 LV SV Index:   30 LVOT Area:     4.15 cm  LV Volumes (MOD) LV vol d, MOD A2C: 130.0 ml LV vol d, MOD A4C: 141.0 ml LV vol s, MOD A2C: 69.4 ml LV vol s, MOD A4C: 84.1 ml LV SV MOD A2C:     60.6 ml LV SV MOD A4C:     141.0 ml LV SV MOD BP:      58.8 ml RIGHT VENTRICLE RV S prime:     8.93 cm/s TAPSE (M-mode): 2.1 cm LEFT ATRIUM             Index        RIGHT ATRIUM           Index LA diam:        4.60 cm 1.79 cm/m   RA Area:  24.60 cm LA Vol (A2C):   79.0 ml 30.77 ml/m  RA Volume:   75.60 ml  29.45 ml/m LA Vol (A4C):   57.3 ml 22.32 ml/m LA Biplane Vol: 70.4 ml 27.42 ml/m  AORTIC VALVE LVOT Vmax:   83.00 cm/s LVOT Vmean:  56.200 cm/s LVOT VTI:    0.188 m  AORTA Ao Root diam: 3.90 cm Ao Asc diam:  3.90 cm MITRAL VALVE               TRICUSPID VALVE MV Area (PHT): 4.10 cm    TR Peak grad:   18.5 mmHg MV Decel Time: 185 msec    TR Vmax:        215.00 cm/s MV E velocity:  64.10 cm/s MV A velocity: 39.80 cm/s  SHUNTS MV E/A ratio:  1.61        Systemic VTI:  0.19 m                            Systemic Diam: 2.30 cm Eleonore Chiquito MD Electronically signed by Eleonore Chiquito MD Signature Date/Time: 12/08/2021/9:36:06 AM    Final    LONG TERM MONITOR (3-14 DAYS)  Result Date: 12/07/2021 Patch Wear Time:  13 days and 21 hours Predominant underlying rhythm was sinus rhythm Less than 1% atrial fibrillation burden, true burden difficult due to presence of artifact during episodes Multiple episodes of wide-complex tachycardia, appears due to ventricular tachycardia Longest episode of ventricular tachycardia 1 minute 30 seconds with a heart rate of 208 bpm Will Camnitz, MD       Scheduled Meds:  aspirin EC  81 mg Oral Daily   carvedilol  6.25 mg Oral BID WC   cholecalciferol  5,000 Units Oral Daily   ezetimibe  10 mg Oral Daily   ferrous sulfate  325 mg Oral Q breakfast   gabapentin  300 mg Oral TID   heparin injection (subcutaneous)  5,000 Units Subcutaneous Q8H   losartan  100 mg Oral Daily   multivitamin with minerals  1 tablet Oral Daily   pantoprazole  40 mg Oral Daily   sodium chloride flush  3 mL Intravenous Q12H   sodium chloride flush  3 mL Intravenous Q12H   sodium chloride flush  3 mL Intravenous Q12H   sodium chloride flush  3 mL Intravenous Q12H   Continuous Infusions:  sodium chloride     sodium chloride     sodium chloride 1 mL/kg/hr (12/08/21 0506)     LOS: 1 day        Phillips Climes, MD [facility]   To contact the attending provider between 7A-7P or the covering provider during after hours 7P-7A, please log into the web site [email or web address] and access using universal [employer] password for that web site. If you do not have the password, please call the hospital operator.  12/08/2021, 3:32 PM

## 2021-12-08 NOTE — Progress Notes (Signed)
  Echocardiogram 2D Echocardiogram has been performed.  Fidel Levy 12/08/2021, 9:04 AM

## 2021-12-08 NOTE — Plan of Care (Signed)
  Problem: Education: Goal: Understanding of CV disease, CV risk reduction, and recovery process will improve Outcome: Progressing   Problem: Activity: Goal: Ability to return to baseline activity level will improve Outcome: Progressing   Problem: Cardiovascular: Goal: Ability to achieve and maintain adequate cardiovascular perfusion will improve Outcome: Progressing Goal: Vascular access site(s) Level 0-1 will be maintained Outcome: Progressing   Problem: Health Behavior/Discharge Planning: Goal: Ability to safely manage health-related needs after discharge will improve Outcome: Progressing   Problem: Education: Goal: Knowledge of General Education information will improve Description: Including pain rating scale, medication(s)/side effects and non-pharmacologic comfort measures Outcome: Progressing   Problem: Clinical Measurements: Goal: Ability to maintain clinical measurements within normal limits will improve Outcome: Progressing Goal: Cardiovascular complication will be avoided Outcome: Progressing   Problem: Nutrition: Goal: Adequate nutrition will be maintained Outcome: Progressing

## 2021-12-08 NOTE — Interval H&P Note (Signed)
Cath Lab Visit (complete for each Cath Lab visit)  Clinical Evaluation Leading to the Procedure:   ACS: Yes.    Non-ACS:    Anginal Classification: CCS IV  Anti-ischemic medical therapy: Minimal Therapy (1 class of medications)  Non-Invasive Test Results: No non-invasive testing performed  Prior CABG: No previous CABG      History and Physical Interval Note:  12/08/2021 3:47 PM  John Keith  has presented today for surgery, with the diagnosis of unstable angina.  The various methods of treatment have been discussed with the patient and family. After consideration of risks, benefits and other options for treatment, the patient has consented to  Procedure(s): LEFT HEART CATH AND CORONARY ANGIOGRAPHY (N/A) as a surgical intervention.  The patient's history has been reviewed, patient examined, no change in status, stable for surgery.  I have reviewed the patient's chart and labs.  Questions were answered to the patient's satisfaction.     Lance Muss

## 2021-12-08 NOTE — H&P (View-Only) (Signed)
Progress Note  Patient Name: John Keith Date of Encounter: 12/08/2021  [facility] Cardiologist: [name], MD   Subjective   No complaints  Inpatient Medications    Scheduled Meds:  aspirin EC  81 mg Oral Daily   carvedilol  6.25 mg Oral BID WC   cholecalciferol  5,000 Units Oral Daily   ezetimibe  10 mg Oral Daily   ferrous sulfate  325 mg Oral Q breakfast   gabapentin  300 mg Oral TID   heparin injection (subcutaneous)  5,000 Units Subcutaneous Q8H   losartan  100 mg Oral Daily   multivitamin with minerals  1 tablet Oral Daily   pantoprazole  40 mg Oral Daily   sodium chloride flush  3 mL Intravenous Q12H   sodium chloride flush  3 mL Intravenous Q12H   sodium chloride flush  3 mL Intravenous Q12H   sodium chloride flush  3 mL Intravenous Q12H   Continuous Infusions:  sodium chloride     sodium chloride     sodium chloride 1 mL/kg/hr (12/08/21 0506)   PRN Meds: sodium chloride, sodium chloride, acetaminophen **OR** acetaminophen, ondansetron **OR** ondansetron (ZOFRAN) IV, sodium chloride flush, sodium chloride flush   Vital Signs    Vitals:   12/08/21 0500 12/08/21 0600 12/08/21 0615 12/08/21 0748  BP: 113/80 107/63 (!) 108/57 (!) 139/95  Pulse: (!) 56 (!) 51 (!) 51 (!) 52  Resp: 16 11 12    Temp:      TempSrc:      SpO2: 93% 95% 93%    No intake or output data in the 24 hours ending 12/08/21 0838    12/03/2021   10:01 AM 11/16/2021    9:25 AM 10/19/2021    8:46 AM  Last 3 Weights  Weight (lbs) 253 lb 12.8 oz 251 lb 253 lb  Weight (kg) 115.123 kg 113.853 kg 114.76 kg      Telemetry    SB 50's generally, intermittently his PVCs are frequent, sometimes trigeminy - Personally Reviewed No interval ventricular tachycardia or atrial arrhythmias ECG    No new EKGs - Personally Reviewed  Physical Exam   Examined by Dr. Caryl Comes GEN: No acute distress.   Neck: No JVD Cardiac: RRR, no murmurs, rubs, or gallops.  Respiratory: CTA b/l. GI:  Soft, nontender, non-distended  MS: No edema; No deformity. Neuro:  Nonfocal bilateral thenar weakness and wasting Psych: Normal affect   Labs    High Sensitivity Troponin:   Recent Labs  Lab 12/07/21 1556 12/07/21 1644  TROPONINIHS 39* 41*     Chemistry Recent Labs  Lab 12/03/21 1106 12/07/21 1644 12/08/21 0318  NA 137 141 139  K 4.1 4.0 3.6  CL 99 107 110  CO2 22 26 25   GLUCOSE 87 89 102*  BUN 9 17 15   CREATININE 0.91 1.18 1.06  CALCIUM 9.4 9.5 8.7*  MG  --  2.4  --   PROT  --  7.5  --   ALBUMIN  --  4.1  --   AST  --  38  --   ALT  --  28  --   ALKPHOS  --  92  --   BILITOT  --  0.6  --   GFRNONAA  --  >60 >60  ANIONGAP  --  8 4*    Lipids No results for input(s): CHOL, TRIG, HDL, LABVLDL, LDLCALC, CHOLHDL in the last 168 hours.  Hematology Recent Labs  Lab 12/03/21 1106 12/07/21 1644 12/08/21  0318  WBC 5.8 7.8 5.4  RBC 4.29 4.20* 3.77*  HGB 13.6 13.1 11.4*  HCT 38.7 42.1 36.8*  MCV 90 100.2* 97.6  MCH 31.7 31.2 30.2  MCHC 35.1 31.1 31.0  RDW 11.6 12.7 12.7  PLT 314 271 267   Thyroid  Recent Labs  Lab 12/03/21 1106  TSH 1.160    BNPNo results for input(s): BNP, PROBNP in the last 168 hours.  DDimer No results for input(s): DDIMER in the last 168 hours.   Radiology    DG Chest Portable 1 View  Result Date: 12/07/2021 CLINICAL DATA:  Chest pain. Syncope. Frequent runs of ventricular tachycardia on heart monitor. EXAM: PORTABLE CHEST 1 VIEW COMPARISON:  Multiple exams, including 05/16/2021 FINDINGS: The heart size and mediastinal contours are within normal limits. Both lungs are clear. The visualized skeletal structures are unremarkable. IMPRESSION: No active disease. Electronically Signed   By: Van Clines M.D.   On: 12/07/2021 17:07   Cardiac Studies   13 day monitor: May 2023 Predominant underlying rhythm was sinus rhythm Less than 1% atrial fibrillation burden, true burden difficult due to presence of artifact during  episodes Multiple episodes of wide-complex tachycardia, appears due to ventricular tachycardia Longest episode of ventricular tachycardia 1 minute 30 seconds with a heart rate of 208 bpm     06/11/2020: Coronary CT IMPRESSION: 1. Coronary calcium score of 23.1. This was 48th percentile for age and sex matched control. 2. Normal coronary origin with right dominance. 3. Mild calcifications in the proximal RCA, proximal and mid LAD causing minimal stenosis. 4. CAD-RADS 1. Minimal non-obstructive CAD (0-24%). Consider non-atherosclerotic causes of chest pain. Consider preventive therapy and risk factor modification.  Patient Profile     71 y.o. male HTN, HLDm smoker, Gastric ulcer w/bleed admitted with VT  Assessment & Plan    Recurrent syncope, near syncope Sustained and NS MMVT Planned for cath and echo today Myeloma panel c.MRI, probably tomorrow with some suspicion of amyloid No AAD yet  3. AFlutter CHA2DS2Vasc is 2, on Eliquis, held on admission for cath DCCV 4/17 Had some PAF on his monitor, maintaining SR here  For questions or updates, please contact [facility] Please consult [email or web address] for contact info under        Signed, Baldwin Jamaica, PA-C  12/08/2021, 8:38 AM     Syncope   Ventricular tachycardia   Atrial fibrillation    Hypertension   Left ventricular hypertrophy with low voltage QRS   Sinus bradycardia   PVCs right bundle inferior axis with a delayed intrinsicoid deflection  Carpal tunnel disease bilateral   There are some discrepancy in the history as to whether he was wearing the monitor when he had a syncopal event.  According to his daughter and into his agreement he was not wearing it.  The issue remains as noted previously what is the status of his heart muscle.  To this end we await echocardiogram, catheterization and with his low volts with LVH, bilateral carpal tunnel disease and orthostatic symptoms a cMRI.  I have discussed  this with Dr. Thermon Leyland who has suggested a myeloma panel and that the sensitivity is cMRI for all types of amyloid is sufficient that further PYP scanning would not be indicated if the cMRI is normal.  With his bradycardia, has not initiated therapy i.e. amiodarone.  For right now we will continue to follow-up

## 2021-12-08 NOTE — Progress Notes (Addendum)
 Progress Note  Patient Name: John Keith Date of Encounter: 12/08/2021  [facility] Cardiologist: Tiffany Otero, MD   Subjective   No complaints  Inpatient Medications    Scheduled Meds:  aspirin EC  81 mg Oral Daily   carvedilol  6.25 mg Oral BID WC   cholecalciferol  5,000 Units Oral Daily   ezetimibe  10 mg Oral Daily   ferrous sulfate  325 mg Oral Q breakfast   gabapentin  300 mg Oral TID   heparin injection (subcutaneous)  5,000 Units Subcutaneous Q8H   losartan  100 mg Oral Daily   multivitamin with minerals  1 tablet Oral Daily   pantoprazole  40 mg Oral Daily   sodium chloride flush  3 mL Intravenous Q12H   sodium chloride flush  3 mL Intravenous Q12H   sodium chloride flush  3 mL Intravenous Q12H   sodium chloride flush  3 mL Intravenous Q12H   Continuous Infusions:  sodium chloride     sodium chloride     sodium chloride 1 mL/kg/hr (12/08/21 0506)   PRN Meds: sodium chloride, sodium chloride, acetaminophen **OR** acetaminophen, ondansetron **OR** ondansetron (ZOFRAN) IV, sodium chloride flush, sodium chloride flush   Vital Signs    Vitals:   12/08/21 0500 12/08/21 0600 12/08/21 0615 12/08/21 0748  BP: 113/80 107/63 (!) 108/57 (!) 139/95  Pulse: (!) 56 (!) 51 (!) 51 (!) 52  Resp: 16 11 12   Temp:      TempSrc:      SpO2: 93% 95% 93%    No intake or output data in the 24 hours ending 12/08/21 0838    12/03/2021   10:01 AM 11/16/2021    9:25 AM 10/19/2021    8:46 AM  Last 3 Weights  Weight (lbs) 253 lb 12.8 oz 251 lb 253 lb  Weight (kg) 115.123 kg 113.853 kg 114.76 kg      Telemetry    SB 50's generally, intermittently his PVCs are frequent, sometimes trigeminy - Personally Reviewed No interval ventricular tachycardia or atrial arrhythmias ECG    No new EKGs - Personally Reviewed  Physical Exam   Examined by Dr. Elenore Wanninger GEN: No acute distress.   Neck: No JVD Cardiac: RRR, no murmurs, rubs, or gallops.  Respiratory: CTA b/l. GI:  Soft, nontender, non-distended  MS: No edema; No deformity. Neuro:  Nonfocal bilateral thenar weakness and wasting Psych: Normal affect   Labs    High Sensitivity Troponin:   Recent Labs  Lab 12/07/21 1556 12/07/21 1644  TROPONINIHS 39* 41*     Chemistry Recent Labs  Lab 12/03/21 1106 12/07/21 1644 12/08/21 0318  NA 137 141 139  K 4.1 4.0 3.6  CL 99 107 110  CO2 22 26 25  GLUCOSE 87 89 102*  BUN 9 17 15  CREATININE 0.91 1.18 1.06  CALCIUM 9.4 9.5 8.7*  MG  --  2.4  --   PROT  --  7.5  --   ALBUMIN  --  4.1  --   AST  --  38  --   ALT  --  28  --   ALKPHOS  --  92  --   BILITOT  --  0.6  --   GFRNONAA  --  >60 >60  ANIONGAP  --  8 4*    Lipids No results for input(s): CHOL, TRIG, HDL, LABVLDL, LDLCALC, CHOLHDL in the last 168 hours.  Hematology Recent Labs  Lab 12/03/21 1106 12/07/21 1644 12/08/21   0318  WBC 5.8 7.8 5.4  RBC 4.29 4.20* 3.77*  HGB 13.6 13.1 11.4*  HCT 38.7 42.1 36.8*  MCV 90 100.2* 97.6  MCH 31.7 31.2 30.2  MCHC 35.1 31.1 31.0  RDW 11.6 12.7 12.7  PLT 314 271 267   Thyroid  Recent Labs  Lab 12/03/21 1106  TSH 1.160    BNPNo results for input(s): BNP, PROBNP in the last 168 hours.  DDimer No results for input(s): DDIMER in the last 168 hours.   Radiology    DG Chest Portable 1 View  Result Date: 12/07/2021 CLINICAL DATA:  Chest pain. Syncope. Frequent runs of ventricular tachycardia on heart monitor. EXAM: PORTABLE CHEST 1 VIEW COMPARISON:  Multiple exams, including 05/16/2021 FINDINGS: The heart size and mediastinal contours are within normal limits. Both lungs are clear. The visualized skeletal structures are unremarkable. IMPRESSION: No active disease. Electronically Signed   By: Walter  Liebkemann M.D.   On: 12/07/2021 17:07   Cardiac Studies   13 day monitor: May 2023 Predominant underlying rhythm was sinus rhythm Less than 1% atrial fibrillation burden, true burden difficult due to presence of artifact during  episodes Multiple episodes of wide-complex tachycardia, appears due to ventricular tachycardia Longest episode of ventricular tachycardia 1 minute 30 seconds with a heart rate of 208 bpm     06/11/2020: Coronary CT IMPRESSION: 1. Coronary calcium score of 23.1. This was 48th percentile for age and sex matched control. 2. Normal coronary origin with right dominance. 3. Mild calcifications in the proximal RCA, proximal and mid LAD causing minimal stenosis. 4. CAD-RADS 1. Minimal non-obstructive CAD (0-24%). Consider non-atherosclerotic causes of chest pain. Consider preventive therapy and risk factor modification.  Patient Profile     71 y.o. male HTN, HLDm smoker, Gastric ulcer w/bleed admitted with VT  Assessment & Plan    Recurrent syncope, near syncope Sustained and NS MMVT Planned for cath and echo today Myeloma panel c.MRI, probably tomorrow with some suspicion of amyloid No AAD yet  3. AFlutter CHA2DS2Vasc is 2, on Eliquis, held on admission for cath DCCV 4/17 Had some PAF on his monitor, maintaining SR here  For questions or updates, please contact [facility] Please consult [email or web address] for contact info under        Signed, Renee Lynn Ursuy, PA-C  12/08/2021, 8:38 AM     Syncope   Ventricular tachycardia   Atrial fibrillation    Hypertension   Left ventricular hypertrophy with low voltage QRS   Sinus bradycardia   PVCs right bundle inferior axis with a delayed intrinsicoid deflection  Carpal tunnel disease bilateral   There are some discrepancy in the history as to whether he was wearing the monitor when he had a syncopal event.  According to his daughter and into his agreement he was not wearing it.  The issue remains as noted previously what is the status of his heart muscle.  To this end we await echocardiogram, catheterization and with his low volts with LVH, bilateral carpal tunnel disease and orthostatic symptoms a cMRI.  I have discussed  this with Dr. MCr who has suggested a myeloma panel and that the sensitivity is cMRI for all types of amyloid is sufficient that further PYP scanning would not be indicated if the cMRI is normal.  With his bradycardia, has not initiated therapy i.e. amiodarone.  For right now we will continue to follow-up 

## 2021-12-09 DIAGNOSIS — R55 Syncope and collapse: Secondary | ICD-10-CM | POA: Diagnosis not present

## 2021-12-09 LAB — BASIC METABOLIC PANEL
Anion gap: 8 (ref 5–15)
BUN: 12 mg/dL (ref 8–23)
CO2: 23 mmol/L (ref 22–32)
Calcium: 8.2 mg/dL — ABNORMAL LOW (ref 8.9–10.3)
Chloride: 108 mmol/L (ref 98–111)
Creatinine, Ser: 0.97 mg/dL (ref 0.61–1.24)
GFR, Estimated: >60 mL/min (ref >60)
Glucose, Bld: 97 mg/dL (ref 70–99)
Potassium: 3.9 mmol/L (ref 3.5–5.1)
Sodium: 139 mmol/L (ref 135–145)

## 2021-12-09 LAB — CBC
HCT: 35.1 % — ABNORMAL LOW (ref 39.0–52.0)
Hemoglobin: 11.5 g/dL — ABNORMAL LOW (ref 13.0–17.0)
MCH: 31.3 pg (ref 26.0–34.0)
MCHC: 32.8 g/dL (ref 30.0–36.0)
MCV: 95.6 fL (ref 80.0–100.0)
Platelets: 229 10*3/uL (ref 150–400)
RBC: 3.67 MIL/uL — ABNORMAL LOW (ref 4.22–5.81)
RDW: 12.7 % (ref 11.5–15.5)
WBC: 6.1 10*3/uL (ref 4.0–10.5)
nRBC: 0 % (ref 0.0–0.2)

## 2021-12-09 MED ORDER — CHLORHEXIDINE GLUCONATE 4 % EX LIQD
60.0000 mL | Freq: Once | CUTANEOUS | Status: AC
  Administered 2021-12-09: 4 via TOPICAL
  Filled 2021-12-09: qty 60

## 2021-12-09 MED ORDER — SODIUM CHLORIDE 0.9 % IV SOLN: INTRAVENOUS | Status: DC

## 2021-12-09 MED ORDER — CHLORHEXIDINE GLUCONATE 4 % EX LIQD
60.0000 mL | Freq: Once | CUTANEOUS | Status: AC
  Administered 2021-12-10: 4 via TOPICAL
  Filled 2021-12-09 (×2): qty 60

## 2021-12-09 MED ORDER — SACUBITRIL-VALSARTAN 24-26 MG PO TABS
1.0000 | ORAL_TABLET | Freq: Two times a day (BID) | ORAL | Status: DC
  Administered 2021-12-10 – 2021-12-11 (×3): 1 via ORAL
  Filled 2021-12-09 (×3): qty 1

## 2021-12-09 MED ORDER — SODIUM CHLORIDE 0.9 % IV SOLN
80.0000 mg | INTRAVENOUS | Status: AC
  Administered 2021-12-10: 80 mg
  Filled 2021-12-09: qty 2

## 2021-12-09 MED ORDER — CEFAZOLIN SODIUM-DEXTROSE 2-4 GM/100ML-% IV SOLN
2.0000 g | INTRAVENOUS | Status: AC
  Administered 2021-12-10: 2 g via INTRAVENOUS
  Filled 2021-12-09: qty 100

## 2021-12-09 MED ORDER — AMIODARONE HCL 200 MG PO TABS
400.0000 mg | ORAL_TABLET | Freq: Two times a day (BID) | ORAL | Status: DC
Start: 2021-12-09 — End: 2021-12-11
  Administered 2021-12-09 – 2021-12-11 (×5): 400 mg via ORAL
  Filled 2021-12-09 (×5): qty 2

## 2021-12-09 MED ORDER — SODIUM CHLORIDE 0.9% FLUSH: 3.0000 mL | INTRAVENOUS | Status: DC | PRN

## 2021-12-09 MED ORDER — SODIUM CHLORIDE 0.9% FLUSH
3.0000 mL | Freq: Two times a day (BID) | INTRAVENOUS | Status: DC
  Administered 2021-12-09: 3 mL via INTRAVENOUS

## 2021-12-09 MED ORDER — SODIUM CHLORIDE 0.9 % IV SOLN: 250.0000 mL | INTRAVENOUS | Status: DC

## 2021-12-09 NOTE — Progress Notes (Signed)
Progress Note  Patient Name: John Keith Date of Encounter: 12/09/2021  [facility] Cardiologist: [name], MD   Subjective   No complaints  Inpatient Medications    Scheduled Meds:  aspirin EC  81 mg Oral Daily   carvedilol  6.25 mg Oral BID WC   cholecalciferol  5,000 Units Oral Daily   ezetimibe  10 mg Oral Daily   ferrous sulfate  325 mg Oral Q breakfast   gabapentin  300 mg Oral TID   heparin  5,000 Units Subcutaneous Q8H   losartan  100 mg Oral Daily   multivitamin with minerals  1 tablet Oral Daily   pantoprazole  40 mg Oral Daily   sodium chloride flush  3 mL Intravenous Q12H   sodium chloride flush  3 mL Intravenous Q12H   sodium chloride flush  3 mL Intravenous Q12H   sodium chloride flush  3 mL Intravenous Q12H   sodium chloride flush  3 mL Intravenous Q12H   Continuous Infusions:  sodium chloride     sodium chloride     PRN Meds: sodium chloride, sodium chloride, acetaminophen, ondansetron (ZOFRAN) IV, ondansetron **OR** [DISCONTINUED] ondansetron (ZOFRAN) IV, sodium chloride flush, sodium chloride flush   Vital Signs    Vitals:   12/08/21 1951 12/08/21 2300 12/09/21 0342 12/09/21 0802  BP: 118/76 (!) 94/54 112/61 (!) 144/98  Pulse: (!) 53 (!) 54 (!) 53 62  Resp: 12 14 13 16   Temp: 97.8 F (36.6 C)  98 F (36.7 C) 97.8 F (36.6 C)  TempSrc:   Oral   SpO2: 98% 91% 97% 97%  Weight:   116 kg     Intake/Output Summary (Last 24 hours) at 12/09/2021 0915 Last data filed at 12/09/2021 0800 Gross per 24 hour  Intake 2763 ml  Output 500 ml  Net 2263 ml      12/09/2021    3:42 AM 12/03/2021   10:01 AM 11/16/2021    9:25 AM  Last 3 Weights  Weight (lbs) 255 lb 11.7 oz 253 lb 12.8 oz 251 lb  Weight (kg) 116 kg 115.123 kg 113.853 kg      Telemetry    SB 50's generally, intermittently his PVCs are frequent, sometimes trigeminy - Personally Reviewed No interval ventricular tachycardia or atrial arrhythmias ECG    No new EKGs -  Personally Reviewed  Physical Exam   Examined by Dr. Caryl Comes GEN: No acute distress.   Neck: No JVD Cardiac: RRR, no murmurs, rubs, or gallops.  Respiratory: CTA b/l. GI: Soft, nontender, non-distended  MS: No edema; No deformity. Neuro:  Nonfocal bilateral thenar weakness and wasting Psych: Normal affect   Labs    High Sensitivity Troponin:   Recent Labs  Lab 12/07/21 1556 12/07/21 1644  TROPONINIHS 39* 41*     Chemistry Recent Labs  Lab 12/07/21 1644 12/08/21 0318 12/08/21 0949 12/09/21 0454  NA 141 139  --  139  K 4.0 3.6  --  3.9  CL 107 110  --  108  CO2 26 25  --  23  GLUCOSE 89 102*  --  97  BUN 17 15  --  12  CREATININE 1.18 1.06 1.05 0.97  CALCIUM 9.5 8.7*  --  8.2*  MG 2.4  --   --   --   PROT 7.5  --   --   --   ALBUMIN 4.1  --   --   --   AST 38  --   --   --  ALT 28  --   --   --   ALKPHOS 92  --   --   --   BILITOT 0.6  --   --   --   GFRNONAA >60 >60 >60 >60  ANIONGAP 8 4*  --  8    Lipids No results for input(s): CHOL, TRIG, HDL, LABVLDL, LDLCALC, CHOLHDL in the last 168 hours.  Hematology Recent Labs  Lab 12/08/21 0318 12/08/21 0949 12/09/21 0454  WBC 5.4 4.7 6.1  RBC 3.77* 3.92* 3.67*  HGB 11.4* 12.0* 11.5*  HCT 36.8* 39.0 35.1*  MCV 97.6 99.5 95.6  MCH 30.2 30.6 31.3  MCHC 31.0 30.8 32.8  RDW 12.7 12.7 12.7  PLT 267 270 229   Thyroid  Recent Labs  Lab 12/03/21 1106  TSH 1.160    BNPNo results for input(s): BNP, PROBNP in the last 168 hours.  DDimer No results for input(s): DDIMER in the last 168 hours.   Radiology    DG Chest Portable 1 View  Result Date: 12/07/2021 CLINICAL DATA:  Chest pain. Syncope. Frequent runs of ventricular tachycardia on heart monitor. EXAM: PORTABLE CHEST 1 VIEW COMPARISON:  Multiple exams, including 05/16/2021 FINDINGS: The heart size and mediastinal contours are within normal limits. Both lungs are clear. The visualized skeletal structures are unremarkable. IMPRESSION: No active disease.  Electronically Signed   By: Van Clines M.D.   On: 12/07/2021 17:07   Cardiac Studies    12/08/21: LHC   Dist LAD lesion is 40% stenosed.   LV end diastolic pressure is normal.   There is no aortic valve stenosis.   Mild, nonobstructive CAD.     Standard catheters were just barely long enough to reach the coronaries and cross the aortic valve.  The patient is 6'9".     Continue w/u for nonsustained VT.   12/08/21: TTE  1. Left ventricular ejection fraction, by estimation, is 35 to 40%. The  left ventricle has moderately decreased function. The left ventricle  demonstrates global hypokinesis. The left ventricular internal cavity size  was mildly dilated. Left ventricular  diastolic parameters are consistent with Grade II diastolic dysfunction  (pseudonormalization).   2. Right ventricular systolic function is mildly reduced. The right  ventricular size is mildly enlarged. There is normal pulmonary artery  systolic pressure. The estimated right ventricular systolic pressure is  [id number] mmHg.   3. The mitral valve is grossly normal. Trivial mitral valve  regurgitation. No evidence of mitral stenosis.   4. The aortic valve is tricuspid. There is mild calcification of the  aortic valve. There is mild thickening of the aortic valve. Aortic valve  regurgitation is trivial. Aortic valve sclerosis is present, with no  evidence of aortic valve stenosis.   5. The inferior vena cava is dilated in size with >50% respiratory  variability, suggesting right atrial pressure of 8 mmHg.   13 day monitor: May 2023 Predominant underlying rhythm was sinus rhythm Less than 1% atrial fibrillation burden, true burden difficult due to presence of artifact during episodes Multiple episodes of wide-complex tachycardia, appears due to ventricular tachycardia Longest episode of ventricular tachycardia 1 minute 30 seconds with a heart rate of 208 bpm     06/11/2020: Coronary CT IMPRESSION: 1. Coronary  calcium score of 23.1. This was 48th percentile for age and sex matched control. 2. Normal coronary origin with right dominance. 3. Mild calcifications in the proximal RCA, proximal and mid LAD causing minimal stenosis. 4. CAD-RADS 1. Minimal non-obstructive CAD (  0-24%). Consider non-atherosclerotic causes of chest pain. Consider preventive therapy and risk factor modification.  Patient Profile     71 y.o. male HTN, HLDm smoker, Gastric ulcer w/bleed admitted with VT  Assessment & Plan    Recurrent syncope, near syncope Sustained and NS MMVT Myeloma panel is pending LVEF 35-40% Cath with NOD C.MRI abnormal (as above), will need PET scan out patient Dr. Caryl Comes will discuss case with colleagues, HF team for thoughts/recommendations We will in the interim start amiodarone and plan for ICD  Will change his losartan to Entresto Add amiodarone  NPO after MN for possible ICD tomorrow  3. AFlutter CHA2DS2Vasc is 2, on Eliquis, held on admission for cath DCCV 4/17 Had some PAF on his monitor, maintaining SR here Will continue to hold off [place] until timing on ICD is clarified  For questions or updates, please contact [facility] Please consult [email or web address] for contact info under        Signed, Baldwin Jamaica, PA-C  12/09/2021, 9:15 AM

## 2021-12-09 NOTE — TOC Progression Note (Signed)
Transition of Care [facility]) - Progression Note    Patient Details  Name: John Keith MRN: [id number] Date of Birth: 1950-07-24  [facility]) CM/SW Contact  Leone Haven, RN Phone Number: 12/09/2021, 1:03 PM  Clinical Narrative:    from home, syncope, Vtach, change losartan to entresto, for defibrillator tomorrow.  TOC will continue to follow for dc needs.         Expected Discharge Plan and Services                                                 Social Determinants of Health (SDOH) Interventions    Readmission Risk Interventions     View : No data to display.

## 2021-12-09 NOTE — Progress Notes (Signed)
PROGRESS NOTE    RALPHEAL SURGENER  CLE:[id number] DOB: Dec 09, 1950 DOA: 12/07/2021 PCP: [name], Pasty Spillers, MD   Brief Narrative: No notes on file   Assessment and Plan:  Syncope Multiple events. Zio patch significant for multiple episodes of sustained ventricular tachycardia as most likely etiology. Transthoracic Echocardiogram obtained and significant for an EF of 35-40%. EP planning for AICD.  Ventricular tachycardia Atrial flutter Management per EP  Primary hypertension Patient started on Entresto  DVT prophylaxis: Per primary Code Status:   Code Status: Full Code Family Communication: None at bedside Disposition Plan: Per primary    Procedures:  Transthoracic Echocardiogram   Antimicrobials: None    Subjective: Patient reports no concerns/issues overnight.  Objective: BP (!) 140/92 (BP Location: Left Arm)   Pulse (!) 55   Temp 98.1 F (36.7 C) (Oral)   Resp 16   Wt 116 kg   SpO2 96%   BMI 27.40 kg/m   Examination:  General exam: Appears calm and comfortable  Respiratory system: Clear to auscultation. Respiratory effort normal. Cardiovascular system: S1 & S2 heard Gastrointestinal system: Abdomen is nondistended, soft and nontender. Normal bowel sounds heard. Central nervous system: Alert and oriented. No focal neurological deficits. Musculoskeletal: No edema. No calf tenderness Skin: No cyanosis. No rashes Psychiatry: Judgement and insight appear normal. Mood & affect appropriate.    Data Reviewed: I have personally reviewed following labs and imaging studies  CBC Lab Results  Component Value Date   WBC 6.1 12/09/2021   RBC 3.67 (L) 12/09/2021   HGB 11.5 (L) 12/09/2021   HCT 35.1 (L) 12/09/2021   MCV 95.6 12/09/2021   MCH 31.3 12/09/2021   PLT 229 12/09/2021   MCHC 32.8 12/09/2021   RDW 12.7 12/09/2021   LYMPHSABS 1.9 08/27/2021   MONOABS 0.6 08/27/2021   EOSABS 0.2 08/27/2021   BASOSABS 0.0 08/27/2021     Last metabolic  panel Lab Results  Component Value Date   NA 139 12/09/2021   K 3.9 12/09/2021   CL 108 12/09/2021   CO2 23 12/09/2021   BUN 12 12/09/2021   CREATININE 0.97 12/09/2021   GLUCOSE 97 12/09/2021   GFRNONAA >60 12/09/2021   GFRAA 83 10/02/2018   CALCIUM 8.2 (L) 12/09/2021   PROT 7.5 12/07/2021   ALBUMIN 4.1 12/07/2021   LABGLOB 2.6 06/03/2016   AGRATIO 1.7 06/03/2016   BILITOT 0.6 12/07/2021   ALKPHOS 92 12/07/2021   AST 38 12/07/2021   ALT 28 12/07/2021   ANIONGAP 8 12/09/2021    GFR: Estimated Creatinine Clearance: 95.7 mL/min (by C-G formula based on SCr of 0.97 mg/dL).  No results found for this or any previous visit (from the past 240 hour(s)).    Radiology Studies: CARDIAC CATHETERIZATION  Result Date: 12/08/2021   Dist LAD lesion is 40% stenosed.   LV end diastolic pressure is normal.   There is no aortic valve stenosis. Mild, nonobstructive CAD.  Standard catheters were just barely long enough to reach the coronaries and cross the aortic valve.  The patient is 6'9".  Continue w/u for nonsustained VT.   DG Chest Portable 1 View  Result Date: 12/07/2021 CLINICAL DATA:  Chest pain. Syncope. Frequent runs of ventricular tachycardia on heart monitor. EXAM: PORTABLE CHEST 1 VIEW COMPARISON:  Multiple exams, including 05/16/2021 FINDINGS: The heart size and mediastinal contours are within normal limits. Both lungs are clear. The visualized skeletal structures are unremarkable. IMPRESSION: No active disease. Electronically Signed   By: Annitta Needs.D.  On: 12/07/2021 17:07   MR CARDIAC MORPHOLOGY W WO CONTRAST  Result Date: 12/09/2021 CLINICAL DATA:  29M p/w VT. Nonobstructive CAD on cath. Echo with EF 35-40% EXAM: CARDIAC MRI TECHNIQUE: The patient was scanned on a 1.5 Tesla Siemens magnet. A dedicated cardiac coil was used. Functional imaging was done using Fiesta sequences. 2,3, and 4 chamber views were done to assess for RWMA's. Modified Simpson's rule using a short  axis stack was used to calculate an ejection fraction on a dedicated work Conservation officer, nature. The patient received 15 cc of Gadavist. After 10 minutes inversion recovery sequences were used to assess for infiltration and scar tissue. CONTRAST:  15 cc  of Gadavist FINDINGS: Left ventricle: -Mild hypertrophy -Mild dilatation -Moderate systolic dysfunction -Nonspecific ECV elevation (34%) -Elevated T2 values in lateral wall -Midwall LGE in basal septum and basal inferior wall. RV insertion site LGE LV EF: 33% (Normal 56-78%) Absolute volumes: LV EDV: 280mL (Normal 77-195 mL) LV ESV: 135mL (Normal 19-72 mL) LV SV: 101mL (Normal 51-133 mL) CO: 4.3L/min (Normal 2.8-8.8 L/min) Indexed volumes: LV EDV: 63mL/sq-m (Normal 47-92 mL/sq-m) LV ESV: 64mL/sq-m (Normal 13-30 mL/sq-m) LV SV: 1mL/sq-m (Normal 32-62 mL/sq-m) CI: 1.7L/min/sq-m (Normal 1.7-4.2 L/min/sq-m) Right ventricle: Normal size with mild systolic dysfunction RV EF:  40% (Normal 47-74%) Absolute volumes: RV EDV: 238mL (Normal 88-227 mL) RV ESV: 176mL (Normal 23-103 mL) RV SV: 67mL (Normal 52-138 mL) CO: 4.5L/min (Normal 2.8-8.8 L/min) Indexed volumes: RV EDV: 37mL/sq-m (Normal 55-105 mL/sq-m) RV ESV: 23mL/sq-m (Normal 15-43 mL/sq-m) RV SV: 11mL/sq-m (Normal 32-64 mL/sq-m) CI: 1.7L/min/sq-m (Normal 1.7-4.2 L/min/sq-m) Left atrium: Moderate enlargement Right atrium: Normal size Mitral valve: No regurgitation Aortic valve: No regurgitation Tricuspid valve: Trivial regurgitation Pulmonic valve: No regurgitation Aorta: Mild dilatation ascending aorta measuring 2mm Pericardium: Normal IMPRESSION: 1. Basal septal midwall late gadolinium enhancement, which is a scar pattern seen in nonischemic cardiomyopathies and associated with worse prognosis. There is also a focal area of LGE in basal inferior wall, as well as RV insertion site LGE (which is a nonspecific scar pattern often seen in setting of elevated pulmonary pressures). Given multifocal areas of LGE with  high intensity in the basal septal LGE, and considering myocardial T2 values were elevated in lateral wall concerning for myocardial inflammation, raises concern for sarcoidosis. Recommend cardiac PET for further evaluation 2. Mild LV dilation, mild hypertrophy, and moderate systolic dysfunction (EF [id number]) 3.  Normal RV size with mild systolic dysfunction (EF [id number]) Electronically Signed   By: Oswaldo Milian M.D.   On: 12/09/2021 06:07   ECHOCARDIOGRAM COMPLETE  Result Date: 12/08/2021    ECHOCARDIOGRAM REPORT   Patient Name:   John Keith [facility] Date of Exam: 12/08/2021 Medical Rec #:  [id number]       Height:       81.0 in Accession #:    [id number]      Weight:       253.8 lb Date of Birth:  [birth year]       BSA:          2.567 m Patient Age:    71 years        BP:           108/57 mmHg Patient Gender: M               HR:           48 bpm. Exam Location:  Inpatient Procedure: 2D Echo, Color Doppler and Cardiac Doppler Indications:    Ventricular Tachycardia I47.2  History:        Patient has prior history of Echocardiogram examinations, most                 recent 04/04/2017. Arrythmias:Atrial Flutter,                 Signs/Symptoms:Chest Pain; Risk Factors:Hypertension,                 Dyslipidemia, Current Smoker and GERD.  Sonographer:    Bernadene Person RDCS Referring Phys: [place]  1. Left ventricular ejection fraction, by estimation, is 35 to 40%. The left ventricle has moderately decreased function. The left ventricle demonstrates global hypokinesis. The left ventricular internal cavity size was mildly dilated. Left ventricular diastolic parameters are consistent with Grade II diastolic dysfunction (pseudonormalization).  2. Right ventricular systolic function is mildly reduced. The right ventricular size is mildly enlarged. There is normal pulmonary artery systolic pressure. The estimated right ventricular systolic pressure is A999333 mmHg.  3. The mitral valve is grossly normal. Trivial  mitral valve regurgitation. No evidence of mitral stenosis.  4. The aortic valve is tricuspid. There is mild calcification of the aortic valve. There is mild thickening of the aortic valve. Aortic valve regurgitation is trivial. Aortic valve sclerosis is present, with no evidence of aortic valve stenosis.  5. The inferior vena cava is dilated in size with >50% respiratory variability, suggesting right atrial pressure of 8 mmHg. FINDINGS  Left Ventricle: Left ventricular ejection fraction, by estimation, is 35 to 40%. The left ventricle has moderately decreased function. The left ventricle demonstrates global hypokinesis. The left ventricular internal cavity size was mildly dilated. There is no left ventricular hypertrophy. Left ventricular diastolic parameters are consistent with Grade II diastolic dysfunction (pseudonormalization). Right Ventricle: The right ventricular size is mildly enlarged. No increase in right ventricular wall thickness. Right ventricular systolic function is mildly reduced. There is normal pulmonary artery systolic pressure. The tricuspid regurgitant velocity  is 2.15 m/s, and with an assumed right atrial pressure of 8 mmHg, the estimated right ventricular systolic pressure is [id number] mmHg. Left Atrium: Left atrial size was normal in size. Right Atrium: Right atrial size was normal in size. Pericardium: Trivial pericardial effusion is present. Mitral Valve: The mitral valve is grossly normal. Trivial mitral valve regurgitation. No evidence of mitral valve stenosis. Tricuspid Valve: The tricuspid valve is grossly normal. Tricuspid valve regurgitation is trivial. No evidence of tricuspid stenosis. Aortic Valve: The aortic valve is tricuspid. There is mild calcification of the aortic valve. There is mild thickening of the aortic valve. Aortic valve regurgitation is trivial. Aortic valve sclerosis is present, with no evidence of aortic valve stenosis. Pulmonic Valve: The pulmonic valve was grossly  normal. Pulmonic valve regurgitation is trivial. No evidence of pulmonic stenosis. Aorta: The aortic root and ascending aorta are structurally normal, with no evidence of dilitation. Venous: The inferior vena cava is dilated in size with greater than 50% respiratory variability, suggesting right atrial pressure of 8 mmHg. IAS/Shunts: The atrial septum is grossly normal.  LEFT VENTRICLE PLAX 2D LVIDd:         5.80 cm      Diastology LVIDs:         4.30 cm      LV e' medial:    5.37 cm/s LV PW:         1.40 cm      LV E/e' medial:  11.9 LV IVS:  0.90 cm      LV e' lateral:   6.18 cm/s LVOT diam:     2.30 cm      LV E/e' lateral: 10.4 LV SV:         78 LV SV Index:   30 LVOT Area:     4.15 cm  LV Volumes (MOD) LV vol d, MOD A2C: 130.0 ml LV vol d, MOD A4C: 141.0 ml LV vol s, MOD A2C: 69.4 ml LV vol s, MOD A4C: 84.1 ml LV SV MOD A2C:     60.6 ml LV SV MOD A4C:     141.0 ml LV SV MOD BP:      58.8 ml RIGHT VENTRICLE RV S prime:     8.93 cm/s TAPSE (M-mode): 2.1 cm LEFT ATRIUM             Index        RIGHT ATRIUM           Index LA diam:        4.60 cm 1.79 cm/m   RA Area:     24.60 cm LA Vol (A2C):   79.0 ml 30.77 ml/m  RA Volume:   75.60 ml  29.45 ml/m LA Vol (A4C):   57.3 ml 22.32 ml/m LA Biplane Vol: 70.4 ml 27.42 ml/m  AORTIC VALVE LVOT Vmax:   83.00 cm/s LVOT Vmean:  56.200 cm/s LVOT VTI:    0.188 m  AORTA Ao Root diam: 3.90 cm Ao Asc diam:  3.90 cm MITRAL VALVE               TRICUSPID VALVE MV Area (PHT): 4.10 cm    TR Peak grad:   18.5 mmHg MV Decel Time: 185 msec    TR Vmax:        215.00 cm/s MV E velocity: 64.10 cm/s MV A velocity: 39.80 cm/s  SHUNTS MV E/A ratio:  1.61        Systemic VTI:  0.19 m                            Systemic Diam: 2.30 cm Eleonore Chiquito MD Electronically signed by Eleonore Chiquito MD Signature Date/Time: 12/08/2021/9:36:06 AM    Final       LOS: 2 days    Cordelia Poche, MD [facility] 12/09/2021, 5:00 PM   If 7PM-7AM, please contact  night-coverage [email or web address]

## 2021-12-10 ENCOUNTER — Other Ambulatory Visit (HOSPITAL_COMMUNITY): Payer: Self-pay

## 2021-12-10 ENCOUNTER — Inpatient Hospital Stay (HOSPITAL_COMMUNITY): Payer: PPO

## 2021-12-10 ENCOUNTER — Ambulatory Visit: Payer: PPO | Admitting: Podiatry

## 2021-12-10 ENCOUNTER — Inpatient Hospital Stay (HOSPITAL_COMMUNITY)
Admission: EM | Disposition: A | Discharge status: Home / Self Care | Payer: Self-pay | Source: Home / Self Care | Attending: Internal Medicine

## 2021-12-10 DIAGNOSIS — I483 Typical atrial flutter: Secondary | ICD-10-CM

## 2021-12-10 DIAGNOSIS — I472 Ventricular tachycardia, unspecified: Secondary | ICD-10-CM | POA: Diagnosis not present

## 2021-12-10 DIAGNOSIS — I428 Other cardiomyopathies: Secondary | ICD-10-CM | POA: Diagnosis not present

## 2021-12-10 HISTORY — PX: ICD IMPLANT: EP1208

## 2021-12-10 LAB — SURGICAL PCR SCREEN
MRSA, PCR: NEGATIVE
Staphylococcus aureus: NEGATIVE

## 2021-12-10 LAB — LIPOPROTEIN A (LPA): Lipoprotein (a): 46.4 nmol/L — ABNORMAL HIGH (ref <75.0)

## 2021-12-10 SURGERY — ICD IMPLANT

## 2021-12-10 MED ORDER — CEFAZOLIN SODIUM-DEXTROSE 1-4 GM/50ML-% IV SOLN
1.0000 g | Freq: Four times a day (QID) | INTRAVENOUS | Status: AC
  Administered 2021-12-10 – 2021-12-11 (×3): 1 g via INTRAVENOUS
  Filled 2021-12-10 (×3): qty 50

## 2021-12-10 MED ORDER — HEPARIN (PORCINE) IN NACL 1000-0.9 UT/500ML-% IV SOLN
INTRAVENOUS | Status: DC | PRN
  Administered 2021-12-10: 500 mL

## 2021-12-10 MED ORDER — MIDAZOLAM HCL 5 MG/5ML IJ SOLN
INTRAMUSCULAR | Status: DC | PRN
  Administered 2021-12-10 (×2): 1 mg via INTRAVENOUS

## 2021-12-10 MED ORDER — CEFAZOLIN SODIUM-DEXTROSE 2-4 GM/100ML-% IV SOLN
INTRAVENOUS | Status: AC
  Filled 2021-12-10: qty 100

## 2021-12-10 MED ORDER — FENTANYL CITRATE (PF) 100 MCG/2ML IJ SOLN
INTRAMUSCULAR | Status: AC
  Filled 2021-12-10: qty 2

## 2021-12-10 MED ORDER — MIDAZOLAM HCL 5 MG/5ML IJ SOLN
INTRAMUSCULAR | Status: AC
  Filled 2021-12-10: qty 5

## 2021-12-10 MED ORDER — LIDOCAINE HCL (PF) 1 % IJ SOLN
INTRAMUSCULAR | Status: DC | PRN
  Administered 2021-12-10: 60 mL

## 2021-12-10 MED ORDER — SODIUM CHLORIDE 0.9 % IV SOLN
INTRAVENOUS | Status: AC
  Filled 2021-12-10: qty 2

## 2021-12-10 MED ORDER — LIDOCAINE HCL (PF) 1 % IJ SOLN
INTRAMUSCULAR | Status: AC
  Filled 2021-12-10: qty 60

## 2021-12-10 MED ORDER — ONDANSETRON HCL 4 MG/2ML IJ SOLN: 4.0000 mg | Freq: Four times a day (QID) | INTRAMUSCULAR | Status: DC | PRN

## 2021-12-10 MED ORDER — TECHNETIUM TC 99M PYROPHOSPHATE
21.2000 | Freq: Once | INTRAVENOUS | Status: AC | PRN
  Administered 2021-12-10: 21.2 via INTRAVENOUS

## 2021-12-10 MED ORDER — FENTANYL CITRATE (PF) 100 MCG/2ML IJ SOLN
INTRAMUSCULAR | Status: DC | PRN
Start: 2021-12-10 — End: 2021-12-10
  Administered 2021-12-10 (×2): 25 ug via INTRAVENOUS

## 2021-12-10 MED ORDER — ACETAMINOPHEN 325 MG PO TABS: 325.0000 mg | ORAL_TABLET | ORAL | Status: DC | PRN

## 2021-12-10 SURGICAL SUPPLY — 8 items
CABLE SURGICAL S-101-97-12 (CABLE) ×2 IMPLANT
ICD VISIA MRI VR DVFB1D4 (ICD Generator) IMPLANT
LEAD SPRINT QUAT SEC 6935M-72 (Lead) ×1 IMPLANT
PAD DEFIB RADIO PHYSIO CONN (PAD) ×2 IMPLANT
SHEATH 9FR PRELUDE SNAP 13 (SHEATH) ×1 IMPLANT
SHEATH PROBE COVER 6X72 (BAG) ×1 IMPLANT
TRAY PACEMAKER INSERTION (PACKS) ×2 IMPLANT
VISIA MRI VR DVFB1D4 (ICD Generator) ×2 IMPLANT

## 2021-12-10 NOTE — H&P (View-Only) (Signed)
Progress Note  Patient Name: John Keith Date of Encounter: 12/10/2021  [facility] Cardiologist: [name], MD   Subjective   No complaints, getting a bit of cabin fever, hopes to go home soon  Inpatient Medications    Scheduled Meds:  amiodarone  400 mg Oral BID   aspirin EC  81 mg Oral Daily   carvedilol  6.25 mg Oral BID WC   cholecalciferol  5,000 Units Oral Daily   ezetimibe  10 mg Oral Daily   ferrous sulfate  325 mg Oral Q breakfast   gabapentin  300 mg Oral TID   gentamicin (GARAMYCIN) 80 mg in sodium chloride 0.9 % 500 mL irrigation  80 mg Irrigation On Call   multivitamin with minerals  1 tablet Oral Daily   pantoprazole  40 mg Oral Daily   sacubitril-valsartan  1 tablet Oral BID   sodium chloride flush  3 mL Intravenous Q12H   Continuous Infusions:  sodium chloride     sodium chloride 50 mL/hr at 12/10/21 0631   sodium chloride      ceFAZolin (ANCEF) IV     PRN Meds: sodium chloride, acetaminophen, ondansetron (ZOFRAN) IV, ondansetron **OR** [DISCONTINUED] ondansetron (ZOFRAN) IV, sodium chloride flush, sodium chloride flush   Vital Signs    Vitals:   12/09/21 2332 12/10/21 0511 12/10/21 0538 12/10/21 0717  BP: 137/79 (!) 141/95  (!) 141/94  Pulse: (!) 57 (!) 55  68  Resp: 15 16  17   Temp: 98.1 F (36.7 C) 98 F (36.7 C)  97.9 F (36.6 C)  TempSrc: Oral Oral  Oral  SpO2: 98% 94%  93%  Weight:   111.9 kg     Intake/Output Summary (Last 24 hours) at 12/10/2021 0919 Last data filed at 12/10/2021 0814 Gross per 24 hour  Intake 1017 ml  Output 400 ml  Net 617 ml      12/10/2021    5:38 AM 12/09/2021    3:42 AM 12/03/2021   10:01 AM  Last 3 Weights  Weight (lbs) 246 lb 11.1 oz 255 lb 11.7 oz 253 lb 12.8 oz  Weight (kg) 111.9 kg 116 kg 115.123 kg      Telemetry    SB 50's generally, less PVCs, no VT- Personally Reviewed  ECG    No new EKGs - Personally Reviewed  Physical Exam   Unchanged exam GEN: No acute distress.   Neck:  No JVD Cardiac: RRR, no murmurs, rubs, or gallops.  Respiratory: CTA b/l. GI: Soft, nontender, non-distended  MS: No edema; No deformity. Neuro:  Nonfocal bilateral thenar weakness and wasting Psych: Normal affect   Labs    High Sensitivity Troponin:   Recent Labs  Lab 12/07/21 1556 12/07/21 1644  TROPONINIHS 39* 41*     Chemistry Recent Labs  Lab 12/07/21 1644 12/08/21 0318 12/08/21 0949 12/09/21 0454  NA 141 139  --  139  K 4.0 3.6  --  3.9  CL 107 110  --  108  CO2 26 25  --  23  GLUCOSE 89 102*  --  97  BUN 17 15  --  12  CREATININE 1.18 1.06 1.05 0.97  CALCIUM 9.5 8.7*  --  8.2*  MG 2.4  --   --   --   PROT 7.5  --   --   --   ALBUMIN 4.1  --   --   --   AST 38  --   --   --  ALT 28  --   --   --   ALKPHOS 92  --   --   --   BILITOT 0.6  --   --   --   GFRNONAA >60 >60 >60 >60  ANIONGAP 8 4*  --  8    Lipids No results for input(s): "CHOL", "TRIG", "HDL", "LABVLDL", "LDLCALC", "CHOLHDL" in the last 168 hours.  Hematology Recent Labs  Lab 12/08/21 0318 12/08/21 0949 12/09/21 0454  WBC 5.4 4.7 6.1  RBC 3.77* 3.92* 3.67*  HGB 11.4* 12.0* 11.5*  HCT 36.8* 39.0 35.1*  MCV 97.6 99.5 95.6  MCH 30.2 30.6 31.3  MCHC 31.0 30.8 32.8  RDW 12.7 12.7 12.7  PLT 267 270 229   Thyroid  Recent Labs  Lab 12/03/21 1106  TSH 1.160    BNPNo results for input(s): "BNP", "PROBNP" in the last 168 hours.  DDimer No results for input(s): "DDIMER" in the last 168 hours.   Radiology    DG Chest Portable 1 View  Result Date: 12/07/2021 CLINICAL DATA:  Chest pain. Syncope. Frequent runs of ventricular tachycardia on heart monitor. EXAM: PORTABLE CHEST 1 VIEW COMPARISON:  Multiple exams, including 05/16/2021 FINDINGS: The heart size and mediastinal contours are within normal limits. Both lungs are clear. The visualized skeletal structures are unremarkable. IMPRESSION: No active disease. Electronically Signed   By: Van Clines M.D.   On: 12/07/2021 17:07   Cardiac  Studies   12/08/21: c.MRI IMPRESSION: 1. Basal septal midwall late gadolinium enhancement, which is a scar pattern seen in nonischemic cardiomyopathies and associated with worse prognosis. There is also a focal area of LGE in basal inferior wall, as well as RV insertion site LGE (which is a nonspecific scar pattern often seen in setting of elevated pulmonary pressures). Given multifocal areas of LGE with high intensity in the basal septal LGE, and considering myocardial T2 values were elevated in lateral wall concerning for myocardial inflammation, raises concern for sarcoidosis. Recommend cardiac PET for further evaluation   2. Mild LV dilation, mild hypertrophy, and moderate systolic dysfunction (EF [id number])   3.  Normal RV size with mild systolic dysfunction (EF [id number])   12/08/21: LHC   Dist LAD lesion is 40% stenosed.   LV end diastolic pressure is normal.   There is no aortic valve stenosis.   Mild, nonobstructive CAD.     Standard catheters were just barely long enough to reach the coronaries and cross the aortic valve.  The patient is 6'9".     Continue w/u for nonsustained VT.   12/08/21: TTE  1. Left ventricular ejection fraction, by estimation, is 35 to 40%. The  left ventricle has moderately decreased function. The left ventricle  demonstrates global hypokinesis. The left ventricular internal cavity size  was mildly dilated. Left ventricular  diastolic parameters are consistent with Grade II diastolic dysfunction  (pseudonormalization).   2. Right ventricular systolic function is mildly reduced. The right  ventricular size is mildly enlarged. There is normal pulmonary artery  systolic pressure. The estimated right ventricular systolic pressure is  A999333 mmHg.   3. The mitral valve is grossly normal. Trivial mitral valve  regurgitation. No evidence of mitral stenosis.   4. The aortic valve is tricuspid. There is mild calcification of the  aortic valve. There is mild  thickening of the aortic valve. Aortic valve  regurgitation is trivial. Aortic valve sclerosis is present, with no  evidence of aortic valve stenosis.   5. The inferior vena cava  is dilated in size with >50% respiratory  variability, suggesting right atrial pressure of 8 mmHg.   13 day monitor: May 2023 Predominant underlying rhythm was sinus rhythm Less than 1% atrial fibrillation burden, true burden difficult due to presence of artifact during episodes Multiple episodes of wide-complex tachycardia, appears due to ventricular tachycardia Longest episode of ventricular tachycardia 1 minute 30 seconds with a heart rate of 208 bpm     06/11/2020: Coronary CT IMPRESSION: 1. Coronary calcium score of 23.1. This was 48th percentile for age and sex matched control. 2. Normal coronary origin with right dominance. 3. Mild calcifications in the proximal RCA, proximal and mid LAD causing minimal stenosis. 4. CAD-RADS 1. Minimal non-obstructive CAD (0-24%). Consider non-atherosclerotic causes of chest pain. Consider preventive therapy and risk factor modification.  Patient Profile     71 y.o. male HTN, HLDm smoker, Gastric ulcer w/bleed admitted with VT  Assessment & Plan    Recurrent syncope, near syncope Sustained and NS MMVT Myeloma panel is pending LVEF 35-40% Cath with NOD C.MRI abnormal (as above), Kairee Isa need PET scan out patient Dr. Caryl Comes Kerstyn Coryell discuss case with colleagues, HF team for thoughts/recommendations We Cailen Texeira in the interim start amiodarone and plan for ICD  Dr. Curt Bears has seen the patient this AM, revisited rational for ICD, the procedure, potential risks/benefits He remains agreeable to proceed. In d/w Dr. Caryl Comes and his review of the case with HF MD, recommends as well getting an amyloid (PYP) scan today (ordered) They Gwyn Mehring be happy to follow him up outpatient and plan for outpt PET No other recommendations at this juncture  PO amiodarone started yesterday ARB >  ARNI yesterday Aloysious Vangieson up-titrate BB post implant   3. AFlutter CHA2DS2Vasc is 2, on Eliquis, held on admission for cath DCCV 4/17 Had some PAF on his monitor, maintaining SR here Anajulia Leyendecker continue to hold off [place] until post implant pending pocket stability     For questions or updates, please contact [place] Please consult [email or web address] for contact info under        Signed, Baldwin [place], PA-C  12/10/2021, 9:19 AM    I have seen and examined this patient with Tommye Standard.  Agree with above, note added to reflect my findings.  Currently feeling well.  No further ventricular arrhythmias.  GEN: Well nourished, well developed, in no acute distress  HEENT: normal  Neck: no JVD, carotid bruits, or masses Cardiac: RRR; no murmurs, rubs, or gallops,no edema  Respiratory:  clear to auscultation bilaterally, normal work of breathing GI: soft, nontender, nondistended, + BS MS: no deformity or atrophy  Skin: warm and dry Neuro:  Strength and sensation are intact Psych: euthymic mood, full affect   Ventricular tachycardia: Has heart failure with scar on his MRI.  We Deziree Mokry plan for ICD implant.  Risk and benefits were discussed with bleeding, tamponade, infection, pneumothorax, lead dislodgment.  He understands these risks and is agreed to the procedure. Systolic heart failure: Unclear as to the acuteness, though he does have scar which makes it seem chronic.  He [place] need scanning for amyloid as well as a PET scan for sarcoidosis. Atrial flutter: CHA2DS2-VASc of 2.  On Eliquis, but currently being held.  We Jentry Warnell restart at discharge.  Now on amiodarone for both VT and atrial flutter.  Shabree Tebbetts M. Joby Richart MD 12/10/2021 10:34 AM

## 2021-12-10 NOTE — Interval H&P Note (Signed)
History and Physical Interval Note:  12/10/2021 2:15 PM  John Keith  has presented today for surgery, with the diagnosis of vt.  The various methods of treatment have been discussed with the patient and family. After consideration of risks, benefits and other options for treatment, the patient has consented to  Procedure(s): ICD IMPLANT (N/A) as a surgical intervention.  The patient's history has been reviewed, patient examined, no change in status, stable for surgery.  I have reviewed the patient's chart and labs.  Questions were answered to the patient's satisfaction.     John Keith John Keith  ICD Criteria  Current LVEF:33%. Within 12 months prior to implant: Yes   Heart failure history: Yes, Class II  Cardiomyopathy history: Yes, Non-Ischemic Cardiomyopathy.  Atrial Fibrillation/Atrial Flutter: Yes, Paroxysmal.  Ventricular tachycardia history: Yes, Hemodynamic instability present. VT Type: Sustained Ventricular Tachycardia - Monomorphic.  Cardiac arrest history: No.  History of syndromes with risk of sudden death: No.  Previous ICD: No.  Current ICD indication: Secondary  PPM indication: No.  Class I or II Bradycardia indication present: No  Beta Blocker therapy for 3 or more months: Yes, prescribed.   Ace Inhibitor/ARB therapy for 3 or more months: Yes, prescribed.    I have seen John Keith is a 71 y.o. malepre-procedural and has been referred by Caryl Comes for consideration of ICD implant for secondary prevention of sudden death.  The patient's chart has been reviewed and they meet criteria for ICD implant.  I have had a thorough discussion with the patient reviewing options.  The patient and their family (if available) have had opportunities to ask questions and have them answered. The patient and I have decided together through the [place] [employer] to implant ICD at this time.  Risks, benefits, alternatives to ICD implantation were  discussed in detail with the patient today. The patient  understands that the risks include but are not limited to bleeding, infection, pneumothorax, perforation, tamponade, vascular damage, renal failure, MI, stroke, death, inappropriate shocks, and lead dislodgement and wishes to proceed.

## 2021-12-10 NOTE — Progress Notes (Signed)
Chart reviewed. Discussed case with EP. No role for general medicine at this time. TRH will sign off. Please re-consult if needed.  Jacquelin Hawking, MD [facility] 12/10/2021, 9:43 AM

## 2021-12-10 NOTE — TOC Benefit Eligibility Note (Signed)
Patient Teacher, English as a foreign language completed.    The patient is currently admitted and upon discharge could be taking Entresto 24-26 mg.  The current 30 day co-pay is, $45.00.   The patient is insured through [place], [place] Patient Advocate Specialist [place] Patient Advocate Team Direct Number: [phone]  Fax: [phone]

## 2021-12-10 NOTE — Progress Notes (Addendum)
Progress Note  Patient Name: John Keith Date of Encounter: 12/10/2021  [facility] Cardiologist: [name], MD   Subjective   No complaints, getting a bit of cabin fever, hopes to go home soon  Inpatient Medications    Scheduled Meds:  amiodarone  400 mg Oral BID   aspirin EC  81 mg Oral Daily   carvedilol  6.25 mg Oral BID WC   cholecalciferol  5,000 Units Oral Daily   ezetimibe  10 mg Oral Daily   ferrous sulfate  325 mg Oral Q breakfast   gabapentin  300 mg Oral TID   gentamicin (GARAMYCIN) 80 mg in sodium chloride 0.9 % 500 mL irrigation  80 mg Irrigation On Call   multivitamin with minerals  1 tablet Oral Daily   pantoprazole  40 mg Oral Daily   sacubitril-valsartan  1 tablet Oral BID   sodium chloride flush  3 mL Intravenous Q12H   Continuous Infusions:  sodium chloride     sodium chloride 50 mL/hr at 12/10/21 0631   sodium chloride      ceFAZolin (ANCEF) IV     PRN Meds: sodium chloride, acetaminophen, ondansetron (ZOFRAN) IV, ondansetron **OR** [DISCONTINUED] ondansetron (ZOFRAN) IV, sodium chloride flush, sodium chloride flush   Vital Signs    Vitals:   12/09/21 2332 12/10/21 0511 12/10/21 0538 12/10/21 0717  BP: 137/79 (!) 141/95  (!) 141/94  Pulse: (!) 57 (!) 55  68  Resp: 15 16  17   Temp: 98.1 F (36.7 C) 98 F (36.7 C)  97.9 F (36.6 C)  TempSrc: Oral Oral  Oral  SpO2: 98% 94%  93%  Weight:   111.9 kg     Intake/Output Summary (Last 24 hours) at 12/10/2021 0919 Last data filed at 12/10/2021 0814 Gross per 24 hour  Intake 1017 ml  Output 400 ml  Net 617 ml      12/10/2021    5:38 AM 12/09/2021    3:42 AM 12/03/2021   10:01 AM  Last 3 Weights  Weight (lbs) 246 lb 11.1 oz 255 lb 11.7 oz 253 lb 12.8 oz  Weight (kg) 111.9 kg 116 kg 115.123 kg      Telemetry    SB 50's generally, less PVCs, no VT- Personally Reviewed  ECG    No new EKGs - Personally Reviewed  Physical Exam   Unchanged exam GEN: No acute distress.   Neck:  No JVD Cardiac: RRR, no murmurs, rubs, or gallops.  Respiratory: CTA b/l. GI: Soft, nontender, non-distended  MS: No edema; No deformity. Neuro:  Nonfocal bilateral thenar weakness and wasting Psych: Normal affect   Labs    High Sensitivity Troponin:   Recent Labs  Lab 12/07/21 1556 12/07/21 1644  TROPONINIHS 39* 41*     Chemistry Recent Labs  Lab 12/07/21 1644 12/08/21 0318 12/08/21 0949 12/09/21 0454  NA 141 139  --  139  K 4.0 3.6  --  3.9  CL 107 110  --  108  CO2 26 25  --  23  GLUCOSE 89 102*  --  97  BUN 17 15  --  12  CREATININE 1.18 1.06 1.05 0.97  CALCIUM 9.5 8.7*  --  8.2*  MG 2.4  --   --   --   PROT 7.5  --   --   --   ALBUMIN 4.1  --   --   --   AST 38  --   --   --  ALT 28  --   --   --   ALKPHOS 92  --   --   --   BILITOT 0.6  --   --   --   GFRNONAA >60 >60 >60 >60  ANIONGAP 8 4*  --  8    Lipids No results for input(s): "CHOL", "TRIG", "HDL", "LABVLDL", "LDLCALC", "CHOLHDL" in the last 168 hours.  Hematology Recent Labs  Lab 12/08/21 0318 12/08/21 0949 12/09/21 0454  WBC 5.4 4.7 6.1  RBC 3.77* 3.92* 3.67*  HGB 11.4* 12.0* 11.5*  HCT 36.8* 39.0 35.1*  MCV 97.6 99.5 95.6  MCH 30.2 30.6 31.3  MCHC 31.0 30.8 32.8  RDW 12.7 12.7 12.7  PLT 267 270 229   Thyroid  Recent Labs  Lab 12/03/21 1106  TSH 1.160    BNPNo results for input(s): "BNP", "PROBNP" in the last 168 hours.  DDimer No results for input(s): "DDIMER" in the last 168 hours.   Radiology    DG Chest Portable 1 View  Result Date: 12/07/2021 CLINICAL DATA:  Chest pain. Syncope. Frequent runs of ventricular tachycardia on heart monitor. EXAM: PORTABLE CHEST 1 VIEW COMPARISON:  Multiple exams, including 05/16/2021 FINDINGS: The heart size and mediastinal contours are within normal limits. Both lungs are clear. The visualized skeletal structures are unremarkable. IMPRESSION: No active disease. Electronically Signed   By: Van Clines M.D.   On: 12/07/2021 17:07   Cardiac  Studies   12/08/21: c.MRI IMPRESSION: 1. Basal septal midwall late gadolinium enhancement, which is a scar pattern seen in nonischemic cardiomyopathies and associated with worse prognosis. There is also a focal area of LGE in basal inferior wall, as well as RV insertion site LGE (which is a nonspecific scar pattern often seen in setting of elevated pulmonary pressures). Given multifocal areas of LGE with high intensity in the basal septal LGE, and considering myocardial T2 values were elevated in lateral wall concerning for myocardial inflammation, raises concern for sarcoidosis. Recommend cardiac PET for further evaluation   2. Mild LV dilation, mild hypertrophy, and moderate systolic dysfunction (EF [id number])   3.  Normal RV size with mild systolic dysfunction (EF [id number])   12/08/21: LHC   Dist LAD lesion is 40% stenosed.   LV end diastolic pressure is normal.   There is no aortic valve stenosis.   Mild, nonobstructive CAD.     Standard catheters were just barely long enough to reach the coronaries and cross the aortic valve.  The patient is 6'9".     Continue w/u for nonsustained VT.   12/08/21: TTE  1. Left ventricular ejection fraction, by estimation, is 35 to 40%. The  left ventricle has moderately decreased function. The left ventricle  demonstrates global hypokinesis. The left ventricular internal cavity size  was mildly dilated. Left ventricular  diastolic parameters are consistent with Grade II diastolic dysfunction  (pseudonormalization).   2. Right ventricular systolic function is mildly reduced. The right  ventricular size is mildly enlarged. There is normal pulmonary artery  systolic pressure. The estimated right ventricular systolic pressure is  [id number] mmHg.   3. The mitral valve is grossly normal. Trivial mitral valve  regurgitation. No evidence of mitral stenosis.   4. The aortic valve is tricuspid. There is mild calcification of the  aortic valve. There is mild  thickening of the aortic valve. Aortic valve  regurgitation is trivial. Aortic valve sclerosis is present, with no  evidence of aortic valve stenosis.   5. The inferior vena cava  is dilated in size with >50% respiratory  variability, suggesting right atrial pressure of 8 mmHg.   13 day monitor: May 2023 Predominant underlying rhythm was sinus rhythm Less than 1% atrial fibrillation burden, true burden difficult due to presence of artifact during episodes Multiple episodes of wide-complex tachycardia, appears due to ventricular tachycardia Longest episode of ventricular tachycardia 1 minute 30 seconds with a heart rate of 208 bpm     06/11/2020: Coronary CT IMPRESSION: 1. Coronary calcium score of 23.1. This was 48th percentile for age and sex matched control. 2. Normal coronary origin with right dominance. 3. Mild calcifications in the proximal RCA, proximal and mid LAD causing minimal stenosis. 4. CAD-RADS 1. Minimal non-obstructive CAD (0-24%). Consider non-atherosclerotic causes of chest pain. Consider preventive therapy and risk factor modification.  Patient Profile     71 y.o. male HTN, HLDm smoker, Gastric ulcer w/bleed admitted with VT  Assessment & Plan    Recurrent syncope, near syncope Sustained and NS MMVT Myeloma panel is pending LVEF 35-40% Cath with NOD C.MRI abnormal (as above), Delrose Rohwer need PET scan out patient Dr. Caryl Comes Henry Demeritt discuss case with colleagues, HF team for thoughts/recommendations We [place] Lapaglia in the interim start amiodarone and plan for ICD  Dr. Curt Bears has seen the patient this AM, revisited rational for ICD, the procedure, potential risks/benefits He remains agreeable to proceed. In d/w Dr. Caryl Comes and his review of the case with HF MD, recommends as well getting an amyloid (PYP) scan today (ordered) They Moani Weipert be happy to follow him up outpatient and plan for outpt PET No other recommendations at this juncture  PO amiodarone started yesterday ARB >  ARNI yesterday Silveria Botz up-titrate BB post implant   3. AFlutter CHA2DS2Vasc is 2, on Eliquis, held on admission for cath DCCV 4/17 Had some PAF on his monitor, maintaining SR here Asheton Scheffler continue to hold off [place] until post implant pending pocket stability     For questions or updates, please contact [place] Please consult [email or web address] for contact info under        Signed, Baldwin Jamaica, PA-C  12/10/2021, 9:19 AM    I have seen and examined this patient with Tommye Standard.  Agree with above, note added to reflect my findings.  Currently feeling well.  No further ventricular arrhythmias.  GEN: Well nourished, well developed, in no acute distress  HEENT: normal  Neck: no JVD, carotid bruits, or masses Cardiac: RRR; no murmurs, rubs, or gallops,no edema  Respiratory:  clear to auscultation bilaterally, normal work of breathing GI: soft, nontender, nondistended, + BS MS: no deformity or atrophy  Skin: warm and dry Neuro:  Strength and sensation are intact Psych: euthymic mood, full affect   Ventricular tachycardia: Has heart failure with scar on his MRI.  We [name] plan for ICD implant.  Risk and benefits were discussed with bleeding, tamponade, infection, pneumothorax, lead dislodgment.  He understands these risks and is agreed to the procedure. Systolic heart failure: Unclear as to the acuteness, though he does have scar which makes it seem chronic.  He Mairlyn Tegtmeyer need scanning for amyloid as well as a PET scan for sarcoidosis. Atrial flutter: CHA2DS2-VASc of 2.  On Eliquis, but currently being held.  We Kiyah Demartini restart at discharge.  Now on amiodarone for both VT and atrial flutter.  Hiroki Wint M. Zuhayr Deeney MD 12/10/2021 10:34 AM

## 2021-12-10 NOTE — Plan of Care (Signed)

## 2021-12-10 NOTE — Care Management Important Message (Signed)
Important Message  Patient Details  Name: John Keith MRN: [id number] Date of Birth: [birth year]   Medicare Important Message Given:  Yes     [name] 12/10/2021, 4:28 PM

## 2021-12-11 ENCOUNTER — Inpatient Hospital Stay (HOSPITAL_COMMUNITY): Payer: PPO

## 2021-12-11 ENCOUNTER — Encounter (HOSPITAL_COMMUNITY): Payer: Self-pay | Admitting: Cardiology

## 2021-12-11 ENCOUNTER — Other Ambulatory Visit (HOSPITAL_COMMUNITY): Payer: Self-pay

## 2021-12-11 DIAGNOSIS — I472 Ventricular tachycardia, unspecified: Secondary | ICD-10-CM | POA: Diagnosis not present

## 2021-12-11 LAB — GLUCOSE, CAPILLARY: Glucose-Capillary: 103 mg/dL — ABNORMAL HIGH (ref 70–99)

## 2021-12-11 MED ORDER — AMIODARONE HCL 200 MG PO TABS: 200.0000 mg | ORAL_TABLET | Freq: Two times a day (BID) | ORAL | 0 refills | Status: DC

## 2021-12-11 MED ORDER — SACUBITRIL-VALSARTAN 24-26 MG PO TABS
1.0000 | ORAL_TABLET | Freq: Two times a day (BID) | ORAL | 5 refills | Status: DC
  Filled 2021-12-11: qty 60, 30d supply, fill #0

## 2021-12-11 MED ORDER — AMIODARONE HCL 200 MG PO TABS: 200.0000 mg | ORAL_TABLET | Freq: Every day | ORAL | 5 refills | Status: DC

## 2021-12-11 MED ORDER — AMIODARONE HCL 200 MG PO TABS
400.0000 mg | ORAL_TABLET | Freq: Two times a day (BID) | ORAL | 0 refills | Status: DC
  Filled 2021-12-11: qty 28, 7d supply, fill #0

## 2021-12-11 MED ORDER — CARVEDILOL 6.25 MG PO TABS
6.2500 mg | ORAL_TABLET | Freq: Two times a day (BID) | ORAL | 5 refills | Status: DC
  Filled 2021-12-11: qty 60, 30d supply, fill #0

## 2021-12-11 MED FILL — Gentamicin Sulfate Inj 40 MG/ML: INTRAMUSCULAR | Qty: 80 | Status: AC

## 2021-12-11 NOTE — Discharge Summary (Addendum)
ELECTROPHYSIOLOGY PROCEDURE DISCHARGE SUMMARY    Patient ID: John Keith,  MRN: [id number], DOB/AGE: 12-16-1950 71 y.o.  Admit date: 12/07/2021 Discharge date: 12/11/2021  Primary Care Physician: [name], Pasty Spillers, MD  Primary Cardiologist: Dr. Duke Salvia Electrophysiologist: new, Dr. Elberta Fortis  Primary Discharge Diagnosis:  Syncope VT Suspect sarcoidosis NICM  Secondary Discharge Diagnosis:  Atrial Flutter (typical) HTN   Allergies  Allergen Reactions   Crestor [Rosuvastatin Calcium] Other (See Comments)    Elevated muscle enzymes    Lipitor [Atorvastatin Calcium] Other (See Comments)    Elevated muscle enzymes    Tomato Itching     Procedures This Admission:  12/08/21: LHC 2.  Implantation of a MDT single chamber ICD on 12/10/21 by Dr Elberta Fortis, see procedure report for details.  DFT's were deferred at time of implant There were no immediate post procedure complications. CXR on 12/11/21 demonstrated no pneumothorax status post device implantation.   Brief HPI: John Keith is a 71 y.o. male was referred to the Afib clinic out patient for management of his AFlutter, he had undergone DCCV though despite SR reported ongoing symptoms of dizzy spells, and planned for monitoring.  He suffered a syncopal event while wearing the monitor, and once this was received and read note a number of NSMMVT episodes as well as a sustained episodes of MMVT and he was referred to the hospital, though perhaps his syncope, not exactly lined up with the sustained VT episode.    Hospital Course:  The patient was admitted for further evaluation and management. Labs largely unremarkable LHC with no obstructive CAD TTE noted reduced LVEF 35-40%, no significant VHD, grade II DD C.MRI was abnormal with concern of sarcoid, NICM Myeloma panel drawn In d/w heart failure MD, recommended amyloid scan and out patient HF team consult and f/u for PET Amyloid in review with Dr. Shirlee Latch, not c/w  amyloidosis He underwent implantation of an ICD with details as outlined in his procedure note. He was monitored on telemetry throughout his stay with no recurrent VT.  Left chest was without hematoma or ecchymosis.  The device was interrogated and found to be functioning normally.  CXR was obtained and demonstrated no pneumothorax status post device implantation.  Wound care, arm mobility, and restrictions were reviewed with the patient.  The patient feels well, denies any CP or SOB, minimal site discomfort, he was examined by Dr. Elberta Fortis and considered stable for discharge to home.   The patient's discharge medications include an ACE/ARB (Entresto) and beta blocker (carvedilol).  Amiodarone 400mg  BID x1 week > 400mg  daily for 1 week > 200mg  daily Resume Eliquis this evening Stop dilt, losartan Start coreg/Entresto  We discussed [place] law, no driving 6 months with the patient/his wife at bedside  HF TOC team is on board to help facilitate his follow up  Physical Exam: Vitals:   12/10/21 2004 12/10/21 2142 12/10/21 2353 12/11/21 0450  BP: (!) 149/93 138/82 120/74 119/85  Pulse: 60 62 (!) 59 (!) 54  Resp: 18  12 13   Temp: 98 F (36.7 C)  98 F (36.7 C) 97.8 F (36.6 C)  TempSrc: Oral  Oral Oral  SpO2: 97%  95% 93%  Weight:    111 kg    GEN- The patient is well appearing, alert and oriented x 3 today.   HEENT: normocephalic, atraumatic; sclera clear, conjunctiva pink; hearing intact; oropharynx clear Lungs- CTA b/l, normal work of breathing.  No wheezes, rales, rhonchi Heart- RRR, no murmurs,  rubs or gallops, PMI not laterally displaced GI- soft, non-tender, non-distended Extremities- no clubbing, cyanosis, or edema MS- no significant deformity or atrophy Skin- warm and dry, no rash or lesion, left chest without hematoma/ecchymosis Psych- euthymic mood, full affect Neuro- no gross defecits  Labs:   Lab Results  Component Value Date   WBC 6.1 12/09/2021   HGB 11.5 (L)  12/09/2021   HCT 35.1 (L) 12/09/2021   MCV 95.6 12/09/2021   PLT 229 12/09/2021    Recent Labs  Lab 12/07/21 1644 12/08/21 0318 12/09/21 0454  NA 141   < > 139  K 4.0   < > 3.9  CL 107   < > 108  CO2 26   < > 23  BUN 17   < > 12  CREATININE 1.18   < > 0.97  CALCIUM 9.5   < > 8.2*  PROT 7.5  --   --   BILITOT 0.6  --   --   ALKPHOS 92  --   --   ALT 28  --   --   AST 38  --   --   GLUCOSE 89   < > 97   < > = values in this interval not displayed.    Discharge Medications:  Allergies as of 12/11/2021       Reactions   Crestor [rosuvastatin Calcium] Other (See Comments)   Elevated muscle enzymes   Lipitor [atorvastatin Calcium] Other (See Comments)   Elevated muscle enzymes   Tomato Itching        Medication List     STOP taking these medications    diltiazem 120 MG 24 hr capsule Commonly known as: Cardizem CD   losartan-hydrochlorothiazide 100-12.5 MG tablet Commonly known as: HYZAAR       TAKE these medications    amiodarone 200 MG tablet Commonly known as: PACERONE Take 2 tablets (400 mg total) by mouth 2 (two) times daily for 7 days.   amiodarone 200 MG tablet Commonly known as: Pacerone Take 1 tablet (200 mg total) by mouth 2 (two) times daily for 7 days. Start taking on: December 19, 2021   amiodarone 200 MG tablet Commonly known as: Pacerone Take 1 tablet (200 mg total) by mouth daily. Start taking on: December 27, 2021   apixaban 5 MG Tabs tablet Commonly known as: Eliquis Take 1 tablet (5 mg total) by mouth 2 (two) times daily. Notes to patient: Resume 12/11/21 at your usual evening dose   carvedilol 6.25 MG tablet Commonly known as: COREG Take 1 tablet (6.25 mg total) by mouth 2 (two) times daily with a meal.   ezetimibe 10 MG tablet Commonly known as: Zetia Take 1 tablet (10 mg total) by mouth daily.   ferrous sulfate 325 (65 FE) MG tablet Take 325 mg by mouth daily with breakfast.   gabapentin 300 MG capsule Commonly known as:  NEURONTIN Take 3 capsules (900 mg total) by mouth at bedtime.   multivitamin tablet Take 1 tablet by mouth daily.   pantoprazole 40 MG tablet Commonly known as: Protonix Take 1 tablet (40 mg total) by mouth daily. 30 min before food   sacubitril-valsartan 24-26 MG Commonly known as: ENTRESTO Take 1 tablet by mouth 2 (two) times daily.   Vitamin D-3 125 MCG (5000 UT) Tabs Take 1 tablet by mouth daily.               Discharge Care Instructions  (From admission, onward)  Start     Ordered   12/11/21 0000  Discharge wound care:       Comments: As per the AVS   12/11/21 0902            Disposition: Home Discharge Instructions     Diet - low sodium heart healthy   Complete by: As directed    Discharge wound care:   Complete by: As directed    As per the AVS   Increase activity slowly   Complete by: As directed        Follow-up Information     [name], Pasty Spillers, MD. Go on 12/15/2021.   Specialty: Internal Medicine Why: @10 :40am Contact information: [address] [place] [address] [phone]         [facility] Office Follow up.   Specialty: Cardiology Why: 12/24/21 @ 9:20AM, wound check visit Contact information: [address] [place] [address] [phone]        Regan Lemming, MD Follow up.   Specialty: Cardiology Why: Dr. Gershon Crane scheduler Nasean Zapf call you to make a follow up visit with him. Contact information: [address] [place] [address] [phone]                 Duration of Discharge Encounter: Greater than 30 minutes including physician time.  Norma Fredrickson, PA-C 12/11/2021 9:17 AM  I have seen and examined this patient with Francis Dowse.  Agree with above, note added to reflect my findings.  Feeling well without complaint.  Status post Medtronic ICD implanted yesterday.  GEN: Well nourished, well developed, in no  acute distress  HEENT: normal  Neck: no JVD, carotid bruits, or masses Cardiac: RRR; no murmurs, rubs, or gallops,no edema  Respiratory:  clear to auscultation bilaterally, normal work of breathing GI: soft, nontender, nondistended, + BS MS: no deformity or atrophy  Skin: warm and dry, device site well healed Neuro:  Strength and sensation are intact Psych: euthymic mood, full affect   Ventricular tachycardia: Has a history of syncope.  Is status post Medtronic ICD for secondary prevention.  Device functioning appropriately.  Okay for discharge today. Systolic heart failure: Likely chronic.  No evidence of amyloid.  Cardiac MRI with scar concerning for sarcoid.  Khristian Seals need outpatient PET scan.  We Evertt Chouinard arrange for follow-up in heart failure clinic. Atrial flutter: We Maridee Slape resume anticoagulation tomorrow.  Now on amiodarone for ventricular arrhythmias as well as atrial flutter.  Weslee Fogg M. Kamryn Messineo MD 12/11/2021 9:23 AM

## 2021-12-11 NOTE — TOC Transition Note (Signed)
Transition of Care [facility]) - CM/SW Discharge Note   Patient Details  Name: John Keith MRN: [id number] Date of Birth: [birth year]  [facility] CM/SW Contact:  Leone Haven, RN Phone Number: 12/11/2021, 9:51 AM   Clinical Narrative:    Patient is for dc today, TOC to fill medications , some changes were made to medications.  Wife is at bedside and will transport him home.          Patient Goals and CMS Choice        Discharge Placement                       Discharge Plan and Services                                     Social Determinants of Health (SDOH) Interventions Food Insecurity Interventions: Intervention Not Indicated Housing Interventions: Intervention Not Indicated Transportation Interventions: Intervention Not Indicated   Readmission Risk Interventions     No data to display

## 2021-12-11 NOTE — TOC Benefit Eligibility Note (Signed)
Patient Advocate Encounter  Insurance verification completed.    The patient is currently admitted and upon discharge could be taking Farxiga 10 mg.  The current 30 day co-pay is, $45.00.   The patient is currently admitted and upon discharge could be taking Jardiance 10 mg.  The current 30 day co-pay is, $45.00.   The patient is insured through [employer] Part D    Erol Flanagin, CPhT Pharmacy Patient Advocate Specialist  Pharmacy Patient Advocate Team Direct Number: [phone]  Fax: [phone]        

## 2021-12-11 NOTE — Progress Notes (Signed)
Heart Failure Stewardship Pharmacist Progress Note   PCP: [name], [name], MD PCP-Cardiologist: [name], MD    HPI:  Mr. Bley was recently referred to the Afib clinic for management strategies for his AFutter, initially diagnosed 09/22/21 with symptoms of dizziness/palpitations He was started on eliquis, diltiazem and planned for DCCV once adequately anticoagulated. Also recommended sleep study with apparent witnessed apneic events but he declined. Underwent successful DCCV on 10/19/21   At this f/u with the AFib clinic, discussed some degree of chronic CP for years, but newer was ongoing intermittent dizziness/lightheadedness, despite SR.  Planned for a 2 week monitor.    He did have cardiology follow up 12/03/21 for recurrent dizziness and a syncopal event, a couple weeks prior and brief near syncope in the days leading up to his visit 12/03/21. His BP meds were adjusted and requested results from his monitor be expedited.     Monitor results became available 12/07/2021 with recurrent sustained episodes of VT and he was advised to go to the ER for admission.   12/08/2021 echo with EF 35-40%, global HK, RV mildly reduced. 12/08/2021 LHC 40% stenosis of distal LAD, LVEDP normal. (Mild, nonobstructive CAD).   12/08/2021 cMRI LVEF 33%, RVEF 40%, CO 4.3 (CI 1.7) Basal septal midwall late gadolinium enhancement, which is a scar pattern seen in nonischemic cardiomyopathies and associated with worse prognosis. There is also a focal area of LGE in basal inferior wall, as well as RV insertion site LGE (which is a nonspecific scar pattern often seen in setting of elevated pulmonary pressures). Given multifocal areas of LGE with high intensity in the basal septal LGE, and considering myocardial T2 values were elevated in lateral wall concerning for myocardial inflammation, raises concern for sarcoidosis. Recommended for cardiac PET for further evaluation of sarcoid as outpatient.   12/10/2021  underwent ICD placement for secondary prevention.   PYP scan for amyloid on 12/09/2021 with grade 1, H/CL equal 1.28 equivocally suggestive of transthyretin amyloidosis. 12/08/2021 multiple myeloma panel ordered, but still pending.   Current HF Medications: Beta Blocker: carvedilol 6.25 mg BID ACE/ARB/ARNI: Entresto 24/26 mg BID  Prior to admission HF Medications: None  Pertinent Lab Values: Serum creatinine 0.97, BUN 12, Potassium 3.9, Sodium 139, BNP none, Magnesium 2.4, A1c 5.6%  Multiple myeloma panel: in process  Vital Signs: Weight: 244 lbs (admission weight: 255 lbs) Blood pressure: 120-140/75-90  Heart rate: 60  I/O: not completely documented   Medication Assistance / Insurance Benefits Check: Does the patient have prescription insurance?  Yes Type of insurance plan: Medicare part D  Does the patient qualify for medication assistance through manufacturers or grants?   Pending will need follow-up at [facility] appointment Delene Loll continued as also on Eliquis  Outpatient Pharmacy:  Prior to admission outpatient pharmacy: [facility] Is the patient willing to use [place] at discharge? Yes Is the patient willing to transition their outpatient pharmacy to utilize a [facility] outpatient pharmacy?   Pending   Assessment: 1. Chronic systolic CHF (LVEF 53%), due to unclear etiology. NYHA class II symptoms. -Unclear amyloid vs. Sarcoid at this time. Further work-up ongoing. Myeloma panel pending. Agree with outpatient PET scan given equivocal PYP scan to further evaluate for sarcoid.  -Until amyloid ruled out would not titrate beta-blocker despite VT as they are typically poorly tolerated. Patient with ICD to provide secondary protection.  -Given VT keep K > 4, Mg >2.  -K 3.9, stable Scr, start spironolactone 12.5 mg daily.    Plan: 1)  Medication changes recommended at this time: -Start spironolactone 12.5 mg daily at follow-up.   2) Patient assistance: -Jardiance/Farxiga  copay $45 -Entresto copay $45  3)  Education  - Patient has been educated on current HF medications and potential additions to HF medication regimen - Patient verbalizes understanding that over the next few months, these medication doses may change and more medications may be added to optimize HF regimen - Patient has been educated on basic disease state pathophysiology and goals of therapy  Cathrine Muster, PharmD, [place] PGY2 Cardiology Pharmacy Resident

## 2021-12-11 NOTE — Discharge Instructions (Signed)

## 2021-12-11 NOTE — Care Management Important Message (Signed)
Important Message  Patient Details  Name: John Keith MRN: [id number] Date of Birth: 08/15/1950   Medicare Important Message Given:  Yes     Renie Ora 12/11/2021, 9:08 AM

## 2021-12-11 NOTE — Progress Notes (Signed)
Heart Failure Nurse Navigator Progress Note  PCP: [name], Pasty Spillers, MD PCP-Cardiologist: Duke Salvia Admission Diagnosis: Ventricular tachycardia, Syncope Admitted from: Home  Presentation:   John Keith presented with palpitations, at request of his cardiologist, currently wearing Zio monitor, showed frequent VT with longest lasting 1.5 seconds. Patient stated started about 3 weeks ago. Wit episodes of lightheadedness. BP 133/86, HR 68, Tropinin 41,  Per request of providers, Patient and wife were educated on the sign and symptoms of heart failure, daily weights, when to call his doctor or go to the ER,. Diet and fluid restrictions, taking all his medications as prescribed and attending all medical appointments. Both patient and wife were interactive and asked questions. Patient is scheduled for a HF TOC appointment on 12/15/21 @ 3 pm.    ECHO/ LVEF: 33 %  Clinical Course:  Past Medical History:  Diagnosis Date   Allergy    Arthritis    Atrial flutter (HCC)    Carpal tunnel syndrome on left    Chest pain of uncertain etiology 10/24/2019   COVID-19    07/07/19   DDD (degenerative disc disease), thoracolumbar    multilevel   Degenerative joint disease of left shoulder    02/2011    Diverticulosis    left colon (2008)    ED (erectile dysfunction)    02/2011    GERD (gastroesophageal reflux disease)    H. pylori infection    Hyperlipidemia    Hypertension    Insomnia    Lactose intolerance    Low back pain    PVC (premature ventricular contraction) 10/24/2019   Shingles    07/2018   Smoking    smoking since age 39 y.o   Toe fracture, left    4th in 2014   Vitamin D deficiency      Social History   Socioeconomic History   Marital status: Married    Spouse name: Proofreader   Number of children: 2   Years of education: Not on file   Highest education level: High school graduate  Occupational History   Occupation: Retired    Comment: Semi  Tobacco Use    Smoking status: Former    Packs/day: 0.50    Types: Cigarettes    Start date: 07/06/1971    Quit date: 06/26/2020    Years since quitting: 1.4   Smokeless tobacco: Never   Tobacco comments:    Former smoker 09/24/21  Vaping Use   Vaping Use: Never used  Substance and Sexual Activity   Alcohol use: No    Alcohol/week: 0.0 standard drinks of alcohol   Drug use: No   Sexual activity: Not on file  Other Topics Concern   Not on file  Social History Narrative   Married    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    On child    1 son, 1 daughter   Systems analyst op   Owns guns    Wears seat belt, safe in relationship    Smoker    Retired 06/27/2019   Social Determinants of Health   Financial Resource Strain: Low Risk  (08/11/2020)   Overall Financial Resource Strain (CARDIA)    Difficulty of Paying Living Expenses: Not hard at all  Food Insecurity: No Food Insecurity (12/11/2021)   Hunger Vital Sign    Worried About Running Out of Food in the Last Year: Never true    Ran Out of Food in the Last Year: Never true  Transportation Needs: No Transportation Needs (12/11/2021)  PRAPARE - Administrator, Civil Service (Medical): No    Lack of Transportation (Non-Medical): No  Physical Activity: Sufficiently Active (08/11/2020)   Exercise Vital Sign    Days of Exercise per Week: 5 days    Minutes of Exercise per Session: 30 min  Stress: No Stress Concern Present (08/11/2020)   [employer] of Occupational Health - Occupational Stress Questionnaire    Feeling of Stress : Not at all  Social Connections: Unknown (08/11/2020)   Social Connection and Isolation Panel [NHANES]    Frequency of Communication with Friends and Family: More than three times a week    Frequency of Social Gatherings with Friends and Family: More than three times a week    Attends Religious Services: Not on Marketing executive or Organizations: Yes    Attends Banker Meetings: Not on file    Marital Status: Married    Education Assessment and Provision:  Detailed education and instructions provided on heart failure disease management including the following:  Signs and symptoms of Heart Failure When to call the physician Importance of daily weights Low sodium diet Fluid restriction Medication management Anticipated future follow-up appointments  Patient education given on each of the above topics.  Patient acknowledges understanding via teach back method and acceptance of all instructions.  Education Materials:  "Living Better With Heart Failure" Booklet, HF zone tool, & Daily Weight Tracker Tool.  Patient has scale at home: Yes Patient has pill box at home: NA    High Risk Criteria for Readmission and/or Poor Patient Outcomes: Heart failure hospital admissions (last 6 months): 0  No Show rate: 4% Difficult social situation: No Demonstrates medication adherence: Yes Primary Language: English Literacy level: Reading, writing, and comprehension.   Barriers of Care:   New medications Daily weights Diet/ fluid restrictions  Considerations/Referrals:   Referral made to Heart Failure Pharmacist Stewardship: yes Referral made to Heart Failure CSW/NCM TOC: No Referral made to Heart & Vascular TOC clinic: yes 12/15/21 per [place]  Items for Follow-up on DC/TOC: Daily weights Diet/ fluid restrictions EF 33 %  Rhae Hammock, BSN, Scientist, clinical (histocompatibility and immunogenetics) Only

## 2021-12-11 NOTE — Plan of Care (Signed)

## 2021-12-11 NOTE — Progress Notes (Signed)
Mobility Specialist Progress Note:   12/11/21 1001  Mobility  Activity Ambulated with assistance in hallway  Level of Assistance Modified independent, requires aide device or extra time  Assistive Device None  Distance Ambulated (ft) 240 ft  Activity Response Tolerated well  $Mobility charge 1 Mobility   Pt received in bed willing to participate in mobility. No complaints of pain. Left EOB with call bell in reach and all needs met.   [facility] [place] Mobility Specialist

## 2021-12-12 NOTE — Progress Notes (Signed)
Heart and Vascular Center Transitions of [facility] Heart Failure Pharmacist Encounter  PCP: [name], [name], MD PCP-Cardiologist: [name], MD  HPI:  Mr. John Keith was recently referred to the Afib clinic for management strategies for his AFutter, initially diagnosed 09/22/21 with symptoms of dizziness/palpitations. He was started on eliquis, diltiazem and planned for DCCV once adequately anticoagulated. Also recommended sleep study with apparent witnessed apneic events but he declined. Underwent successful DCCV on 10/19/21   At this f/u with the AFib clinic, discussed some degree of chronic CP for years, but newer was ongoing intermittent dizziness/lightheadedness, despite SR.  Planned for a 2 week monitor.    He did have cardiology follow up 12/03/21 for recurrent dizziness and a syncopal event, a couple weeks prior and brief near syncope in the days leading up to his visit 12/03/21. His BP meds were adjusted and requested results from his monitor be expedited.     Monitor results became available 12/07/2021 with recurrent sustained episodes of VT and he was advised to go to the ER for admission.   12/08/2021 echo with EF 35-40%, global HK, RV mildly reduced. 12/08/2021 LHC 40% stenosis of distal LAD, LVEDP normal. (Mild, nonobstructive CAD).    12/08/2021 cMRI LVEF 33%, RVEF 40%, CO 4.3 (CI 1.7) Basal septal midwall late gadolinium enhancement, which is a scar pattern seen in nonischemic cardiomyopathies and associated with worse prognosis. There is also a focal area of LGE in basal inferior wall, as well as RV insertion site LGE (which is a nonspecific scar pattern often seen in setting of elevated pulmonary pressures). Given multifocal areas of LGE with high intensity in the basal septal LGE, and considering myocardial T2 values were elevated in lateral wall concerning for myocardial inflammation, raises concern for sarcoidosis. Recommended for cardiac PET for further evaluation of  sarcoid as outpatient.    12/10/2021 underwent ICD placement for secondary prevention.    PYP scan for amyloid on 12/09/2021 with grade 1, H/CL equal 1.28 equivocally suggestive of transthyretin amyloidosis. 12/08/2021 multiple myeloma panel results unremarkable. Work-up for sarcoid vs. Amyloid ongoing.   Today, John Keith presents to the [facility] for follow up. Denies SOB and dyspnea on exertion, orthopnea, PND, edema, dizziness, or ligheadedness. Patient reports no dizziness since placement of ICD. No LEE on exam. Weight at home as been stable at 241-242 lbs. Patient does not have a BP cuff at home, but they plan on getting one. He is adherent to salt and fluid restriction. He is taking all medications as prescribed except his amiodarone. He was confused on the directions and has only been taking 1 tablet twice a day instead of 2 tablets.    HF Medications: Beta Blocker: carvedilol 6.25 mg BID ACE/ARB/ARNI: Entresto 24/26 mg BID  Has the patient been experiencing any side effects to the medications prescribed?  no  Does the patient have any problems obtaining medications due to transportation or finances? Yes, due to cost and multiple banded medications, patient assistance completed Entresto and [place] today    Understanding of regimen: fair Understanding of indications: fair Potential of compliance: good Patient understands to avoid NSAIDs. Patient understands to avoid decongestants.   Pertinent Lab Values: Labs 12/11/2021: Serum creatinine 0.97, BUN 12, Potassium 3.9, Sodium 139, BNP none, Magnesium 2.4, A1c 5.6%  Labs 12/15/2021: Serum creatinine 1.33, BUN 20, Potassium 4.5, Sodium 139, BNP 64, A1c 5.4%   Vital Signs: Weight: 249 lbs (discharge weight: 244 lbs) Blood pressure: 128/74 mmHg  Heart rate:  65 bpm  Medication Assistance / Insurance Benefits Check: Does the patient have prescription insurance?  Yes Type of insurance plan: Medicare Part D  Does the  patient qualify for medication assistance through manufacturers or grants?   Yes Eligible grants and/or patient assistance programs: Delene Loll and [place] Medication assistance applications in progress: Entresto and [place]  Medication assistance applications approved: none Approved medication assistance renewals will be completed by: Dr. Haroldine Laws  Outpatient Pharmacy:  [facility] Was the [facility] pharmacy used to supply discharge medications? yes  If TOC pharmacy was used, were the refills transferred out to current pharmacy yet? New Rxs sent following TOC appointment to patients preferred pharmacy   Is the patient willing to transition their outpatient pharmacy to utilize a [facility] outpatient pharmacy with or without mail order?   No patient wants medications sent to [facility]  Assessment: 1) Chronic systolic CHF (LVEF 85%), due to unclear etiology. NYHA class II symptoms. -Volume status stable, euvolemic on exam. Does not require a diuretic.  -Suspect sarcoid cause of NICM. Agree with outpatient PET scan given equivocal PYP scan to further evaluate for sarcoid.  -Started on sarcoid protocol today, therefore no other medication changes.  -Consider SGLT2i or spironolactone at follow-up. Did not start with multiple medication changes.   2.) VT/Hx syncope: - S/p Medtronic ICD - Continue amiodarone tape per EP -Increase amiodarone to 400 mg BID   3. Atrial flutter: - On amiodarone as above - Continue Eliquis 5 BID.   4. CAD: - Mild on LHC 06/23 - No aspirin with anticoagulation.  - Continue Zetia. Has history of elevated LFTs with statins.   5. HTN: - BP at goal  - Continue current medications as above  Plan: 1) Medication changes: -Start sarcoid protocol, see separate chart note for medication titration of prednisone and methotrexate with dates -Start methotrexate as instructed -Start prednisone as instructed -Start Bactrim 1 DS tablet MWF -Start  calcium 1200 mg daily -Start folic acid 1 mg daily -Continue pantoprazole 40 mg daily -Continue 5000 units vitamin D3 daily  -Continue coreg 6.25 BID -Continue Entresto 24/26 mg BID  2) Patient Assistance: -Delene Loll and Farxiga patient assistance completed 12/15/2021 in clinic -Patient instructed to bring in tax forms  3) Follow up: - Next appointment with pharmacy clinic on 01/06/2022  Cathrine Muster, PharmD, [place] PGY2 Cardiology Pharmacy Resident

## 2021-12-14 ENCOUNTER — Telehealth: Payer: Self-pay

## 2021-12-14 LAB — MULTIPLE MYELOMA PANEL, SERUM
Albumin SerPl Elph-Mcnc: 3.6 g/dL (ref 2.9–4.4)
Albumin/Glob SerPl: 1.3 (ref 0.7–1.7)
Alpha 1: 0.1 g/dL (ref 0.0–0.4)
Alpha2 Glob SerPl Elph-Mcnc: 0.7 g/dL (ref 0.4–1.0)
B-Globulin SerPl Elph-Mcnc: 0.9 g/dL (ref 0.7–1.3)
Gamma Glob SerPl Elph-Mcnc: 1 g/dL (ref 0.4–1.8)
Globulin, Total: 2.8 g/dL (ref 2.2–3.9)
IgA: 298 mg/dL (ref 61–437)
IgG (Immunoglobin G), Serum: 1107 mg/dL (ref 603–1613)
IgM (Immunoglobulin M), Srm: 68 mg/dL (ref 15–143)
Total Protein ELP: 6.4 g/dL (ref 6.0–8.5)

## 2021-12-14 NOTE — Telephone Encounter (Signed)
Transition Care Management Unsuccessful Follow-up Telephone Call  Date of discharge and from where:  12/11/21 -[facility]  Attempts:  1st Attempt  Reason for unsuccessful TCM follow-up call:  Unable to leave message. HFU scheduled 12/15/21 at 10:40. Keep all scheduled appointments.

## 2021-12-14 NOTE — Progress Notes (Addendum)
HEART & VASCULAR TRANSITION OF CARE CONSULT NOTE     Referring Physician: Dr. Elberta Fortis Primary Care: [name], French Ana, MD Primary Cardiologist: Dr. [facility] Salvia  HPI: Referred to clinic by Dr. Elberta Fortis for heart failure consultation. 71 y.o. male with history of recently diagnosed atrial flutter s/p DCCV 10/19/21, HTN, hx tobacco use, hx GI bleed.   He established with Afib clinic in March for atrial flutter. He underwent successful DCCV 04/17. Despite restoration of SR he noted recurrent palpitations, lightheadedness and chest discomfort at f/u 05/15. 14 day Zio placed. Seen in Cardiology clinic 12/03/21 with palpitations, recurrent near syncope and a syncopal episode. BP meds reduced. Read on 14 day Zio was expedited and showed sustained episodes of VT (longest 1 minute 30 seconds).   Patient was referred to ED for admission d/t recurrent VT on monitor. EP was consulted. Echo: EF 35-40%, grade II DD, RV mildly reduced, RVSP 27 mmHg. Cardiac MRI: EF 33%, RVEF 40%, with multifocal areas of LGE with high intensity in basal septal LGE, elevated T2 values in lateral wall concerning for myocardial inflammation. Findings concerning for sarcoidosis. LHC demonstrated mild nonobstructive CAD. He was started on amiodarone and underwent ICD placement. Workup including PET for cardiac sarcoid recommended as outpatient.  Here today for hospital follow-up. Denies any recurrent dizziness or presyncope since discharge. No dyspnea, orthopnea, PND or lower extremity edema. His wife has been helping him with limiting sodium intake. Keeps fluids < 64 oz daily. Has been taking all meds as prescribed except taking amiodarone 200 mg BID instead of 400 mg BID.   Denies ETOH or alcohol use.   His mother and grandmother had heart issues, uncertain if they had CHF.  Review of Systems: [y] = yes,  = no   General: Weight gain ; Weight loss ; Anorexia ; Fatigue ; Fever ; Chills ; Weakness    Cardiac: Chest pain/pressure ; Resting SOB ; Exertional SOB ; Orthopnea ; Pedal Edema ; Palpitations [Y]; Syncope [Y]; Presyncope ; Paroxysmal nocturnal dyspnea[ ]   Pulmonary: Cough ; Wheezing[ ] ; Hemoptysis[ ] ; Sputum ; Snoring   GI: Vomiting[ ] ; Dysphagia[ ] ; Melena[ ] ; Hematochezia ; Heartburn[ ] ; Abdominal pain ; Constipation ; Diarrhea ; BRBPR   GU: Hematuria[ ] ; Dysuria ; Nocturia[ ]   Vascular: Pain in legs with walking ; Pain in feet with lying flat ; Non-healing sores ; Stroke ; TIA ; Slurred speech ;  Neuro: Headaches[ ] ; Vertigo[ ] ; Seizures[ ] ; Paresthesias[ ] ;Blurred vision ; Diplopia ; Vision changes   Ortho/Skin: Arthritis ; Joint pain ; Muscle pain ; Joint swelling ; Back Pain ; Rash   Psych: Depression[ ] ; Anxiety[ ]   Heme: Bleeding problems [Y]; Clotting disorders ; Anemia   Endocrine: Diabetes ; Thyroid dysfunction[ ]    Past Medical History:  Diagnosis Date   Allergy    Arthritis    Atrial flutter (HCC)    Carpal tunnel syndrome on left    Chest pain of uncertain etiology 10/24/2019   COVID-19    07/07/19   DDD (degenerative disc disease), thoracolumbar    multilevel   Degenerative joint disease of left shoulder    02/2011    Diverticulosis    left colon (2008)    ED (erectile dysfunction)    02/2011  GERD (gastroesophageal reflux disease)    H. pylori infection    Hyperlipidemia    Hypertension    Insomnia    Lactose intolerance    Low back pain    PVC (premature ventricular contraction) 10/24/2019   Shingles    07/2018   Smoking    smoking since age 86 y.o   Toe fracture, left    4th in 2014   Vitamin D deficiency     Current Outpatient Medications  Medication Sig Dispense Refill   [START ON 12/19/2021] amiodarone (PACERONE) 200 MG tablet Take 1 tablet (200 mg total) by mouth 2 (two) times daily for 7 days. 14 tablet 0   [START ON 12/27/2021] amiodarone  (PACERONE) 200 MG tablet Take 1 tablet (200 mg total) by mouth daily. 30 tablet 5   apixaban (ELIQUIS) 5 MG TABS tablet Take 1 tablet (5 mg total) by mouth 2 (two) times daily. 60 tablet 3   carvedilol (COREG) 6.25 MG tablet Take 1 tablet (6.25 mg total) by mouth 2 (two) times daily with a meal. 60 tablet 5   Cholecalciferol (VITAMIN D-3) 125 MCG (5000 UT) TABS Take 1 tablet by mouth daily.     ezetimibe (ZETIA) 10 MG tablet Take 1 tablet (10 mg total) by mouth daily. 90 tablet 3   ferrous sulfate 325 (65 FE) MG tablet Take 325 mg by mouth daily with breakfast.     gabapentin (NEURONTIN) 300 MG capsule Take 3 capsules (900 mg total) by mouth at bedtime. 180 capsule 3   Multiple Vitamin (MULTIVITAMIN) tablet Take 1 tablet by mouth daily.     pantoprazole (PROTONIX) 40 MG tablet Take 1 tablet (40 mg total) by mouth daily. 30 min before food 90 tablet 3   sacubitril-valsartan (ENTRESTO) 24-26 MG Take 1 tablet by mouth 2 (two) times daily. 60 tablet 5   amiodarone (PACERONE) 200 MG tablet Take 2 tablets (400 mg total) by mouth 2 (two) times daily for 7 days. (Patient not taking: Reported on 12/15/2021) 28 tablet 0   No current facility-administered medications for this encounter.    Allergies  Allergen Reactions   Crestor [Rosuvastatin Calcium] Other (See Comments)    Elevated muscle enzymes    Lipitor [Atorvastatin Calcium] Other (See Comments)    Elevated muscle enzymes    Tomato Itching      Social History   Socioeconomic History   Marital status: Married    Spouse name: Gwendolyn   Number of children: 2   Years of education: Not on file   Highest education level: High school graduate  Occupational History   Occupation: Retired    Comment: Semi  Tobacco Use   Smoking status: Former    Packs/day: 0.50    Types: Cigarettes    Start date: 07/06/1971    Quit date: 06/26/2020    Years since quitting: 1.4   Smokeless tobacco: Never   Tobacco comments:    Former smoker 09/24/21   Vaping Use   Vaping Use: Never used  Substance and Sexual Activity   Alcohol use: No    Alcohol/week: 0.0 standard drinks of alcohol   Drug use: No   Sexual activity: Not on file  Other Topics Concern   Not on file  Social History Narrative   Married    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    On child    1 son, 1 daughter   Systems analyst op   Owns guns    Wears seat belt, safe in relationship  Smoker    Retired 06/27/2019   Social Determinants of Health   Financial Resource Strain: Low Risk  (08/11/2020)   Overall Financial Resource Strain (CARDIA)    Difficulty of Paying Living Expenses: Not hard at all  Food Insecurity: No Food Insecurity (12/11/2021)   Hunger Vital Sign    Worried About Running Out of Food in the Last Year: Never true    Ran Out of Food in the Last Year: Never true  Transportation Needs: No Transportation Needs (12/11/2021)   PRAPARE - Administrator, Civil Service (Medical): No    Lack of Transportation (Non-Medical): No  Physical Activity: Sufficiently Active (08/11/2020)   Exercise Vital Sign    Days of Exercise per Week: 5 days    Minutes of Exercise per Session: 30 min  Stress: No Stress Concern Present (08/11/2020)   [employer] of Occupational Health - Occupational Stress Questionnaire    Feeling of Stress : Not at all  Social Connections: Unknown (08/11/2020)   Social Connection and Isolation Panel [NHANES]    Frequency of Communication with Friends and Family: More than three times a week    Frequency of Social Gatherings with Friends and Family: More than three times a week    Attends Religious Services: Not on file    Active Member of Clubs or Organizations: Yes    Attends Banker Meetings: Not on file    Marital Status: Married  Intimate Partner Violence: Not At Risk (08/11/2020)   Humiliation, Afraid, Rape, and Kick questionnaire    Fear of Current or Ex-Partner: No    Emotionally Abused: No    Physically Abused: No    Sexually Abused: No       Family History  Problem Relation Age of Onset   Heart disease Mother        age 15    Throat cancer Father 4   Cancer Father        throat    Cancer Maternal Uncle        uncle ?maternal or paternal died colon cancer in his 30s    Diabetes Maternal Aunt    Heart attack Maternal Grandmother     Vitals:   12/15/21 1456  BP: 128/74  Pulse: 65  SpO2: 95%  Weight: 112.9 kg (249 lb)    PHYSICAL EXAM: General:  No distress. Ambulated into clinic. HEENT: normal Neck: supple. no JVD. Carotids 2+ bilat; no bruits.  Cor: PMI nondisplaced. Regular rate & rhythm. No rubs, gallops or murmurs. ICD incision site healing well, no hematoma. Lungs: clear Abdomen: soft, nontender, nondistended.  Extremities: no cyanosis, clubbing, rash, edema Neuro: alert & oriented x 3, cranial nerves grossly intact. moves all 4 extremities w/o difficulty. Affect pleasant.  ECG: SR 61 bpm ST-T wave abnormality inferolateral leads (Noted on prior ECGs)   ASSESSMENT & PLAN: Chronic systolic CHF/NICM: -Echo 06/23: EF 35-40%, grade II DD, RV mildly reduced, RVSP 27 mmHg. -Cardiac MRI 06/23: EF 33%, RVEF 40%, with multifocal areas of LGE with high intensity in basal septal LGE, elevated T2 values in lateral wall concerning for myocardial inflammation. Findings concerning for sarcoidosis.  -LHC 06/23: mild nonobstructive CAD. -Seen with Dr. Gala Romney. Suspect etiology cardiac sarcoidosis (most likely) vs hereditary LMNA cardiomyopathy -Arrange cardiac PET at [facility] -NYHA II. Volume appears stable. Not requiring loop diuretic. -Continue coreg 6.25 BID -Continue Entresto 24/26 mg BID -Consider SGLT2i or spiro next. Did not start today, initiating sarcoid protocol (see below). -Labs today  -  [facility] Therapy for Cardiac Sarcoid / Advanced Heart Failure at [facility]   Prednisone  Weeks 1-4: 30 mg daily  Weeks 5-8 : 25 mg daily  Weeks 9-12: 20 mg daily  Weeks 13-16: 15 mg  daily Weeks 17-18: 10 mg daily; d/c Bactrim Weeks 19-20: 7.5 mg daily  Weeks 21-22: 5 mg daily Weeks 23-24: 2.5 mg daily  Weeks 25: stop prednisone   Methotrexate Weeks 1&2: 10 mg (four 2.5 mg tablets) once weekly Weeks 3&4: 12.5 mg (five 2.5 mg tablets) once weekly Weeks 5&6: 15 mg (six 2.5 mg tablets) once weekly  Weeks 7&8: 17.5 mg (seven 2.5 mg tablets) once weekly Weeks 9+:  20 mg (eight 2.5 mg tablets ) once weekly   Additional Medications  Bactrim DS : 1 tablet Mon-Wed- Fri while taking >15 mg of prednisone daily   Folic Acid : 1 mg daily   Calcium: 1200-1500 mg daily   Vitamin D: [phone] IU total daily   Omeprazole:  daily while taking any dose of prednisone.   Monitoring   CBC with diff, AST, ALT, creatinine, and fasting blood glucose  Month 1  Month 2  Month 3  Every 3 months thereafter   2. VT/Hx syncope: - Sustained VT on recent monitor - S/p Medtronic ICD - Continue amiodarone taper. Recommended he increase amiodarone to 400 mg BID until 06/17.  - Followed by EP - No driving X 6 months  3. Atrial flutter: - SR today - On amiodarone as above - Continue Eliquis 5 BID. Denies bleeding issues.  4. CAD: - Mild on LHC 06/23 - No aspirin with anticoagulation.  - Continue Zetia. Has history of elevated LFTs with statins.  5. HTN: - BP at goal  - Meds as above  NYHA II GDMT  Diuretic-None needed BB-Coreg 6.25 BID Ace/ARB/ARNI-Entresto 24/26 BID MRA-Not yet SGLT2i-Not yet  Very overwhelmed recent diagnoses and complexity of new medication regimen. Would benefit from short term enrollment in Paramedicine program to assist him. He was gifted a pill box in the clinic today.    Referred to HFSW (PCP, Medications, Transportation, ETOH Abuse, Drug Abuse, Insurance, Financial ): Yes, SDOH, paramedicine referral Refer to Pharmacy: Yes, sarcoid protocol Refer to Home Health: No Refer to Advanced Heart Failure Clinic: Yes Refer to General  Cardiology: No  Follow up: 3-4 weeks PharmD, 6 weeks Dr. Gala Romney, has f/u with primary cardiology team 06/30  Anna Genre PA-C  Patient seen and examined with the above-signed Advanced Practice Provider and/or Housestaff. I personally reviewed laboratory data, imaging studies and relevant notes. I independently examined the patient and formulated the important aspects of the plan. I have edited the note to reflect any of my changes or salient points. I have personally discussed the plan with the patient and/or family.  71 y/o as above with probable cardiac sarcoidosis based on clinical course and cMRI. Admitted with recent syncope/VT. Now s/p ICD. NYHA II. Volume status ok.   Chest CT no evidence of pulmonary sarcoid  Mother and MGM with hear disease but couldn/t give me specifics.   General:  Well appearing. No resp difficulty HEENT: normal Neck: supple. no JVD. Carotids 2+ bilat; no bruits. No lymphadenopathy or thryomegaly appreciated. Cor: PMI nondisplaced. Regular rate & rhythm. No rubs, gallops or murmurs. Lungs: clear Abdomen: soft, nontender, nondistended. No hepatosplenomegaly. No bruits or masses. Good bowel sounds. Extremities: no cyanosis, clubbing, rash, edema Neuro: alert & orientedx3, cranial nerves grossly intact. moves all 4 extremities w/o difficulty.  Affect pleasant  Long discussion about diagnosis of probable cardiac sarcoidosis. Ideally would get PET scan to confirm but given delay to get PET and recent VT will proceed with immunosuppressive protocol. D/w PharmD.   Continue to titrate GDMT as tolerated.   Arvilla Meres, MD  5:11 PM

## 2021-12-15 ENCOUNTER — Telehealth (HOSPITAL_COMMUNITY): Payer: Self-pay

## 2021-12-15 ENCOUNTER — Encounter: Payer: Self-pay | Admitting: Internal Medicine

## 2021-12-15 ENCOUNTER — Telehealth (HOSPITAL_COMMUNITY): Payer: Self-pay | Admitting: *Deleted

## 2021-12-15 ENCOUNTER — Encounter (HOSPITAL_COMMUNITY): Payer: Self-pay

## 2021-12-15 ENCOUNTER — Ambulatory Visit (HOSPITAL_COMMUNITY)
Admit: 2021-12-15 | Discharge: 2021-12-15 | Disposition: A | Discharge status: Home / Self Care | Payer: PPO | Source: Ambulatory Visit | Attending: Physician Assistant | Admitting: Physician Assistant

## 2021-12-15 ENCOUNTER — Ambulatory Visit (INDEPENDENT_AMBULATORY_CARE_PROVIDER_SITE_OTHER): Payer: PPO | Admitting: Internal Medicine

## 2021-12-15 VITALS — BP 128/74 | HR 65 | Wt 249.0 lb

## 2021-12-15 VITALS — BP 126/80 | HR 67 | Temp 98.0°F | Resp 14 | Ht >= 80 in | Wt 248.2 lb

## 2021-12-15 DIAGNOSIS — Z7901 Long term (current) use of anticoagulants: Secondary | ICD-10-CM | POA: Diagnosis not present

## 2021-12-15 DIAGNOSIS — I428 Other cardiomyopathies: Secondary | ICD-10-CM | POA: Diagnosis not present

## 2021-12-15 DIAGNOSIS — Z8616 Personal history of COVID-19: Secondary | ICD-10-CM | POA: Diagnosis not present

## 2021-12-15 DIAGNOSIS — J3489 Other specified disorders of nose and nasal sinuses: Secondary | ICD-10-CM | POA: Diagnosis not present

## 2021-12-15 DIAGNOSIS — I4892 Unspecified atrial flutter: Secondary | ICD-10-CM | POA: Diagnosis not present

## 2021-12-15 DIAGNOSIS — I5042 Chronic combined systolic (congestive) and diastolic (congestive) heart failure: Secondary | ICD-10-CM

## 2021-12-15 DIAGNOSIS — Z79899 Other long term (current) drug therapy: Secondary | ICD-10-CM | POA: Diagnosis not present

## 2021-12-15 DIAGNOSIS — K259 Gastric ulcer, unspecified as acute or chronic, without hemorrhage or perforation: Secondary | ICD-10-CM

## 2021-12-15 DIAGNOSIS — I472 Ventricular tachycardia, unspecified: Secondary | ICD-10-CM | POA: Diagnosis not present

## 2021-12-15 DIAGNOSIS — I5022 Chronic systolic (congestive) heart failure: Secondary | ICD-10-CM | POA: Diagnosis not present

## 2021-12-15 DIAGNOSIS — I10 Essential (primary) hypertension: Secondary | ICD-10-CM

## 2021-12-15 DIAGNOSIS — I110 Hypertensive heart disease with heart failure: Secondary | ICD-10-CM | POA: Insufficient documentation

## 2021-12-15 DIAGNOSIS — E785 Hyperlipidemia, unspecified: Secondary | ICD-10-CM

## 2021-12-15 DIAGNOSIS — R109 Unspecified abdominal pain: Secondary | ICD-10-CM

## 2021-12-15 DIAGNOSIS — Z95810 Presence of automatic (implantable) cardiac defibrillator: Secondary | ICD-10-CM | POA: Insufficient documentation

## 2021-12-15 DIAGNOSIS — I2510 Atherosclerotic heart disease of native coronary artery without angina pectoris: Secondary | ICD-10-CM | POA: Diagnosis not present

## 2021-12-15 DIAGNOSIS — Z87891 Personal history of nicotine dependence: Secondary | ICD-10-CM | POA: Diagnosis not present

## 2021-12-15 LAB — BASIC METABOLIC PANEL
Anion gap: 8 (ref 5–15)
BUN: 20 mg/dL (ref 8–23)
CO2: 26 mmol/L (ref 22–32)
Calcium: 9.3 mg/dL (ref 8.9–10.3)
Chloride: 105 mmol/L (ref 98–111)
Creatinine, Ser: 1.33 mg/dL — ABNORMAL HIGH (ref 0.61–1.24)
GFR, Estimated: 57 mL/min — ABNORMAL LOW (ref >60)
Glucose, Bld: 92 mg/dL (ref 70–99)
Potassium: 4.5 mmol/L (ref 3.5–5.1)
Sodium: 139 mmol/L (ref 135–145)

## 2021-12-15 LAB — HEMOGLOBIN A1C
Hgb A1c MFr Bld: 5.4 % (ref 4.8–5.6)
Mean Plasma Glucose: 108.28 mg/dL

## 2021-12-15 LAB — BRAIN NATRIURETIC PEPTIDE: B Natriuretic Peptide: 64 pg/mL (ref 0.0–100.0)

## 2021-12-15 MED ORDER — EZETIMIBE 10 MG PO TABS: 10.0000 mg | ORAL_TABLET | Freq: Every day | ORAL | 3 refills | Status: DC

## 2021-12-15 MED ORDER — PANTOPRAZOLE SODIUM 40 MG PO TBEC: 40.0000 mg | DELAYED_RELEASE_TABLET | Freq: Every day | ORAL | 3 refills | Status: DC

## 2021-12-15 MED ORDER — SULFAMETHOXAZOLE-TRIMETHOPRIM 800-160 MG PO TABS: 1.0000 | ORAL_TABLET | ORAL | 3 refills | Status: DC

## 2021-12-15 MED ORDER — FOLIC ACID 1 MG PO TABS: 1.0000 mg | ORAL_TABLET | Freq: Every day | ORAL | 11 refills | Status: DC

## 2021-12-15 MED ORDER — PREDNISONE 10 MG PO TABS: ORAL_TABLET | ORAL | 0 refills | Status: DC

## 2021-12-15 MED ORDER — METHOTREXATE 2.5 MG PO TABS: ORAL_TABLET | ORAL | 0 refills | Status: DC

## 2021-12-15 MED ORDER — SACUBITRIL-VALSARTAN 24-26 MG PO TABS: 1.0000 | ORAL_TABLET | Freq: Two times a day (BID) | ORAL | 5 refills | Status: DC

## 2021-12-15 MED ORDER — CALCIUM CARBONATE 1250 (500 CA) MG PO TABS: 1.0000 | ORAL_TABLET | Freq: Every day | ORAL | 11 refills | Status: AC

## 2021-12-15 MED ORDER — APIXABAN 5 MG PO TABS: 5.0000 mg | ORAL_TABLET | Freq: Two times a day (BID) | ORAL | 3 refills | Status: DC

## 2021-12-15 MED ORDER — VITAMIN D-3 125 MCG (5000 UT) PO TABS: 1.0000 | ORAL_TABLET | Freq: Every day | ORAL | 11 refills | Status: AC

## 2021-12-15 MED ORDER — DAPAGLIFLOZIN PROPANEDIOL 10 MG PO TABS: 10.0000 mg | ORAL_TABLET | Freq: Every day | ORAL | 3 refills | Status: DC

## 2021-12-15 MED ORDER — CARVEDILOL 6.25 MG PO TABS: 6.2500 mg | ORAL_TABLET | Freq: Two times a day (BID) | ORAL | 5 refills | Status: DC

## 2021-12-15 NOTE — Patient Instructions (Addendum)
Dr. Redmond Baseman ENT nose sore  [address], [place], [place] [address] Phone: [phone]  Heart-Healthy Eating Plan Many factors influence your heart (coronary) health, including eating and exercise habits. Coronary risk increases with abnormal blood fat (lipid) levels. Heart-healthy meal planning includes limiting unhealthy fats, increasing healthy fats, and making other diet and lifestyle changes. What is my plan? Your health care provider may recommend that you: Limit your fat intake to _________% or less of your total calories each day. Limit your saturated fat intake to _________% or less of your total calories each day. Limit the amount of cholesterol in your diet to less than _________ mg per day. What are tips for following this plan? Cooking Cook foods using methods other than frying. Baking, boiling, grilling, and broiling are all good options. Other ways to reduce fat include: Removing the skin from poultry. Removing all visible fats from meats. Steaming vegetables in water or broth. Meal planning  At meals, imagine dividing your plate into fourths: Fill one-half of your plate with vegetables and green salads. Fill one-fourth of your plate with whole grains. Fill one-fourth of your plate with lean protein foods. Eat 4-5 servings of vegetables per day. One serving equals 1 cup raw or cooked vegetable, or 2 cups raw leafy greens. Eat 4-5 servings of fruit per day. One serving equals 1 medium whole fruit,  cup dried fruit,  cup fresh, frozen, or canned fruit, or  cup 100% fruit juice. Eat more foods that contain soluble fiber. Examples include apples, broccoli, carrots, beans, peas, and barley. Aim to get 25-30 g of fiber per day. Increase your consumption of legumes, nuts, and seeds to 4-5 servings per week. One serving of dried beans or legumes equals  cup cooked, 1 serving of nuts is  cup, and 1 serving of seeds equals 1 tablespoon. Fats Choose healthy fats more often.  Choose monounsaturated and polyunsaturated fats, such as olive and canola oils, flaxseeds, walnuts, almonds, and seeds. Eat more omega-3 fats. Choose salmon, mackerel, sardines, tuna, flaxseed oil, and ground flaxseeds. Aim to eat fish at least 2 times each week. Check food labels carefully to identify foods with trans fats or high amounts of saturated fat. Limit saturated fats. These are found in animal products, such as meats, butter, and cream. Plant sources of saturated fats include palm oil, palm kernel oil, and coconut oil. Avoid foods with partially hydrogenated oils in them. These contain trans fats. Examples are stick margarine, some tub margarines, cookies, crackers, and other baked goods. Avoid fried foods. General information Eat more home-cooked food and less restaurant, buffet, and fast food. Limit or avoid alcohol. Limit foods that are high in starch and sugar. Lose weight if you are overweight. Losing just 5-10% of your body weight can help your overall health and prevent diseases such as diabetes and heart disease. Monitor your salt (sodium) intake, especially if you have high blood pressure. Talk with your health care provider about your sodium intake. Try to incorporate more vegetarian meals weekly. What foods can I eat? Fruits All fresh, canned (in natural juice), or frozen fruits. Vegetables Fresh or frozen vegetables (raw, steamed, roasted, or grilled). Green salads. Grains Most grains. Choose whole wheat and whole grains most of the time. Rice and pasta, including brown rice and pastas made with whole wheat. Meats and other proteins Lean, well-trimmed beef, veal, pork, and lamb. Chicken and [place] without skin. All fish and shellfish. Wild duck, rabbit, pheasant, and venison. Egg whites or  low-cholesterol egg substitutes. Dried beans, peas, lentils, and tofu. Seeds and most nuts. Dairy Low-fat or nonfat cheeses, including ricotta and mozzarella. Skim or 1% milk (liquid,  powdered, or evaporated). Buttermilk made with low-fat milk. Nonfat or low-fat yogurt. Fats and oils Non-hydrogenated (trans-free) margarines. Vegetable oils, including soybean, sesame, sunflower, olive, peanut, safflower, corn, canola, and cottonseed. Salad dressings or mayonnaise made with a vegetable oil. Beverages Water (mineral or sparkling). Coffee and tea. Diet carbonated beverages. Sweets and desserts Sherbet, gelatin, and fruit ice. Small amounts of dark chocolate. Limit all sweets and desserts. Seasonings and condiments All seasonings and condiments. The items listed above may not be a complete list of foods and beverages you can eat. Contact a dietitian for more options. What foods are not recommended? Fruits Canned fruit in heavy syrup. Fruit in cream or butter sauce. Fried fruit. Limit coconut. Vegetables Vegetables cooked in cheese, cream, or butter sauce. Fried vegetables. Grains Breads made with saturated or trans fats, oils, or whole milk. Croissants. Sweet rolls. Donuts. High-fat crackers, such as cheese crackers. Meats and other proteins Fatty meats, such as hot dogs, ribs, sausage, bacon, rib-eye roast or steak. High-fat deli meats, such as salami and bologna. Caviar. Domestic duck and goose. Organ meats, such as liver. Dairy Cream, sour cream, cream cheese, and creamed cottage cheese. Whole-milk cheeses. Whole or 2% milk (liquid, evaporated, or condensed). Whole buttermilk. Cream sauce or high-fat cheese sauce. Whole-milk yogurt. Fats and oils Meat fat, or shortening. Cocoa butter, hydrogenated oils, palm oil, coconut oil, palm kernel oil. Solid fats and shortenings, including bacon fat, salt pork, lard, and butter. Nondairy cream substitutes. Salad dressings with cheese or sour cream. Beverages Regular sodas and any drinks with added sugar. Sweets and desserts Frosting. Pudding. Cookies. Cakes. Pies. Milk chocolate or white chocolate. Buttered syrups. Full-fat ice  cream or ice cream drinks. The items listed above may not be a complete list of foods and beverages to avoid. Contact a dietitian for more information. Summary Heart-healthy meal planning includes limiting unhealthy fats, increasing healthy fats, and making other diet and lifestyle changes. Lose weight if you are overweight. Losing just 5-10% of your body weight can help your overall health and prevent diseases such as diabetes and heart disease. Focus on eating a balance of foods, including fruits and vegetables, low-fat or nonfat dairy, lean protein, nuts and legumes, whole grains, and heart-healthy oils and fats. This information is not intended to replace advice given to you by your health care provider. Make sure you discuss any questions you have with your health care provider. Document Revised: 10/30/2020 Document Reviewed: 10/30/2020 Elsevier Patient Education  2022 [employer] With Heart Failure Heart failure is a long-term (chronic) condition in which the heart cannot pump enough blood through the body. When this happens, parts of the body do not get the blood and oxygen they need. There is no cure for heart failure at this time, so it is important for you to take good care of yourself and follow the treatment plan you set with your health care provider. If you are living with heart failure, there are ways to help you manage the disease. How to manage lifestyle changes Living with heart failure requires you to make changes in your life. Your health care team will teach you about the changes you need to make in order to relieve your symptoms and lower your risk of going to the hospital. Work with your health care provider to develop a treatment plan  that works for you. Activity Ask your health care provider about attending cardiac rehabilitation. These programs include aerobic physical activity, which provides many benefits for your heart. If no cardiac rehabilitation program is  available, ask your health care provider what aerobic exercises are safe for you to do. Return to your normal activities as told by your health care provider. Ask your health care provider what activities are safe for you. Pace your daily activities and allow time for rest as needed. Managing stress It is normal to have many emotions about your diagnosis, such as fear, sadness, anger, and loss. If you feel any of these emotions and need help coping, contact your health care provider. Here are some ways to help yourself manage these emotions: Talk to friends and family members about your condition. They can give you support and guidance. Explain your symptoms to them and, if comfortable, invite them to attend appointments or rehabilitation with you. Join a support group for people with chronic heart failure. Talking with other people who have the same symptoms may give you new ways of coping with your disease and your emotions. Accept help from others. Do not be ashamed if you need help with certain tasks. Use stress management techniques, such as meditation, breathing exercises, or listening to relaxing music. Conditions such as depression and anxiety are common in persons with heart failure. Pay attention to changes in your mood, emotions, and stress levels. Tell your health care provider if you have any of the following symptoms: Trouble sleeping or a change in your sleeping patterns. Feeling sad, down, or depressed more often than not, every day for more than 2 weeks. Losing interest in activities you normally enjoy. Feeling irritable or crying for no reason. Finding yourself worrying about the future often. Work You may need to develop a plan with your health care provider if heart failure interferes with your ability to work. This may include: Reducing your work hours. Finding functions that are less active or require less effort. Planning rest periods during your work hours. Travel Talk  with your health care provider if you plan to travel. There may be circumstances in which your health care provider recommends that you do not travel or that you delay travel until your condition is under control. When you travel, bring your medicine and a list of your medicines. If you are traveling by air, keep your medicines with you in a carry-on bag. Consider finding a medical facility in the area you will be traveling to and determine what your health insurance will cover. If you will be traveling by public transportation (airplane, train, bus), contact the company prior to traveling if you have special needs. This may include needs related to diet, oxygen, a wheelchair, a seating request, or help with luggage. If you use oxygen, make sure to bring enough oxygen with you. If you have a battery-powered device, bring a fully charged extra battery with you. If you have a device, bring a note from your health care provider and inform all security screening personnel that you have the device. You may need to go through special screening for safety. Sexual activity  Ask your health care provider when it is safe for you to resume sexual activity. You may need to start slowly and gradually increase intimacy. You can increase intimacy by doing such things as caressing, touching, and holding each other. Get regular exercise as told by your health care provider. This can benefit your sex life by building strength  and endurance. Sleep If your condition interferes with your sleep, find ways to improve your sleep quality, such as: Sleep lying on your side, or sleep with your head elevated by raising the head of your bed or using multiple pillows. Ask your health care provider about screening for sleep apnea. Try to go to sleep and wake up at the same times every day. Sleep in a dark, cool room. Do not do any physical activity or eating for a few hours before bedtime. Plan rest periods during the day, but  do not take long naps during the day.  Where to find support Consider talking with: Family members. Close friends. A mental health professional or therapist. A member of your church, faith, or community group. Other sources of support include: Local support groups. Ask your health care provider about groups near you. Online support groups, such as those found through the American Heart Association: supportnetwork.heart.org Local home care agencies, community agencies, or social agencies. A palliative care specialist. Palliative care can help you manage symptoms, promote comfort, improve quality of life, and maintain dignity. Where to find more information American Heart Association: heart.org National Heart, Lung, and Blood Institute: https://www.hartman-hill.biz/ Centers for Disease Control and Prevention: https://www.reeves.com/ [place]: SolutionApps.it Contact a health care provider if: You have a rapid weight gain. You have increasing shortness of breath that is unusual for you. You are unable to participate in your usual physical activities. You tire easily. You have difficulty sleeping, such as: You wake up feeling short of breath. You have to use more pillows to raise your head in order to sleep. You cough more than normal, especially with physical activity. You have any swelling or more swelling in areas such as your hands, feet, ankles, or abdomen. You become dizzy or light-headed when you stand up. You have changes to your appetite. You have symptoms of depression or anxiety. Get help right away if: You have difficulty breathing. You notice or your family notices a change in your awareness, such as having trouble staying awake or having difficulty with concentration. You have pain or discomfort in your chest. You have an episode of fainting (syncope). You feel like your heart is beating quickly (palpitations). You have extreme feelings of sadness or loss of hope,  or you have thoughts about hurting yourself or others. These symptoms may represent a serious problem that is an emergency. Do not wait to see if the symptoms will go away. Get medical help right away. Call your local emergency services (911 in the [place] Do not drive yourself to the hospital. Summary There is no cure for heart failure, so it is important for you to take good care of yourself and follow the treatment plan set by your health care provider. Ask your health care provider about attending cardiac rehabilitation. These programs include aerobic physical activity, which provides many benefits for your heart. It is normal to have many emotions about your diagnosis, such as fear, sadness, anger, and loss. If you feel any of these emotions and need help coping, contact your health care provider. You may need to develop a plan with your health care provider if heart failure interferes with your ability to work. This information is not intended to replace advice given to you by your health care provider. Make sure you discuss any questions you have with your health care provider. Document Revised: 02/04/2020 Document Reviewed: 02/04/2020 Elsevier Patient Education  [place] Heart  failure, also called congestive heart failure, occurs when your heart does not pump blood well enough to meet your body's needs for oxygen-rich blood. Heart failure is a long-term (chronic) condition. Living with heart failure can be challenging. Following your health care provider's instructions about a healthy lifestyle and working with a dietitian to choose the right foods may help to improve your symptoms. An eating plan for someone with heart failure will include changes that limit the intake of salt (sodium) and unhealthy fat. What are tips for following this plan? Reading food labels Check food labels for the amount of sodium per serving. Choose foods that have less than 140 mg  (milligrams) of sodium in each serving. Check food labels for the number of calories per serving. This is important if you need to limit your daily calorie intake to lose weight. Check food labels for the serving size. If you eat more than one serving, you will be eating more sodium and calories than what is listed on the label. Look for foods that are labeled as "sodium-free," "very low sodium," or "low sodium." Foods labeled as "reduced sodium" or "lightly salted" may still have more sodium than what is recommended for you. Cooking Avoid adding salt when cooking. Ask your health care provider or dietitian before using salt substitutes. Season food with salt-free seasonings, spices, or herbs. Check the label of seasoning mixes to make sure they do not contain salt. Cook with heart-healthy oils, such as olive, canola, soybean, or sunflower oil. Do not fry foods. Cook foods using low-fat methods, such as baking, boiling, grilling, and broiling. Limit unhealthy fats when cooking by: Removing the skin from poultry, such as chicken. Removing all visible fats from meats. Skimming the fat off from stews, soups, and gravies before serving them. Meal planning  Limit your intake of: Processed, canned, or prepackaged foods. Foods that are high in trans fat, such as fried foods. Sweets, desserts, sugary drinks, and other foods with added sugar. Full-fat dairy products, such as whole milk. Eat a balanced diet. This may include: 4-5 servings of fruit each day and 4-5 servings of vegetables each day. At each meal, try to fill one-half of your plate with fruits and vegetables. Up to 6-8 servings of whole grains each day. Up to 2 servings of lean meat, poultry, or fish each day. One serving of meat is equal to 3 oz (85 g). This is about the same size as a deck of cards. 2 servings of low-fat dairy each day. Heart-healthy fats. Healthy fats called omega-3 fatty acids are found in foods such as flaxseed and  cold-water fish like sardines, salmon, and mackerel. Aim to eat 25-35 g (grams) of fiber a day. Foods that are high in fiber include apples, broccoli, carrots, beans, peas, and whole grains. Do not add salt or condiments that contain salt (such as soy sauce) to foods before eating. When eating at a restaurant, ask that your food be prepared with less salt or no salt, if possible. Try to eat 2 or more vegetarian meals each week. Eat more home-cooked food and eat less restaurant, buffet, and fast food. General information Do not eat more than 2,300 mg of sodium a day. The amount of sodium that is recommended for you may be lower, depending on your condition. Maintain a healthy body weight as directed. Ask your health care provider what a healthy weight is for you. Check your weight every day. Work with your health care provider and dietitian to make  a plan that is right for you to lose weight or maintain your current weight. Limit how much fluid you drink. Ask your health care provider or dietitian how much fluid you can have each day. Limit or avoid alcohol as told by your health care provider or dietitian. Recommended foods Fruits All fresh, frozen, and canned fruits. Dried fruits, such as raisins, prunes, and cranberries. Vegetables All fresh vegetables. Vegetables that are frozen without sauce or added salt. Low-sodium or sodium-free canned vegetables. Grains Bread with less than 80 mg of sodium per slice. Whole-wheat pasta, quinoa, and brown rice. Oats and oatmeal. Barley. [place]. Grits and cream of wheat. Whole-grain and whole-wheat cold cereal. Meats and other protein foods Lean cuts of meat. Skinless chicken and [place]. Fish with high omega-3 fatty acids, such as salmon, sardines, and other cold-water fishes. Eggs. Dried beans, peas, and edamame. Unsalted nuts and nut butters. Dairy Low-fat or nonfat (skim) milk and dried milk. Rice milk, soy milk, and almond milk. Low-fat or nonfat  yogurt. Small amounts of reduced-sodium block cheese. Low-sodium cottage cheese. Fats and oils Olive, canola, soybean, flaxseed, avocado, or sunflower oil. Sweets and desserts Applesauce. Granola bars. Sugar-free pudding and gelatin. Frozen fruit bars. Seasoning and other foods Fresh and dried herbs. Lemon or lime juice. Vinegar. Low-sodium ketchup. Salt-free marinades, salad dressings, sauces, and seasonings. The items listed above may not be a complete list of foods and beverages you can eat. Contact a dietitian for more information. Foods to avoid Fruits Fruits that are dried with sodium-containing preservatives. Vegetables Canned vegetables. Frozen vegetables with sauce or seasonings. Creamed vegetables. [place] fries. Onion rings. Pickled vegetables and sauerkraut. Grains Bread with more than 80 mg of sodium per slice. Hot or cold cereal with more than 140 mg sodium per serving. Salted pretzels and crackers. Prepackaged breadcrumbs. Bagels, croissants, and biscuits. Meats and other protein foods Ribs and chicken wings. Bacon, ham, pepperoni, bologna, salami, and packaged luncheon meats. Hot dogs, bratwurst, and sausage. Canned meat. Smoked meat and fish. Salted nuts and seeds. Dairy Whole milk, half-and-half, and cream. Buttermilk. Processed cheese, cheese spreads, and cheese curds. Regular cottage cheese. Feta cheese. Shredded cheese. String cheese. Fats and oils Butter, lard, shortening, ghee, and bacon fat. Canned and packaged gravies. Seasoning and other foods Onion salt, garlic salt, table salt, and sea salt. Marinades. Regular salad dressings. Relishes, pickles, and olives. Meat flavorings and tenderizers, and bouillon cubes. Horseradish, ketchup, and mustard. Worcestershire sauce. Teriyaki sauce, soy sauce (including reduced sodium). Hot sauce and Tabasco sauce. Steak sauce, fish sauce, oyster sauce, and cocktail sauce. Taco seasonings. Barbecue sauce. Tartar sauce. The items listed  above may not be a complete list of foods and beverages you should avoid. Contact a dietitian for more information. Summary A heart failure eating plan includes changes that limit your intake of sodium and unhealthy fat, and it may help you lose weight or maintain a healthy weight. Your health care provider may also recommend limiting how much fluid you drink. Most people with heart failure should eat no more than 2,300 mg of salt (sodium) a day. The amount of sodium that is recommended for you may be lower, depending on your condition. Contact your health care provider or dietitian before making any major changes to your diet. This information is not intended to replace advice given to you by your health care provider. Make sure you discuss any questions you have with your health care provider. Document Revised: 02/04/2020 Document Reviewed: 02/04/2020 Elsevier Patient Education  2023 [employer]  

## 2021-12-15 NOTE — Patient Instructions (Signed)
Medication Changes:  AMIODARONE: 2 TABS AM, 2 TABS PM JUNE 17: AMIODARONE: 1 TAB AM, 1 TAB PM JUNE 25: AMIODARONE: 1 TAB DAILY.   START: prednisone 6/14 as directed (see chart)  START: methotrexate 6/14 as directed (see chart)  START folic acid- one tablet daily.   START Calcium: one tablet daily.   START Bactrim: one tablet Monday, Wednesday, Friday  Lab Work:  Labs done today, we will contact you for abnormal readings.  Testing/Procedures:  Your provider has recommended you have a Cardiac PET Scan at [facility]. We will get this approved with your insurance company and get it scheduled for you. We will call you with the date and time and instructions. [facility] will call you to review this information the day before the test.    Special Instructions // Education:  Do the following things EVERYDAY: Weigh yourself in the morning before breakfast. Write it down and keep it in a log. Take your medicines as prescribed Eat low salt foods--Limit salt (sodium) to 2000 mg per day.  Stay as active as you can everyday Limit all fluids for the day to less than 2 liters   How to Prepare for Your Cardiac PET/CT Stress Test:  1. Please do not take these medications before your test:   Medications that may interfere with the cardiac pharmacological stress agent (ex. nitrates - including erectile dysfunction medications or beta-blockers) the day of the exam. (Erectile dysfunction medication should be held for at least 72 hrs prior to test) Theophylline containing medications for 12 hours. Dipyridamole 48 hours prior to the test. Your remaining medications may be taken with water.  2. Nothing to eat or drink, except water, 3 hours prior to arrival time.   NO caffeine/decaffeinated products, or chocolate 12 hours prior to arrival.  3. NO perfume, cologne or lotion  4. Total time is 1 to 2 hours; you may want to bring reading material for the waiting time.  5. Please report to Admitting at the  [facility] Main Entrance 60 minutes early for your test.  [address] [place], [place] [address]  Diabetic Preparation:  Hold oral medications. You may take NPH and Lantus insulin. Do not take Humalog or Humulin R (Regular Insulin) the day of your test. Check blood sugars prior to leaving the house. If able to eat breakfast prior to 3 hour fasting, you may take all medications, including your insulin, Do not worry if you miss your breakfast dose of insulin - start at your next meal.  IF YOU THINK YOU MAY BE PREGNANT, OR ARE NURSING PLEASE INFORM THE TECHNOLOGIST.  In preparation for your appointment, medication and supplies will be purchased.  Appointment availability is limited, so if you need to cancel or reschedule, please call the Radiology Department at [phone]  24 hours in advance to avoid a cancellation fee of $100.00  What to Expect After you Arrive:  Once you arrive and check in for your appointment, you will be taken to a preparation room within the Radiology Department.  A technologist or Nurse will obtain your medical history, verify that you are correctly prepped for the exam, and explain the procedure.  Afterwards,  an IV will be started in your arm and electrodes will be placed on your skin for EKG monitoring during the stress portion of the exam. Then you will be escorted to the PET/CT scanner.  There, staff will get you positioned on the scanner and obtain a blood pressure and EKG.  During the exam, you will continue to be connected to the EKG and blood pressure machines.  A small, safe amount of a radioactive tracer will be injected in your IV to obtain a series of pictures of your heart along with an injection of a stress agent.    After your Exam:  It is recommended that you eat a meal and drink a caffeinated beverage to counter act any effects of the stress agent.  Drink plenty of fluids for the remainder of the day and urinate frequently for the first  couple of hours after the exam.  Your doctor will inform you of your test results within 7-10 business days.  For questions about your test or how to prepare for your test, please call: Rockwell Alexandria, Cardiac Imaging Nurse Navigator  Larey Brick, Cardiac Imaging Nurse Navigator Office: [phone]    Follow-Up in: as scheduled.   At the [facility], you and your health needs are our priority. We have a designated team specialized in the treatment of Heart Failure. This Care Team includes your primary Heart Failure Specialized Cardiologist (physician), Advanced Practice Providers (APPs- Physician Assistants and Nurse Practitioners), and Pharmacist who all work together to provide you with the care you need, when you need it.   You may see any of the following providers on your designated Care Team at your next follow up:  Dr Arvilla Meres Dr Carron Curie, NP Robbie Lis, [place] [facility], [place] Karle Plumber, PharmD   Please be sure to bring in all your medications bottles to every appointment.   Need to Contact us:  If you have any questions or concerns before your next appointment please send [place] a message through [facility] or call our office at [phone].    TO LEAVE A MESSAGE FOR THE NURSE SELECT OPTION 2, PLEASE LEAVE A MESSAGE INCLUDING: YOUR NAME DATE OF BIRTH CALL BACK NUMBER REASON FOR CALL**this is important as we prioritize the call backs  YOU WILL RECEIVE A CALL BACK THE SAME DAY AS LONG AS YOU CALL BEFORE 4:00 PM

## 2021-12-15 NOTE — Progress Notes (Signed)
Chief Complaint  Patient presents with   Hospitalization Follow-up    Was admitted from 6/5-6/9 for V-Tach episodes    F/u with wife Gwen  1. Vtach with syncope, Atrial flutter in [facility] 12/07/21 to 12/11/21 and chronic combined CHF with EF 35-40% on amio, entresto, coreg, eliquis appt with cards today to f/u  1 of these medications he states make him have no appetite  2. Left upper inner nose nasal sore not going away     Review of Systems  Constitutional:  Negative for weight loss.  HENT:  Negative for hearing loss.   Eyes:  Negative for blurred vision.  Respiratory:  Negative for shortness of breath.   Cardiovascular:  Negative for chest pain.  Gastrointestinal:  Negative for abdominal pain and blood in stool.  Musculoskeletal:  Negative for back pain.  Skin:  Negative for rash.  Neurological:  Negative for headaches.  Psychiatric/Behavioral:  Negative for depression.    Past Medical History:  Diagnosis Date   Allergy    Arthritis    Atrial flutter ([place])    Carpal tunnel syndrome on left    Chest pain of uncertain etiology 58/03/9832   COVID-19    07/07/19   DDD (degenerative disc disease), thoracolumbar    multilevel   Degenerative joint disease of left shoulder    02/2011    Diverticulosis    left colon (2008)    ED (erectile dysfunction)    02/2011    GERD (gastroesophageal reflux disease)    H. pylori infection    Hyperlipidemia    Hypertension    Insomnia    Lactose intolerance    Low back pain    PVC (premature ventricular contraction) 10/24/2019   Shingles    07/2018   Smoking    smoking since age 50 y.o   Toe fracture, left    4th in 2014   Vitamin D deficiency    Past Surgical History:  Procedure Laterality Date   APPENDECTOMY     BACK SURGERY     x 2 10991 and 1995 in [place]   CARDIOVERSION N/A 10/19/2021   Procedure: CARDIOVERSION;  Surgeon: Werner Lean, MD;  Location: [facility];  Service:  Cardiovascular;  Laterality: N/A;   CARPAL TUNNEL RELEASE Left    COLONOSCOPY     2008 diverticulosis   ESOPHAGOGASTRODUODENOSCOPY     h/o +h. pylori   ICD IMPLANT N/A 12/10/2021   Procedure: ICD IMPLANT;  Surgeon: [name], MD;  Location: [facility];  Service: Cardiovascular;  Laterality: N/A;   LEFT HEART CATH AND CORONARY ANGIOGRAPHY N/A 12/08/2021   Procedure: LEFT HEART CATH AND CORONARY ANGIOGRAPHY;  Surgeon: Jettie Booze, MD;  Location: [facility];  Service: Cardiovascular;  Laterality: N/A;   OTHER SURGICAL HISTORY     heart catherization 1999 normal    SPINE SURGERY     x 2    Family History  Problem Relation Age of Onset   Heart disease Mother        age 58    Throat cancer Father 62   Cancer Father        throat    Cancer Maternal Uncle        uncle ?maternal or paternal died colon cancer in his 60s    Diabetes Maternal Aunt    Heart attack Maternal Grandmother    Social History   Socioeconomic History  Marital status: Married    Spouse name: Gwendolyn   Number of children: 2   Years of education: Not on file   Highest education level: High school graduate  Occupational History   Occupation: Retired    Comment: Semi  Tobacco Use   Smoking status: Former    Packs/day: 0.50    Types: Cigarettes    Start date: 07/06/1971    Quit date: 06/26/2020    Years since quitting: 1.4   Smokeless tobacco: Never   Tobacco comments:    Former smoker 09/24/21  Vaping Use   Vaping Use: Never used  Substance and Sexual Activity   Alcohol use: No    Alcohol/week: 0.0 standard drinks of alcohol   Drug use: No   Sexual activity: Not on file  Other Topics Concern   Not on file  Social History Narrative   Married    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    On child    1 son, 1 daughter   Social worker op   Owns guns    Wears seat belt, safe in relationship    Smoker    Retired 06/27/2019   Social Determinants of Health   Financial Resource Strain: [place]   (08/11/2020)   Overall Financial Resource Strain (CARDIA)    Difficulty of Paying Living Expenses: Not hard at all  Food Insecurity: No [place] (12/11/2021)   Hunger Vital Sign    Worried About Running Out of Food in the Last Year: Never true    [place] in the Last Year: Never true  Transportation Needs: No Transportation Needs (12/11/2021)   PRAPARE - Hydrologist (Medical): No    Lack of Transportation (Non-Medical): No  Physical Activity: Sufficiently Active (08/11/2020)   Exercise Vital Sign    Days of Exercise per Week: 5 days    Minutes of Exercise per Session: 30 min  Stress: No Stress Concern Present (08/11/2020)   [place]    Feeling of Stress : Not at all  Social Connections: Unknown (08/11/2020)   Social Connection and Isolation Panel [NHANES]    Frequency of Communication with Friends and Family: More than three times a week    Frequency of Social Gatherings with Friends and Family: More than three times a week    Attends Religious Services: Not on file    Active Member of Clubs or Organizations: Yes    Attends Archivist Meetings: Not on file    Marital Status: Married  Intimate Partner Violence: Not At Risk (08/11/2020)   Humiliation, Afraid, Rape, and Kick questionnaire    Fear of Current or Ex-Partner: No    Emotionally Abused: No    Physically Abused: No    Sexually Abused: No   Current Meds  Medication Sig   amiodarone (PACERONE) 200 MG tablet Take 2 tablets (400 mg total) by mouth 2 (two) times daily for 7 days.   [START ON 12/19/2021] amiodarone (PACERONE) 200 MG tablet Take 1 tablet (200 mg total) by mouth 2 (two) times daily for 7 days.   [START ON 12/27/2021] amiodarone (PACERONE) 200 MG tablet Take 1 tablet (200 mg total) by mouth daily.   apixaban (ELIQUIS) 5 MG TABS tablet Take 1 tablet (5 mg total) by mouth 2 (two) times daily.   carvedilol  (COREG) 6.25 MG tablet Take 1 tablet (6.25 mg total) by mouth 2 (two) times daily with a meal.  Cholecalciferol (VITAMIN D-3) 125 MCG (5000 UT) TABS Take 1 tablet by mouth daily.   ezetimibe (ZETIA) 10 MG tablet Take 1 tablet (10 mg total) by mouth daily.   ferrous sulfate 325 (65 FE) MG tablet Take 325 mg by mouth daily with breakfast.   gabapentin (NEURONTIN) 300 MG capsule Take 3 capsules (900 mg total) by mouth at bedtime.   Multiple Vitamin (MULTIVITAMIN) tablet Take 1 tablet by mouth daily.   pantoprazole (PROTONIX) 40 MG tablet Take 1 tablet (40 mg total) by mouth daily. 30 min before food   sacubitril-valsartan (ENTRESTO) 24-26 MG Take 1 tablet by mouth 2 (two) times daily.   Allergies  Allergen Reactions   Crestor [Rosuvastatin Calcium] Other (See Comments)    Elevated muscle enzymes    Lipitor [Atorvastatin Calcium] Other (See Comments)    Elevated muscle enzymes    Tomato Itching   Recent Results (from the past 2160 hour(s))  Troponin I (High Sensitivity)     Status: Abnormal   Collection Time: 09/24/21  9:40 AM  Result Value Ref Range   High Sens Troponin I 37 (HH) 2 - 17 ng/L  CBC     Status: Abnormal   Collection Time: 10/01/21  8:40 AM  Result Value Ref Range   WBC 4.1 4.0 - 10.5 K/uL   RBC 3.93 (L) 4.22 - 5.81 MIL/uL   Hemoglobin 12.3 (L) 13.0 - 17.0 g/dL   HCT 38.4 (L) 39.0 - 52.0 %   MCV 97.7 80.0 - 100.0 fL   MCH 31.3 26.0 - 34.0 pg   MCHC 32.0 30.0 - 36.0 g/dL   RDW 12.9 11.5 - 15.5 %   Platelets 266 150 - 400 K/uL   nRBC 0.0 0.0 - 0.2 %    Comment: Performed at [facility], [address] [place], [place] [address]  Basic metabolic panel     Status: Abnormal   Collection Time: 10/01/21  8:40 AM  Result Value Ref Range   Sodium 142 135 - 145 mmol/L   Potassium 3.5 3.5 - 5.1 mmol/L   Chloride 108 98 - 111 mmol/L   CO2 24 22 - 32 mmol/L   Glucose, Bld 130 (H) 70 - 99 mg/dL    Comment: Glucose reference range applies only to samples taken after  fasting for at least 8 hours.   BUN 15 8 - 23 mg/dL   Creatinine, Ser 1.30 (H) 0.61 - 1.24 mg/dL   Calcium 9.1 8.9 - 10.3 mg/dL   GFR, Estimated 59 (L) >60 mL/min    Comment: (NOTE) Calculated using the CKD-EPI Creatinine Equation (2021)    Anion gap 10 5 - 15    Comment: Performed at [facility] [address], [place], [place] [address]  Basic metabolic panel     Status: None   Collection Time: 12/03/21 11:06 AM  Result Value Ref Range   Glucose 87 70 - 99 mg/dL   BUN 9 8 - 27 mg/dL   Creatinine, Ser 0.91 0.76 - 1.27 mg/dL   eGFR 90 >59 mL/min/1.73   BUN/Creatinine Ratio 10 10 - 24   Sodium 137 134 - 144 mmol/L   Potassium 4.1 3.5 - 5.2 mmol/L   Chloride 99 96 - 106 mmol/L   CO2 22 20 - 29 mmol/L   Calcium 9.4 8.6 - 10.2 mg/dL  CBC     Status: None   Collection Time: 12/03/21 11:06 AM  Result Value Ref Range   WBC 5.8 3.4 -  10.8 x10E3/uL   RBC 4.29 4.14 - 5.80 x10E6/uL   Hemoglobin 13.6 13.0 - 17.7 g/dL   Hematocrit 38.7 37.5 - 51.0 %   MCV 90 79 - 97 fL   MCH 31.7 26.6 - 33.0 pg   MCHC 35.1 31.5 - 35.7 g/dL   RDW 11.6 11.6 - 15.4 %   Platelets 314 150 - 450 x10E3/uL  TSH     Status: None   Collection Time: 12/03/21 11:06 AM  Result Value Ref Range   TSH 1.160 0.450 - 4.500 uIU/mL  Troponin I (High Sensitivity)     Status: Abnormal   Collection Time: 12/07/21  3:56 PM  Result Value Ref Range   Troponin I (High Sensitivity) 39 (H) <18 ng/L    Comment: (NOTE) Elevated high sensitivity troponin I (hsTnI) values and significant  changes across serial measurements may suggest ACS but many other  chronic and acute conditions are known to elevate hsTnI results.  Refer to the "Links" section for chest pain algorithms and additional  guidance. Performed at [facility], [place] [address], [place], [place] [address]   Basic metabolic panel     Status: None   Collection Time: 12/07/21  4:44 PM  Result Value Ref Range   Sodium 141 135 - 145 mmol/L   Potassium  4.0 3.5 - 5.1 mmol/L   Chloride 107 98 - 111 mmol/L   CO2 26 22 - 32 mmol/L   Glucose, Bld 89 70 - 99 mg/dL    Comment: Glucose reference range applies only to samples taken after fasting for at least 8 hours.   BUN 17 8 - 23 mg/dL   Creatinine, Ser 1.18 0.61 - 1.24 mg/dL   Calcium 9.5 8.9 - 10.3 mg/dL   GFR, Estimated >60 >60 mL/min    Comment: (NOTE) Calculated using the CKD-EPI Creatinine Equation (2021)    Anion gap 8 5 - 15    Comment: Performed at [facility] [address] [place], [place] [address]  CBC     Status: Abnormal   Collection Time: 12/07/21  4:44 PM  Result Value Ref Range   WBC 7.8 4.0 - 10.5 K/uL   RBC 4.20 (L) 4.22 - 5.81 MIL/uL   Hemoglobin 13.1 13.0 - 17.0 g/dL   HCT 42.1 39.0 - 52.0 %   MCV 100.2 (H) 80.0 - 100.0 fL   MCH 31.2 26.0 - 34.0 pg   MCHC 31.1 30.0 - 36.0 g/dL   RDW 12.7 11.5 - 15.5 %   Platelets 271 150 - 400 K/uL   nRBC 0.0 0.0 - 0.2 %    Comment: Performed at [facility], [place] [address] [place], [place] [address]  Troponin I (High Sensitivity)     Status: Abnormal   Collection Time: 12/07/21  4:44 PM  Result Value Ref Range   Troponin I (High Sensitivity) 41 (H) <18 ng/L    Comment: (NOTE) Elevated high sensitivity troponin I (hsTnI) values and significant  changes across serial measurements may suggest ACS but many other  chronic and acute conditions are known to elevate hsTnI results.  Refer to the "Links" section for chest pain algorithms and additional  guidance. Performed at [facility], [place] [address] [place],  [address]   Hepatic function panel     Status: None   Collection Time: 12/07/21  4:44 PM  Result Value Ref Range   Total Protein 7.5 6.5 - 8.1 g/dL   Albumin 4.1  3.5 - 5.0 g/dL   AST 38 15 - 41 U/L   ALT 28 0 - 44 U/L   Alkaline Phosphatase 92 38 - 126 U/L   Total Bilirubin 0.6 0.3 - 1.2 mg/dL   Bilirubin, Direct 0.1 0.0 - 0.2 mg/dL   Indirect Bilirubin 0.5 0.3 - 0.9 mg/dL     Comment: Performed at [facility] [address] [place], [place] [address]  Magnesium     Status: None   Collection Time: 12/07/21  4:44 PM  Result Value Ref Range   Magnesium 2.4 1.7 - 2.4 mg/dL    Comment: Performed at [facility] [address] [place], [place] [address]  Basic metabolic panel     Status: Abnormal   Collection Time: 12/08/21  3:18 AM  Result Value Ref Range   Sodium 139 135 - 145 mmol/L   Potassium 3.6 3.5 - 5.1 mmol/L   Chloride 110 98 - 111 mmol/L   CO2 25 22 - 32 mmol/L   Glucose, Bld 102 (H) 70 - 99 mg/dL    Comment: Glucose reference range applies only to samples taken after fasting for at least 8 hours.   BUN 15 8 - 23 mg/dL   Creatinine, Ser 1.06 0.61 - 1.24 mg/dL   Calcium 8.7 (L) 8.9 - 10.3 mg/dL   GFR, Estimated >60 >60 mL/min    Comment: (NOTE) Calculated using the CKD-EPI Creatinine Equation (2021)    Anion gap 4 (L) 5 - 15    Comment: Performed at [facility] [address] [place], [place] [address]  CBC     Status: Abnormal   Collection Time: 12/08/21  3:18 AM  Result Value Ref Range   WBC 5.4 4.0 - 10.5 K/uL   RBC 3.77 (L) 4.22 - 5.81 MIL/uL   Hemoglobin 11.4 (L) 13.0 - 17.0 g/dL   HCT 36.8 (L) 39.0 - 52.0 %   MCV 97.6 80.0 - 100.0 fL   MCH 30.2 26.0 - 34.0 pg   MCHC 31.0 30.0 - 36.0 g/dL   RDW 12.7 11.5 - 15.5 %   Platelets 267 150 - 400 K/uL   nRBC 0.0 0.0 - 0.2 %    Comment: Performed at [facility], [place] [address], [place], [place] [address]  CBG monitoring, ED     Status: Abnormal   Collection Time: 12/08/21  5:02 AM  Result Value Ref Range   Glucose-Capillary 103 (H) 70 - 99 mg/dL    Comment: Glucose reference range applies only to samples taken after fasting for at least 8 hours.  ECHOCARDIOGRAM COMPLETE     Status: None   Collection Time: 12/08/21  9:04 AM  Result Value Ref Range   BP 139/95 mmHg   Single Plane A2C EF 46.6 %   Single Plane A4C EF 40.4 %   Calc EF 43.1 %   S' Lateral 4.30  cm   Area-P 1/2 4.10 cm2  CBC     Status: Abnormal   Collection Time: 12/08/21  9:49 AM  Result Value Ref Range   WBC 4.7 4.0 - 10.5 K/uL   RBC 3.92 (L) 4.22 - 5.81 MIL/uL   Hemoglobin 12.0 (L) 13.0 - 17.0 g/dL   HCT 39.0 39.0 - 52.0 %   MCV 99.5 80.0 - 100.0 fL   MCH 30.6 26.0 - 34.0 pg   MCHC 30.8 30.0 - 36.0 g/dL   RDW 12.7 11.5 - 15.5 %   Platelets  270 150 - 400 K/uL   nRBC 0.0 0.0 - 0.2 %    Comment: Performed at [facility], [place] [address], [place], [place] [address]  Creatinine, serum     Status: None   Collection Time: 12/08/21  9:49 AM  Result Value Ref Range   Creatinine, Ser 1.05 0.61 - 1.24 mg/dL   GFR, Estimated >60 >60 mL/min    Comment: (NOTE) Calculated using the CKD-EPI Creatinine Equation (2021) Performed at [facility] [address] [place], [place] [address]   Multiple Myeloma Panel (SPEP&IFE w/QIG)     Status: None   Collection Time: 12/08/21  9:49 AM  Result Value Ref Range   IgG (Immunoglobin G), Serum 1,107 603 - 1,613 mg/dL   IgA 298 61 - 437 mg/dL   IgM (Immunoglobulin M), Srm 68 15 - 143 mg/dL   Total Protein ELP 6.4 6.0 - 8.5 g/dL   Albumin SerPl Elph-Mcnc 3.6 2.9 - 4.4 g/dL   Alpha 1 0.1 0.0 - 0.4 g/dL   Alpha2 Glob SerPl Elph-Mcnc 0.7 0.4 - 1.0 g/dL   B-Globulin SerPl Elph-Mcnc 0.9 0.7 - 1.3 g/dL   Gamma Glob SerPl Elph-Mcnc 1.0 0.4 - 1.8 g/dL   M Protein SerPl Elph-Mcnc Not Observed Not Observed g/dL   Globulin, Total 2.8 2.2 - 3.9 g/dL   Albumin/Glob SerPl 1.3 0.7 - 1.7   IFE 1 Comment     Comment: (NOTE) The immunofixation pattern appears unremarkable. Evidence of monoclonal protein is not apparent.    Please Note Comment     Comment: (NOTE) Protein electrophoresis scan will follow via computer, mail, or courier delivery. Performed At: [facility] [place], [place] [id number] Rush Farmer MD PP:[id number]   CBC     Status: Abnormal   Collection Time: 12/09/21  4:54 AM  Result Value Ref  Range   WBC 6.1 4.0 - 10.5 K/uL   RBC 3.67 (L) 4.22 - 5.81 MIL/uL   Hemoglobin 11.5 (L) 13.0 - 17.0 g/dL   HCT 35.1 (L) 39.0 - 52.0 %   MCV 95.6 80.0 - 100.0 fL   MCH 31.3 26.0 - 34.0 pg   MCHC 32.8 30.0 - 36.0 g/dL   RDW 12.7 11.5 - 15.5 %   Platelets 229 150 - 400 K/uL   nRBC 0.0 0.0 - 0.2 %    Comment: Performed at [facility], [place] [address] [place], [place] [address]  Basic metabolic panel     Status: Abnormal   Collection Time: 12/09/21  4:54 AM  Result Value Ref Range   Sodium 139 135 - 145 mmol/L   Potassium 3.9 3.5 - 5.1 mmol/L   Chloride 108 98 - 111 mmol/L   CO2 23 22 - 32 mmol/L   Glucose, Bld 97 70 - 99 mg/dL    Comment: Glucose reference range applies only to samples taken after fasting for at least 8 hours.   BUN 12 8 - 23 mg/dL   Creatinine, Ser 0.97 0.61 - 1.24 mg/dL   Calcium 8.2 (L) 8.9 - 10.3 mg/dL   GFR, Estimated >60 >60 mL/min    Comment: (NOTE) Calculated using the CKD-EPI Creatinine Equation (2021)    Anion gap 8 5 - 15    Comment: Performed at [facility] [address], [place], [place] [address]  Lipoprotein A (LPA)     Status: Abnormal   Collection Time: 12/09/21  4:54 AM  Result Value Ref Range  Lipoprotein (a) 46.4 (H) <75.0 nmol/L    Comment: (NOTE) Note:  Values greater than or equal to 75.0 nmol/L may       indicate an independent risk factor for CHD,       but must be evaluated with caution when applied       to non-Caucasian populations due to the       influence of genetic factors on Lp(a) across       ethnicities. Performed At: [facility] [place], [place] [id number] Rush Farmer MD EH:[id number]   Surgical PCR screen     Status: None   Collection Time: 12/09/21 11:10 PM   Specimen: Nasal Mucosa; Nasal Swab  Result Value Ref Range   MRSA, PCR NEGATIVE NEGATIVE   Staphylococcus aureus NEGATIVE NEGATIVE    Comment: (NOTE) The Xpert SA Assay (FDA approved for NASAL specimens in patients  42 years of age and older), is one component of a comprehensive surveillance program. It is not intended to diagnose infection nor to guide or monitor treatment. Performed at [facility], [place] [address] [place], [place] [address]   Glucose, capillary     Status: Abnormal   Collection Time: 12/11/21  6:07 AM  Result Value Ref Range   Glucose-Capillary 103 (H) 70 - 99 mg/dL    Comment: Glucose reference range applies only to samples taken after fasting for at least 8 hours.   Comment 1 Notify RN    Comment 2 Document in Chart    Objective  Body mass index is 26.6 kg/m. Wt Readings from Last 3 Encounters:  12/15/21 248 lb 3.2 oz (112.6 kg)  12/11/21 244 lb 11.4 oz (111 kg)  12/03/21 253 lb 12.8 oz (115.1 kg)   Temp Readings from Last 3 Encounters:  12/15/21 98 F (36.7 C) (Oral)  12/11/21 97.8 F (36.6 C) (Oral)  10/19/21 97.8 F (36.6 C) (Oral)   BP Readings from Last 3 Encounters:  12/15/21 126/80  12/11/21 (!) 141/91  12/03/21 120/78   Pulse Readings from Last 3 Encounters:  12/15/21 67  12/11/21 63  12/03/21 75    Physical Exam Vitals and nursing note reviewed.  Constitutional:      Appearance: Normal appearance. He is well-developed and well-groomed.  HENT:     Head: Normocephalic and atraumatic.  Eyes:     Conjunctiva/sclera: Conjunctivae normal.     Pupils: Pupils are equal, round, and reactive to light.  Cardiovascular:     Rate and Rhythm: Normal rate and regular rhythm.     Heart sounds: Normal heart sounds.  Pulmonary:     Effort: Pulmonary effort is normal. No respiratory distress.     Breath sounds: Normal breath sounds.  Abdominal:     Tenderness: There is no abdominal tenderness.  Skin:    General: Skin is warm and moist.  Neurological:     General: No focal deficit present.     Mental Status: He is alert and oriented to person, place, and time. Mental status is at baseline.     Cranial Nerves: Cranial nerves 2-12 are intact.      Sensory: Sensation is intact.     Motor: Motor function is intact.     Coordination: Coordination is intact.     Gait: Gait is intact. Gait normal.  Psychiatric:        Attention and Perception: Attention and perception normal.        Mood and Affect: Mood and affect normal.  Speech: Speech normal.        Behavior: Behavior normal. Behavior is cooperative.        Thought Content: Thought content normal.        Cognition and Memory: Cognition and memory normal.        Judgment: Judgment normal.     Assessment  Plan  Chronic systolic heart failure (HCC) Chronic combined systolic and diastolic CHF (congestive heart failure) (HCC)  Nasal sore left nasal sore - Plan: Ambulatory referral to ENT Dr. Redmond Baseman  HM Flu shot utd prevnar and pna 23 utd 06/03/16 and 02/16/15  Tdap had 06/15/13  shingrix sent to pharmacy h/o shingles had 1/2 check on 2nd dose not had consider restart series in the future covid vx 4/4   Hep B immune  PSA 1.35 05/15/21 Hep C neg 06/03/16  D/c MMR check 2/2 cost  a1c 5.6 02/26/21    Colonoscopy 12/17/16 Dr. Moshe Salisbury  -had 12/17/16 rec repeat in 10 years  Referred today see above   EGD 11/15/19 prob healing ulcer in duodenum, mild deformity antrum likely from old ulcer disease bx negative, hiatal hernia Dr. Lyda Jester    Tobacco abuse long term-rec smoking cessation 1/2 ppd since age 64 y.o max 1 ppd quit 06/26/20 ? pulm referral as of 07/10/20 in the future consider pulm referral   Provider: Dr. Olivia Mackie [name]-Internal Medicine

## 2021-12-15 NOTE — Telephone Encounter (Signed)
Received referral for Mr. Glenetta Hew. Spoke to [name] and explained Paramedicine Home Visits and he was agreeable to visit tomorrow morning at 11:00. Call complete.   [name], EMT-Paramedic [phone] 12/15/2021

## 2021-12-15 NOTE — Progress Notes (Signed)
Cardiac Sarcoidosis Treatment Protocol Patient Handout Start Date 12/16/2021  Prednisone  Weeks 1-4 12/16/2021 through 01/12/2022 30 mg (three 10 mg tablets) daily  Weeks 5-8 01/13/2022 through 02/10/2022 25 mg (two and a half 10 mg tablets) daily  Weeks 9-12 02/11/2022 through 03/09/2022 20 mg (two 10 mg tablets) daily  Weeks 13-16 03/10/2022 through 04/06/2022 15 mg (one and a half 10 mg tablets) daily  Weeks 17-18 04/07/2022 through 04/20/2022 10 mg (two 5 mg tablets) daily Stop taking Bactrim on 04/07/2022 (start of week 17)  Weeks 19-20 04/21/2022 through 05/04/2020 7.5 mg (one and a half 5 mg tablets) daily  Weeks 21-22 05/05/2022 through 05/18/2022 5 mg (one 5 mg tablet) daily  Weeks 23-24 05/19/2022 through 06/01/2022 2.5 mg (half of a 5 mg tablet) daily  Week 25 06/02/2022 Stop prednisone   Methotrexate  Weeks 1-2 12/16/2021 & 12/23/2021 10 mg (four 2.5 mg tablets) once weekly  Weeks 3-4 12/30/2021 & 01/06/2022 12.5 mg (five 2.5 mg tablets) once weekly  Weeks 5-6 01/13/2022 & 01/20/2022 15 mg (six 2.5 mg tablets) once weekly  Weeks 7-8 01/27/2022 & 02/03/2022 17.5 mg (seven 2.5 mg tablets) once weekly  Weeks 9+ 02/10/2022 & on 20 mg (eight 2.5 mg tablets) once weekly

## 2021-12-15 NOTE — Progress Notes (Signed)
Paramedicine Initial Assessment:  Housing:  In what kind of housing do you live? House/apt/trailer/shelter? house  Do you live with anyone? wife  Are you currently worried about losing your housing? no   Social:  What is your current marital status? married  Do you have any children? son  Do you have family or friends who live locally? Son lives in [place]  Food:  No concerns with getting sufficient food  Income:  What is your current source of income? Retirement income   Insurance:  Are you currently insured?  yes  Do you have prescription coverage? yes    Transportation:  Do you have transportation to your medical appointments?yes- have their own care   Daily Health Needs: Do you have a working scale at home? No- one provided  How do you manage your medications at home? Given pill box today  Do you use any assistive devices at home or have PCS at home?  Do you have a PCP? Yes- Dr. French Ana [name]  Are there any additional barriers you see to getting the care you need? no  CSW will continue to follow through paramedicine program and assist as needed.  Burna Sis, LCSW Clinical Social Worker [facility] Desk#: [phone] Cell#: [phone]

## 2021-12-15 NOTE — Telephone Encounter (Signed)
Called to confirm Heart & Vascular Transitions of Care appointment at 3 pm on 12/15/21. Patient reminded to bring all medications and pill box organizer with them. Confirmed patient has transportation. Gave directions, instructed to utilize valet parking.  Confirmed appointment prior to ending call.    Rhae Hammock, BSN, Scientist, clinical (histocompatibility and immunogenetics) Only

## 2021-12-16 ENCOUNTER — Other Ambulatory Visit (HOSPITAL_COMMUNITY): Payer: Self-pay

## 2021-12-16 NOTE — Progress Notes (Signed)
Paramedicine Encounter    Patient ID: John Keith, male    DOB: July 13, 1950, 70 y.o.   MRN: [id number]   Arrived for home visit for John Keith who was ambulating around in the kitchen on my arrival reporting to be feeling okay today. He denied any chest pain, dizziness or shortness of breath.   Weight today 241lbs  He was seen in clinic yesterday by Dr. Gala Romney and Anna Genre NP who started him on some new medications and she and his Keith report they are very confused by how to take them and what they are for. I assured them I would help educate them and take over his medication management so he wouldn't have to worry or be confused. They were very grateful for this.   We went by each medication discussing the use, the dose and when to take it. I placed all meds in his pill box except the pantoprazole as he wishes to take it before all other meds in the morning separately. He has a few medications missing that he is waiting to pick up from [facility]: -Folic Acid -Prednisone -Methotrexate  -Calcium   I wrote down detailed instructions on where to place these in his pill box and how to take them. He plans to take methotrexate and prednisone as soon as he picks up as instructed and others will be placed in pill box.   I assisted him in ordering a BP cuff through [employer] and it will be delivered tomorrow to help keep close monitor of his BP with all new meds. He agreed and understood to check it twice daily as well as keeping a log of same along with his weight.   We discussed patient assistance forms for Sherryll Burger and Farxiga which were completed yesterday but tax documents needed to complete. John Keith reports she will have them ready for me next week at our visit.   He is also reporting his Eliquis is $45 I will help obtain a BMS application for same.   I will obtain samples if needed when refills are getting low to avoid financial strain. They were grateful for this.   He asked about  [name] I will follow up to ensure this gets scheduled once insurance is approved for same.   I plan to come out weekly on Wednesdays he agreed.   Home visit complete. My information left with patient and Keith in case they need to reach me before next week. They are agreeable to plan and felt much more comfortable with care plan after our visit.   [name], EMT-Paramedic [phone] 12/16/2021       Patient Care Team: [name], Pasty Spillers, MD as PCP - General (Internal Medicine) Chilton Si, MD as PCP - Cardiology (Cardiology)  Patient Active Problem List   Diagnosis Date Noted   Chronic systolic heart failure (HCC) 12/15/2021   Chronic combined systolic and diastolic CHF (congestive heart failure) (HCC) 12/15/2021   Nasal sore 12/15/2021   Syncope 12/07/2021   Ventricular tachycardia (HCC) 12/07/2021   Typical atrial flutter (HCC) 09/24/2021   Secondary hypercoagulable state (HCC) 09/24/2021   Abnormal x-ray of neck 06/16/2021   Abnormal MRI, lumbar spine 10/23/2020   Chronic low back pain 10/23/2020   Neuropathy 10/23/2020   Coronary artery disease of native artery of native heart with stable angina pectoris (HCC) 10/23/2020   Impingement syndrome of left shoulder region 08/22/2020   Pain in joint of left shoulder 07/24/2020   Chronic left shoulder  pain 07/11/2020   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   Anemia 12/13/2019   Chest pain of uncertain etiology 10/24/2019   PVC (premature ventricular contraction) 10/24/2019   Gastric ulcer without hemorrhage or perforation 09/28/2019   COVID-19 virus detected 07/12/2019   Elevated troponin 07/07/2019   Elevated serum protein level 07/07/2019   Postherpetic neuralgia 08/17/2018   Benign prostatic hyperplasia 08/01/2018   Hip pain 06/29/2018   Aortic atherosclerosis (HCC) 09/22/2016   Fatigue 06/03/2016   Lower urinary tract symptoms (LUTS) 06/03/2016   Constipation 03/10/2016   Lumbar  radiculopathy 02/20/2016   Encounter for immunization 11/26/2015   Insomnia 06/10/2015   External hemorrhoid 03/07/2015   Health care maintenance 01/09/2014   Carpal tunnel syndrome of left wrist 01/03/2013   Hyperlipidemia 11/09/2012   Vitamin D deficiency 11/09/2012   Essential hypertension 11/06/2012   Tobacco abuse 11/06/2012   Seasonal allergies 11/06/2012   Erectile dysfunction 11/06/2012   GERD (gastroesophageal reflux disease) 11/06/2012   DDD (degenerative disc disease), thoracolumbar 11/06/2012   Lactose intolerance 11/06/2012   Diverticulosis of colon without hemorrhage 11/06/2012    Current Outpatient Medications:    amiodarone (PACERONE) 200 MG tablet, Take 2 tablets (400 mg total) by mouth 2 (two) times daily for 7 days. (Patient not taking: Reported on 12/15/2021), Disp: 28 tablet, Rfl: 0   [START ON 12/19/2021] amiodarone (PACERONE) 200 MG tablet, Take 1 tablet (200 mg total) by mouth 2 (two) times daily for 7 days., Disp: 14 tablet, Rfl: 0   [START ON 12/27/2021] amiodarone (PACERONE) 200 MG tablet, Take 1 tablet (200 mg total) by mouth daily., Disp: 30 tablet, Rfl: 5   apixaban (ELIQUIS) 5 MG TABS tablet, Take 1 tablet (5 mg total) by mouth 2 (two) times daily., Disp: 60 tablet, Rfl: 3   calcium carbonate (OS-CAL - DOSED IN MG OF ELEMENTAL CALCIUM) 1250 (500 Ca) MG tablet, Take 1 tablet (500 mg of elemental calcium total) by mouth daily with breakfast., Disp: 30 tablet, Rfl: 11   carvedilol (COREG) 6.25 MG tablet, Take 1 tablet (6.25 mg total) by mouth 2 (two) times daily with a meal., Disp: 60 tablet, Rfl: 5   Cholecalciferol (VITAMIN D-3) 125 MCG (5000 UT) TABS, Take 1 tablet by mouth daily., Disp: 30 tablet, Rfl: 11   ezetimibe (ZETIA) 10 MG tablet, Take 1 tablet (10 mg total) by mouth daily., Disp: 90 tablet, Rfl: 3   ferrous sulfate 325 (65 FE) MG tablet, Take 325 mg by mouth daily with breakfast., Disp: , Rfl:    folic acid (FOLVITE) 1 MG tablet, Take 1 tablet (1 mg  total) by mouth daily., Disp: 30 tablet, Rfl: 11   gabapentin (NEURONTIN) 300 MG capsule, Take 3 capsules (900 mg total) by mouth at bedtime., Disp: 180 capsule, Rfl: 3   methotrexate (RHEUMATREX) 2.5 MG tablet, AS DIRECTED BY HEART FAILURE TEAM. Caution:Chemotherapy. Protect from light., Disp: 44 tablet, Rfl: 0   Multiple Vitamin (MULTIVITAMIN) tablet, Take 1 tablet by mouth daily., Disp: , Rfl:    pantoprazole (PROTONIX) 40 MG tablet, Take 1 tablet (40 mg total) by mouth daily. 30 min before food, Disp: 90 tablet, Rfl: 3   predniSONE (DELTASONE) 10 MG tablet, AS INSTRUCTED BY HEART FAILURE TEAM, Disp: 252 tablet, Rfl: 0   sacubitril-valsartan (ENTRESTO) 24-26 MG, Take 1 tablet by mouth 2 (two) times daily., Disp: 60 tablet, Rfl: 5   sulfamethoxazole-trimethoprim (BACTRIM DS) 800-160 MG tablet, Take 1 tablet by mouth every Monday, Wednesday, and Friday., Disp: 12 tablet, Rfl: 3 Allergies  Allergen Reactions   Crestor [Rosuvastatin Calcium] Other (See Comments)    Elevated muscle enzymes    Lipitor [Atorvastatin Calcium] Other (See Comments)    Elevated muscle enzymes    Tomato Itching     Social History   Socioeconomic History   Marital status: Married    Spouse name: Gwendolyn   Number of children: 2   Years of education: Not on file   Highest education level: High school graduate  Occupational History   Occupation: Retired    Comment: Semi  Tobacco Use   Smoking status: Former    Packs/day: 0.50    Types: Cigarettes    Start date: 07/06/1971    Quit date: 06/26/2020    Years since quitting: 1.4   Smokeless tobacco: Never   Tobacco comments:    Former smoker 09/24/21  Vaping Use   Vaping Use: Never used  Substance and Sexual Activity   Alcohol use: No    Alcohol/week: 0.0 standard drinks of alcohol   Drug use: No   Sexual activity: Not on file  Other Topics Concern   Not on file  Social History Narrative   Married    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    On child    1 son, 1 daughter    Systems analyst op   Owns guns    Wears seat belt, safe in relationship    Smoker    Retired 06/27/2019   Social Determinants of Health   Financial Resource Strain: Low Risk  (08/11/2020)   Overall Financial Resource Strain (CARDIA)    Difficulty of Paying Living Expenses: Not hard at all  Food Insecurity: No Food Insecurity (12/11/2021)   Hunger Vital Sign    Worried About Running Out of Food in the Last Year: Never true    Ran Out of Food in the Last Year: Never true  Transportation Needs: No Transportation Needs (12/11/2021)   PRAPARE - Administrator, Civil Service (Medical): No    Lack of Transportation (Non-Medical): No  Physical Activity: Sufficiently Active (08/11/2020)   Exercise Vital Sign    Days of Exercise per Week: 5 days    Minutes of Exercise per Session: 30 min  Stress: No Stress Concern Present (08/11/2020)   [employer] of Occupational Health - Occupational Stress Questionnaire    Feeling of Stress : Not at all  Social Connections: Unknown (08/11/2020)   Social Connection and Isolation Panel [NHANES]    Frequency of Communication with Friends and Family: More than three times a week    Frequency of Social Gatherings with Friends and Family: More than three times a week    Attends Religious Services: Not on file    Active Member of Clubs or Organizations: Yes    Attends Banker Meetings: Not on file    Marital Status: Married  Intimate Partner Violence: Not At Risk (08/11/2020)   Humiliation, Afraid, Rape, and Kick questionnaire    Fear of Current or Ex-Partner: No    Emotionally Abused: No    Physically Abused: No    Sexually Abused: No    Physical Exam      Future Appointments  Date Time Provider Department Center  12/17/2021  9:00 AM [facility]-ECHO/VAS [facility]  12/24/2021  9:20 AM [facility] DEVICE 1 [facility]  12/31/2021  9:15 AM Freddie Breech, DPM [facility]  01/01/2022  8:00 AM Alver Sorrow, NP  [facility]  01/06/2022 11:00 AM [facility] PHARMACY [facility] None  01/18/2022 10:40  AM [name], Bevelyn Buckles, MD [facility] None  01/26/2022 10:00 AM [name], Pasty Spillers, MD [facility]  03/12/2022  7:00 AM [facility] DEVICE REMOTES [facility]  03/16/2022  3:00 PM Regan Lemming, MD [facility]  06/11/2022  7:00 AM [facility] DEVICE REMOTES [facility]  09/10/2022  7:00 AM [facility] DEVICE REMOTES [facility]  12/10/2022  7:00 AM [facility] DEVICE REMOTES [facility]  03/11/2023  7:00 AM [facility] DEVICE REMOTES [facility]     ACTION: Home visit completed

## 2021-12-17 ENCOUNTER — Telehealth (HOSPITAL_COMMUNITY): Payer: Self-pay

## 2021-12-17 ENCOUNTER — Other Ambulatory Visit (HOSPITAL_BASED_OUTPATIENT_CLINIC_OR_DEPARTMENT_OTHER): Payer: PPO

## 2021-12-17 ENCOUNTER — Ambulatory Visit (INDEPENDENT_AMBULATORY_CARE_PROVIDER_SITE_OTHER): Payer: PPO

## 2021-12-17 DIAGNOSIS — R55 Syncope and collapse: Secondary | ICD-10-CM | POA: Diagnosis not present

## 2021-12-17 NOTE — Telephone Encounter (Signed)
Forms faxed to [facility] on 12/17/21 at 10.00am

## 2021-12-18 ENCOUNTER — Other Ambulatory Visit (HOSPITAL_COMMUNITY): Payer: Self-pay

## 2021-12-18 ENCOUNTER — Telehealth (HOSPITAL_BASED_OUTPATIENT_CLINIC_OR_DEPARTMENT_OTHER): Payer: Self-pay

## 2021-12-18 MED ORDER — SACUBITRIL-VALSARTAN 24-26 MG PO TABS: 1.0000 | ORAL_TABLET | Freq: Two times a day (BID) | ORAL | 3 refills | Status: DC

## 2021-12-18 NOTE — Telephone Encounter (Addendum)
Results called to patient who verbalizes understanding! Reminder set for scheduling.    ----- Message from Alver Sorrow, NP sent at 12/17/2021  7:39 PM EDT ----- Carotid duplex with bilateral 1-39% stenosis. No progression since 2019.  Good result! Repeat duplex in 2 years for monitoring.

## 2021-12-21 ENCOUNTER — Telehealth (HOSPITAL_COMMUNITY): Payer: Self-pay | Admitting: Pharmacy Technician

## 2021-12-21 NOTE — Telephone Encounter (Signed)
Advanced Heart Failure Patient Medical sales representative for [employer] assistance. Faxed Friday 06/16. Document scanned to chart.

## 2021-12-21 NOTE — Telephone Encounter (Signed)
Advanced Heart Failure Patient Advocate Encounter  Received [place] application for John Keith assistance. Faxed Friday 06/16. Document scanned to chart.

## 2021-12-21 NOTE — Progress Notes (Signed)
Advanced Heart Failure Clinic Note   Referring Physician: Dr. Elberta Fortis Primary Care: [name], French Ana, MD Primary Cardiologist: Dr. [facility] Salvia HF Cardiologist: Dr. Gala Romney  HPI:  Referred to clinic by Dr. Elberta Fortis for heart failure consultation. 71 y.o. male with history of recently diagnosed atrial flutter s/p DCCV 10/19/21, HTN, hx tobacco use, hx GI bleed.    He established with Afib clinic in March for atrial flutter. He underwent successful DCCV 10/19/21. Despite restoration of SR he noted recurrent palpitations, lightheadedness and chest discomfort at f/u 11/16/21. 14 day Zio placed. Seen in Cardiology clinic 12/03/21 with palpitations, recurrent near syncope and a syncopal episode. BP meds were reduced. Read on 14 day Zio was expedited and showed sustained episodes of VT (longest 1 minute 30 seconds).    Patient was referred to ED for admission d/t recurrent VT on monitor. EP was consulted. Echo: EF 35-40%, grade II DD, RV mildly reduced, RVSP 27 mmHg. Cardiac MRI: EF 33%, RVEF 40%, with multifocal areas of LGE with high intensity in basal septal LGE, elevated T2 values in lateral wall concerning for myocardial inflammation. Findings concerning for sarcoidosis. LHC demonstrated mild nonobstructive CAD. He was started on amiodarone and underwent ICD placement. Workup including PET for cardiac sarcoid recommended as outpatient.   Presented to [facility] 12/15/21 for hospital follow-up. Denied any recurrent dizziness or presyncope since discharge. No dyspnea, orthopnea, PND or lower extremity edema. His wife had been helping him with limiting sodium intake. Noted that he kept fluid intake to < 64 oz daily. Had been taking all meds as prescribed except taking amiodarone 200 mg BID instead of 400 mg BID. Denied ETOH or alcohol use.   Today he returns to HF clinic for pharmacist medication titration. At last visit with APP, Prednisone and methotrexate were started per cardiac sarcoid  protocol. Overall he is feeling well today. Came to clinic with his son-in-law. No dizziness, lightheadedness, chest pain or palpitations.  No SOB/DOE. Notes his weight has been stable at home, ~243 lbs. Weight up 1 lb from last clinic visit. Does not need a loop diuretic. No LEE, PND or orthopnea. Notes increased appetite since starting the prednisone. Now enrolled in HF paramedicine and they are helping him with his medications. Appreciate their assistance.      HF Medications: Carvedilol 6.25 mg BID Entresto 24/26 mg BID  Has the patient been experiencing any side effects to the medications prescribed?  Increased appetite with prednisone  Does the patient have any problems obtaining medications due to transportation or finances?   No insurance. Approved for [employer] PAP for Entresto and now [employer] PAP for [place].   Understanding of regimen: fair Understanding of indications: fair Potential of compliance: good - enrolled in HF paramedicine Patient understands to avoid NSAIDs. Patient understands to avoid decongestants.    Pertinent Lab Values: 12/15/21: Serum creatinine 1.33, BUN 20, Potassium 4.1, Sodium 139 BMET, cbc/diff pending  Vital Signs: Weight: 250.8 lbs (last clinic weight: 249 lbs) Blood pressure: 120/64  Heart rate: 57   Assessment/Plan: Chronic systolic CHF/NICM: -Echo 12/2021: EF 35-40%, grade II DD, RV mildly reduced, RVSP 27 mmHg. -Cardiac MRI 12/2021: EF 33%, RVEF 40%, with multifocal areas of LGE with high intensity in basal septal LGE, elevated T2 values in lateral wall concerning for myocardial inflammation. Findings concerning for sarcoidosis.  -LHC 12/2021: mild nonobstructive CAD. Suspect etiology cardiac sarcoidosis (most likely) vs hereditary LMNA cardiomyopathy -Previously referred for cardiac PET at [facility] -NYHA II. Volume appears stable.  -  BMET today pending - Does not require loop diuretic. -Continue carvedilol 6.25 mg BID -Continue Entresto 24/26  mg BID -Start Farxiga 10 mg daily  2. Cardiac Sarcoidosis -Cardiac MRI 12/2021: EF 33%, RVEF 40%, with multifocal areas of LGE with high intensity in basal septal LGE, elevated T2 values in lateral wall concerning for myocardial inflammation. Findings concerning for sarcoidosis. - Continue prednisone 30 mg daily per titration protocol. Decrease to 25 mg daily on 01/13/22 (see telephone note 12/15/21 for full titration details) -Continue methotrexate 12.5 mg every Wednesday. Plan to increase to 15 mg every Wednesday on 01/13/22 (see telephone note 12/15/21 for full titration details). -Previously referred for cardiac PET at [facility] -Continue Bactrim DS 1 tablet Mon-Wed- Fri while taking >15 mg of prednisone daily -Continue Folic Acid 1 mg daily  -Continue Calcium Citrate twice daily  -Continue vitamin D 1000 IU total daily  -Continue protonix 40 mg daily while on prednisone per titration protocol     3. VT/Hx syncope: - Sustained VT on recent monitor - S/p [employer] ICD - Continue amiodarone 200 mg daily - Followed by EP - No driving X 6 months   4. Atrial flutter: - On amiodarone as above - Continue Eliquis 5 BID. Denies bleeding issues.   5. CAD: - Mild on LHC 12/2021 - No aspirin with anticoagulation.  - Continue Zetia. Has history of elevated LFTs with statins.   6. HTN: - BP at goal  - Meds as above  Follow up 2 weeks with Dr. Elyse Jarvis, PharmD, BCPS, [facility], CPP Heart Failure Clinic Pharmacist [phone]

## 2021-12-23 ENCOUNTER — Other Ambulatory Visit (HOSPITAL_COMMUNITY): Payer: Self-pay

## 2021-12-23 ENCOUNTER — Telehealth (HOSPITAL_COMMUNITY): Payer: Self-pay | Admitting: *Deleted

## 2021-12-23 NOTE — Telephone Encounter (Incomplete)
Advanced Heart Failure Patient Advocate [employer] sent communication stating that the patient needs to provide POI in order to proceed with the application.

## 2021-12-23 NOTE — Telephone Encounter (Signed)
John Keith w/paramedicine called to see if pt needed to start farxiga. He received pt assistance and has the medication in home. Per last office note "-Consider SGLT2i or spiro next. Did not start today, initiating sarcoid protocol"  Routed to Dr.[name] for advice

## 2021-12-23 NOTE — Progress Notes (Signed)
Paramedicine Encounter    Patient ID: John Keith, male    DOB: [birth year], 71 y.o.   MRN: [id number]  Arrived for home visit for John Keith as he reports feeling good with no complaints. He is up walking around with no shortness of breath noted on ambulation. He denied chest pain, dizziness or shortness of breath. He says that over the last week he has noticed an increase in appetite with Prednisone but no other complaints or symptoms.   On assessment lungs clear, no swelling noted to lower legs or abdomen. Vitals obtained as noted:  WT- 243lbs BP- 110/72 HR- 62 RR- 18 O2- 95%  I reviewed medications and confirmed same. I filled pill box for one week. I asked HF triage about Marcelline Deist as he has the bottle but does not have it in his list- Dr. Gala Romney ordered to wait to start until after pharmacy visit on 7/5. We made a note on the bottle "not taking" and placed it in his bag of medications. All other meds present and accounted for placed in his pill box. He is short on Amiodarone for the week, I called [facility] and they are filling same and he can pick up today. I left written instructions on where it goes in his box and educated him and his wife on where to place it once they pick it up, they agreed with same.   I obtained tax documents for Sherryll Burger and Marcelline Deist to turn into clinic for patient assistance programs. I also started a BMS application for Eliquis and they are going to obtain OOP from [facility] of what they have spent for the year and I will attach same to application and turn in next week. I will obtain samples of Eliquis from clinic as John Keith expressed it was expensive for him to afford same.   Kabe reports [facility] called to schedule his PET Scan for the first week in August.   We reviewed all upcoming appointments and overall health management and plan to have another home visit next week on Wednesday.    Home visit complete. John Keith knows to reach out if needed until next visit.    [name], EMT-Paramedic [phone] 12/23/2021    Patient Care Team: [name], Pasty Spillers, MD as PCP - General (Internal Medicine) Chilton Si, MD as PCP - Cardiology (Cardiology)  Patient Active Problem List   Diagnosis Date Noted   Chronic systolic heart failure (HCC) 12/15/2021   Chronic combined systolic and diastolic CHF (congestive heart failure) (HCC) 12/15/2021   Nasal sore 12/15/2021   Syncope 12/07/2021   Ventricular tachycardia (HCC) 12/07/2021   Typical atrial flutter (HCC) 09/24/2021   Secondary hypercoagulable state (HCC) 09/24/2021   Abnormal x-ray of neck 06/16/2021   Abnormal MRI, lumbar spine 10/23/2020   Chronic low back pain 10/23/2020   Neuropathy 10/23/2020   Coronary artery disease of native artery of native heart with stable angina pectoris (HCC) 10/23/2020   Impingement syndrome of left shoulder region 08/22/2020   Pain in joint of left shoulder 07/24/2020   Chronic left shoulder pain 07/11/2020   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   Anemia 12/13/2019   Chest pain of uncertain etiology 10/24/2019   PVC (premature ventricular contraction) 10/24/2019   Gastric ulcer without hemorrhage or perforation 09/28/2019   COVID-19 virus detected 07/12/2019   Elevated troponin 07/07/2019   Elevated serum protein level 07/07/2019   Postherpetic neuralgia 08/17/2018   Benign prostatic hyperplasia 08/01/2018   Hip pain 06/29/2018   Aortic atherosclerosis (HCC) 09/22/2016  Fatigue 06/03/2016   Lower urinary tract symptoms (LUTS) 06/03/2016   Constipation 03/10/2016   Lumbar radiculopathy 02/20/2016   Encounter for immunization 11/26/2015   Insomnia 06/10/2015   External hemorrhoid 03/07/2015   Health care maintenance 01/09/2014   Carpal tunnel syndrome of left wrist 01/03/2013   Hyperlipidemia 11/09/2012   Vitamin D deficiency 11/09/2012   Essential hypertension 11/06/2012   Tobacco abuse 11/06/2012   Seasonal allergies 11/06/2012    Erectile dysfunction 11/06/2012   GERD (gastroesophageal reflux disease) 11/06/2012   DDD (degenerative disc disease), thoracolumbar 11/06/2012   Lactose intolerance 11/06/2012   Diverticulosis of colon without hemorrhage 11/06/2012    Current Outpatient Medications:    amiodarone (PACERONE) 200 MG tablet, Take 2 tablets (400 mg total) by mouth 2 (two) times daily for 7 days., Disp: 28 tablet, Rfl: 0   amiodarone (PACERONE) 200 MG tablet, Take 1 tablet (200 mg total) by mouth 2 (two) times daily for 7 days., Disp: 14 tablet, Rfl: 0   [START ON 12/27/2021] amiodarone (PACERONE) 200 MG tablet, Take 1 tablet (200 mg total) by mouth daily., Disp: 30 tablet, Rfl: 5   apixaban (ELIQUIS) 5 MG TABS tablet, Take 1 tablet (5 mg total) by mouth 2 (two) times daily., Disp: 60 tablet, Rfl: 3   calcium carbonate (OS-CAL - DOSED IN MG OF ELEMENTAL CALCIUM) 1250 (500 Ca) MG tablet, Take 1 tablet (500 mg of elemental calcium total) by mouth daily with breakfast., Disp: 30 tablet, Rfl: 11   carvedilol (COREG) 6.25 MG tablet, Take 1 tablet (6.25 mg total) by mouth 2 (two) times daily with a meal., Disp: 60 tablet, Rfl: 5   Cholecalciferol (VITAMIN D-3) 125 MCG (5000 UT) TABS, Take 1 tablet by mouth daily., Disp: 30 tablet, Rfl: 11   ezetimibe (ZETIA) 10 MG tablet, Take 1 tablet (10 mg total) by mouth daily., Disp: 90 tablet, Rfl: 3   ferrous sulfate 325 (65 FE) MG tablet, Take 325 mg by mouth daily with breakfast., Disp: , Rfl:    folic acid (FOLVITE) 1 MG tablet, Take 1 tablet (1 mg total) by mouth daily., Disp: 30 tablet, Rfl: 11   gabapentin (NEURONTIN) 300 MG capsule, Take 3 capsules (900 mg total) by mouth at bedtime., Disp: 180 capsule, Rfl: 3   methotrexate (RHEUMATREX) 2.5 MG tablet, AS DIRECTED BY HEART FAILURE TEAM. Caution:Chemotherapy. Protect from light., Disp: 44 tablet, Rfl: 0   Multiple Vitamin (MULTIVITAMIN) tablet, Take 1 tablet by mouth daily., Disp: , Rfl:    pantoprazole (PROTONIX) 40 MG  tablet, Take 1 tablet (40 mg total) by mouth daily. 30 min before food, Disp: 90 tablet, Rfl: 3   predniSONE (DELTASONE) 10 MG tablet, AS INSTRUCTED BY HEART FAILURE TEAM, Disp: 252 tablet, Rfl: 0   sacubitril-valsartan (ENTRESTO) 24-26 MG, Take 1 tablet by mouth 2 (two) times daily., Disp: 180 tablet, Rfl: 3   sulfamethoxazole-trimethoprim (BACTRIM DS) 800-160 MG tablet, Take 1 tablet by mouth every Monday, Wednesday, and Friday., Disp: 12 tablet, Rfl: 3 Allergies  Allergen Reactions   Crestor [Rosuvastatin Calcium] Other (See Comments)    Elevated muscle enzymes    Lipitor [Atorvastatin Calcium] Other (See Comments)    Elevated muscle enzymes    Tomato Itching     Social History   Socioeconomic History   Marital status: Married    Spouse name: Gwendolyn   Number of children: 2   Years of education: Not on file   Highest education level: High school graduate  Occupational History   Occupation:  Retired    Comment: Semi  Tobacco Use   Smoking status: Former    Packs/day: 0.50    Types: Cigarettes    Start date: 07/06/1971    Quit date: 06/26/2020    Years since quitting: 1.4   Smokeless tobacco: Never   Tobacco comments:    Former smoker 09/24/21  Vaping Use   Vaping Use: Never used  Substance and Sexual Activity   Alcohol use: No    Alcohol/week: 0.0 standard drinks of alcohol   Drug use: No   Sexual activity: Not on file  Other Topics Concern   Not on file  Social History Narrative   Married    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    On child    1 son, 1 daughter   Machine op   Owns guns    Wears seat belt, safe in relationship    Smoker    Retired 06/27/2019   Social Determinants of Health   Financial Resource Strain: Low Risk  (08/11/2020)   Overall Financial Resource Strain (CARDIA)    Difficulty of Paying Living Expenses: Not hard at all  Food Insecurity: No Food Insecurity (12/11/2021)   Hunger Vital Sign    Worried About Running Out of Food in the Last Year: Never true     Ran Out of Food in the Last Year: Never true  Transportation Needs: No Transportation Needs (12/11/2021)   PRAPARE - Administrator, Civil Service (Medical): No    Lack of Transportation (Non-Medical): No  Physical Activity: Sufficiently Active (08/11/2020)   Exercise Vital Sign    Days of Exercise per Week: 5 days    Minutes of Exercise per Session: 30 min  Stress: No Stress Concern Present (08/11/2020)   [employer] of Occupational Health - Occupational Stress Questionnaire    Feeling of Stress : Not at all  Social Connections: Unknown (08/11/2020)   Social Connection and Isolation Panel [NHANES]    Frequency of Communication with Friends and Family: More than three times a week    Frequency of Social Gatherings with Friends and Family: More than three times a week    Attends Religious Services: Not on file    Active Member of Clubs or Organizations: Yes    Attends Banker Meetings: Not on file    Marital Status: Married  Intimate Partner Violence: Not At Risk (08/11/2020)   Humiliation, Afraid, Rape, and Kick questionnaire    Fear of Current or Ex-Partner: No    Emotionally Abused: No    Physically Abused: No    Sexually Abused: No    Physical Exam      Future Appointments  Date Time Provider Department Center  12/24/2021  9:20 AM [facility] 1 [facility]  12/31/2021  9:15 AM Freddie Breech, DPM [facility]  01/01/2022  8:00 AM Alver Sorrow, NP [facility]  01/06/2022 11:00 AM [facility] None  01/18/2022 10:40 AM [name], Bevelyn Buckles, MD [facility] None  01/26/2022 10:00 AM [name], Pasty Spillers, MD [facility]  03/12/2022  7:00 AM [facility] DEV[facility] REMOTES [facility]  03/16/2022  3:00 PM Regan Lemming, MD [facility]  06/11/2022  7:00 AM [facility] REMOTES [facility]  09/10/2022  7:00 AM [facility] DEVICE REMOTES [facility]  12/10/2022   7:00 AM [facility] REM[facility]ES [facility]  03/11/2023  7:00 AM [facility] REMOTES [facility]     ACTION: Home visit completed

## 2021-12-24 ENCOUNTER — Ambulatory Visit: Payer: PPO | Admitting: Podiatry

## 2021-12-24 ENCOUNTER — Ambulatory Visit (INDEPENDENT_AMBULATORY_CARE_PROVIDER_SITE_OTHER): Payer: PPO

## 2021-12-24 DIAGNOSIS — I472 Ventricular tachycardia, unspecified: Secondary | ICD-10-CM

## 2021-12-24 NOTE — Patient Instructions (Addendum)
,    After Your ICD (Implantable Cardiac Defibrillator)    Monitor your defibrillator site for redness, swelling, and drainage. Call the device clinic at [phone] if you experience these symptoms or fever/chills.  Your incision was closed with Steri-strips or staples:  You may shower    and wash your incision with soap and water. Avoid lotions, ointments, or perfumes over your incision until it is well-healed.  You may use a hot tub or a pool after your wound check appointment if the incision is completely closed.  Do not lift, push or pull greater than 10 pounds with the affected arm until 6 weeks after your procedure. There are no other restrictions in arm movement after your wound check appointment. July 21  Your ICD is MRI compatible.  Your ICD is designed to protect you from life threatening heart rhythms. Because of this, you may receive a shock.   1 shock with no symptoms:  Call the office during business hours. 1 shock with symptoms (chest pain, chest pressure, dizziness, lightheadedness, shortness of breath, overall feeling unwell):  Call 911. If you experience 2 or more shocks in 24 hours:  Call 911. If you receive a shock, you should not drive.  [place] DMV - no driving for 6 months if you receive appropriate therapy from your ICD.   ICD Alerts:  Some alerts are vibratory and others beep. These are NOT emergencies. Please call our office to let us know. If this occurs at night or on weekends, it can wait until the next business day. Send a remote transmission.  If your device is capable of reading fluid status (for heart failure), you will be offered monthly monitoring to review this with you.   Remote monitoring is used to monitor your ICD from home. This monitoring is scheduled every 91 days by our office. It allows [place] to keep an eye on the functioning of your device to ensure it is working properly. You will routinely see your Electrophysiologist annually (more often if  necessary).

## 2021-12-26 LAB — CUP PACEART INCLINIC DEVICE CHECK
Battery Remaining Longevity: 136 mo
Battery Voltage: 3.13 V
Brady Statistic RV Percent Paced: 0.73 %
Date Time Interrogation Session: 20230622100500
HighPow Impedance: 66 Ohm
Implantable Lead Implant Date: 20230608
Implantable Lead Location: 753860
Implantable Pulse Generator Implant Date: 20230608
Lead Channel Impedance Value: 285 Ohm
Lead Channel Impedance Value: 380 Ohm
Lead Channel Pacing Threshold Amplitude: 1 V
Lead Channel Pacing Threshold Pulse Width: 0.4 ms
Lead Channel Sensing Intrinsic Amplitude: 10.5 mV
Lead Channel Setting Pacing Amplitude: 3.5 V
Lead Channel Setting Pacing Pulse Width: 0.4 ms
Lead Channel Setting Sensing Sensitivity: 0.3 mV

## 2021-12-30 ENCOUNTER — Telehealth (HOSPITAL_COMMUNITY): Payer: Self-pay | Admitting: Licensed Clinical Social Worker

## 2021-12-30 ENCOUNTER — Other Ambulatory Visit (HOSPITAL_COMMUNITY): Payer: Self-pay

## 2021-12-30 NOTE — Telephone Encounter (Signed)
Paramedic requesting samples of Eliquis for patient.  Patient is working on [employer] assistance application and will provide OOP expense report to paramedic today during their visit.  3 boxes of samples provided to hold pt over till application can be fully processed and hopefully approved.  Will continue to follow and assist as needed  Burna Sis, LCSW Clinical Social Worker [facility] Desk#: [phone] Cell#: [phone]

## 2021-12-30 NOTE — Progress Notes (Signed)
Paramedicine Encounter    Patient ID: John Keith, male    DOB: 1950/09/04, 71 y.o.   MRN: [id number]  Arrived for home visit for John Keith who reports feeling good with no complaints. He reports he has been feeling well with no symptoms to report over the last week. He denied shortness of breath more than usual with exertion, dizziness or chest pain. He has been compliant with all meds and pill box is empty on exam today.   Vitals and assessment obtained.  No lower leg swelling, lungs clear. Vitals within normal.   BP- 138/70  HR- 56 RR- 16 O2- 94% WT- 242lbs   I reviewed medications and filled pill box for two weeks as I will be out on vacation next week. I reviewed pill boxes with him and his wife and both are able to understand how to utilize over the next two weeks.   Refills [facility]: Amiodarone Carvedilol  Bactrim   We reviewed upcoming appointments. Dr. Gala Romney advised he did not need to keep the appointment with [facility] this week and to reschedule it for later this year as he is being closely followed at [facility] clinic for now. John Keith agreed and plans to call to cancel in the morning.    I reviewed appointments with John Keith and his wife and explained to take all meds and pill boxes to appointment with pharmacy clinic on 7/5. I will make John Keith aware and have her make any immediate changes in clinic due to my absence.    Home visit complete, John Keith left with numbers to HF clinic and community paramedicine team (John L. & John S.) if a need arises until 7/10.  John Keith, EMT-Paramedic [phone] 12/30/2021      Patient Care Team: [name], Pasty Spillers, MD as PCP - General (Internal Medicine) Chilton Si, MD as PCP - Cardiology (Cardiology)  Patient Active Problem List   Diagnosis Date Noted   Chronic systolic heart failure (HCC) 12/15/2021   Chronic combined systolic and diastolic CHF (congestive heart failure) (HCC) 12/15/2021    Nasal sore 12/15/2021   Syncope 12/07/2021   Ventricular tachycardia (HCC) 12/07/2021   Typical atrial flutter (HCC) 09/24/2021   Secondary hypercoagulable state (HCC) 09/24/2021   Abnormal x-ray of neck 06/16/2021   Abnormal MRI, lumbar spine 10/23/2020   Chronic low back pain 10/23/2020   Neuropathy 10/23/2020   Coronary artery disease of native artery of native heart with stable angina pectoris (HCC) 10/23/2020   Impingement syndrome of left shoulder region 08/22/2020   Pain in joint of left shoulder 07/24/2020   Chronic left shoulder pain 07/11/2020   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   Anemia 12/13/2019   Chest pain of uncertain etiology 10/24/2019   PVC (premature ventricular contraction) 10/24/2019   Gastric ulcer without hemorrhage or perforation 09/28/2019   COVID-19 virus detected 07/12/2019   Elevated troponin 07/07/2019   Elevated serum protein level 07/07/2019   Postherpetic neuralgia 08/17/2018   Benign prostatic hyperplasia 08/01/2018   Hip pain 06/29/2018   Aortic atherosclerosis (HCC) 09/22/2016   Fatigue 06/03/2016   Lower urinary tract symptoms (LUTS) 06/03/2016   Constipation 03/10/2016   Lumbar radiculopathy 02/20/2016   Encounter for immunization 11/26/2015   Insomnia 06/10/2015   External hemorrhoid 03/07/2015   Health care maintenance 01/09/2014   Carpal tunnel syndrome of left wrist 01/03/2013   Hyperlipidemia 11/09/2012   Vitamin D deficiency 11/09/2012   Essential hypertension 11/06/2012   Tobacco abuse 11/06/2012   Seasonal allergies 11/06/2012  Erectile dysfunction 11/06/2012   GERD (gastroesophageal reflux disease) 11/06/2012   DDD (degenerative disc disease), thoracolumbar 11/06/2012   Lactose intolerance 11/06/2012   Diverticulosis of colon without hemorrhage 11/06/2012    Current Outpatient Medications:    amiodarone (PACERONE) 200 MG tablet, Take 2 tablets (400 mg total) by mouth 2 (two) times daily for 7 days., Disp: 28 tablet, Rfl:  0   amiodarone (PACERONE) 200 MG tablet, Take 1 tablet (200 mg total) by mouth 2 (two) times daily for 7 days., Disp: 14 tablet, Rfl: 0   amiodarone (PACERONE) 200 MG tablet, Take 1 tablet (200 mg total) by mouth daily., Disp: 30 tablet, Rfl: 5   apixaban (ELIQUIS) 5 MG TABS tablet, Take 1 tablet (5 mg total) by mouth 2 (two) times daily., Disp: 60 tablet, Rfl: 3   calcium carbonate (OS-CAL - DOSED IN MG OF ELEMENTAL CALCIUM) 1250 (500 Ca) MG tablet, Take 1 tablet (500 mg of elemental calcium total) by mouth daily with breakfast., Disp: 30 tablet, Rfl: 11   carvedilol (COREG) 6.25 MG tablet, Take 1 tablet (6.25 mg total) by mouth 2 (two) times daily with a meal., Disp: 60 tablet, Rfl: 5   Cholecalciferol (VITAMIN D-3) 125 MCG (5000 UT) TABS, Take 1 tablet by mouth daily., Disp: 30 tablet, Rfl: 11   ezetimibe (ZETIA) 10 MG tablet, Take 1 tablet (10 mg total) by mouth daily., Disp: 90 tablet, Rfl: 3   ferrous sulfate 325 (65 FE) MG tablet, Take 325 mg by mouth daily with breakfast., Disp: , Rfl:    folic acid (FOLVITE) 1 MG tablet, Take 1 tablet (1 mg total) by mouth daily., Disp: 30 tablet, Rfl: 11   gabapentin (NEURONTIN) 300 MG capsule, Take 3 capsules (900 mg total) by mouth at bedtime., Disp: 180 capsule, Rfl: 3   methotrexate (RHEUMATREX) 2.5 MG tablet, AS DIRECTED BY HEART FAILURE TEAM. Caution:Chemotherapy. Protect from light., Disp: 44 tablet, Rfl: 0   Multiple Vitamin (MULTIVITAMIN) tablet, Take 1 tablet by mouth daily., Disp: , Rfl:    pantoprazole (PROTONIX) 40 MG tablet, Take 1 tablet (40 mg total) by mouth daily. 30 min before food, Disp: 90 tablet, Rfl: 3   predniSONE (DELTASONE) 10 MG tablet, AS INSTRUCTED BY HEART FAILURE TEAM, Disp: 252 tablet, Rfl: 0   sacubitril-valsartan (ENTRESTO) 24-26 MG, Take 1 tablet by mouth 2 (two) times daily., Disp: 180 tablet, Rfl: 3   sulfamethoxazole-trimethoprim (BACTRIM DS) 800-160 MG tablet, Take 1 tablet by mouth every Monday, Wednesday, and  Friday., Disp: 12 tablet, Rfl: 3 Allergies  Allergen Reactions   Crestor [Rosuvastatin Calcium] Other (See Comments)    Elevated muscle enzymes    Lipitor [Atorvastatin Calcium] Other (See Comments)    Elevated muscle enzymes      Social History   Socioeconomic History   Marital status: Married    Spouse name: Gwendolyn   Number of children: 2   Years of education: Not on file   Highest education level: High school graduate  Occupational History   Occupation: Retired    Comment: Semi  Tobacco Use   Smoking status: Former    Packs/day: 0.50    Types: Cigarettes    Start date: 07/06/1971    Quit date: 06/26/2020    Years since quitting: 1.5   Smokeless tobacco: Never   Tobacco comments:    Former smoker 09/24/21  Vaping Use   Vaping Use: Never used  Substance and Sexual Activity   Alcohol use: No    Alcohol/week: 0.0 standard  drinks of alcohol   Drug use: No   Sexual activity: Not on file  Other Topics Concern   Not on file  Social History Narrative   Married    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    On child    1 son, 1 daughter   Machine op   Owns guns    Wears seat belt, safe in relationship    Smoker    Retired 06/27/2019   Social Determinants of Health   Financial Resource Strain: Low Risk  (08/11/2020)   Overall Financial Resource Strain (CARDIA)    Difficulty of Paying Living Expenses: Not hard at all  Food Insecurity: No Food Insecurity (12/11/2021)   Hunger Vital Sign    Worried About Running Out of Food in the Last Year: Never true    Ran Out of Food in the Last Year: Never true  Transportation Needs: No Transportation Needs (12/11/2021)   PRAPARE - Administrator, Civil Service (Medical): No    Lack of Transportation (Non-Medical): No  Physical Activity: Sufficiently Active (08/11/2020)   Exercise Vital Sign    Days of Exercise per Week: 5 days    Minutes of Exercise per Session: 30 min  Stress: No Stress Concern Present (08/11/2020)   [employer] of  Occupational Health - Occupational Stress Questionnaire    Feeling of Stress : Not at all  Social Connections: Unknown (08/11/2020)   Social Connection and Isolation Panel [NHANES]    Frequency of Communication with Friends and Family: More than three times a week    Frequency of Social Gatherings with Friends and Family: More than three times a week    Attends Religious Services: Not on file    Active Member of Clubs or Organizations: Yes    Attends Banker Meetings: Not on file    Marital Status: Married  Intimate Partner Violence: Not At Risk (08/11/2020)   Humiliation, Afraid, Rape, and Kick questionnaire    Fear of Current or Ex-Partner: No    Emotionally Abused: No    Physically Abused: No    Sexually Abused: No    Physical Exam      Future Appointments  Date Time Provider Department Center  12/31/2021  9:15 AM Freddie Breech, DPM [facility]  01/01/2022  8:00 AM Alver Sorrow, NP [facility]  01/06/2022 11:00 AM [facility] None  01/18/2022 10:40 AM [name], Bevelyn Buckles, MD [facility] None  01/26/2022 10:00 AM [name], Pasty Spillers, MD [facility]  03/12/2022  7:00 AM [facility] REMOTES [facility]  03/16/2022  3:00 PM Regan Lemming, MD [facility]  06/11/2022  7:00 AM [facility] REMOTES [facility]  09/10/2022  7:00 AM [facility]  12/10/2022  7:00 AM [facility]ES [facility]  03/11/2023  7:00 AM [facility] REM[facility]ES [facility]     ACTION: Home visit completed

## 2021-12-31 ENCOUNTER — Ambulatory Visit: Payer: PPO | Admitting: Podiatry

## 2021-12-31 ENCOUNTER — Encounter: Payer: Self-pay | Admitting: Podiatry

## 2021-12-31 ENCOUNTER — Telehealth (HOSPITAL_COMMUNITY): Payer: Self-pay

## 2021-12-31 DIAGNOSIS — G629 Polyneuropathy, unspecified: Secondary | ICD-10-CM

## 2021-12-31 DIAGNOSIS — M79676 Pain in unspecified toe(s): Secondary | ICD-10-CM | POA: Diagnosis not present

## 2021-12-31 DIAGNOSIS — B351 Tinea unguium: Secondary | ICD-10-CM | POA: Diagnosis not present

## 2021-12-31 NOTE — Telephone Encounter (Signed)
[employer] application placed on desk of E. Hines on 12/31/2021 at 1400 for Eliquis patient assistance.    [name], EMT-Paramedic [phone] 12/31/2021

## 2021-12-31 NOTE — Progress Notes (Signed)
  Subjective:  Patient ID: John Keith, male    DOB: 1951/03/30,  MRN: [id number]  John Keith presents to clinic today for at risk foot care. Patient has history of secondary hypercoagulable state and peripheral neuropathy. He is on Eliquis and gabapentin. He is seen for painful thick toenails that are difficult to trim. Pain interferes with ambulation. Aggravating factors include wearing enclosed shoe gear. Pain is relieved with periodic professional debridement.  New problem(s): None.   PCP is [name], Pasty Spillers, MD , and last visit was December 15, 2021.  Allergies  Allergen Reactions   Crestor [Rosuvastatin Calcium] Other (See Comments)    Elevated muscle enzymes    Lipitor [Atorvastatin Calcium] Other (See Comments)    Elevated muscle enzymes     Review of Systems: Negative except as noted in the HPI.  Objective: No changes noted in today's physical examination. General: Patient is a pleasant 71 y.o. African American male in NAD. AAO x 3.   Neurovascular Examination: Capillary refill time to digits immediate b/l. Palpable pedal pulses b/l LE. Pedal hair present. No pain with calf compression b/l. Lower extremity skin temperature gradient within normal limits. No cyanosis or clubbing noted b/l LE.  Pt has subjective symptoms of neuropathy. Protective sensation intact 5/5 intact bilaterally with 10g monofilament b/l. Vibratory sensation intact b/l.  Dermatological:  Pedal skin is warm and supple b/l LE. No open wounds b/l LE. No interdigital macerations noted b/l LE. Toenails 1-5 bilaterally elongated, discolored, dystrophic, thickened, and crumbly with subungual debris and tenderness to dorsal palpation. Minimal plantar hyperkeratosis submet head 1 and 5 b/l.  Musculoskeletal:  Muscle strength 5/5 to all lower extremity muscle groups bilaterally. HAV with bunion deformity noted b/l LE. Hammertoe deformity noted 2-5 b/l. Patient ambulates independently without  assistance.     Latest Ref Rng & Units 12/15/2021    4:01 PM 02/26/2021    4:29 PM  Hemoglobin A1C  Hemoglobin-A1c 4.8 - 5.6 % 5.4  5.6    Assessment/Plan: 1. Pain due to onychomycosis of toenail   2. Neuropathy     -Patient was evaluated and treated. All patient's and/or POA's questions/concerns answered on today's visit. -No new findings. No new orders. -As a courtesy, minimal HKT filed with dremel without incident. -Patient to continue soft, supportive shoe gear daily. -Toenails 1-5 b/l were debrided in length and girth with sterile nail nippers and dremel without iatrogenic bleeding.  -Patient/POA to call should there be question/concern in the interim.   Return in about 3 months (around 04/02/2022).  Freddie Breech, DPM

## 2021-12-31 NOTE — Telephone Encounter (Signed)
Advanced Heart Failure Patient Advocate Encounter   Patient was approved to receive Entresto from [employer]  Effective dates: 12/29/21 through 07/04/22  Beckie Salts (EMT) approval information.Document scanned to chart.   Archer Asa, CPhT

## 2022-01-01 ENCOUNTER — Ambulatory Visit (HOSPITAL_BASED_OUTPATIENT_CLINIC_OR_DEPARTMENT_OTHER): Payer: PPO | Admitting: Family

## 2022-01-06 ENCOUNTER — Ambulatory Visit (HOSPITAL_COMMUNITY)
Admission: RE | Admit: 2022-01-06 | Discharge: 2022-01-06 | Disposition: A | Discharge status: Home / Self Care | Payer: PPO | Source: Ambulatory Visit | Attending: Cardiology | Admitting: Cardiology

## 2022-01-06 ENCOUNTER — Telehealth (HOSPITAL_COMMUNITY): Payer: Self-pay | Admitting: Pharmacist

## 2022-01-06 VITALS — BP 120/64 | HR 57 | Wt 250.8 lb

## 2022-01-06 DIAGNOSIS — D8689 Sarcoidosis of other sites: Secondary | ICD-10-CM | POA: Insufficient documentation

## 2022-01-06 DIAGNOSIS — K259 Gastric ulcer, unspecified as acute or chronic, without hemorrhage or perforation: Secondary | ICD-10-CM | POA: Diagnosis not present

## 2022-01-06 DIAGNOSIS — I5022 Chronic systolic (congestive) heart failure: Secondary | ICD-10-CM

## 2022-01-06 DIAGNOSIS — Z79899 Other long term (current) drug therapy: Secondary | ICD-10-CM | POA: Insufficient documentation

## 2022-01-06 DIAGNOSIS — R109 Unspecified abdominal pain: Secondary | ICD-10-CM

## 2022-01-06 DIAGNOSIS — Z87891 Personal history of nicotine dependence: Secondary | ICD-10-CM | POA: Diagnosis not present

## 2022-01-06 DIAGNOSIS — I110 Hypertensive heart disease with heart failure: Secondary | ICD-10-CM | POA: Insufficient documentation

## 2022-01-06 DIAGNOSIS — I4892 Unspecified atrial flutter: Secondary | ICD-10-CM | POA: Diagnosis not present

## 2022-01-06 DIAGNOSIS — Z7984 Long term (current) use of oral hypoglycemic drugs: Secondary | ICD-10-CM | POA: Diagnosis not present

## 2022-01-06 DIAGNOSIS — I428 Other cardiomyopathies: Secondary | ICD-10-CM | POA: Diagnosis not present

## 2022-01-06 DIAGNOSIS — Z7901 Long term (current) use of anticoagulants: Secondary | ICD-10-CM | POA: Diagnosis not present

## 2022-01-06 DIAGNOSIS — I472 Ventricular tachycardia, unspecified: Secondary | ICD-10-CM | POA: Insufficient documentation

## 2022-01-06 DIAGNOSIS — I2510 Atherosclerotic heart disease of native coronary artery without angina pectoris: Secondary | ICD-10-CM | POA: Diagnosis not present

## 2022-01-06 LAB — BASIC METABOLIC PANEL
Anion gap: 6 (ref 5–15)
BUN: 13 mg/dL (ref 8–23)
CO2: 23 mmol/L (ref 22–32)
Calcium: 8.7 mg/dL — ABNORMAL LOW (ref 8.9–10.3)
Chloride: 110 mmol/L (ref 98–111)
Creatinine, Ser: 1.47 mg/dL — ABNORMAL HIGH (ref 0.61–1.24)
GFR, Estimated: 50 mL/min — ABNORMAL LOW (ref >60)
Glucose, Bld: 103 mg/dL — ABNORMAL HIGH (ref 70–99)
Potassium: 4.1 mmol/L (ref 3.5–5.1)
Sodium: 139 mmol/L (ref 135–145)

## 2022-01-06 LAB — CBC WITH DIFFERENTIAL/PLATELET
Abs Immature Granulocytes: 0.03 10*3/uL (ref 0.00–0.07)
Basophils Absolute: 0 10*3/uL (ref 0.0–0.1)
Basophils Relative: 0 %
Eosinophils Absolute: 0.1 10*3/uL (ref 0.0–0.5)
Eosinophils Relative: 1 %
HCT: 38.7 % — ABNORMAL LOW (ref 39.0–52.0)
Hemoglobin: 12.1 g/dL — ABNORMAL LOW (ref 13.0–17.0)
Immature Granulocytes: 0 %
Lymphocytes Relative: 13 %
Lymphs Abs: 1.1 10*3/uL (ref 0.7–4.0)
MCH: 30.8 pg (ref 26.0–34.0)
MCHC: 31.3 g/dL (ref 30.0–36.0)
MCV: 98.5 fL (ref 80.0–100.0)
Monocytes Absolute: 0.5 10*3/uL (ref 0.1–1.0)
Monocytes Relative: 6 %
Neutro Abs: 7.1 10*3/uL (ref 1.7–7.7)
Neutrophils Relative %: 80 %
Platelets: 239 10*3/uL (ref 150–400)
RBC: 3.93 MIL/uL — ABNORMAL LOW (ref 4.22–5.81)
RDW: 14.1 % (ref 11.5–15.5)
WBC: 8.8 10*3/uL (ref 4.0–10.5)
nRBC: 0 % (ref 0.0–0.2)

## 2022-01-06 MED ORDER — FARXIGA 10 MG PO TABS: 10.0000 mg | ORAL_TABLET | Freq: Every day | ORAL | 3 refills | Status: DC

## 2022-01-06 NOTE — Patient Instructions (Addendum)
It was a pleasure seeing you today!  MEDICATIONS: -We are changing your medications today -Start Farxiga 10 mg (1 tablet) daily -Follow prednisone and methotrexate protocol instructions. Decreased Prednisone to 25 mg (2.5 tabs) daily on 01/13/22 and increase methotrexate to 15 mg every week on 01/13/22.  -Call if you have questions about your medications.  LABS: -We will call you if your labs need attention.  NEXT APPOINTMENT: Return to clinic in 2 weeks with Dr. Gala Romney.  In general, to take care of your heart failure: -Limit your fluid intake to 2 Liters (half-gallon) per day.   -Limit your salt intake to ideally 2-3 grams (2000-3000 mg) per day. -Weigh yourself daily and record, and bring that "weight diary" to your next appointment.  (Weight gain of 2-3 pounds in 1 day typically means fluid weight.) -The medications for your heart are to help your heart and help you live longer.   -Please contact us before stopping any of your heart medications.  Call the clinic at [phone] with questions or to reschedule future appointments.

## 2022-01-06 NOTE — Telephone Encounter (Signed)
Sent in Manufacturer's Assistance application to [employer] for Eliquis.    Application pending, will continue to follow.  Zania Kalisz, PharmD, BCPS, BCCP, CPP Heart Failure Clinic Pharmacist [phone]   

## 2022-01-08 NOTE — Telephone Encounter (Signed)
Advanced Heart Failure Patient Advocate Encounter   Patient was approved to receive Eliquis from [employer]  Effective dates: 01/08/22 through 07/04/22  Archer Asa, CPhT

## 2022-01-08 NOTE — Telephone Encounter (Signed)
Advanced Heart Failure Patient Advocate Encounter   Patient was approved to receive Farxiga from [employer]  Patient ID: PEP ID [id number] Effective dates: 12/22/21 through 07/04/22  Delivered 01/01/22.  Archer Asa, CPhT

## 2022-01-13 ENCOUNTER — Other Ambulatory Visit (HOSPITAL_COMMUNITY): Payer: Self-pay

## 2022-01-13 NOTE — Progress Notes (Signed)
Paramedicine Encounter    Patient ID: John Keith, male    DOB: 1951-01-06, 70 y.o.   MRN: [id number]   Arrived for home visit for John Keith who was seated at kitchen table alert and oriented with no complaints. He reports feeling well with no shortness of breath, dizziness or chest pain. He has been 100% med compliant over the last two weeks and denied any symptoms during that time.   I obtained vitals and assessment.   WT- 242.8lbs BP- 130/62 HR- 58 O2- 95% RR- 16  Lung sounds clear, no lower leg edema. Has compression socks on at present. Weight trending down- he is down 8 lbs since 7/5.   Viviano is now approved for Eliquis, Farxiga and [employer] patient assistance programs. He has received all in the mail except Eliquis which he will receive on Thursday. ( He has samples to last him one week for now)  I verified all meds and regimens and filled one pill box for one week. He will need one Amiodarone in Wednesday AM for next Wednesday. I called in same to [facility] and he plans to pick it up today and place it in box.   We reviewed upcoming appointments and confirmed same. I plan to see him in clinic with [name] on Monday morning. He agreed with plan and knows to bring pill box and meds.   Home visit complete.   [name], EMT-Paramedic [phone] 01/13/2022     Patient Care Team: [name], Pasty Spillers, MD as PCP - General (Internal Medicine) Chilton Si, MD as PCP - Cardiology (Cardiology)  Patient Active Problem List   Diagnosis Date Noted   Chronic systolic heart failure (HCC) 12/15/2021   Chronic combined systolic and diastolic CHF (congestive heart failure) (HCC) 12/15/2021   Nasal sore 12/15/2021   Syncope 12/07/2021   Ventricular tachycardia (HCC) 12/07/2021   Typical atrial flutter (HCC) 09/24/2021   Secondary hypercoagulable state (HCC) 09/24/2021   Abnormal x-ray of neck 06/16/2021   Abnormal MRI, lumbar spine 10/23/2020   Chronic low back pain  10/23/2020   Neuropathy 10/23/2020   Coronary artery disease of native artery of native heart with stable angina pectoris (HCC) 10/23/2020   Impingement syndrome of left shoulder region 08/22/2020   Pain in joint of left shoulder 07/24/2020   Chronic left shoulder pain 07/11/2020   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   Anemia 12/13/2019   Chest pain of uncertain etiology 10/24/2019   PVC (premature ventricular contraction) 10/24/2019   Gastric ulcer without hemorrhage or perforation 09/28/2019   COVID-19 virus detected 07/12/2019   Elevated troponin 07/07/2019   Elevated serum protein level 07/07/2019   Postherpetic neuralgia 08/17/2018   Benign prostatic hyperplasia 08/01/2018   Hip pain 06/29/2018   Aortic atherosclerosis (HCC) 09/22/2016   Fatigue 06/03/2016   Lower urinary tract symptoms (LUTS) 06/03/2016   Constipation 03/10/2016   Lumbar radiculopathy 02/20/2016   Encounter for immunization 11/26/2015   Insomnia 06/10/2015   External hemorrhoid 03/07/2015   Health care maintenance 01/09/2014   Carpal tunnel syndrome of left wrist 01/03/2013   Hyperlipidemia 11/09/2012   Vitamin D deficiency 11/09/2012   Essential hypertension 11/06/2012   Tobacco abuse 11/06/2012   Seasonal allergies 11/06/2012   Erectile dysfunction 11/06/2012   GERD (gastroesophageal reflux disease) 11/06/2012   DDD (degenerative disc disease), thoracolumbar 11/06/2012   Lactose intolerance 11/06/2012   Diverticulosis of colon without hemorrhage 11/06/2012    Current Outpatient Medications:    amiodarone (PACERONE) 200 MG tablet, Take 1 tablet (200 mg  total) by mouth daily., Disp: 30 tablet, Rfl: 5   apixaban (ELIQUIS) 5 MG TABS tablet, Take 1 tablet (5 mg total) by mouth 2 (two) times daily., Disp: 60 tablet, Rfl: 3   calcium carbonate (OS-CAL - DOSED IN MG OF ELEMENTAL CALCIUM) 1250 (500 Ca) MG tablet, Take 1 tablet (500 mg of elemental calcium total) by mouth daily with breakfast., Disp: 30 tablet,  Rfl: 11   carvedilol (COREG) 6.25 MG tablet, Take 1 tablet (6.25 mg total) by mouth 2 (two) times daily with a meal., Disp: 60 tablet, Rfl: 5   Cholecalciferol (VITAMIN D-3) 125 MCG (5000 UT) TABS, Take 1 tablet by mouth daily., Disp: 30 tablet, Rfl: 11   ezetimibe (ZETIA) 10 MG tablet, Take 1 tablet (10 mg total) by mouth daily., Disp: 90 tablet, Rfl: 3   FARXIGA 10 MG TABS tablet, Take 1 tablet (10 mg total) by mouth daily., Disp: 90 tablet, Rfl: 3   ferrous sulfate 325 (65 FE) MG tablet, Take 325 mg by mouth daily with breakfast., Disp: , Rfl:    folic acid (FOLVITE) 1 MG tablet, Take 1 tablet (1 mg total) by mouth daily., Disp: 30 tablet, Rfl: 11   gabapentin (NEURONTIN) 300 MG capsule, Take 3 capsules (900 mg total) by mouth at bedtime., Disp: 180 capsule, Rfl: 3   methotrexate (RHEUMATREX) 2.5 MG tablet, AS DIRECTED BY HEART FAILURE TEAM. Caution:Chemotherapy. Protect from light., Disp: 44 tablet, Rfl: 0   Multiple Vitamin (MULTIVITAMIN) tablet, Take 1 tablet by mouth daily., Disp: , Rfl:    pantoprazole (PROTONIX) 40 MG tablet, Take 1 tablet (40 mg total) by mouth daily. 30 min before food, Disp: 90 tablet, Rfl: 3   predniSONE (DELTASONE) 10 MG tablet, AS INSTRUCTED BY HEART FAILURE TEAM, Disp: 252 tablet, Rfl: 0   sacubitril-valsartan (ENTRESTO) 24-26 MG, Take 1 tablet by mouth 2 (two) times daily., Disp: 180 tablet, Rfl: 3   sulfamethoxazole-trimethoprim (BACTRIM DS) 800-160 MG tablet, Take 1 tablet by mouth every Monday, Wednesday, and Friday., Disp: 12 tablet, Rfl: 3 Allergies  Allergen Reactions   Crestor [Rosuvastatin Calcium] Other (See Comments)    Elevated muscle enzymes    Lipitor [Atorvastatin Calcium] Other (See Comments)    Elevated muscle enzymes      Social History   Socioeconomic History   Marital status: Married    Spouse name: Proofreader   Number of children: 2   Years of education: Not on file   Highest education level: High school graduate  Occupational  History   Occupation: Retired    Comment: Semi  Tobacco Use   Smoking status: Former    Packs/day: 0.50    Types: Cigarettes    Start date: 07/06/1971    Quit date: 06/26/2020    Years since quitting: 1.5   Smokeless tobacco: Never   Tobacco comments:    Former smoker 09/24/21  Vaping Use   Vaping Use: Never used  Substance and Sexual Activity   Alcohol use: No    Alcohol/week: 0.0 standard drinks of alcohol   Drug use: No   Sexual activity: Not on file  Other Topics Concern   Not on file  Social History Narrative   Married    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    On child    1 son, 1 daughter   Machine op   Owns guns    Wears seat belt, safe in relationship    Smoker    Retired 06/27/2019   Social Determinants of Health  Financial Resource Strain: Low Risk  (08/11/2020)   Overall Financial Resource Strain (CARDIA)    Difficulty of Paying Living Expenses: Not hard at all  Food Insecurity: No Food Insecurity (12/11/2021)   Hunger Vital Sign    Worried About Running Out of Food in the Last Year: Never true    Ran Out of Food in the Last Year: Never true  Transportation Needs: No Transportation Needs (12/11/2021)   PRAPARE - Administrator, Civil Service (Medical): No    Lack of Transportation (Non-Medical): No  Physical Activity: Sufficiently Active (08/11/2020)   Exercise Vital Sign    Days of Exercise per Week: 5 days    Minutes of Exercise per Session: 30 min  Stress: No Stress Concern Present (08/11/2020)   [employer] of Occupational Health - Occupational Stress Questionnaire    Feeling of Stress : Not at all  Social Connections: Unknown (08/11/2020)   Social Connection and Isolation Panel [NHANES]    Frequency of Communication with Friends and Family: More than three times a week    Frequency of Social Gatherings with Friends and Family: More than three times a week    Attends Religious Services: Not on file    Active Member of Clubs or Organizations: Yes    Attends Occupational hygienist Meetings: Not on file    Marital Status: Married  Intimate Partner Violence: Not At Risk (08/11/2020)   Humiliation, Afraid, Rape, and Kick questionnaire    Fear of Current or Ex-Partner: No    Emotionally Abused: No    Physically Abused: No    Sexually Abused: No    Physical Exam      Future Appointments  Date Time Provider Department Center  01/18/2022 10:40 AM [name], Bevelyn Buckles, MD [facility] None  01/26/2022 10:00 AM [name], Pasty Spillers, MD [facility]  03/12/2022  7:00 AM [facility] DEVICE REMOTES [facility]  03/16/2022  3:00 PM Regan Lemming, MD [facility]  04/08/2022  9:30 AM Freddie Breech, DPM [facility]  06/11/2022  7:00 AM [facility] DEVICE REMOTES [facility]  09/10/2022  7:00 AM [facility] DEVICE REMOTES [facility]  12/10/2022  7:00 AM [facility] DEVICE REMOTES [facility]  03/11/2023  7:00 AM [facility] DEVICE REMOTES [facility]     ACTION: Home visit completed Home visit completed

## 2022-01-18 ENCOUNTER — Ambulatory Visit (HOSPITAL_COMMUNITY)
Admission: RE | Admit: 2022-01-18 | Discharge: 2022-01-18 | Disposition: A | Discharge status: Home / Self Care | Payer: PPO | Source: Ambulatory Visit | Attending: Internal Medicine | Admitting: Internal Medicine

## 2022-01-18 ENCOUNTER — Other Ambulatory Visit (HOSPITAL_COMMUNITY): Payer: Self-pay

## 2022-01-18 ENCOUNTER — Encounter (HOSPITAL_COMMUNITY): Payer: Self-pay | Admitting: Internal Medicine

## 2022-01-18 VITALS — BP 110/60 | HR 56 | Wt 248.6 lb

## 2022-01-18 DIAGNOSIS — I5042 Chronic combined systolic (congestive) and diastolic (congestive) heart failure: Secondary | ICD-10-CM | POA: Diagnosis not present

## 2022-01-18 DIAGNOSIS — I428 Other cardiomyopathies: Secondary | ICD-10-CM | POA: Diagnosis not present

## 2022-01-18 DIAGNOSIS — I2510 Atherosclerotic heart disease of native coronary artery without angina pectoris: Secondary | ICD-10-CM | POA: Insufficient documentation

## 2022-01-18 DIAGNOSIS — D8685 Sarcoid myocarditis: Secondary | ICD-10-CM | POA: Diagnosis not present

## 2022-01-18 DIAGNOSIS — Z7901 Long term (current) use of anticoagulants: Secondary | ICD-10-CM | POA: Insufficient documentation

## 2022-01-18 DIAGNOSIS — Z79899 Other long term (current) drug therapy: Secondary | ICD-10-CM | POA: Diagnosis not present

## 2022-01-18 DIAGNOSIS — I110 Hypertensive heart disease with heart failure: Secondary | ICD-10-CM | POA: Diagnosis not present

## 2022-01-18 DIAGNOSIS — I472 Ventricular tachycardia, unspecified: Secondary | ICD-10-CM | POA: Diagnosis not present

## 2022-01-18 DIAGNOSIS — Z7952 Long term (current) use of systemic steroids: Secondary | ICD-10-CM | POA: Diagnosis not present

## 2022-01-18 DIAGNOSIS — I4892 Unspecified atrial flutter: Secondary | ICD-10-CM | POA: Insufficient documentation

## 2022-01-18 DIAGNOSIS — D8689 Sarcoidosis of other sites: Secondary | ICD-10-CM | POA: Insufficient documentation

## 2022-01-18 DIAGNOSIS — I5022 Chronic systolic (congestive) heart failure: Secondary | ICD-10-CM | POA: Insufficient documentation

## 2022-01-18 LAB — COMPREHENSIVE METABOLIC PANEL
ALT: 43 U/L (ref 0–44)
AST: 31 U/L (ref 15–41)
Albumin: 3.7 g/dL (ref 3.5–5.0)
Alkaline Phosphatase: 73 U/L (ref 38–126)
Anion gap: 13 (ref 5–15)
BUN: 19 mg/dL (ref 8–23)
CO2: 24 mmol/L (ref 22–32)
Calcium: 9.3 mg/dL (ref 8.9–10.3)
Chloride: 101 mmol/L (ref 98–111)
Creatinine, Ser: 1.24 mg/dL (ref 0.61–1.24)
GFR, Estimated: >60 mL/min (ref >60)
Glucose, Bld: 108 mg/dL — ABNORMAL HIGH (ref 70–99)
Potassium: 4.2 mmol/L (ref 3.5–5.1)
Sodium: 138 mmol/L (ref 135–145)
Total Bilirubin: 0.5 mg/dL (ref 0.3–1.2)
Total Protein: 6.7 g/dL (ref 6.5–8.1)

## 2022-01-18 LAB — CBC
HCT: 40.2 % (ref 39.0–52.0)
Hemoglobin: 12.8 g/dL — ABNORMAL LOW (ref 13.0–17.0)
MCH: 31.4 pg (ref 26.0–34.0)
MCHC: 31.8 g/dL (ref 30.0–36.0)
MCV: 98.8 fL (ref 80.0–100.0)
Platelets: 263 10*3/uL (ref 150–400)
RBC: 4.07 MIL/uL — ABNORMAL LOW (ref 4.22–5.81)
RDW: 14.7 % (ref 11.5–15.5)
WBC: 7.5 10*3/uL (ref 4.0–10.5)
nRBC: 0 % (ref 0.0–0.2)

## 2022-01-18 LAB — BRAIN NATRIURETIC PEPTIDE: B Natriuretic Peptide: 33.5 pg/mL (ref 0.0–100.0)

## 2022-01-18 MED ORDER — SPIRONOLACTONE 25 MG PO TABS: 12.5000 mg | ORAL_TABLET | Freq: Every day | ORAL | 2 refills | Status: DC

## 2022-01-18 NOTE — Progress Notes (Signed)
ADVANCED HEART FAILURE CLINIC NOTE    Referring Physician: Dr. Elberta Fortis Primary Care: [name], French Ana, MD Primary Cardiologist: Dr. Duke Salvia  HPI:  71 y.o. 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   He established with Afib clinic in March for atrial flutter. He underwent successful DCCV 04/17. Despite restoration of SR he noted recurrent palpitations, lightheadedness and chest discomfort at f/u 05/15. 14 day Zio placed. Seen in Cardiology clinic 12/03/21 with palpitations, recurrent near syncope and a syncopal episode. BP meds reduced. Read on 14 day Zio was expedited and showed sustained episodes of VT (longest 1 minute 30 seconds).   Patient was referred to ED for admission d/t recurrent VT on monitor. EP was consulted. Echo: EF 35-40%, grade II DD, RV mildly reduced, RVSP 27 mmHg. Cardiac MRI: EF 33%, RVEF 40%, with multifocal areas of LGE with high intensity in basal septal LGE, elevated T2 values in lateral wall concerning for myocardial inflammation. Findings concerning for sarcoidosis. LHC demonstrated mild nonobstructive CAD. He was started on amiodarone and underwent ICD placement 12/10/21. Workup including PET for cardiac sarcoid recommended as outpatient.  NM cardiac amyloid tumor LOC: Visual and quantitative assessment (grade 1, H/CL equal 1.28) is equivocally suggestive of transthyretin amyloidosis.  Here today for follow-up. Wife and paramedicine at bedside. Reports taking all medications with no problem. On prednisone 25 mg daily + MTX 15mg  once a week. Appetite is well. Weight at home 242 this morning, has been consistent. No ICD events per patient. Denies orthopnea, CP, dizziness, SOB, fevers or chills. No edema.   Denies ETOH or alcohol use.   His mother and grandmother had heart issues, uncertain if they had CHF.    Past Medical History:  Diagnosis Date    Allergy    Arthritis    Atrial flutter (HCC)    Carpal tunnel syndrome on left    Chest pain of uncertain etiology 10/24/2019   COVID-19    07/07/19   DDD (degenerative disc disease), thoracolumbar    multilevel   Degenerative joint disease of left shoulder    02/2011    Diverticulosis    left colon (2008)    ED (erectile dysfunction)    02/2011    GERD (gastroesophageal reflux disease)    H. pylori infection    Hyperlipidemia    Hypertension    Insomnia    Lactose intolerance    Low back pain    PVC (premature ventricular contraction) 10/24/2019   Shingles    07/2018   Smoking    smoking since age 74 y.o   Toe fracture, left    4th in 2014   Vitamin D deficiency     Current Outpatient Medications  Medication Sig Dispense Refill   amiodarone (PACERONE) 200 MG tablet Take 1 tablet (200 mg total) by mouth daily. 30 tablet 5   apixaban (ELIQUIS) 5 MG TABS tablet Take 1 tablet (5 mg total) by mouth 2 (two) times daily. 60 tablet 3   calcium carbonate (OS-CAL - DOSED IN MG OF ELEMENTAL CALCIUM) 1250 (500 Ca) MG tablet Take 1 tablet (500 mg of elemental calcium total) by mouth daily with breakfast. 30 tablet 11   carvedilol (COREG) 6.25 MG tablet Take 1 tablet (6.25 mg total) by mouth 2 (two) times daily with a meal. 60 tablet 5   Cholecalciferol (VITAMIN D-3) 125 MCG (5000 UT) TABS Take 1 tablet  by mouth daily. 30 tablet 11   ezetimibe (ZETIA) 10 MG tablet Take 1 tablet (10 mg total) by mouth daily. 90 tablet 3   FARXIGA 10 MG TABS tablet Take 1 tablet (10 mg total) by mouth daily. 90 tablet 3   ferrous sulfate 325 (65 FE) MG tablet Take 325 mg by mouth daily with breakfast.     folic acid (FOLVITE) 1 MG tablet Take 1 tablet (1 mg total) by mouth daily. 30 tablet 11   gabapentin (NEURONTIN) 300 MG capsule Take 3 capsules (900 mg total) by mouth at bedtime. 180 capsule 3   methotrexate (RHEUMATREX) 2.5 MG tablet AS DIRECTED BY HEART FAILURE TEAM. Caution:Chemotherapy. Protect from  light. 44 tablet 0   Multiple Vitamin (MULTIVITAMIN) tablet Take 1 tablet by mouth daily.     pantoprazole (PROTONIX) 40 MG tablet Take 1 tablet (40 mg total) by mouth daily. 30 min before food 90 tablet 3   predniSONE (DELTASONE) 10 MG tablet AS INSTRUCTED BY HEART FAILURE TEAM 252 tablet 0   sacubitril-valsartan (ENTRESTO) 24-26 MG Take 1 tablet by mouth 2 (two) times daily. 180 tablet 3   sulfamethoxazole-trimethoprim (BACTRIM DS) 800-160 MG tablet Take 1 tablet by mouth every Monday, Wednesday, and Friday. 12 tablet 3   No current facility-administered medications for this encounter.    Allergies  Allergen Reactions   Crestor [Rosuvastatin Calcium] Other (See Comments)    Elevated muscle enzymes    Lipitor [Atorvastatin Calcium] Other (See Comments)    Elevated muscle enzymes       Social History   Socioeconomic History   Marital status: Married    Spouse name: Gwendolyn   Number of children: 2   Years of education: Not on file   Highest education level: High school graduate  Occupational History   Occupation: Retired    Comment: Semi  Tobacco Use   Smoking status: Former    Packs/day: 0.50    Types: Cigarettes    Start date: 07/06/1971    Quit date: 06/26/2020    Years since quitting: 1.5   Smokeless tobacco: Never   Tobacco comments:    Former smoker 09/24/21  Vaping Use   Vaping Use: Never used  Substance and Sexual Activity   Alcohol use: No    Alcohol/week: 0.0 standard drinks of alcohol   Drug use: No   Sexual activity: Not on file  Other Topics Concern   Not on file  Social History Narrative   Married    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    On child    1 son, 1 daughter   Systems analyst op   Owns guns    Wears seat belt, safe in relationship    Smoker    Retired 06/27/2019   Social Determinants of Health   Financial Resource Strain: Low Risk  (08/11/2020)   Overall Financial Resource Strain (CARDIA)    Difficulty of Paying Living Expenses: Not hard at all  Food  Insecurity: No Food Insecurity (12/11/2021)   Hunger Vital Sign    Worried About Running Out of Food in the Last Year: Never true    Ran Out of Food in the Last Year: Never true  Transportation Needs: No Transportation Needs (12/11/2021)   PRAPARE - Administrator, Civil Service (Medical): No    Lack of Transportation (Non-Medical): No  Physical Activity: Sufficiently Active (08/11/2020)   Exercise Vital Sign    Days of Exercise per Week: 5 days    Minutes of Exercise  per Session: 30 min  Stress: No Stress Concern Present (08/11/2020)   [employer] of Occupational Health - Occupational Stress Questionnaire    Feeling of Stress : Not at all  Social Connections: Unknown (08/11/2020)   Social Connection and Isolation Panel [NHANES]    Frequency of Communication with Friends and Family: More than three times a week    Frequency of Social Gatherings with Friends and Family: More than three times a week    Attends Religious Services: Not on file    Active Member of Clubs or Organizations: Yes    Attends Banker Meetings: Not on file    Marital Status: Married  Intimate Partner Violence: Not At Risk (08/11/2020)   Humiliation, Afraid, Rape, and Kick questionnaire    Fear of Current or Ex-Partner: No    Emotionally Abused: No    Physically Abused: No    Sexually Abused: No      Family History  Problem Relation Age of Onset   Heart disease Mother        age 83    Throat cancer Father 20   Cancer Father        throat    Cancer Maternal Uncle        uncle ?maternal or paternal died colon cancer in his 66s    Diabetes Maternal Aunt    Heart attack Maternal Grandmother     Vitals:   01/18/22 1057  BP: 110/60  Pulse: (!) 56  SpO2: 96%  Weight: 112.8 kg (248 lb 9.6 oz)    PHYSICAL EXAM: General:  Well appearing. No resp difficulty HEENT: normal Neck: supple. no JVD. Carotids 2+ bilat; no bruits. No lymphadenopathy or thryomegaly appreciated. Cor: PMI  nondisplaced. Regular rate & rhythm. No rubs, gallops or murmurs. Lungs: clear Abdomen: soft, nontender, nondistended. No hepatosplenomegaly. No bruits or masses. Good bowel sounds. Extremities: no cyanosis, clubbing, rash, edema Neuro: alert & orientedx3, cranial nerves grossly intact. moves all 4 extremities w/o difficulty. Affect pleasant   ASSESSMENT & PLAN: Chronic systolic CHF/NICM: -Echo 06/23: EF 35-40%, grade II DD, RV mildly reduced, RVSP 27 mmHg. -Cardiac MRI 06/23: EF 33%, RVEF 40%, with multifocal areas of LGE with high intensity in basal septal LGE, elevated T2 values in lateral wall concerning for myocardial inflammation. Findings concerning for sarcoidosis.  -LHC 06/23: mild nonobstructive CAD. -Suspect etiology cardiac sarcoidosis (most likely) vs hereditary LMNA cardiomyopathy - Cardiac PET at [facility] scheduled for 02/05/22 - Doing well. NYHA II Volume looks good  -Continue coreg 6.25 BID -Continue Entresto 24/26 mg BID -Continue Farxiga 10mg  daily -Start spiro 12.5  -Labs today and 1 week  - Continue Paramedicine support  -[facility] for Cardiac Sarcoid / Advanced Heart Failure at [facility]   Prednisone  Weeks 1-4: 30 mg daily  Weeks 5-8 : 25 mg daily  Weeks 9-12: 20 mg daily  Weeks 13-16: 15 mg daily Weeks 17-18: 10 mg daily; d/c Bactrim Weeks 19-20: 7.5 mg daily  Weeks 21-22: 5 mg daily Weeks 23-24: 2.5 mg daily  Weeks 25: stop prednisone   Methotrexate Weeks 1&2: 10 mg (four 2.5 mg tablets) once weekly Weeks 3&4: 12.5 mg (five 2.5 mg tablets) once weekly Weeks 5&6: 15 mg (six 2.5 mg tablets) once weekly  Weeks 7&8: 17.5 mg (seven 2.5 mg tablets) once weekly Weeks 9+:  20 mg (eight 2.5 mg tablets ) once weekly   Additional Medications  Bactrim DS : 1 tablet Mon-Wed- Fri while taking >15  mg of prednisone daily   Folic Acid : 1 mg daily   Calcium: 1200-1500 mg daily   Vitamin D: [id number] IU total daily   Omeprazole: 20mg   daily while taking any dose of prednisone.   Monitoring   CBC with diff, AST, ALT, creatinine, and fasting blood glucose  Month 1  Month 2  Month 3  Every 3 months thereafter   2. VT/Hx syncope: - Sustained VT on recent monitor - Continue amiodarone 200 daily - Followed by EP - No driving X 6 months (until 12/23) - ICD interrogated personally. No VT. Volume ok activity level ~2h/day  3. Atrial flutter: - SR today - On amiodarone as above - Continue Eliquis 5 BID. Denies bleeding issues.  4. CAD: - Mild on LHC 06/23 - No aspirin with anticoagulation.  - Continue Zetia. Has history of elevated LFTs with statins. - No s/s angina  5. HTN: - BP at goal  - Meds as above  7/23, MD  6:46 PM

## 2022-01-18 NOTE — Patient Instructions (Signed)
START Spironolactone 12.5 mg Daily    Lab work done today we will call you with any abnormal results   Follow up lab work in 10 days    Your physician recommends that you schedule a follow-up appointment in: 2 months with app clinic    If you have any questions or concerns before your next appointment please send [place] a message through [place] or call our office at [phone].    TO LEAVE A MESSAGE FOR THE NURSE SELECT OPTION 2, PLEASE LEAVE A MESSAGE INCLUDING: YOUR NAME DATE OF BIRTH CALL BACK NUMBER REASON FOR CALL**this is important as we prioritize the call backs  YOU WILL RECEIVE A CALL BACK THE SAME DAY AS LONG AS YOU CALL BEFORE 4:00 PM  At the [facility], you and your health needs are our priority. As part of our continuing mission to provide you with exceptional heart care, we have created designated Provider Care Teams. These Care Teams include your primary Cardiologist (physician) and Advanced Practice Providers (APPs- Physician Assistants and Nurse Practitioners) who all work together to provide you with the care you need, when you need it.   You may see any of the following providers on your designated Care Team at your next follow up: Dr Arvilla Meres Dr Carron Curie, NP Robbie Lis, [facility], [place] Karle Plumber, PharmD   Please be sure to bring in all your medications bottles to every appointment.

## 2022-01-18 NOTE — Progress Notes (Signed)
Paramedicine Encounter    Patient ID: John Keith, male    DOB: 1950/08/21, 71 y.o.   MRN: [id number]   Met with John Keith in clinic today where he was seen by Dr. Haroldine Laws. John Keith had no complaints on exam and denied chest pain, shortness of breath or dizziness.   Dr. Haroldine Laws reported no signs of volume overload, and was happy with report of patient being med compliant and overall feeling well.   Med changes today -ADD 12.5MG SPIRONOLACTONE DAILY  John Keith and his wife plan to pick up same at [facility] today and add 1/2 tablet to morning rows in pill box. I wrote down written instructions for same also.   Refills needed next week: Gabapentin   Labs obtained today- will repeat same in one week.   We reviewed upcoming appointments and I will see John Keith in the home next Wednesday.   [name], [place] 01/18/2022       Patient Care Team: [name], [name], MD as PCP - General (Internal Medicine) [name], MD as PCP - Cardiology (Cardiology)  Patient Active Problem List   Diagnosis Date Noted   Chronic systolic heart failure ([place]) 12/15/2021   Chronic combined systolic and diastolic CHF (congestive heart failure) ([place]) 12/15/2021   Nasal sore 12/15/2021   Syncope 12/07/2021   Ventricular tachycardia ([place]) 12/07/2021   Typical atrial flutter ([place]) 09/24/2021   Secondary hypercoagulable state ([place]) 09/24/2021   Abnormal x-ray of neck 06/16/2021   Abnormal MRI, lumbar spine 10/23/2020   Chronic low back pain 10/23/2020   Neuropathy 10/23/2020   Coronary artery disease of native artery of native heart with stable angina pectoris ([place]) 10/23/2020   Impingement syndrome of left shoulder region 08/22/2020   Pain in joint of left shoulder 07/24/2020   Chronic left shoulder pain 07/11/2020   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   Anemia 12/13/2019   Chest pain of uncertain etiology 54/27/0623   PVC (premature ventricular contraction) 10/24/2019    Gastric ulcer without hemorrhage or perforation 09/28/2019   COVID-19 virus detected 07/12/2019   Elevated troponin 07/07/2019   Elevated serum protein level 07/07/2019   Postherpetic neuralgia 08/17/2018   Benign prostatic hyperplasia 08/01/2018   Hip pain 06/29/2018   Aortic atherosclerosis ([place]) 09/22/2016   Fatigue 06/03/2016   Lower urinary tract symptoms (LUTS) 06/03/2016   Constipation 03/10/2016   Lumbar radiculopathy 02/20/2016   Encounter for immunization 11/26/2015   Insomnia 06/10/2015   External hemorrhoid 03/07/2015   Health care maintenance 01/09/2014   Carpal tunnel syndrome of left wrist 01/03/2013   Hyperlipidemia 11/09/2012   Vitamin D deficiency 11/09/2012   Essential hypertension 11/06/2012   Tobacco abuse 11/06/2012   Seasonal allergies 11/06/2012   Erectile dysfunction 11/06/2012   GERD (gastroesophageal reflux disease) 11/06/2012   DDD (degenerative disc disease), thoracolumbar 11/06/2012   Lactose intolerance 11/06/2012   Diverticulosis of colon without hemorrhage 11/06/2012    Current Outpatient Medications:    amiodarone (PACERONE) 200 MG tablet, Take 1 tablet (200 mg total) by mouth daily., Disp: 30 tablet, Rfl: 5   apixaban (ELIQUIS) 5 MG TABS tablet, Take 1 tablet (5 mg total) by mouth 2 (two) times daily., Disp: 60 tablet, Rfl: 3   calcium carbonate (OS-CAL - DOSED IN MG OF ELEMENTAL CALCIUM) 1250 (500 Ca) MG tablet, Take 1 tablet (500 mg of elemental calcium total) by mouth daily with breakfast., Disp: 30 tablet, Rfl: 11   carvedilol (COREG) 6.25 MG tablet, Take 1 tablet (6.25 mg total) by mouth 2 (two)  times daily with a meal., Disp: 60 tablet, Rfl: 5   Cholecalciferol (VITAMIN D-3) 125 MCG (5000 UT) TABS, Take 1 tablet by mouth daily., Disp: 30 tablet, Rfl: 11   ezetimibe (ZETIA) 10 MG tablet, Take 1 tablet (10 mg total) by mouth daily., Disp: 90 tablet, Rfl: 3   FARXIGA 10 MG TABS tablet, Take 1 tablet (10 mg total) by mouth daily., Disp: 90  tablet, Rfl: 3   ferrous sulfate 325 (65 FE) MG tablet, Take 325 mg by mouth daily with breakfast., Disp: , Rfl:    folic acid (FOLVITE) 1 MG tablet, Take 1 tablet (1 mg total) by mouth daily., Disp: 30 tablet, Rfl: 11   gabapentin (NEURONTIN) 300 MG capsule, Take 3 capsules (900 mg total) by mouth at bedtime., Disp: 180 capsule, Rfl: 3   methotrexate (RHEUMATREX) 2.5 MG tablet, AS DIRECTED BY HEART FAILURE TEAM. Caution:Chemotherapy. Protect from light., Disp: 44 tablet, Rfl: 0   Multiple Vitamin (MULTIVITAMIN) tablet, Take 1 tablet by mouth daily., Disp: , Rfl:    pantoprazole (PROTONIX) 40 MG tablet, Take 1 tablet (40 mg total) by mouth daily. 30 min before food, Disp: 90 tablet, Rfl: 3   predniSONE (DELTASONE) 10 MG tablet, AS INSTRUCTED BY HEART FAILURE TEAM, Disp: 252 tablet, Rfl: 0   sacubitril-valsartan (ENTRESTO) 24-26 MG, Take 1 tablet by mouth 2 (two) times daily., Disp: 180 tablet, Rfl: 3   spironolactone (ALDACTONE) 25 MG tablet, Take 0.5 tablets (12.5 mg total) by mouth daily., Disp: 15 tablet, Rfl: 2   sulfamethoxazole-trimethoprim (BACTRIM DS) 800-160 MG tablet, Take 1 tablet by mouth every Monday, Wednesday, and Friday., Disp: 12 tablet, Rfl: 3 Allergies  Allergen Reactions   Crestor [Rosuvastatin Calcium] Other (See Comments)    Elevated muscle enzymes    Lipitor [Atorvastatin Calcium] Other (See Comments)    Elevated muscle enzymes      Social History   Socioeconomic History   Marital status: Married    Spouse name: Sales executive   Number of children: 2   Years of education: Not on file   Highest education level: High school graduate  Occupational History   Occupation: Retired    Comment: Semi  Tobacco Use   Smoking status: Former    Packs/day: 0.50    Types: Cigarettes    Start date: 07/06/1971    Quit date: 06/26/2020    Years since quitting: 1.5   Smokeless tobacco: Never   Tobacco comments:    Former smoker 09/24/21  Vaping Use   Vaping Use: Never used   Substance and Sexual Activity   Alcohol use: No    Alcohol/week: 0.0 standard drinks of alcohol   Drug use: No   Sexual activity: Not on file  Other Topics Concern   Not on file  Social History Narrative   Married    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    On child    1 son, 1 daughter   Social worker op   Owns guns    Wears seat belt, safe in relationship    Smoker    Retired 06/27/2019   Social Determinants of Health   Financial Resource Strain: [place]  (08/11/2020)   Overall Financial Resource Strain (CARDIA)    Difficulty of Paying Living Expenses: Not hard at all  Food Insecurity: No [place] (12/11/2021)   Hunger Vital Sign    Worried About Running Out of Food in the Last Year: Never true    [place] in the Last Year: Never true  Transportation Needs: No Transportation Needs (12/11/2021)   PRAPARE - Hydrologist (Medical): No    Lack of Transportation (Non-Medical): No  Physical Activity: Sufficiently Active (08/11/2020)   Exercise Vital Sign    Days of Exercise per Week: 5 days    Minutes of Exercise per Session: 30 min  Stress: No Stress Concern Present (08/11/2020)   [place]    Feeling of Stress : Not at all  Social Connections: Unknown (08/11/2020)   Social Connection and Isolation Panel [NHANES]    Frequency of Communication with Friends and Family: More than three times a week    Frequency of Social Gatherings with Friends and Family: More than three times a week    Attends Religious Services: Not on file    Active Member of Clubs or Organizations: Yes    Attends Archivist Meetings: Not on file    Marital Status: Married  Intimate Partner Violence: Not At Risk (08/11/2020)   Humiliation, Afraid, Rape, and Kick questionnaire    Fear of Current or Ex-Partner: No    Emotionally Abused: No    Physically Abused: No    Sexually Abused: No    Physical Exam      Future  Appointments  Date Time Provider [place]  01/26/2022 10:00 AM [name], [name], MD [facility]  01/28/2022  9:30 AM [facility] LAB [facility] None  03/12/2022  7:00 AM [facility] DEVICE REMOTES [facility]  03/16/2022  3:00 PM [name], MD [facility]  03/22/2022  8:30 AM [facility] PA/NP [facility] None  04/08/2022  9:30 AM Marzetta Board, DPM [facility]  06/11/2022  7:00 AM [facility] DEVICE REMOTES [facility]  09/10/2022  7:00 AM [facility] DEVICE REMOTES [facility]  12/10/2022  7:00 AM [facility] DEVICE REMOTES [facility]  03/11/2023  7:00 AM [facility] DEVICE REMOTES [facility]     ACTION: Home visit completed

## 2022-01-20 ENCOUNTER — Other Ambulatory Visit: Payer: Self-pay | Admitting: Family Medicine

## 2022-01-20 ENCOUNTER — Telehealth (HOSPITAL_COMMUNITY): Payer: Self-pay | Admitting: Licensed Clinical Social Worker

## 2022-01-20 DIAGNOSIS — G629 Polyneuropathy, unspecified: Secondary | ICD-10-CM

## 2022-01-20 NOTE — Telephone Encounter (Signed)
HF Paramedicine Team Based Care Meeting  HF MD- NA  HF NP - Amy Clegg NP-C   [facility] HF Paramedicine  Katie Vicente Males  [facility] admit within the last 30 days for heart failure? no  Medications concerns? Not good about managing own meds right now with recent med changes- on a taper  SDOH - no concerns  Eligible for discharge? Medically looking good but not ready to manage own meds- continuing taper until December at the earliest  Burna Sis, LCSW Clinical Social Worker [facility] Desk#: [phone] Cell#: [phone]

## 2022-01-26 ENCOUNTER — Ambulatory Visit: Payer: PPO | Admitting: Internal Medicine

## 2022-01-27 ENCOUNTER — Other Ambulatory Visit (HOSPITAL_COMMUNITY): Payer: Self-pay | Admitting: Physician Assistant

## 2022-01-27 ENCOUNTER — Other Ambulatory Visit (HOSPITAL_COMMUNITY): Payer: Self-pay

## 2022-01-27 NOTE — Progress Notes (Signed)
Paramedicine Encounter    Patient ID: John Keith, male    DOB: 08-09-1950, 71 y.o.   MRN: [id number]   Arrived for home visit for Luismiguel who reports to be feeling good. He denied any chest pain, dizziness, shortness of breath or swelling over the last week. He has been 100% med compliant and says the only side effect he has noticed is an increased appetite. (Prednisone)   Vitals and assessment obtained: WT- 244lbs BP- 110/62  HR- 62 O2- 95% RR-16   He has no lower leg edema noted, lungs clear and no JVD or distended abdomen. He denied difficulty breathing or increased fatigue.   He reports having increased urine output but no dizziness or decrease in BP as he checks it daily with his weight using his automated BP cuff we ordered for him. Readings are close to my manual BP checks during our visits.   I reviewed all meds and confirmed same- pill box filled for one week.   Refills as noted: (called into [facility]) Carvedilol Methotrexate Sulfamethoxazole   Chanel has a PET Scan scheduled at [facility] on AUG 4th and is needing some further directions on where to report to- I am researching this for him and will have that info by our next home visit next Wednesday.   Appointments reviewed and confirmed. Home visit complete.   [name], EMT-Paramedic [phone] 01/27/2022    Patient Care Team: [name], Pasty Spillers, MD as PCP - General (Internal Medicine) Chilton Si, MD as PCP - Cardiology (Cardiology)  Patient Active Problem List   Diagnosis Date Noted   Chronic systolic heart failure (H[facility]) 12/15/2021   Chronic combined systolic and diastolic CHF (congestive heart failure) (HCC) 12/15/2021   Nasal sore 12/15/2021   Syncope 12/07/2021   Ventricular tachycardia (H[facility]) 12/07/2021   Typical atrial flutter (H[facility]) 09/24/2021   Secondary hypercoagulable state (H[facility]) 09/24/2021   Abnormal x-ray of neck 06/16/2021   Abnormal MRI, lumbar spine 10/23/2020   Chronic low  back pain 10/23/2020   Neuropathy 10/23/2020   Coronary artery disease of native artery of native heart with stable angina pectoris (HCC) 10/23/2020   Impingement syndrome of left shoulder region 08/22/2020   Pain in joint of left shoulder 07/24/2020   Chronic left shoulder pain 07/11/2020   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   Anemia 12/13/2019   Chest pain of uncertain etiology 10/24/2019   PVC (premature ventricular contraction) 10/24/2019   Gastric ulcer without hemorrhage or perforation 09/28/2019   COVID-19 virus detected 07/12/2019   Elevated troponin 07/07/2019   Elevated serum protein level 07/07/2019   Postherpetic neuralgia 08/17/2018   Benign prostatic hyperplasia 08/01/2018   Hip pain 06/29/2018   Aortic atherosclerosis (HCC) 09/22/2016   Fatigue 06/03/2016   Lower urinary tract symptoms (LUTS) 06/03/2016   Constipation 03/10/2016   Lumbar radiculopathy 02/20/2016   Encounter for immunization 11/26/2015   Insomnia 06/10/2015   External hemorrhoid 03/07/2015   Health care maintenance 01/09/2014   Carpal tunnel syndrome of left wrist 01/03/2013   Hyperlipidemia 11/09/2012   Vitamin D deficiency 11/09/2012   Essential hypertension 11/06/2012   Tobacco abuse 11/06/2012   Seasonal allergies 11/06/2012   Erectile dysfunction 11/06/2012   GERD (gastroesophageal reflux disease) 11/06/2012   DDD (degenerative disc disease), thoracolumbar 11/06/2012   Lactose intolerance 11/06/2012   Diverticulosis of colon without hemorrhage 11/06/2012    Current Outpatient Medications:    amiodarone (PACERONE) 200 MG tablet, Take 1 tablet (200 mg total) by mouth daily., Disp: 30 tablet, Rfl: 5  apixaban (ELIQUIS) 5 MG TABS tablet, Take 1 tablet (5 mg total) by mouth 2 (two) times daily., Disp: 60 tablet, Rfl: 3   calcium carbonate (OS-CAL - DOSED IN MG OF ELEMENTAL CALCIUM) 1250 (500 Ca) MG tablet, Take 1 tablet (500 mg of elemental calcium total) by mouth daily with breakfast., Disp: 30  tablet, Rfl: 11   carvedilol (COREG) 6.25 MG tablet, Take 1 tablet (6.25 mg total) by mouth 2 (two) times daily with a meal., Disp: 60 tablet, Rfl: 5   Cholecalciferol (VITAMIN D-3) 125 MCG (5000 UT) TABS, Take 1 tablet by mouth daily., Disp: 30 tablet, Rfl: 11   ezetimibe (ZETIA) 10 MG tablet, Take 1 tablet (10 mg total) by mouth daily., Disp: 90 tablet, Rfl: 3   FARXIGA 10 MG TABS tablet, Take 1 tablet (10 mg total) by mouth daily., Disp: 90 tablet, Rfl: 3   ferrous sulfate 325 (65 FE) MG tablet, Take 325 mg by mouth daily with breakfast., Disp: , Rfl:    folic acid (FOLVITE) 1 MG tablet, Take 1 tablet (1 mg total) by mouth daily., Disp: 30 tablet, Rfl: 11   gabapentin (NEURONTIN) 300 MG capsule, TAKE 3 CAPSULES(900 MG) BY MOUTH AT BEDTIME, Disp: 180 capsule, Rfl: 3   methotrexate (RHEUMATREX) 2.5 MG tablet, AS DIRECTED BY HEART FAILURE TEAM. Caution:Chemotherapy. Protect from light., Disp: 44 tablet, Rfl: 0   Multiple Vitamin (MULTIVITAMIN) tablet, Take 1 tablet by mouth daily., Disp: , Rfl:    pantoprazole (PROTONIX) 40 MG tablet, Take 1 tablet (40 mg total) by mouth daily. 30 min before food, Disp: 90 tablet, Rfl: 3   predniSONE (DELTASONE) 10 MG tablet, AS INSTRUCTED BY HEART FAILURE TEAM, Disp: 252 tablet, Rfl: 0   sacubitril-valsartan (ENTRESTO) 24-26 MG, Take 1 tablet by mouth 2 (two) times daily., Disp: 180 tablet, Rfl: 3   spironolactone (ALDACTONE) 25 MG tablet, Take 0.5 tablets (12.5 mg total) by mouth daily., Disp: 15 tablet, Rfl: 2   sulfamethoxazole-trimethoprim (BACTRIM DS) 800-160 MG tablet, Take 1 tablet by mouth every Monday, Wednesday, and Friday., Disp: 12 tablet, Rfl: 3 Allergies  Allergen Reactions   Crestor [Rosuvastatin Calcium] Other (See Comments)    Elevated muscle enzymes    Lipitor [Atorvastatin Calcium] Other (See Comments)    Elevated muscle enzymes      Social History   Socioeconomic History   Marital status: Married    Spouse name: Proofreader   Number  of children: 2   Years of education: Not on file   Highest education level: High school graduate  Occupational History   Occupation: Retired    Comment: Semi  Tobacco Use   Smoking status: Former    Packs/day: 0.50    Types: Cigarettes    Start date: 07/06/1971    Quit date: 06/26/2020    Years since quitting: 1.5   Smokeless tobacco: Never   Tobacco comments:    Former smoker 09/24/21  Vaping Use   Vaping Use: Never used  Substance and Sexual Activity   Alcohol use: No    Alcohol/week: 0.0 standard drinks of alcohol   Drug use: No   Sexual activity: Not on file  Other Topics Concern   Not on file  Social History Narrative   Married    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    On child    1 son, 1 daughter   Machine op   Owns guns    Wears seat belt, safe in relationship    Smoker    Retired 06/27/2019  Social Determinants of Health   Financial Resource Strain: [place]  (08/11/2020)   Overall Financial Resource Strain (CARDIA)    Difficulty of Paying Living Expenses: Not hard at all  Food Insecurity: No Food Insecurity (12/11/2021)   Hunger Vital Sign    Worried About Running Out of Food in the Last Year: Never true    Ran Out of Food in the Last Year: Never true  Transportation Needs: No Transportation Needs (12/11/2021)   PRAPARE - Administrator, Civil Service (Medical): No    Lack of Transportation (Non-Medical): No  Physical Activity: Sufficiently Active (08/11/2020)   Exercise Vital Sign    Days of Exercise per Week: 5 days    Minutes of Exercise per Session: 30 min  Stress: No Stress Concern Present (08/11/2020)   [employer] of Occupational Health - Occupational Stress Questionnaire    Feeling of Stress : Not at all  Social Connections: Unknown (08/11/2020)   Social Connection and Isolation Panel [NHANES]    Frequency of Communication with Friends and Family: More than three times a week    Frequency of Social Gatherings with Friends and Family: More than three times a  week    Attends Religious Services: Not on file    Active Member of Clubs or Organizations: Yes    Attends Banker Meetings: Not on file    Marital Status: Married  Intimate Partner Violence: Not At Risk (08/11/2020)   Humiliation, Afraid, Rape, and Kick questionnaire    Fear of Current or Ex-Partner: No    Emotionally Abused: No    Physically Abused: No    Sexually Abused: No    Physical Exam      Future Appointments  Date Time Provider Department Center  01/28/2022  9:30 AM [facility] LAB [facility] None  01/28/2022  1:00 PM [name], Pasty Spillers, MD [facility]  03/12/2022  7:00 AM [facility] DEVICE REMOTES [facility]  03/16/2022  3:00 PM Regan Lemming, MD [facility]  03/22/2022  8:30 AM [facility] PA/NP [facility] None  04/08/2022  9:30 AM Freddie Breech, DPM [facility]  06/11/2022  7:00 AM [facility] DEVICE REMOTES [facility]  09/10/2022  7:00 AM [facility] DEVICE REMOTES [facility]  12/10/2022  7:00 AM [facility] DEVICE REMOTES [facility]  03/11/2023  7:00 AM [facility] DEVICE REMOTES [facility]     ACTION: Home visit completed

## 2022-01-28 ENCOUNTER — Encounter: Payer: Self-pay | Admitting: Internal Medicine

## 2022-01-28 ENCOUNTER — Ambulatory Visit (INDEPENDENT_AMBULATORY_CARE_PROVIDER_SITE_OTHER): Payer: PPO | Admitting: Internal Medicine

## 2022-01-28 ENCOUNTER — Ambulatory Visit (HOSPITAL_COMMUNITY)
Admission: RE | Admit: 2022-01-28 | Discharge: 2022-01-28 | Disposition: A | Discharge status: Home / Self Care | Payer: PPO | Source: Ambulatory Visit | Attending: Cardiology | Admitting: Cardiology

## 2022-01-28 VITALS — BP 122/60 | HR 86 | Temp 98.5°F | Ht >= 80 in | Wt 250.6 lb

## 2022-01-28 DIAGNOSIS — I472 Ventricular tachycardia, unspecified: Secondary | ICD-10-CM | POA: Diagnosis not present

## 2022-01-28 DIAGNOSIS — I5042 Chronic combined systolic (congestive) and diastolic (congestive) heart failure: Secondary | ICD-10-CM | POA: Insufficient documentation

## 2022-01-28 LAB — BASIC METABOLIC PANEL
Anion gap: 9 (ref 5–15)
BUN: 14 mg/dL (ref 8–23)
CO2: 26 mmol/L (ref 22–32)
Calcium: 9.1 mg/dL (ref 8.9–10.3)
Chloride: 105 mmol/L (ref 98–111)
Creatinine, Ser: 1.45 mg/dL — ABNORMAL HIGH (ref 0.61–1.24)
GFR, Estimated: 51 mL/min — ABNORMAL LOW (ref >60)
Glucose, Bld: 150 mg/dL — ABNORMAL HIGH (ref 70–99)
Potassium: 3.7 mmol/L (ref 3.5–5.1)
Sodium: 140 mmol/L (ref 135–145)

## 2022-01-28 NOTE — Progress Notes (Signed)
Chief Complaint  Patient presents with   Follow-up    4 month f/u   4 month f/u combined CHF EF 35-40% and ? Cardiac amyloid, VT with syncope no further episodes s/p defibrillator doing well and f/u cards appt [facility] 02/05/22 to further f/u on cardiac testing  He wants to know dry weight and will message cards to figure this out    Review of Systems  Constitutional:  Negative for weight loss.  HENT:  Negative for hearing loss.   Eyes:  Negative for blurred vision.  Respiratory:  Negative for shortness of breath.   Cardiovascular:  Negative for chest pain.  Gastrointestinal:  Negative for abdominal pain and blood in stool.  Musculoskeletal:  Negative for back pain.  Skin:  Negative for rash.  Neurological:  Negative for headaches.  Psychiatric/Behavioral:  Negative for depression.    Past Medical History:  Diagnosis Date   Allergy    Arthritis    Atrial flutter ([place])    Carpal tunnel syndrome on left    Chest pain of uncertain etiology 16/04/9603   COVID-19    07/07/19   DDD (degenerative disc disease), thoracolumbar    multilevel   Degenerative joint disease of left shoulder    02/2011    Diverticulosis    left colon (2008)    ED (erectile dysfunction)    02/2011    GERD (gastroesophageal reflux disease)    H. pylori infection    Hyperlipidemia    Hypertension    Insomnia    Lactose intolerance    Low back pain    PVC (premature ventricular contraction) 10/24/2019   Shingles    07/2018   Smoking    smoking since age 66 y.o   Toe fracture, left    4th in 2014   Vitamin D deficiency    Past Surgical History:  Procedure Laterality Date   APPENDECTOMY     BACK SURGERY     x 2 10991 and 1995 in [place]   CARDIOVERSION N/A 10/19/2021   Procedure: CARDIOVERSION;  Surgeon: Werner Lean, MD;  Location: [facility];  Service: Cardiovascular;  Laterality: N/A;   CARPAL TUNNEL RELEASE Left    COLONOSCOPY     2008  diverticulosis   ESOPHAGOGASTRODUODENOSCOPY     h/o +h. pylori   ICD IMPLANT N/A 12/10/2021   Procedure: ICD IMPLANT;  Surgeon: [name], MD;  Location: [facility];  Service: Cardiovascular;  Laterality: N/A;   LEFT HEART CATH AND CORONARY ANGIOGRAPHY N/A 12/08/2021   Procedure: LEFT HEART CATH AND CORONARY ANGIOGRAPHY;  Surgeon: Jettie Booze, MD;  Location: [facility];  Service: Cardiovascular;  Laterality: N/A;   OTHER SURGICAL HISTORY     heart catherization 1999 normal    SPINE SURGERY     x 2    Family History  Problem Relation Age of Onset   Heart disease Mother        age 86    Throat cancer Father 28   Cancer Father        throat    Cancer Maternal Uncle        uncle ?maternal or paternal died colon cancer in his 54s    Diabetes Maternal Aunt    Heart attack Maternal Grandmother    Social History   Socioeconomic History   Marital status: Married    Spouse name: Gwendolyn   Number of children:  2   Years of education: Not on file   Highest education level: High school graduate  Occupational History   Occupation: Retired    Comment: Semi  Tobacco Use   Smoking status: Former    Packs/day: 0.50    Types: Cigarettes    Start date: 07/06/1971    Quit date: 06/26/2020    Years since quitting: 1.5   Smokeless tobacco: Never   Tobacco comments:    Former smoker 09/24/21  Vaping Use   Vaping Use: Never used  Substance and Sexual Activity   Alcohol use: No    Alcohol/week: 0.0 standard drinks of alcohol   Drug use: No   Sexual activity: Not on file  Other Topics Concern   Not on file  Social History Narrative   Married    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    On child    1 son, 1 daughter   Social worker op   Owns guns    Wears seat belt, safe in relationship    Smoker    Retired 06/27/2019   Social Determinants of Health   Financial Resource Strain: [place]  (08/11/2020)   Overall Financial Resource Strain (CARDIA)    Difficulty of Paying Living  Expenses: Not hard at all  Food Insecurity: No [place] (12/11/2021)   Hunger Vital Sign    Worried About Running Out of Food in the Last Year: Never true    [place] in the Last Year: Never true  Transportation Needs: No Transportation Needs (12/11/2021)   PRAPARE - Hydrologist (Medical): No    Lack of Transportation (Non-Medical): No  Physical Activity: Sufficiently Active (08/11/2020)   Exercise Vital Sign    Days of Exercise per Week: 5 days    Minutes of Exercise per Session: 30 min  Stress: No Stress Concern Present (08/11/2020)   [place]    Feeling of Stress : Not at all  Social Connections: Unknown (08/11/2020)   Social Connection and Isolation Panel [NHANES]    Frequency of Communication with Friends and Family: More than three times a week    Frequency of Social Gatherings with Friends and Family: More than three times a week    Attends Religious Services: Not on file    Active Member of Clubs or Organizations: Yes    Attends Archivist Meetings: Not on file    Marital Status: Married  Intimate Partner Violence: Not At Risk (08/11/2020)   Humiliation, Afraid, Rape, and Kick questionnaire    Fear of Current or Ex-Partner: No    Emotionally Abused: No    Physically Abused: No    Sexually Abused: No   Current Meds  Medication Sig   amiodarone (PACERONE) 200 MG tablet Take 1 tablet (200 mg total) by mouth daily.   apixaban (ELIQUIS) 5 MG TABS tablet Take 1 tablet (5 mg total) by mouth 2 (two) times daily.   calcium carbonate (OS-CAL - DOSED IN MG OF ELEMENTAL CALCIUM) 1250 (500 Ca) MG tablet Take 1 tablet (500 mg of elemental calcium total) by mouth daily with breakfast.   carvedilol (COREG) 6.25 MG tablet Take 1 tablet (6.25 mg total) by mouth 2 (two) times daily with a meal.   Cholecalciferol (VITAMIN D-3) 125 MCG (5000 UT) TABS Take 1 tablet by mouth daily.    ezetimibe (ZETIA) 10 MG tablet Take 1 tablet (10 mg total) by mouth daily.   Wilder Glade  10 MG TABS tablet Take 1 tablet (10 mg total) by mouth daily.   ferrous sulfate 325 (65 FE) MG tablet Take 325 mg by mouth daily with breakfast.   folic acid (FOLVITE) 1 MG tablet Take 1 tablet (1 mg total) by mouth daily.   gabapentin (NEURONTIN) 300 MG capsule TAKE 3 CAPSULES(900 MG) BY MOUTH AT BEDTIME   methotrexate (RHEUMATREX) 2.5 MG tablet AS DIRECTED BY HEART FAILURE TEAM. Caution:Chemotherapy. Protect from light.   Multiple Vitamin (MULTIVITAMIN) tablet Take 1 tablet by mouth daily.   pantoprazole (PROTONIX) 40 MG tablet Take 1 tablet (40 mg total) by mouth daily. 30 min before food   predniSONE (DELTASONE) 10 MG tablet AS INSTRUCTED BY HEART FAILURE TEAM   sacubitril-valsartan (ENTRESTO) 24-26 MG Take 1 tablet by mouth 2 (two) times daily.   spironolactone (ALDACTONE) 25 MG tablet Take 0.5 tablets (12.5 mg total) by mouth daily.   sulfamethoxazole-trimethoprim (BACTRIM DS) 800-160 MG tablet Take 1 tablet by mouth every Monday, Wednesday, and Friday.   Allergies  Allergen Reactions   Crestor [Rosuvastatin Calcium] Other (See Comments)    Elevated muscle enzymes    Lipitor [Atorvastatin Calcium] Other (See Comments)    Elevated muscle enzymes    Recent Results (from the past 2160 hour(s))  Basic metabolic panel     Status: None   Collection Time: 12/03/21 11:06 AM  Result Value Ref Range   Glucose 87 70 - 99 mg/dL   BUN 9 8 - 27 mg/dL   Creatinine, Ser 0.91 0.76 - 1.27 mg/dL   eGFR 90 >59 mL/min/1.73   BUN/Creatinine Ratio 10 10 - 24   Sodium 137 134 - 144 mmol/L   Potassium 4.1 3.5 - 5.2 mmol/L   Chloride 99 96 - 106 mmol/L   CO2 22 20 - 29 mmol/L   Calcium 9.4 8.6 - 10.2 mg/dL  CBC     Status: None   Collection Time: 12/03/21 11:06 AM  Result Value Ref Range   WBC 5.8 3.4 - 10.8 x10E3/uL   RBC 4.29 4.14 - 5.80 x10E6/uL   Hemoglobin 13.6 13.0 - 17.7 g/dL   Hematocrit 38.7 37.5 -  51.0 %   MCV 90 79 - 97 fL   MCH 31.7 26.6 - 33.0 pg   MCHC 35.1 31.5 - 35.7 g/dL   RDW 11.6 11.6 - 15.4 %   Platelets 314 150 - 450 x10E3/uL  TSH     Status: None   Collection Time: 12/03/21 11:06 AM  Result Value Ref Range   TSH 1.160 0.450 - 4.500 uIU/mL  Troponin I (High Sensitivity)     Status: Abnormal   Collection Time: 12/07/21  3:56 PM  Result Value Ref Range   Troponin I (High Sensitivity) 39 (H) <18 ng/L    Comment: (NOTE) Elevated high sensitivity troponin I (hsTnI) values and significant  changes across serial measurements may suggest ACS but many other  chronic and acute conditions are known to elevate hsTnI results.  Refer to the "Links" section for chest pain algorithms and additional  guidance. Performed at [facility], [place] [address] [place], [place] [address]   Basic metabolic panel     Status: None   Collection Time: 12/07/21  4:44 PM  Result Value Ref Range   Sodium 141 135 - 145 mmol/L   Potassium 4.0 3.5 - 5.1 mmol/L   Chloride 107 98 - 111 mmol/L   CO2 26 22 - 32 mmol/L   Glucose, Bld 89 70 -  99 mg/dL    Comment: Glucose reference range applies only to samples taken after fasting for at least 8 hours.   BUN 17 8 - 23 mg/dL   Creatinine, Ser 1.18 0.61 - 1.24 mg/dL   Calcium 9.5 8.9 - 10.3 mg/dL   GFR, Estimated >60 >60 mL/min    Comment: (NOTE) Calculated using the CKD-EPI Creatinine Equation (2021)    Anion gap 8 5 - 15    Comment: Performed at [facility] [address], [place], [place] [address]  CBC     Status: Abnormal   Collection Time: 12/07/21  4:44 PM  Result Value Ref Range   WBC 7.8 4.0 - 10.5 K/uL   RBC 4.20 (L) 4.22 - 5.81 MIL/uL   Hemoglobin 13.1 13.0 - 17.0 g/dL   HCT 42.1 39.0 - 52.0 %   MCV 100.2 (H) 80.0 - 100.0 fL   MCH 31.2 26.0 - 34.0 pg   MCHC 31.1 30.0 - 36.0 g/dL   RDW 12.7 11.5 - 15.5 %   Platelets 271 150 - 400 K/uL   nRBC 0.0 0.0 - 0.2 %    Comment: Performed at [facility], [place]  [address] [place], [place] [address]  Troponin I (High Sensitivity)     Status: Abnormal   Collection Time: 12/07/21  4:44 PM  Result Value Ref Range   Troponin I (High Sensitivity) 41 (H) <18 ng/L    Comment: (NOTE) Elevated high sensitivity troponin I (hsTnI) values and significant  changes across serial measurements may suggest ACS but many other  chronic and acute conditions are known to elevate hsTnI results.  Refer to the "Links" section for chest pain algorithms and additional  guidance. Performed at [facility], [place] [address] [place], [place] [address]   Hepatic function panel     Status: None   Collection Time: 12/07/21  4:44 PM  Result Value Ref Range   Total Protein 7.5 6.5 - 8.1 g/dL   Albumin 4.1 3.5 - 5.0 g/dL   AST 38 15 - 41 U/L   ALT 28 0 - 44 U/L   Alkaline Phosphatase 92 38 - 126 U/L   Total Bilirubin 0.6 0.3 - 1.2 mg/dL   Bilirubin, Direct 0.1 0.0 - 0.2 mg/dL   Indirect Bilirubin 0.5 0.3 - 0.9 mg/dL    Comment: Performed at [facility] [address] [place], [place] [address]  Magnesium     Status: None   Collection Time: 12/07/21  4:44 PM  Result Value Ref Range   Magnesium 2.4 1.7 - 2.4 mg/dL    Comment: Performed at [facility] [address] [place], [place] [address]  Basic metabolic panel     Status: Abnormal   Collection Time: 12/08/21  3:18 AM  Result Value Ref Range   Sodium 139 135 - 145 mmol/L   Potassium 3.6 3.5 - 5.1 mmol/L   Chloride 110 98 - 111 mmol/L   CO2 25 22 - 32 mmol/L   Glucose, Bld 102 (H) 70 - 99 mg/dL    Comment: Glucose reference range applies only to samples taken after fasting for at least 8 hours.   BUN 15 8 - 23 mg/dL   Creatinine, Ser 1.06 0.61 - 1.24 mg/dL   Calcium 8.7 (L) 8.9 - 10.3 mg/dL   GFR, Estimated >60 >60 mL/min    Comment: (NOTE) Calculated using the CKD-EPI Creatinine Equation (2021)    Anion gap 4 (  L) 5 - 15    Comment: Performed at [facility], [place] [address],  [place], [place] [address]  CBC     Status: Abnormal   Collection Time: 12/08/21  3:18 AM  Result Value Ref Range   WBC 5.4 4.0 - 10.5 K/uL   RBC 3.77 (L) 4.22 - 5.81 MIL/uL   Hemoglobin 11.4 (L) 13.0 - 17.0 g/dL   HCT 36.8 (L) 39.0 - 52.0 %   MCV 97.6 80.0 - 100.0 fL   MCH 30.2 26.0 - 34.0 pg   MCHC 31.0 30.0 - 36.0 g/dL   RDW 12.7 11.5 - 15.5 %   Platelets 267 150 - 400 K/uL   nRBC 0.0 0.0 - 0.2 %    Comment: Performed at [facility], [place] [address] [place], [place] [address]  CBG monitoring, ED     Status: Abnormal   Collection Time: 12/08/21  5:02 AM  Result Value Ref Range   Glucose-Capillary 103 (H) 70 - 99 mg/dL    Comment: Glucose reference range applies only to samples taken after fasting for at least 8 hours.  ECHOCARDIOGRAM COMPLETE     Status: None   Collection Time: 12/08/21  9:04 AM  Result Value Ref Range   BP 139/95 mmHg   Single Plane A2C EF 46.6 %   Single Plane A4C EF 40.4 %   Calc EF 43.1 %   S' Lateral 4.30 cm   Area-P 1/2 4.10 cm2  CBC     Status: Abnormal   Collection Time: 12/08/21  9:49 AM  Result Value Ref Range   WBC 4.7 4.0 - 10.5 K/uL   RBC 3.92 (L) 4.22 - 5.81 MIL/uL   Hemoglobin 12.0 (L) 13.0 - 17.0 g/dL   HCT 39.0 39.0 - 52.0 %   MCV 99.5 80.0 - 100.0 fL   MCH 30.6 26.0 - 34.0 pg   MCHC 30.8 30.0 - 36.0 g/dL   RDW 12.7 11.5 - 15.5 %   Platelets 270 150 - 400 K/uL   nRBC 0.0 0.0 - 0.2 %    Comment: Performed at [facility], [place] [address], [place], [place] [address]  Creatinine, serum     Status: None   Collection Time: 12/08/21  9:49 AM  Result Value Ref Range   Creatinine, Ser 1.05 0.61 - 1.24 mg/dL   GFR, Estimated >60 >60 mL/min    Comment: (NOTE) Calculated using the CKD-EPI Creatinine Equation (2021) Performed at [facility] [address] [place], [place] [address]   Multiple Myeloma Panel (SPEP&IFE w/QIG)     Status: None   Collection Time: 12/08/21  9:49 AM  Result Value Ref Range   IgG (Immunoglobin G),  Serum 1,107 603 - 1,613 mg/dL   IgA 298 61 - 437 mg/dL   IgM (Immunoglobulin M), Srm 68 15 - 143 mg/dL   Total Protein ELP 6.4 6.0 - 8.5 g/dL   Albumin SerPl Elph-Mcnc 3.6 2.9 - 4.4 g/dL   Alpha 1 0.1 0.0 - 0.4 g/dL   Alpha2 Glob SerPl Elph-Mcnc 0.7 0.4 - 1.0 g/dL   B-Globulin SerPl Elph-Mcnc 0.9 0.7 - 1.3 g/dL   Gamma Glob SerPl Elph-Mcnc 1.0 0.4 - 1.8 g/dL   M Protein SerPl Elph-Mcnc Not Observed Not Observed g/dL   Globulin, Total 2.8 2.2 - 3.9 g/dL   Albumin/Glob SerPl 1.3 0.7 - 1.7   IFE 1 Comment     Comment: (NOTE) The immunofixation pattern appears unremarkable.  Evidence of monoclonal protein is not apparent.    Please Note Comment     Comment: (NOTE) Protein electrophoresis scan will follow via computer, mail, or courier delivery. Performed At: [facility] [place], [place] [id number] Rush Farmer MD [id number]   CBC     Status: Abnormal   Collection Time: 12/09/21  4:54 AM  Result Value Ref Range   WBC 6.1 4.0 - 10.5 K/uL   RBC 3.67 (L) 4.22 - 5.81 MIL/uL   Hemoglobin 11.5 (L) 13.0 - 17.0 g/dL   HCT 35.1 (L) 39.0 - 52.0 %   MCV 95.6 80.0 - 100.0 fL   MCH 31.3 26.0 - 34.0 pg   MCHC 32.8 30.0 - 36.0 g/dL   RDW 12.7 11.5 - 15.5 %   Platelets 229 150 - 400 K/uL   nRBC 0.0 0.0 - 0.2 %    Comment: Performed at [facility], [place] [address] [place], [place] [address]  Basic metabolic panel     Status: Abnormal   Collection Time: 12/09/21  4:54 AM  Result Value Ref Range   Sodium 139 135 - 145 mmol/L   Potassium 3.9 3.5 - 5.1 mmol/L   Chloride 108 98 - 111 mmol/L   CO2 23 22 - 32 mmol/L   Glucose, Bld 97 70 - 99 mg/dL    Comment: Glucose reference range applies only to samples taken after fasting for at least 8 hours.   BUN 12 8 - 23 mg/dL   Creatinine, Ser 0.97 0.61 - 1.24 mg/dL   Calcium 8.2 (L) 8.9 - 10.3 mg/dL   GFR, Estimated >60 >60 mL/min    Comment: (NOTE) Calculated using the CKD-EPI Creatinine Equation (2021)     Anion gap 8 5 - 15    Comment: Performed at [facility] [address], [place], [place] [address]  Lipoprotein A (LPA)     Status: Abnormal   Collection Time: 12/09/21  4:54 AM  Result Value Ref Range   Lipoprotein (a) 46.4 (H) <75.0 nmol/L    Comment: (NOTE) Note:  Values greater than or equal to 75.0 nmol/L may       indicate an independent risk factor for CHD,       but must be evaluated with caution when applied       to non-Caucasian populations due to the       influence of genetic factors on Lp(a) across       ethnicities. Performed At: [facility] [place], [place] [id number] Rush Farmer MD OI:[id number]   Surgical PCR screen     Status: None   Collection Time: 12/09/21 11:10 PM   Specimen: Nasal Mucosa; Nasal Swab  Result Value Ref Range   MRSA, PCR NEGATIVE NEGATIVE   Staphylococcus aureus NEGATIVE NEGATIVE    Comment: (NOTE) The Xpert SA Assay (FDA approved for NASAL specimens in patients 61 years of age and older), is one component of a comprehensive surveillance program. It is not intended to diagnose infection nor to guide or monitor treatment. Performed at [facility], [place] [address], [place],  [address]   Glucose, capillary     Status: Abnormal   Collection Time: 12/11/21  6:07 AM  Result Value Ref Range   Glucose-Capillary 103 (H) 70 - 99 mg/dL    Comment: Glucose reference range applies only to samples taken after fasting for at least 8 hours.   Comment 1 Notify RN  Comment 2 Document in Chart   Basic Metabolic Panel (BMET)     Status: Abnormal   Collection Time: 12/15/21  4:01 PM  Result Value Ref Range   Sodium 139 135 - 145 mmol/L   Potassium 4.5 3.5 - 5.1 mmol/L   Chloride 105 98 - 111 mmol/L   CO2 26 22 - 32 mmol/L   Glucose, Bld 92 70 - 99 mg/dL    Comment: Glucose reference range applies only to samples taken after fasting for at least 8 hours.   BUN 20 8 - 23 mg/dL   Creatinine, Ser 1.33 (H)  0.61 - 1.24 mg/dL   Calcium 9.3 8.9 - 10.3 mg/dL   GFR, Estimated 57 (L) >60 mL/min    Comment: (NOTE) Calculated using the CKD-EPI Creatinine Equation (2021)    Anion gap 8 5 - 15    Comment: Performed at [facility] [address], [place], [place] [address]  B Nat Peptide     Status: None   Collection Time: 12/15/21  4:01 PM  Result Value Ref Range   B Natriuretic Peptide 64.0 0.0 - 100.0 pg/mL    Comment: Performed at [facility] [name] [place], [place] [address]  HgB A1c     Status: None   Collection Time: 12/15/21  4:01 PM  Result Value Ref Range   Hgb A1c MFr Bld 5.4 4.8 - 5.6 %    Comment: (NOTE) Pre diabetes:          5.7%-6.4%  Diabetes:              >6.4%  Glycemic control for   <7.0% adults with diabetes    Mean Plasma Glucose 108.28 mg/dL    Comment: Performed at [facility] [address], [place], [place] [address]  CUP PACEART [facility] DEVICE CHECK     Status: None   Collection Time: 12/24/21 10:05 AM  Result Value Ref Range   Date Time Interrogation Session [id number]    Pulse Generator Manufacturer MERM    Pulse Gen Model [id number] Visia AF MRI VR    Pulse Gen Serial Number [id number]    Clinic Name [facility]    Implantable Pulse Generator Type Implantable Cardiac Defibulator    Implantable Pulse Generator Implant Date 03491791    Implantable Lead Manufacturer [facility]    Implantable Lead Model [id number] Sprint Quattro Secure S MRI SureScan    Implantable Lead Serial Number [id number]    Implantable Lead Implant Date 79480165    Implantable Lead Location Detail 1 SEPTUM    Implantable Lead Location [id number]    Lead Channel Setting Sensing Sensitivity 0.3 mV   Lead Channel Setting Pacing Pulse Width 0.4 ms   Lead Channel Setting Pacing Amplitude 3.5 V   Lead Channel Impedance Value 380 ohm   Lead Channel Impedance Value 285 ohm   Lead Channel Sensing Intrinsic Amplitude 10.5 mV   Lead Channel Pacing Threshold Amplitude 1  V   Lead Channel Pacing Threshold Pulse Width 0.4 ms   HighPow Impedance 66 ohm   Battery Status OK    Battery Remaining Longevity 136 mo   Battery Voltage 3.13 V   Brady Statistic RV Percent Paced 0.73 %   Eval Rhythm VS 63   Basic Metabolic Panel (BMET)     Status: Abnormal   Collection Time: 01/06/22 10:59 AM  Result Value Ref Range   Sodium 139 135 - 145 mmol/L   Potassium 4.1 3.5 -  5.1 mmol/L   Chloride 110 98 - 111 mmol/L   CO2 23 22 - 32 mmol/L   Glucose, Bld 103 (H) 70 - 99 mg/dL    Comment: Glucose reference range applies only to samples taken after fasting for at least 8 hours.   BUN 13 8 - 23 mg/dL   Creatinine, Ser 1.47 (H) 0.61 - 1.24 mg/dL   Calcium 8.7 (L) 8.9 - 10.3 mg/dL   GFR, Estimated 50 (L) >60 mL/min    Comment: (NOTE) Calculated using the CKD-EPI Creatinine Equation (2021)    Anion gap 6 5 - 15    Comment: Performed at [facility] [address] [place], [place] [address]  CBC w/Diff/Platelet     Status: Abnormal   Collection Time: 01/06/22 10:59 AM  Result Value Ref Range   WBC 8.8 4.0 - 10.5 K/uL   RBC 3.93 (L) 4.22 - 5.81 MIL/uL   Hemoglobin 12.1 (L) 13.0 - 17.0 g/dL   HCT 38.7 (L) 39.0 - 52.0 %   MCV 98.5 80.0 - 100.0 fL   MCH 30.8 26.0 - 34.0 pg   MCHC 31.3 30.0 - 36.0 g/dL   RDW 14.1 11.5 - 15.5 %   Platelets 239 150 - 400 K/uL   nRBC 0.0 0.0 - 0.2 %   Neutrophils Relative % 80 %   Neutro Abs 7.1 1.7 - 7.7 K/uL   Lymphocytes Relative 13 %   Lymphs Abs 1.1 0.7 - 4.0 K/uL   Monocytes Relative 6 %   Monocytes Absolute 0.5 0.1 - 1.0 K/uL   Eosinophils Relative 1 %   Eosinophils Absolute 0.1 0.0 - 0.5 K/uL   Basophils Relative 0 %   Basophils Absolute 0.0 0.0 - 0.1 K/uL   Immature Granulocytes 0 %   Abs Immature Granulocytes 0.03 0.00 - 0.07 K/uL    Comment: Performed at [facility] [address], [place], [place] [address]  CBC     Status: Abnormal   Collection Time: 01/18/22 12:08 PM  Result Value Ref Range   WBC 7.5  4.0 - 10.5 K/uL   RBC 4.07 (L) 4.22 - 5.81 MIL/uL   Hemoglobin 12.8 (L) 13.0 - 17.0 g/dL   HCT 40.2 39.0 - 52.0 %   MCV 98.8 80.0 - 100.0 fL   MCH 31.4 26.0 - 34.0 pg   MCHC 31.8 30.0 - 36.0 g/dL   RDW 14.7 11.5 - 15.5 %   Platelets 263 150 - 400 K/uL   nRBC 0.0 0.0 - 0.2 %    Comment: Performed at [facility], [place] [address] [place], [place] [address]  Comprehensive Metabolic Panel (CMET)     Status: Abnormal   Collection Time: 01/18/22 12:08 PM  Result Value Ref Range   Sodium 138 135 - 145 mmol/L   Potassium 4.2 3.5 - 5.1 mmol/L   Chloride 101 98 - 111 mmol/L   CO2 24 22 - 32 mmol/L   Glucose, Bld 108 (H) 70 - 99 mg/dL    Comment: Glucose reference range applies only to samples taken after fasting for at least 8 hours.   BUN 19 8 - 23 mg/dL   Creatinine, Ser 1.24 0.61 - 1.24 mg/dL   Calcium 9.3 8.9 - 10.3 mg/dL   Total Protein 6.7 6.5 - 8.1 g/dL   Albumin 3.7 3.5 - 5.0 g/dL   AST 31 15 - 41 U/L   ALT 43 0 - 44 U/L   Alkaline Phosphatase 73  38 - 126 U/L   Total Bilirubin 0.5 0.3 - 1.2 mg/dL   GFR, Estimated >60 >60 mL/min    Comment: (NOTE) Calculated using the CKD-EPI Creatinine Equation (2021)    Anion gap 13 5 - 15    Comment: Performed at [facility] [address], [place], [place] [address]  B Nat Peptide     Status: None   Collection Time: 01/18/22 12:08 PM  Result Value Ref Range   B Natriuretic Peptide 33.5 0.0 - 100.0 pg/mL    Comment: Performed at [facility] [address] [place], [place] [address]  Basic Metabolic Panel (BMET)     Status: Abnormal   Collection Time: 01/28/22  9:01 AM  Result Value Ref Range   Sodium 140 135 - 145 mmol/L   Potassium 3.7 3.5 - 5.1 mmol/L   Chloride 105 98 - 111 mmol/L   CO2 26 22 - 32 mmol/L   Glucose, Bld 150 (H) 70 - 99 mg/dL    Comment: Glucose reference range applies only to samples taken after fasting for at least 8 hours.   BUN 14 8 - 23 mg/dL   Creatinine, Ser 1.45 (H) 0.61 - 1.24 mg/dL    Calcium 9.1 8.9 - 10.3 mg/dL   GFR, Estimated 51 (L) >60 mL/min    Comment: (NOTE) Calculated using the CKD-EPI Creatinine Equation (2021)    Anion gap 9 5 - 15    Comment: Performed at [facility] [address], [place], [place] [address]   Objective  Body mass index is 26.85 kg/m. Wt Readings from Last 3 Encounters:  01/28/22 250 lb 9.6 oz (113.7 kg)  01/27/22 244 lb (110.7 kg)  01/18/22 248 lb 9.6 oz (112.8 kg)   Temp Readings from Last 3 Encounters:  01/28/22 98.5 F (36.9 C) (Oral)  12/15/21 98 F (36.7 C) (Oral)  12/11/21 97.8 F (36.6 C) (Oral)   BP Readings from Last 3 Encounters:  01/28/22 122/60  01/27/22 110/62  01/18/22 110/60   Pulse Readings from Last 3 Encounters:  01/28/22 86  01/27/22 62  01/18/22 (!) 56    Physical Exam Vitals and nursing note reviewed.  Constitutional:      Appearance: Normal appearance. He is well-developed and well-groomed.  HENT:     Head: Normocephalic and atraumatic.  Eyes:     Conjunctiva/sclera: Conjunctivae normal.     Pupils: Pupils are equal, round, and reactive to light.  Cardiovascular:     Rate and Rhythm: Normal rate and regular rhythm.     Heart sounds: Normal heart sounds.  Pulmonary:     Effort: Pulmonary effort is normal. No respiratory distress.     Breath sounds: Normal breath sounds.  Abdominal:     Tenderness: There is no abdominal tenderness.  Skin:    General: Skin is warm and moist.  Neurological:     General: No focal deficit present.     Mental Status: He is alert and oriented to person, place, and time. Mental status is at baseline.     Sensory: Sensation is intact.     Motor: Motor function is intact.     Coordination: Coordination is intact.     Gait: Gait is intact. Gait normal.  Psychiatric:        Attention and Perception: Attention and perception normal.        Mood and Affect: Mood and affect normal.        Speech: Speech normal.  Behavior: Behavior normal.  Behavior is cooperative.        Thought Content: Thought content normal.        Cognition and Memory: Cognition and memory normal.        Judgment: Judgment normal.     Assessment  Plan  Chronic combined systolic and diastolic CHF (congestive heart failure) (HCC)  Ventricular tachycardia (HCC) no further episodes  Figure out dry weight  Amiodarone 200  Eliquis 5 mg qid  Coreg 6.25 mg bid Farxiga 10 mg qd  Entreso 24-26 mg bid  Spironlactone 12.5 mg qd   HM Flu shot utd prevnar and pna 23 utd 06/03/16 and 02/16/15  Tdap had 06/15/13  shingrix sent to pharmacy h/o shingles had 1/2 check on 2nd dose not had consider restart series in the future covid vx 4/4   Hep B immune  PSA 1.35 05/15/21 Hep C neg 06/03/16  D/c MMR check 2/2 cost  a1c 5.6 02/26/21    Colonoscopy 12/17/16 Dr. Moshe Salisbury  -had 12/17/16 rec repeat in 10 years  Referred today see above   EGD 11/15/19 prob healing ulcer in duodenum, mild deformity antrum likely from old ulcer disease bx negative, hiatal hernia Dr. Lyda Jester    Tobacco abuse long term-rec smoking cessation 1/2 ppd since age 11 y.o max 1 ppd quit 06/26/20 ? 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     Electronically Signed   By: Lavonia Dana M.D.   On: 12/10/2021 14:28  Cardiology  -[place] [facility] Provider: Dr. Olivia Mackie [name]-Internal Medicine

## 2022-01-28 NOTE — Patient Instructions (Addendum)
Dr. 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    Per cardiology START Spironolactone 12.5 mg Daily                          Lab work done today we will call you with any abnormal results         Follow up lab work in 10 days                           Your physician recommends that you schedule a follow-up appointment in: 2 months with app clinic                If you have any questions or concerns before your next appointment please send [place] a message through [place] or call our office at [phone].     TO LEAVE A MESSAGE FOR THE NURSE SELECT OPTION 2, PLEASE LEAVE A MESSAGE INCLUDING: YOUR NAME DATE OF BIRTH CALL BACK NUMBER REASON FOR CALL**this is important as we prioritize the call backs   YOU WILL RECEIVE A CALL BACK THE SAME DAY AS LONG AS YOU CALL BEFORE 4:00 PM   At the [facility], you and your health needs are our priority. As part of our continuing mission to provide you with exceptional heart care, we have created designated Provider Care Teams. These Care Teams include your primary Cardiologist (physician) and Advanced Practice Providers (APPs- Physician Assistants and Nurse Practitioners) who all work together to provide you with the care you need, when you need it.    You may see any of the following providers on your designated Care Team at your next follow up: Dr Arvilla Meres Dr Carron Curie, NP Robbie Lis, [place] [facility], [place] Karle Plumber, PharmD     Please be sure to bring in all your medications bottles to every appointment.

## 2022-02-02 ENCOUNTER — Telehealth (HOSPITAL_COMMUNITY): Payer: Self-pay

## 2022-02-02 NOTE — Telephone Encounter (Signed)
Spoke to [facility] Radiology Department staff to confirm Mr. Barrales's appointment for 8/4 at 11:45. He is to report to [address] [facility] [address] to the Radiology Department on the 1st floor. I will make sure he and his wife are aware of this and give them written instructions for directions.   Call complete.   [name], EMT-Paramedic [phone] 02/02/2022

## 2022-02-03 ENCOUNTER — Other Ambulatory Visit (HOSPITAL_COMMUNITY): Payer: Self-pay

## 2022-02-03 NOTE — Progress Notes (Signed)
Paramedicine Encounter    Patient ID: John Keith, male    DOB: [birth year], 71 y.o.   MRN: [id number]   Arrived for home visit for John Keith who reports feeling good with no complaints. He denied shortness of breath, swelling, chest pain or dizziness. John Keith was 100% compliant with his medications over the last week. He reports no symptoms or side effects over the last week.   I obtained assessment and vitals:  WT- 242.8lbs BP- 140/82 HR- 60 RR-18 O2-96%  No swelling noted.  Lungs clear on assessment.  I reviewed meds and confirmed same filling pill box for one week.  No refills needed.  We reviewed upcoming appointments and confirmed [facility] appointment for Friday and directions to same.   Home visit completed, next visit planned in one week.   No social concerns at this time.    [name], EMT-Paramedic [phone] 02/03/2022   Patient Care Team: [name], Pasty Spillers, MD as PCP - General (Internal Medicine) Chilton Si, MD as PCP - Cardiology (Cardiology)  Patient Active Problem List   Diagnosis Date Noted   Chronic systolic heart failure (HCC) 12/15/2021   Chronic combined systolic and diastolic CHF (congestive heart failure) (HCC) 12/15/2021   Nasal sore 12/15/2021   Syncope 12/07/2021   Ventricular tachycardia (HCC) 12/07/2021   Typical atrial flutter (HCC) 09/24/2021   Secondary hypercoagulable state (HCC) 09/24/2021   Abnormal x-ray of neck 06/16/2021   Abnormal MRI, lumbar spine 10/23/2020   Chronic low back pain 10/23/2020   Neuropathy 10/23/2020   Coronary artery disease of native artery of native heart with stable angina pectoris (HCC) 10/23/2020   Impingement syndrome of left shoulder region 08/22/2020   Pain in joint of left shoulder 07/24/2020   Chronic left shoulder pain 07/11/2020   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   Anemia 12/13/2019   Chest pain of uncertain etiology 10/24/2019   PVC (premature ventricular contraction) 10/24/2019    Gastric ulcer without hemorrhage or perforation 09/28/2019   COVID-19 virus detected 07/12/2019   Elevated troponin 07/07/2019   Elevated serum protein level 07/07/2019   Postherpetic neuralgia 08/17/2018   Benign prostatic hyperplasia 08/01/2018   Hip pain 06/29/2018   Aortic atherosclerosis (HCC) 09/22/2016   Fatigue 06/03/2016   Lower urinary tract symptoms (LUTS) 06/03/2016   Constipation 03/10/2016   Lumbar radiculopathy 02/20/2016   Encounter for immunization 11/26/2015   Insomnia 06/10/2015   External hemorrhoid 03/07/2015   Health care maintenance 01/09/2014   Carpal tunnel syndrome of left wrist 01/03/2013   Hyperlipidemia 11/09/2012   Vitamin D deficiency 11/09/2012   Essential hypertension 11/06/2012   Tobacco abuse 11/06/2012   Seasonal allergies 11/06/2012   Erectile dysfunction 11/06/2012   GERD (gastroesophageal reflux disease) 11/06/2012   DDD (degenerative disc disease), thoracolumbar 11/06/2012   Lactose intolerance 11/06/2012   Diverticulosis of colon without hemorrhage 11/06/2012    Current Outpatient Medications:    amiodarone (PACERONE) 200 MG tablet, Take 1 tablet (200 mg total) by mouth daily., Disp: 30 tablet, Rfl: 5   apixaban (ELIQUIS) 5 MG TABS tablet, Take 1 tablet (5 mg total) by mouth 2 (two) times daily., Disp: 60 tablet, Rfl: 3   calcium carbonate (OS-CAL - DOSED IN MG OF ELEMENTAL CALCIUM) 1250 (500 Ca) MG tablet, Take 1 tablet (500 mg of elemental calcium total) by mouth daily with breakfast., Disp: 30 tablet, Rfl: 11   carvedilol (COREG) 6.25 MG tablet, Take 1 tablet (6.25 mg total) by mouth 2 (two) times daily with a meal., Disp: 60 tablet,  Rfl: 5   Cholecalciferol (VITAMIN D-3) 125 MCG (5000 UT) TABS, Take 1 tablet by mouth daily., Disp: 30 tablet, Rfl: 11   ezetimibe (ZETIA) 10 MG tablet, Take 1 tablet (10 mg total) by mouth daily., Disp: 90 tablet, Rfl: 3   FARXIGA 10 MG TABS tablet, Take 1 tablet (10 mg total) by mouth daily., Disp: 90  tablet, Rfl: 3   ferrous sulfate 325 (65 FE) MG tablet, Take 325 mg by mouth daily with breakfast., Disp: , Rfl:    folic acid (FOLVITE) 1 MG tablet, Take 1 tablet (1 mg total) by mouth daily., Disp: 30 tablet, Rfl: 11   gabapentin (NEURONTIN) 300 MG capsule, TAKE 3 CAPSULES(900 MG) BY MOUTH AT BEDTIME, Disp: 180 capsule, Rfl: 3   methotrexate (RHEUMATREX) 2.5 MG tablet, AS DIRECTED BY HEART FAILURE TEAM.PROTECT FROM LIGHT AS DIRECTED, Disp: 44 tablet, Rfl: 3   Multiple Vitamin (MULTIVITAMIN) tablet, Take 1 tablet by mouth daily., Disp: , Rfl:    pantoprazole (PROTONIX) 40 MG tablet, Take 1 tablet (40 mg total) by mouth daily. 30 min before food, Disp: 90 tablet, Rfl: 3   predniSONE (DELTASONE) 10 MG tablet, AS INSTRUCTED BY HEART FAILURE TEAM, Disp: 252 tablet, Rfl: 0   sacubitril-valsartan (ENTRESTO) 24-26 MG, Take 1 tablet by mouth 2 (two) times daily., Disp: 180 tablet, Rfl: 3   spironolactone (ALDACTONE) 25 MG tablet, Take 0.5 tablets (12.5 mg total) by mouth daily., Disp: 15 tablet, Rfl: 2   sulfamethoxazole-trimethoprim (BACTRIM DS) 800-160 MG tablet, Take 1 tablet by mouth every Monday, Wednesday, and Friday., Disp: 12 tablet, Rfl: 3 Allergies  Allergen Reactions   Crestor [Rosuvastatin Calcium] Other (See Comments)    Elevated muscle enzymes    Lipitor [Atorvastatin Calcium] Other (See Comments)    Elevated muscle enzymes      Social History   Socioeconomic History   Marital status: Married    Spouse name: Proofreader   Number of children: 2   Years of education: Not on file   Highest education level: High school graduate  Occupational History   Occupation: Retired    Comment: Semi  Tobacco Use   Smoking status: Former    Packs/day: 0.50    Types: Cigarettes    Start date: 07/06/1971    Quit date: 06/26/2020    Years since quitting: 1.6   Smokeless tobacco: Never   Tobacco comments:    Former smoker 09/24/21  Vaping Use   Vaping Use: Never used  Substance and Sexual  Activity   Alcohol use: No    Alcohol/week: 0.0 standard drinks of alcohol   Drug use: No   Sexual activity: Not on file  Other Topics Concern   Not on file  Social History Narrative   Married    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    On child    1 son, 1 daughter   Systems analyst op   Owns guns    Wears seat belt, safe in relationship    Smoker    Retired 06/27/2019   Social Determinants of Health   Financial Resource Strain: Low Risk  (08/11/2020)   Overall Financial Resource Strain (CARDIA)    Difficulty of Paying Living Expenses: Not hard at all  Food Insecurity: No Food Insecurity (12/11/2021)   Hunger Vital Sign    Worried About Running Out of Food in the Last Year: Never true    Ran Out of Food in the Last Year: Never true  Transportation Needs: No Transportation Needs (12/11/2021)   PRAPARE -  Administrator, Civil Service (Medical): No    Lack of Transportation (Non-Medical): No  Physical Activity: Sufficiently Active (08/11/2020)   Exercise Vital Sign    Days of Exercise per Week: 5 days    Minutes of Exercise per Session: 30 min  Stress: No Stress Concern Present (08/11/2020)   [employer] of Occupational Health - Occupational Stress Questionnaire    Feeling of Stress : Not at all  Social Connections: Unknown (08/11/2020)   Social Connection and Isolation Panel [NHANES]    Frequency of Communication with Friends and Family: More than three times a week    Frequency of Social Gatherings with Friends and Family: More than three times a week    Attends Religious Services: Not on file    Active Member of Clubs or Organizations: Yes    Attends Banker Meetings: Not on file    Marital Status: Married  Intimate Partner Violence: Not At Risk (08/11/2020)   Humiliation, Afraid, Rape, and Kick questionnaire    Fear of Current or Ex-Partner: No    Emotionally Abused: No    Physically Abused: No    Sexually Abused: No    Physical Exam      Future Appointments  Date Time  Provider Department Center  03/12/2022  7:00 AM [facility] DEVICE REMOTES [facility]  03/16/2022  3:00 PM Regan Lemming, MD [facility]  03/22/2022  8:30 AM [facility] PA/NP [facility] None  04/08/2022  9:30 AM Freddie Breech, DPM [facility]  05/06/2022 11:00 AM Dana Allan, MD [facility]  06/11/2022  7:00 AM [facility] DEVICE REMOTES [facility]  09/10/2022  7:00 AM [facility] DEVICE REMOTES [facility]  12/10/2022  7:00 AM [facility] DEVICE REMOTES [facility]  03/11/2023  7:00 AM [facility] DEVICE REMOTES [facility]     ACTION: Home visit completed

## 2022-02-04 NOTE — Progress Notes (Signed)
Per cardiology your dry/normal weight should be 242 to 246 lbs Inform pt  If gaining more than 5 lbs then this would be concerning and having leg swelling, sob

## 2022-02-05 DIAGNOSIS — I2510 Atherosclerotic heart disease of native coronary artery without angina pectoris: Secondary | ICD-10-CM | POA: Diagnosis not present

## 2022-02-05 DIAGNOSIS — R9439 Abnormal result of other cardiovascular function study: Secondary | ICD-10-CM | POA: Diagnosis not present

## 2022-02-05 DIAGNOSIS — E785 Hyperlipidemia, unspecified: Secondary | ICD-10-CM | POA: Diagnosis not present

## 2022-02-05 DIAGNOSIS — D8685 Sarcoid myocarditis: Secondary | ICD-10-CM | POA: Diagnosis not present

## 2022-02-05 DIAGNOSIS — I10 Essential (primary) hypertension: Secondary | ICD-10-CM | POA: Diagnosis not present

## 2022-02-10 ENCOUNTER — Other Ambulatory Visit (HOSPITAL_COMMUNITY): Payer: Self-pay

## 2022-02-10 NOTE — Progress Notes (Signed)
Paramedicine Encounter    Patient ID: John Keith, male    DOB: [birth year], 71 y.o.   MRN: [id number]  Arrived for home visit for Giulian who reports feeling good with no complaints of shortness of breath, dizziness or chest pain. He denied any recent swelling.   Sher has been 100% compliant with his medications over the last week.   Styles went to [facility] for his Cardiac MRI last week.   Kito does report some mild insomnia over the last week or two- I suggested a low dose melatonin which he says he has used in the past and this helped.   We reviewed medications and I filled pill box for one week.   Vitals are as noted:  WT- 243lbs BP- 113/62 HR- 60 RR- 16 O2- 97%  No lower leg swelling. Compression socks on with proper fitting.   Lungs clear.   HF management discussed with diet, exercise and continued med compliance. He agreed with same.  Refills: Amiodarone (called into [facility])  I plan to see Fredi in one week- he knows to reach out if needed in the mean time.    [name], EMT-Paramedic [phone] 02/10/2022    Patient Care Team: [name], Pasty Spillers, MD as PCP - General (Internal Medicine) Chilton Si, MD as PCP - Cardiology (Cardiology)  Patient Active Problem List   Diagnosis Date Noted   Chronic systolic heart failure (HCC) 12/15/2021   Chronic combined systolic and diastolic CHF (congestive heart failure) (HCC) 12/15/2021   Nasal sore 12/15/2021   Syncope 12/07/2021   Ventricular tachycardia (HCC) 12/07/2021   Typical atrial flutter (HCC) 09/24/2021   Secondary hypercoagulable state (HCC) 09/24/2021   Abnormal x-ray of neck 06/16/2021   Abnormal MRI, lumbar spine 10/23/2020   Chronic low back pain 10/23/2020   Neuropathy 10/23/2020   Coronary artery disease of native artery of native heart with stable angina pectoris (HCC) 10/23/2020   Impingement syndrome of left shoulder region 08/22/2020   Pain in joint of left shoulder 07/24/2020    Chronic left shoulder pain 07/11/2020   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   Anemia 12/13/2019   Chest pain of uncertain etiology 10/24/2019   PVC (premature ventricular contraction) 10/24/2019   Gastric ulcer without hemorrhage or perforation 09/28/2019   COVID-19 virus detected 07/12/2019   Elevated troponin 07/07/2019   Elevated serum protein level 07/07/2019   Postherpetic neuralgia 08/17/2018   Benign prostatic hyperplasia 08/01/2018   Hip pain 06/29/2018   Aortic atherosclerosis (HCC) 09/22/2016   Fatigue 06/03/2016   Lower urinary tract symptoms (LUTS) 06/03/2016   Constipation 03/10/2016   Lumbar radiculopathy 02/20/2016   Encounter for immunization 11/26/2015   Insomnia 06/10/2015   External hemorrhoid 03/07/2015   Health care maintenance 01/09/2014   Carpal tunnel syndrome of left wrist 01/03/2013   Hyperlipidemia 11/09/2012   Vitamin D deficiency 11/09/2012   Essential hypertension 11/06/2012   Tobacco abuse 11/06/2012   Seasonal allergies 11/06/2012   Erectile dysfunction 11/06/2012   GERD (gastroesophageal reflux disease) 11/06/2012   DDD (degenerative disc disease), thoracolumbar 11/06/2012   Lactose intolerance 11/06/2012   Diverticulosis of colon without hemorrhage 11/06/2012    Current Outpatient Medications:    amiodarone (PACERONE) 200 MG tablet, Take 1 tablet (200 mg total) by mouth daily., Disp: 30 tablet, Rfl: 5   apixaban (ELIQUIS) 5 MG TABS tablet, Take 1 tablet (5 mg total) by mouth 2 (two) times daily., Disp: 60 tablet, Rfl: 3   calcium carbonate (OS-CAL - DOSED IN MG OF ELEMENTAL  CALCIUM) 1250 (500 Ca) MG tablet, Take 1 tablet (500 mg of elemental calcium total) by mouth daily with breakfast., Disp: 30 tablet, Rfl: 11   carvedilol (COREG) 6.25 MG tablet, Take 1 tablet (6.25 mg total) by mouth 2 (two) times daily with a meal., Disp: 60 tablet, Rfl: 5   Cholecalciferol (VITAMIN D-3) 125 MCG (5000 UT) TABS, Take 1 tablet by mouth daily., Disp: 30 tablet,  Rfl: 11   ezetimibe (ZETIA) 10 MG tablet, Take 1 tablet (10 mg total) by mouth daily., Disp: 90 tablet, Rfl: 3   FARXIGA 10 MG TABS tablet, Take 1 tablet (10 mg total) by mouth daily., Disp: 90 tablet, Rfl: 3   ferrous sulfate 325 (65 FE) MG tablet, Take 325 mg by mouth daily with breakfast., Disp: , Rfl:    folic acid (FOLVITE) 1 MG tablet, Take 1 tablet (1 mg total) by mouth daily., Disp: 30 tablet, Rfl: 11   gabapentin (NEURONTIN) 300 MG capsule, TAKE 3 CAPSULES(900 MG) BY MOUTH AT BEDTIME, Disp: 180 capsule, Rfl: 3   methotrexate (RHEUMATREX) 2.5 MG tablet, AS DIRECTED BY HEART FAILURE TEAM.PROTECT FROM LIGHT AS DIRECTED, Disp: 44 tablet, Rfl: 3   Multiple Vitamin (MULTIVITAMIN) tablet, Take 1 tablet by mouth daily., Disp: , Rfl:    pantoprazole (PROTONIX) 40 MG tablet, Take 1 tablet (40 mg total) by mouth daily. 30 min before food, Disp: 90 tablet, Rfl: 3   predniSONE (DELTASONE) 10 MG tablet, AS INSTRUCTED BY HEART FAILURE TEAM, Disp: 252 tablet, Rfl: 0   sacubitril-valsartan (ENTRESTO) 24-26 MG, Take 1 tablet by mouth 2 (two) times daily., Disp: 180 tablet, Rfl: 3   spironolactone (ALDACTONE) 25 MG tablet, Take 0.5 tablets (12.5 mg total) by mouth daily., Disp: 15 tablet, Rfl: 2   sulfamethoxazole-trimethoprim (BACTRIM DS) 800-160 MG tablet, Take 1 tablet by mouth every Monday, Wednesday, and Friday., Disp: 12 tablet, Rfl: 3 Allergies  Allergen Reactions   Crestor [Rosuvastatin Calcium] Other (See Comments)    Elevated muscle enzymes    Lipitor [Atorvastatin Calcium] Other (See Comments)    Elevated muscle enzymes      Social History   Socioeconomic History   Marital status: Married    Spouse name: Proofreader   Number of children: 2   Years of education: Not on file   Highest education level: High school graduate  Occupational History   Occupation: Retired    Comment: Semi  Tobacco Use   Smoking status: Former    Packs/day: 0.50    Types: Cigarettes    Start date:  07/06/1971    Quit date: 06/26/2020    Years since quitting: 1.6   Smokeless tobacco: Never   Tobacco comments:    Former smoker 09/24/21  Vaping Use   Vaping Use: Never used  Substance and Sexual Activity   Alcohol use: No    Alcohol/week: 0.0 standard drinks of alcohol   Drug use: No   Sexual activity: Not on file  Other Topics Concern   Not on file  Social History Narrative   Married    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    On child    1 son, 1 daughter   Machine op   Owns guns    Wears seat belt, safe in relationship    Smoker    Retired 06/27/2019   Social Determinants of Health   Financial Resource Strain: Low Risk  (08/11/2020)   Overall Financial Resource Strain (CARDIA)    Difficulty of Paying Living Expenses: Not hard at all  Food Insecurity: No Food Insecurity (12/11/2021)   Hunger Vital Sign    Worried About Running Out of Food in the Last Year: Never true    Ran Out of Food in the Last Year: Never true  Transportation Needs: No Transportation Needs (12/11/2021)   PRAPARE - Hydrologist (Medical): No    Lack of Transportation (Non-Medical): No  Physical Activity: Sufficiently Active (08/11/2020)   Exercise Vital Sign    Days of Exercise per Week: 5 days    Minutes of Exercise per Session: 30 min  Stress: No Stress Concern Present (08/11/2020)   [place]    Feeling of Stress : Not at all  Social Connections: Unknown (08/11/2020)   Social Connection and Isolation Panel [NHANES]    Frequency of Communication with Friends and Family: More than three times a week    Frequency of Social Gatherings with Friends and Family: More than three times a week    Attends Religious Services: Not on file    Active Member of Clubs or Organizations: Yes    Attends Archivist Meetings: Not on file    Marital Status: Married  Intimate Partner Violence: Not At Risk (08/11/2020)   Humiliation, Afraid,  Rape, and Kick questionnaire    Fear of Current or Ex-Partner: No    Emotionally Abused: No    Physically Abused: No    Sexually Abused: No    Physical Exam      Future Appointments  Date Time Provider [place]  03/12/2022  7:00 AM [facility] DEVICE REMOTES [facility]  03/16/2022  3:00 PM [name], MD [facility]  03/22/2022  8:30 AM [facility] PA/NP [facility] None  04/08/2022  9:30 AM Marzetta Board, DPM [facility]  05/06/2022 11:00 AM Carollee Leitz, MD [facility]  06/11/2022  7:00 AM [facility] DEVICE REMOTES [facility]  09/10/2022  7:00 AM [facility] DEVICE REMOTES [facility]  12/10/2022  7:00 AM [facility] DEVICE REMOTES [facility]  03/11/2023  7:00 AM [facility] DEVICE [facility]OTES [facility]     ACTION: Home visit completed

## 2022-02-17 ENCOUNTER — Other Ambulatory Visit (HOSPITAL_COMMUNITY): Payer: Self-pay

## 2022-02-17 NOTE — Progress Notes (Signed)
Paramedicine Encounter    Patient ID: John Keith, male    DOB: Dec 29, 1950, 71 y.o.   MRN: [id number]  Arrived for home visit for Tou who reports he is feeling good but admits he had a bout of chest pain earlier that he describes as acid reflux. He said this happened after eating a ham biscuit and he says he ate really quickly and this isn't abnormal for him when this happens. He says he feels better now.   He denied dizziness, swelling or weight gain.He denied shortness of breath also.   I obtained vitals: WT- 243lbs BP- 110/64 HR- 59 RR- 16 O2- 95%  He was 100% compliant with meds over the last week. Pill box filled for one week.   Refills: Zetia  He reports he feels his gabapentin is not helping his sleeping as he says he is waking up every night to urinate around 230am and says he is unable to fall back asleep and stays up for several hours after. I reccomeneded using melatonin or talking to his PCP about this. He agreed.  Overall Janis reports feeling well, he agreed to our home visit in one week. He and I reviewed appointments. Home visit complete.   [name], EMT-Paramedic [phone] 02/17/2022      Patient Care Team: [name], Pasty Spillers, MD as PCP - General (Internal Medicine) Chilton Si, MD as PCP - Cardiology (Cardiology)  Patient Active Problem List   Diagnosis Date Noted   Chronic systolic heart failure (HCC) 12/15/2021   Chronic combined systolic and diastolic CHF (congestive heart failure) (HCC) 12/15/2021   Nasal sore 12/15/2021   Syncope 12/07/2021   Ventricular tachycardia (HCC) 12/07/2021   Typical atrial flutter (HCC) 09/24/2021   Secondary hypercoagulable state (HCC) 09/24/2021   Abnormal x-ray of neck 06/16/2021   Abnormal MRI, lumbar spine 10/23/2020   Chronic low back pain 10/23/2020   Neuropathy 10/23/2020   Coronary artery disease of native artery of native heart with stable angina pectoris (HCC) 10/23/2020    Impingement syndrome of left shoulder region 08/22/2020   Pain in joint of left shoulder 07/24/2020   Chronic left shoulder pain 07/11/2020   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   Anemia 12/13/2019   Chest pain of uncertain etiology 10/24/2019   PVC (premature ventricular contraction) 10/24/2019   Gastric ulcer without hemorrhage or perforation 09/28/2019   COVID-19 virus detected 07/12/2019   Elevated troponin 07/07/2019   Elevated serum protein level 07/07/2019   Postherpetic neuralgia 08/17/2018   Benign prostatic hyperplasia 08/01/2018   Hip pain 06/29/2018   Aortic atherosclerosis (HCC) 09/22/2016   Fatigue 06/03/2016   Lower urinary tract symptoms (LUTS) 06/03/2016   Constipation 03/10/2016   Lumbar radiculopathy 02/20/2016   Encounter for immunization 11/26/2015   Insomnia 06/10/2015   External hemorrhoid 03/07/2015   Health care maintenance 01/09/2014   Carpal tunnel syndrome of left wrist 01/03/2013   Hyperlipidemia 11/09/2012   Vitamin D deficiency 11/09/2012   Essential hypertension 11/06/2012   Tobacco abuse 11/06/2012   Seasonal allergies 11/06/2012   Erectile dysfunction 11/06/2012   GERD (gastroesophageal reflux disease) 11/06/2012   DDD (degenerative disc disease), thoracolumbar 11/06/2012   Lactose intolerance 11/06/2012   Diverticulosis of colon without hemorrhage 11/06/2012    Current Outpatient Medications:    amiodarone (PACERONE) 200 MG tablet, Take 1 tablet (200 mg total) by mouth daily., Disp: 30 tablet, Rfl: 5   apixaban (ELIQUIS) 5 MG TABS tablet, Take 1 tablet (5 mg total) by mouth 2 (two) times daily.,  Disp: 60 tablet, Rfl: 3   calcium carbonate (OS-CAL - DOSED IN MG OF ELEMENTAL CALCIUM) 1250 (500 Ca) MG tablet, Take 1 tablet (500 mg of elemental calcium total) by mouth daily with breakfast., Disp: 30 tablet, Rfl: 11   carvedilol (COREG) 6.25 MG tablet, Take 1 tablet (6.25 mg total) by mouth 2 (two) times daily with a meal., Disp: 60 tablet, Rfl: 5    Cholecalciferol (VITAMIN D-3) 125 MCG (5000 UT) TABS, Take 1 tablet by mouth daily., Disp: 30 tablet, Rfl: 11   ezetimibe (ZETIA) 10 MG tablet, Take 1 tablet (10 mg total) by mouth daily., Disp: 90 tablet, Rfl: 3   FARXIGA 10 MG TABS tablet, Take 1 tablet (10 mg total) by mouth daily., Disp: 90 tablet, Rfl: 3   ferrous sulfate 325 (65 FE) MG tablet, Take 325 mg by mouth daily with breakfast., Disp: , Rfl:    folic acid (FOLVITE) 1 MG tablet, Take 1 tablet (1 mg total) by mouth daily., Disp: 30 tablet, Rfl: 11   gabapentin (NEURONTIN) 300 MG capsule, TAKE 3 CAPSULES(900 MG) BY MOUTH AT BEDTIME, Disp: 180 capsule, Rfl: 3   methotrexate (RHEUMATREX) 2.5 MG tablet, AS DIRECTED BY HEART FAILURE TEAM.PROTECT FROM LIGHT AS DIRECTED, Disp: 44 tablet, Rfl: 3   Multiple Vitamin (MULTIVITAMIN) tablet, Take 1 tablet by mouth daily., Disp: , Rfl:    pantoprazole (PROTONIX) 40 MG tablet, Take 1 tablet (40 mg total) by mouth daily. 30 min before food, Disp: 90 tablet, Rfl: 3   predniSONE (DELTASONE) 10 MG tablet, AS INSTRUCTED BY HEART FAILURE TEAM, Disp: 252 tablet, Rfl: 0   sacubitril-valsartan (ENTRESTO) 24-26 MG, Take 1 tablet by mouth 2 (two) times daily., Disp: 180 tablet, Rfl: 3   spironolactone (ALDACTONE) 25 MG tablet, Take 0.5 tablets (12.5 mg total) by mouth daily., Disp: 15 tablet, Rfl: 2   sulfamethoxazole-trimethoprim (BACTRIM DS) 800-160 MG tablet, Take 1 tablet by mouth every Monday, Wednesday, and Friday., Disp: 12 tablet, Rfl: 3 Allergies  Allergen Reactions   Crestor [Rosuvastatin Calcium] Other (See Comments)    Elevated muscle enzymes    Lipitor [Atorvastatin Calcium] Other (See Comments)    Elevated muscle enzymes      Social History   Socioeconomic History   Marital status: Married    Spouse name: Sales executive   Number of children: 2   Years of education: Not on file   Highest education level: High school graduate  Occupational History   Occupation: Retired    Comment: Semi   Tobacco Use   Smoking status: Former    Packs/day: 0.50    Types: Cigarettes    Start date: 07/06/1971    Quit date: 06/26/2020    Years since quitting: 1.6   Smokeless tobacco: Never   Tobacco comments:    Former smoker 09/24/21  Vaping Use   Vaping Use: Never used  Substance and Sexual Activity   Alcohol use: No    Alcohol/week: 0.0 standard drinks of alcohol   Drug use: No   Sexual activity: Not on file  Other Topics Concern   Not on file  Social History Narrative   Married    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    On child    1 son, 1 daughter   Machine op   Owns guns    Wears seat belt, safe in relationship    Smoker    Retired 06/27/2019   Social Determinants of Health   Financial Resource Strain: Low Risk  (08/11/2020)   Overall  Financial Resource Strain (CARDIA)    Difficulty of Paying Living Expenses: Not hard at all  Food Insecurity: No Food Insecurity (12/11/2021)   Hunger Vital Sign    Worried About Running Out of Food in the Last Year: Never true    Ran Out of Food in the Last Year: Never true  Transportation Needs: No Transportation Needs (12/11/2021)   PRAPARE - Administrator, Civil Service (Medical): No    Lack of Transportation (Non-Medical): No  Physical Activity: Sufficiently Active (08/11/2020)   Exercise Vital Sign    Days of Exercise per Week: 5 days    Minutes of Exercise per Session: 30 min  Stress: No Stress Concern Present (08/11/2020)   [employer] of Occupational Health - Occupational Stress Questionnaire    Feeling of Stress : Not at all  Social Connections: Unknown (08/11/2020)   Social Connection and Isolation Panel [NHANES]    Frequency of Communication with Friends and Family: More than three times a week    Frequency of Social Gatherings with Friends and Family: More than three times a week    Attends Religious Services: Not on file    Active Member of Clubs or Organizations: Yes    Attends Banker Meetings: Not on file     Marital Status: Married  Intimate Partner Violence: Not At Risk (08/11/2020)   Humiliation, Afraid, Rape, and Kick questionnaire    Fear of Current or Ex-Partner: No    Emotionally Abused: No    Physically Abused: No    Sexually Abused: No    Physical Exam      Future Appointments  Date Time Provider Department Center  03/12/2022  7:00 AM [facility]ES [facility]  03/16/2022  3:00 PM Regan Lemming, MD [facility]  03/22/2022  8:30 AM [facility] PA/NP [facility] None  04/08/2022  9:30 AM Freddie Breech, DPM [facility]  05/06/2022 11:00 AM Dana Allan, MD [facility]  06/11/2022  7:00 AM [facility] DEVICE REMOTES [facility]  09/10/2022  7:00 AM [facility]ICE [facility]OTES [facility]  12/10/2022  7:00 AM [facility] DEVICE REMOTES [facility]  03/11/2023  7:00 AM [facility]ICE [facility]ES [facility]     ACTION: Home visit completed

## 2022-02-24 ENCOUNTER — Other Ambulatory Visit (HOSPITAL_COMMUNITY): Payer: Self-pay

## 2022-02-24 NOTE — Progress Notes (Signed)
Paramedicine Encounter    Patient ID: John Keith, male    DOB: 11/28/1950, 71 y.o.   MRN: [id number]  Arrived for home visit for John Keith where he was seated at his kitchen table alert and oriented reporting to be feeling good today. He denied shortness of breath, chest pain, dizziness, swelling or weight gain over the last week. John Keith has been 100% compliant with his medications over the last week. He reports he has been using 10mg  of melatonin to help with sleep and reports it has improved his sleeping and he feels much better.   I obtained vitals:  WT- 241 BP- 102/62 HR- 60 RR- 16 O2- 95%  No lower leg swelling, wearing compression socks, lung sounds clear.   We reviewed upcoming appointments and confirmed same. I reviewed all meds and filled pill box for one week with refills called into [facility]: -Sulfamethoxazole   John Keith agreed to visit in one week and knows to reach out in the meantime if needed.   Home visit complete.   , EMT-Paramedic [phone] 02/24/2022      Patient Care Team: [name], 02/26/2022, MD as PCP - General (Internal Medicine) Pasty Spillers, MD as PCP - Cardiology (Cardiology)  Patient Active Problem List   Diagnosis Date Noted   Chronic systolic heart failure (HCC) 12/15/2021   Chronic combined systolic and diastolic CHF (congestive heart failure) (HCC) 12/15/2021   Nasal sore 12/15/2021   Syncope 12/07/2021   Ventricular tachycardia (HCC) 12/07/2021   Typical atrial flutter (HCC) 09/24/2021   Secondary hypercoagulable state (HCC) 09/24/2021   Abnormal x-ray of neck 06/16/2021   Abnormal MRI, lumbar spine 10/23/2020   Chronic low back pain 10/23/2020   Neuropathy 10/23/2020   Coronary artery disease of native artery of native heart with stable angina pectoris (HCC) 10/23/2020   Impingement syndrome of left shoulder region 08/22/2020   Pain in joint of left shoulder 07/24/2020   Chronic left shoulder pain 07/11/2020    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   Anemia 12/13/2019   Chest pain of uncertain etiology 10/24/2019   PVC (premature ventricular contraction) 10/24/2019   Gastric ulcer without hemorrhage or perforation 09/28/2019   COVID-19 virus detected 07/12/2019   Elevated troponin 07/07/2019   Elevated serum protein level 07/07/2019   Postherpetic neuralgia 08/17/2018   Benign prostatic hyperplasia 08/01/2018   Hip pain 06/29/2018   Aortic atherosclerosis (HCC) 09/22/2016   Fatigue 06/03/2016   Lower urinary tract symptoms (LUTS) 06/03/2016   Constipation 03/10/2016   Lumbar radiculopathy 02/20/2016   Encounter for immunization 11/26/2015   Insomnia 06/10/2015   External hemorrhoid 03/07/2015   Health care maintenance 01/09/2014   Carpal tunnel syndrome of left wrist 01/03/2013   Hyperlipidemia 11/09/2012   Vitamin D deficiency 11/09/2012   Essential hypertension 11/06/2012   Tobacco abuse 11/06/2012   Seasonal allergies 11/06/2012   Erectile dysfunction 11/06/2012   GERD (gastroesophageal reflux disease) 11/06/2012   DDD (degenerative disc disease), thoracolumbar 11/06/2012   Lactose intolerance 11/06/2012   Diverticulosis of colon without hemorrhage 11/06/2012    Current Outpatient Medications:    amiodarone (PACERONE) 200 MG tablet, Take 1 tablet (200 mg total) by mouth daily., Disp: 30 tablet, Rfl: 5   apixaban (ELIQUIS) 5 MG TABS tablet, Take 1 tablet (5 mg total) by mouth 2 (two) times daily., Disp: 60 tablet, Rfl: 3   calcium carbonate (OS-CAL - DOSED IN MG OF ELEMENTAL CALCIUM) 1250 (500 Ca) MG tablet, Take 1 tablet (500 mg of elemental calcium total) by mouth  daily with breakfast., Disp: 30 tablet, Rfl: 11   carvedilol (COREG) 6.25 MG tablet, Take 1 tablet (6.25 mg total) by mouth 2 (two) times daily with a meal., Disp: 60 tablet, Rfl: 5   Cholecalciferol (VITAMIN D-3) 125 MCG (5000 UT) TABS, Take 1 tablet by mouth daily., Disp: 30 tablet, Rfl: 11   ezetimibe (ZETIA) 10 MG tablet,  Take 1 tablet (10 mg total) by mouth daily., Disp: 90 tablet, Rfl: 3   FARXIGA 10 MG TABS tablet, Take 1 tablet (10 mg total) by mouth daily., Disp: 90 tablet, Rfl: 3   ferrous sulfate 325 (65 FE) MG tablet, Take 325 mg by mouth daily with breakfast., Disp: , Rfl:    folic acid (FOLVITE) 1 MG tablet, Take 1 tablet (1 mg total) by mouth daily., Disp: 30 tablet, Rfl: 11   gabapentin (NEURONTIN) 300 MG capsule, TAKE 3 CAPSULES(900 MG) BY MOUTH AT BEDTIME, Disp: 180 capsule, Rfl: 3   methotrexate (RHEUMATREX) 2.5 MG tablet, AS DIRECTED BY HEART FAILURE TEAM.PROTECT FROM LIGHT AS DIRECTED, Disp: 44 tablet, Rfl: 3   Multiple Vitamin (MULTIVITAMIN) tablet, Take 1 tablet by mouth daily., Disp: , Rfl:    pantoprazole (PROTONIX) 40 MG tablet, Take 1 tablet (40 mg total) by mouth daily. 30 min before food, Disp: 90 tablet, Rfl: 3   predniSONE (DELTASONE) 10 MG tablet, AS INSTRUCTED BY HEART FAILURE TEAM, Disp: 252 tablet, Rfl: 0   sacubitril-valsartan (ENTRESTO) 24-26 MG, Take 1 tablet by mouth 2 (two) times daily., Disp: 180 tablet, Rfl: 3   spironolactone (ALDACTONE) 25 MG tablet, Take 0.5 tablets (12.5 mg total) by mouth daily., Disp: 15 tablet, Rfl: 2   sulfamethoxazole-trimethoprim (BACTRIM DS) 800-160 MG tablet, Take 1 tablet by mouth every Monday, Wednesday, and Friday., Disp: 12 tablet, Rfl: 3 Allergies  Allergen Reactions   Crestor [Rosuvastatin Calcium] Other (See Comments)    Elevated muscle enzymes    Lipitor [Atorvastatin Calcium] Other (See Comments)    Elevated muscle enzymes      Social History   Socioeconomic History   Marital status: Married    Spouse name: Proofreader   Number of children: 2   Years of education: Not on file   Highest education level: High school graduate  Occupational History   Occupation: Retired    Comment: Semi  Tobacco Use   Smoking status: Former    Packs/day: 0.50    Types: Cigarettes    Start date: 07/06/1971    Quit date: 06/26/2020    Years since  quitting: 1.6   Smokeless tobacco: Never   Tobacco comments:    Former smoker 09/24/21  Vaping Use   Vaping Use: Never used  Substance and Sexual Activity   Alcohol use: No    Alcohol/week: 0.0 standard drinks of alcohol   Drug use: No   Sexual activity: Not on file  Other Topics Concern   Not on file  Social History Narrative   Married    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    On child    1 son, 1 daughter   Machine op   Owns guns    Wears seat belt, safe in relationship    Smoker    Retired 06/27/2019   Social Determinants of Health   Financial Resource Strain: Low Risk  (08/11/2020)   Overall Financial Resource Strain (CARDIA)    Difficulty of Paying Living Expenses: Not hard at all  Food Insecurity: No Food Insecurity (12/11/2021)   Hunger Vital Sign    Worried About  Running Out of Food in the Last Year: Never true    Ran Out of Food in the Last Year: Never true  Transportation Needs: No Transportation Needs (12/11/2021)   PRAPARE - Administrator, Civil Service (Medical): No    Lack of Transportation (Non-Medical): No  Physical Activity: Sufficiently Active (08/11/2020)   Exercise Vital Sign    Days of Exercise per Week: 5 days    Minutes of Exercise per Session: 30 min  Stress: No Stress Concern Present (08/11/2020)   [employer] of Occupational Health - Occupational Stress Questionnaire    Feeling of Stress : Not at all  Social Connections: Unknown (08/11/2020)   Social Connection and Isolation Panel [NHANES]    Frequency of Communication with Friends and Family: More than three times a week    Frequency of Social Gatherings with Friends and Family: More than three times a week    Attends Religious Services: Not on file    Active Member of Clubs or Organizations: Yes    Attends Banker Meetings: Not on file    Marital Status: Married  Intimate Partner Violence: Not At Risk (08/11/2020)   Humiliation, Afraid, Rape, and Kick questionnaire    Fear of Current or  Ex-Partner: No    Emotionally Abused: No    Physically Abused: No    Sexually Abused: No    Physical Exam      Future Appointments  Date Time Provider Department Center  03/12/2022  7:00 AM [facility]  03/16/2022  3:00 PM Regan Lemming, MD [facility]  03/22/2022  8:30 AM [facility] PA/NP [facility] None  04/08/2022  9:30 AM Freddie Breech, DPM [facility]  05/06/2022 11:00 AM Dana Allan, MD [facility]  06/11/2022  7:00 AM [facility]  09/10/2022  7:00 AM [facility]  12/10/2022  7:00 AM [facility]  03/11/2023  7:00 AM [facility]     ACTION: Home visit completed

## 2022-03-03 ENCOUNTER — Other Ambulatory Visit (HOSPITAL_COMMUNITY): Payer: Self-pay

## 2022-03-03 NOTE — Progress Notes (Signed)
Paramedicine Encounter    Patient ID: John Keith, male    DOB: 1950/08/28, 71 y.o.   MRN: [id number]  Arrived for home visit for Gillermo who reports to be feeling good today. He states that he had an episode of chest pain on Monday at 11:15. He reports that he was seated when the pain started and described it as a pressure in the center of his chest. He denied dizziness, shortness of breath, diaphoresis during the episode. He admits to eating a sausage egg and cheese biscuit prior to this and states that this happens time to time when he eats greasy or fried foods despite his pantoprazole that he took in the morning. He denied any pain since then. Today he denied shortness of breath, dizziness or chest pain, swelling or significant weight gain.   I obtained vitals and assessment as noted:  WT- 242.2lbs BP- 92/52 HR- 70 O2- 94% RR- 16  He took meds today at 0830.  Lungs clear. No swelling, no JVD, no abdominal distention.  I reviewed meds and confirmed same filling pill box for one week. No missing meds for this weeks box. Refills to be called in as noted-  -Folic Acid -Carvedilol   I reviewed all upcoming appointments with him and wrote down same on his calendar.   I plan to see him in one week.   [name], EMT-Paramedic [phone] 03/03/2022     Patient Care Team: [name], Pasty Spillers, MD as PCP - General (Internal Medicine) Chilton Si, MD as PCP - Cardiology (Cardiology)  Patient Active Problem List   Diagnosis Date Noted   Chronic systolic heart failure (HCC) 12/15/2021   Chronic combined systolic and diastolic CHF (congestive heart failure) (HCC) 12/15/2021   Nasal sore 12/15/2021   Syncope 12/07/2021   Ventricular tachycardia (HCC) 12/07/2021   Typical atrial flutter (HCC) 09/24/2021   Secondary hypercoagulable state (HCC) 09/24/2021   Abnormal x-ray of neck 06/16/2021   Abnormal MRI, lumbar spine 10/23/2020   Chronic low back pain 10/23/2020    Neuropathy 10/23/2020   Coronary artery disease of native artery of native heart with stable angina pectoris (HCC) 10/23/2020   Impingement syndrome of left shoulder region 08/22/2020   Pain in joint of left shoulder 07/24/2020   Chronic left shoulder pain 07/11/2020   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   Anemia 12/13/2019   Chest pain of uncertain etiology 10/24/2019   PVC (premature ventricular contraction) 10/24/2019   Gastric ulcer without hemorrhage or perforation 09/28/2019   COVID-19 virus detected 07/12/2019   Elevated troponin 07/07/2019   Elevated serum protein level 07/07/2019   Postherpetic neuralgia 08/17/2018   Benign prostatic hyperplasia 08/01/2018   Hip pain 06/29/2018   Aortic atherosclerosis (HCC) 09/22/2016   Fatigue 06/03/2016   Lower urinary tract symptoms (LUTS) 06/03/2016   Constipation 03/10/2016   Lumbar radiculopathy 02/20/2016   Encounter for immunization 11/26/2015   Insomnia 06/10/2015   External hemorrhoid 03/07/2015   Health care maintenance 01/09/2014   Carpal tunnel syndrome of left wrist 01/03/2013   Hyperlipidemia 11/09/2012   Vitamin D deficiency 11/09/2012   Essential hypertension 11/06/2012   Tobacco abuse 11/06/2012   Seasonal allergies 11/06/2012   Erectile dysfunction 11/06/2012   GERD (gastroesophageal reflux disease) 11/06/2012   DDD (degenerative disc disease), thoracolumbar 11/06/2012   Lactose intolerance 11/06/2012   Diverticulosis of colon without hemorrhage 11/06/2012    Current Outpatient Medications:    amiodarone (PACERONE) 200 MG tablet, Take 1 tablet (200 mg total) by mouth daily., Disp:  30 tablet, Rfl: 5   apixaban (ELIQUIS) 5 MG TABS tablet, Take 1 tablet (5 mg total) by mouth 2 (two) times daily., Disp: 60 tablet, Rfl: 3   calcium carbonate (OS-CAL - DOSED IN MG OF ELEMENTAL CALCIUM) 1250 (500 Ca) MG tablet, Take 1 tablet (500 mg of elemental calcium total) by mouth daily with breakfast., Disp: 30 tablet, Rfl: 11    carvedilol (COREG) 6.25 MG tablet, Take 1 tablet (6.25 mg total) by mouth 2 (two) times daily with a meal., Disp: 60 tablet, Rfl: 5   Cholecalciferol (VITAMIN D-3) 125 MCG (5000 UT) TABS, Take 1 tablet by mouth daily., Disp: 30 tablet, Rfl: 11   ezetimibe (ZETIA) 10 MG tablet, Take 1 tablet (10 mg total) by mouth daily., Disp: 90 tablet, Rfl: 3   FARXIGA 10 MG TABS tablet, Take 1 tablet (10 mg total) by mouth daily., Disp: 90 tablet, Rfl: 3   ferrous sulfate 325 (65 FE) MG tablet, Take 325 mg by mouth daily with breakfast., Disp: , Rfl:    folic acid (FOLVITE) 1 MG tablet, Take 1 tablet (1 mg total) by mouth daily., Disp: 30 tablet, Rfl: 11   gabapentin (NEURONTIN) 300 MG capsule, TAKE 3 CAPSULES(900 MG) BY MOUTH AT BEDTIME, Disp: 180 capsule, Rfl: 3   methotrexate (RHEUMATREX) 2.5 MG tablet, AS DIRECTED BY HEART FAILURE TEAM.PROTECT FROM LIGHT AS DIRECTED, Disp: 44 tablet, Rfl: 3   Multiple Vitamin (MULTIVITAMIN) tablet, Take 1 tablet by mouth daily., Disp: , Rfl:    pantoprazole (PROTONIX) 40 MG tablet, Take 1 tablet (40 mg total) by mouth daily. 30 min before food, Disp: 90 tablet, Rfl: 3   predniSONE (DELTASONE) 10 MG tablet, AS INSTRUCTED BY HEART FAILURE TEAM, Disp: 252 tablet, Rfl: 0   sacubitril-valsartan (ENTRESTO) 24-26 MG, Take 1 tablet by mouth 2 (two) times daily., Disp: 180 tablet, Rfl: 3   spironolactone (ALDACTONE) 25 MG tablet, Take 0.5 tablets (12.5 mg total) by mouth daily., Disp: 15 tablet, Rfl: 2   sulfamethoxazole-trimethoprim (BACTRIM DS) 800-160 MG tablet, Take 1 tablet by mouth every Monday, Wednesday, and Friday., Disp: 12 tablet, Rfl: 3 Allergies  Allergen Reactions   Crestor [Rosuvastatin Calcium] Other (See Comments)    Elevated muscle enzymes    Lipitor [Atorvastatin Calcium] Other (See Comments)    Elevated muscle enzymes      Social History   Socioeconomic History   Marital status: Married    Spouse name: Proofreader   Number of children: 2   Years of  education: Not on file   Highest education level: High school graduate  Occupational History   Occupation: Retired    Comment: Semi  Tobacco Use   Smoking status: Former    Packs/day: 0.50    Types: Cigarettes    Start date: 07/06/1971    Quit date: 06/26/2020    Years since quitting: 1.6   Smokeless tobacco: Never   Tobacco comments:    Former smoker 09/24/21  Vaping Use   Vaping Use: Never used  Substance and Sexual Activity   Alcohol use: No    Alcohol/week: 0.0 standard drinks of alcohol   Drug use: No   Sexual activity: Not on file  Other Topics Concern   Not on file  Social History Narrative   Married    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    On child    1 son, 1 daughter   Systems analyst op   Owns guns    Wears seat belt, safe in relationship  Smoker    Retired 06/27/2019   Social Determinants of Health   Financial Resource Strain: Low Risk  (08/11/2020)   Overall Financial Resource Strain (CARDIA)    Difficulty of Paying Living Expenses: Not hard at all  Food Insecurity: No Food Insecurity (12/11/2021)   Hunger Vital Sign    Worried About Running Out of Food in the Last Year: Never true    Ran Out of Food in the Last Year: Never true  Transportation Needs: No Transportation Needs (12/11/2021)   PRAPARE - Administrator, Civil Service (Medical): No    Lack of Transportation (Non-Medical): No  Physical Activity: Sufficiently Active (08/11/2020)   Exercise Vital Sign    Days of Exercise per Week: 5 days    Minutes of Exercise per Session: 30 min  Stress: No Stress Concern Present (08/11/2020)   [employer] of Occupational Health - Occupational Stress Questionnaire    Feeling of Stress : Not at all  Social Connections: Unknown (08/11/2020)   Social Connection and Isolation Panel [NHANES]    Frequency of Communication with Friends and Family: More than three times a week    Frequency of Social Gatherings with Friends and Family: More than three times a week    Attends Religious  Services: Not on file    Active Member of Clubs or Organizations: Yes    Attends Banker Meetings: Not on file    Marital Status: Married  Intimate Partner Violence: Not At Risk (08/11/2020)   Humiliation, Afraid, Rape, and Kick questionnaire    Fear of Current or Ex-Partner: No    Emotionally Abused: No    Physically Abused: No    Sexually Abused: No    Physical Exam      Future Appointments  Date Time Provider Department Center  03/12/2022  7:00 AM [facility]  03/16/2022  3:00 PM Regan Lemming, MD [facility]  03/22/2022  8:30 AM [facility] PA/NP [facility] None  04/08/2022  9:30 AM Freddie Breech, DPM [facility]  05/06/2022 11:00 AM Dana Allan, MD [facility]  06/11/2022  7:00 AM [facility] DEVICE [facility]OTES [facility]  09/10/2022  7:00 AM [facility]  12/10/2022  7:00 AM [facility]ICE [facility]ES [facility]  03/11/2023  7:00 AM [facility]ICE [facility]ES [facility]     ACTION: Home visit completed

## 2022-03-10 ENCOUNTER — Other Ambulatory Visit (HOSPITAL_COMMUNITY): Payer: Self-pay

## 2022-03-10 NOTE — Progress Notes (Signed)
Paramedicine Encounter    Patient ID: John Keith, male    DOB: 1951/05/05, 71 y.o.   MRN: [id number]  Arrived for home visit for John Keith who reports he is feeling good today. He denied any recent episodes of chest pain, dizziness or shortness of breath. He states that he has been feeling good over the last week. He was 100% compliant with all meds. He gained 4lbs and admits he has been eating more this week. He says he knows he needs to back off the sweets and will try to do better. We discussed some healthy options to satisfy his sweet tooth and heart healthy dietary options. He agreed to try same.   I obtained vitals and they are as noted:  WT- 246.2lbs BP- 140/80 HR- 58 O2- 97% RR- 16   No lower leg swelling- compliant with compression socks.  Lungs clear.   I reviewed meds and filled pill box for one and a half weeks. We plan to meet in the HF clinic on Sept. 18th.   Refills as noted and called into [facility]: Amiodarone  PCP is tapering Gabapentin at Shay's request as he wants to stop taking it because he has been on it for a long time and feels it is no longer effective. He will take 2 capsules at bedtime for one week and 1 capsule for the next week then stop. I set up pill box for same.   We reviewed transmitting his ICD device using his at home remote transmitter. He understood how to do same and successfully transmitted device today and knows to do so on Friday as well.   We reviewed appointments and wrote down same. I will see Edem at [facility] clinic visit on 9/18. Home visit complete.   [name], EMT-Paramedic [phone] 03/10/2022     Patient Care Team: [name], Pasty Spillers, MD as PCP - General (Internal Medicine) Chilton Si, MD as PCP - Cardiology (Cardiology)  Patient Active Problem List   Diagnosis Date Noted   Chronic systolic heart failure (HCC) 12/15/2021   Chronic combined systolic and diastolic CHF (congestive heart failure) (HCC) 12/15/2021    Nasal sore 12/15/2021   Syncope 12/07/2021   Ventricular tachycardia (HCC) 12/07/2021   Typical atrial flutter (HCC) 09/24/2021   Secondary hypercoagulable state (HCC) 09/24/2021   Abnormal x-ray of neck 06/16/2021   Abnormal MRI, lumbar spine 10/23/2020   Chronic low back pain 10/23/2020   Neuropathy 10/23/2020   Coronary artery disease of native artery of native heart with stable angina pectoris (HCC) 10/23/2020   Impingement syndrome of left shoulder region 08/22/2020   Pain in joint of left shoulder 07/24/2020   Chronic left shoulder pain 07/11/2020   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   Anemia 12/13/2019   Chest pain of uncertain etiology 10/24/2019   PVC (premature ventricular contraction) 10/24/2019   Gastric ulcer without hemorrhage or perforation 09/28/2019   COVID-19 virus detected 07/12/2019   Elevated troponin 07/07/2019   Elevated serum protein level 07/07/2019   Postherpetic neuralgia 08/17/2018   Benign prostatic hyperplasia 08/01/2018   Hip pain 06/29/2018   Aortic atherosclerosis (HCC) 09/22/2016   Fatigue 06/03/2016   Lower urinary tract symptoms (LUTS) 06/03/2016   Constipation 03/10/2016   Lumbar radiculopathy 02/20/2016   Encounter for immunization 11/26/2015   Insomnia 06/10/2015   External hemorrhoid 03/07/2015   Health care maintenance 01/09/2014   Carpal tunnel syndrome of left wrist 01/03/2013   Hyperlipidemia 11/09/2012   Vitamin D deficiency 11/09/2012   Essential hypertension 11/06/2012  Tobacco abuse 11/06/2012   Seasonal allergies 11/06/2012   Erectile dysfunction 11/06/2012   GERD (gastroesophageal reflux disease) 11/06/2012   DDD (degenerative disc disease), thoracolumbar 11/06/2012   Lactose intolerance 11/06/2012   Diverticulosis of colon without hemorrhage 11/06/2012    Current Outpatient Medications:    amiodarone (PACERONE) 200 MG tablet, Take 1 tablet (200 mg total) by mouth daily., Disp: 30 tablet, Rfl: 5   apixaban (ELIQUIS) 5  MG TABS tablet, Take 1 tablet (5 mg total) by mouth 2 (two) times daily., Disp: 60 tablet, Rfl: 3   calcium carbonate (OS-CAL - DOSED IN MG OF ELEMENTAL CALCIUM) 1250 (500 Ca) MG tablet, Take 1 tablet (500 mg of elemental calcium total) by mouth daily with breakfast., Disp: 30 tablet, Rfl: 11   carvedilol (COREG) 6.25 MG tablet, Take 1 tablet (6.25 mg total) by mouth 2 (two) times daily with a meal., Disp: 60 tablet, Rfl: 5   Cholecalciferol (VITAMIN D-3) 125 MCG (5000 UT) TABS, Take 1 tablet by mouth daily., Disp: 30 tablet, Rfl: 11   ezetimibe (ZETIA) 10 MG tablet, Take 1 tablet (10 mg total) by mouth daily., Disp: 90 tablet, Rfl: 3   FARXIGA 10 MG TABS tablet, Take 1 tablet (10 mg total) by mouth daily., Disp: 90 tablet, Rfl: 3   ferrous sulfate 325 (65 FE) MG tablet, Take 325 mg by mouth daily with breakfast., Disp: , Rfl:    folic acid (FOLVITE) 1 MG tablet, Take 1 tablet (1 mg total) by mouth daily., Disp: 30 tablet, Rfl: 11   gabapentin (NEURONTIN) 300 MG capsule, TAKE 3 CAPSULES(900 MG) BY MOUTH AT BEDTIME, Disp: 180 capsule, Rfl: 3   methotrexate (RHEUMATREX) 2.5 MG tablet, AS DIRECTED BY HEART FAILURE TEAM.PROTECT FROM LIGHT AS DIRECTED, Disp: 44 tablet, Rfl: 3   Multiple Vitamin (MULTIVITAMIN) tablet, Take 1 tablet by mouth daily., Disp: , Rfl:    pantoprazole (PROTONIX) 40 MG tablet, Take 1 tablet (40 mg total) by mouth daily. 30 min before food, Disp: 90 tablet, Rfl: 3   predniSONE (DELTASONE) 10 MG tablet, AS INSTRUCTED BY HEART FAILURE TEAM, Disp: 252 tablet, Rfl: 0   sacubitril-valsartan (ENTRESTO) 24-26 MG, Take 1 tablet by mouth 2 (two) times daily., Disp: 180 tablet, Rfl: 3   spironolactone (ALDACTONE) 25 MG tablet, Take 0.5 tablets (12.5 mg total) by mouth daily., Disp: 15 tablet, Rfl: 2   sulfamethoxazole-trimethoprim (BACTRIM DS) 800-160 MG tablet, Take 1 tablet by mouth every Monday, Wednesday, and Friday., Disp: 12 tablet, Rfl: 3 Allergies  Allergen Reactions   Crestor  [Rosuvastatin Calcium] Other (See Comments)    Elevated muscle enzymes    Lipitor [Atorvastatin Calcium] Other (See Comments)    Elevated muscle enzymes      Social History   Socioeconomic History   Marital status: Married    Spouse name: Gwendolyn   Number of children: 2   Years of education: Not on file   Highest education level: High school graduate  Occupational History   Occupation: Retired    Comment: Semi  Tobacco Use   Smoking status: Former    Packs/day: 0.50    Types: Cigarettes    Start date: 07/06/1971    Quit date: 06/26/2020    Years since quitting: 1.7   Smokeless tobacco: Never   Tobacco comments:    Former smoker 09/24/21  Vaping Use   Vaping Use: Never used  Substance and Sexual Activity   Alcohol use: No    Alcohol/week: 0.0 standard drinks of alcohol  Drug use: No   Sexual activity: Not on file  Other Topics Concern   Not on file  Social History Narrative   Married    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    On child    1 son, 1 daughter   Machine op   Owns guns    Wears seat belt, safe in relationship    Smoker    Retired 06/27/2019   Social Determinants of Health   Financial Resource Strain: Low Risk  (08/11/2020)   Overall Financial Resource Strain (CARDIA)    Difficulty of Paying Living Expenses: Not hard at all  Food Insecurity: No Food Insecurity (12/11/2021)   Hunger Vital Sign    Worried About Running Out of Food in the Last Year: Never true    Ran Out of Food in the Last Year: Never true  Transportation Needs: No Transportation Needs (12/11/2021)   PRAPARE - Administrator, Civil Service (Medical): No    Lack of Transportation (Non-Medical): No  Physical Activity: Sufficiently Active (08/11/2020)   Exercise Vital Sign    Days of Exercise per Week: 5 days    Minutes of Exercise per Session: 30 min  Stress: No Stress Concern Present (08/11/2020)   [employer] of Occupational Health - Occupational Stress Questionnaire    Feeling of Stress :  Not at all  Social Connections: Unknown (08/11/2020)   Social Connection and Isolation Panel [NHANES]    Frequency of Communication with Friends and Family: More than three times a week    Frequency of Social Gatherings with Friends and Family: More than three times a week    Attends Religious Services: Not on file    Active Member of Clubs or Organizations: Yes    Attends Banker Meetings: Not on file    Marital Status: Married  Intimate Partner Violence: Not At Risk (08/11/2020)   Humiliation, Afraid, Rape, and Kick questionnaire    Fear of Current or Ex-Partner: No    Emotionally Abused: No    Physically Abused: No    Sexually Abused: No    Physical Exam      Future Appointments  Date Time Provider Department Center  03/12/2022  7:00 AM [facility]  03/16/2022  3:00 PM Regan Lemming, MD [facility]  03/22/2022  8:30 AM [facility] PA/NP [facility] None  04/08/2022  9:30 AM Freddie Breech, DPM [facility]  05/06/2022 11:00 AM Dana Allan, MD [facility]  06/11/2022  7:00 AM [facility]  09/10/2022  7:00 AM [facility]  12/10/2022  7:00 AM [facility]  03/11/2023  7:00 AM [facility]     ACTION: Home visit completed

## 2022-03-12 ENCOUNTER — Ambulatory Visit (INDEPENDENT_AMBULATORY_CARE_PROVIDER_SITE_OTHER): Payer: PPO

## 2022-03-12 DIAGNOSIS — I5042 Chronic combined systolic (congestive) and diastolic (congestive) heart failure: Secondary | ICD-10-CM | POA: Diagnosis not present

## 2022-03-15 ENCOUNTER — Telehealth: Payer: Self-pay | Admitting: Internal Medicine

## 2022-03-15 NOTE — Telephone Encounter (Signed)
Can you see my long term pts from residency been taking care of them since 2012?

## 2022-03-15 NOTE — Telephone Encounter (Signed)
Yes, I can see him.

## 2022-03-15 NOTE — Telephone Encounter (Signed)
Inform pt and wife they can establish with Dr. Jackie Plum in [facility] call for an appt   MD Physician   Primary Contact Information  Phone Fax E-mail Address  [phone] Not available [address]   [place] [address]     Specialties     Internal Medicine

## 2022-03-16 ENCOUNTER — Encounter: Payer: Self-pay | Admitting: Cardiology

## 2022-03-16 ENCOUNTER — Ambulatory Visit: Payer: PPO | Attending: Cardiology | Admitting: Cardiology

## 2022-03-16 ENCOUNTER — Telehealth: Payer: Self-pay

## 2022-03-16 VITALS — BP 112/76 | HR 67 | Ht >= 80 in | Wt 250.2 lb

## 2022-03-16 DIAGNOSIS — I472 Ventricular tachycardia, unspecified: Secondary | ICD-10-CM | POA: Diagnosis not present

## 2022-03-16 DIAGNOSIS — I5022 Chronic systolic (congestive) heart failure: Secondary | ICD-10-CM | POA: Diagnosis not present

## 2022-03-16 LAB — CUP PACEART REMOTE DEVICE CHECK
Battery Remaining Longevity: 135 mo
Battery Voltage: 3.08 V
Brady Statistic RV Percent Paced: 0.02 %
Date Time Interrogation Session: 20230908002207
HighPow Impedance: 65 Ohm
Implantable Lead Implant Date: 20230608
Implantable Lead Location: 753860
Implantable Pulse Generator Implant Date: 20230608
Lead Channel Impedance Value: 285 Ohm
Lead Channel Impedance Value: 380 Ohm
Lead Channel Pacing Threshold Amplitude: 0.875 V
Lead Channel Pacing Threshold Pulse Width: 0.4 ms
Lead Channel Sensing Intrinsic Amplitude: 9.5 mV
Lead Channel Sensing Intrinsic Amplitude: 9.5 mV
Lead Channel Setting Pacing Amplitude: 2 V
Lead Channel Setting Pacing Pulse Width: 0.4 ms
Lead Channel Setting Sensing Sensitivity: 0.3 mV

## 2022-03-16 NOTE — Telephone Encounter (Signed)
Called pt no answer °

## 2022-03-16 NOTE — Progress Notes (Signed)
Electrophysiology Office Note   Date:  03/16/2022   ID:  John Keith, DOB 1951/03/25, MRN [id number]  PCP:  [name], Pasty Spillers, MD  Cardiologist:  [name] Primary Electrophysiologist:  Porsha Skilton Jorja Loa, MD    Chief Complaint: ICD   History of Present Illness: John Keith is a 71 y.o. male who is being seen today for the evaluation of ICD at the request of [name], French Ana *. Presenting today for electrophysiology evaluation.  He has a history significant hyperlipidemia, hypertension, tobacco abuse, gastric ulcer, atrial flutter.  He was referred to the hospital from the A-fib clinic after cardiac monitor showed episodes of ventricular tachycardia.  He had episodes of syncope.  He had a cardiac MRI which showed partial bowel sarcoidosis.  Left heart catheterization showed minimal coronary artery disease.  He is now status post Medtronic ICD implanted 12/10/2021.  Today, he denies symptoms of palpitations, chest pain, shortness of breath, orthopnea, PND, lower extremity edema, claudication, dizziness, presyncope, syncope, bleeding, or neurologic sequela. The patient is tolerating medications without difficulties.    Past Medical History:  Diagnosis Date   Allergy    Arthritis    Atrial flutter (HCC)    Carpal tunnel syndrome on left    Chest pain of uncertain etiology 10/24/2019   COVID-19    07/07/19   DDD (degenerative disc disease), thoracolumbar    multilevel   Degenerative joint disease of left shoulder    02/2011    Diverticulosis    left colon (2008)    ED (erectile dysfunction)    02/2011    GERD (gastroesophageal reflux disease)    H. pylori infection    Hyperlipidemia    Hypertension    Insomnia    Lactose intolerance    Low back pain    PVC (premature ventricular contraction) 10/24/2019   Shingles    07/2018   Smoking    smoking since age 43 y.o   Toe fracture, left    4th in 2014   Vitamin D deficiency    Past Surgical History:   Procedure Laterality Date   APPENDECTOMY     BACK SURGERY     x 2 10991 and 1995 in [place]    CARDIAC CATHETERIZATION     1999 nl   CARDIOVERSION N/A 10/19/2021   Procedure: CARDIOVERSION;  Surgeon: Christell Constant, MD;  Location: [facility] ENDOSCOPY;  Service: Cardiovascular;  Laterality: N/A;   CARPAL TUNNEL RELEASE Left    COLONOSCOPY     2008 diverticulosis   ESOPHAGOGASTRODUODENOSCOPY     h/o +h. pylori   ICD IMPLANT N/A 12/10/2021   Procedure: ICD IMPLANT;  Surgeon: Regan Lemming, MD;  Location: [facility] INVASIVE CV LAB;  Service: Cardiovascular;  Laterality: N/A;   LEFT HEART CATH AND CORONARY ANGIOGRAPHY N/A 12/08/2021   Procedure: LEFT HEART CATH AND CORONARY ANGIOGRAPHY;  Surgeon: Corky Crafts, MD;  Location: [facility] INVASIVE CV LAB;  Service: Cardiovascular;  Laterality: N/A;   OTHER SURGICAL HISTORY     heart catherization 1999 normal    SPINE SURGERY     x 2      Current Outpatient Medications  Medication Sig Dispense Refill   amiodarone (PACERONE) 200 MG tablet Take 1 tablet (200 mg total) by mouth daily. 30 tablet 5   apixaban (ELIQUIS) 5 MG TABS tablet Take 1 tablet (5 mg total) by mouth 2 (two) times daily. 60 tablet 3   calcium carbonate (OS-CAL - DOSED IN MG OF ELEMENTAL CALCIUM) 1250 (500  Ca) MG tablet Take 1 tablet (500 mg of elemental calcium total) by mouth daily with breakfast. 30 tablet 11   carvedilol (COREG) 6.25 MG tablet Take 1 tablet (6.25 mg total) by mouth 2 (two) times daily with a meal. 60 tablet 5   Cholecalciferol (VITAMIN D-3) 125 MCG (5000 UT) TABS Take 1 tablet by mouth daily. 30 tablet 11   ezetimibe (ZETIA) 10 MG tablet Take 1 tablet (10 mg total) by mouth daily. 90 tablet 3   FARXIGA 10 MG TABS tablet Take 1 tablet (10 mg total) by mouth daily. 90 tablet 3   ferrous sulfate 325 (65 FE) MG tablet Take 325 mg by mouth daily with breakfast.     folic acid (FOLVITE) 1 MG tablet Take 1 tablet (1 mg total) by mouth daily. 30 tablet 11   gabapentin  (NEURONTIN) 300 MG capsule TAKE 3 CAPSULES(900 MG) BY MOUTH AT BEDTIME (Patient taking differently: Take 300 mg by mouth. daily) 180 capsule 3   methotrexate (RHEUMATREX) 2.5 MG tablet AS DIRECTED BY HEART FAILURE TEAM.PROTECT FROM LIGHT AS DIRECTED 44 tablet 3   Multiple Vitamin (MULTIVITAMIN) tablet Take 1 tablet by mouth daily.     pantoprazole (PROTONIX) 40 MG tablet Take 1 tablet (40 mg total) by mouth daily. 30 min before food 90 tablet 3   predniSONE (DELTASONE) 10 MG tablet AS INSTRUCTED BY HEART FAILURE TEAM 252 tablet 0   sacubitril-valsartan (ENTRESTO) 24-26 MG Take 1 tablet by mouth 2 (two) times daily. 180 tablet 3   spironolactone (ALDACTONE) 25 MG tablet Take 0.5 tablets (12.5 mg total) by mouth daily. 15 tablet 2   sulfamethoxazole-trimethoprim (BACTRIM DS) 800-160 MG tablet Take 1 tablet by mouth every Monday, Wednesday, and Friday. 12 tablet 3   No current facility-administered medications for this visit.    Allergies:   Crestor [rosuvastatin calcium] and Lipitor [atorvastatin calcium]   Social History:  The patient  reports that he quit smoking about 20 months ago. His smoking use included cigarettes. He started smoking about 50 years ago. He smoked an average of .5 packs per day. He has never used smokeless tobacco. He reports that he does not drink alcohol and does not use drugs.   Family History:  The patient's family history includes Cancer in his father and maternal uncle; Diabetes in his maternal aunt; Heart attack in his maternal grandmother; Heart disease in his mother; Throat cancer (age of onset: 7) in his father.    ROS:  Please see the history of present illness.   Otherwise, review of systems is positive for none.   All other systems are reviewed and negative.    PHYSICAL EXAM: VS:  BP 112/76   Pulse 67   Ht 6\' 9"  (2.057 m)   Wt 250 lb 3.2 oz (113.5 kg)   SpO2 92%   BMI 26.81 kg/m  , BMI Body mass index is 26.81 kg/m. GEN: Well nourished, well  developed, in no acute distress  HEENT: normal  Neck: no JVD, carotid bruits, or masses Cardiac: RRR; no murmurs, rubs, or gallops,no edema  Respiratory:  clear to auscultation bilaterally, normal work of breathing GI: soft, nontender, nondistended, + BS MS: no deformity or atrophy  Skin: warm and dry, device pocket is well healed Neuro:  Strength and sensation are intact Psych: euthymic mood, full affect  EKG:  EKG is ordered today. Personal review of the ekg ordered shows sinus rhythm, rate 67  Device interrogation is reviewed today in detail.  See  PaceArt for details.   Recent Labs: 12/03/2021: TSH 1.160 12/07/2021: Magnesium 2.4 01/18/2022: ALT 43; B Natriuretic Peptide 33.5; Hemoglobin 12.8; Platelets 263 01/28/2022: BUN 14; Creatinine, Ser 1.45; Potassium 3.7; Sodium 140    Lipid Panel     Component Value Date/Time   CHOL 171 08/27/2021 1215   TRIG 179.0 (H) 08/27/2021 1215   HDL 48.70 08/27/2021 1215   CHOLHDL 4 08/27/2021 1215   VLDL 35.8 08/27/2021 1215   LDLCALC 87 08/27/2021 1215   LDLDIRECT 108.0 05/15/2021 1403     Wt Readings from Last 3 Encounters:  03/16/22 250 lb 3.2 oz (113.5 kg)  03/10/22 246 lb 3.2 oz (111.7 kg)  03/03/22 242 lb 3.2 oz (109.9 kg)      Other studies Reviewed: Additional studies/ records that were reviewed today include: CMRI 12/09/21  Review of the above records today demonstrates:  1. Basal septal midwall late gadolinium enhancement, which is a scar pattern seen in nonischemic cardiomyopathies and associated with worse prognosis. There is also a focal area of LGE in basal inferior wall, as well as RV insertion site LGE (which is a nonspecific scar pattern often seen in setting of elevated pulmonary pressures). Given multifocal areas of LGE with high intensity in the basal septal LGE, and considering myocardial T2 values were elevated in lateral wall concerning for myocardial inflammation, raises concern for sarcoidosis. Recommend  cardiac PET for further evaluation   2. Mild LV dilation, mild hypertrophy, and moderate systolic dysfunction (EF 33%)   3.  Normal RV size with mild systolic dysfunction (EF 40%)  LHC 12/08/21   Dist LAD lesion is 40% stenosed.   LV end diastolic pressure is normal.   There is no aortic valve stenosis.    ASSESSMENT AND PLAN:  1.  Ventricular tachycardia: Status post Medtronic ICD implanted 12/08/2021.  Device functioning appropriately.  No changes at this time.  2.  Atrial flutter: Currently on amiodarone 200 mg daily, Eliquis 5 mg twice daily.  CHA2DS2-VASc of at least 3.  Remains in sinus rhythm.  No changes.  3.  Secondary hypercoagulable state: Currently on Eliquis for atrial or as above.  4.  Hypertension:well controlled  5.  Chronic systolic heart failure: Due to nonischemic cardiomyopathy.  Cardiac PET without evidence of sarcoidosis or inflammation.  Plan per primary cardiology.    Current medicines are reviewed at length with the patient today.   The patient does not have concerns regarding his medicines.  The following changes were made today:  none  Labs/ tests ordered today include:  Orders Placed This Encounter  Procedures   EKG 12-Lead     Disposition:   FU 1 year  Signed, John Ternes Jorja Loa, MD  03/16/2022 3:24 PM     [facility] [address] [place] [address] [phone] (office) [phone] (fax)

## 2022-03-16 NOTE — Telephone Encounter (Signed)
Gave pt a call in regards to what Dr. French Ana said the phone kept ringing he did not answer was unable to leave a msg:    [name], Pasty Spillers, MD  You 15 hours ago (6:00 PM)   TM Inform pt and wife they can establish with Dr. Jackie Plum in [place] call for an appt    MD Physician    Primary Contact Information   Phone Fax E-mail Address  [phone] Not available [address]    [place] [address]       Specialties        Internal Medicine                               Note

## 2022-03-17 NOTE — Telephone Encounter (Signed)
Patient returned office phone call and note was read. Patient said "Thank you".

## 2022-03-22 ENCOUNTER — Ambulatory Visit (HOSPITAL_COMMUNITY)
Admission: RE | Admit: 2022-03-22 | Discharge: 2022-03-22 | Disposition: A | Discharge status: Home / Self Care | Payer: PPO | Source: Ambulatory Visit | Attending: Family Medicine | Admitting: Family Medicine

## 2022-03-22 ENCOUNTER — Other Ambulatory Visit (HOSPITAL_COMMUNITY): Payer: Self-pay

## 2022-03-22 ENCOUNTER — Other Ambulatory Visit (HOSPITAL_COMMUNITY): Payer: Self-pay | Admitting: Physician Assistant

## 2022-03-22 ENCOUNTER — Encounter (HOSPITAL_COMMUNITY): Payer: Self-pay

## 2022-03-22 VITALS — BP 136/84 | HR 67 | Wt 249.0 lb

## 2022-03-22 DIAGNOSIS — I5022 Chronic systolic (congestive) heart failure: Secondary | ICD-10-CM | POA: Insufficient documentation

## 2022-03-22 DIAGNOSIS — Z87891 Personal history of nicotine dependence: Secondary | ICD-10-CM | POA: Insufficient documentation

## 2022-03-22 DIAGNOSIS — Z79631 Long term (current) use of antimetabolite agent: Secondary | ICD-10-CM | POA: Diagnosis not present

## 2022-03-22 DIAGNOSIS — K219 Gastro-esophageal reflux disease without esophagitis: Secondary | ICD-10-CM | POA: Diagnosis not present

## 2022-03-22 DIAGNOSIS — I428 Other cardiomyopathies: Secondary | ICD-10-CM | POA: Insufficient documentation

## 2022-03-22 DIAGNOSIS — Z95810 Presence of automatic (implantable) cardiac defibrillator: Secondary | ICD-10-CM | POA: Diagnosis not present

## 2022-03-22 DIAGNOSIS — Z79899 Other long term (current) drug therapy: Secondary | ICD-10-CM | POA: Insufficient documentation

## 2022-03-22 DIAGNOSIS — Z7984 Long term (current) use of oral hypoglycemic drugs: Secondary | ICD-10-CM | POA: Insufficient documentation

## 2022-03-22 DIAGNOSIS — I472 Ventricular tachycardia, unspecified: Secondary | ICD-10-CM | POA: Diagnosis not present

## 2022-03-22 DIAGNOSIS — I110 Hypertensive heart disease with heart failure: Secondary | ICD-10-CM | POA: Diagnosis not present

## 2022-03-22 DIAGNOSIS — I4892 Unspecified atrial flutter: Secondary | ICD-10-CM | POA: Diagnosis not present

## 2022-03-22 DIAGNOSIS — Z7901 Long term (current) use of anticoagulants: Secondary | ICD-10-CM | POA: Diagnosis not present

## 2022-03-22 DIAGNOSIS — I10 Essential (primary) hypertension: Secondary | ICD-10-CM | POA: Insufficient documentation

## 2022-03-22 DIAGNOSIS — Z7952 Long term (current) use of systemic steroids: Secondary | ICD-10-CM | POA: Insufficient documentation

## 2022-03-22 DIAGNOSIS — Z8249 Family history of ischemic heart disease and other diseases of the circulatory system: Secondary | ICD-10-CM | POA: Diagnosis not present

## 2022-03-22 DIAGNOSIS — I2510 Atherosclerotic heart disease of native coronary artery without angina pectoris: Secondary | ICD-10-CM | POA: Insufficient documentation

## 2022-03-22 LAB — CBC WITH DIFFERENTIAL/PLATELET
Abs Immature Granulocytes: 0.03 10*3/uL (ref 0.00–0.07)
Basophils Absolute: 0 10*3/uL (ref 0.0–0.1)
Basophils Relative: 0 %
Eosinophils Absolute: 0.1 10*3/uL (ref 0.0–0.5)
Eosinophils Relative: 1 %
HCT: 39.2 % (ref 39.0–52.0)
Hemoglobin: 12.6 g/dL — ABNORMAL LOW (ref 13.0–17.0)
Immature Granulocytes: 0 %
Lymphocytes Relative: 24 %
Lymphs Abs: 1.7 10*3/uL (ref 0.7–4.0)
MCH: 33.1 pg (ref 26.0–34.0)
MCHC: 32.1 g/dL (ref 30.0–36.0)
MCV: 102.9 fL — ABNORMAL HIGH (ref 80.0–100.0)
Monocytes Absolute: 0.6 10*3/uL (ref 0.1–1.0)
Monocytes Relative: 8 %
Neutro Abs: 4.7 10*3/uL (ref 1.7–7.7)
Neutrophils Relative %: 67 %
Platelets: 293 10*3/uL (ref 150–400)
RBC: 3.81 MIL/uL — ABNORMAL LOW (ref 4.22–5.81)
RDW: 14.2 % (ref 11.5–15.5)
WBC: 7.1 10*3/uL (ref 4.0–10.5)
nRBC: 0 % (ref 0.0–0.2)

## 2022-03-22 LAB — COMPREHENSIVE METABOLIC PANEL
ALT: 40 U/L (ref 0–44)
AST: 27 U/L (ref 15–41)
Albumin: 3.6 g/dL (ref 3.5–5.0)
Alkaline Phosphatase: 61 U/L (ref 38–126)
Anion gap: 10 (ref 5–15)
BUN: 15 mg/dL (ref 8–23)
CO2: 26 mmol/L (ref 22–32)
Calcium: 9.4 mg/dL (ref 8.9–10.3)
Chloride: 107 mmol/L (ref 98–111)
Creatinine, Ser: 1.31 mg/dL — ABNORMAL HIGH (ref 0.61–1.24)
GFR, Estimated: 58 mL/min — ABNORMAL LOW (ref >60)
Glucose, Bld: 79 mg/dL (ref 70–99)
Potassium: 3.4 mmol/L — ABNORMAL LOW (ref 3.5–5.1)
Sodium: 143 mmol/L (ref 135–145)
Total Bilirubin: 0.5 mg/dL (ref 0.3–1.2)
Total Protein: 6.5 g/dL (ref 6.5–8.1)

## 2022-03-22 MED ORDER — SPIRONOLACTONE 25 MG PO TABS: 25.0000 mg | ORAL_TABLET | Freq: Every day | ORAL | 6 refills | Status: DC

## 2022-03-22 NOTE — Progress Notes (Signed)
Paramedicine Encounter    Patient ID: John Keith, male    DOB: 11/03/1950, 71 y.o.   MRN: [id number]   Met with John Keith in clinic today where he was seen by John Milford, NP. He reports feeling good. He denied shortness of breath, dizziness, chest pain or swelling. He did admit he has had some trouble urinating sometimes. He has missed no doses of his medications and reports he has been trying to maintain his fluid and salt restrictions.   Vitals were obtained and meds were reviewed. Pet Scan results from [facility] reported to be no evidence of sarcoidosis per John.   Med changes made today by John- INCREASE SPIRO TO 25MG   Labs today, repeat in one week at [facility] in [place].   I reviewed meds and filled pill box for two weeks. I plan to see John Keith in the home next Wednesday. He agreed with plans.   Clinic visit complete.   Refills: Bactrim Carvedilol Prednisone (needs new RX)    John Keith, EMT-Paramedic [phone] 03/22/2022   Patient Care Team: John Keith, Tracy N, MD as PCP - General (Internal Medicine) , Tiffany, MD as PCP - Cardiology (Cardiology) Camnitz, Will Martin, MD as PCP - Electrophysiology (Cardiology) Burns, Stacy J, MD as Consulting Physician (Internal Medicine)  Patient Active Problem List   Diagnosis Date Noted   Chronic systolic heart failure (HCC) 12/15/2021   Chronic combined systolic and diastolic CHF (congestive heart failure) (HCC) 12/15/2021   Nasal sore 12/15/2021   Syncope 12/07/2021   Ventricular tachycardia (HCC) 12/07/2021   Typical atrial flutter (HCC) 09/24/2021   Secondary hypercoagulable state (HCC) 09/24/2021   Abnormal x-ray of neck 06/16/2021   Abnormal MRI, lumbar spine 10/23/2020   Chronic low back pain 10/23/2020   Neuropathy 10/23/2020   Coronary artery disease of native artery of native heart with stable angina pectoris (HCC) 10/23/2020   Impingement syndrome of left shoulder region 08/22/2020   Pain  in joint of left shoulder 07/24/2020   Chronic left shoulder pain 07/11/2020   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   Anemia 12/13/2019   Chest pain of uncertain etiology 10/24/2019   PVC (premature ventricular contraction) 10/24/2019   Gastric ulcer without hemorrhage or perforation 09/28/2019   COVID-19 virus detected 07/12/2019   Elevated troponin 07/07/2019   Elevated serum protein level 07/07/2019   Postherpetic neuralgia 08/17/2018   Benign prostatic hyperplasia 08/01/2018   Hip pain 06/29/2018   Aortic atherosclerosis (HCC) 09/22/2016   Fatigue 06/03/2016   Lower urinary tract symptoms (LUTS) 06/03/2016   Constipation 03/10/2016   Lumbar radiculopathy 02/20/2016   Encounter for immunization 11/26/2015   Insomnia 06/10/2015   External hemorrhoid 03/07/2015   Health care maintenance 01/09/2014   Carpal tunnel syndrome of left wrist 01/03/2013   Hyperlipidemia 11/09/2012   Vitamin D deficiency 11/09/2012   Essential hypertension 11/06/2012   Tobacco abuse 11/06/2012   Seasonal allergies 11/06/2012   Erectile dysfunction 11/06/2012   GERD (gastroesophageal reflux disease) 11/06/2012   DDD (degenerative disc disease), thoracolumbar 11/06/2012   Lactose intolerance 11/06/2012   Diverticulosis of colon without hemorrhage 11/06/2012    Current Outpatient Medications:    amiodarone (PACERONE) 200 MG tablet, Take 1 tablet (200 mg total) by mouth daily., Disp: 30 tablet, Rfl: 5   apixaban (ELIQUIS) 5 MG TABS tablet, Take 1 tablet (5 mg total) by mouth 2 (two) times daily., Disp: 60 tablet, Rfl: 3   calcium carbonate (OS-CAL - DOSED IN MG OF ELEMENTAL CALCIUM) 1250 (500 Ca) MG tablet, Take 1   Paramedicine Encounter    Patient ID: John Keith, male    DOB: 11/03/1950, 71 y.o.   MRN: [id number]   Met with John Keith in clinic today where he was seen by John Milford, NP. He reports feeling good. He denied shortness of breath, dizziness, chest pain or swelling. He did admit he has had some trouble urinating sometimes. He has missed no doses of his medications and reports he has been trying to maintain his fluid and salt restrictions.   Vitals were obtained and meds were reviewed. Pet Scan results from [facility] reported to be no evidence of sarcoidosis per John.   Med changes made today by John- INCREASE SPIRO TO 25MG   Labs today, repeat in one week at [facility] in [place].   I reviewed meds and filled pill box for two weeks. I plan to see John Keith in the home next Wednesday. He agreed with plans.   Clinic visit complete.   Refills: Bactrim Carvedilol Prednisone (needs new RX)    John Keith, EMT-Paramedic [phone] 03/22/2022   Patient Care Team: John Keith, Tracy N, MD as PCP - General (Internal Medicine) , Tiffany, MD as PCP - Cardiology (Cardiology) Camnitz, Will Martin, MD as PCP - Electrophysiology (Cardiology) Burns, Stacy J, MD as Consulting Physician (Internal Medicine)  Patient Active Problem List   Diagnosis Date Noted   Chronic systolic heart failure (HCC) 12/15/2021   Chronic combined systolic and diastolic CHF (congestive heart failure) (HCC) 12/15/2021   Nasal sore 12/15/2021   Syncope 12/07/2021   Ventricular tachycardia (HCC) 12/07/2021   Typical atrial flutter (HCC) 09/24/2021   Secondary hypercoagulable state (HCC) 09/24/2021   Abnormal x-ray of neck 06/16/2021   Abnormal MRI, lumbar spine 10/23/2020   Chronic low back pain 10/23/2020   Neuropathy 10/23/2020   Coronary artery disease of native artery of native heart with stable angina pectoris (HCC) 10/23/2020   Impingement syndrome of left shoulder region 08/22/2020   Pain  in joint of left shoulder 07/24/2020   Chronic left shoulder pain 07/11/2020   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   Anemia 12/13/2019   Chest pain of uncertain etiology 10/24/2019   PVC (premature ventricular contraction) 10/24/2019   Gastric ulcer without hemorrhage or perforation 09/28/2019   COVID-19 virus detected 07/12/2019   Elevated troponin 07/07/2019   Elevated serum protein level 07/07/2019   Postherpetic neuralgia 08/17/2018   Benign prostatic hyperplasia 08/01/2018   Hip pain 06/29/2018   Aortic atherosclerosis (HCC) 09/22/2016   Fatigue 06/03/2016   Lower urinary tract symptoms (LUTS) 06/03/2016   Constipation 03/10/2016   Lumbar radiculopathy 02/20/2016   Encounter for immunization 11/26/2015   Insomnia 06/10/2015   External hemorrhoid 03/07/2015   Health care maintenance 01/09/2014   Carpal tunnel syndrome of left wrist 01/03/2013   Hyperlipidemia 11/09/2012   Vitamin D deficiency 11/09/2012   Essential hypertension 11/06/2012   Tobacco abuse 11/06/2012   Seasonal allergies 11/06/2012   Erectile dysfunction 11/06/2012   GERD (gastroesophageal reflux disease) 11/06/2012   DDD (degenerative disc disease), thoracolumbar 11/06/2012   Lactose intolerance 11/06/2012   Diverticulosis of colon without hemorrhage 11/06/2012    Current Outpatient Medications:    amiodarone (PACERONE) 200 MG tablet, Take 1 tablet (200 mg total) by mouth daily., Disp: 30 tablet, Rfl: 5   apixaban (ELIQUIS) 5 MG TABS tablet, Take 1 tablet (5 mg total) by mouth 2 (two) times daily., Disp: 60 tablet, Rfl: 3   calcium carbonate (OS-CAL - DOSED IN MG OF ELEMENTAL CALCIUM) 1250 (500 Ca) MG tablet, Take 1   Paramedicine Encounter    Patient ID: John Keith, male    DOB: 11/03/1950, 71 y.o.   MRN: [id number]   Met with John Keith in clinic today where he was seen by John Milford, NP. He reports feeling good. He denied shortness of breath, dizziness, chest pain or swelling. He did admit he has had some trouble urinating sometimes. He has missed no doses of his medications and reports he has been trying to maintain his fluid and salt restrictions.   Vitals were obtained and meds were reviewed. Pet Scan results from [facility] reported to be no evidence of sarcoidosis per John.   Med changes made today by John- INCREASE SPIRO TO 25MG   Labs today, repeat in one week at [facility] in [place].   I reviewed meds and filled pill box for two weeks. I plan to see John Keith in the home next Wednesday. He agreed with plans.   Clinic visit complete.   Refills: Bactrim Carvedilol Prednisone (needs new RX)    John Keith, EMT-Paramedic [phone] 03/22/2022   Patient Care Team: John Keith, Tracy N, MD as PCP - General (Internal Medicine) , Tiffany, MD as PCP - Cardiology (Cardiology) Camnitz, Will Martin, MD as PCP - Electrophysiology (Cardiology) Burns, Stacy J, MD as Consulting Physician (Internal Medicine)  Patient Active Problem List   Diagnosis Date Noted   Chronic systolic heart failure (HCC) 12/15/2021   Chronic combined systolic and diastolic CHF (congestive heart failure) (HCC) 12/15/2021   Nasal sore 12/15/2021   Syncope 12/07/2021   Ventricular tachycardia (HCC) 12/07/2021   Typical atrial flutter (HCC) 09/24/2021   Secondary hypercoagulable state (HCC) 09/24/2021   Abnormal x-ray of neck 06/16/2021   Abnormal MRI, lumbar spine 10/23/2020   Chronic low back pain 10/23/2020   Neuropathy 10/23/2020   Coronary artery disease of native artery of native heart with stable angina pectoris (HCC) 10/23/2020   Impingement syndrome of left shoulder region 08/22/2020   Pain  in joint of left shoulder 07/24/2020   Chronic left shoulder pain 07/11/2020   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   Anemia 12/13/2019   Chest pain of uncertain etiology 10/24/2019   PVC (premature ventricular contraction) 10/24/2019   Gastric ulcer without hemorrhage or perforation 09/28/2019   COVID-19 virus detected 07/12/2019   Elevated troponin 07/07/2019   Elevated serum protein level 07/07/2019   Postherpetic neuralgia 08/17/2018   Benign prostatic hyperplasia 08/01/2018   Hip pain 06/29/2018   Aortic atherosclerosis (HCC) 09/22/2016   Fatigue 06/03/2016   Lower urinary tract symptoms (LUTS) 06/03/2016   Constipation 03/10/2016   Lumbar radiculopathy 02/20/2016   Encounter for immunization 11/26/2015   Insomnia 06/10/2015   External hemorrhoid 03/07/2015   Health care maintenance 01/09/2014   Carpal tunnel syndrome of left wrist 01/03/2013   Hyperlipidemia 11/09/2012   Vitamin D deficiency 11/09/2012   Essential hypertension 11/06/2012   Tobacco abuse 11/06/2012   Seasonal allergies 11/06/2012   Erectile dysfunction 11/06/2012   GERD (gastroesophageal reflux disease) 11/06/2012   DDD (degenerative disc disease), thoracolumbar 11/06/2012   Lactose intolerance 11/06/2012   Diverticulosis of colon without hemorrhage 11/06/2012    Current Outpatient Medications:    amiodarone (PACERONE) 200 MG tablet, Take 1 tablet (200 mg total) by mouth daily., Disp: 30 tablet, Rfl: 5   apixaban (ELIQUIS) 5 MG TABS tablet, Take 1 tablet (5 mg total) by mouth 2 (two) times daily., Disp: 60 tablet, Rfl: 3   calcium carbonate (OS-CAL - DOSED IN MG OF ELEMENTAL CALCIUM) 1250 (500 Ca) MG tablet, Take 1

## 2022-03-22 NOTE — Patient Instructions (Addendum)
Thank you for coming in today  Labs were done today, if any labs are abnormal the clinic will call you No news is good news  INCREASE Spironolactone to 25 mg 1 tablet daily   Your physician recommends that you return for lab work in: 1 week BMP from [facility] you have been given a prescription for it.  Your physician recommends that you schedule a follow-up appointment in:  2-3 months with Dr. Haroldine Laws    Do the following things EVERYDAY: Weigh yourself in the morning before breakfast. Write it down and keep it in a log. Take your medicines as prescribed Eat low salt foods--Limit salt (sodium) to 2000 mg per day.  Stay as active as you can everyday Limit all fluids for the day to less than 2 liters  At the [facility], you and your health needs are our priority. As part of our continuing mission to provide you with exceptional heart care, we have created designated Provider Care Teams. These Care Teams include your primary Cardiologist (physician) and Advanced Practice Providers (APPs- Physician Assistants and Nurse Practitioners) who all work together to provide you with the care you need, when you need it.   You may see any of the following providers on your designated Care Team at your next follow up: Dr Glori Bickers Dr Loralie Champagne Dr. Roxana Hires, NP Lyda Jester, [place] [facility], [place] Forestine Na, NP Audry Riles, PharmD   Please be sure to bring in all your medications bottles to every appointment.   If you have any questions or concerns before your next appointment please send [place] a message through [place] or call our office at [phone].    TO LEAVE A MESSAGE FOR THE NURSE SELECT OPTION 2, PLEASE LEAVE A MESSAGE INCLUDING: YOUR NAME DATE OF BIRTH CALL BACK NUMBER REASON FOR CALL**this is important as we prioritize the call backs  YOU WILL RECEIVE A CALL BACK THE SAME DAY AS LONG AS YOU CALL BEFORE 4:00  PM

## 2022-03-22 NOTE — Progress Notes (Signed)
ADVANCED HF CLINIC NOTE   Primary Care: [name], Pasty Spillers, MD HF Cardiologist: Dr. Gala Romney  HPI: 71 y.o. 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    He established with Afib clinic in March for atrial flutter. He underwent successful DCCV 04/17. Despite restoration of SR he noted recurrent palpitations, lightheadedness and chest discomfort at f/u 05/15. 14 day Zio placed. Seen in Cardiology clinic 12/03/21 with palpitations, recurrent near syncope and a syncopal episode. BP meds reduced. Read on 14 day Zio was expedited and showed sustained episodes of VT (longest 1 minute 30 seconds).    Patient was referred to ED for admission d/t recurrent VT on monitor. EP was consulted. Echo showed EF 35-40%, grade II DD, RV mildly reduced, RVSP 27 mmHg. Cardiac MRI: EF 33%, RVEF 40%, with multifocal areas of LGE with high intensity in basal septal LGE, elevated T2 values in lateral wall concerning for myocardial inflammation, concerning for sarcoidosis. LHC showed mild nonobstructive CAD. He was started on amiodarone and underwent ICD placement 12/10/21. Workup including PET for cardiac sarcoid recommended as outpatient.   NM cardiac amyloid tumor LOC: Visual and quantitative assessment (grade 1, H/CL equal 1.28) is equivocally suggestive of transthyretin amyloidosis.   Follow up 7/23 with Dr. Gala Romney. NYHA II, volume stable. Low dose spiro started.   Cardiac PET at [facility] (8/23) without evidence of cardiac sarcoidosis or inflammation .   Today he returns for HF follow up with his wife and Herbert Seta with paramedicine. Overall feeling fine. He is on MTX 20 mg and pred 15 mg daily. He gets SOB carrying groceries but otherwise no issues with dyspnea. Denies abnormal bleeding, palpitations, CP, dizziness, edema, or PND/Orthopnea. Appetite ok. No fever or chills. Weight at home 243 pounds. Taking all  medications. No ETOH use, quit smoking 06/26/20.    Cardiac Studies:  - Cardiac PET at [facility] (8/23) without evidence of cardiac sarcoidosis or inflammation .   - PYP (6/23): Visual and quantitative assessment (grade 1, H/CL equal 1.28) is equivocally suggestive of transthyretin amyloidosis.  - LHC (6/23): mild, non-obs CAD.    - cMRI (6/23): LVEF 33%, RVEF 40%, with multifocal areas of LGE with high intensity in basal septal LGE, elevated T2 values in lateral wall concerning for myocardial inflammation, concerning for sarcoidosis.   - Echo (6/23): EF 35-40%, grade II DD, RV mildly reduced, RVSP 27 mmHg.  - Zio 14 day (6/23): mostly NSR, multiple episodes of wide-complex tachycardia due to VT  Past Medical History:  Diagnosis Date   Allergy    Arthritis    Atrial flutter (HCC)    Carpal tunnel syndrome on left    Chest pain of uncertain etiology 10/24/2019   COVID-19    07/07/19   DDD (degenerative disc disease), thoracolumbar    multilevel   Degenerative joint disease of left shoulder    02/2011    Diverticulosis    left colon (2008)    ED (erectile dysfunction)    02/2011    GERD (gastroesophageal reflux disease)    H. pylori infection    Hyperlipidemia    Hypertension    Insomnia    Lactose intolerance    Low back pain    PVC (premature ventricular contraction) 10/24/2019   Shingles    07/2018   Smoking    smoking since age 5 y.o   Toe fracture, left    4th  in 2014   Vitamin D deficiency    Current Outpatient Medications  Medication Sig Dispense Refill   amiodarone (PACERONE) 200 MG tablet Take 1 tablet (200 mg total) by mouth daily. 30 tablet 5   apixaban (ELIQUIS) 5 MG TABS tablet Take 1 tablet (5 mg total) by mouth 2 (two) times daily. 60 tablet 3   calcium carbonate (OS-CAL - DOSED IN MG OF ELEMENTAL CALCIUM) 1250 (500 Ca) MG tablet Take 1 tablet (500 mg of elemental calcium total) by mouth daily with breakfast. 30 tablet 11   carvedilol (COREG) 6.25 MG tablet  Take 1 tablet (6.25 mg total) by mouth 2 (two) times daily with a meal. 60 tablet 5   Cholecalciferol (VITAMIN D-3) 125 MCG (5000 UT) TABS Take 1 tablet by mouth daily. 30 tablet 11   ezetimibe (ZETIA) 10 MG tablet Take 1 tablet (10 mg total) by mouth daily. 90 tablet 3   FARXIGA 10 MG TABS tablet Take 1 tablet (10 mg total) by mouth daily. 90 tablet 3   ferrous sulfate 325 (65 FE) MG tablet Take 325 mg by mouth daily with breakfast.     folic acid (FOLVITE) 1 MG tablet Take 1 tablet (1 mg total) by mouth daily. 30 tablet 11   gabapentin (NEURONTIN) 300 MG capsule TAKE 3 CAPSULES(900 MG) BY MOUTH AT BEDTIME 180 capsule 3   methotrexate (RHEUMATREX) 2.5 MG tablet AS DIRECTED BY HEART FAILURE TEAM.PROTECT FROM LIGHT AS DIRECTED 44 tablet 3   Multiple Vitamin (MULTIVITAMIN) tablet Take 1 tablet by mouth daily.     pantoprazole (PROTONIX) 40 MG tablet Take 1 tablet (40 mg total) by mouth daily. 30 min before food 90 tablet 3   predniSONE (DELTASONE) 10 MG tablet AS INSTRUCTED BY HEART FAILURE TEAM 252 tablet 0   sacubitril-valsartan (ENTRESTO) 24-26 MG Take 1 tablet by mouth 2 (two) times daily. 180 tablet 3   spironolactone (ALDACTONE) 25 MG tablet Take 0.5 tablets (12.5 mg total) by mouth daily. 15 tablet 2   sulfamethoxazole-trimethoprim (BACTRIM DS) 800-160 MG tablet Take 1 tablet by mouth every Monday, Wednesday, and Friday. 12 tablet 3   No current facility-administered medications for this encounter.   Allergies  Allergen Reactions   Crestor [Rosuvastatin Calcium] Other (See Comments)    Elevated muscle enzymes    Lipitor [Atorvastatin Calcium] Other (See Comments)    Elevated muscle enzymes    Social History   Socioeconomic History   Marital status: Married    Spouse name: Gwendolyn   Number of children: 2   Years of education: Not on file   Highest education level: High school graduate  Occupational History   Occupation: Retired    Comment: Semi  Tobacco Use   Smoking  status: Former    Packs/day: 0.50    Types: Cigarettes    Start date: 07/06/1971    Quit date: 06/26/2020    Years since quitting: 1.7   Smokeless tobacco: Never   Tobacco comments:    Former smoker 09/24/21  Vaping Use   Vaping Use: Never used  Substance and Sexual Activity   Alcohol use: No    Alcohol/week: 0.0 standard drinks of alcohol   Drug use: No   Sexual activity: Not on file  Other Topics Concern   Not on file  Social History Narrative   Married    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    On child    1 son, 1 daughter   Systems analyst op   Owns guns  Wears seat belt, safe in relationship    Smoker    Retired 06/27/2019   Social Determinants of Health   Financial Resource Strain: Low Risk  (08/11/2020)   Overall Financial Resource Strain (CARDIA)    Difficulty of Paying Living Expenses: Not hard at all  Food Insecurity: No Food Insecurity (12/11/2021)   Hunger Vital Sign    Worried About Running Out of Food in the Last Year: Never true    Ran Out of Food in the Last Year: Never true  Transportation Needs: No Transportation Needs (12/11/2021)   PRAPARE - Administrator, Civil Service (Medical): No    Lack of Transportation (Non-Medical): No  Physical Activity: Sufficiently Active (08/11/2020)   Exercise Vital Sign    Days of Exercise per Week: 5 days    Minutes of Exercise per Session: 30 min  Stress: No Stress Concern Present (08/11/2020)   [employer] of Occupational Health - Occupational Stress Questionnaire    Feeling of Stress : Not at all  Social Connections: Unknown (08/11/2020)   Social Connection and Isolation Panel [NHANES]    Frequency of Communication with Friends and Family: More than three times a week    Frequency of Social Gatherings with Friends and Family: More than three times a week    Attends Religious Services: Not on file    Active Member of Clubs or Organizations: Yes    Attends Banker Meetings: Not on file    Marital Status: Married   Intimate Partner Violence: Not At Risk (08/11/2020)   Humiliation, Afraid, Rape, and Kick questionnaire    Fear of Current or Ex-Partner: No    Emotionally Abused: No    Physically Abused: No    Sexually Abused: No   Family History  Problem Relation Age of Onset   Heart disease Mother        age 38    Throat cancer Father 68   Cancer Father        throat    Cancer Maternal Uncle        uncle ?maternal or paternal died colon cancer in his 48s    Diabetes Maternal Aunt    Heart attack Maternal Grandmother   - His mother and grandmother had heart issues, uncertain if they had CHF.  BP 136/84   Pulse 67   Wt 112.9 kg (249 lb)   SpO2 97%   BMI 26.68 kg/m   Wt Readings from Last 3 Encounters:  03/22/22 112.9 kg (249 lb)  03/16/22 113.5 kg (250 lb 3.2 oz)  03/10/22 111.7 kg (246 lb 3.2 oz)   PHYSICAL EXAM: General:  NAD. No resp difficulty, walked into clinic. HEENT: Normal Neck: Supple. No JVD. Carotids 2+ bilat; no bruits. No lymphadenopathy or thryomegaly appreciated. Cor: PMI nondisplaced. Regular rate & rhythm. No rubs, gallops or murmurs. Lungs: Clear Abdomen: Soft, nontender, nondistended. No hepatosplenomegaly. No bruits or masses. Good bowel sounds. Extremities: No cyanosis, clubbing, rash, edema Neuro: Alert & oriented x 3, cranial nerves grossly intact. Moves all 4 extremities w/o difficulty. Affect pleasant.  MDT ICD interrogation (personally reviewed): OptiVol creeping up, thoracic impedence below threshold, no AF or VT, 3 hrs/day activity  ASSESSMENT & PLAN: Chronic systolic CHF/NICM: - Echo (6/23): EF 35-40%, grade II DD, RV mildly reduced, RVSP 27 mmHg. - Cardiac MRI (6/23): EF 33%, RVEF 40%, with multifocal areas of LGE with high intensity in basal septal LGE, elevated T2 values in lateral wall concerning for myocardial inflammation.  Findings concerning for sarcoidosis.  - LHC (6/23): mild nonobstructive CAD. - Suspect etiology cardiac sarcoidosis (most  likely) vs hereditary LMNA cardiomyopathy. - Cardiac PET at [facility] (8/23) showed no evidence of cardiac sarcoidosis. - Doing well. NYHA II. Volume looks good on exam, mildly up on OptiVol. - Increase spironolactone to 25 mg daily.  - Continue Coreg 6.25 mg bid. - Continue Entresto 24/26 mg bid. - Continue Farxiga 10 mg daily. - Continue Paramedicine support. - Labs today. BMET in 1 week at [facility]. - I will have device RN send transmission in 7-10 days to follow OptiVol.   - [facility] Immunosuppressive Therapy for Cardiac Sarcoid / Advanced Heart Failure at [facility]    Prednisone  Weeks 1-4: 30 mg daily  Weeks 5-8 : 25 mg daily  Weeks 9-12: 20 mg daily  Weeks 13-16: 15 mg daily Weeks 17-18: 10 mg daily; d/c Bactrim Weeks 19-20: 7.5 mg daily  Weeks 21-22: 5 mg daily Weeks 23-24: 2.5 mg daily  Weeks 25: stop prednisone    Methotrexate Weeks 1&2: 10 mg (four 2.5 mg tablets) once weekly Weeks 3&4: 12.5 mg (five 2.5 mg tablets) once weekly Weeks 5&6: 15 mg (six 2.5 mg tablets) once weekly  Weeks 7&8: 17.5 mg (seven 2.5 mg tablets) once weekly Weeks 9+: 20 mg (eight 2.5 mg tablets ) once weekly    Additional Medications  Bactrim DS : 1 tablet Mon-Wed- Fri while taking >15 mg of prednisone daily    Folic Acid : 1 mg daily    Calcium: 1200-1500 mg daily    Vitamin D: [phone] IU total daily    Omeprazole: 20 mg daily while taking any dose of prednisone.    Monitoring    CBC with diff, AST, ALT, creatinine, and fasting blood glucose             Month 1             Month 2             Month 3             Every 3 months thereafter   2. VT/Hx syncope: - Sustained VT on Zio monitor - Followed by EP. - Continue amiodarone 200 mg daily. - No driving X 6 months (until 12/23). - ICD interrogated personally as above. - TSH (6/23) OK.   3. Atrial flutter: - Regular on exam today. - CHA2DS2-VASc of at least 3.   - On amiodarone as above.  - Continue Eliquis 5 bid. Denies  bleeding issues.   4. CAD: - Mild on LHC 06/23. - No s/s angina. - No aspirin with anticoagulation.  - Continue Zetia. Has history of elevated LFTs with statins.  5. HTN: - Well-controlled. - Meds as above  Doing well. Follow up in 2-3 months with Dr. Haroldine Laws.  Allena Katz, FNP-BC 03/22/22

## 2022-03-23 ENCOUNTER — Other Ambulatory Visit (HOSPITAL_COMMUNITY): Payer: Self-pay | Admitting: *Deleted

## 2022-03-24 ENCOUNTER — Other Ambulatory Visit (HOSPITAL_COMMUNITY): Payer: Self-pay | Admitting: Pharmacist

## 2022-03-24 MED ORDER — PREDNISONE 5 MG PO TABS: ORAL_TABLET | ORAL | 1 refills | Status: DC

## 2022-03-24 MED ORDER — METHOTREXATE 2.5 MG PO TABS: ORAL_TABLET | ORAL | 3 refills | Status: DC

## 2022-03-26 NOTE — Progress Notes (Signed)
Remote ICD transmission.   

## 2022-03-29 ENCOUNTER — Other Ambulatory Visit (HOSPITAL_COMMUNITY): Payer: Self-pay | Admitting: *Deleted

## 2022-03-29 ENCOUNTER — Ambulatory Visit (HOSPITAL_COMMUNITY)
Admission: RE | Admit: 2022-03-29 | Discharge: 2022-03-29 | Disposition: A | Discharge status: Home / Self Care | Payer: PPO | Source: Ambulatory Visit | Attending: Cardiology | Admitting: Cardiology

## 2022-03-29 DIAGNOSIS — I5022 Chronic systolic (congestive) heart failure: Secondary | ICD-10-CM

## 2022-03-29 LAB — BASIC METABOLIC PANEL
Anion gap: 8 (ref 5–15)
BUN: 19 mg/dL (ref 8–23)
CO2: 26 mmol/L (ref 22–32)
Calcium: 9.4 mg/dL (ref 8.9–10.3)
Chloride: 105 mmol/L (ref 98–111)
Creatinine, Ser: 1.27 mg/dL — ABNORMAL HIGH (ref 0.61–1.24)
GFR, Estimated: >60 mL/min (ref >60)
Glucose, Bld: 96 mg/dL (ref 70–99)
Potassium: 3.9 mmol/L (ref 3.5–5.1)
Sodium: 139 mmol/L (ref 135–145)

## 2022-03-31 ENCOUNTER — Other Ambulatory Visit (HOSPITAL_COMMUNITY): Payer: Self-pay

## 2022-03-31 NOTE — Progress Notes (Signed)
Paramedicine Encounter    Patient ID: John Keith, male    DOB: 1951-01-23, 71 y.o.   MRN: [id number]   Arrived for home visit for Mena who reports to be feeling good today. He denied chest pain, dizziness or shortness of breath. He denied any weight gain or swelling over the last week. He has been 100% med compliant with the meds in his pill box.   I obtained vitals and assessment. WT- 242lbs BP- 122/82 HR- 66 O2- 97% RR- 16  No lower leg swelling. He is wearing compression socks daily. Lung sounds clear.   I reviewed all meds and confirmed same. Pill box filled for one week.   Refills: Spironolactone (needed Tuesday and Wednesday morning)   We reviewed appointments and confirmed same.   We discussed HF education and John Keith said he is trying to be mindful of his salt and fluid intake.   Home visit complete. I will see John Keith in one week.   [name], EMT-Paramedic [phone] 03/31/2022               Patient Care Team: [name], [name], MD as PCP - General (Internal Medicine) [name], MD as PCP - Cardiology (Cardiology) [name], MD as PCP - Electrophysiology (Cardiology) [name], MD as Consulting Physician (Internal Medicine)  Patient Active Problem List   Diagnosis Date Noted   Chronic systolic heart failure ([place]) 12/15/2021   Chronic combined systolic and diastolic CHF (congestive heart failure) ([place]) 12/15/2021   Nasal sore 12/15/2021   Syncope 12/07/2021   Ventricular tachycardia ([place]) 12/07/2021   Typical atrial flutter ([place]) 09/24/2021   Secondary hypercoagulable state ([place]) 09/24/2021   Abnormal x-ray of neck 06/16/2021   Abnormal MRI, lumbar spine 10/23/2020   Chronic low back pain 10/23/2020   Neuropathy 10/23/2020   Coronary artery disease of native artery of native heart with stable angina pectoris ([place]) 10/23/2020   Impingement syndrome of left shoulder region 08/22/2020   Pain in joint of left  shoulder 07/24/2020   Chronic left shoulder pain 07/11/2020   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   Anemia 12/13/2019   Chest pain of uncertain etiology 67/20/9470   PVC (premature ventricular contraction) 10/24/2019   Gastric ulcer without hemorrhage or perforation 09/28/2019   COVID-19 virus detected 07/12/2019   Elevated troponin 07/07/2019   Elevated serum protein level 07/07/2019   Postherpetic neuralgia 08/17/2018   Benign prostatic hyperplasia 08/01/2018   Hip pain 06/29/2018   Aortic atherosclerosis ([place]) 09/22/2016   Fatigue 06/03/2016   Lower urinary tract symptoms (LUTS) 06/03/2016   Constipation 03/10/2016   Lumbar radiculopathy 02/20/2016   Encounter for immunization 11/26/2015   Insomnia 06/10/2015   External hemorrhoid 03/07/2015   Health care maintenance 01/09/2014   Carpal tunnel syndrome of left wrist 01/03/2013   Hyperlipidemia 11/09/2012   Vitamin D deficiency 11/09/2012   Essential hypertension 11/06/2012   Tobacco abuse 11/06/2012   Seasonal allergies 11/06/2012   Erectile dysfunction 11/06/2012   GERD (gastroesophageal reflux disease) 11/06/2012   DDD (degenerative disc disease), thoracolumbar 11/06/2012   Lactose intolerance 11/06/2012   Diverticulosis of colon without hemorrhage 11/06/2012    Current Outpatient Medications:    amiodarone (PACERONE) 200 MG tablet, Take 1 tablet (200 mg total) by mouth daily., Disp: 30 tablet, Rfl: 5   apixaban (ELIQUIS) 5 MG TABS tablet, Take 1 tablet (5 mg total) by mouth 2 (two) times daily., Disp: 60 tablet, Rfl: 3   calcium carbonate (OS-CAL - DOSED IN MG OF ELEMENTAL  CALCIUM) 1250 (500 Ca) MG tablet, Take 1 tablet (500 mg of elemental calcium total) by mouth daily with breakfast., Disp: 30 tablet, Rfl: 11   carvedilol (COREG) 6.25 MG tablet, Take 1 tablet (6.25 mg total) by mouth 2 (two) times daily with a meal., Disp: 60 tablet, Rfl: 5   Cholecalciferol (VITAMIN D-3) 125 MCG (5000 UT) TABS, Take 1 tablet by mouth  daily., Disp: 30 tablet, Rfl: 11   ezetimibe (ZETIA) 10 MG tablet, Take 1 tablet (10 mg total) by mouth daily., Disp: 90 tablet, Rfl: 3   FARXIGA 10 MG TABS tablet, Take 1 tablet (10 mg total) by mouth daily., Disp: 90 tablet, Rfl: 3   ferrous sulfate 325 (65 FE) MG tablet, Take 325 mg by mouth daily with breakfast., Disp: , Rfl:    folic acid (FOLVITE) 1 MG tablet, Take 1 tablet (1 mg total) by mouth daily., Disp: 30 tablet, Rfl: 11   gabapentin (NEURONTIN) 300 MG capsule, TAKE 3 CAPSULES(900 MG) BY MOUTH AT BEDTIME, Disp: 180 capsule, Rfl: 3   methotrexate (RHEUMATREX) 2.5 MG tablet, Take 20 mg (8 tablets) every Wednesday. PROTECT FROM LIGHT AS DIRECTED, Disp: 40 tablet, Rfl: 3   Multiple Vitamin (MULTIVITAMIN) tablet, Take 1 tablet by mouth daily., Disp: , Rfl:    pantoprazole (PROTONIX) 40 MG tablet, Take 1 tablet (40 mg total) by mouth daily. 30 min before food, Disp: 90 tablet, Rfl: 3   predniSONE (DELTASONE) 5 MG tablet, Take 15 mg (3 tablets) daily until 04/06/22, then decreased to prednisone 10 mg (2 tablets) daily. Follow titration protocol as directed., Disp: 270 tablet, Rfl: 1   sacubitril-valsartan (ENTRESTO) 24-26 MG, Take 1 tablet by mouth 2 (two) times daily., Disp: 180 tablet, Rfl: 3   spironolactone (ALDACTONE) 25 MG tablet, Take 1 tablet (25 mg total) by mouth daily., Disp: 30 tablet, Rfl: 6   sulfamethoxazole-trimethoprim (BACTRIM DS) 800-160 MG tablet, TAKE 1 TABLET BY MOUTH EVERY MONDAY, WEDNESDAY, AND FRIDAY, Disp: 12 tablet, Rfl: 3 Allergies  Allergen Reactions   Crestor [Rosuvastatin Calcium] Other (See Comments)    Elevated muscle enzymes    Lipitor [Atorvastatin Calcium] Other (See Comments)    Elevated muscle enzymes      Social History   Socioeconomic History   Marital status: Married    Spouse name: Proofreader   Number of children: 2   Years of education: Not on file   Highest education level: High school graduate  Occupational History   Occupation:  Retired    Comment: Semi  Tobacco Use   Smoking status: Former    Packs/day: 0.50    Types: Cigarettes    Start date: 07/06/1971    Quit date: 06/26/2020    Years since quitting: 1.7   Smokeless tobacco: Never   Tobacco comments:    Former smoker 09/24/21  Vaping Use   Vaping Use: Never used  Substance and Sexual Activity   Alcohol use: No    Alcohol/week: 0.0 standard drinks of alcohol   Drug use: No   Sexual activity: Not on file  Other Topics Concern   Not on file  Social History Narrative   Married    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    On child    1 son, 1 daughter   Machine op   Owns guns    Wears seat belt, safe in relationship    Smoker    Retired 06/27/2019   Social Determinants of Health   Financial Resource Strain: Low Risk  (08/11/2020)  Overall Financial Resource Strain (CARDIA)    Difficulty of Paying Living Expenses: Not hard at all  Food Insecurity: No Food Insecurity (12/11/2021)   Hunger Vital Sign    Worried About Running Out of Food in the Last Year: Never true    Ran Out of Food in the Last Year: Never true  Transportation Needs: No Transportation Needs (12/11/2021)   PRAPARE - Administrator, Civil Service (Medical): No    Lack of Transportation (Non-Medical): No  Physical Activity: Sufficiently Active (08/11/2020)   Exercise Vital Sign    Days of Exercise per Week: 5 days    Minutes of Exercise per Session: 30 min  Stress: No Stress Concern Present (08/11/2020)   [employer] of Occupational Health - Occupational Stress Questionnaire    Feeling of Stress : Not at all  Social Connections: Unknown (08/11/2020)   Social Connection and Isolation Panel [NHANES]    Frequency of Communication with Friends and Family: More than three times a week    Frequency of Social Gatherings with Friends and Family: More than three times a week    Attends Religious Services: Not on file    Active Member of Clubs or Organizations: Yes    Attends Banker  Meetings: Not on file    Marital Status: Married  Intimate Partner Violence: Not At Risk (08/11/2020)   Humiliation, Afraid, Rape, and Kick questionnaire    Fear of Current or Ex-Partner: No    Emotionally Abused: No    Physically Abused: No    Sexually Abused: No    Physical Exam      Future Appointments  Date Time Provider Department Center  04/08/2022  9:30 AM Freddie Breech, DPM [facility]  05/06/2022 11:00 AM Dana Allan, MD [facility]  05/20/2022  9:40 AM [name], Bevelyn Buckles, MD [facility] None  06/11/2022  7:00 AM [facility] DEVICE REMOTES [facility]  09/10/2022  7:00 AM [facility] DEVICE REMOTES [facility]  12/10/2022  7:00 AM [facility] DEVICE REMOTES [facility]  03/11/2023  7:00 AM [facility] DEVICE REMOTES [facility]     ACTION: Home visit completed

## 2022-04-07 ENCOUNTER — Other Ambulatory Visit (HOSPITAL_COMMUNITY): Payer: Self-pay

## 2022-04-07 NOTE — Progress Notes (Signed)
Paramedicine Encounter    Patient ID: John Keith, male    DOB: [birth year], 71 y.o.   MRN: [id number]  Arrived for home visit for John Keith who reports to be feeling good and denied any shortness of breath, dizziness, weight gain or swelling. He has been 100% med compliant. He denied any symptoms over the last week. Lungs clear, no lower leg swelling. Weight stable today at 243lbs. Vitals as noted.   WT- 243lbs  BP- 98/62 HR- 65  O2- 95% RR- 16  I reviewed all meds and confirmed same. Noting he is to stop Bactrim today per [place] Protocol. I filled pill box for one week accordingly.   We reviewed appointments and confirmed. He denied any needs at this time. I will see him in one week.   Refills: Amiodarone  (Called into [facility])    [name], EMT-Paramedic [phone] 04/07/2022    Patient Care Team: [name], [name], MD as PCP - General (Internal Medicine) [name], MD as PCP - Cardiology (Cardiology) [name], MD as PCP - Electrophysiology (Cardiology) [name], MD as Consulting Physician (Internal Medicine)  Patient Active Problem List   Diagnosis Date Noted   Chronic systolic heart failure ([place]) 12/15/2021   Chronic combined systolic and diastolic CHF (congestive heart failure) ([place]) 12/15/2021   Nasal sore 12/15/2021   Syncope 12/07/2021   Ventricular tachycardia ([place]) 12/07/2021   Typical atrial flutter ([place]) 09/24/2021   Secondary hypercoagulable state ([place]) 09/24/2021   Abnormal x-ray of neck 06/16/2021   Abnormal MRI, lumbar spine 10/23/2020   Chronic low back pain 10/23/2020   Neuropathy 10/23/2020   Coronary artery disease of native artery of native heart with stable angina pectoris ([place]) 10/23/2020   Impingement syndrome of left shoulder region 08/22/2020   Pain in joint of left shoulder 07/24/2020   Chronic left shoulder pain 07/11/2020   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   Anemia 12/13/2019   Chest pain of  uncertain etiology 43/15/4008   PVC (premature ventricular contraction) 10/24/2019   Gastric ulcer without hemorrhage or perforation 09/28/2019   COVID-19 virus detected 07/12/2019   Elevated troponin 07/07/2019   Elevated serum protein level 07/07/2019   Postherpetic neuralgia 08/17/2018   Benign prostatic hyperplasia 08/01/2018   Hip pain 06/29/2018   Aortic atherosclerosis ([place]) 09/22/2016   Fatigue 06/03/2016   Lower urinary tract symptoms (LUTS) 06/03/2016   Constipation 03/10/2016   Lumbar radiculopathy 02/20/2016   Encounter for immunization 11/26/2015   Insomnia 06/10/2015   External hemorrhoid 03/07/2015   Health care maintenance 01/09/2014   Carpal tunnel syndrome of left wrist 01/03/2013   Hyperlipidemia 11/09/2012   Vitamin D deficiency 11/09/2012   Essential hypertension 11/06/2012   Tobacco abuse 11/06/2012   Seasonal allergies 11/06/2012   Erectile dysfunction 11/06/2012   GERD (gastroesophageal reflux disease) 11/06/2012   DDD (degenerative disc disease), thoracolumbar 11/06/2012   Lactose intolerance 11/06/2012   Diverticulosis of colon without hemorrhage 11/06/2012    Current Outpatient Medications:    amiodarone (PACERONE) 200 MG tablet, Take 1 tablet (200 mg total) by mouth daily., Disp: 30 tablet, Rfl: 5   apixaban (ELIQUIS) 5 MG TABS tablet, Take 1 tablet (5 mg total) by mouth 2 (two) times daily., Disp: 60 tablet, Rfl: 3   calcium carbonate (OS-CAL - DOSED IN MG OF ELEMENTAL CALCIUM) 1250 (500 Ca) MG tablet, Take 1 tablet (500 mg of elemental calcium total) by mouth daily with breakfast., Disp: 30 tablet, Rfl: 11   carvedilol (COREG) 6.25 MG tablet, Take  1 tablet (6.25 mg total) by mouth 2 (two) times daily with a meal., Disp: 60 tablet, Rfl: 5   Cholecalciferol (VITAMIN D-3) 125 MCG (5000 UT) TABS, Take 1 tablet by mouth daily., Disp: 30 tablet, Rfl: 11   ezetimibe (ZETIA) 10 MG tablet, Take 1 tablet (10 mg total) by mouth daily., Disp: 90 tablet, Rfl: 3    FARXIGA 10 MG TABS tablet, Take 1 tablet (10 mg total) by mouth daily., Disp: 90 tablet, Rfl: 3   ferrous sulfate 325 (65 FE) MG tablet, Take 325 mg by mouth daily with breakfast., Disp: , Rfl:    folic acid (FOLVITE) 1 MG tablet, Take 1 tablet (1 mg total) by mouth daily., Disp: 30 tablet, Rfl: 11   gabapentin (NEURONTIN) 300 MG capsule, TAKE 3 CAPSULES(900 MG) BY MOUTH AT BEDTIME, Disp: 180 capsule, Rfl: 3   methotrexate (RHEUMATREX) 2.5 MG tablet, Take 20 mg (8 tablets) every Wednesday. PROTECT FROM LIGHT AS DIRECTED, Disp: 40 tablet, Rfl: 3   Multiple Vitamin (MULTIVITAMIN) tablet, Take 1 tablet by mouth daily., Disp: , Rfl:    pantoprazole (PROTONIX) 40 MG tablet, Take 1 tablet (40 mg total) by mouth daily. 30 min before food, Disp: 90 tablet, Rfl: 3   predniSONE (DELTASONE) 5 MG tablet, Take 15 mg (3 tablets) daily until 04/06/22, then decreased to prednisone 10 mg (2 tablets) daily. Follow titration protocol as directed., Disp: 270 tablet, Rfl: 1   sacubitril-valsartan (ENTRESTO) 24-26 MG, Take 1 tablet by mouth 2 (two) times daily., Disp: 180 tablet, Rfl: 3   spironolactone (ALDACTONE) 25 MG tablet, Take 1 tablet (25 mg total) by mouth daily., Disp: 30 tablet, Rfl: 6   sulfamethoxazole-trimethoprim (BACTRIM DS) 800-160 MG tablet, TAKE 1 TABLET BY MOUTH EVERY MONDAY, WEDNESDAY, AND FRIDAY, Disp: 12 tablet, Rfl: 3 Allergies  Allergen Reactions   Crestor [Rosuvastatin Calcium] Other (See Comments)    Elevated muscle enzymes    Lipitor [Atorvastatin Calcium] Other (See Comments)    Elevated muscle enzymes      Social History   Socioeconomic History   Marital status: Married    Spouse name: John Keith   Number of children: 2   Years of education: Not on file   Highest education level: High school graduate  Occupational History   Occupation: Retired    Comment: Semi  Tobacco Use   Smoking status: Former    Packs/day: 0.50    Types: Cigarettes    Start date: 07/06/1971    Quit  date: 06/26/2020    Years since quitting: 1.7   Smokeless tobacco: Never   Tobacco comments:    Former smoker 09/24/21  Vaping Use   Vaping Use: Never used  Substance and Sexual Activity   Alcohol use: No    Alcohol/week: 0.0 standard drinks of alcohol   Drug use: No   Sexual activity: Not on file  Other Topics Concern   Not on file  Social History Narrative   Married    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    On child    1 son, 1 daughter   Machine op   Owns guns    Wears seat belt, safe in relationship    Smoker    Retired 06/27/2019   Social Determinants of Health   Financial Resource Strain: Low Risk  (08/11/2020)   Overall Financial Resource Strain (CARDIA)    Difficulty of Paying Living Expenses: Not hard at all  Food Insecurity: No Food Insecurity (12/11/2021)   Hunger Vital Sign  Worried About Programme researcher, broadcasting/film/video in the Last Year: Never true    Ran Out of Food in the Last Year: Never true  Transportation Needs: No Transportation Needs (12/11/2021)   PRAPARE - Administrator, Civil Service (Medical): No    Lack of Transportation (Non-Medical): No  Physical Activity: Sufficiently Active (08/11/2020)   Exercise Vital Sign    Days of Exercise per Week: 5 days    Minutes of Exercise per Session: 30 min  Stress: No Stress Concern Present (08/11/2020)   [employer] of Occupational Health - Occupational Stress Questionnaire    Feeling of Stress : Not at all  Social Connections: Unknown (08/11/2020)   Social Connection and Isolation Panel [NHANES]    Frequency of Communication with Friends and Family: More than three times a week    Frequency of Social Gatherings with Friends and Family: More than three times a week    Attends Religious Services: Not on file    Active Member of Clubs or Organizations: Yes    Attends Banker Meetings: Not on file    Marital Status: Married  Intimate Partner Violence: Not At Risk (08/11/2020)   Humiliation, Afraid, Rape, and Kick  questionnaire    Fear of Current or Ex-Partner: No    Emotionally Abused: No    Physically Abused: No    Sexually Abused: No    Physical Exam      Future Appointments  Date Time Provider Department Center  04/08/2022  9:30 AM Freddie Breech, DPM [facility]  05/06/2022 11:00 AM Dana Allan, MD [facility]  05/20/2022  9:40 AM [name], Bevelyn Buckles, MD [facility] None  06/11/2022  7:00 AM [facility] DEVICE REMOTES [facility]  09/10/2022  7:00 AM [facility] DEVICE REMOTES [facility]  12/10/2022  7:00 AM [facility] DEVICE REMOTES [facility]  03/11/2023  7:00 AM [facility] DEVICE REMOTES [facility]     ACTION: Home visit completed

## 2022-04-08 ENCOUNTER — Ambulatory Visit: Payer: PPO | Admitting: Podiatry

## 2022-04-08 ENCOUNTER — Encounter: Payer: Self-pay | Admitting: Podiatry

## 2022-04-08 DIAGNOSIS — B351 Tinea unguium: Secondary | ICD-10-CM | POA: Diagnosis not present

## 2022-04-08 DIAGNOSIS — Q828 Other specified congenital malformations of skin: Secondary | ICD-10-CM | POA: Diagnosis not present

## 2022-04-08 DIAGNOSIS — M79676 Pain in unspecified toe(s): Secondary | ICD-10-CM

## 2022-04-08 DIAGNOSIS — G629 Polyneuropathy, unspecified: Secondary | ICD-10-CM | POA: Diagnosis not present

## 2022-04-11 NOTE — Progress Notes (Signed)
  Subjective:  Patient ID: John Keith, male    DOB: 10-09-1950,  MRN: [id number]  John Keith presents to clinic today for:  Chief Complaint  Patient presents with   Nail Problem    Routine foot care PCP-[name] PCP VST- 2 months ago    New problem(s): None.   PCP is [name], [name], MD , and last visit was  January 28, 2022.  Allergies  Allergen Reactions   Crestor [Rosuvastatin Calcium] Other (See Comments)    Elevated muscle enzymes    Lipitor [Atorvastatin Calcium] Other (See Comments)    Elevated muscle enzymes     Review of Systems: Negative except as noted in the HPI.  Objective: No changes noted in today's physical examination.  John Keith is a pleasant 71 y.o. male WD, WN in NAD. AAO x 3. Neurovascular Examination: Capillary refill time to digits immediate b/l. Palpable pedal pulses b/l LE. Pedal hair present. No pain with calf compression b/l. Lower extremity skin temperature gradient within normal limits. No cyanosis or clubbing noted b/l LE.  Pt has subjective symptoms of neuropathy. Protective sensation intact 5/5 intact bilaterally with 10g monofilament b/l. Vibratory sensation intact b/l.  Dermatological:  Pedal skin is warm and supple b/l LE. No open wounds b/l LE. No interdigital macerations noted b/l LE. Toenails 1-5 bilaterally elongated, discolored, dystrophic, thickened, and crumbly with subungual debris and tenderness to dorsal palpation. Hyperkeratotic lesion(s) submet head 1 left foot.  No erythema, no edema, no drainage, no fluctuance.   Musculoskeletal:  Muscle strength 5/5 to all lower extremity muscle groups bilaterally. HAV with bunion deformity noted b/l LE. Hammertoe deformity noted 2-5 b/l. Patient ambulates independently without assistance.  Assessment/Plan: 1. Pain due to onychomycosis of toenail   2. Porokeratosis   3. Neuropathy     No orders of the defined types were placed in this encounter.   -Consent  given for treatment as described below: -Examined patient. -Patient to continue soft, supportive shoe gear daily. -Mycotic toenails 1-5 bilaterally were debrided in length and girth with sterile nail nippers and dremel without incident. -Callus(es) submet head 1 left foot pared utilizing sterile scalpel blade without complication or incident. Total number debrided =1. -Patient/POA to call should there be question/concern in the interim.   Return in about 3 months (around 07/09/2022).  Marzetta Board, DPM

## 2022-04-14 ENCOUNTER — Other Ambulatory Visit (HOSPITAL_COMMUNITY): Payer: Self-pay

## 2022-04-14 NOTE — Progress Notes (Signed)
Paramedicine Encounter    Patient ID: John Keith, male    DOB: [birth year], 71 y.o.   MRN: [id number]  Arrived for home visit for [name] who reports to be feeling good with no reports of any shortness of breath or dizziness or chest pain. He denied any recent weight gain. He reports compliance with his medications. His pill box empty indicating 100% compliance. I reviewed all meds and filled pill box for one week.   No refills needed.   I obtained vitals: WT- 243lbs  BP- 122/76 HR- 60 02- 94% RR-16 Swelling- none  Lungs- clear    [name] had some paperwork from his insurance he needed assistance reading and responding to as well as assistance with paying his [employer]. I assisted him in the home with these things today teaching him how to do so on his phone. I took papers for insurance company and will have them faxed in.   He was grateful for the help. I reviewed upcoming appointments and confirmed same and planned for home visit in one week.   [name], EMT-Paramedic [phone] 04/14/2022      Patient Care Team: [name], Pasty Spillers, MD as PCP - General (Internal Medicine) Chilton Si, MD as PCP - Cardiology (Cardiology) Regan Lemming, MD as PCP - Electrophysiology (Cardiology) Pincus Sanes, MD as Consulting Physician (Internal Medicine)  Patient Active Problem List   Diagnosis Date Noted   Chronic systolic heart failure ([facility]) 12/15/2021   Chronic combined systolic and diastolic CHF (congestive heart failure) ([facility]) 12/15/2021   Nasal sore 12/15/2021   Syncope 12/07/2021   Ventricular tachycardia ([facility]) 12/07/2021   Typical atrial flutter ([facility]) 09/24/2021   Secondary hypercoagulable state ([facility]) 09/24/2021   Abnormal x-ray of neck 06/16/2021   Abnormal MRI, lumbar spine 10/23/2020   Chronic low back pain 10/23/2020   Neuropathy 10/23/2020   Coronary artery disease of native artery of native heart with stable angina pectoris ([facility])  10/23/2020   Impingement syndrome of left shoulder region 08/22/2020   Pain in joint of left shoulder 07/24/2020   Chronic left shoulder pain 07/11/2020   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   Anemia 12/13/2019   Chest pain of uncertain etiology 10/24/2019   PVC (premature ventricular contraction) 10/24/2019   Gastric ulcer without hemorrhage or perforation 09/28/2019   COVID-19 virus detected 07/12/2019   Elevated troponin 07/07/2019   Elevated serum protein level 07/07/2019   Postherpetic neuralgia 08/17/2018   Benign prostatic hyperplasia 08/01/2018   Hip pain 06/29/2018   Aortic atherosclerosis ([facility]) 09/22/2016   Fatigue 06/03/2016   Lower urinary tract symptoms (LUTS) 06/03/2016   Constipation 03/10/2016   Lumbar radiculopathy 02/20/2016   Encounter for immunization 11/26/2015   Insomnia 06/10/2015   External hemorrhoid 03/07/2015   Health care maintenance 01/09/2014   Carpal tunnel syndrome of left wrist 01/03/2013   Hyperlipidemia 11/09/2012   Vitamin D deficiency 11/09/2012   Essential hypertension 11/06/2012   Tobacco abuse 11/06/2012   Seasonal allergies 11/06/2012   Erectile dysfunction 11/06/2012   GERD (gastroesophageal reflux disease) 11/06/2012   DDD (degenerative disc disease), thoracolumbar 11/06/2012   Lactose intolerance 11/06/2012   Diverticulosis of colon without hemorrhage 11/06/2012    Current Outpatient Medications:    amiodarone (PACERONE) 200 MG tablet, Take 1 tablet (200 mg total) by mouth daily., Disp: 30 tablet, Rfl: 5   apixaban (ELIQUIS) 5 MG TABS tablet, Take 1 tablet (5 mg total) by mouth 2 (two) times daily., Disp: 60 tablet, Rfl: 3  calcium carbonate (OS-CAL - DOSED IN MG OF ELEMENTAL CALCIUM) 1250 (500 Ca) MG tablet, Take 1 tablet (500 mg of elemental calcium total) by mouth daily with breakfast., Disp: 30 tablet, Rfl: 11   carvedilol (COREG) 6.25 MG tablet, Take 1 tablet (6.25 mg total) by mouth 2 (two) times daily with a meal., Disp: 60  tablet, Rfl: 5   Cholecalciferol (VITAMIN D-3) 125 MCG (5000 UT) TABS, Take 1 tablet by mouth daily., Disp: 30 tablet, Rfl: 11   ezetimibe (ZETIA) 10 MG tablet, Take 1 tablet (10 mg total) by mouth daily., Disp: 90 tablet, Rfl: 3   FARXIGA 10 MG TABS tablet, Take 1 tablet (10 mg total) by mouth daily., Disp: 90 tablet, Rfl: 3   ferrous sulfate 325 (65 FE) MG tablet, Take 325 mg by mouth daily with breakfast., Disp: , Rfl:    folic acid (FOLVITE) 1 MG tablet, Take 1 tablet (1 mg total) by mouth daily., Disp: 30 tablet, Rfl: 11   gabapentin (NEURONTIN) 300 MG capsule, TAKE 3 CAPSULES(900 MG) BY MOUTH AT BEDTIME, Disp: 180 capsule, Rfl: 3   methotrexate (RHEUMATREX) 2.5 MG tablet, Take 20 mg (8 tablets) every Wednesday. PROTECT FROM LIGHT AS DIRECTED, Disp: 40 tablet, Rfl: 3   Multiple Vitamin (MULTIVITAMIN) tablet, Take 1 tablet by mouth daily., Disp: , Rfl:    pantoprazole (PROTONIX) 40 MG tablet, Take 1 tablet (40 mg total) by mouth daily. 30 min before food, Disp: 90 tablet, Rfl: 3   predniSONE (DELTASONE) 5 MG tablet, Take 15 mg (3 tablets) daily until 04/06/22, then decreased to prednisone 10 mg (2 tablets) daily. Follow titration protocol as directed., Disp: 270 tablet, Rfl: 1   sacubitril-valsartan (ENTRESTO) 24-26 MG, Take 1 tablet by mouth 2 (two) times daily., Disp: 180 tablet, Rfl: 3   spironolactone (ALDACTONE) 25 MG tablet, Take 1 tablet (25 mg total) by mouth daily., Disp: 30 tablet, Rfl: 6   sulfamethoxazole-trimethoprim (BACTRIM DS) 800-160 MG tablet, TAKE 1 TABLET BY MOUTH EVERY MONDAY, WEDNESDAY, AND FRIDAY (Patient not taking: Reported on 04/07/2022), Disp: 12 tablet, Rfl: 3 Allergies  Allergen Reactions   Crestor [Rosuvastatin Calcium] Other (See Comments)    Elevated muscle enzymes    Lipitor [Atorvastatin Calcium] Other (See Comments)    Elevated muscle enzymes      Social History   Socioeconomic History   Marital status: Married    Spouse name: Sales executive   Number of  children: 2   Years of education: Not on file   Highest education level: High school graduate  Occupational History   Occupation: Retired    Comment: Semi  Tobacco Use   Smoking status: Former    Packs/day: 0.50    Types: Cigarettes    Start date: 07/06/1971    Quit date: 06/26/2020    Years since quitting: 1.8   Smokeless tobacco: Never   Tobacco comments:    Former smoker 09/24/21  Vaping Use   Vaping Use: Never used  Substance and Sexual Activity   Alcohol use: No    Alcohol/week: 0.0 standard drinks of alcohol   Drug use: No   Sexual activity: Not on file  Other Topics Concern   Not on file  Social History Narrative   Married    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    On child    1 son, 1 daughter   Machine op   Owns guns    Wears seat belt, safe in relationship    Smoker    Retired 06/27/2019  Social Determinants of Health   Financial Resource Strain: Low Risk  (08/11/2020)   Overall Financial Resource Strain (CARDIA)    Difficulty of Paying Living Expenses: Not hard at all  Food Insecurity: No Food Insecurity (12/11/2021)   Hunger Vital Sign    Worried About Running Out of Food in the Last Year: Never true    Ran Out of Food in the Last Year: Never true  Transportation Needs: No Transportation Needs (12/11/2021)   PRAPARE - Administrator, Civil Service (Medical): No    Lack of Transportation (Non-Medical): No  Physical Activity: Sufficiently Active (08/11/2020)   Exercise Vital Sign    Days of Exercise per Week: 5 days    Minutes of Exercise per Session: 30 min  Stress: No Stress Concern Present (08/11/2020)   [employer] of Occupational Health - Occupational Stress Questionnaire    Feeling of Stress : Not at all  Social Connections: Unknown (08/11/2020)   Social Connection and Isolation Panel [NHANES]    Frequency of Communication with Friends and Family: More than three times a week    Frequency of Social Gatherings with Friends and Family: More than three times a  week    Attends Religious Services: Not on file    Active Member of Clubs or Organizations: Yes    Attends Banker Meetings: Not on file    Marital Status: Married  Intimate Partner Violence: Not At Risk (08/11/2020)   Humiliation, Afraid, Rape, and Kick questionnaire    Fear of Current or Ex-Partner: No    Emotionally Abused: No    Physically Abused: No    Sexually Abused: No    Physical Exam      Future Appointments  Date Time Provider Department Center  05/06/2022 11:00 AM Dana Allan, MD [facility]  05/20/2022  9:40 AM [name], Bevelyn Buckles, MD [facility] None  06/11/2022  7:00 AM [facility] DEVICE REMOTES [facility]  07/22/2022  9:00 AM Freddie Breech, DPM [facility]  09/10/2022  7:00 AM [facility] DEVICE REMOTES [facility]  12/10/2022  7:00 AM [facility] DEVICE REMOTES [facility]  03/11/2023  7:00 AM [facility] DEVICE REMOTES [facility]     ACTION: Home visit completed

## 2022-04-21 ENCOUNTER — Other Ambulatory Visit (HOSPITAL_COMMUNITY): Payer: Self-pay

## 2022-04-21 NOTE — Progress Notes (Signed)
Paramedicine Encounter    Patient ID: John Keith, male    DOB: 1951/05/23, 71 y.o.   MRN: [id number]   Arrived for home visit for John Keith where he reports to be feeling okay with no complaints. He denied shortness of breath, dizziness, chest pain or weight gain. He denied swelling in his lower legs reporting that he has been wearing his compression socks daily and has had no swelling. None noted on assessment. Lungs clear. Vitals as noted: WT- 242lbs BP- 88/58 HR- 67 O2- 98% RR- 16    I reviewed meds and confirmed same filling pill box for one week. No refills noted.   Appointments confirmed.   Home visit complete I will see John Keith in one week.   John Keith, EMT-Paramedic [phone] 04/21/2022    Patient Care Team: John, Keith Spillers, Keith as PCP - General (Internal Medicine) John Si, Keith as PCP - Cardiology (Cardiology) John Lemming, Keith as PCP - Electrophysiology (Cardiology) John Sanes, Keith as Consulting Physician (Internal Medicine)  Patient Active Problem List   Diagnosis Date Noted   Chronic systolic heart failure (HCC) 12/15/2021   Chronic combined systolic and diastolic CHF (congestive heart failure) (HCC) 12/15/2021   Nasal sore 12/15/2021   Syncope 12/07/2021   Ventricular tachycardia (HCC) 12/07/2021   Typical atrial flutter (HCC) 09/24/2021   Secondary hypercoagulable state (HCC) 09/24/2021   Abnormal x-ray of neck 06/16/2021   Abnormal MRI, lumbar spine 10/23/2020   Chronic low back pain 10/23/2020   Neuropathy 10/23/2020   Coronary artery disease of native artery of native heart with stable angina pectoris (HCC) 10/23/2020   Impingement syndrome of left shoulder region 08/22/2020   Pain in joint of left shoulder 07/24/2020   Chronic left shoulder pain 07/11/2020   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   Anemia 12/13/2019   Chest pain of uncertain etiology 10/24/2019   PVC (premature ventricular contraction) 10/24/2019   Gastric  ulcer without hemorrhage or perforation 09/28/2019   COVID-19 virus detected 07/12/2019   Elevated troponin 07/07/2019   Elevated serum protein level 07/07/2019   Postherpetic neuralgia 08/17/2018   Benign prostatic hyperplasia 08/01/2018   Hip pain 06/29/2018   Aortic atherosclerosis (HCC) 09/22/2016   Fatigue 06/03/2016   Lower urinary tract symptoms (LUTS) 06/03/2016   Constipation 03/10/2016   Lumbar radiculopathy 02/20/2016   Encounter for immunization 11/26/2015   Insomnia 06/10/2015   External hemorrhoid 03/07/2015   Health care maintenance 01/09/2014   Carpal tunnel syndrome of left wrist 01/03/2013   Hyperlipidemia 11/09/2012   Vitamin D deficiency 11/09/2012   Essential hypertension 11/06/2012   Tobacco abuse 11/06/2012   Seasonal allergies 11/06/2012   Erectile dysfunction 11/06/2012   GERD (gastroesophageal reflux disease) 11/06/2012   DDD (degenerative disc disease), thoracolumbar 11/06/2012   Lactose intolerance 11/06/2012   Diverticulosis of colon without hemorrhage 11/06/2012    Current Outpatient Medications:    amiodarone (PACERONE) 200 MG tablet, Take 1 tablet (200 mg total) by mouth daily., Disp: 30 tablet, Rfl: 5   apixaban (ELIQUIS) 5 MG TABS tablet, Take 1 tablet (5 mg total) by mouth 2 (two) times daily., Disp: 60 tablet, Rfl: 3   calcium carbonate (OS-CAL - DOSED IN MG OF ELEMENTAL CALCIUM) 1250 (500 Ca) MG tablet, Take 1 tablet (500 mg of elemental calcium total) by mouth daily with breakfast., Disp: 30 tablet, Rfl: 11   carvedilol (COREG) 6.25 MG tablet, Take 1 tablet (6.25 mg total) by mouth 2 (two) times daily with a meal., Disp: 60 tablet, Rfl:  5   Cholecalciferol (VITAMIN D-3) 125 MCG (5000 UT) TABS, Take 1 tablet by mouth daily., Disp: 30 tablet, Rfl: 11   ezetimibe (ZETIA) 10 MG tablet, Take 1 tablet (10 mg total) by mouth daily., Disp: 90 tablet, Rfl: 3   FARXIGA 10 MG TABS tablet, Take 1 tablet (10 mg total) by mouth daily., Disp: 90 tablet, Rfl:  3   ferrous sulfate 325 (65 FE) MG tablet, Take 325 mg by mouth daily with breakfast., Disp: , Rfl:    folic acid (FOLVITE) 1 MG tablet, Take 1 tablet (1 mg total) by mouth daily., Disp: 30 tablet, Rfl: 11   gabapentin (NEURONTIN) 300 MG capsule, TAKE 3 CAPSULES(900 MG) BY MOUTH AT BEDTIME, Disp: 180 capsule, Rfl: 3   methotrexate (RHEUMATREX) 2.5 MG tablet, Take 20 mg (8 tablets) every Wednesday. PROTECT FROM LIGHT AS DIRECTED, Disp: 40 tablet, Rfl: 3   Multiple Vitamin (MULTIVITAMIN) tablet, Take 1 tablet by mouth daily., Disp: , Rfl:    pantoprazole (PROTONIX) 40 MG tablet, Take 1 tablet (40 mg total) by mouth daily. 30 min before food, Disp: 90 tablet, Rfl: 3   predniSONE (DELTASONE) 5 MG tablet, Take 15 mg (3 tablets) daily until 04/06/22, then decreased to prednisone 10 mg (2 tablets) daily. Follow titration protocol as directed., Disp: 270 tablet, Rfl: 1   sacubitril-valsartan (ENTRESTO) 24-26 MG, Take 1 tablet by mouth 2 (two) times daily., Disp: 180 tablet, Rfl: 3   spironolactone (ALDACTONE) 25 MG tablet, Take 1 tablet (25 mg total) by mouth daily., Disp: 30 tablet, Rfl: 6   sulfamethoxazole-trimethoprim (BACTRIM DS) 800-160 MG tablet, TAKE 1 TABLET BY MOUTH EVERY MONDAY, WEDNESDAY, AND FRIDAY (Patient not taking: Reported on 04/07/2022), Disp: 12 tablet, Rfl: 3 Allergies  Allergen Reactions   Crestor [Rosuvastatin Calcium] Other (See Comments)    Elevated muscle enzymes    Lipitor [Atorvastatin Calcium] Other (See Comments)    Elevated muscle enzymes      Social History   Socioeconomic History   Marital status: Married    Spouse name: Sales executive   Number of children: 2   Years of education: Not on file   Highest education level: High school graduate  Occupational History   Occupation: Retired    Comment: Semi  Tobacco Use   Smoking status: Former    Packs/day: 0.50    Types: Cigarettes    Start date: 07/06/1971    Quit date: 06/26/2020    Years since quitting: 1.8    Smokeless tobacco: Never   Tobacco comments:    Former smoker 09/24/21  Vaping Use   Vaping Use: Never used  Substance and Sexual Activity   Alcohol use: No    Alcohol/week: 0.0 standard drinks of alcohol   Drug use: No   Sexual activity: Not on file  Other Topics Concern   Not on file  Social History Narrative   Married    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    On child    1 son, 1 daughter   Machine op   Owns guns    Wears seat belt, safe in relationship    Smoker    Retired 06/27/2019   Social Determinants of Health   Financial Resource Strain: [place]  (08/11/2020)   Overall Financial Resource Strain (CARDIA)    Difficulty of Paying Living Expenses: Not hard at all  Food Insecurity: No Food Insecurity (12/11/2021)   Hunger Vital Sign    Worried About Running Out of Food in the Last Year: Never true  Ran Out of Food in the Last Year: Never true  Transportation Needs: No Transportation Needs (12/11/2021)   PRAPARE - Administrator, Civil Service (Medical): No    Lack of Transportation (Non-Medical): No  Physical Activity: Sufficiently Active (08/11/2020)   Exercise Vital Sign    Days of Exercise per Week: 5 days    Minutes of Exercise per Session: 30 min  Stress: No Stress Concern Present (08/11/2020)   [employer] of Occupational Health - Occupational Stress Questionnaire    Feeling of Stress : Not at all  Social Connections: Unknown (08/11/2020)   Social Connection and Isolation Panel [NHANES]    Frequency of Communication with Friends and Family: More than three times a week    Frequency of Social Gatherings with Friends and Family: More than three times a week    Attends Religious Services: Not on file    Active Member of Clubs or Organizations: Yes    Attends Banker Meetings: Not on file    Marital Status: Married  Intimate Partner Violence: Not At Risk (08/11/2020)   Humiliation, Afraid, Rape, and Kick questionnaire    Fear of Current or Ex-Partner: No     Emotionally Abused: No    Physically Abused: No    Sexually Abused: No    Physical Exam      Future Appointments  Date Time Provider Department Center  05/06/2022 11:00 AM Dana Allan, Keith [facility]  05/20/2022  9:40 AM [name], Bevelyn Buckles, Keith [facility] None  06/11/2022  7:00 AM [facility]ICE REMOTES [facility]  07/22/2022  9:00 AM Freddie Breech, DPM [facility]  09/10/2022  7:00 AM [facility] DEVICE REMOTES [facility]  12/10/2022  7:00 AM [facility] DEVICE REMOTES [facility]  03/11/2023  7:00 AM [facility] DEVICE REMOTES [facility]     ACTION: Home visit completed

## 2022-04-22 DIAGNOSIS — J3489 Other specified disorders of nose and nasal sinuses: Secondary | ICD-10-CM | POA: Diagnosis not present

## 2022-04-28 ENCOUNTER — Other Ambulatory Visit (HOSPITAL_COMMUNITY): Payer: Self-pay

## 2022-04-28 NOTE — Progress Notes (Signed)
Paramedicine Encounter    Patient ID: John Keith, male    DOB: 1951-06-30, 71 y.o.   MRN: [id number]   Arrived for home visit for John Keith who reports to be feeling good with no complaints. He denied chest pain, shortness of breath or dizziness episodes over the last week. He reports no weight gain or swelling either. He also has been 100% medication compliant. I reviewed medications and filled pill boxes for 2 weeks.   I obtained vitals as noted below: WT- 242lbs BP- 100/62 HR- 70 O2- 93% RR- 16   Lungs clear and equal bilaterally.   No lower leg edema noted. Wears compression socks daily.   We reviewed HF diet and fluid restrictions.   We reviewed upcoming appointments and confirmed same.   Home visit planned for two weeks. He agreed with plan. He knows to reach out if needed.   No social needs at this time.   [name], [facility] 04/28/2022   Patient Care Team: [name], [name], MD as PCP - General (Internal Medicine) [name], MD as PCP - Cardiology (Cardiology) [name], MD as PCP - Electrophysiology (Cardiology) [name], MD as Consulting Physician (Internal Medicine)  Patient Active Problem List   Diagnosis Date Noted   Chronic systolic heart failure ([place]) 12/15/2021   Chronic combined systolic and diastolic CHF (congestive heart failure) ([place]) 12/15/2021   Nasal sore 12/15/2021   Syncope 12/07/2021   Ventricular tachycardia ([place]) 12/07/2021   Typical atrial flutter ([place]) 09/24/2021   Secondary hypercoagulable state ([place]) 09/24/2021   Abnormal x-ray of neck 06/16/2021   Abnormal MRI, lumbar spine 10/23/2020   Chronic low back pain 10/23/2020   Neuropathy 10/23/2020   Coronary artery disease of native artery of native heart with stable angina pectoris ([place]) 10/23/2020   Impingement syndrome of left shoulder region 08/22/2020   Pain in joint of left shoulder 07/24/2020   Chronic left shoulder  pain 07/11/2020   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   Anemia 12/13/2019   Chest pain of uncertain etiology 38/45/3646   PVC (premature ventricular contraction) 10/24/2019   Gastric ulcer without hemorrhage or perforation 09/28/2019   COVID-19 virus detected 07/12/2019   Elevated troponin 07/07/2019   Elevated serum protein level 07/07/2019   Postherpetic neuralgia 08/17/2018   Benign prostatic hyperplasia 08/01/2018   Hip pain 06/29/2018   Aortic atherosclerosis ([place]) 09/22/2016   Fatigue 06/03/2016   Lower urinary tract symptoms (LUTS) 06/03/2016   Constipation 03/10/2016   Lumbar radiculopathy 02/20/2016   Encounter for immunization 11/26/2015   Insomnia 06/10/2015   External hemorrhoid 03/07/2015   Health care maintenance 01/09/2014   Carpal tunnel syndrome of left wrist 01/03/2013   Hyperlipidemia 11/09/2012   Vitamin D deficiency 11/09/2012   Essential hypertension 11/06/2012   Tobacco abuse 11/06/2012   Seasonal allergies 11/06/2012   Erectile dysfunction 11/06/2012   GERD (gastroesophageal reflux disease) 11/06/2012   DDD (degenerative disc disease), thoracolumbar 11/06/2012   Lactose intolerance 11/06/2012   Diverticulosis of colon without hemorrhage 11/06/2012    Current Outpatient Medications:    amiodarone (PACERONE) 200 MG tablet, Take 1 tablet (200 mg total) by mouth daily., Disp: 30 tablet, Rfl: 5   apixaban (ELIQUIS) 5 MG TABS tablet, Take 1 tablet (5 mg total) by mouth 2 (two) times daily., Disp: 60 tablet, Rfl: 3   calcium carbonate (OS-CAL - DOSED IN MG OF ELEMENTAL CALCIUM) 1250 (500 Ca) MG tablet, Take 1 tablet (500 mg of elemental calcium total) by mouth daily  with breakfast., Disp: 30 tablet, Rfl: 11   carvedilol (COREG) 6.25 MG tablet, Take 1 tablet (6.25 mg total) by mouth 2 (two) times daily with a meal., Disp: 60 tablet, Rfl: 5   Cholecalciferol (VITAMIN D-3) 125 MCG (5000 UT) TABS, Take 1 tablet by mouth daily., Disp: 30 tablet, Rfl: 11   ezetimibe  (ZETIA) 10 MG tablet, Take 1 tablet (10 mg total) by mouth daily., Disp: 90 tablet, Rfl: 3   FARXIGA 10 MG TABS tablet, Take 1 tablet (10 mg total) by mouth daily., Disp: 90 tablet, Rfl: 3   ferrous sulfate 325 (65 FE) MG tablet, Take 325 mg by mouth daily with breakfast., Disp: , Rfl:    folic acid (FOLVITE) 1 MG tablet, Take 1 tablet (1 mg total) by mouth daily., Disp: 30 tablet, Rfl: 11   gabapentin (NEURONTIN) 300 MG capsule, TAKE 3 CAPSULES(900 MG) BY MOUTH AT BEDTIME, Disp: 180 capsule, Rfl: 3   methotrexate (RHEUMATREX) 2.5 MG tablet, Take 20 mg (8 tablets) every Wednesday. PROTECT FROM LIGHT AS DIRECTED, Disp: 40 tablet, Rfl: 3   Multiple Vitamin (MULTIVITAMIN) tablet, Take 1 tablet by mouth daily., Disp: , Rfl:    pantoprazole (PROTONIX) 40 MG tablet, Take 1 tablet (40 mg total) by mouth daily. 30 min before food, Disp: 90 tablet, Rfl: 3   predniSONE (DELTASONE) 5 MG tablet, Take 15 mg (3 tablets) daily until 04/06/22, then decreased to prednisone 10 mg (2 tablets) daily. Follow titration protocol as directed., Disp: 270 tablet, Rfl: 1   sacubitril-valsartan (ENTRESTO) 24-26 MG, Take 1 tablet by mouth 2 (two) times daily., Disp: 180 tablet, Rfl: 3   spironolactone (ALDACTONE) 25 MG tablet, Take 1 tablet (25 mg total) by mouth daily., Disp: 30 tablet, Rfl: 6   sulfamethoxazole-trimethoprim (BACTRIM DS) 800-160 MG tablet, TAKE 1 TABLET BY MOUTH EVERY MONDAY, WEDNESDAY, AND FRIDAY (Patient not taking: Reported on 04/21/2022), Disp: 12 tablet, Rfl: 3 Allergies  Allergen Reactions   Crestor [Rosuvastatin Calcium] Other (See Comments)    Elevated muscle enzymes    Lipitor [Atorvastatin Calcium] Other (See Comments)    Elevated muscle enzymes      Social History   Socioeconomic History   Marital status: Married    Spouse name: Proofreader   Number of children: 2   Years of education: Not on file   Highest education level: High school graduate  Occupational History   Occupation: Retired     Comment: Semi  Tobacco Use   Smoking status: Former    Packs/day: 0.50    Types: Cigarettes    Start date: 07/06/1971    Quit date: 06/26/2020    Years since quitting: 1.8   Smokeless tobacco: Never   Tobacco comments:    Former smoker 09/24/21  Vaping Use   Vaping Use: Never used  Substance and Sexual Activity   Alcohol use: No    Alcohol/week: 0.0 standard drinks of alcohol   Drug use: No   Sexual activity: Not on file  Other Topics Concern   Not on file  Social History Narrative   Married    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    On child    1 son, 1 daughter   Machine op   Owns guns    Wears seat belt, safe in relationship    Smoker    Retired 06/27/2019   Social Determinants of Health   Financial Resource Strain: Low Risk  (08/11/2020)   Overall Financial Resource Strain (CARDIA)    Difficulty of Paying  Living Expenses: Not hard at all  Food Insecurity: No Food Insecurity (12/11/2021)   Hunger Vital Sign    Worried About Running Out of Food in the Last Year: Never true    Ran Out of Food in the Last Year: Never true  Transportation Needs: No Transportation Needs (12/11/2021)   PRAPARE - Administrator, Civil Service (Medical): No    Lack of Transportation (Non-Medical): No  Physical Activity: Sufficiently Active (08/11/2020)   Exercise Vital Sign    Days of Exercise per Week: 5 days    Minutes of Exercise per Session: 30 min  Stress: No Stress Concern Present (08/11/2020)   [employer] of Occupational Health - Occupational Stress Questionnaire    Feeling of Stress : Not at all  Social Connections: Unknown (08/11/2020)   Social Connection and Isolation Panel [NHANES]    Frequency of Communication with Friends and Family: More than three times a week    Frequency of Social Gatherings with Friends and Family: More than three times a week    Attends Religious Services: Not on file    Active Member of Clubs or Organizations: Yes    Attends Banker Meetings: Not  on file    Marital Status: Married  Intimate Partner Violence: Not At Risk (08/11/2020)   Humiliation, Afraid, Rape, and Kick questionnaire    Fear of Current or Ex-Partner: No    Emotionally Abused: No    Physically Abused: No    Sexually Abused: No    Physical Exam      Future Appointments  Date Time Provider Department Center  05/06/2022 11:00 AM Dana Allan, MD [facility]  05/20/2022  9:40 AM [name], Bevelyn Buckles, MD [facility] None  06/11/2022  7:00 AM [facility]ICE REMOTES [facility]  07/22/2022  9:00 AM Freddie Breech, DPM [facility]  09/10/2022  7:00 AM [facility]ICE REMOTES [facility]  12/10/2022  7:00 AM [facility]  03/11/2023  7:00 AM [facility]     ACTION: Home visit completed

## 2022-05-06 ENCOUNTER — Encounter: Payer: PPO | Admitting: Family Medicine

## 2022-05-12 ENCOUNTER — Other Ambulatory Visit (HOSPITAL_COMMUNITY): Payer: Self-pay

## 2022-05-12 ENCOUNTER — Telehealth (HOSPITAL_COMMUNITY): Payer: Self-pay | Admitting: Licensed Clinical Social Worker

## 2022-05-12 NOTE — Progress Notes (Signed)
Paramedicine Encounter    Patient ID: John Keith, male    DOB: 1950/10/11, 71 y.o.   MRN: [id number]   Arrived for home visit for John Keith who reports feeling well today with no complaints of shortness of breath, dizziness, chest pain, swelling or weight gain. He reports he has been taking his medications over the last two weeks. Both pill boxes were empty indicating same. I reviewed medications and filled pill box for one week. He has already had morning meds today. Vitals were obtained and assessment.   WT- 242lbs BP- 110/70 HR- 58  O2- 95% RR- 16  Lungs- clear Edema- none, wearing compression socks  We reviewed diet- he reports decreased appetite overall but is still eating three meals daily just not as much. He is maintaining his fluid intake to less than 2L daily. He is urinating well. No issues with BM's.   We reviewed upcoming appointments and confirmed same. I plan to meet him at his upcoming clinic visit. He agreed with plan to bring all meds and pill boxes.   Home visit complete.   Meds called into [facility]- Gabapentin Amiodarone Zetia Calcium (OTC)   [name], EMT-Paramedic [phone] 05/12/2022        Patient Care Team: [name], [name], MD as PCP - General (Internal Medicine) [name], MD as PCP - Cardiology (Cardiology) [name], MD as PCP - Electrophysiology (Cardiology) [name], MD as Consulting Physician (Internal Medicine)  Patient Active Problem List   Diagnosis Date Noted   Chronic systolic heart failure ([place]) 12/15/2021   Chronic combined systolic and diastolic CHF (congestive heart failure) ([name]) 12/15/2021   Nasal sore 12/15/2021   Syncope 12/07/2021   Ventricular tachycardia ([name]) 12/07/2021   Typical atrial flutter ([name]) 09/24/2021   Secondary hypercoagulable state ([name]) 09/24/2021   Abnormal x-ray of neck 06/16/2021   Abnormal MRI, lumbar spine 10/23/2020   Chronic low back pain 10/23/2020    Neuropathy 10/23/2020   Coronary artery disease of native artery of native heart with stable angina pectoris ([place]) 10/23/2020   Impingement syndrome of left shoulder region 08/22/2020   Pain in joint of left shoulder 07/24/2020   Chronic left shoulder pain 07/11/2020   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   Anemia 12/13/2019   Chest pain of uncertain etiology 99991111   PVC (premature ventricular contraction) 10/24/2019   Gastric ulcer without hemorrhage or perforation 09/28/2019   COVID-19 virus detected 07/12/2019   Elevated troponin 07/07/2019   Elevated serum protein level 07/07/2019   Postherpetic neuralgia 08/17/2018   Benign prostatic hyperplasia 08/01/2018   Hip pain 06/29/2018   Aortic atherosclerosis ([name]) 09/22/2016   Fatigue 06/03/2016   Lower urinary tract symptoms (LUTS) 06/03/2016   Constipation 03/10/2016   Lumbar radiculopathy 02/20/2016   Encounter for immunization 11/26/2015   Insomnia 06/10/2015   External hemorrhoid 03/07/2015   Health care maintenance 01/09/2014   Carpal tunnel syndrome of left wrist 01/03/2013   Hyperlipidemia 11/09/2012   Vitamin D deficiency 11/09/2012   Essential hypertension 11/06/2012   Tobacco abuse 11/06/2012   Seasonal allergies 11/06/2012   Erectile dysfunction 11/06/2012   GERD (gastroesophageal reflux disease) 11/06/2012   DDD (degenerative disc disease), thoracolumbar 11/06/2012   Lactose intolerance 11/06/2012   Diverticulosis of colon without hemorrhage 11/06/2012    Current Outpatient Medications:    amiodarone (PACERONE) 200 MG tablet, Take 1 tablet (200 mg total) by mouth daily., Disp: 30 tablet, Rfl: 5   apixaban (ELIQUIS) 5 MG TABS tablet, Take 1 tablet (5  mg total) by mouth 2 (two) times daily., Disp: 60 tablet, Rfl: 3   calcium carbonate (OS-CAL - DOSED IN MG OF ELEMENTAL CALCIUM) 1250 (500 Ca) MG tablet, Take 1 tablet (500 mg of elemental calcium total) by mouth daily with breakfast., Disp: 30 tablet, Rfl: 11    carvedilol (COREG) 6.25 MG tablet, Take 1 tablet (6.25 mg total) by mouth 2 (two) times daily with a meal., Disp: 60 tablet, Rfl: 5   Cholecalciferol (VITAMIN D-3) 125 MCG (5000 UT) TABS, Take 1 tablet by mouth daily., Disp: 30 tablet, Rfl: 11   ezetimibe (ZETIA) 10 MG tablet, Take 1 tablet (10 mg total) by mouth daily., Disp: 90 tablet, Rfl: 3   FARXIGA 10 MG TABS tablet, Take 1 tablet (10 mg total) by mouth daily., Disp: 90 tablet, Rfl: 3   ferrous sulfate 325 (65 FE) MG tablet, Take 325 mg by mouth daily with breakfast., Disp: , Rfl:    folic acid (FOLVITE) 1 MG tablet, Take 1 tablet (1 mg total) by mouth daily., Disp: 30 tablet, Rfl: 11   gabapentin (NEURONTIN) 300 MG capsule, TAKE 3 CAPSULES(900 MG) BY MOUTH AT BEDTIME, Disp: 180 capsule, Rfl: 3   methotrexate (RHEUMATREX) 2.5 MG tablet, Take 20 mg (8 tablets) every Wednesday. PROTECT FROM LIGHT AS DIRECTED, Disp: 40 tablet, Rfl: 3   Multiple Vitamin (MULTIVITAMIN) tablet, Take 1 tablet by mouth daily., Disp: , Rfl:    pantoprazole (PROTONIX) 40 MG tablet, Take 1 tablet (40 mg total) by mouth daily. 30 min before food, Disp: 90 tablet, Rfl: 3   predniSONE (DELTASONE) 5 MG tablet, Take 15 mg (3 tablets) daily until 04/06/22, then decreased to prednisone 10 mg (2 tablets) daily. Follow titration protocol as directed., Disp: 270 tablet, Rfl: 1   sacubitril-valsartan (ENTRESTO) 24-26 MG, Take 1 tablet by mouth 2 (two) times daily., Disp: 180 tablet, Rfl: 3   spironolactone (ALDACTONE) 25 MG tablet, Take 1 tablet (25 mg total) by mouth daily., Disp: 30 tablet, Rfl: 6   sulfamethoxazole-trimethoprim (BACTRIM DS) 800-160 MG tablet, TAKE 1 TABLET BY MOUTH EVERY MONDAY, WEDNESDAY, AND FRIDAY (Patient not taking: Reported on 04/21/2022), Disp: 12 tablet, Rfl: 3 Allergies  Allergen Reactions   Crestor [Rosuvastatin Calcium] Other (See Comments)    Elevated muscle enzymes    Lipitor [Atorvastatin Calcium] Other (See Comments)    Elevated muscle enzymes       Social History   Socioeconomic History   Marital status: Married    Spouse name: Proofreader   Number of children: 2   Years of education: Not on file   Highest education level: High school graduate  Occupational History   Occupation: Retired    Comment: Semi  Tobacco Use   Smoking status: Former    Packs/day: 0.50    Types: Cigarettes    Start date: 07/06/1971    Quit date: 06/26/2020    Years since quitting: 1.8   Smokeless tobacco: Never   Tobacco comments:    Former smoker 09/24/21  Vaping Use   Vaping Use: Never used  Substance and Sexual Activity   Alcohol use: No    Alcohol/week: 0.0 standard drinks of alcohol   Drug use: No   Sexual activity: Not on file  Other Topics Concern   Not on file  Social History Narrative   Married    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    On child    1 son, 1 daughter   Systems analyst op   Owns guns    Wears  seat belt, safe in relationship    Smoker    Retired 06/27/2019   Social Determinants of Health   Financial Resource Strain: Low Risk  (08/11/2020)   Overall Financial Resource Strain (CARDIA)    Difficulty of Paying Living Expenses: Not hard at all  Food Insecurity: No Food Insecurity (12/11/2021)   Hunger Vital Sign    Worried About Running Out of Food in the Last Year: Never true    Ran Out of Food in the Last Year: Never true  Transportation Needs: No Transportation Needs (12/11/2021)   PRAPARE - Administrator, Civil Service (Medical): No    Lack of Transportation (Non-Medical): No  Physical Activity: Sufficiently Active (08/11/2020)   Exercise Vital Sign    Days of Exercise per Week: 5 days    Minutes of Exercise per Session: 30 min  Stress: No Stress Concern Present (08/11/2020)   [employer] of Occupational Health - Occupational Stress Questionnaire    Feeling of Stress : Not at all  Social Connections: Unknown (08/11/2020)   Social Connection and Isolation Panel [NHANES]    Frequency of Communication with Friends and Family:  More than three times a week    Frequency of Social Gatherings with Friends and Family: More than three times a week    Attends Religious Services: Not on file    Active Member of Clubs or Organizations: Yes    Attends Banker Meetings: Not on file    Marital Status: Married  Intimate Partner Violence: Not At Risk (08/11/2020)   Humiliation, Afraid, Rape, and Kick questionnaire    Fear of Current or Ex-Partner: No    Emotionally Abused: No    Physically Abused: No    Sexually Abused: No    Physical Exam      Future Appointments  Date Time Provider Department Center  05/20/2022  9:40 AM [name], Bevelyn Buckles, MD [facility] None  06/11/2022  7:00 AM [facility] DEVICE REMOTES [facility]  07/22/2022  9:00 AM Freddie Breech, DPM [facility]  09/10/2022  7:00 AM [facility] DEVICE REMOTES [facility]  12/10/2022  7:00 AM [facility] DEVICE REMOTES [facility]  03/11/2023  7:00 AM [facility] DEVICE REMOTES [facility]     ACTION: Home visit completed

## 2022-05-12 NOTE — Telephone Encounter (Signed)
HF Paramedicine Team Based Care Meeting  HF MD- NA  HF NP - Amy Clegg NP-C   [facility] HF Paramedicine  Katie Vicente Males  [facility] admit within the last 30 days for heart failure? no  Medications concerns? Gets easily confused trying to do medications  Eligible for discharge? Will see how pt is doing at upcoming MD visit and if there will continue to be complex med changes.  Burna Sis, LCSW Clinical Social Worker [facility] Desk#: [phone] Cell#: [phone]

## 2022-05-20 ENCOUNTER — Encounter (HOSPITAL_COMMUNITY): Payer: Self-pay | Admitting: Internal Medicine

## 2022-05-20 ENCOUNTER — Ambulatory Visit (HOSPITAL_COMMUNITY)
Admission: RE | Admit: 2022-05-20 | Discharge: 2022-05-20 | Disposition: A | Discharge status: Home / Self Care | Payer: PPO | Source: Ambulatory Visit | Attending: Internal Medicine | Admitting: Internal Medicine

## 2022-05-20 ENCOUNTER — Other Ambulatory Visit (HOSPITAL_COMMUNITY): Payer: Self-pay

## 2022-05-20 ENCOUNTER — Telehealth (HOSPITAL_COMMUNITY): Payer: Self-pay

## 2022-05-20 VITALS — BP 110/84 | HR 66 | Wt 248.8 lb

## 2022-05-20 DIAGNOSIS — I5042 Chronic combined systolic (congestive) and diastolic (congestive) heart failure: Secondary | ICD-10-CM | POA: Insufficient documentation

## 2022-05-20 DIAGNOSIS — I472 Ventricular tachycardia, unspecified: Secondary | ICD-10-CM | POA: Diagnosis not present

## 2022-05-20 DIAGNOSIS — D8685 Sarcoid myocarditis: Secondary | ICD-10-CM | POA: Diagnosis not present

## 2022-05-20 LAB — COMPREHENSIVE METABOLIC PANEL
ALT: 29 U/L (ref 0–44)
AST: 27 U/L (ref 15–41)
Albumin: 4 g/dL (ref 3.5–5.0)
Alkaline Phosphatase: 78 U/L (ref 38–126)
Anion gap: 7 (ref 5–15)
BUN: 16 mg/dL (ref 8–23)
CO2: 25 mmol/L (ref 22–32)
Calcium: 9.2 mg/dL (ref 8.9–10.3)
Chloride: 107 mmol/L (ref 98–111)
Creatinine, Ser: 1.27 mg/dL — ABNORMAL HIGH (ref 0.61–1.24)
GFR, Estimated: >60 mL/min (ref >60)
Glucose, Bld: 96 mg/dL (ref 70–99)
Potassium: 4 mmol/L (ref 3.5–5.1)
Sodium: 139 mmol/L (ref 135–145)
Total Bilirubin: 0.7 mg/dL (ref 0.3–1.2)
Total Protein: 7.2 g/dL (ref 6.5–8.1)

## 2022-05-20 LAB — CBC
HCT: 41.4 % (ref 39.0–52.0)
Hemoglobin: 13.3 g/dL (ref 13.0–17.0)
MCH: 32.7 pg (ref 26.0–34.0)
MCHC: 32.1 g/dL (ref 30.0–36.0)
MCV: 101.7 fL — ABNORMAL HIGH (ref 80.0–100.0)
Platelets: 270 10*3/uL (ref 150–400)
RBC: 4.07 MIL/uL — ABNORMAL LOW (ref 4.22–5.81)
RDW: 13.6 % (ref 11.5–15.5)
WBC: 6.3 10*3/uL (ref 4.0–10.5)
nRBC: 0 % (ref 0.0–0.2)

## 2022-05-20 LAB — BRAIN NATRIURETIC PEPTIDE: B Natriuretic Peptide: 76.6 pg/mL (ref 0.0–100.0)

## 2022-05-20 NOTE — Progress Notes (Signed)
Paramedicine Encounter    Patient ID: John Keith, male    DOB: 08-03-1950, 71 y.o.   MRN: [id number]   Met with [name] in clinic today where he was seen by Dr. Haroldine Laws. [place] denied any complaints. He denied any recent swelling, chest pain, shortness of breath or dizziness. He was 100% med compliant over the last week. He is now down to 2.8m of Prednisone. Meds reviewed and confirmed. Pill box filled for one week. I reviewed refills and called in same to [facility]: -Zetia -Methotrexate   No med changes today- good report from Dr. BHaroldine Laws  Reminded to maintain his fluid intake and sodium intake. Continue with compression socks while working.   He agreed with plan and is good with home visit in one week.   I obtained Eliquis application signatures and will turn in for re-enrollment.   Visit complete.   HSalena Saner [place]11/16/2023   Patient Care Team: [name], TNino Glow MD as PCP - General (Internal Medicine) RSkeet Latch MD as PCP - Cardiology (Cardiology) CConstance Haw MD as PCP - Electrophysiology (Cardiology) BBinnie Rail MD as Consulting Physician (Internal Medicine)  Patient Active Problem List   Diagnosis Date Noted   Chronic systolic heart failure ([place] 12/15/2021   Chronic combined systolic and diastolic CHF (congestive heart failure) ([place] 12/15/2021   Nasal sore 12/15/2021   Syncope 12/07/2021   Ventricular tachycardia ([place] 12/07/2021   Typical atrial flutter ([place] 09/24/2021   Secondary hypercoagulable state ([place] 09/24/2021   Abnormal x-ray of neck 06/16/2021   Abnormal MRI, lumbar spine 10/23/2020   Chronic low back pain 10/23/2020   Neuropathy 10/23/2020   Coronary artery disease of native artery of native heart with stable angina pectoris ([place] 10/23/2020   Impingement syndrome of left shoulder region 08/22/2020   Pain in joint of left shoulder 07/24/2020   Chronic left shoulder pain  07/11/2020   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   Anemia 12/13/2019   Chest pain of uncertain etiology 096/22/2979  PVC (premature ventricular contraction) 10/24/2019   Gastric ulcer without hemorrhage or perforation 09/28/2019   COVID-19 virus detected 07/12/2019   Elevated troponin 07/07/2019   Elevated serum protein level 07/07/2019   Postherpetic neuralgia 08/17/2018   Benign prostatic hyperplasia 08/01/2018   Hip pain 06/29/2018   Aortic atherosclerosis ([place] 09/22/2016   Fatigue 06/03/2016   Lower urinary tract symptoms (LUTS) 06/03/2016   Constipation 03/10/2016   Lumbar radiculopathy 02/20/2016   Encounter for immunization 11/26/2015   Insomnia 06/10/2015   External hemorrhoid 03/07/2015   Health care maintenance 01/09/2014   Carpal tunnel syndrome of left wrist 01/03/2013   Hyperlipidemia 11/09/2012   Vitamin D deficiency 11/09/2012   Essential hypertension 11/06/2012   Tobacco abuse 11/06/2012   Seasonal allergies 11/06/2012   Erectile dysfunction 11/06/2012   GERD (gastroesophageal reflux disease) 11/06/2012   DDD (degenerative disc disease), thoracolumbar 11/06/2012   Lactose intolerance 11/06/2012   Diverticulosis of colon without hemorrhage 11/06/2012    Current Outpatient Medications:    amiodarone (PACERONE) 200 MG tablet, Take 1 tablet (200 mg total) by mouth daily., Disp: 30 tablet, Rfl: 5   apixaban (ELIQUIS) 5 MG TABS tablet, Take 1 tablet (5 mg total) by mouth 2 (two) times daily., Disp: 60 tablet, Rfl: 3   calcium carbonate (OS-CAL - DOSED IN MG OF ELEMENTAL CALCIUM) 1250 (500 Ca) MG tablet, Take 1 tablet (500 mg of elemental calcium total) by mouth daily with breakfast., Disp: 30 tablet, Rfl: 11  carvedilol (COREG) 6.25 MG tablet, Take 1 tablet (6.25 mg total) by mouth 2 (two) times daily with a meal., Disp: 60 tablet, Rfl: 5   Cholecalciferol (VITAMIN D-3) 125 MCG (5000 UT) TABS, Take 1 tablet by mouth daily., Disp: 30 tablet, Rfl: 11   ezetimibe (ZETIA)  10 MG tablet, Take 1 tablet (10 mg total) by mouth daily., Disp: 90 tablet, Rfl: 3   FARXIGA 10 MG TABS tablet, Take 1 tablet (10 mg total) by mouth daily., Disp: 90 tablet, Rfl: 3   ferrous sulfate 325 (65 FE) MG tablet, Take 325 mg by mouth daily with breakfast., Disp: , Rfl:    folic acid (FOLVITE) 1 MG tablet, Take 1 tablet (1 mg total) by mouth daily., Disp: 30 tablet, Rfl: 11   gabapentin (NEURONTIN) 300 MG capsule, Take 300 mg by mouth daily., Disp: , Rfl:    methotrexate (RHEUMATREX) 2.5 MG tablet, Take 20 mg (8 tablets) every Wednesday. PROTECT FROM LIGHT AS DIRECTED, Disp: 40 tablet, Rfl: 3   Multiple Vitamin (MULTIVITAMIN) tablet, Take 1 tablet by mouth daily., Disp: , Rfl:    pantoprazole (PROTONIX) 40 MG tablet, Take 1 tablet (40 mg total) by mouth daily. 30 min before food, Disp: 90 tablet, Rfl: 3   sacubitril-valsartan (ENTRESTO) 24-26 MG, Take 1 tablet by mouth 2 (two) times daily., Disp: 180 tablet, Rfl: 3   spironolactone (ALDACTONE) 25 MG tablet, Take 1 tablet (25 mg total) by mouth daily., Disp: 30 tablet, Rfl: 6 Allergies  Allergen Reactions   Crestor [Rosuvastatin Calcium] Other (See Comments)    Elevated muscle enzymes    Lipitor [Atorvastatin Calcium] Other (See Comments)    Elevated muscle enzymes      Social History   Socioeconomic History   Marital status: Married    Spouse name: John Keith   Number of children: 2   Years of education: Not on file   Highest education level: High school graduate  Occupational History   Occupation: Retired    Comment: Semi  Tobacco Use   Smoking status: Former    Packs/day: 0.50    Types: Cigarettes    Start date: 07/06/1971    Quit date: 06/26/2020    Years since quitting: 1.8   Smokeless tobacco: Never   Tobacco comments:    Former smoker 09/24/21  Vaping Use   Vaping Use: Never used  Substance and Sexual Activity   Alcohol use: No    Alcohol/week: 0.0 standard drinks of alcohol   Drug use: No   Sexual activity:  Not on file  Other Topics Concern   Not on file  Social History Narrative   Married    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    On child    1 son, 1 daughter   Social worker op   Owns guns    Wears seat belt, safe in relationship    Smoker    Retired 06/27/2019   Social Determinants of Health   Financial Resource Strain: [place]  (08/11/2020)   Overall Financial Resource Strain (CARDIA)    Difficulty of Paying Living Expenses: Not hard at all  Food Insecurity: No Food Insecurity (12/11/2021)   Hunger Vital Sign    Worried About Running Out of Food in the Last Year: Never true    [place] in the Last Year: Never true  Transportation Needs: No Transportation Needs (12/11/2021)   PRAPARE - Hydrologist (Medical): No    Lack of Transportation (Non-Medical): No  Physical Activity: Sufficiently Active (08/11/2020)   Exercise Vital Sign    Days of Exercise per Week: 5 days    Minutes of Exercise per Session: 30 min  Stress: No Stress Concern Present (08/11/2020)   [place]    Feeling of Stress : Not at all  Social Connections: Unknown (08/11/2020)   Social Connection and Isolation Panel [NHANES]    Frequency of Communication with Friends and Family: More than three times a week    Frequency of Social Gatherings with Friends and Family: More than three times a week    Attends Religious Services: Not on file    Active Member of Clubs or Organizations: Yes    Attends Archivist Meetings: Not on file    Marital Status: Married  Intimate Partner Violence: Not At Risk (08/11/2020)   Humiliation, Afraid, Rape, and Kick questionnaire    Fear of Current or Ex-Partner: No    Emotionally Abused: No    Physically Abused: No    Sexually Abused: No    Physical Exam      Future Appointments  Date Time Provider [place]  06/11/2022  7:00 AM [facility] DEVICE REMOTES [facility]  07/22/2022   9:00 AM Marzetta Board, DPM [facility]  09/10/2022  7:00 AM [facility] DEVICE REMOTES [facility]  12/10/2022  7:00 AM [facility] DEVICE REMOTES [facility]  03/11/2023  7:00 AM [facility] DEVICE REMOTES [facility]     ACTION: Home visit completed

## 2022-05-20 NOTE — Telephone Encounter (Signed)
Spoke to John Keith and confirmed his appointment today at 9:40. I will meet him there. Call complete.   [name], EMT-Paramedic [phone] 05/20/2022

## 2022-05-20 NOTE — Progress Notes (Signed)
ADVANCED HF CLINIC NOTE   Primary Care: [name], Pasty Spillers, MD HF Cardiologist: Dr. Gala Romney  HPI: 71 y.o. 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    He established with Afib clinic in March for atrial flutter. He underwent successful DCCV 04/17. Despite restoration of SR he noted recurrent palpitations, lightheadedness and chest discomfort at f/u 05/15. 14 day Zio placed. Seen in Cardiology clinic 12/03/21 with palpitations, recurrent near syncope and a syncopal episode. BP meds reduced. Read on 14 day Zio was expedited and showed sustained episodes of VT (longest 1 minute 30 seconds).    Patient was referred to ED for admission d/t recurrent VT on monitor. EP was consulted. Echo showed EF 35-40%, grade II DD, RV mildly reduced, RVSP 27 mmHg. Cardiac MRI: EF 33%, RVEF 40%, with multifocal areas of LGE with high intensity in basal septal LGE, elevated T2 values in lateral wall concerning for myocardial inflammation, concerning for sarcoidosis. LHC showed mild nonobstructive CAD. He was started on amiodarone and underwent ICD placement 12/10/21. Workup including PET for cardiac sarcoid recommended as outpatient.   NM cardiac amyloid tumor LOC: Visual and quantitative assessment (grade 1, H/CL equal 1.28) is equivocally suggestive of transthyretin amyloidosis.   Follow up 7/23 with Dr. Gala Romney. NYHA II, volume stable. Low dose spiro started.   Cardiac PET at [facility] (8/23) without evidence of cardiac sarcoidosis or inflammation .   Today he returns for HF follow up with his wife and Herbert Seta with paramedicine. Overall feeling fine. He is on MTX 20 mg and pred 2.5 mg daily. Off bactrim. Feels good. Works on [employer] 6 hours/day. Denies CP or SOB. No palpitations. No ICD firings.   Cardiac Studies:  - Cardiac PET at [facility] (8/23) EF 43% without evidence of cardiac sarcoidosis or  inflammation .   - [facility] (6/23): Visual and quantitative assessment (grade 1, H/CL equal 1.28) is equivocally suggestive of transthyretin amyloidosis.  - LHC (6/23): mild, non-obs CAD.    - cMRI (6/23): LVEF 33%, RVEF 40%, with multifocal areas of LGE with high intensity in basal septal LGE, elevated T2 values in lateral wall concerning for myocardial inflammation, concerning for sarcoidosis.   - Echo (6/23): EF 35-40%, grade II DD, RV mildly reduced, RVSP 27 mmHg.  - Zio 14 day (6/23): mostly NSR, multiple episodes of wide-complex tachycardia due to VT  Past Medical History:  Diagnosis Date   Allergy    Arthritis    Atrial flutter (HCC)    Carpal tunnel syndrome on left    Chest pain of uncertain etiology 10/24/2019   COVID-19    07/07/19   DDD (degenerative disc disease), thoracolumbar    multilevel   Degenerative joint disease of left shoulder    02/2011    Diverticulosis    left colon (2008)    ED (erectile dysfunction)    02/2011    GERD (gastroesophageal reflux disease)    H. pylori infection    Hyperlipidemia    Hypertension    Insomnia    Lactose intolerance    Low back pain    PVC (premature ventricular contraction) 10/24/2019   Shingles    07/2018   Smoking    smoking since age 11 y.o   Toe fracture, left    4th in 2014   Vitamin D deficiency    Current Outpatient Medications  Medication Sig Dispense Refill  amiodarone (PACERONE) 200 MG tablet Take 1 tablet (200 mg total) by mouth daily. 30 tablet 5   apixaban (ELIQUIS) 5 MG TABS tablet Take 1 tablet (5 mg total) by mouth 2 (two) times daily. 60 tablet 3   calcium carbonate (OS-CAL - DOSED IN MG OF ELEMENTAL CALCIUM) 1250 (500 Ca) MG tablet Take 1 tablet (500 mg of elemental calcium total) by mouth daily with breakfast. 30 tablet 11   carvedilol (COREG) 6.25 MG tablet Take 1 tablet (6.25 mg total) by mouth 2 (two) times daily with a meal. 60 tablet 5   Cholecalciferol (VITAMIN D-3) 125 MCG (5000 UT) TABS Take 1  tablet by mouth daily. 30 tablet 11   ezetimibe (ZETIA) 10 MG tablet Take 1 tablet (10 mg total) by mouth daily. 90 tablet 3   FARXIGA 10 MG TABS tablet Take 1 tablet (10 mg total) by mouth daily. 90 tablet 3   ferrous sulfate 325 (65 FE) MG tablet Take 325 mg by mouth daily with breakfast.     folic acid (FOLVITE) 1 MG tablet Take 1 tablet (1 mg total) by mouth daily. 30 tablet 11   gabapentin (NEURONTIN) 300 MG capsule Take 300 mg by mouth daily.     methotrexate (RHEUMATREX) 2.5 MG tablet Take 20 mg (8 tablets) every Wednesday. PROTECT FROM LIGHT AS DIRECTED 40 tablet 3   Multiple Vitamin (MULTIVITAMIN) tablet Take 1 tablet by mouth daily.     pantoprazole (PROTONIX) 40 MG tablet Take 1 tablet (40 mg total) by mouth daily. 30 min before food 90 tablet 3   sacubitril-valsartan (ENTRESTO) 24-26 MG Take 1 tablet by mouth 2 (two) times daily. 180 tablet 3   spironolactone (ALDACTONE) 25 MG tablet Take 1 tablet (25 mg total) by mouth daily. 30 tablet 6   No current facility-administered medications for this encounter.   Allergies  Allergen Reactions   Crestor [Rosuvastatin Calcium] Other (See Comments)    Elevated muscle enzymes    Lipitor [Atorvastatin Calcium] Other (See Comments)    Elevated muscle enzymes    Social History   Socioeconomic History   Marital status: Married    Spouse name: Gwendolyn   Number of children: 2   Years of education: Not on file   Highest education level: High school graduate  Occupational History   Occupation: Retired    Comment: Semi  Tobacco Use   Smoking status: Former    Packs/day: 0.50    Types: Cigarettes    Start date: 07/06/1971    Quit date: 06/26/2020    Years since quitting: 1.8   Smokeless tobacco: Never   Tobacco comments:    Former smoker 09/24/21  Vaping Use   Vaping Use: Never used  Substance and Sexual Activity   Alcohol use: No    Alcohol/week: 0.0 standard drinks of alcohol   Drug use: No   Sexual activity: Not on file   Other Topics Concern   Not on file  Social History Narrative   Married    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    On child    1 son, 1 daughter   Machine op   Owns guns    Wears seat belt, safe in relationship    Smoker    Retired 06/27/2019   Social Determinants of Health   Financial Resource Strain: Low Risk  (08/11/2020)   Overall Financial Resource Strain (CARDIA)    Difficulty of Paying Living Expenses: Not hard at all  Food Insecurity: No Food Insecurity (12/11/2021)  Hunger Vital Sign    Worried About Running Out of Food in the Last Year: Never true    Ran Out of Food in the Last Year: Never true  Transportation Needs: No Transportation Needs (12/11/2021)   PRAPARE - Administrator, Civil Service (Medical): No    Lack of Transportation (Non-Medical): No  Physical Activity: Sufficiently Active (08/11/2020)   Exercise Vital Sign    Days of Exercise per Week: 5 days    Minutes of Exercise per Session: 30 min  Stress: No Stress Concern Present (08/11/2020)   [employer] of Occupational Health - Occupational Stress Questionnaire    Feeling of Stress : Not at all  Social Connections: Unknown (08/11/2020)   Social Connection and Isolation Panel [NHANES]    Frequency of Communication with Friends and Family: More than three times a week    Frequency of Social Gatherings with Friends and Family: More than three times a week    Attends Religious Services: Not on file    Active Member of Clubs or Organizations: Yes    Attends Banker Meetings: Not on file    Marital Status: Married  Intimate Partner Violence: Not At Risk (08/11/2020)   Humiliation, Afraid, Rape, and Kick questionnaire    Fear of Current or Ex-Partner: No    Emotionally Abused: No    Physically Abused: No    Sexually Abused: No   Family History  Problem Relation Age of Onset   Heart disease Mother        age 77    Throat cancer Father 56   Cancer Father        throat    Cancer Maternal Uncle         uncle ?maternal or paternal died colon cancer in his 30s    Diabetes Maternal Aunt    Heart attack Maternal Grandmother   - His mother and grandmother had heart issues, uncertain if they had CHF.  BP 110/84   Pulse 66   Wt 112.9 kg (248 lb 12.8 oz)   SpO2 97%   BMI 26.66 kg/m   Wt Readings from Last 3 Encounters:  05/20/22 112.9 kg (248 lb 12.8 oz)  05/12/22 109.8 kg (242 lb)  04/28/22 109.8 kg (242 lb)   PHYSICAL EXAM: General:  Well appearing. No resp difficulty HEENT: normal Neck: supple. no JVD. Carotids 2+ bilat; no bruits. No lymphadenopathy or thryomegaly appreciated. Cor: PMI nondisplaced. Regular rate & rhythm. No rubs, gallops or murmurs. Lungs: clear Abdomen: soft, nontender, nondistended. No hepatosplenomegaly. No bruits or masses. Good bowel sounds. Extremities: no cyanosis, clubbing, rash, edema Neuro: alert & orientedx3, cranial nerves grossly intact. moves all 4 extremities w/o difficulty. Affect pleasant   MDT ICD interrogation (personally reviewed): No VT/AF. Activity 2-3 hrs/day. Optivol ok Personally reviewed   ASSESSMENT & PLAN: Chronic systolic CHF/NICM: - Echo (6/23): EF 35-40%, grade II DD, RV mildly reduced, RVSP 27 mmHg. - Cardiac MRI (6/23): EF 33%, RVEF 40%, with multifocal areas of LGE with high intensity in basal septal LGE, elevated T2 values in lateral wall concerning for myocardial inflammation. Findings concerning for sarcoidosis.  - LHC (6/23): mild nonobstructive CAD. - Suspect etiology cardiac sarcoidosis (most likely) vs hereditary LMNA cardiomyopathy. - Cardiac PET at [facility] (8/23) showed no evidence of cardiac sarcoidosis. EF 43% - Doing well. NYHA II. Volume looks good on exam, and on OptiVol. - Continue spironolactone to 25 mg daily.  - Continue Coreg 6.25 mg bid. - Continue  Entresto 24/26 mg bid. - Continue Farxiga 10 mg daily. - Continue Paramedicine support. - SBP 100-110 so will not titrate - Labs today  - Will ned repeat PET  after off steroids for at least 1 month   - [facility] Immunosuppressive Therapy for Cardiac Sarcoid / Advanced Heart Failure at [facility]    Prednisone  Weeks 1-4: 30 mg daily  Weeks 5-8 : 25 mg daily  Weeks 9-12: 20 mg daily  Weeks 13-16: 15 mg daily Weeks 17-18: 10 mg daily; d/c Bactrim Weeks 19-20: 7.5 mg daily  Weeks 21-22: 5 mg daily Weeks 23-24: 2.5 mg daily  Weeks 25: stop prednisone    Methotrexate Weeks 1&2: 10 mg (four 2.5 mg tablets) once weekly Weeks 3&4: 12.5 mg (five 2.5 mg tablets) once weekly Weeks 5&6: 15 mg (six 2.5 mg tablets) once weekly  Weeks 7&8: 17.5 mg (seven 2.5 mg tablets) once weekly Weeks 9+: 20 mg (eight 2.5 mg tablets ) once weekly    Additional Medications  Bactrim DS : 1 tablet Mon-Wed- Fri while taking >15 mg of prednisone daily - now of Folic Acid : 1 mg daily  Calcium: 1200-1500 mg daily  Vitamin D: [phone] IU total daily  Omeprazole: 20 mg daily while taking any dose of prednisone.    Monitoring    CBC with diff, AST, ALT, creatinine, and fasting blood glucose             Month 1             Month 2             Month 3             Every 3 months thereafter   2. VT/Hx syncope: - Sustained VT on Zio monitor - Followed by EP. - Continue amiodarone 200 mg daily. - No driving X 6 months (until 12/23). - ICD interrogated personally as above  - TSH (6/23) OK.   3. Atrial flutter: - Regular on exam today. - CHA2DS2-VASc of at least 3.   - On amiodarone as above.  - Continue Eliquis 5 bid. No bleeding   4. CAD: - Mild on LHC 06/23. - No s/s angina - No aspirin with anticoagulation.  - Continue Zetia. Has history of elevated LFTs with statins.  5. HTN: - SBP 100-110 at home  Arvilla Meres, MD  9:54 AM

## 2022-05-20 NOTE — Patient Instructions (Signed)
Good to see you today!   No change to your medications   Lab work done today we will call with any abnormal results   Your physician recommends that you schedule a follow-up appointment in: 4 months(March 2024) Call office in February 2024 to schedule an appointment  If you have any questions or concerns before your next appointment please send [place] a message through [place] or call our office at [phone].    TO LEAVE A MESSAGE FOR THE NURSE SELECT OPTION 2, PLEASE LEAVE A MESSAGE INCLUDING: YOUR NAME DATE OF BIRTH CALL BACK NUMBER REASON FOR CALL**this is important as we prioritize the call backs  YOU WILL RECEIVE A CALL BACK THE SAME DAY AS LONG AS YOU CALL BEFORE 4:00 PM  At the [facility], you and your health needs are our priority. As part of our continuing mission to provide you with exceptional heart care, we have created designated Provider Care Teams. These Care Teams include your primary Cardiologist (physician) and Advanced Practice Providers (APPs- Physician Assistants and Nurse Practitioners) who all work together to provide you with the care you need, when you need it.   You may see any of the following providers on your designated Care Team at your next follow up: Dr Arvilla Meres Dr Marca Ancona Dr. Marcos Eke, NP Robbie Lis, [place] [facility] [place], [place] Brynda Peon, NP Karle Plumber, PharmD   Please be sure to bring in all your medications bottles to every appointment.

## 2022-05-25 ENCOUNTER — Telehealth (HOSPITAL_COMMUNITY): Payer: Self-pay

## 2022-05-25 NOTE — Telephone Encounter (Signed)
Advanced Heart Failure Patient Advocate Encounter  Received renewal notification for [employer]). This patient is also currently taking Farxiga (AZ&ME) and is eligible for a grant that is currently open and would cover the cost of both medications.   No answer, voicemail not set up. Will try back.   Burnell Blanks, CPhT Rx Patient Advocate Phone: [phone]

## 2022-05-26 ENCOUNTER — Other Ambulatory Visit (HOSPITAL_COMMUNITY): Payer: Self-pay

## 2022-05-26 ENCOUNTER — Other Ambulatory Visit (HOSPITAL_COMMUNITY): Payer: Self-pay | Admitting: *Deleted

## 2022-05-26 MED ORDER — FARXIGA 10 MG PO TABS
10.0000 mg | ORAL_TABLET | Freq: Every day | ORAL | 3 refills | Status: DC
  Filled 2022-05-26: qty 90, 90d supply, fill #0
  Filled 2023-04-14: qty 90, 90d supply, fill #1
  Filled 2023-05-03: qty 90, 90d supply, fill #2

## 2022-05-26 MED ORDER — SACUBITRIL-VALSARTAN 24-26 MG PO TABS
1.0000 | ORAL_TABLET | Freq: Two times a day (BID) | ORAL | 3 refills | Status: DC
  Filled 2022-05-26: qty 180, 90d supply, fill #0
  Filled 2022-10-27: qty 180, 90d supply, fill #1
  Filled 2023-01-27: qty 180, 90d supply, fill #2
  Filled 2023-05-03 – 2023-05-12 (×3): qty 180, 90d supply, fill #3

## 2022-05-26 NOTE — Telephone Encounter (Signed)
Advanced Heart Failure Patient Advocate Encounter  The patient was approved for a [employer] grant that will help cover the cost of Entresto, Jardiance.  Total amount awarded, $10,000.  Effective: 04/26/22 - 04/26/23.  BIN [id number] PCN [id number] [employer] [id number] ID [id number]  New prescription(s) sent to [facility]. Patient provided with approval and processing information via USPS.  Burnell Blanks, CPhT Rx Patient Advocate Phone: [phone]

## 2022-05-26 NOTE — Progress Notes (Signed)
Paramedicine Encounter    Patient ID: John Keith, male    DOB: 07/12/1950, 71 y.o.   MRN: [id number]   Arrived for home visit for John Keith who reports to be feeling good with no complaints. He states that he has had no shortness of breath, chest pain, dizziness, swelling, weight gain or missed doses of medications. I obtained vitals and assessment.   WT- 239lbs BP- 118/70 HR- 64 O2- 96% RR- 16  Lungs- clear Edema- none   I reviewed meds and confirmed same filling pill box for one week. Refills called into [facility]: -Spironolactone   I reviewed HF management, diet, fluid restrictions and to be cautious of upcoming holiday and foods containing increased salt. He verbalized understanding and what signs to look for potential fluid overload.   Appointments reviewed and confirmed. Home visit complete. I will plan to see John Keith in one week.   ELIQUIS RE-ENROLLMENT: -I will need OOP and POI which I will obtain next week from John Keith to submit.   [name], EMT-Paramedic [phone] 05/26/2022     Patient Care Team: [name], Pasty Spillers, MD as PCP - General (Internal Medicine) Chilton Si, MD as PCP - Cardiology (Cardiology) Regan Lemming, MD as PCP - Electrophysiology (Cardiology) Pincus Sanes, MD as Consulting Physician (Internal Medicine)  Patient Active Problem List   Diagnosis Date Noted   Chronic systolic heart failure (HCC) 12/15/2021   Chronic combined systolic and diastolic CHF (congestive heart failure) (HCC) 12/15/2021   Nasal sore 12/15/2021   Syncope 12/07/2021   Ventricular tachycardia (HCC) 12/07/2021   Typical atrial flutter (HCC) 09/24/2021   Secondary hypercoagulable state (HCC) 09/24/2021   Abnormal x-ray of neck 06/16/2021   Abnormal MRI, lumbar spine 10/23/2020   Chronic low back pain 10/23/2020   Neuropathy 10/23/2020   Coronary artery disease of native artery of native heart with stable angina pectoris (HCC) 10/23/2020    Impingement syndrome of left shoulder region 08/22/2020   Pain in joint of left shoulder 07/24/2020   Chronic left shoulder pain 07/11/2020   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   Anemia 12/13/2019   Chest pain of uncertain etiology 10/24/2019   PVC (premature ventricular contraction) 10/24/2019   Gastric ulcer without hemorrhage or perforation 09/28/2019   COVID-19 virus detected 07/12/2019   Elevated troponin 07/07/2019   Elevated serum protein level 07/07/2019   Postherpetic neuralgia 08/17/2018   Benign prostatic hyperplasia 08/01/2018   Hip pain 06/29/2018   Aortic atherosclerosis (HCC) 09/22/2016   Fatigue 06/03/2016   Lower urinary tract symptoms (LUTS) 06/03/2016   Constipation 03/10/2016   Lumbar radiculopathy 02/20/2016   Encounter for immunization 11/26/2015   Insomnia 06/10/2015   External hemorrhoid 03/07/2015   Health care maintenance 01/09/2014   Carpal tunnel syndrome of left wrist 01/03/2013   Hyperlipidemia 11/09/2012   Vitamin D deficiency 11/09/2012   Essential hypertension 11/06/2012   Tobacco abuse 11/06/2012   Seasonal allergies 11/06/2012   Erectile dysfunction 11/06/2012   GERD (gastroesophageal reflux disease) 11/06/2012   DDD (degenerative disc disease), thoracolumbar 11/06/2012   Lactose intolerance 11/06/2012   Diverticulosis of colon without hemorrhage 11/06/2012    Current Outpatient Medications:    amiodarone (PACERONE) 200 MG tablet, Take 1 tablet (200 mg total) by mouth daily., Disp: 30 tablet, Rfl: 5   apixaban (ELIQUIS) 5 MG TABS tablet, Take 1 tablet (5 mg total) by mouth 2 (two) times daily., Disp: 60 tablet, Rfl: 3   calcium carbonate (OS-CAL - DOSED IN MG OF ELEMENTAL CALCIUM) 1250 (500  Ca) MG tablet, Take 1 tablet (500 mg of elemental calcium total) by mouth daily with breakfast., Disp: 30 tablet, Rfl: 11   carvedilol (COREG) 6.25 MG tablet, Take 1 tablet (6.25 mg total) by mouth 2 (two) times daily with a meal., Disp: 60 tablet, Rfl: 5    Cholecalciferol (VITAMIN D-3) 125 MCG (5000 UT) TABS, Take 1 tablet by mouth daily., Disp: 30 tablet, Rfl: 11   ezetimibe (ZETIA) 10 MG tablet, Take 1 tablet (10 mg total) by mouth daily., Disp: 90 tablet, Rfl: 3   FARXIGA 10 MG TABS tablet, Take 1 tablet (10 mg total) by mouth daily., Disp: 90 tablet, Rfl: 3   ferrous sulfate 325 (65 FE) MG tablet, Take 325 mg by mouth daily with breakfast., Disp: , Rfl:    folic acid (FOLVITE) 1 MG tablet, Take 1 tablet (1 mg total) by mouth daily., Disp: 30 tablet, Rfl: 11   gabapentin (NEURONTIN) 300 MG capsule, Take 300 mg by mouth daily., Disp: , Rfl:    methotrexate (RHEUMATREX) 2.5 MG tablet, Take 20 mg (8 tablets) every Wednesday. PROTECT FROM LIGHT AS DIRECTED, Disp: 40 tablet, Rfl: 3   Multiple Vitamin (MULTIVITAMIN) tablet, Take 1 tablet by mouth daily., Disp: , Rfl:    pantoprazole (PROTONIX) 40 MG tablet, Take 1 tablet (40 mg total) by mouth daily. 30 min before food, Disp: 90 tablet, Rfl: 3   sacubitril-valsartan (ENTRESTO) 24-26 MG, Take 1 tablet by mouth 2 (two) times daily., Disp: 180 tablet, Rfl: 3   spironolactone (ALDACTONE) 25 MG tablet, Take 1 tablet (25 mg total) by mouth daily., Disp: 30 tablet, Rfl: 6 Allergies  Allergen Reactions   Crestor [Rosuvastatin Calcium] Other (See Comments)    Elevated muscle enzymes    Lipitor [Atorvastatin Calcium] Other (See Comments)    Elevated muscle enzymes      Social History   Socioeconomic History   Marital status: Married    Spouse name: John Keith   Number of children: 2   Years of education: Not on file   Highest education level: High school graduate  Occupational History   Occupation: Retired    Comment: Semi  Tobacco Use   Smoking status: Former    Packs/day: 0.50    Types: Cigarettes    Start date: 07/06/1971    Quit date: 06/26/2020    Years since quitting: 1.9   Smokeless tobacco: Never   Tobacco comments:    Former smoker 09/24/21  Vaping Use   Vaping Use: Never used   Substance and Sexual Activity   Alcohol use: No    Alcohol/week: 0.0 standard drinks of alcohol   Drug use: No   Sexual activity: Not on file  Other Topics Concern   Not on file  Social History Narrative   Married    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    On child    1 son, 1 daughter   Machine op   Owns guns    Wears seat belt, safe in relationship    Smoker    Retired 06/27/2019   Social Determinants of Health   Financial Resource Strain: Low Risk  (08/11/2020)   Overall Financial Resource Strain (CARDIA)    Difficulty of Paying Living Expenses: Not hard at all  Food Insecurity: No Food Insecurity (12/11/2021)   Hunger Vital Sign    Worried About Running Out of Food in the Last Year: Never true    Ran Out of Food in the Last Year: Never true  Transportation Needs: No Transportation  Needs (12/11/2021)   PRAPARE - Administrator, Civil Service (Medical): No    Lack of Transportation (Non-Medical): No  Physical Activity: Sufficiently Active (08/11/2020)   Exercise Vital Sign    Days of Exercise per Week: 5 days    Minutes of Exercise per Session: 30 min  Stress: No Stress Concern Present (08/11/2020)   [employer] of Occupational Health - Occupational Stress Questionnaire    Feeling of Stress : Not at all  Social Connections: Unknown (08/11/2020)   Social Connection and Isolation Panel [NHANES]    Frequency of Communication with Friends and Family: More than three times a week    Frequency of Social Gatherings with Friends and Family: More than three times a week    Attends Religious Services: Not on file    Active Member of Clubs or Organizations: Yes    Attends Banker Meetings: Not on file    Marital Status: Married  Intimate Partner Violence: Not At Risk (08/11/2020)   Humiliation, Afraid, Rape, and Kick questionnaire    Fear of Current or Ex-Partner: No    Emotionally Abused: No    Physically Abused: No    Sexually Abused: No    Physical Exam      Future  Appointments  Date Time Provider Department Center  06/11/2022  7:00 AM [facility] DEVICE REMOTES [facility]  07/22/2022  9:00 AM Freddie Breech, DPM [facility]  09/10/2022  7:00 AM [facility] DEVICE REMOTES [facility]  12/10/2022  7:00 AM [facility] DEVICE REMOTES [facility]  03/11/2023  7:00 AM [facility] DEVICE REMOTES [facility]     ACTION: Home visit completed

## 2022-05-28 ENCOUNTER — Other Ambulatory Visit (HOSPITAL_COMMUNITY): Payer: Self-pay

## 2022-05-31 ENCOUNTER — Encounter: Payer: Self-pay | Admitting: Internal Medicine

## 2022-06-01 ENCOUNTER — Other Ambulatory Visit: Payer: Self-pay | Admitting: Pharmacist

## 2022-06-01 NOTE — Progress Notes (Signed)
Care Coordination  This patient is appearing on the insurance-provided list for being at risk of failing the adherence measure for Statin Therapy for Patients with Cardiovascular Disease [facility]) medications this calendar year.   Reviewed patient history. Per allergy documentation, he experienced muscle enzyme elevations on atorvastatin and rosuvastatin, though I am unable to find any evidence of elevated creatinine kinase. Appears he was established on atorvastatin, but he reported weakness and disorentation, so PCP discontinued on 10/14/2016 and changed to rosuvastatin 40 mg daily. Appears it was then discontinued by a CMA on 08/15/2017 at a Urology visit, likely on medication reconcilation. No reason for discontinuation was noted.   He established with Dr. Duke Salvia for cardiology. He was seen by Jari Favre 07/20/2020 and pravastatin 3 days weekly was started. Appears he was still taking this for several months, but it was discontinued 09/24/21 for reason "patient not taking".   I contacted patient today to discuss. He declines to discuss medications with me today, he notes he is seen by [facility] (he has been seeing heart failure clinic frequently recently, but not a primary cardiologist) and he also plans to establish with a new Primary Care Provider in [place]. I do not see any appointments scheduled with a new PCP or Dr. Leonides Sake office.   If patient cannot tolerate statin therapy due to muscle pains, the diagnosis code for statin myopathy (G72.0, T46.6X5A) can be billed to his insurance in a visit to exclude patient from this quality measure.   Will message Dr. Duke Salvia to see if patient can be seen by her office for hyperlipidemia follow up/appropriate statin therapy or exclusion coding.   Catie Eppie Gibson, PharmD, BCACP, CPP [facility] [phone]

## 2022-06-02 ENCOUNTER — Other Ambulatory Visit (HOSPITAL_COMMUNITY): Payer: Self-pay

## 2022-06-02 NOTE — Progress Notes (Signed)
Paramedicine Encounter    Patient ID: John Keith, male    DOB: 1951/04/09, 71 y.o.   MRN: [id number]   Arrived for home visit for John Keith who reports to be feeling overall well today. He is ambulating without increased shortness of breath, no weight gain, no lower leg swelling. No episodes of chest pain or dizziness over the last week. He has been med compliant over the last week. I reviewed meds and filled pill box for one week. HE STOPPED PREDNISONE AS OF TODAY. Refills will be noted and called into [facility]:  -Folic Acid -Carvedilol -Amiodarone   I obtained vitals as noted: WT- 240.2lbs BP- 120/62 HR- 64 irreg.  RR- 16  O2- 94% Lungs- clear No lower leg edema  I called and set up a PCP appointment for Mr. John Keith at [facility]) scheduled for April 2024 with T. Clent Ridges.    Appointments reviewed and confirmed. Home visit complete. I will see [name] in two weeks. He is aware to reach out if needed.   [name], EMT-Paramedic [phone] 06/02/2022   Patient Care Team: [name], Pasty Spillers, MD as PCP - General (Internal Medicine) Chilton Si, MD as PCP - Cardiology (Cardiology) Regan Lemming, MD as PCP - Electrophysiology (Cardiology) Pincus Sanes, MD as Consulting Physician (Internal Medicine)  Patient Active Problem List   Diagnosis Date Noted   Chronic systolic heart failure (HCC) 12/15/2021   Chronic combined systolic and diastolic CHF (congestive heart failure) (HCC) 12/15/2021   Nasal sore 12/15/2021   Syncope 12/07/2021   Ventricular tachycardia (HCC) 12/07/2021   Typical atrial flutter (HCC) 09/24/2021   Secondary hypercoagulable state (HCC) 09/24/2021   Abnormal x-ray of neck 06/16/2021   Abnormal MRI, lumbar spine 10/23/2020   Chronic low back pain 10/23/2020   Neuropathy 10/23/2020   Coronary artery disease of native artery of native heart with stable angina pectoris (HCC) 10/23/2020   Impingement syndrome of left  shoulder region 08/22/2020   Pain in joint of left shoulder 07/24/2020   Chronic left shoulder pain 07/11/2020   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   Anemia 12/13/2019   Chest pain of uncertain etiology 10/24/2019   PVC (premature ventricular contraction) 10/24/2019   Gastric ulcer without hemorrhage or perforation 09/28/2019   COVID-19 virus detected 07/12/2019   Elevated troponin 07/07/2019   Elevated serum protein level 07/07/2019   Postherpetic neuralgia 08/17/2018   Benign prostatic hyperplasia 08/01/2018   Hip pain 06/29/2018   Aortic atherosclerosis (HCC) 09/22/2016   Fatigue 06/03/2016   Lower urinary tract symptoms (LUTS) 06/03/2016   Constipation 03/10/2016   Lumbar radiculopathy 02/20/2016   Encounter for immunization 11/26/2015   Insomnia 06/10/2015   External hemorrhoid 03/07/2015   Health care maintenance 01/09/2014   Carpal tunnel syndrome of left wrist 01/03/2013   Hyperlipidemia 11/09/2012   Vitamin D deficiency 11/09/2012   Essential hypertension 11/06/2012   Tobacco abuse 11/06/2012   Seasonal allergies 11/06/2012   Erectile dysfunction 11/06/2012   GERD (gastroesophageal reflux disease) 11/06/2012   DDD (degenerative disc disease), thoracolumbar 11/06/2012   Lactose intolerance 11/06/2012   Diverticulosis of colon without hemorrhage 11/06/2012    Current Outpatient Medications:    amiodarone (PACERONE) 200 MG tablet, Take 1 tablet (200 mg total) by mouth daily., Disp: 30 tablet, Rfl: 5   apixaban (ELIQUIS) 5 MG TABS tablet, Take 1 tablet (5 mg total) by mouth 2 (two) times daily., Disp: 60 tablet, Rfl: 3   calcium carbonate (OS-CAL - DOSED IN MG OF ELEMENTAL CALCIUM) 1250 (  500 Ca) MG tablet, Take 1 tablet (500 mg of elemental calcium total) by mouth daily with breakfast., Disp: 30 tablet, Rfl: 11   carvedilol (COREG) 6.25 MG tablet, Take 1 tablet (6.25 mg total) by mouth 2 (two) times daily with a meal., Disp: 60 tablet, Rfl: 5   Cholecalciferol (VITAMIN  D-3) 125 MCG (5000 UT) TABS, Take 1 tablet by mouth daily., Disp: 30 tablet, Rfl: 11   ezetimibe (ZETIA) 10 MG tablet, Take 1 tablet (10 mg total) by mouth daily., Disp: 90 tablet, Rfl: 3   FARXIGA 10 MG TABS tablet, Take 1 tablet (10 mg total) by mouth daily., Disp: 90 tablet, Rfl: 3   ferrous sulfate 325 (65 FE) MG tablet, Take 325 mg by mouth daily with breakfast., Disp: , Rfl:    folic acid (FOLVITE) 1 MG tablet, Take 1 tablet (1 mg total) by mouth daily., Disp: 30 tablet, Rfl: 11   gabapentin (NEURONTIN) 300 MG capsule, Take 300 mg by mouth daily., Disp: , Rfl:    methotrexate (RHEUMATREX) 2.5 MG tablet, Take 20 mg (8 tablets) every Wednesday. PROTECT FROM LIGHT AS DIRECTED, Disp: 40 tablet, Rfl: 3   Multiple Vitamin (MULTIVITAMIN) tablet, Take 1 tablet by mouth daily., Disp: , Rfl:    pantoprazole (PROTONIX) 40 MG tablet, Take 1 tablet (40 mg total) by mouth daily. 30 min before food, Disp: 90 tablet, Rfl: 3   sacubitril-valsartan (ENTRESTO) 24-26 MG, Take 1 tablet by mouth 2 (two) times daily., Disp: 180 tablet, Rfl: 3   spironolactone (ALDACTONE) 25 MG tablet, Take 1 tablet (25 mg total) by mouth daily., Disp: 30 tablet, Rfl: 6 Allergies  Allergen Reactions   Crestor [Rosuvastatin Calcium] Other (See Comments)    Elevated muscle enzymes    Lipitor [Atorvastatin Calcium] Other (See Comments)    Elevated muscle enzymes      Social History   Socioeconomic History   Marital status: Married    Spouse name: John Keith   Number of children: 2   Years of education: Not on file   Highest education level: High school graduate  Occupational History   Occupation: Retired    Comment: Semi  Tobacco Use   Smoking status: Former    Packs/day: 0.50    Types: Cigarettes    Start date: 07/06/1971    Quit date: 06/26/2020    Years since quitting: 1.9   Smokeless tobacco: Never   Tobacco comments:    Former smoker 09/24/21  Vaping Use   Vaping Use: Never used  Substance and Sexual  Activity   Alcohol use: No    Alcohol/week: 0.0 standard drinks of alcohol   Drug use: No   Sexual activity: Not on file  Other Topics Concern   Not on file  Social History Narrative   Married    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    On child    1 son, 1 daughter   Systems analyst op   Owns guns    Wears seat belt, safe in relationship    Smoker    Retired 06/27/2019   Social Determinants of Health   Financial Resource Strain: Low Risk  (08/11/2020)   Overall Financial Resource Strain (CARDIA)    Difficulty of Paying Living Expenses: Not hard at all  Food Insecurity: No Food Insecurity (12/11/2021)   Hunger Vital Sign    Worried About Running Out of Food in the Last Year: Never true    Ran Out of Food in the Last Year: Never true  Transportation Needs: No  Transportation Needs (12/11/2021)   PRAPARE - Administrator, Civil Service (Medical): No    Lack of Transportation (Non-Medical): No  Physical Activity: Sufficiently Active (08/11/2020)   Exercise Vital Sign    Days of Exercise per Week: 5 days    Minutes of Exercise per Session: 30 min  Stress: No Stress Concern Present (08/11/2020)   [employer] of Occupational Health - Occupational Stress Questionnaire    Feeling of Stress : Not at all  Social Connections: Unknown (08/11/2020)   Social Connection and Isolation Panel [NHANES]    Frequency of Communication with Friends and Family: More than three times a week    Frequency of Social Gatherings with Friends and Family: More than three times a week    Attends Religious Services: Not on file    Active Member of Clubs or Organizations: Yes    Attends Banker Meetings: Not on file    Marital Status: Married  Intimate Partner Violence: Not At Risk (08/11/2020)   Humiliation, Afraid, Rape, and Kick questionnaire    Fear of Current or Ex-Partner: No    Emotionally Abused: No    Physically Abused: No    Sexually Abused: No    Physical Exam      Future Appointments  Date Time  Provider Department Center  06/11/2022  7:00 AM [facility]ICE [facility]ES [facility]  07/22/2022  9:00 AM Freddie Breech, DPM [facility]  09/10/2022  7:00 AM [facility] DEVICE REMOTES [facility]  12/10/2022  7:00 AM [facility] DEVICE [facility]OTES [facility]  03/11/2023  7:00 AM [facility] DEVICE [facility]ES [facility]     ACTION: Home visit completed

## 2022-06-11 ENCOUNTER — Ambulatory Visit (INDEPENDENT_AMBULATORY_CARE_PROVIDER_SITE_OTHER): Payer: PPO

## 2022-06-11 DIAGNOSIS — I472 Ventricular tachycardia, unspecified: Secondary | ICD-10-CM

## 2022-06-11 LAB — CUP PACEART REMOTE DEVICE CHECK
Battery Remaining Longevity: 134 mo
Battery Voltage: 3.02 V
Brady Statistic RV Percent Paced: 3.34 %
Date Time Interrogation Session: 20231208001803
HighPow Impedance: 68 Ohm
Implantable Lead Connection Status: 753985
Implantable Lead Implant Date: 20230608
Implantable Lead Location: 753860
Implantable Pulse Generator Implant Date: 20230608
Lead Channel Impedance Value: 285 Ohm
Lead Channel Impedance Value: 380 Ohm
Lead Channel Pacing Threshold Amplitude: 0.625 V
Lead Channel Pacing Threshold Pulse Width: 0.4 ms
Lead Channel Sensing Intrinsic Amplitude: 10.375 mV
Lead Channel Sensing Intrinsic Amplitude: 10.375 mV
Lead Channel Setting Pacing Amplitude: 2 V
Lead Channel Setting Pacing Pulse Width: 0.4 ms
Lead Channel Setting Sensing Sensitivity: 0.3 mV
Zone Setting Status: 755011
Zone Setting Status: 755011

## 2022-06-16 ENCOUNTER — Telehealth (HOSPITAL_COMMUNITY): Payer: Self-pay | Admitting: Pharmacy Technician

## 2022-06-16 ENCOUNTER — Other Ambulatory Visit (HOSPITAL_COMMUNITY): Payer: Self-pay

## 2022-06-16 NOTE — Progress Notes (Signed)
Paramedicine Encounter    Patient ID: John Keith, male    DOB: 13-May-1951, 71 y.o.   MRN: [id number]  Arrived for home visit for John Keith who reports to be feeling good with no complaints. He denied chest pain, shortness of breath, dizziness or swelling over the last two weeks. He denied recent weight gain and has been mostly compliant with all meds. I reviewed pill box. He did miss one night time dose over the weekend but realized same and admitted to it and knows to improve.   I reviewed meds and filled pill box for one week.   I obtained vitals as noted: WT- 241lbs BP- 130/62 HR- 63 O2- 94% RR- 16  Lungs- clear Edema- none   He has been wearing his compression stockings daily. He denied any recent ICD firings or trouble.   I recorded refills and called in same -amiodarone (needs refills sent in)  Appointments reviewed and confirmed. Home visit complete. I will see John Keith in one week.   [name], EMT-Paramedic [phone] 06/16/2022   Patient Care Team: [name], Pasty Spillers, MD as PCP - General (Internal Medicine) Chilton Si, MD as PCP - Cardiology (Cardiology) Regan Lemming, MD as PCP - Electrophysiology (Cardiology) Pincus Sanes, MD as Consulting Physician (Internal Medicine)  Patient Active Problem List   Diagnosis Date Noted   Chronic systolic heart failure (HCC) 12/15/2021   Chronic combined systolic and diastolic CHF (congestive heart failure) (HCC) 12/15/2021   Nasal sore 12/15/2021   Syncope 12/07/2021   Ventricular tachycardia (HCC) 12/07/2021   Typical atrial flutter (HCC) 09/24/2021   Secondary hypercoagulable state (HCC) 09/24/2021   Abnormal x-ray of neck 06/16/2021   Abnormal MRI, lumbar spine 10/23/2020   Chronic low back pain 10/23/2020   Neuropathy 10/23/2020   Coronary artery disease of native artery of native heart with stable angina pectoris (HCC) 10/23/2020   Impingement syndrome of left shoulder region  08/22/2020   Pain in joint of left shoulder 07/24/2020   Chronic left shoulder pain 07/11/2020   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   Anemia 12/13/2019   Chest pain of uncertain etiology 10/24/2019   PVC (premature ventricular contraction) 10/24/2019   Gastric ulcer without hemorrhage or perforation 09/28/2019   COVID-19 virus detected 07/12/2019   Elevated troponin 07/07/2019   Elevated serum protein level 07/07/2019   Postherpetic neuralgia 08/17/2018   Benign prostatic hyperplasia 08/01/2018   Hip pain 06/29/2018   Aortic atherosclerosis (HCC) 09/22/2016   Fatigue 06/03/2016   Lower urinary tract symptoms (LUTS) 06/03/2016   Constipation 03/10/2016   Lumbar radiculopathy 02/20/2016   Encounter for immunization 11/26/2015   Insomnia 06/10/2015   External hemorrhoid 03/07/2015   Health care maintenance 01/09/2014   Carpal tunnel syndrome of left wrist 01/03/2013   Hyperlipidemia 11/09/2012   Vitamin D deficiency 11/09/2012   Essential hypertension 11/06/2012   Tobacco abuse 11/06/2012   Seasonal allergies 11/06/2012   Erectile dysfunction 11/06/2012   GERD (gastroesophageal reflux disease) 11/06/2012   DDD (degenerative disc disease), thoracolumbar 11/06/2012   Lactose intolerance 11/06/2012   Diverticulosis of colon without hemorrhage 11/06/2012    Current Outpatient Medications:    amiodarone (PACERONE) 200 MG tablet, Take 1 tablet (200 mg total) by mouth daily., Disp: 30 tablet, Rfl: 5   apixaban (ELIQUIS) 5 MG TABS tablet, Take 1 tablet (5 mg total) by mouth 2 (two) times daily., Disp: 60 tablet, Rfl: 3   calcium carbonate (OS-CAL - DOSED IN MG OF ELEMENTAL CALCIUM) 1250 (500 Ca) MG  tablet, Take 1 tablet (500 mg of elemental calcium total) by mouth daily with breakfast., Disp: 30 tablet, Rfl: 11   carvedilol (COREG) 6.25 MG tablet, Take 1 tablet (6.25 mg total) by mouth 2 (two) times daily with a meal., Disp: 60 tablet, Rfl: 5   Cholecalciferol (VITAMIN D-3) 125 MCG (5000  UT) TABS, Take 1 tablet by mouth daily., Disp: 30 tablet, Rfl: 11   ezetimibe (ZETIA) 10 MG tablet, Take 1 tablet (10 mg total) by mouth daily., Disp: 90 tablet, Rfl: 3   FARXIGA 10 MG TABS tablet, Take 1 tablet (10 mg total) by mouth daily., Disp: 90 tablet, Rfl: 3   ferrous sulfate 325 (65 FE) MG tablet, Take 325 mg by mouth daily with breakfast., Disp: , Rfl:    folic acid (FOLVITE) 1 MG tablet, Take 1 tablet (1 mg total) by mouth daily., Disp: 30 tablet, Rfl: 11   gabapentin (NEURONTIN) 300 MG capsule, Take 300 mg by mouth daily., Disp: , Rfl:    methotrexate (RHEUMATREX) 2.5 MG tablet, Take 20 mg (8 tablets) every Wednesday. PROTECT FROM LIGHT AS DIRECTED, Disp: 40 tablet, Rfl: 3   Multiple Vitamin (MULTIVITAMIN) tablet, Take 1 tablet by mouth daily., Disp: , Rfl:    pantoprazole (PROTONIX) 40 MG tablet, Take 1 tablet (40 mg total) by mouth daily. 30 min before food, Disp: 90 tablet, Rfl: 3   sacubitril-valsartan (ENTRESTO) 24-26 MG, Take 1 tablet by mouth 2 (two) times daily., Disp: 180 tablet, Rfl: 3   spironolactone (ALDACTONE) 25 MG tablet, Take 1 tablet (25 mg total) by mouth daily., Disp: 30 tablet, Rfl: 6 Allergies  Allergen Reactions   Crestor [Rosuvastatin Calcium] Other (See Comments)    Elevated muscle enzymes    Lipitor [Atorvastatin Calcium] Other (See Comments)    Elevated muscle enzymes      Social History   Socioeconomic History   Marital status: Married    Spouse name: Gwendolyn   Number of children: 2   Years of education: Not on file   Highest education level: High school graduate  Occupational History   Occupation: Retired    Comment: Semi  Tobacco Use   Smoking status: Former    Packs/day: 0.50    Types: Cigarettes    Start date: 07/06/1971    Quit date: 06/26/2020    Years since quitting: 1.9   Smokeless tobacco: Never   Tobacco comments:    Former smoker 09/24/21  Vaping Use   Vaping Use: Never used  Substance and Sexual Activity   Alcohol use:  No    Alcohol/week: 0.0 standard drinks of alcohol   Drug use: No   Sexual activity: Not on file  Other Topics Concern   Not on file  Social History Narrative   Married    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    On child    1 son, 1 daughter   Systems analyst op   Owns guns    Wears seat belt, safe in relationship    Smoker    Retired 06/27/2019   Social Determinants of Health   Financial Resource Strain: Low Risk  (08/11/2020)   Overall Financial Resource Strain (CARDIA)    Difficulty of Paying Living Expenses: Not hard at all  Food Insecurity: No Food Insecurity (12/11/2021)   Hunger Vital Sign    Worried About Running Out of Food in the Last Year: Never true    Ran Out of Food in the Last Year: Never true  Transportation Needs: No Transportation Needs (12/11/2021)  PRAPARE - Administrator, Civil Service (Medical): No    Lack of Transportation (Non-Medical): No  Physical Activity: Sufficiently Active (08/11/2020)   Exercise Vital Sign    Days of Exercise per Week: 5 days    Minutes of Exercise per Session: 30 min  Stress: No Stress Concern Present (08/11/2020)   [employer] of Occupational Health - Occupational Stress Questionnaire    Feeling of Stress : Not at all  Social Connections: Unknown (08/11/2020)   Social Connection and Isolation Panel [NHANES]    Frequency of Communication with Friends and Family: More than three times a week    Frequency of Social Gatherings with Friends and Family: More than three times a week    Attends Religious Services: Not on file    Active Member of Clubs or Organizations: Yes    Attends Banker Meetings: Not on file    Marital Status: Married  Intimate Partner Violence: Not At Risk (08/11/2020)   Humiliation, Afraid, Rape, and Kick questionnaire    Fear of Current or Ex-Partner: No    Emotionally Abused: No    Physically Abused: No    Sexually Abused: No    Physical Exam      Future Appointments  Date Time Provider Department  Center  07/22/2022  9:00 AM Freddie Breech, DPM [facility]  09/10/2022  7:00 AM [facility]  10/07/2022  3:00 PM Dana Allan, MD [facility]  12/10/2022  7:00 AM [facility]  03/11/2023  7:00 AM [facility]     ACTION: Home visit completed

## 2022-06-16 NOTE — Telephone Encounter (Signed)
Advanced Heart Failure Patient Advocate Encounter  Sent in [employer] renewal application for Eliquis assistance. Patient included OOP for 2023. Will likely not be sufficient to gain 2024 approval.  Document scanned to chart.

## 2022-06-17 ENCOUNTER — Other Ambulatory Visit (HOSPITAL_COMMUNITY): Payer: Self-pay

## 2022-06-17 MED ORDER — AMIODARONE HCL 200 MG PO TABS: 200.0000 mg | ORAL_TABLET | Freq: Every day | ORAL | 5 refills | Status: DC

## 2022-06-23 ENCOUNTER — Other Ambulatory Visit: Payer: Self-pay | Admitting: Family Medicine

## 2022-06-23 ENCOUNTER — Telehealth (HOSPITAL_COMMUNITY): Payer: Self-pay | Admitting: Licensed Clinical Social Worker

## 2022-06-23 ENCOUNTER — Telehealth (HOSPITAL_COMMUNITY): Payer: Self-pay

## 2022-06-23 ENCOUNTER — Other Ambulatory Visit (HOSPITAL_COMMUNITY): Payer: Self-pay

## 2022-06-23 DIAGNOSIS — J349 Unspecified disorder of nose and nasal sinuses: Secondary | ICD-10-CM

## 2022-06-23 NOTE — Telephone Encounter (Signed)
During home paramedicine visit today Mr. Yeargan reports wanting a referral for [facility] in [facility] in regards to some issues with his sinuses that he was seen for previously but wants to be seen at this specific location. I tried to call and attempt to schedule an appointment for him however they require referral from PCP. I will forward to PCP.   [name], EMT-Paramedic [facility]  [phone] 06/23/2022

## 2022-06-23 NOTE — Progress Notes (Signed)
Referral sent to ENT at patients request  John Allan, MD

## 2022-06-23 NOTE — Telephone Encounter (Signed)
HF Paramedicine Team Based Care Meeting  HF MD- NA  HF NP - Amy Clegg NP-C   [facility] HF Paramedicine  Katie Vicente Males  [facility] admit within the last 30 days for heart failure? no  Medications concerns? None- is compliant for the most part.  SDOH - none  Eligible for discharge? Still continuing to be followed for new sarcoidosis due to complex protocol at provider request.   Burna Sis, LCSW Clinical Social Worker [facility] Desk#: [phone] Cell#: [phone]

## 2022-06-23 NOTE — Telephone Encounter (Signed)
Referral sent 

## 2022-06-23 NOTE — Progress Notes (Signed)
Paramedicine Encounter    Patient ID: John Keith, male    DOB: May 20, 1951, 71 y.o.   MRN: [id number]  Arrived for home visit for Arleigh who reports to be feeling good today. He reports that he has had no shortness of breath, chest pain, dizziness or missed meds.  I reviewed meds and confirmed same filling pill box for one week.   Refills: Spironolactone   He is requesting a new ear nose and throat doctor. I found one locally in [facility] but they require a referral. I will reach out to PCP for same as he does not see PCP until March.   We reviewed appointments and confirmed same.   HF education provided including meds, diet and fluid suggestion compliance.   Vitals and assessment as noted: WT- 239lbs BP- 130/62 HR- 62 O2- 95% RR- 16  Lungs- clear  Edema- none   Home visit complete. I will see Kannen in one week.   [name], EMT-Paramedic [phone] 06/23/2022      Patient Care Team: [name], Pasty Spillers, MD as PCP - General (Internal Medicine) Chilton Si, MD as PCP - Cardiology (Cardiology) Regan Lemming, MD as PCP - Electrophysiology (Cardiology) Pincus Sanes, MD as Consulting Physician (Internal Medicine)  Patient Active Problem List   Diagnosis Date Noted   Chronic systolic heart failure (HCC) 12/15/2021   Chronic combined systolic and diastolic CHF (congestive heart failure) (HCC) 12/15/2021   Nasal sore 12/15/2021   Syncope 12/07/2021   Ventricular tachycardia (HCC) 12/07/2021   Typical atrial flutter (HCC) 09/24/2021   Secondary hypercoagulable state (HCC) 09/24/2021   Abnormal x-ray of neck 06/16/2021   Abnormal MRI, lumbar spine 10/23/2020   Chronic low back pain 10/23/2020   Neuropathy 10/23/2020   Coronary artery disease of native artery of native heart with stable angina pectoris (HCC) 10/23/2020   Impingement syndrome of left shoulder region 08/22/2020   Pain in joint of left shoulder 07/24/2020   Chronic left  shoulder pain 07/11/2020   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   Anemia 12/13/2019   Chest pain of uncertain etiology 10/24/2019   PVC (premature ventricular contraction) 10/24/2019   Gastric ulcer without hemorrhage or perforation 09/28/2019   COVID-19 virus detected 07/12/2019   Elevated troponin 07/07/2019   Elevated serum protein level 07/07/2019   Postherpetic neuralgia 08/17/2018   Benign prostatic hyperplasia 08/01/2018   Hip pain 06/29/2018   Aortic atherosclerosis (HCC) 09/22/2016   Fatigue 06/03/2016   Lower urinary tract symptoms (LUTS) 06/03/2016   Constipation 03/10/2016   Lumbar radiculopathy 02/20/2016   Encounter for immunization 11/26/2015   Insomnia 06/10/2015   External hemorrhoid 03/07/2015   Health care maintenance 01/09/2014   Carpal tunnel syndrome of left wrist 01/03/2013   Hyperlipidemia 11/09/2012   Vitamin D deficiency 11/09/2012   Essential hypertension 11/06/2012   Tobacco abuse 11/06/2012   Seasonal allergies 11/06/2012   Erectile dysfunction 11/06/2012   GERD (gastroesophageal reflux disease) 11/06/2012   DDD (degenerative disc disease), thoracolumbar 11/06/2012   Lactose intolerance 11/06/2012   Diverticulosis of colon without hemorrhage 11/06/2012    Current Outpatient Medications:    amiodarone (PACERONE) 200 MG tablet, Take 1 tablet (200 mg total) by mouth daily., Disp: 30 tablet, Rfl: 5   apixaban (ELIQUIS) 5 MG TABS tablet, Take 1 tablet (5 mg total) by mouth 2 (two) times daily., Disp: 60 tablet, Rfl: 3   calcium carbonate (OS-CAL - DOSED IN MG OF ELEMENTAL CALCIUM) 1250 (500 Ca) MG tablet, Take 1 tablet (500 mg  of elemental calcium total) by mouth daily with breakfast., Disp: 30 tablet, Rfl: 11   carvedilol (COREG) 6.25 MG tablet, Take 1 tablet (6.25 mg total) by mouth 2 (two) times daily with a meal., Disp: 60 tablet, Rfl: 5   Cholecalciferol (VITAMIN D-3) 125 MCG (5000 UT) TABS, Take 1 tablet by mouth daily., Disp: 30 tablet, Rfl: 11    ezetimibe (ZETIA) 10 MG tablet, Take 1 tablet (10 mg total) by mouth daily., Disp: 90 tablet, Rfl: 3   FARXIGA 10 MG TABS tablet, Take 1 tablet (10 mg total) by mouth daily., Disp: 90 tablet, Rfl: 3   ferrous sulfate 325 (65 FE) MG tablet, Take 325 mg by mouth daily with breakfast., Disp: , Rfl:    folic acid (FOLVITE) 1 MG tablet, Take 1 tablet (1 mg total) by mouth daily., Disp: 30 tablet, Rfl: 11   gabapentin (NEURONTIN) 300 MG capsule, Take 300 mg by mouth daily., Disp: , Rfl:    methotrexate (RHEUMATREX) 2.5 MG tablet, Take 20 mg (8 tablets) every Wednesday. PROTECT FROM LIGHT AS DIRECTED, Disp: 40 tablet, Rfl: 3   Multiple Vitamin (MULTIVITAMIN) tablet, Take 1 tablet by mouth daily., Disp: , Rfl:    pantoprazole (PROTONIX) 40 MG tablet, Take 1 tablet (40 mg total) by mouth daily. 30 min before food, Disp: 90 tablet, Rfl: 3   sacubitril-valsartan (ENTRESTO) 24-26 MG, Take 1 tablet by mouth 2 (two) times daily., Disp: 180 tablet, Rfl: 3   spironolactone (ALDACTONE) 25 MG tablet, Take 1 tablet (25 mg total) by mouth daily., Disp: 30 tablet, Rfl: 6 Allergies  Allergen Reactions   Crestor [Rosuvastatin Calcium] Other (See Comments)    Elevated muscle enzymes    Lipitor [Atorvastatin Calcium] Other (See Comments)    Elevated muscle enzymes      Social History   Socioeconomic History   Marital status: Married    Spouse name: Gwendolyn   Number of children: 2   Years of education: Not on file   Highest education level: High school graduate  Occupational History   Occupation: Retired    Comment: Semi  Tobacco Use   Smoking status: Former    Packs/day: 0.50    Types: Cigarettes    Start date: 07/06/1971    Quit date: 06/26/2020    Years since quitting: 1.9   Smokeless tobacco: Never   Tobacco comments:    Former smoker 09/24/21  Vaping Use   Vaping Use: Never used  Substance and Sexual Activity   Alcohol use: No    Alcohol/week: 0.0 standard drinks of alcohol   Drug use: No    Sexual activity: Not on file  Other Topics Concern   Not on file  Social History Narrative   Married    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    On child    1 son, 1 daughter   Systems analyst op   Owns guns    Wears seat belt, safe in relationship    Smoker    Retired 06/27/2019   Social Determinants of Health   Financial Resource Strain: Low Risk  (08/11/2020)   Overall Financial Resource Strain (CARDIA)    Difficulty of Paying Living Expenses: Not hard at all  Food Insecurity: No Food Insecurity (12/11/2021)   Hunger Vital Sign    Worried About Running Out of Food in the Last Year: Never true    Ran Out of Food in the Last Year: Never true  Transportation Needs: No Transportation Needs (12/11/2021)   PRAPARE - Transportation  Lack of Transportation (Medical): No    Lack of Transportation (Non-Medical): No  Physical Activity: Sufficiently Active (08/11/2020)   Exercise Vital Sign    Days of Exercise per Week: 5 days    Minutes of Exercise per Session: 30 min  Stress: No Stress Concern Present (08/11/2020)   [employer] of Occupational Health - Occupational Stress Questionnaire    Feeling of Stress : Not at all  Social Connections: Unknown (08/11/2020)   Social Connection and Isolation Panel [NHANES]    Frequency of Communication with Friends and Family: More than three times a week    Frequency of Social Gatherings with Friends and Family: More than three times a week    Attends Religious Services: Not on file    Active Member of Clubs or Organizations: Yes    Attends Banker Meetings: Not on file    Marital Status: Married  Intimate Partner Violence: Not At Risk (08/11/2020)   Humiliation, Afraid, Rape, and Kick questionnaire    Fear of Current or Ex-Partner: No    Emotionally Abused: No    Physically Abused: No    Sexually Abused: No    Physical Exam      Future Appointments  Date Time Provider Department Center  07/22/2022  9:00 AM Freddie Breech, DPM [facility]   09/10/2022  7:00 AM [facility]  10/07/2022  3:00 PM Dana Allan, MD [facility]  12/10/2022  7:00 AM [facility]  03/11/2023  7:00 AM [facility]     ACTION: Home visit completed

## 2022-06-24 NOTE — Telephone Encounter (Signed)
Called Patient to let him know the referral was sent to [facility] in [facility] and that someone will be in touch to schedule an appointment.

## 2022-06-30 ENCOUNTER — Other Ambulatory Visit (HOSPITAL_COMMUNITY): Payer: Self-pay

## 2022-06-30 NOTE — Progress Notes (Signed)
Paramedicine Encounter    Patient ID: John Keith, male    DOB: [birth year], 71 y.o.   MRN: [id number]   Arrived for home visit for Marik who reports to be feeling good. He denied shortness of breath, chest pain, edema or missed doses of medications. He noted to have taken all meds for the last week. He did admit to overeating during the holiday and his weight increased from the 25th 236lbs to the 26th 241lbs- total increase by 6lbs. Today his weight is 240lbs. He endorses no shortness of breath, no lower leg swelling or edema. Lungs clear. Vitals as noted:  WT- 240lbs BP- 130/70 HR- 70 O2- 94% RR- 16 Lungs- clear  He reports drinking 64oz daily and reports that he is not using much table salt. We discussed fluid recommendations and salt intake. He verbalized understanding and says he is going to try to be more conscious of food labels.   We reviewed appointments and confirmed same.   I reviewed meds and filled pill box for one week. Refills as noted called into [facility]: -Spironolactone -Carvedilol   Home visit complete. I will see Trevaris in one week.   [name], EMT-Paramedic [phone] 06/30/2022   Patient Care Team: [name], Pasty Spillers, MD as PCP - General (Internal Medicine) Chilton Si, MD as PCP - Cardiology (Cardiology) Regan Lemming, MD as PCP - Electrophysiology (Cardiology) Pincus Sanes, MD as Consulting Physician (Internal Medicine)  Patient Active Problem List   Diagnosis Date Noted   Chronic systolic heart failure (HCC) 12/15/2021   Chronic combined systolic and diastolic CHF (congestive heart failure) (HCC) 12/15/2021   Nasal sore 12/15/2021   Syncope 12/07/2021   Ventricular tachycardia (HCC) 12/07/2021   Typical atrial flutter (HCC) 09/24/2021   Secondary hypercoagulable state (HCC) 09/24/2021   Abnormal x-ray of neck 06/16/2021   Abnormal MRI, lumbar spine 10/23/2020   Chronic low back pain 10/23/2020   Neuropathy  10/23/2020   Coronary artery disease of native artery of native heart with stable angina pectoris (HCC) 10/23/2020   Impingement syndrome of left shoulder region 08/22/2020   Pain in joint of left shoulder 07/24/2020   Chronic left shoulder pain 07/11/2020   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   Anemia 12/13/2019   Chest pain of uncertain etiology 10/24/2019   PVC (premature ventricular contraction) 10/24/2019   Gastric ulcer without hemorrhage or perforation 09/28/2019   COVID-19 virus detected 07/12/2019   Elevated troponin 07/07/2019   Elevated serum protein level 07/07/2019   Postherpetic neuralgia 08/17/2018   Benign prostatic hyperplasia 08/01/2018   Hip pain 06/29/2018   Aortic atherosclerosis (HCC) 09/22/2016   Fatigue 06/03/2016   Lower urinary tract symptoms (LUTS) 06/03/2016   Constipation 03/10/2016   Lumbar radiculopathy 02/20/2016   Encounter for immunization 11/26/2015   Insomnia 06/10/2015   External hemorrhoid 03/07/2015   Health care maintenance 01/09/2014   Carpal tunnel syndrome of left wrist 01/03/2013   Hyperlipidemia 11/09/2012   Vitamin D deficiency 11/09/2012   Essential hypertension 11/06/2012   Tobacco abuse 11/06/2012   Seasonal allergies 11/06/2012   Erectile dysfunction 11/06/2012   GERD (gastroesophageal reflux disease) 11/06/2012   DDD (degenerative disc disease), thoracolumbar 11/06/2012   Lactose intolerance 11/06/2012   Diverticulosis of colon without hemorrhage 11/06/2012    Current Outpatient Medications:    amiodarone (PACERONE) 200 MG tablet, Take 1 tablet (200 mg total) by mouth daily., Disp: 30 tablet, Rfl: 5   apixaban (ELIQUIS) 5 MG TABS tablet, Take 1 tablet (5 mg total) by  mouth 2 (two) times daily., Disp: 60 tablet, Rfl: 3   calcium carbonate (OS-CAL - DOSED IN MG OF ELEMENTAL CALCIUM) 1250 (500 Ca) MG tablet, Take 1 tablet (500 mg of elemental calcium total) by mouth daily with breakfast., Disp: 30 tablet, Rfl: 11   carvedilol  (COREG) 6.25 MG tablet, Take 1 tablet (6.25 mg total) by mouth 2 (two) times daily with a meal., Disp: 60 tablet, Rfl: 5   Cholecalciferol (VITAMIN D-3) 125 MCG (5000 UT) TABS, Take 1 tablet by mouth daily., Disp: 30 tablet, Rfl: 11   ezetimibe (ZETIA) 10 MG tablet, Take 1 tablet (10 mg total) by mouth daily., Disp: 90 tablet, Rfl: 3   FARXIGA 10 MG TABS tablet, Take 1 tablet (10 mg total) by mouth daily., Disp: 90 tablet, Rfl: 3   ferrous sulfate 325 (65 FE) MG tablet, Take 325 mg by mouth daily with breakfast., Disp: , Rfl:    folic acid (FOLVITE) 1 MG tablet, Take 1 tablet (1 mg total) by mouth daily., Disp: 30 tablet, Rfl: 11   gabapentin (NEURONTIN) 300 MG capsule, Take 300 mg by mouth daily., Disp: , Rfl:    methotrexate (RHEUMATREX) 2.5 MG tablet, Take 20 mg (8 tablets) every Wednesday. PROTECT FROM LIGHT AS DIRECTED, Disp: 40 tablet, Rfl: 3   Multiple Vitamin (MULTIVITAMIN) tablet, Take 1 tablet by mouth daily., Disp: , Rfl:    pantoprazole (PROTONIX) 40 MG tablet, Take 1 tablet (40 mg total) by mouth daily. 30 min before food, Disp: 90 tablet, Rfl: 3   sacubitril-valsartan (ENTRESTO) 24-26 MG, Take 1 tablet by mouth 2 (two) times daily., Disp: 180 tablet, Rfl: 3   spironolactone (ALDACTONE) 25 MG tablet, Take 1 tablet (25 mg total) by mouth daily., Disp: 30 tablet, Rfl: 6 Allergies  Allergen Reactions   Crestor [Rosuvastatin Calcium] Other (See Comments)    Elevated muscle enzymes    Lipitor [Atorvastatin Calcium] Other (See Comments)    Elevated muscle enzymes      Social History   Socioeconomic History   Marital status: Married    Spouse name: Gwendolyn   Number of children: 2   Years of education: Not on file   Highest education level: High school graduate  Occupational History   Occupation: Retired    Comment: Semi  Tobacco Use   Smoking status: Former    Packs/day: 0.50    Types: Cigarettes    Start date: 07/06/1971    Quit date: 06/26/2020    Years since quitting:  2.0   Smokeless tobacco: Never   Tobacco comments:    Former smoker 09/24/21  Vaping Use   Vaping Use: Never used  Substance and Sexual Activity   Alcohol use: No    Alcohol/week: 0.0 standard drinks of alcohol   Drug use: No   Sexual activity: Not on file  Other Topics Concern   Not on file  Social History Narrative   Married    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    On child    1 son, 1 daughter   Machine op   Owns guns    Wears seat belt, safe in relationship    Smoker    Retired 06/27/2019   Social Determinants of Health   Financial Resource Strain: Low Risk  (08/11/2020)   Overall Financial Resource Strain (CARDIA)    Difficulty of Paying Living Expenses: Not hard at all  Food Insecurity: No Food Insecurity (12/11/2021)   Hunger Vital Sign    Worried About Running Out of Food  in the Last Year: Never true    Ran Out of Food in the Last Year: Never true  Transportation Needs: No Transportation Needs (12/11/2021)   PRAPARE - Administrator, Civil Service (Medical): No    Lack of Transportation (Non-Medical): No  Physical Activity: Sufficiently Active (08/11/2020)   Exercise Vital Sign    Days of Exercise per Week: 5 days    Minutes of Exercise per Session: 30 min  Stress: No Stress Concern Present (08/11/2020)   [employer] of Occupational Health - Occupational Stress Questionnaire    Feeling of Stress : Not at all  Social Connections: Unknown (08/11/2020)   Social Connection and Isolation Panel [NHANES]    Frequency of Communication with Friends and Family: More than three times a week    Frequency of Social Gatherings with Friends and Family: More than three times a week    Attends Religious Services: Not on file    Active Member of Clubs or Organizations: Yes    Attends Banker Meetings: Not on file    Marital Status: Married  Intimate Partner Violence: Not At Risk (08/11/2020)   Humiliation, Afraid, Rape, and Kick questionnaire    Fear of Current or Ex-Partner:  No    Emotionally Abused: No    Physically Abused: No    Sexually Abused: No    Physical Exam      Future Appointments  Date Time Provider Department Center  07/22/2022  9:00 AM Freddie Breech, DPM [facility]  09/10/2022  7:00 AM [facility]  10/07/2022  3:00 PM Dana Allan, MD [facility]  12/10/2022  7:00 AM [facility]  03/11/2023  7:00 AM [facility]     ACTION: Home visit completed

## 2022-07-01 ENCOUNTER — Other Ambulatory Visit (HOSPITAL_COMMUNITY): Payer: Self-pay

## 2022-07-01 MED ORDER — CARVEDILOL 6.25 MG PO TABS: 6.2500 mg | ORAL_TABLET | Freq: Two times a day (BID) | ORAL | 6 refills | Status: DC

## 2022-07-02 NOTE — Progress Notes (Signed)
Remote ICD transmission.   

## 2022-07-07 ENCOUNTER — Other Ambulatory Visit (HOSPITAL_COMMUNITY): Payer: Self-pay

## 2022-07-07 NOTE — Progress Notes (Signed)
Paramedicine Encounter    Patient ID: John Keith, male    DOB: 1951/06/26, 72 y.o.   MRN: [id number]  Arrived for home visit for Tylique to verify medications and reconcile pill box for the week.   Meds reviewed and pill box filled for one week.   No refills needed for this week.   Lev had no complaints today.   Wt- 237lbs  Appointments reviewed.   Home visit complete. I will see [name] in one week.   [name], EMT-Paramedic [phone] 07/07/2022        Patient Care Team: [name], [name], MD as PCP - General (Internal Medicine) [name], MD as PCP - Cardiology (Cardiology) [name], MD as PCP - Electrophysiology (Cardiology) [name], MD as Consulting Physician (Internal Medicine)  Patient Active Problem List   Diagnosis Date Noted   Chronic systolic heart failure ([place]) 12/15/2021   Chronic combined systolic and diastolic CHF (congestive heart failure) ([place]) 12/15/2021   Nasal sore 12/15/2021   Syncope 12/07/2021   Ventricular tachycardia ([place]) 12/07/2021   Typical atrial flutter ([place]) 09/24/2021   Secondary hypercoagulable state ([place]) 09/24/2021   Abnormal x-ray of neck 06/16/2021   Abnormal MRI, lumbar spine 10/23/2020   Chronic low back pain 10/23/2020   Neuropathy 10/23/2020   Coronary artery disease of native artery of native heart with stable angina pectoris ([place]) 10/23/2020   Impingement syndrome of left shoulder region 08/22/2020   Pain in joint of left shoulder 07/24/2020   Chronic left shoulder pain 07/11/2020   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   Anemia 12/13/2019   Chest pain of uncertain etiology 82/50/5397   PVC (premature ventricular contraction) 10/24/2019   Gastric ulcer without hemorrhage or perforation 09/28/2019   COVID-19 virus detected 07/12/2019   Elevated troponin 07/07/2019   Elevated serum protein level 07/07/2019   Postherpetic neuralgia 08/17/2018   Benign prostatic hyperplasia 08/01/2018    Hip pain 06/29/2018   Aortic atherosclerosis ([place]) 09/22/2016   Fatigue 06/03/2016   Lower urinary tract symptoms (LUTS) 06/03/2016   Constipation 03/10/2016   Lumbar radiculopathy 02/20/2016   Encounter for immunization 11/26/2015   Insomnia 06/10/2015   External hemorrhoid 03/07/2015   Health care maintenance 01/09/2014   Carpal tunnel syndrome of left wrist 01/03/2013   Hyperlipidemia 11/09/2012   Vitamin D deficiency 11/09/2012   Essential hypertension 11/06/2012   Tobacco abuse 11/06/2012   Seasonal allergies 11/06/2012   Erectile dysfunction 11/06/2012   GERD (gastroesophageal reflux disease) 11/06/2012   DDD (degenerative disc disease), thoracolumbar 11/06/2012   Lactose intolerance 11/06/2012   Diverticulosis of colon without hemorrhage 11/06/2012    Current Outpatient Medications:    amiodarone (PACERONE) 200 MG tablet, Take 1 tablet (200 mg total) by mouth daily., Disp: 30 tablet, Rfl: 5   apixaban (ELIQUIS) 5 MG TABS tablet, Take 1 tablet (5 mg total) by mouth 2 (two) times daily., Disp: 60 tablet, Rfl: 3   calcium carbonate (OS-CAL - DOSED IN MG OF ELEMENTAL CALCIUM) 1250 (500 Ca) MG tablet, Take 1 tablet (500 mg of elemental calcium total) by mouth daily with breakfast., Disp: 30 tablet, Rfl: 11   carvedilol (COREG) 6.25 MG tablet, Take 1 tablet (6.25 mg total) by mouth 2 (two) times daily with a meal., Disp: 60 tablet, Rfl: 6   Cholecalciferol (VITAMIN D-3) 125 MCG (5000 UT) TABS, Take 1 tablet by mouth daily., Disp: 30 tablet, Rfl: 11   ezetimibe (ZETIA) 10 MG tablet, Take 1 tablet (10 mg total) by mouth daily.,  Disp: 90 tablet, Rfl: 3   FARXIGA 10 MG TABS tablet, Take 1 tablet (10 mg total) by mouth daily., Disp: 90 tablet, Rfl: 3   ferrous sulfate 325 (65 FE) MG tablet, Take 325 mg by mouth daily with breakfast., Disp: , Rfl:    folic acid (FOLVITE) 1 MG tablet, Take 1 tablet (1 mg total) by mouth daily., Disp: 30 tablet, Rfl: 11   gabapentin (NEURONTIN) 300 MG  capsule, Take 300 mg by mouth daily., Disp: , Rfl:    methotrexate (RHEUMATREX) 2.5 MG tablet, Take 20 mg (8 tablets) every Wednesday. PROTECT FROM LIGHT AS DIRECTED, Disp: 40 tablet, Rfl: 3   Multiple Vitamin (MULTIVITAMIN) tablet, Take 1 tablet by mouth daily., Disp: , Rfl:    pantoprazole (PROTONIX) 40 MG tablet, Take 1 tablet (40 mg total) by mouth daily. 30 min before food, Disp: 90 tablet, Rfl: 3   sacubitril-valsartan (ENTRESTO) 24-26 MG, Take 1 tablet by mouth 2 (two) times daily., Disp: 180 tablet, Rfl: 3   spironolactone (ALDACTONE) 25 MG tablet, Take 1 tablet (25 mg total) by mouth daily., Disp: 30 tablet, Rfl: 6 Allergies  Allergen Reactions   Crestor [Rosuvastatin Calcium] Other (See Comments)    Elevated muscle enzymes    Lipitor [Atorvastatin Calcium] Other (See Comments)    Elevated muscle enzymes      Social History   Socioeconomic History   Marital status: Married    Spouse name: Gwendolyn   Number of children: 2   Years of education: Not on file   Highest education level: High school graduate  Occupational History   Occupation: Retired    Comment: Semi  Tobacco Use   Smoking status: Former    Packs/day: 0.50    Types: Cigarettes    Start date: 07/06/1971    Quit date: 06/26/2020    Years since quitting: 2.0   Smokeless tobacco: Never   Tobacco comments:    Former smoker 09/24/21  Vaping Use   Vaping Use: Never used  Substance and Sexual Activity   Alcohol use: No    Alcohol/week: 0.0 standard drinks of alcohol   Drug use: No   Sexual activity: Not on file  Other Topics Concern   Not on file  Social History Narrative   Married    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    On child    1 son, 1 daughter   Social worker op   Owns guns    Wears seat belt, safe in relationship    Smoker    Retired 06/27/2019   Social Determinants of Health   Financial Resource Strain: [place]  (08/11/2020)   Overall Financial Resource Strain (CARDIA)    Difficulty of Paying Living Expenses:  Not hard at all  Food Insecurity: No Food Insecurity (12/11/2021)   Hunger Vital Sign    Worried About Running Out of Food in the Last Year: Never true    [place] in the Last Year: Never true  Transportation Needs: No Transportation Needs (12/11/2021)   PRAPARE - Hydrologist (Medical): No    Lack of Transportation (Non-Medical): No  Physical Activity: Sufficiently Active (08/11/2020)   Exercise Vital Sign    Days of Exercise per Week: 5 days    Minutes of Exercise per Session: 30 min  Stress: No Stress Concern Present (08/11/2020)   [place]    Feeling of Stress : Not at all  Social Connections: Unknown (  08/11/2020)   Social Connection and Isolation Panel [NHANES]    Frequency of Communication with Friends and Family: More than three times a week    Frequency of Social Gatherings with Friends and Family: More than three times a week    Attends Religious Services: Not on file    Active Member of Clubs or Organizations: Yes    Attends Banker Meetings: Not on file    Marital Status: Married  Intimate Partner Violence: Not At Risk (08/11/2020)   Humiliation, Afraid, Rape, and Kick questionnaire    Fear of Current or Ex-Partner: No    Emotionally Abused: No    Physically Abused: No    Sexually Abused: No    Physical Exam      Future Appointments  Date Time Provider Department Center  07/22/2022  9:00 AM Freddie Breech, DPM [facility]  09/10/2022  7:00 AM [facility]  10/07/2022  3:00 PM Dana Allan, MD [facility]  12/10/2022  7:00 AM [facility]  03/11/2023  7:00 AM [facility]     ACTION: Home visit completed

## 2022-07-14 ENCOUNTER — Other Ambulatory Visit (HOSPITAL_COMMUNITY): Payer: Self-pay

## 2022-07-14 NOTE — Progress Notes (Signed)
Paramedicine Encounter    Patient ID: John Keith, male    DOB: December 24, 1950, 71 y.o.   MRN: [id number]   Complaints- NONE   Assessment- A&Ox4, No weight gain, no lower leg swelling, no abdominal distention, lungs clear.   Compliance with meds-no missed doses in last week.   Pill box filled- for one week.   Refills needed- eliquis samples needed (pending BMS app)  Meds changes since last visit- none     Social changes-none    BP 110/70   Pulse 62   Resp 16   Wt 236 lb (107 kg)   SpO2 94%   BMI 25.29 kg/m  Weight yesterday-237lbs Last visit weight-236lbs  Arrived for home visit for John Keith who reports to be feeling fine with no complaints. He states that he has had no episodes of chest pain, dizziness. 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 He said it subsided on it's own. He missed no doses of meds over the last week. Vitals obtained and reviewed. Pill box filled for one week. No refills needed. No lower leg edema, no abdominal distention, no JVD. Lungs clear. No weight gain. Appointments reviewed and confirmed. Home visit complete. I will see Kadarius in one week.    [name], EMT-Paramedic [phone]     Patient Care Team: [name], Pasty Spillers, MD as PCP - General (Internal Medicine) Chilton Si, MD as PCP - Cardiology (Cardiology) Regan Lemming, MD as PCP - Electrophysiology (Cardiology) Pincus Sanes, MD as Consulting Physician (Internal Medicine)  Patient Active Problem List   Diagnosis Date Noted   Chronic systolic heart failure (HCC) 12/15/2021   Chronic combined systolic and diastolic CHF (congestive heart failure) (HCC) 12/15/2021   Nasal sore 12/15/2021   Syncope 12/07/2021   Ventricular tachycardia (HCC) 12/07/2021   Typical atrial flutter (HCC) 09/24/2021   Secondary hypercoagulable state (HCC) 09/24/2021   Abnormal  x-ray of neck 06/16/2021   Abnormal MRI, lumbar spine 10/23/2020   Chronic low back pain 10/23/2020   Neuropathy 10/23/2020   Coronary artery disease of native artery of native heart with stable angina pectoris (HCC) 10/23/2020   Impingement syndrome of left shoulder region 08/22/2020   Pain in joint of left shoulder 07/24/2020   Chronic left shoulder pain 07/11/2020   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   Anemia 12/13/2019   Chest pain of uncertain etiology 10/24/2019   PVC (premature ventricular contraction) 10/24/2019   Gastric ulcer without hemorrhage or perforation 09/28/2019   COVID-19 virus detected 07/12/2019   Elevated troponin 07/07/2019   Elevated serum protein level 07/07/2019   Postherpetic neuralgia 08/17/2018   Benign prostatic hyperplasia 08/01/2018   Hip pain 06/29/2018   Aortic atherosclerosis (HCC) 09/22/2016   Fatigue 06/03/2016   Lower urinary tract symptoms (LUTS) 06/03/2016   Constipation 03/10/2016   Lumbar radiculopathy 02/20/2016   Encounter for immunization 11/26/2015   Insomnia 06/10/2015   External hemorrhoid 03/07/2015   Health care maintenance 01/09/2014   Carpal tunnel syndrome of left wrist 01/03/2013   Hyperlipidemia 11/09/2012   Vitamin D deficiency 11/09/2012   Essential hypertension 11/06/2012   Tobacco abuse 11/06/2012   Seasonal allergies 11/06/2012   Erectile dysfunction 11/06/2012   GERD (gastroesophageal reflux disease) 11/06/2012   DDD (degenerative disc disease), thoracolumbar 11/06/2012   Lactose intolerance 11/06/2012   Diverticulosis of colon without hemorrhage 11/06/2012    Current Outpatient Medications:  amiodarone (PACERONE) 200 MG tablet, Take 1 tablet (200 mg total) by mouth daily., Disp: 30 tablet, Rfl: 5   apixaban (ELIQUIS) 5 MG TABS tablet, Take 1 tablet (5 mg total) by mouth 2 (two) times daily., Disp: 60 tablet, Rfl: 3   calcium carbonate (OS-CAL - DOSED IN MG OF ELEMENTAL CALCIUM) 1250 (500 Ca) MG tablet, Take 1  tablet (500 mg of elemental calcium total) by mouth daily with breakfast., Disp: 30 tablet, Rfl: 11   carvedilol (COREG) 6.25 MG tablet, Take 1 tablet (6.25 mg total) by mouth 2 (two) times daily with a meal., Disp: 60 tablet, Rfl: 6   Cholecalciferol (VITAMIN D-3) 125 MCG (5000 UT) TABS, Take 1 tablet by mouth daily., Disp: 30 tablet, Rfl: 11   ezetimibe (ZETIA) 10 MG tablet, Take 1 tablet (10 mg total) by mouth daily., Disp: 90 tablet, Rfl: 3   FARXIGA 10 MG TABS tablet, Take 1 tablet (10 mg total) by mouth daily., Disp: 90 tablet, Rfl: 3   ferrous sulfate 325 (65 FE) MG tablet, Take 325 mg by mouth daily with breakfast., Disp: , Rfl:    folic acid (FOLVITE) 1 MG tablet, Take 1 tablet (1 mg total) by mouth daily., Disp: 30 tablet, Rfl: 11   gabapentin (NEURONTIN) 300 MG capsule, Take 300 mg by mouth daily., Disp: , Rfl:    methotrexate (RHEUMATREX) 2.5 MG tablet, Take 20 mg (8 tablets) every Wednesday. PROTECT FROM LIGHT AS DIRECTED, Disp: 40 tablet, Rfl: 3   Multiple Vitamin (MULTIVITAMIN) tablet, Take 1 tablet by mouth daily., Disp: , Rfl:    pantoprazole (PROTONIX) 40 MG tablet, Take 1 tablet (40 mg total) by mouth daily. 30 min before food, Disp: 90 tablet, Rfl: 3   sacubitril-valsartan (ENTRESTO) 24-26 MG, Take 1 tablet by mouth 2 (two) times daily., Disp: 180 tablet, Rfl: 3   spironolactone (ALDACTONE) 25 MG tablet, Take 1 tablet (25 mg total) by mouth daily., Disp: 30 tablet, Rfl: 6 Allergies  Allergen Reactions   Crestor [Rosuvastatin Calcium] Other (See Comments)    Elevated muscle enzymes    Lipitor [Atorvastatin Calcium] Other (See Comments)    Elevated muscle enzymes      Social History   Socioeconomic History   Marital status: Married    Spouse name: Gwendolyn   Number of children: 2   Years of education: Not on file   Highest education level: High school graduate  Occupational History   Occupation: Retired    Comment: Semi  Tobacco Use   Smoking status: Former     Packs/day: 0.50    Types: Cigarettes    Start date: 07/06/1971    Quit date: 06/26/2020    Years since quitting: 2.0   Smokeless tobacco: Never   Tobacco comments:    Former smoker 09/24/21  Vaping Use   Vaping Use: Never used  Substance and Sexual Activity   Alcohol use: No    Alcohol/week: 0.0 standard drinks of alcohol   Drug use: No   Sexual activity: Not on file  Other Topics Concern   Not on file  Social History Narrative   Married    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    On child    1 son, 1 daughter   Machine op   Owns guns    Wears seat belt, safe in relationship    Smoker    Retired 06/27/2019   Social Determinants of Health   Financial Resource Strain: [place]  (08/11/2020)   Overall Emergency planning/management officer Strain (  CARDIA)    Difficulty of Paying Living Expenses: Not hard at all  Food Insecurity: No Food Insecurity (12/11/2021)   Hunger Vital Sign    Worried About Running Out of Food in the Last Year: Never true    Ran Out of Food in the Last Year: Never true  Transportation Needs: No Transportation Needs (12/11/2021)   PRAPARE - Hydrologist (Medical): No    Lack of Transportation (Non-Medical): No  Physical Activity: Sufficiently Active (08/11/2020)   Exercise Vital Sign    Days of Exercise per Week: 5 days    Minutes of Exercise per Session: 30 min  Stress: No Stress Concern Present (08/11/2020)   [place]    Feeling of Stress : Not at all  Social Connections: Unknown (08/11/2020)   Social Connection and Isolation Panel [NHANES]    Frequency of Communication with Friends and Family: More than three times a week    Frequency of Social Gatherings with Friends and Family: More than three times a week    Attends Religious Services: Not on file    Active Member of Clubs or Organizations: Yes    Attends Archivist Meetings: Not on file    Marital Status: Married  Intimate Partner  Violence: Not At Risk (08/11/2020)   Humiliation, Afraid, Rape, and Kick questionnaire    Fear of Current or Ex-Partner: No    Emotionally Abused: No    Physically Abused: No    Sexually Abused: No    Physical Exam      Future Appointments  Date Time Provider [place]  07/22/2022  9:00 AM Marzetta Board, DPM [facility]  09/10/2022  7:00 AM [facility] DEVICE REMOTES [facility]  10/07/2022  3:00 PM Carollee Leitz, MD [facility]  12/10/2022  7:00 AM [facility] DEVICE REMOTES [facility]  03/11/2023  7:00 AM [facility] DEVICE [facility]OTES [facility]        ACTION: Home visit completed

## 2022-07-21 ENCOUNTER — Other Ambulatory Visit (HOSPITAL_COMMUNITY): Payer: Self-pay

## 2022-07-21 NOTE — Progress Notes (Signed)
Paramedicine Encounter    Patient ID: John Keith, male    DOB: 31-Mar-1951, 72 y.o.   MRN: [id number]   Complaints- NONE   Assessment- A&OX4, warm and dry, no lower leg edema, lungs clear, no complaints of dizziness, chest pain, shortness of breath, swelling or weight gain.   Compliance with meds-100% for the last week.   Pill box filled- for one week.   Refills needed- needs Eliquis samples.  Meds changes since last visit- NONE     Social changes- NONE    BP 120/72   Pulse 68   Resp 16   Wt 236 lb (107 kg)   SpO2 94%   BMI 25.29 kg/m  Weight yesterday-235.5lbs Last visit weight-236lbs Weight today- 236lbs   Arrived for home visit for John Keith who reports to be feeling well today. He denied any complaints. Denied any episodes of shortness of breath, dizziness, chest pain, weight gain or swelling.   He took all meds over the last week with no missed doses.   He needs Eliquis samples- we have sent in PAP and are awaiting response.  I will obtain samples in the clinic and will deliver next week during our visit.   I filled pill box for next week.   Vitals as noted.   Appointments reviewed.   I will see John Keith in one week. Visit complete.    [name], [place]  ACTION: Home visit completed    Patient Care Team: [name], [name], MD as PCP - General (Internal Medicine) [name], MD as PCP - Cardiology (Cardiology) [name], MD as PCP - Electrophysiology (Cardiology) [name], MD as Consulting Physician (Internal Medicine)  Patient Active Problem List   Diagnosis Date Noted   Chronic systolic heart failure ([place]) 12/15/2021   Chronic combined systolic and diastolic CHF (congestive heart failure) ([place]) 12/15/2021   Nasal sore 12/15/2021   Syncope 12/07/2021   Ventricular tachycardia ([place]) 12/07/2021   Typical atrial flutter ([place]) 09/24/2021   Secondary hypercoagulable state ([place]) 09/24/2021    Abnormal x-ray of neck 06/16/2021   Abnormal MRI, lumbar spine 10/23/2020   Chronic low back pain 10/23/2020   Neuropathy 10/23/2020   Coronary artery disease of native artery of native heart with stable angina pectoris ([place]) 10/23/2020   Impingement syndrome of left shoulder region 08/22/2020   Pain in joint of left shoulder 07/24/2020   Chronic left shoulder pain 07/11/2020   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   Anemia 12/13/2019   Chest pain of uncertain etiology 09/98/3382   PVC (premature ventricular contraction) 10/24/2019   Gastric ulcer without hemorrhage or perforation 09/28/2019   COVID-19 virus detected 07/12/2019   Elevated troponin 07/07/2019   Elevated serum protein level 07/07/2019   Postherpetic neuralgia 08/17/2018   Benign prostatic hyperplasia 08/01/2018   Hip pain 06/29/2018   Aortic atherosclerosis ([place]) 09/22/2016   Fatigue 06/03/2016   Lower urinary tract symptoms (LUTS) 06/03/2016   Constipation 03/10/2016   Lumbar radiculopathy 02/20/2016   Encounter for immunization 11/26/2015   Insomnia 06/10/2015   External hemorrhoid 03/07/2015   Health care maintenance 01/09/2014   Carpal tunnel syndrome of left wrist 01/03/2013   Hyperlipidemia 11/09/2012   Vitamin D deficiency 11/09/2012   Essential hypertension 11/06/2012   Tobacco abuse 11/06/2012   Seasonal allergies 11/06/2012   Erectile dysfunction 11/06/2012   GERD (gastroesophageal reflux disease) 11/06/2012   DDD (degenerative disc disease), thoracolumbar 11/06/2012   Lactose intolerance 11/06/2012   Diverticulosis of colon without hemorrhage 11/06/2012  Current Outpatient Medications:    amiodarone (PACERONE) 200 MG tablet, Take 1 tablet (200 mg total) by mouth daily., Disp: 30 tablet, Rfl: 5   apixaban (ELIQUIS) 5 MG TABS tablet, Take 1 tablet (5 mg total) by mouth 2 (two) times daily., Disp: 60 tablet, Rfl: 3   calcium carbonate (OS-CAL - DOSED IN MG OF ELEMENTAL CALCIUM) 1250 (500 Ca) MG tablet,  Take 1 tablet (500 mg of elemental calcium total) by mouth daily with breakfast., Disp: 30 tablet, Rfl: 11   carvedilol (COREG) 6.25 MG tablet, Take 1 tablet (6.25 mg total) by mouth 2 (two) times daily with a meal., Disp: 60 tablet, Rfl: 6   Cholecalciferol (VITAMIN D-3) 125 MCG (5000 UT) TABS, Take 1 tablet by mouth daily., Disp: 30 tablet, Rfl: 11   ezetimibe (ZETIA) 10 MG tablet, Take 1 tablet (10 mg total) by mouth daily., Disp: 90 tablet, Rfl: 3   FARXIGA 10 MG TABS tablet, Take 1 tablet (10 mg total) by mouth daily., Disp: 90 tablet, Rfl: 3   ferrous sulfate 325 (65 FE) MG tablet, Take 325 mg by mouth daily with breakfast., Disp: , Rfl:    folic acid (FOLVITE) 1 MG tablet, Take 1 tablet (1 mg total) by mouth daily., Disp: 30 tablet, Rfl: 11   gabapentin (NEURONTIN) 300 MG capsule, Take 300 mg by mouth daily., Disp: , Rfl:    methotrexate (RHEUMATREX) 2.5 MG tablet, Take 20 mg (8 tablets) every Wednesday. PROTECT FROM LIGHT AS DIRECTED, Disp: 40 tablet, Rfl: 3   Multiple Vitamin (MULTIVITAMIN) tablet, Take 1 tablet by mouth daily., Disp: , Rfl:    pantoprazole (PROTONIX) 40 MG tablet, Take 1 tablet (40 mg total) by mouth daily. 30 min before food, Disp: 90 tablet, Rfl: 3   sacubitril-valsartan (ENTRESTO) 24-26 MG, Take 1 tablet by mouth 2 (two) times daily., Disp: 180 tablet, Rfl: 3   spironolactone (ALDACTONE) 25 MG tablet, Take 1 tablet (25 mg total) by mouth daily., Disp: 30 tablet, Rfl: 6 Allergies  Allergen Reactions   Crestor [Rosuvastatin Calcium] Other (See Comments)    Elevated muscle enzymes    Lipitor [Atorvastatin Calcium] Other (See Comments)    Elevated muscle enzymes      Social History   Socioeconomic History   Marital status: Married    Spouse name: Gwendolyn   Number of children: 2   Years of education: Not on file   Highest education level: High school graduate  Occupational History   Occupation: Retired    Comment: Semi  Tobacco Use   Smoking status:  Former    Packs/day: 0.50    Types: Cigarettes    Start date: 07/06/1971    Quit date: 06/26/2020    Years since quitting: 2.0   Smokeless tobacco: Never   Tobacco comments:    Former smoker 09/24/21  Vaping Use   Vaping Use: Never used  Substance and Sexual Activity   Alcohol use: No    Alcohol/week: 0.0 standard drinks of alcohol   Drug use: No   Sexual activity: Not on file  Other Topics Concern   Not on file  Social History Narrative   Married    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    On child    1 son, 1 daughter   Machine op   Owns guns    Wears seat belt, safe in relationship    Smoker    Retired 06/27/2019   Social Determinants of Health   Financial Resource Strain: Low Risk  (08/11/2020)  Overall Financial Resource Strain (CARDIA)    Difficulty of Paying Living Expenses: Not hard at all  Food Insecurity: No Food Insecurity (12/11/2021)   Hunger Vital Sign    Worried About Running Out of Food in the Last Year: Never true    Ran Out of Food in the Last Year: Never true  Transportation Needs: No Transportation Needs (12/11/2021)   PRAPARE - Hydrologist (Medical): No    Lack of Transportation (Non-Medical): No  Physical Activity: Sufficiently Active (08/11/2020)   Exercise Vital Sign    Days of Exercise per Week: 5 days    Minutes of Exercise per Session: 30 min  Stress: No Stress Concern Present (08/11/2020)   [place]    Feeling of Stress : Not at all  Social Connections: Unknown (08/11/2020)   Social Connection and Isolation Panel [NHANES]    Frequency of Communication with Friends and Family: More than three times a week    Frequency of Social Gatherings with Friends and Family: More than three times a week    Attends Religious Services: Not on file    Active Member of Clubs or Organizations: Yes    Attends Archivist Meetings: Not on file    Marital Status: Married  Intimate  Partner Violence: Not At Risk (08/11/2020)   Humiliation, Afraid, Rape, and Kick questionnaire    Fear of Current or Ex-Partner: No    Emotionally Abused: No    Physically Abused: No    Sexually Abused: No    Physical Exam      Future Appointments  Date Time Provider [place]  07/22/2022  9:00 AM Marzetta Board, DPM [facility]  09/10/2022  7:00 AM [facility]ICE [facility]OT[facility]  10/07/2022  3:00 PM Carollee Leitz, MD [facility]  10/14/2022  9:00 AM Marzetta Board, DPM [facility]  12/10/2022  7:00 AM [facility]ICE [facility]  03/11/2023  7:00 AM [facility]

## 2022-07-22 ENCOUNTER — Encounter: Payer: Self-pay | Admitting: Podiatry

## 2022-07-22 ENCOUNTER — Ambulatory Visit: Payer: PPO | Admitting: Podiatry

## 2022-07-22 VITALS — BP 116/72 | HR 79

## 2022-07-22 DIAGNOSIS — M79676 Pain in unspecified toe(s): Secondary | ICD-10-CM | POA: Diagnosis not present

## 2022-07-22 DIAGNOSIS — G629 Polyneuropathy, unspecified: Secondary | ICD-10-CM | POA: Diagnosis not present

## 2022-07-22 DIAGNOSIS — Q828 Other specified congenital malformations of skin: Secondary | ICD-10-CM

## 2022-07-22 DIAGNOSIS — L84 Corns and callosities: Secondary | ICD-10-CM

## 2022-07-22 DIAGNOSIS — B351 Tinea unguium: Secondary | ICD-10-CM

## 2022-07-22 NOTE — Progress Notes (Signed)
  Subjective:  Patient ID: John Keith, male    DOB: Aug 08, 1950,  MRN: [id number]  John Keith presents to clinic today for at risk foot care with history of peripheral neuropathy and callus(es) right lower extremity and painful thick toenails that are difficult to trim. Painful toenails interfere with ambulation. Aggravating factors include wearing enclosed shoe gear. Pain is relieved with periodic professional debridement. Painful calluses are aggravated when weightbearing with and without shoegear. Pain is relieved with periodic professional debridement.  Chief Complaint  Patient presents with   Nail Problem    Routine foot care, nail trim, Last seen PCP 01/28/2022   New problem(s): None.   PCP is [name], [name], MD.  Allergies  Allergen Reactions   Crestor [Rosuvastatin Calcium] Other (See Comments)    Elevated muscle enzymes    Lipitor [Atorvastatin Calcium] Other (See Comments)    Elevated muscle enzymes     Review of Systems: Negative except as noted in the HPI.  Objective: No changes noted in today's physical examination. Vitals:   07/22/22 0849  BP: 116/72  Pulse: John Keith is a pleasant 72 y.o. male WD, WN in NAD. AAO x 3.  Neurovascular Examination: Capillary refill time to digits immediate b/l. Palpable pedal pulses b/l LE. Pedal hair present. No pain with calf compression b/l. Lower extremity skin temperature gradient within normal limits. No cyanosis or clubbing noted b/l LE.  Pt has subjective symptoms of neuropathy. Protective sensation intact 5/5 intact bilaterally with 10g monofilament b/l. Vibratory sensation intact b/l.  Dermatological:  Pedal skin is warm and supple b/l LE. No open wounds b/l LE. No interdigital macerations noted b/l LE. Toenails 1-5 bilaterally elongated, discolored, dystrophic, thickened, and crumbly with subungual debris and tenderness to dorsal palpation.   Hyperkeratotic lesion(s) submet head 1 right foot.   No erythema, no edema, no drainage, no fluctuance.  Musculoskeletal:  Muscle strength 5/5 to all lower extremity muscle groups bilaterally. HAV with bunion deformity noted b/l LE. Hammertoe deformity noted 2-5 b/l. Patient ambulates independently without assistance.  Assessment/Plan: 1. Pain due to onychomycosis of toenail   2. Callus   3. Neuropathy    -Patient was evaluated and treated. All patient's and/or POA's questions/concerns answered on today's visit. -No new findings. No new orders. -Continue supportive shoe gear daily. -Toenails 1-5 b/l were debrided in length and girth with sterile nail nippers and dremel without iatrogenic bleeding.  -Callus(es) submet head 1 right foot pared utilizing sterile scalpel blade without complication or incident. Total number debrided =1. -Patient/POA to call should there be question/concern in the interim.   Return in about 3 months (around 10/21/2022).  Marzetta Board, DPM

## 2022-07-28 ENCOUNTER — Other Ambulatory Visit (HOSPITAL_COMMUNITY): Payer: Self-pay

## 2022-07-28 NOTE — Progress Notes (Signed)
Paramedicine Encounter    Patient ID: John Keith, male    DOB: Feb 06, 1951, 72 y.o.   MRN: [id number]   Complaints- NONE   Assessment- CAOX4, ambulatory, warm and dry, no lower leg edema, lungs clear.   Compliance with meds- 100% compliance  Pill box filled- for one week   Refills needed- NONE   Meds changes since last visit- NONE     Social changes- NONE    BP 102/70   Pulse 72   Resp 16   Wt 234 lb (106.1 kg)   SpO2 96%   BMI 25.08 kg/m  Weight yesterday-234lbs Last visit weight-236lbs Weight today- 234lbs   Arrived for home visit for Kobee who reports to be feeling well with no complaints. He denied shortness of breath, dizziness, chest pain, leg swelling or weight gain. He states he has had no side effects from medications over the last week. He was 100% compliant with pill box and had no missed doses. He is needing some Eliquis refills. He has had no approval or denial yet of his BMS application. [place] understands he may have to pay for same in the coming months due to limited samples in the HF clinic.   Lungs clear, no swelling noted. Vitals obtained and as noted.   I reviewed heart healthy diet and fluid suggestions. He agreed. We reviewed upcoming appointments and he confirmed same. I plan to see Loyce in one week. He agreed with plan. Home visit complete.    [name], [place]  ACTION: Home visit completed    Patient Care Team: [name], [name], MD as PCP - General (Internal Medicine) [name], MD as PCP - Cardiology (Cardiology) [name], MD as PCP - Electrophysiology (Cardiology) [name], MD as Consulting Physician (Internal Medicine)  Patient Active Problem List   Diagnosis Date Noted   Chronic systolic heart failure ([place]) 12/15/2021   Chronic combined systolic and diastolic CHF (congestive heart failure) ([place]) 12/15/2021   Nasal sore 12/15/2021   Syncope 12/07/2021   Ventricular  tachycardia ([place]) 12/07/2021   Typical atrial flutter ([place]) 09/24/2021   Secondary hypercoagulable state ([place]) 09/24/2021   Abnormal x-ray of neck 06/16/2021   Abnormal MRI, lumbar spine 10/23/2020   Chronic low back pain 10/23/2020   Neuropathy 10/23/2020   Coronary artery disease of native artery of native heart with stable angina pectoris ([place]) 10/23/2020   Impingement syndrome of left shoulder region 08/22/2020   Pain in joint of left shoulder 07/24/2020   Chronic left shoulder pain 07/11/2020   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   Anemia 12/13/2019   Chest pain of uncertain etiology 53/66/4403   PVC (premature ventricular contraction) 10/24/2019   Gastric ulcer without hemorrhage or perforation 09/28/2019   COVID-19 virus detected 07/12/2019   Elevated troponin 07/07/2019   Elevated serum protein level 07/07/2019   Postherpetic neuralgia 08/17/2018   Benign prostatic hyperplasia 08/01/2018   Hip pain 06/29/2018   Aortic atherosclerosis ([place]) 09/22/2016   Fatigue 06/03/2016   Lower urinary tract symptoms (LUTS) 06/03/2016   Constipation 03/10/2016   Lumbar radiculopathy 02/20/2016   Encounter for immunization 11/26/2015   Insomnia 06/10/2015   External hemorrhoid 03/07/2015   Health care maintenance 01/09/2014   Carpal tunnel syndrome of left wrist 01/03/2013   Hyperlipidemia 11/09/2012   Vitamin D deficiency 11/09/2012   Essential hypertension 11/06/2012   Tobacco abuse 11/06/2012   Seasonal allergies 11/06/2012   Erectile dysfunction 11/06/2012   GERD (gastroesophageal reflux disease) 11/06/2012   DDD (  degenerative disc disease), thoracolumbar 11/06/2012   Lactose intolerance 11/06/2012   Diverticulosis of colon without hemorrhage 11/06/2012    Current Outpatient Medications:    amiodarone (PACERONE) 200 MG tablet, Take 1 tablet (200 mg total) by mouth daily., Disp: 30 tablet, Rfl: 5   apixaban (ELIQUIS) 5 MG TABS tablet, Take 1 tablet (5 mg total) by mouth 2 (two)  times daily., Disp: 60 tablet, Rfl: 3   calcium carbonate (OS-CAL - DOSED IN MG OF ELEMENTAL CALCIUM) 1250 (500 Ca) MG tablet, Take 1 tablet (500 mg of elemental calcium total) by mouth daily with breakfast., Disp: 30 tablet, Rfl: 11   carvedilol (COREG) 6.25 MG tablet, Take 1 tablet (6.25 mg total) by mouth 2 (two) times daily with a meal., Disp: 60 tablet, Rfl: 6   Cholecalciferol (VITAMIN D-3) 125 MCG (5000 UT) TABS, Take 1 tablet by mouth daily., Disp: 30 tablet, Rfl: 11   ezetimibe (ZETIA) 10 MG tablet, Take 1 tablet (10 mg total) by mouth daily., Disp: 90 tablet, Rfl: 3   FARXIGA 10 MG TABS tablet, Take 1 tablet (10 mg total) by mouth daily., Disp: 90 tablet, Rfl: 3   ferrous sulfate 325 (65 FE) MG tablet, Take 325 mg by mouth daily with breakfast., Disp: , Rfl:    folic acid (FOLVITE) 1 MG tablet, Take 1 tablet (1 mg total) by mouth daily., Disp: 30 tablet, Rfl: 11   gabapentin (NEURONTIN) 300 MG capsule, Take 300 mg by mouth daily., Disp: , Rfl:    methotrexate (RHEUMATREX) 2.5 MG tablet, Take 20 mg (8 tablets) every Wednesday. PROTECT FROM LIGHT AS DIRECTED, Disp: 40 tablet, Rfl: 3   Multiple Vitamin (MULTIVITAMIN) tablet, Take 1 tablet by mouth daily., Disp: , Rfl:    pantoprazole (PROTONIX) 40 MG tablet, Take 1 tablet (40 mg total) by mouth daily. 30 min before food, Disp: 90 tablet, Rfl: 3   sacubitril-valsartan (ENTRESTO) 24-26 MG, Take 1 tablet by mouth 2 (two) times daily., Disp: 180 tablet, Rfl: 3   spironolactone (ALDACTONE) 25 MG tablet, Take 1 tablet (25 mg total) by mouth daily., Disp: 30 tablet, Rfl: 6 Allergies  Allergen Reactions   Crestor [Rosuvastatin Calcium] Other (See Comments)    Elevated muscle enzymes    Lipitor [Atorvastatin Calcium] Other (See Comments)    Elevated muscle enzymes      Social History   Socioeconomic History   Marital status: Married    Spouse name: Gwendolyn   Number of children: 2   Years of education: Not on file   Highest education  level: High school graduate  Occupational History   Occupation: Retired    Comment: Semi  Tobacco Use   Smoking status: Former    Packs/day: 0.50    Types: Cigarettes    Start date: 07/06/1971    Quit date: 06/26/2020    Years since quitting: 2.0   Smokeless tobacco: Never   Tobacco comments:    Former smoker 09/24/21  Vaping Use   Vaping Use: Never used  Substance and Sexual Activity   Alcohol use: No    Alcohol/week: 0.0 standard drinks of alcohol   Drug use: No   Sexual activity: Not on file  Other Topics Concern   Not on file  Social History Narrative   Married    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    On child    1 son, 1 daughter   Social worker op   Owns guns    Wears seat belt, safe in relationship  Smoker    Retired 06/27/2019   Social Determinants of Health   Financial Resource Strain: Low Risk  (08/11/2020)   Overall Financial Resource Strain (CARDIA)    Difficulty of Paying Living Expenses: Not hard at all  Food Insecurity: No Food Insecurity (12/11/2021)   Hunger Vital Sign    Worried About Running Out of Food in the Last Year: Never true    Ran Out of Food in the Last Year: Never true  Transportation Needs: No Transportation Needs (12/11/2021)   PRAPARE - Administrator, Civil Service (Medical): No    Lack of Transportation (Non-Medical): No  Physical Activity: Sufficiently Active (08/11/2020)   Exercise Vital Sign    Days of Exercise per Week: 5 days    Minutes of Exercise per Session: 30 min  Stress: No Stress Concern Present (08/11/2020)   [employer] of Occupational Health - Occupational Stress Questionnaire    Feeling of Stress : Not at all  Social Connections: Unknown (08/11/2020)   Social Connection and Isolation Panel [NHANES]    Frequency of Communication with Friends and Family: More than three times a week    Frequency of Social Gatherings with Friends and Family: More than three times a week    Attends Religious Services: Not on file    Active Member of  Clubs or Organizations: Yes    Attends Banker Meetings: Not on file    Marital Status: Married  Intimate Partner Violence: Not At Risk (08/11/2020)   Humiliation, Afraid, Rape, and Kick questionnaire    Fear of Current or Ex-Partner: No    Emotionally Abused: No    Physically Abused: No    Sexually Abused: No    Physical Exam      Future Appointments  Date Time Provider Department Center  09/10/2022  7:00 AM [facility]  10/07/2022  3:00 PM Dana Allan, MD [facility]  10/14/2022  9:00 AM Freddie Breech, DPM [facility]  12/10/2022  7:00 AM [facility]  03/11/2023  7:00 AM [facility]

## 2022-08-04 ENCOUNTER — Other Ambulatory Visit (HOSPITAL_COMMUNITY): Payer: Self-pay

## 2022-08-04 NOTE — Progress Notes (Signed)
Paramedicine Encounter    Patient ID: John Keith, male    DOB: 1950-08-23, 72 y.o.   MRN: [id number]   Complaints- NONE  Assessment- A&Ox4, warm and dry, no swelling, clear lungs.   Compliance with meds- no missed doses in one week.   Pill box filled- for one week.   Refills needed- NONE  Meds changes since last visit- NONE     Social changes- NONE    There were no vitals taken for this visit. Weight yesterday-237lbs  Last visit weight-234lbs  Weight today- 237lbs   Arrived for home visit for [name] who reports to be feeling well today with no complaints. He states he has not been sleeping well so he decided to go back to taking 3 gabapentin at bedtime as prescribed by PCP.   Vitals and assessment obtained.  No swelling, lungs clear, 3lbs weight gain over the last week but states his appetite has been better this week. He had no missed doses of his meds over the last week.   I filled pill box for one week. NO refills needed.   Appointments reviewed and confirmed.  I will see John Keith in one week.     [name], [place]  ACTION: Home visit completed    Patient Care Team: [name], [name], MD (Inactive) as PCP - General (Internal Medicine) [name], MD as PCP - Cardiology (Cardiology) [name], MD as PCP - Electrophysiology (Cardiology) [name], MD as Consulting Physician (Internal Medicine)  Patient Active Problem List   Diagnosis Date Noted   Chronic systolic heart failure ([place]) 12/15/2021   Chronic combined systolic and diastolic CHF (congestive heart failure) ([place]) 12/15/2021   Nasal sore 12/15/2021   Syncope 12/07/2021   Ventricular tachycardia ([place]) 12/07/2021   Typical atrial flutter ([place]) 09/24/2021   Secondary hypercoagulable state ([place]) 09/24/2021   Abnormal x-ray of neck 06/16/2021   Abnormal MRI, lumbar spine 10/23/2020   Chronic low back pain 10/23/2020   Neuropathy 10/23/2020    Coronary artery disease of native artery of native heart with stable angina pectoris ([place]) 10/23/2020   Impingement syndrome of left shoulder region 08/22/2020   Pain in joint of left shoulder 07/24/2020   Chronic left shoulder pain 07/11/2020   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   Anemia 12/13/2019   Chest pain of uncertain etiology 40/98/1191   PVC (premature ventricular contraction) 10/24/2019   Gastric ulcer without hemorrhage or perforation 09/28/2019   COVID-19 virus detected 07/12/2019   Elevated troponin 07/07/2019   Elevated serum protein level 07/07/2019   Postherpetic neuralgia 08/17/2018   Benign prostatic hyperplasia 08/01/2018   Hip pain 06/29/2018   Aortic atherosclerosis ([place]) 09/22/2016   Fatigue 06/03/2016   Lower urinary tract symptoms (LUTS) 06/03/2016   Constipation 03/10/2016   Lumbar radiculopathy 02/20/2016   Encounter for immunization 11/26/2015   Insomnia 06/10/2015   External hemorrhoid 03/07/2015   Health care maintenance 01/09/2014   Carpal tunnel syndrome of left wrist 01/03/2013   Hyperlipidemia 11/09/2012   Vitamin D deficiency 11/09/2012   Essential hypertension 11/06/2012   Tobacco abuse 11/06/2012   Seasonal allergies 11/06/2012   Erectile dysfunction 11/06/2012   GERD (gastroesophageal reflux disease) 11/06/2012   DDD (degenerative disc disease), thoracolumbar 11/06/2012   Lactose intolerance 11/06/2012   Diverticulosis of colon without hemorrhage 11/06/2012    Current Outpatient Medications:    amiodarone (PACERONE) 200 MG tablet, Take 1 tablet (200 mg total) by mouth daily., Disp: 30 tablet, Rfl: 5   apixaban (ELIQUIS)  5 MG TABS tablet, Take 1 tablet (5 mg total) by mouth 2 (two) times daily., Disp: 60 tablet, Rfl: 3   calcium carbonate (OS-CAL - DOSED IN MG OF ELEMENTAL CALCIUM) 1250 (500 Ca) MG tablet, Take 1 tablet (500 mg of elemental calcium total) by mouth daily with breakfast., Disp: 30 tablet, Rfl: 11   carvedilol (COREG) 6.25 MG  tablet, Take 1 tablet (6.25 mg total) by mouth 2 (two) times daily with a meal., Disp: 60 tablet, Rfl: 6   Cholecalciferol (VITAMIN D-3) 125 MCG (5000 UT) TABS, Take 1 tablet by mouth daily., Disp: 30 tablet, Rfl: 11   ezetimibe (ZETIA) 10 MG tablet, Take 1 tablet (10 mg total) by mouth daily., Disp: 90 tablet, Rfl: 3   FARXIGA 10 MG TABS tablet, Take 1 tablet (10 mg total) by mouth daily., Disp: 90 tablet, Rfl: 3   ferrous sulfate 325 (65 FE) MG tablet, Take 325 mg by mouth daily with breakfast., Disp: , Rfl:    folic acid (FOLVITE) 1 MG tablet, Take 1 tablet (1 mg total) by mouth daily., Disp: 30 tablet, Rfl: 11   gabapentin (NEURONTIN) 300 MG capsule, Take 300 mg by mouth daily., Disp: , Rfl:    methotrexate (RHEUMATREX) 2.5 MG tablet, Take 20 mg (8 tablets) every Wednesday. PROTECT FROM LIGHT AS DIRECTED, Disp: 40 tablet, Rfl: 3   Multiple Vitamin (MULTIVITAMIN) tablet, Take 1 tablet by mouth daily., Disp: , Rfl:    pantoprazole (PROTONIX) 40 MG tablet, Take 1 tablet (40 mg total) by mouth daily. 30 min before food, Disp: 90 tablet, Rfl: 3   sacubitril-valsartan (ENTRESTO) 24-26 MG, Take 1 tablet by mouth 2 (two) times daily., Disp: 180 tablet, Rfl: 3   spironolactone (ALDACTONE) 25 MG tablet, Take 1 tablet (25 mg total) by mouth daily., Disp: 30 tablet, Rfl: 6 Allergies  Allergen Reactions   Crestor [Rosuvastatin Calcium] Other (See Comments)    Elevated muscle enzymes    Lipitor [Atorvastatin Calcium] Other (See Comments)    Elevated muscle enzymes      Social History   Socioeconomic History   Marital status: Married    Spouse name: John Keith   Number of children: 2   Years of education: Not on file   Highest education level: High school graduate  Occupational History   Occupation: Retired    Comment: Semi  Tobacco Use   Smoking status: Former    Packs/day: 0.50    Types: Cigarettes    Start date: 07/06/1971    Quit date: 06/26/2020    Years since quitting: 2.1    Smokeless tobacco: Never   Tobacco comments:    Former smoker 09/24/21  Vaping Use   Vaping Use: Never used  Substance and Sexual Activity   Alcohol use: No    Alcohol/week: 0.0 standard drinks of alcohol   Drug use: No   Sexual activity: Not on file  Other Topics Concern   Not on file  Social History Narrative   Married    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    On child    1 son, 1 daughter   Machine op   Owns guns    Wears seat belt, safe in relationship    Smoker    Retired 06/27/2019   Social Determinants of Health   Financial Resource Strain: [place]  (08/11/2020)   Overall Financial Resource Strain (CARDIA)    Difficulty of Paying Living Expenses: Not hard at all  Food Insecurity: No Food Insecurity (12/11/2021)   Hunger  Vital Sign    Worried About Charity fundraiser in the Last Year: Never true    Ran Out of Food in the Last Year: Never true  Transportation Needs: No Transportation Needs (12/11/2021)   PRAPARE - Hydrologist (Medical): No    Lack of Transportation (Non-Medical): No  Physical Activity: Sufficiently Active (08/11/2020)   Exercise Vital Sign    Days of Exercise per Week: 5 days    Minutes of Exercise per Session: 30 min  Stress: No Stress Concern Present (08/11/2020)   [place]    Feeling of Stress : Not at all  Social Connections: Unknown (08/11/2020)   Social Connection and Isolation Panel [NHANES]    Frequency of Communication with Friends and Family: More than three times a week    Frequency of Social Gatherings with Friends and Family: More than three times a week    Attends Religious Services: Not on file    Active Member of Clubs or Organizations: Yes    Attends Archivist Meetings: Not on file    Marital Status: Married  Intimate Partner Violence: Not At Risk (08/11/2020)   Humiliation, Afraid, Rape, and Kick questionnaire    Fear of Current or Ex-Partner: No     Emotionally Abused: No    Physically Abused: No    Sexually Abused: No    Physical Exam      Future Appointments  Date Time Provider [place]  09/10/2022  7:00 AM [facility]  10/07/2022  3:00 PM Carollee Leitz, MD [facility]  10/14/2022  9:00 AM Marzetta Board, DPM [facility]  12/10/2022  7:00 AM [facility]  03/11/2023  7:00 AM [facility]

## 2022-08-05 DIAGNOSIS — J349 Unspecified disorder of nose and nasal sinuses: Secondary | ICD-10-CM | POA: Diagnosis not present

## 2022-08-05 DIAGNOSIS — R040 Epistaxis: Secondary | ICD-10-CM | POA: Diagnosis not present

## 2022-08-11 ENCOUNTER — Other Ambulatory Visit (HOSPITAL_COMMUNITY): Payer: Self-pay

## 2022-08-11 NOTE — Progress Notes (Signed)
Paramedicine Encounter    Patient ID: John Keith, male    DOB: 02/02/1951, 72 y.o.   MRN: [id number]   Complaints- NONE   Assessment- A&Ox4, warm and dry, no complaints, no shortness of breath, no chest pain, no dizziness, no weight gain, no swelling. Lungs clear.   Compliance with meds- no missed doses   Pill box filled for two weeks   Refills needed- Eliquis, Iron OTC, Gabapentin, Zetia, Metolazone   Meds changes since last visit- NONE     Social changes- NONE    BP 120/68   Pulse 69   Resp 16   Wt 237 lb (107.5 kg)   SpO2 94%   BMI 25.40 kg/m  Weight yesterday-237lbs Last visit weight-237lbs  Arrived for home visit for John Keith who reports to be feeling good with no complaints. He states he has had no shortness of breath, chest pain, dizziness or swelling. No weight gain over the last week. He missed no meds. Vitals obtained as noted.   I reviewed meds and filled pill box for two weeks.   We reviewed appointments. I set up HF clinic follow up for 3 month follow up per Dr. Haroldine Laws.   HF education provided. Home visit complete. I will see John Keith in two weeks.    [name], [place]  ACTION: Home visit completed    Patient Care Team: [name], [name], MD (Inactive) as PCP - General (Internal Medicine) [name], MD as PCP - Cardiology (Cardiology) [name], MD as PCP - Electrophysiology (Cardiology) [name], MD as Consulting Physician (Internal Medicine)  Patient Active Problem List   Diagnosis Date Noted   Chronic systolic heart failure ([place]) 12/15/2021   Chronic combined systolic and diastolic CHF (congestive heart failure) ([place]) 12/15/2021   Nasal sore 12/15/2021   Syncope 12/07/2021   Ventricular tachycardia ([place]) 12/07/2021   Typical atrial flutter ([place]) 09/24/2021   Secondary hypercoagulable state ([place]) 09/24/2021   Abnormal x-ray of neck 06/16/2021   Abnormal MRI, lumbar spine 10/23/2020    Chronic low back pain 10/23/2020   Neuropathy 10/23/2020   Coronary artery disease of native artery of native heart with stable angina pectoris ([place]) 10/23/2020   Impingement syndrome of left shoulder region 08/22/2020   Pain in joint of left shoulder 07/24/2020   Chronic left shoulder pain 07/11/2020   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   Anemia 12/13/2019   Chest pain of uncertain etiology 57/32/2025   PVC (premature ventricular contraction) 10/24/2019   Gastric ulcer without hemorrhage or perforation 09/28/2019   COVID-19 virus detected 07/12/2019   Elevated troponin 07/07/2019   Elevated serum protein level 07/07/2019   Postherpetic neuralgia 08/17/2018   Benign prostatic hyperplasia 08/01/2018   Hip pain 06/29/2018   Aortic atherosclerosis ([place]) 09/22/2016   Fatigue 06/03/2016   Lower urinary tract symptoms (LUTS) 06/03/2016   Constipation 03/10/2016   Lumbar radiculopathy 02/20/2016   Encounter for immunization 11/26/2015   Insomnia 06/10/2015   External hemorrhoid 03/07/2015   Health care maintenance 01/09/2014   Carpal tunnel syndrome of left wrist 01/03/2013   Hyperlipidemia 11/09/2012   Vitamin D deficiency 11/09/2012   Essential hypertension 11/06/2012   Tobacco abuse 11/06/2012   Seasonal allergies 11/06/2012   Erectile dysfunction 11/06/2012   GERD (gastroesophageal reflux disease) 11/06/2012   DDD (degenerative disc disease), thoracolumbar 11/06/2012   Lactose intolerance 11/06/2012   Diverticulosis of colon without hemorrhage 11/06/2012    Current Outpatient Medications:    amiodarone (PACERONE) 200 MG tablet, Take 1  tablet (200 mg total) by mouth daily., Disp: 30 tablet, Rfl: 5   apixaban (ELIQUIS) 5 MG TABS tablet, Take 1 tablet (5 mg total) by mouth 2 (two) times daily., Disp: 60 tablet, Rfl: 3   calcium carbonate (OS-CAL - DOSED IN MG OF ELEMENTAL CALCIUM) 1250 (500 Ca) MG tablet, Take 1 tablet (500 mg of elemental calcium total) by mouth daily with  breakfast., Disp: 30 tablet, Rfl: 11   carvedilol (COREG) 6.25 MG tablet, Take 1 tablet (6.25 mg total) by mouth 2 (two) times daily with a meal., Disp: 60 tablet, Rfl: 6   Cholecalciferol (VITAMIN D-3) 125 MCG (5000 UT) TABS, Take 1 tablet by mouth daily., Disp: 30 tablet, Rfl: 11   ezetimibe (ZETIA) 10 MG tablet, Take 1 tablet (10 mg total) by mouth daily., Disp: 90 tablet, Rfl: 3   FARXIGA 10 MG TABS tablet, Take 1 tablet (10 mg total) by mouth daily., Disp: 90 tablet, Rfl: 3   ferrous sulfate 325 (65 FE) MG tablet, Take 325 mg by mouth daily with breakfast., Disp: , Rfl:    folic acid (FOLVITE) 1 MG tablet, Take 1 tablet (1 mg total) by mouth daily., Disp: 30 tablet, Rfl: 11   gabapentin (NEURONTIN) 300 MG capsule, Take 300 mg by mouth daily., Disp: , Rfl:    methotrexate (RHEUMATREX) 2.5 MG tablet, Take 20 mg (8 tablets) every Wednesday. PROTECT FROM LIGHT AS DIRECTED, Disp: 40 tablet, Rfl: 3   Multiple Vitamin (MULTIVITAMIN) tablet, Take 1 tablet by mouth daily., Disp: , Rfl:    pantoprazole (PROTONIX) 40 MG tablet, Take 1 tablet (40 mg total) by mouth daily. 30 min before food, Disp: 90 tablet, Rfl: 3   sacubitril-valsartan (ENTRESTO) 24-26 MG, Take 1 tablet by mouth 2 (two) times daily., Disp: 180 tablet, Rfl: 3   spironolactone (ALDACTONE) 25 MG tablet, Take 1 tablet (25 mg total) by mouth daily., Disp: 30 tablet, Rfl: 6 Allergies  Allergen Reactions   Crestor [Rosuvastatin Calcium] Other (See Comments)    Elevated muscle enzymes    Lipitor [Atorvastatin Calcium] Other (See Comments)    Elevated muscle enzymes      Social History   Socioeconomic History   Marital status: Married    Spouse name: John Keith   Number of children: 2   Years of education: Not on file   Highest education level: High school graduate  Occupational History   Occupation: Retired    Comment: Semi  Tobacco Use   Smoking status: Former    Packs/day: 0.50    Types: Cigarettes    Start date: 07/06/1971     Quit date: 06/26/2020    Years since quitting: 2.1   Smokeless tobacco: Never   Tobacco comments:    Former smoker 09/24/21  Vaping Use   Vaping Use: Never used  Substance and Sexual Activity   Alcohol use: No    Alcohol/week: 0.0 standard drinks of alcohol   Drug use: No   Sexual activity: Not on file  Other Topics Concern   Not on file  Social History Narrative   Married    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    On child    1 son, 1 daughter   Machine op   Owns guns    Wears seat belt, safe in relationship    Smoker    Retired 06/27/2019   Social Determinants of Health   Financial Resource Strain: [place]  (08/11/2020)   Overall Financial Resource Strain (CARDIA)    Difficulty of Paying  Living Expenses: Not hard at all  Food Insecurity: No Food Insecurity (12/11/2021)   Hunger Vital Sign    Worried About Running Out of Food in the Last Year: Never true    Ran Out of Food in the Last Year: Never true  Transportation Needs: No Transportation Needs (12/11/2021)   PRAPARE - Hydrologist (Medical): No    Lack of Transportation (Non-Medical): No  Physical Activity: Sufficiently Active (08/11/2020)   Exercise Vital Sign    Days of Exercise per Week: 5 days    Minutes of Exercise per Session: 30 min  Stress: No Stress Concern Present (08/11/2020)   [place]    Feeling of Stress : Not at all  Social Connections: Unknown (08/11/2020)   Social Connection and Isolation Panel [NHANES]    Frequency of Communication with Friends and Family: More than three times a week    Frequency of Social Gatherings with Friends and Family: More than three times a week    Attends Religious Services: Not on file    Active Member of Clubs or Organizations: Yes    Attends Archivist Meetings: Not on file    Marital Status: Married  Intimate Partner Violence: Not At Risk (08/11/2020)   Humiliation, Afraid, Rape, and  Kick questionnaire    Fear of Current or Ex-Partner: No    Emotionally Abused: No    Physically Abused: No    Sexually Abused: No    Physical Exam      Future Appointments  Date Time Provider [place]  09/10/2022  7:00 AM [facility] DEVICE [facility]OTES [facility]  10/07/2022  3:00 PM Carollee Leitz, MD [facility]  10/14/2022  9:00 AM Marzetta Board, DPM [facility]  12/10/2022  7:00 AM [facility] DEVICE REMOTES [facility]  03/11/2023  7:00 AM [facility] DEVICE REMOTES [facility]

## 2022-08-11 NOTE — Telephone Encounter (Signed)
Advanced Heart Failure Patient Advocate Encounter  Patient will need to submit 2024 OOP in order to gain approval from [employer]. Sent update to [employer] (EMT).  Charlann Boxer, CPhT

## 2022-08-25 ENCOUNTER — Telehealth (HOSPITAL_COMMUNITY): Payer: Self-pay

## 2022-08-25 ENCOUNTER — Other Ambulatory Visit (HOSPITAL_COMMUNITY): Payer: Self-pay

## 2022-08-25 NOTE — Progress Notes (Signed)
Paramedicine Encounter    Patient ID: John Keith, male    DOB: [birth year], 72 y.o.   MRN: [id number]   Complaints- NONE   Assessment- Alert and oriented,seated with no complaints. No shortness of breath, dizziness, chest pain,   Compliance with meds- no missed doses   Pill box filled- for 2.5 weeks  Refills needed- eliquis, pantoprazole, amiodarone, folic acid   Meds changes since last visit- none     Social changes- none    BP 98/62   Pulse 70   Resp 16   Wt 236 lb 12.8 oz (107.4 kg)   SpO2 93%   BMI 25.38 kg/m  Weight yesterday-237lbs Last visit weight-236.8lbs  Arrived for home visit for John Keith who reports to be feeling good with no complaints. He denied shortness of breath, dizziness, swelling, weight gain or chest pain. He has been compliant with his medications over the last two weeks. Vitals were obtained and as noted. Assessment complete. Lungs clear, no swelling. Meds reviewed and pill box filled for 2.5 weeks. We plan to meet again at HF clinic appointment. Appointments reviewed and confirmed. Refills as noted and called into [facility].   Home visit complete.     [name], [place]  ACTION: Home visit completed    Patient Care Team: [name], MD as PCP - Cardiology (Cardiology) [name], MD as PCP - Electrophysiology (Cardiology) [name], MD as Consulting Physician (Internal Medicine)  Patient Active Problem List   Diagnosis Date Noted   Chronic systolic heart failure ([place]) 12/15/2021   Chronic combined systolic and diastolic CHF (congestive heart failure) ([place]) 12/15/2021   Nasal sore 12/15/2021   Syncope 12/07/2021   Ventricular tachycardia ([place]) 12/07/2021   Typical atrial flutter ([place]) 09/24/2021   Secondary hypercoagulable state ([place]) 09/24/2021   Abnormal x-ray of neck 06/16/2021   Abnormal MRI, lumbar spine 10/23/2020   Chronic low back pain 10/23/2020   Neuropathy 10/23/2020    Coronary artery disease of native artery of native heart with stable angina pectoris ([place]) 10/23/2020   Impingement syndrome of left shoulder region 08/22/2020   Pain in joint of left shoulder 07/24/2020   Chronic left shoulder pain 07/11/2020   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   Anemia 12/13/2019   Chest pain of uncertain etiology 99991111   PVC (premature ventricular contraction) 10/24/2019   Gastric ulcer without hemorrhage or perforation 09/28/2019   COVID-19 virus detected 07/12/2019   Elevated troponin 07/07/2019   Elevated serum protein level 07/07/2019   Postherpetic neuralgia 08/17/2018   Benign prostatic hyperplasia 08/01/2018   Hip pain 06/29/2018   Aortic atherosclerosis ([place]) 09/22/2016   Fatigue 06/03/2016   Lower urinary tract symptoms (LUTS) 06/03/2016   Constipation 03/10/2016   Lumbar radiculopathy 02/20/2016   Encounter for immunization 11/26/2015   Insomnia 06/10/2015   External hemorrhoid 03/07/2015   Health care maintenance 01/09/2014   Carpal tunnel syndrome of left wrist 01/03/2013   Hyperlipidemia 11/09/2012   Vitamin D deficiency 11/09/2012   Essential hypertension 11/06/2012   Tobacco abuse 11/06/2012   Seasonal allergies 11/06/2012   Erectile dysfunction 11/06/2012   GERD (gastroesophageal reflux disease) 11/06/2012   DDD (degenerative disc disease), thoracolumbar 11/06/2012   Lactose intolerance 11/06/2012   Diverticulosis of colon without hemorrhage 11/06/2012    Current Outpatient Medications:    amiodarone (PACERONE) 200 MG tablet, Take 1 tablet (200 mg total) by mouth daily., Disp: 30 tablet, Rfl: 5   apixaban (ELIQUIS) 5 MG TABS tablet, Take 1 tablet (5 mg  total) by mouth 2 (two) times daily., Disp: 60 tablet, Rfl: 3   calcium carbonate (OS-CAL - DOSED IN MG OF ELEMENTAL CALCIUM) 1250 (500 Ca) MG tablet, Take 1 tablet (500 mg of elemental calcium total) by mouth daily with breakfast., Disp: 30 tablet, Rfl: 11   carvedilol (COREG) 6.25 MG  tablet, Take 1 tablet (6.25 mg total) by mouth 2 (two) times daily with a meal., Disp: 60 tablet, Rfl: 6   Cholecalciferol (VITAMIN D-3) 125 MCG (5000 UT) TABS, Take 1 tablet by mouth daily., Disp: 30 tablet, Rfl: 11   ezetimibe (ZETIA) 10 MG tablet, Take 1 tablet (10 mg total) by mouth daily., Disp: 90 tablet, Rfl: 3   FARXIGA 10 MG TABS tablet, Take 1 tablet (10 mg total) by mouth daily., Disp: 90 tablet, Rfl: 3   ferrous sulfate 325 (65 FE) MG tablet, Take 325 mg by mouth daily with breakfast., Disp: , Rfl:    folic acid (FOLVITE) 1 MG tablet, Take 1 tablet (1 mg total) by mouth daily., Disp: 30 tablet, Rfl: 11   gabapentin (NEURONTIN) 300 MG capsule, Take 300 mg by mouth daily., Disp: , Rfl:    methotrexate (RHEUMATREX) 2.5 MG tablet, Take 20 mg (8 tablets) every Wednesday. PROTECT FROM LIGHT AS DIRECTED, Disp: 40 tablet, Rfl: 3   Multiple Vitamin (MULTIVITAMIN) tablet, Take 1 tablet by mouth daily., Disp: , Rfl:    pantoprazole (PROTONIX) 40 MG tablet, Take 1 tablet (40 mg total) by mouth daily. 30 min before food, Disp: 90 tablet, Rfl: 3   sacubitril-valsartan (ENTRESTO) 24-26 MG, Take 1 tablet by mouth 2 (two) times daily., Disp: 180 tablet, Rfl: 3   spironolactone (ALDACTONE) 25 MG tablet, Take 1 tablet (25 mg total) by mouth daily., Disp: 30 tablet, Rfl: 6 Allergies  Allergen Reactions   Crestor [Rosuvastatin Calcium] Other (See Comments)    Elevated muscle enzymes    Lipitor [Atorvastatin Calcium] Other (See Comments)    Elevated muscle enzymes      Social History   Socioeconomic History   Marital status: Married    Spouse name: Gwendolyn   Number of children: 2   Years of education: Not on file   Highest education level: High school graduate  Occupational History   Occupation: Retired    Comment: Semi  Tobacco Use   Smoking status: Former    Packs/day: 0.50    Types: Cigarettes    Start date: 07/06/1971    Quit date: 06/26/2020    Years since quitting: 2.1    Smokeless tobacco: Never   Tobacco comments:    Former smoker 09/24/21  Vaping Use   Vaping Use: Never used  Substance and Sexual Activity   Alcohol use: No    Alcohol/week: 0.0 standard drinks of alcohol   Drug use: No   Sexual activity: Not on file  Other Topics Concern   Not on file  Social History Narrative   Married    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    On child    1 son, 1 daughter   Machine op   Owns guns    Wears seat belt, safe in relationship    Smoker    Retired 06/27/2019   Social Determinants of Health   Financial Resource Strain: [place]  (08/11/2020)   Overall Financial Resource Strain (CARDIA)    Difficulty of Paying Living Expenses: Not hard at all  Food Insecurity: No Food Insecurity (12/11/2021)   Hunger Vital Sign    Worried About Running Out  of Food in the Last Year: Never true    [place] in the Last Year: Never true  Transportation Needs: No Transportation Needs (12/11/2021)   PRAPARE - Hydrologist (Medical): No    Lack of Transportation (Non-Medical): No  Physical Activity: Sufficiently Active (08/11/2020)   Exercise Vital Sign    Days of Exercise per Week: 5 days    Minutes of Exercise per Session: 30 min  Stress: No Stress Concern Present (08/11/2020)   [place]    Feeling of Stress : Not at all  Social Connections: Unknown (08/11/2020)   Social Connection and Isolation Panel [NHANES]    Frequency of Communication with Friends and Family: More than three times a week    Frequency of Social Gatherings with Friends and Family: More than three times a week    Attends Religious Services: Not on file    Active Member of Clubs or Organizations: Yes    Attends Archivist Meetings: Not on file    Marital Status: Married  Intimate Partner Violence: Not At Risk (08/11/2020)   Humiliation, Afraid, Rape, and Kick questionnaire    Fear of Current or Ex-Partner: No     Emotionally Abused: No    Physically Abused: No    Sexually Abused: No    Physical Exam      Future Appointments  Date Time Provider [place]  09/10/2022  7:00 AM [facility]  09/13/2022 10:00 AM [facility] PA/NP [facility] None  10/07/2022  3:00 PM Carollee Leitz, MD [facility]  10/13/2022  1:15 PM Lorenda Peck, DPM [facility]  12/10/2022  7:00 AM [facility] DEVICE REMOTES [facility]  03/11/2023  7:00 AM [facility] DEVICE REMOTES [facility]

## 2022-08-25 NOTE — Telephone Encounter (Signed)
[facility] needs updated Eliquis RX for Mr. Delduca I will forward to triage to have them send one. Mr. Maione is aware and will plan to pick up over the weekend. HE has samples at home to last him in his pill box for now but will need new RX at our next visit. Call complete.   [name], [place] 08/25/2022

## 2022-08-26 ENCOUNTER — Other Ambulatory Visit (HOSPITAL_COMMUNITY): Payer: Self-pay

## 2022-08-26 MED ORDER — APIXABAN 5 MG PO TABS: 5.0000 mg | ORAL_TABLET | Freq: Two times a day (BID) | ORAL | 3 refills | Status: DC

## 2022-09-04 DIAGNOSIS — L0390 Cellulitis, unspecified: Secondary | ICD-10-CM | POA: Diagnosis not present

## 2022-09-04 DIAGNOSIS — M65031 Abscess of tendon sheath, right forearm: Secondary | ICD-10-CM | POA: Diagnosis not present

## 2022-09-04 DIAGNOSIS — L03113 Cellulitis of right upper limb: Secondary | ICD-10-CM | POA: Diagnosis not present

## 2022-09-10 ENCOUNTER — Ambulatory Visit: Payer: PPO

## 2022-09-10 DIAGNOSIS — I472 Ventricular tachycardia, unspecified: Secondary | ICD-10-CM | POA: Diagnosis not present

## 2022-09-10 LAB — CUP PACEART REMOTE DEVICE CHECK
Battery Remaining Longevity: 133 mo
Battery Voltage: 3.05 V
Brady Statistic RV Percent Paced: 1.03 %
Date Time Interrogation Session: 20240308022823
HighPow Impedance: 68 Ohm
Implantable Lead Connection Status: 753985
Implantable Lead Implant Date: 20230608
Implantable Lead Location: 753860
Implantable Pulse Generator Implant Date: 20230608
Lead Channel Impedance Value: 323 Ohm
Lead Channel Impedance Value: 380 Ohm
Lead Channel Pacing Threshold Amplitude: 0.75 V
Lead Channel Pacing Threshold Pulse Width: 0.4 ms
Lead Channel Sensing Intrinsic Amplitude: 8.375 mV
Lead Channel Sensing Intrinsic Amplitude: 8.375 mV
Lead Channel Setting Pacing Amplitude: 2 V
Lead Channel Setting Pacing Pulse Width: 0.4 ms
Lead Channel Setting Sensing Sensitivity: 0.3 mV
Zone Setting Status: 755011
Zone Setting Status: 755011

## 2022-09-10 NOTE — Progress Notes (Signed)
ADVANCED HF CLINIC NOTE   Primary Care: No primary care provider on file. HF Cardiologist: Dr. Haroldine Laws  HPI: 72 y.o. 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    He established with Afib clinic in March for atrial flutter. He underwent successful DCCV 04/17. Despite restoration of SR he noted recurrent palpitations, lightheadedness and chest discomfort at f/u 05/15. 14 day Zio placed. Seen in Cardiology clinic 12/03/21 with palpitations, recurrent near syncope and a syncopal episode. BP meds reduced. Read on 14 day Zio was expedited and showed sustained episodes of VT (longest 1 minute 30 seconds).    Patient was referred to ED for admission d/t recurrent VT on monitor. EP was consulted. Echo showed EF 35-40%, grade II DD, RV mildly reduced, RVSP 27 mmHg. Cardiac MRI: EF 33%, RVEF 40%, with multifocal areas of LGE with high intensity in basal septal LGE, elevated T2 values in lateral wall concerning for myocardial inflammation, concerning for sarcoidosis. LHC showed mild nonobstructive CAD. He was started on amiodarone and underwent ICD placement 12/10/21. Workup including PET for cardiac sarcoid recommended as outpatient.   NM cardiac amyloid tumor LOC: Visual and quantitative assessment (grade 1, H/CL equal 1.28) is equivocally suggestive of transthyretin amyloidosis.   Follow up 7/23 with Dr. Haroldine Laws. NYHA II, volume stable. Low dose spiro started.   Cardiac PET at [facility] (8/23) without evidence of cardiac sarcoidosis or inflammation .   Today he returns for HF follow up with his wife and Nira Conn with paramedicine. Overall feeling fine. He is on MTX 20 mg and pred 2.5 mg daily. Off bactrim. Feels good. Works on [employer] 6 hours/day. Denies CP or SOB. No palpitations. No ICD firings.   Cardiac Studies:  - Cardiac PET at [facility] (8/23) EF 43% without evidence of cardiac sarcoidosis or  inflammation .   - PYP (6/23): Visual and quantitative assessment (grade 1, H/CL equal 1.28) is equivocally suggestive of transthyretin amyloidosis.  - LHC (6/23): mild, non-obs CAD.    - cMRI (6/23): LVEF 33%, RVEF 40%, with multifocal areas of LGE with high intensity in basal septal LGE, elevated T2 values in lateral wall concerning for myocardial inflammation, concerning for sarcoidosis.   - Echo (6/23): EF 35-40%, grade II DD, RV mildly reduced, RVSP 27 mmHg.  - Zio 14 day (6/23): mostly NSR, multiple episodes of wide-complex tachycardia due to VT  Past Medical History:  Diagnosis Date   Allergy    Arthritis    Atrial flutter ([place])    Carpal tunnel syndrome on left    Chest pain of uncertain etiology 99991111   COVID-19    07/07/19   DDD (degenerative disc disease), thoracolumbar    multilevel   Degenerative joint disease of left shoulder    02/2011    Diverticulosis    left colon (2008)    ED (erectile dysfunction)    02/2011    GERD (gastroesophageal reflux disease)    H. pylori infection    Hyperlipidemia    Hypertension    Insomnia    Lactose intolerance    Low back pain    PVC (premature ventricular contraction) 10/24/2019   Shingles    07/2018   Smoking    smoking since age 33 y.o   Toe fracture, left    4th in 2014   Vitamin D deficiency    Current Outpatient Medications  Medication Sig Dispense Refill  amiodarone (PACERONE) 200 MG tablet Take 1 tablet (200 mg total) by mouth daily. 30 tablet 5   apixaban (ELIQUIS) 5 MG TABS tablet Take 1 tablet (5 mg total) by mouth 2 (two) times daily. 60 tablet 3   calcium carbonate (OS-CAL - DOSED IN MG OF ELEMENTAL CALCIUM) 1250 (500 Ca) MG tablet Take 1 tablet (500 mg of elemental calcium total) by mouth daily with breakfast. 30 tablet 11   carvedilol (COREG) 6.25 MG tablet Take 1 tablet (6.25 mg total) by mouth 2 (two) times daily with a meal. 60 tablet 6   Cholecalciferol (VITAMIN D-3) 125 MCG (5000 UT) TABS Take 1  tablet by mouth daily. 30 tablet 11   ezetimibe (ZETIA) 10 MG tablet Take 1 tablet (10 mg total) by mouth daily. 90 tablet 3   FARXIGA 10 MG TABS tablet Take 1 tablet (10 mg total) by mouth daily. 90 tablet 3   ferrous sulfate 325 (65 FE) MG tablet Take 325 mg by mouth daily with breakfast.     folic acid (FOLVITE) 1 MG tablet Take 1 tablet (1 mg total) by mouth daily. 30 tablet 11   gabapentin (NEURONTIN) 300 MG capsule Take 300 mg by mouth daily.     methotrexate (RHEUMATREX) 2.5 MG tablet Take 20 mg (8 tablets) every Wednesday. PROTECT FROM LIGHT AS DIRECTED 40 tablet 3   Multiple Vitamin (MULTIVITAMIN) tablet Take 1 tablet by mouth daily.     pantoprazole (PROTONIX) 40 MG tablet Take 1 tablet (40 mg total) by mouth daily. 30 min before food 90 tablet 3   sacubitril-valsartan (ENTRESTO) 24-26 MG Take 1 tablet by mouth 2 (two) times daily. 180 tablet 3   spironolactone (ALDACTONE) 25 MG tablet Take 1 tablet (25 mg total) by mouth daily. 30 tablet 6   No current facility-administered medications for this visit.   Allergies  Allergen Reactions   Crestor [Rosuvastatin Calcium] Other (See Comments)    Elevated muscle enzymes    Lipitor [Atorvastatin Calcium] Other (See Comments)    Elevated muscle enzymes    Social History   Socioeconomic History   Marital status: Married    Spouse name: Gwendolyn   Number of children: 2   Years of education: Not on file   Highest education level: High school graduate  Occupational History   Occupation: Retired    Comment: Semi  Tobacco Use   Smoking status: Former    Packs/day: 0.50    Types: Cigarettes    Start date: 07/06/1971    Quit date: 06/26/2020    Years since quitting: 2.2   Smokeless tobacco: Never   Tobacco comments:    Former smoker 09/24/21  Vaping Use   Vaping Use: Never used  Substance and Sexual Activity   Alcohol use: No    Alcohol/week: 0.0 standard drinks of alcohol   Drug use: No   Sexual activity: Not on file   Other Topics Concern   Not on file  Social History Narrative   Married    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    On child    1 son, 1 daughter   Machine op   Owns guns    Wears seat belt, safe in relationship    Smoker    Retired 06/27/2019   Social Determinants of Health   Financial Resource Strain: [place]  (08/11/2020)   Overall Financial Resource Strain ([place])    Difficulty of Paying Living Expenses: Not hard at all  [place]: No Food Insecurity (12/11/2021)  Hunger Vital Sign    Worried About Running Out of Food in the Last Year: Never true    Ran Out of Food in the Last Year: Never true  Transportation Needs: No Transportation Needs (12/11/2021)   PRAPARE - Hydrologist (Medical): No    Lack of Transportation (Non-Medical): No  Physical Activity: Sufficiently Active (08/11/2020)   Exercise Vital Sign    Days of Exercise per Week: 5 days    Minutes of Exercise per Session: 30 min  Stress: No Stress Concern Present (08/11/2020)   [place]    Feeling of Stress : Not at all  Social Connections: Unknown (08/11/2020)   Social Connection and Isolation Panel [NHANES]    Frequency of Communication with Friends and Family: More than three times a week    Frequency of Social Gatherings with Friends and Family: More than three times a week    Attends Religious Services: Not on file    Active Member of Clubs or Organizations: Yes    Attends Archivist Meetings: Not on file    Marital Status: Married  Intimate Partner Violence: Not At Risk (08/11/2020)   Humiliation, Afraid, Rape, and Kick questionnaire    Fear of Current or Ex-Partner: No    Emotionally Abused: No    Physically Abused: No    Sexually Abused: No   Family History  Problem Relation Age of Onset   Heart disease Mother        age 59    Throat cancer Father 61   Cancer Father        throat    Cancer Maternal Uncle         uncle ?maternal or paternal died colon cancer in his 70s    Diabetes Maternal Aunt    Heart attack Maternal Grandmother   - His mother and grandmother had heart issues, uncertain if they had CHF.  There were no vitals taken for this visit.  Wt Readings from Last 3 Encounters:  08/25/22 107.4 kg (236 lb 12.8 oz)  08/11/22 107.5 kg (237 lb)  08/04/22 107.5 kg (237 lb)   PHYSICAL EXAM: General:  Well appearing. No resp difficulty HEENT: normal Neck: supple. no JVD. Carotids 2+ bilat; no bruits. No lymphadenopathy or thryomegaly appreciated. Cor: PMI nondisplaced. Regular rate & rhythm. No rubs, gallops or murmurs. Lungs: clear Abdomen: soft, nontender, nondistended. No hepatosplenomegaly. No bruits or masses. Good bowel sounds. Extremities: no cyanosis, clubbing, rash, edema Neuro: alert & orientedx3, cranial nerves grossly intact. moves all 4 extremities w/o difficulty. Affect pleasant   MDT ICD interrogation (personally reviewed): No VT/AF. Activity 2-3 hrs/day. Optivol ok Personally reviewed   ASSESSMENT & PLAN: Chronic systolic CHF/NICM: - Echo (6/23): EF 35-40%, grade II DD, RV mildly reduced, RVSP 27 mmHg. - Cardiac MRI (6/23): EF 33%, RVEF 40%, with multifocal areas of LGE with high intensity in basal septal LGE, elevated T2 values in lateral wall concerning for myocardial inflammation. Findings concerning for sarcoidosis.  - LHC (6/23): mild nonobstructive CAD. - Suspect etiology cardiac sarcoidosis (most likely) vs hereditary LMNA cardiomyopathy. - Cardiac PET at [facility] (8/23) showed no evidence of cardiac sarcoidosis. EF 43% - Doing well. NYHA II. Volume looks good on exam, and on OptiVol. - Continue spironolactone to 25 mg daily.  - Continue Coreg 6.25 mg bid. - Continue Entresto 24/26 mg bid. - Continue Farxiga 10 mg daily. - Continue Paramedicine support. - SBP 100-110  so will not titrate - Labs today  - Will ned repeat PET after off steroids for at least 1 month    - [facility]uppressive Therapy for Cardiac Sarcoid / Advanced Heart Failure at [facility]    Prednisone  Weeks 1-4: 30 mg daily  Weeks 5-8 : 25 mg daily  Weeks 9-12: 20 mg daily  Weeks 13-16: 15 mg daily Weeks 17-18: 10 mg daily; d/c Bactrim Weeks 19-20: 7.5 mg daily  Weeks 21-22: 5 mg daily Weeks 23-24: 2.5 mg daily  Weeks 25: stop prednisone    Methotrexate Weeks 1&2: 10 mg (four 2.5 mg tablets) once weekly Weeks 3&4: 12.5 mg (five 2.5 mg tablets) once weekly Weeks 5&6: 15 mg (six 2.5 mg tablets) once weekly  Weeks 7&8: 17.5 mg (seven 2.5 mg tablets) once weekly Weeks 9+: 20 mg (eight 2.5 mg tablets ) once weekly    Additional Medications  Bactrim DS : 1 tablet Mon-Wed- Fri while taking >15 mg of prednisone daily - now of Folic Acid : 1 mg daily  Calcium: 1200-1500 mg daily  Vitamin D: [phone] IU total daily  Omeprazole: 20 mg daily while taking any dose of prednisone.    Monitoring    CBC with diff, AST, ALT, creatinine, and fasting blood glucose             Month 1             Month 2             Month 3             Every 3 months thereafter   2. VT/Hx syncope: - Sustained VT on Zio monitor - Followed by EP. - Continue amiodarone 200 mg daily. - No driving X 6 months (until 12/23). - ICD interrogated personally as above  - TSH (6/23) OK.   3. Atrial flutter: - Regular on exam today. - CHA2DS2-VASc of at least 3.   - On amiodarone as above.  - Continue Eliquis 5 bid. No bleeding   4. CAD: - Mild on LHC 06/23. - No s/s angina - No aspirin with anticoagulation.  - Continue Zetia. Has history of elevated LFTs with statins.  5. HTN: - SBP 100-110 at home  Rafael Bihari, FNP  2:20 PM

## 2022-09-13 ENCOUNTER — Other Ambulatory Visit (HOSPITAL_COMMUNITY): Payer: Self-pay

## 2022-09-13 ENCOUNTER — Encounter (HOSPITAL_COMMUNITY): Payer: Self-pay

## 2022-09-13 ENCOUNTER — Ambulatory Visit (HOSPITAL_COMMUNITY)
Admission: RE | Admit: 2022-09-13 | Discharge: 2022-09-13 | Disposition: A | Discharge status: Home / Self Care | Payer: PPO | Source: Ambulatory Visit | Attending: Family Medicine | Admitting: Family Medicine

## 2022-09-13 VITALS — BP 100/70 | HR 55 | Wt 246.2 lb

## 2022-09-13 DIAGNOSIS — D8685 Sarcoid myocarditis: Secondary | ICD-10-CM | POA: Diagnosis not present

## 2022-09-13 DIAGNOSIS — I5022 Chronic systolic (congestive) heart failure: Secondary | ICD-10-CM | POA: Insufficient documentation

## 2022-09-13 DIAGNOSIS — Z7984 Long term (current) use of oral hypoglycemic drugs: Secondary | ICD-10-CM | POA: Insufficient documentation

## 2022-09-13 DIAGNOSIS — Z87891 Personal history of nicotine dependence: Secondary | ICD-10-CM | POA: Insufficient documentation

## 2022-09-13 DIAGNOSIS — I472 Ventricular tachycardia, unspecified: Secondary | ICD-10-CM

## 2022-09-13 DIAGNOSIS — I110 Hypertensive heart disease with heart failure: Secondary | ICD-10-CM | POA: Insufficient documentation

## 2022-09-13 DIAGNOSIS — I10 Essential (primary) hypertension: Secondary | ICD-10-CM

## 2022-09-13 DIAGNOSIS — Z7901 Long term (current) use of anticoagulants: Secondary | ICD-10-CM | POA: Diagnosis not present

## 2022-09-13 DIAGNOSIS — I428 Other cardiomyopathies: Secondary | ICD-10-CM | POA: Diagnosis not present

## 2022-09-13 DIAGNOSIS — I4892 Unspecified atrial flutter: Secondary | ICD-10-CM | POA: Diagnosis not present

## 2022-09-13 DIAGNOSIS — Z79899 Other long term (current) drug therapy: Secondary | ICD-10-CM | POA: Diagnosis not present

## 2022-09-13 DIAGNOSIS — I2510 Atherosclerotic heart disease of native coronary artery without angina pectoris: Secondary | ICD-10-CM | POA: Insufficient documentation

## 2022-09-13 LAB — CBC WITH DIFFERENTIAL/PLATELET
Abs Immature Granulocytes: 0.02 10*3/uL (ref 0.00–0.07)
Basophils Absolute: 0 10*3/uL (ref 0.0–0.1)
Basophils Relative: 0 %
Eosinophils Absolute: 0.1 10*3/uL (ref 0.0–0.5)
Eosinophils Relative: 2 %
HCT: 38.8 % — ABNORMAL LOW (ref 39.0–52.0)
Hemoglobin: 12.3 g/dL — ABNORMAL LOW (ref 13.0–17.0)
Immature Granulocytes: 0 %
Lymphocytes Relative: 24 %
Lymphs Abs: 1.3 10*3/uL (ref 0.7–4.0)
MCH: 32.1 pg (ref 26.0–34.0)
MCHC: 31.7 g/dL (ref 30.0–36.0)
MCV: 101.3 fL — ABNORMAL HIGH (ref 80.0–100.0)
Monocytes Absolute: 0.5 10*3/uL (ref 0.1–1.0)
Monocytes Relative: 9 %
Neutro Abs: 3.5 10*3/uL (ref 1.7–7.7)
Neutrophils Relative %: 65 %
Platelets: 288 10*3/uL (ref 150–400)
RBC: 3.83 MIL/uL — ABNORMAL LOW (ref 4.22–5.81)
RDW: 13.8 % (ref 11.5–15.5)
WBC: 5.4 10*3/uL (ref 4.0–10.5)
nRBC: 0 % (ref 0.0–0.2)

## 2022-09-13 LAB — COMPREHENSIVE METABOLIC PANEL
ALT: 24 U/L (ref 0–44)
AST: 24 U/L (ref 15–41)
Albumin: 3.6 g/dL (ref 3.5–5.0)
Alkaline Phosphatase: 88 U/L (ref 38–126)
Anion gap: 10 (ref 5–15)
BUN: 15 mg/dL (ref 8–23)
CO2: 21 mmol/L — ABNORMAL LOW (ref 22–32)
Calcium: 9.3 mg/dL (ref 8.9–10.3)
Chloride: 107 mmol/L (ref 98–111)
Creatinine, Ser: 1.17 mg/dL (ref 0.61–1.24)
GFR, Estimated: >60 mL/min (ref >60)
Glucose, Bld: 86 mg/dL (ref 70–99)
Potassium: 4.3 mmol/L (ref 3.5–5.1)
Sodium: 138 mmol/L (ref 135–145)
Total Bilirubin: 0.6 mg/dL (ref 0.3–1.2)
Total Protein: 7.1 g/dL (ref 6.5–8.1)

## 2022-09-13 LAB — TSH: TSH: 0.762 u[IU]/mL (ref 0.350–4.500)

## 2022-09-13 NOTE — Patient Instructions (Addendum)
EKG done today.  Labs done today. We will contact you only if your labs are abnormal.  No medication changes were made. Please continue all current medications as prescribed.  Your provider has recommended you have a Cardiac PET Scan at [facility]. We will get this approved with your insurance company and get it scheduled for you. We will call you with the date and time and instructions. [facility] will call you to review this information the day before the test.  Your physician recommends that you schedule a follow-up appointment in: 3 months with Dr. Haroldine Laws. Please contact our office in May to schedule a June appointment.   If you have any questions or concerns before your next appointment please send [place] a message through [place] or call our office at [phone].    TO LEAVE A MESSAGE FOR THE NURSE SELECT OPTION 2, PLEASE LEAVE A MESSAGE INCLUDING: YOUR NAME DATE OF BIRTH CALL BACK NUMBER REASON FOR CALL**this is important as we prioritize the call backs  YOU WILL RECEIVE A CALL BACK THE SAME DAY AS LONG AS YOU CALL BEFORE 4:00 PM   Do the following things EVERYDAY: Weigh yourself in the morning before breakfast. Write it down and keep it in a log. Take your medicines as prescribed Eat low salt foods--Limit salt (sodium) to 2000 mg per day.  Stay as active as you can everyday Limit all fluids for the day to less than 2 liters   At the [facility], you and your health needs are our priority. As part of our continuing mission to provide you with exceptional heart care, we have created designated Provider Care Teams. These Care Teams include your primary Cardiologist (physician) and Advanced Practice Providers (APPs- Physician Assistants and Nurse Practitioners) who all work together to provide you with the care you need, when you need it.   You may see any of the following providers on your designated Care Team at your next follow up: Dr Glori Bickers Dr Haynes Kerns, NP Lyda Jester, [place] Audry Riles, PharmD   Please be sure to bring in all your medications bottles to every appointment.

## 2022-09-13 NOTE — Progress Notes (Signed)
Paramedicine Encounter    Patient ID: John Keith, male    DOB: [birth year], 73 y.o.   MRN: [id number]   Met with Ralpheal in clinic and he reports to be feeling okay today but admitted he had a dizzy spell lasting about a minuets this morning prior to taking his medications. He denied any chest pain, diaphoresis or nausea. He denied any swelling or weight gain. He says his weight at home today was 237lbs (unclothed).   I reviewed meds and confirmed same with CMA. Vitals, EKG and Labs were obtained. EKG normal- device showed no arrhythmias or fluid overload.   Allena Katz NP reviewed chat and assessed patient deciding to not change any meds today. She ordered a repeat cardiac PET Scan at [facility] and a follow up with Dr. Haroldine Laws in 3 months.   I filled pill box for one week and 2 days- our next home visit will be next Wednesday- he agreed with plan.    Clinic visit complete.  [name], [place] 09/13/2022      Patient Care Team: [name], MD as PCP - Cardiology (Cardiology) [name], MD as PCP - Electrophysiology (Cardiology) [name], MD as Consulting Physician (Internal Medicine)  Patient Active Problem List   Diagnosis Date Noted   Chronic systolic heart failure ([place]) 12/15/2021   Chronic combined systolic and diastolic CHF (congestive heart failure) ([place]) 12/15/2021   Nasal sore 12/15/2021   Syncope 12/07/2021   Ventricular tachycardia ([place]) 12/07/2021   Typical atrial flutter ([place]) 09/24/2021   Secondary hypercoagulable state ([place]) 09/24/2021   Abnormal x-ray of neck 06/16/2021   Abnormal MRI, lumbar spine 10/23/2020   Chronic low back pain 10/23/2020   Neuropathy 10/23/2020   Coronary artery disease of native artery of native heart with stable angina pectoris ([place]) 10/23/2020   Impingement syndrome of left shoulder region 08/22/2020   Pain in joint of left shoulder 07/24/2020   Chronic left shoulder pain 07/11/2020    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   Anemia 12/13/2019   Chest pain of uncertain etiology 99991111   PVC (premature ventricular contraction) 10/24/2019   Gastric ulcer without hemorrhage or perforation 09/28/2019   COVID-19 virus detected 07/12/2019   Elevated troponin 07/07/2019   Elevated serum protein level 07/07/2019   Postherpetic neuralgia 08/17/2018   Benign prostatic hyperplasia 08/01/2018   Hip pain 06/29/2018   Aortic atherosclerosis ([place]) 09/22/2016   Fatigue 06/03/2016   Lower urinary tract symptoms (LUTS) 06/03/2016   Constipation 03/10/2016   Lumbar radiculopathy 02/20/2016   Encounter for immunization 11/26/2015   Insomnia 06/10/2015   External hemorrhoid 03/07/2015   Health care maintenance 01/09/2014   Carpal tunnel syndrome of left wrist 01/03/2013   Hyperlipidemia 11/09/2012   Vitamin D deficiency 11/09/2012   Essential hypertension 11/06/2012   Tobacco abuse 11/06/2012   Seasonal allergies 11/06/2012   Erectile dysfunction 11/06/2012   GERD (gastroesophageal reflux disease) 11/06/2012   DDD (degenerative disc disease), thoracolumbar 11/06/2012   Lactose intolerance 11/06/2012   Diverticulosis of colon without hemorrhage 11/06/2012    Current Outpatient Medications:    amiodarone (PACERONE) 200 MG tablet, Take 1 tablet (200 mg total) by mouth daily., Disp: 30 tablet, Rfl: 5   apixaban (ELIQUIS) 5 MG TABS tablet, Take 1 tablet (5 mg total) by mouth 2 (two) times daily., Disp: 60 tablet, Rfl: 3   calcium carbonate (OS-CAL - DOSED IN MG OF ELEMENTAL CALCIUM) 1250 (500 Ca) MG tablet, Take 1 tablet (500 mg of elemental calcium total)  by mouth daily with breakfast., Disp: 30 tablet, Rfl: 11   carvedilol (COREG) 6.25 MG tablet, Take 1 tablet (6.25 mg total) by mouth 2 (two) times daily with a meal., Disp: 60 tablet, Rfl: 6   Cholecalciferol (VITAMIN D-3) 125 MCG (5000 UT) TABS, Take 1 tablet by mouth daily., Disp: 30 tablet, Rfl: 11   ezetimibe (ZETIA) 10 MG  tablet, Take 1 tablet (10 mg total) by mouth daily., Disp: 90 tablet, Rfl: 3   FARXIGA 10 MG TABS tablet, Take 1 tablet (10 mg total) by mouth daily., Disp: 90 tablet, Rfl: 3   ferrous sulfate 325 (65 FE) MG tablet, Take 325 mg by mouth daily with breakfast., Disp: , Rfl:    folic acid (FOLVITE) 1 MG tablet, Take 1 tablet (1 mg total) by mouth daily., Disp: 30 tablet, Rfl: 11   gabapentin (NEURONTIN) 300 MG capsule, Take 300 mg by mouth daily., Disp: , Rfl:    methotrexate (RHEUMATREX) 2.5 MG tablet, Take 20 mg (8 tablets) every Wednesday. PROTECT FROM LIGHT AS DIRECTED, Disp: 40 tablet, Rfl: 3   Multiple Vitamin (MULTIVITAMIN) tablet, Take 1 tablet by mouth daily., Disp: , Rfl:    mupirocin ointment (BACTROBAN) 2 %, , Disp: , Rfl:    pantoprazole (PROTONIX) 40 MG tablet, Take 1 tablet (40 mg total) by mouth daily. 30 min before food, Disp: 90 tablet, Rfl: 3   sacubitril-valsartan (ENTRESTO) 24-26 MG, Take 1 tablet by mouth 2 (two) times daily., Disp: 180 tablet, Rfl: 3   spironolactone (ALDACTONE) 25 MG tablet, Take 1 tablet (25 mg total) by mouth daily., Disp: 30 tablet, Rfl: 6 Allergies  Allergen Reactions   Crestor [Rosuvastatin Calcium] Other (See Comments)    Elevated muscle enzymes    Lipitor [Atorvastatin Calcium] Other (See Comments)    Elevated muscle enzymes      Social History   Socioeconomic History   Marital status: Married    Spouse name: Gwendolyn   Number of children: 2   Years of education: Not on file   Highest education level: High school graduate  Occupational History   Occupation: Retired    Comment: Semi  Tobacco Use   Smoking status: Former    Packs/day: 0.50    Types: Cigarettes    Start date: 07/06/1971    Quit date: 06/26/2020    Years since quitting: 2.2   Smokeless tobacco: Never   Tobacco comments:    Former smoker 09/24/21  Vaping Use   Vaping Use: Never used  Substance and Sexual Activity   Alcohol use: No    Alcohol/week: 0.0 standard  drinks of alcohol   Drug use: No   Sexual activity: Not on file  Other Topics Concern   Not on file  Social History Narrative   Married    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    On child    1 son, 1 daughter   Social worker op   Owns guns    Wears seat belt, safe in relationship    Smoker    Retired 06/27/2019   Social Determinants of Health   Financial Resource Strain: [place]  (08/11/2020)   Overall Financial Resource Strain ([place])    Difficulty of Paying Living Expenses: Not hard at all  Food Insecurity: No [place] (12/11/2021)   Hunger Vital Sign    Worried About Running Out of Food in the Last Year: Never true    [place] in the Last Year: Never true  Transportation Needs: No Transportation  Needs (12/11/2021)   PRAPARE - Hydrologist (Medical): No    Lack of Transportation (Non-Medical): No  Physical Activity: Sufficiently Active (08/11/2020)   Exercise Vital Sign    Days of Exercise per Week: 5 days    Minutes of Exercise per Session: 30 min  Stress: No Stress Concern Present (08/11/2020)   [place]    Feeling of Stress : Not at all  Social Connections: Unknown (08/11/2020)   Social Connection and Isolation Panel [NHANES]    Frequency of Communication with Friends and Family: More than three times a week    Frequency of Social Gatherings with Friends and Family: More than three times a week    Attends Religious Services: Not on file    Active Member of Clubs or Organizations: Yes    Attends Archivist Meetings: Not on file    Marital Status: Married  Intimate Partner Violence: Not At Risk (08/11/2020)   Humiliation, Afraid, Rape, and Kick questionnaire    Fear of Current or Ex-Partner: No    Emotionally Abused: No    Physically Abused: No    Sexually Abused: No    Physical Exam      Future Appointments  Date Time Provider [place]  10/07/2022  3:00 PM Carollee Leitz, MD [facility]  10/13/2022  1:15 PM Lorenda Peck, DPM [facility]  12/10/2022  7:00 AM [facility] DEVICE REMOTES [facility]  03/11/2023  7:00 AM [facility] DEVICE REMOTES [facility]     ACTION: Home visit completed

## 2022-09-22 ENCOUNTER — Other Ambulatory Visit (HOSPITAL_COMMUNITY): Payer: Self-pay

## 2022-09-22 NOTE — Progress Notes (Unsigned)
Paramedicine Encounter    Patient ID: John Keith, male    DOB: 12-Nov-1950, 72 y.o.   MRN: [id number]   Patient Care Team: [name], MD as PCP - Cardiology (Cardiology) [name], MD as PCP - Electrophysiology (Cardiology) [name], MD as Consulting Physician (Internal Medicine)  Patient Active Problem List   Diagnosis Date Noted   Chronic systolic heart failure ([place]) 12/15/2021   Chronic combined systolic and diastolic CHF (congestive heart failure) ([place]) 12/15/2021   Nasal sore 12/15/2021   Syncope 12/07/2021   Ventricular tachycardia ([place]) 12/07/2021   Typical atrial flutter ([place]) 09/24/2021   Secondary hypercoagulable state ([place]) 09/24/2021   Abnormal x-ray of neck 06/16/2021   Abnormal MRI, lumbar spine 10/23/2020   Chronic low back pain 10/23/2020   Neuropathy 10/23/2020   Coronary artery disease of native artery of native heart with stable angina pectoris ([place]) 10/23/2020   Impingement syndrome of left shoulder region 08/22/2020   Pain in joint of left shoulder 07/24/2020   Chronic left shoulder pain 07/11/2020   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   Anemia 12/13/2019   Chest pain of uncertain etiology 99991111   PVC (premature ventricular contraction) 10/24/2019   Gastric ulcer without hemorrhage or perforation 09/28/2019   COVID-19 virus detected 07/12/2019   Elevated troponin 07/07/2019   Elevated serum protein level 07/07/2019   Postherpetic neuralgia 08/17/2018   Benign prostatic hyperplasia 08/01/2018   Hip pain 06/29/2018   Aortic atherosclerosis ([place]) 09/22/2016   Fatigue 06/03/2016   Lower urinary tract symptoms (LUTS) 06/03/2016   Constipation 03/10/2016   Lumbar radiculopathy 02/20/2016   Encounter for immunization 11/26/2015   Insomnia 06/10/2015   External hemorrhoid 03/07/2015   Health care maintenance 01/09/2014   Carpal tunnel syndrome of left wrist 01/03/2013   Hyperlipidemia 11/09/2012   Vitamin D deficiency  11/09/2012   Essential hypertension 11/06/2012   Tobacco abuse 11/06/2012   Seasonal allergies 11/06/2012   Erectile dysfunction 11/06/2012   GERD (gastroesophageal reflux disease) 11/06/2012   DDD (degenerative disc disease), thoracolumbar 11/06/2012   Lactose intolerance 11/06/2012   Diverticulosis of colon without hemorrhage 11/06/2012    Current Outpatient Medications:    amiodarone (PACERONE) 200 MG tablet, Take 1 tablet (200 mg total) by mouth daily., Disp: 30 tablet, Rfl: 5   apixaban (ELIQUIS) 5 MG TABS tablet, Take 1 tablet (5 mg total) by mouth 2 (two) times daily., Disp: 60 tablet, Rfl: 3   calcium carbonate (OS-CAL - DOSED IN MG OF ELEMENTAL CALCIUM) 1250 (500 Ca) MG tablet, Take 1 tablet (500 mg of elemental calcium total) by mouth daily with breakfast., Disp: 30 tablet, Rfl: 11   carvedilol (COREG) 6.25 MG tablet, Take 1 tablet (6.25 mg total) by mouth 2 (two) times daily with a meal., Disp: 60 tablet, Rfl: 6   Cholecalciferol (VITAMIN D-3) 125 MCG (5000 UT) TABS, Take 1 tablet by mouth daily., Disp: 30 tablet, Rfl: 11   ezetimibe (ZETIA) 10 MG tablet, Take 1 tablet (10 mg total) by mouth daily., Disp: 90 tablet, Rfl: 3   FARXIGA 10 MG TABS tablet, Take 1 tablet (10 mg total) by mouth daily., Disp: 90 tablet, Rfl: 3   ferrous sulfate 325 (65 FE) MG tablet, Take 325 mg by mouth daily with breakfast., Disp: , Rfl:    folic acid (FOLVITE) 1 MG tablet, Take 1 tablet (1 mg total) by mouth daily., Disp: 30 tablet, Rfl: 11   gabapentin (NEURONTIN) 300 MG capsule, Take 300 mg by mouth daily., Disp: ,  Rfl:    methotrexate (RHEUMATREX) 2.5 MG tablet, Take 20 mg (8 tablets) every Wednesday. PROTECT FROM LIGHT AS DIRECTED, Disp: 40 tablet, Rfl: 3   Multiple Vitamin (MULTIVITAMIN) tablet, Take 1 tablet by mouth daily., Disp: , Rfl:    mupirocin ointment (BACTROBAN) 2 %, , Disp: , Rfl:    pantoprazole (PROTONIX) 40 MG tablet, Take 1 tablet (40 mg total) by mouth daily. 30 min before food,  Disp: 90 tablet, Rfl: 3   sacubitril-valsartan (ENTRESTO) 24-26 MG, Take 1 tablet by mouth 2 (two) times daily., Disp: 180 tablet, Rfl: 3   spironolactone (ALDACTONE) 25 MG tablet, Take 1 tablet (25 mg total) by mouth daily., Disp: 30 tablet, Rfl: 6 Allergies  Allergen Reactions   Crestor [Rosuvastatin Calcium] Other (See Comments)    Elevated muscle enzymes    Lipitor [Atorvastatin Calcium] Other (See Comments)    Elevated muscle enzymes      Social History   Socioeconomic History   Marital status: Married    Spouse name: Gwendolyn   Number of children: 2   Years of education: Not on file   Highest education level: High school graduate  Occupational History   Occupation: Retired    Comment: Semi  Tobacco Use   Smoking status: Former    Packs/day: .5    Types: Cigarettes    Start date: 07/06/1971    Quit date: 06/26/2020    Years since quitting: 2.2   Smokeless tobacco: Never   Tobacco comments:    Former smoker 09/24/21  Vaping Use   Vaping Use: Never used  Substance and Sexual Activity   Alcohol use: No    Alcohol/week: 0.0 standard drinks of alcohol   Drug use: No   Sexual activity: Not on file  Other Topics Concern   Not on file  Social History Narrative   Married    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    On child    1 son, 1 daughter   Social worker op   Owns guns    Wears seat belt, safe in relationship    Smoker    Retired 06/27/2019   Social Determinants of Health   Financial Resource Strain: [place]  (08/11/2020)   Overall Financial Resource Strain (CARDIA)    Difficulty of Paying Living Expenses: Not hard at all  Food Insecurity: No [place] (12/11/2021)   Hunger Vital Sign    Worried About Running Out of Food in the Last Year: Never true    [place] in the Last Year: Never true  Transportation Needs: No Transportation Needs (12/11/2021)   PRAPARE - Hydrologist (Medical): No    Lack of Transportation (Non-Medical): No  Physical  Activity: Sufficiently Active (08/11/2020)   Exercise Vital Sign    Days of Exercise per Week: 5 days    Minutes of Exercise per Session: 30 min  Stress: No Stress Concern Present (08/11/2020)   [place]    Feeling of Stress : Not at all  Social Connections: Unknown (08/11/2020)   Social Connection and Isolation Panel [NHANES]    Frequency of Communication with Friends and Family: More than three times a week    Frequency of Social Gatherings with Friends and Family: More than three times a week    Attends Religious Services: Not on file    Active Member of Clubs or Organizations: Yes    Attends Archivist Meetings: Not on file  Marital Status: Married  Human resources officer Violence: Not At Risk (08/11/2020)   Humiliation, Afraid, Rape, and Kick questionnaire    Fear of Current or Ex-Partner: No    Emotionally Abused: No    Physically Abused: No    Sexually Abused: No    Physical Exam      Future Appointments  Date Time Provider [place]  10/07/2022  3:00 PM Carollee Leitz, MD [facility]  10/13/2022  1:15 PM Lorenda Peck, DPM [facility]  12/10/2022  7:00 AM [facility] DEVICE REMOTES [facility]  03/11/2023  7:00 AM [facility] DEVICE REMOTES [facility]     ACTION: {Paramed Action:[phone]}

## 2022-09-30 ENCOUNTER — Encounter: Payer: Self-pay | Admitting: Internal Medicine

## 2022-09-30 DIAGNOSIS — R040 Epistaxis: Secondary | ICD-10-CM | POA: Diagnosis not present

## 2022-10-06 ENCOUNTER — Telehealth (HOSPITAL_COMMUNITY): Payer: Self-pay

## 2022-10-06 ENCOUNTER — Other Ambulatory Visit (HOSPITAL_COMMUNITY): Payer: Self-pay

## 2022-10-06 ENCOUNTER — Telehealth: Payer: Self-pay | Admitting: *Deleted

## 2022-10-06 ENCOUNTER — Other Ambulatory Visit (HOSPITAL_COMMUNITY): Payer: Self-pay | Admitting: Internal Medicine

## 2022-10-06 NOTE — Telephone Encounter (Signed)
Mr. Hallak reports he has not heard from [place] for scheduling his follow up PET Scan, I advised him I personally am not sure who to reach out to there but I will forward to triage team at [facility] clinic to see if they can assist in inquiring about same.   [name], [place] 10/06/2022

## 2022-10-06 NOTE — Progress Notes (Signed)
Remote ICD transmission.   

## 2022-10-06 NOTE — Progress Notes (Signed)
Paramedicine Encounter    Patient ID: John Keith, male    DOB: [birth year], 72 y.o.   MRN: [id number]   Complaints- NONE   Assessment- CAOX4, warm and dry, no lower leg swelling, lungs clear, no chest pain, dizziness or weight gain.   Compliance with meds- no missed doses over the last week.   Pill box filled- for one week   Refills needed- amiodarone, methotrexate, eliquis, spiro, carvedilol  Meds changes since last visit- none     Social changes- none    BP 102/60   Pulse 66   Resp 16   Wt 234 lb 6.4 oz (106.3 kg)   SpO2 93%   BMI 25.12 kg/m  Weight yesterday-- 233lbs  Last visit weight-234lbs  Arrived for home visit for John Keith who reports to be feeling well with no complaints. He states he has had no chest pain, dizziness or shortness of breath over the last two weeks. He denied any swelling or increased weight gain. He has been compliant with all meds. I reviewed same and filled pill box for one week. I obtained vitals and assessment as noted. Lungs clear, no swelling noted. Vitals within normal limits. We reviewed upcoming appointments. He reports he has not heard from [place] yet in rearguards to a follow up PET Scan- I will reach out to HF RN to see if they can assist. HF education provided.  He agreed with plan. He denied any other needs at present. Home visit complete. I will see John Keith in one week.    [name], [place]  ACTION: Home visit completed    Patient Care Team: [name], MD as PCP - Cardiology (Cardiology) [name], MD as PCP - Electrophysiology (Cardiology) [name], MD as Consulting Physician (Internal Medicine)  Patient Active Problem List   Diagnosis Date Noted   Chronic systolic heart failure 99991111   Chronic combined systolic and diastolic CHF (congestive heart failure) 12/15/2021   Nasal sore 12/15/2021   Syncope 12/07/2021   Ventricular tachycardia 12/07/2021   Typical atrial flutter  09/24/2021   Secondary hypercoagulable state 09/24/2021   Abnormal x-ray of neck 06/16/2021   Abnormal MRI, lumbar spine 10/23/2020   Chronic low back pain 10/23/2020   Neuropathy 10/23/2020   Coronary artery disease of native artery of native heart with stable angina pectoris 10/23/2020   Impingement syndrome of left shoulder region 08/22/2020   Pain in joint of left shoulder 07/24/2020   Chronic left shoulder pain 07/11/2020   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   Anemia 12/13/2019   Chest pain of uncertain etiology 99991111   PVC (premature ventricular contraction) 10/24/2019   Gastric ulcer without hemorrhage or perforation 09/28/2019   COVID-19 virus detected 07/12/2019   Elevated troponin 07/07/2019   Elevated serum protein level 07/07/2019   Postherpetic neuralgia 08/17/2018   Benign prostatic hyperplasia 08/01/2018   Hip pain 06/29/2018   Aortic atherosclerosis 09/22/2016   Fatigue 06/03/2016   Lower urinary tract symptoms (LUTS) 06/03/2016   Constipation 03/10/2016   Lumbar radiculopathy 02/20/2016   Encounter for immunization 11/26/2015   Insomnia 06/10/2015   External hemorrhoid 03/07/2015   Health care maintenance 01/09/2014   Carpal tunnel syndrome of left wrist 01/03/2013   Hyperlipidemia 11/09/2012   Vitamin D deficiency 11/09/2012   Essential hypertension 11/06/2012   Tobacco abuse 11/06/2012   Seasonal allergies 11/06/2012   Erectile dysfunction 11/06/2012   GERD (gastroesophageal reflux disease) 11/06/2012   DDD (degenerative disc disease), thoracolumbar 11/06/2012   Lactose intolerance  11/06/2012   Diverticulosis of colon without hemorrhage 11/06/2012    Current Outpatient Medications:    amiodarone (PACERONE) 200 MG tablet, TAKE 1 TABLET(200 MG) BY MOUTH DAILY, Disp: 30 tablet, Rfl: 5   apixaban (ELIQUIS) 5 MG TABS tablet, Take 1 tablet (5 mg total) by mouth 2 (two) times daily., Disp: 60 tablet, Rfl: 3   calcium carbonate (OS-CAL - DOSED IN MG OF  ELEMENTAL CALCIUM) 1250 (500 Ca) MG tablet, Take 1 tablet (500 mg of elemental calcium total) by mouth daily with breakfast., Disp: 30 tablet, Rfl: 11   carvedilol (COREG) 6.25 MG tablet, Take 1 tablet (6.25 mg total) by mouth 2 (two) times daily with a meal., Disp: 60 tablet, Rfl: 6   Cholecalciferol (VITAMIN D-3) 125 MCG (5000 UT) TABS, Take 1 tablet by mouth daily., Disp: 30 tablet, Rfl: 11   ezetimibe (ZETIA) 10 MG tablet, Take 1 tablet (10 mg total) by mouth daily., Disp: 90 tablet, Rfl: 3   FARXIGA 10 MG TABS tablet, Take 1 tablet (10 mg total) by mouth daily., Disp: 90 tablet, Rfl: 3   ferrous sulfate 325 (65 FE) MG tablet, Take 325 mg by mouth daily with breakfast., Disp: , Rfl:    folic acid (FOLVITE) 1 MG tablet, Take 1 tablet (1 mg total) by mouth daily., Disp: 30 tablet, Rfl: 11   gabapentin (NEURONTIN) 300 MG capsule, Take 300 mg by mouth daily., Disp: , Rfl:    methotrexate (RHEUMATREX) 2.5 MG tablet, Take 20 mg (8 tablets) every Wednesday. PROTECT FROM LIGHT AS DIRECTED, Disp: 40 tablet, Rfl: 3   Multiple Vitamin (MULTIVITAMIN) tablet, Take 1 tablet by mouth daily., Disp: , Rfl:    mupirocin ointment (BACTROBAN) 2 %, , Disp: , Rfl:    pantoprazole (PROTONIX) 40 MG tablet, Take 1 tablet (40 mg total) by mouth daily. 30 min before food, Disp: 90 tablet, Rfl: 3   sacubitril-valsartan (ENTRESTO) 24-26 MG, Take 1 tablet by mouth 2 (two) times daily., Disp: 180 tablet, Rfl: 3   spironolactone (ALDACTONE) 25 MG tablet, Take 1 tablet (25 mg total) by mouth daily., Disp: 30 tablet, Rfl: 6 Allergies  Allergen Reactions   Crestor [Rosuvastatin Calcium] Other (See Comments)    Elevated muscle enzymes    Lipitor [Atorvastatin Calcium] Other (See Comments)    Elevated muscle enzymes      Social History   Socioeconomic History   Marital status: Married    Spouse name: Gwendolyn   Number of children: 2   Years of education: Not on file   Highest education level: High school graduate   Occupational History   Occupation: Retired    Comment: Semi  Tobacco Use   Smoking status: Former    Packs/day: .5    Types: Cigarettes    Start date: 07/06/1971    Quit date: 06/26/2020    Years since quitting: 2.2   Smokeless tobacco: Never   Tobacco comments:    Former smoker 09/24/21  Vaping Use   Vaping Use: Never used  Substance and Sexual Activity   Alcohol use: No    Alcohol/week: 0.0 standard drinks of alcohol   Drug use: No   Sexual activity: Not on file  Other Topics Concern   Not on file  Social History Narrative   Married    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    On child    1 son, 1 daughter   Social worker op   Owns guns    Wears seat belt, safe in relationship  Smoker    Retired 06/27/2019   Social Determinants of Health   Financial Resource Strain: Low Risk  (08/11/2020)   Overall Financial Resource Strain (CARDIA)    Difficulty of Paying Living Expenses: Not hard at all  Food Insecurity: No Food Insecurity (12/11/2021)   Hunger Vital Sign    Worried About Running Out of Food in the Last Year: Never true    Ran Out of Food in the Last Year: Never true  Transportation Needs: No Transportation Needs (12/11/2021)   PRAPARE - Hydrologist (Medical): No    Lack of Transportation (Non-Medical): No  Physical Activity: Sufficiently Active (08/11/2020)   Exercise Vital Sign    Days of Exercise per Week: 5 days    Minutes of Exercise per Session: 30 min  Stress: No Stress Concern Present (08/11/2020)   [place]    Feeling of Stress : Not at all  Social Connections: Unknown (08/11/2020)   Social Connection and Isolation Panel [NHANES]    Frequency of Communication with Friends and Family: More than three times a week    Frequency of Social Gatherings with Friends and Family: More than three times a week    Attends Religious Services: Not on file    Active Member of Clubs or Organizations: Yes     Attends Archivist Meetings: Not on file    Marital Status: Married  Intimate Partner Violence: Not At Risk (08/11/2020)   Humiliation, Afraid, Rape, and Kick questionnaire    Fear of Current or Ex-Partner: No    Emotionally Abused: No    Physically Abused: No    Sexually Abused: No    Physical Exam      Future Appointments  Date Time Provider [place]  10/07/2022  3:00 PM Carollee Leitz, MD [facility]  10/13/2022  1:15 PM Lorenda Peck, DPM [facility]  12/10/2022  7:00 AM [facility] DEVICE REMOTES [facility]  03/11/2023  7:00 AM [facility]

## 2022-10-07 ENCOUNTER — Encounter: Payer: PPO | Admitting: Family Medicine

## 2022-10-13 ENCOUNTER — Ambulatory Visit: Payer: PPO | Admitting: Podiatry

## 2022-10-13 ENCOUNTER — Encounter: Payer: Self-pay | Admitting: Podiatry

## 2022-10-13 ENCOUNTER — Other Ambulatory Visit (HOSPITAL_COMMUNITY): Payer: Self-pay

## 2022-10-13 DIAGNOSIS — L600 Ingrowing nail: Secondary | ICD-10-CM

## 2022-10-13 DIAGNOSIS — G629 Polyneuropathy, unspecified: Secondary | ICD-10-CM | POA: Diagnosis not present

## 2022-10-13 NOTE — Progress Notes (Signed)
Paramedicine Encounter    Patient ID: John Keith, male    DOB: 23-Dec-1950, 72 y.o.   MRN: [id number]  Met with John Keith in the home today to review medications and fill pill boxes. He has an appointment today at the podiatrist and he has to be at work soon so our visit was limited today. I reviewed meds and confirmed same filling pill box for two weeks. Refills as noted called into [facility]: - Methotrexate and Gabapentin. We reviewed upcoming appointments and confirmed same. I plan to come out to see John Keith in two weeks. He agreed with plan.   [name], EMT-Paramedic [phone] 10/13/2022   ACTION: Home visit completed

## 2022-10-13 NOTE — Patient Instructions (Signed)

## 2022-10-13 NOTE — Progress Notes (Signed)
Subjective:  Patient ID: John Keith, male    DOB: 1950-12-25,   MRN: [id number]  Chief Complaint  Patient presents with   routine foot care    Patient has possible ingrown on the left great hallux, has been a problem for a couple days now, has soaked the foot with epsom salt    72 y.o. male presents for concern of left great toenail ingrown and infected. Patient relates its started a few days ago. Relates he has had issues with ingrowns in the past and this one has been painful  . Denies any other pedal complaints. Denies n/v/f/c.   Past Medical History:  Diagnosis Date   Allergy    Arthritis    Atrial flutter    Carpal tunnel syndrome on left    Chest pain of uncertain etiology 10/24/2019   COVID-19    07/07/19   DDD (degenerative disc disease), thoracolumbar    multilevel   Degenerative joint disease of left shoulder    02/2011    Diverticulosis    left colon (2008)    ED (erectile dysfunction)    02/2011    GERD (gastroesophageal reflux disease)    H. pylori infection    Hyperlipidemia    Hypertension    Insomnia    Lactose intolerance    Low back pain    PVC (premature ventricular contraction) 10/24/2019   Shingles    07/2018   Smoking    smoking since age 32 y.o   Toe fracture, left    4th in 2014   Vitamin D deficiency     Objective:  Physical Exam: Vascular: DP/PT pulses 2/4 bilateral. CFT <3 seconds. Normal hair growth on digits. No edema.  Skin. No lacerations or abrasions bilateral feet. Incurvation of medial border of left hallux with mild erythema and edema noted No purulence noted.  Musculoskeletal: MMT 5/5 bilateral lower extremities in DF, PF, Inversion and Eversion. Deceased ROM in DF of ankle joint.  Neurological: Sensation intact to light touch.   Assessment:   1. Ingrown left greater toenail   2. Neuropathy      Plan:  Patient was evaluated and treated and all questions answered. Discussed ingrown toenails etiology and treatment options  including procedure for removal vs conservative care.  Patient requesting removal of ingrown nail today. Procedure below.  Discussed procedure and post procedure care and patient expressed understanding.  Will follow-up in 2 weeks for nail check or sooner if any problems arise.    Procedure:  Procedure: partial Nail Avulsion of left hallux medial nail border.  Surgeon: Louann Sjogren, DPM  Pre-op Dx: Ingrown toenail with infection Post-op: Same  Place of Surgery: Office exam room.  Indications for surgery: Painful and ingrown toenail.    The patient is requesting removal of nail with chemical matrixectomy. Risks and complications were discussed with the patient for which they understand and written consent was obtained. Under sterile conditions a total of 3 mL of  1% lidocaine plain was infiltrated in a hallux block fashion. Once anesthetized, the skin was prepped in sterile fashion. A tourniquet was then applied. Next the medial aspect of hallux nail border was then sharply excised making sure to remove the entire offending nail border.  Next phenol was then applied under standard conditions and copiously irrigated. Silvadene was applied. A dry sterile dressing was applied. After application of the dressing the tourniquet was removed and there is found to be an immediate capillary refill time to the digit. The  patient tolerated the procedure well without any complications. Post procedure instructions were discussed the patient for which he verbally understood. Follow-up in two weeks for nail check or sooner if any problems are to arise. Discussed signs/symptoms of infection and directed to call the office immediately should any occur or go directly to the emergency room. In the meantime, encouraged to call the office with any questions, concerns, changes symptoms.   Louann Sjogren, DPM

## 2022-10-14 ENCOUNTER — Telehealth (HOSPITAL_COMMUNITY): Payer: Self-pay | Admitting: Licensed Clinical Social Worker

## 2022-10-14 ENCOUNTER — Ambulatory Visit: Payer: PPO | Admitting: Podiatry

## 2022-10-14 NOTE — Telephone Encounter (Signed)
HF Paramedicine Team Based Care Meeting  HF MD- NA  HF NP - Amy Clegg NP-C   [facility] HF Paramedicine  Katie Lynch [employer]  Dede Smith  [facility] admit within the last 30 days for heart failure?   Medications concerns? Think he has good understanding of what medications are and need to work on getting him to fill it himself.  Working on medication assistance to help with med costs.  Transportation issues ? no  Education needs? no  SDOH -none  Eligible for discharge?  Awaiting further follow up from [facility] which could lead to more confusing medication changes- will plan to follow until we know more about his medical path.   Burna Sis, LCSW Clinical Social Worker [facility] Desk#: [phone] Cell#: [phone]

## 2022-10-18 ENCOUNTER — Other Ambulatory Visit (HOSPITAL_COMMUNITY): Payer: Self-pay

## 2022-10-18 ENCOUNTER — Telehealth: Payer: Self-pay | Admitting: *Deleted

## 2022-10-18 MED ORDER — METHOTREXATE SODIUM 2.5 MG PO TABS: ORAL_TABLET | ORAL | 0 refills | Status: DC

## 2022-10-20 DIAGNOSIS — R040 Epistaxis: Secondary | ICD-10-CM | POA: Diagnosis not present

## 2022-10-27 ENCOUNTER — Ambulatory Visit: Payer: PPO | Admitting: Podiatry

## 2022-10-27 ENCOUNTER — Encounter: Payer: Self-pay | Admitting: Podiatry

## 2022-10-27 ENCOUNTER — Other Ambulatory Visit (HOSPITAL_COMMUNITY): Payer: Self-pay

## 2022-10-27 DIAGNOSIS — L600 Ingrowing nail: Secondary | ICD-10-CM

## 2022-10-27 NOTE — Progress Notes (Signed)
  Subjective:  Patient ID: John Keith, male    DOB: 1950/11/25,   MRN: [id number]  No chief complaint on file.   72 y.o. male presents for follow-up of left ingrown nail procedure. Relates doing well and soaking as instrcuted.  . Denies any other pedal complaints. Denies n/v/f/c.   Past Medical History:  Diagnosis Date   Allergy    Arthritis    Atrial flutter    Carpal tunnel syndrome on left    Chest pain of uncertain etiology 10/24/2019   COVID-19    07/07/19   DDD (degenerative disc disease), thoracolumbar    multilevel   Degenerative joint disease of left shoulder    02/2011    Diverticulosis    left colon (2008)    ED (erectile dysfunction)    02/2011    GERD (gastroesophageal reflux disease)    H. pylori infection    Hyperlipidemia    Hypertension    Insomnia    Lactose intolerance    Low back pain    PVC (premature ventricular contraction) 10/24/2019   Shingles    07/2018   Smoking    smoking since age 42 y.o   Toe fracture, left    4th in 2014   Vitamin D deficiency     Objective:  Physical Exam: Vascular: DP/PT pulses 2/4 bilateral. CFT <3 seconds. Normal hair growth on digits. No edema.  Skin. No lacerations or abrasions bilateral feet. Left hallux nail healing well.  Musculoskeletal: MMT 5/5 bilateral lower extremities in DF, PF, Inversion and Eversion. Deceased ROM in DF of ankle joint.  Neurological: Sensation intact to light touch.   Assessment:   1. Ingrown left greater toenail      Plan:  Patient was evaluated and treated and all questions answered. Toe was evaluated and appears to be healing well.  May discontinue soaks and neosporin.  Patient to follow-up as needed.    Louann Sjogren, DPM

## 2022-10-27 NOTE — Progress Notes (Signed)
Paramedicine Encounter    Patient ID: John Keith, male    DOB: [birth year], 72 y.o.   MRN: [id number]   Complaints- having episodes of dizziness (yesterday at 1300, and today at 0930)  Assessment- CAOX4, warm and dry, no swelling, no shortness of breath, no chest pain, dizziness when seated does not increase upon standing. No orthostatic changes. Lungs clear.   Compliance with meds- no missed doses.   Pill box filled- for two weeks.   Refills needed- spironolactone, entresto   Meds changes since last visit- none     Social changes- none   Arrived for home visit for John Keith who reports to be feeling okay today other than having some dizzy spells one yesterday at 1300 and one today 0930. He described it as being dizzy while being seated. He denied any dizziness upon standing. Orthostatic vitals obtained. No changes. He denied any chest pain, shortness of breath. I advised John Keith to be sure to check his blood pressure often and to chart same and to let me know when and if it drops and if he has any symptoms. I reviewed meds and confirmed same. Pill box filled for two weeks. We reviewed upcoming appointments. Home visit complete. I will see John Keith in two weeks.   Refills: Spironolactone Eliquis   BP (!) 88/54   Pulse (!) 54   Resp 16   Wt 237 lb (107.5 kg)   SpO2 95%   BMI 25.40 kg/m  Weight yesterday- 237lbs  Last visit weight-234lbs    [name], EMT-Paramedic [phone]  ACTION: Home visit completed    Patient Care Team: Pcp, No as PCP - General Chilton Si, MD as PCP - Cardiology (Cardiology) Regan Lemming, MD as PCP - Electrophysiology (Cardiology) Pincus Sanes, MD as Consulting Physician (Internal Medicine)  Patient Active Problem List   Diagnosis Date Noted   Chronic systolic heart failure 12/15/2021   Chronic combined systolic and diastolic CHF (congestive heart failure) 12/15/2021   Nasal sore 12/15/2021   Syncope 12/07/2021    Ventricular tachycardia 12/07/2021   Typical atrial flutter 09/24/2021   Secondary hypercoagulable state 09/24/2021   Abnormal x-ray of neck 06/16/2021   Abnormal MRI, lumbar spine 10/23/2020   Chronic low back pain 10/23/2020   Neuropathy 10/23/2020   Coronary artery disease of native artery of native heart with stable angina pectoris 10/23/2020   Impingement syndrome of left shoulder region 08/22/2020   Pain in joint of left shoulder 07/24/2020   Chronic left shoulder pain 07/11/2020   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   Anemia 12/13/2019   Chest pain of uncertain etiology 10/24/2019   PVC (premature ventricular contraction) 10/24/2019   Gastric ulcer without hemorrhage or perforation 09/28/2019   COVID-19 virus detected 07/12/2019   Elevated troponin 07/07/2019   Elevated serum protein level 07/07/2019   Postherpetic neuralgia 08/17/2018   Benign prostatic hyperplasia 08/01/2018   Hip pain 06/29/2018   Aortic atherosclerosis 09/22/2016   Fatigue 06/03/2016   Lower urinary tract symptoms (LUTS) 06/03/2016   Constipation 03/10/2016   Lumbar radiculopathy 02/20/2016   Encounter for immunization 11/26/2015   Insomnia 06/10/2015   External hemorrhoid 03/07/2015   Health care maintenance 01/09/2014   Carpal tunnel syndrome of left wrist 01/03/2013   Hyperlipidemia 11/09/2012   Vitamin D deficiency 11/09/2012   Essential hypertension 11/06/2012   Tobacco abuse 11/06/2012   Seasonal allergies 11/06/2012   Erectile dysfunction 11/06/2012   GERD (gastroesophageal reflux disease) 11/06/2012   DDD (degenerative disc disease), thoracolumbar 11/06/2012  Lactose intolerance 11/06/2012   Diverticulosis of colon without hemorrhage 11/06/2012    Current Outpatient Medications:    amiodarone (PACERONE) 200 MG tablet, TAKE 1 TABLET(200 MG) BY MOUTH DAILY, Disp: 30 tablet, Rfl: 5   apixaban (ELIQUIS) 5 MG TABS tablet, Take 1 tablet (5 mg total) by mouth 2 (two) times daily., Disp: 60  tablet, Rfl: 3   calcium carbonate (OS-CAL - DOSED IN MG OF ELEMENTAL CALCIUM) 1250 (500 Ca) MG tablet, Take 1 tablet (500 mg of elemental calcium total) by mouth daily with breakfast., Disp: 30 tablet, Rfl: 11   carvedilol (COREG) 6.25 MG tablet, Take 1 tablet (6.25 mg total) by mouth 2 (two) times daily with a meal., Disp: 60 tablet, Rfl: 6   Cholecalciferol (VITAMIN D-3) 125 MCG (5000 UT) TABS, Take 1 tablet by mouth daily., Disp: 30 tablet, Rfl: 11   ezetimibe (ZETIA) 10 MG tablet, Take 1 tablet (10 mg total) by mouth daily., Disp: 90 tablet, Rfl: 3   FARXIGA 10 MG TABS tablet, Take 1 tablet (10 mg total) by mouth daily., Disp: 90 tablet, Rfl: 3   ferrous sulfate 325 (65 FE) MG tablet, Take 325 mg by mouth daily with breakfast., Disp: , Rfl:    folic acid (FOLVITE) 1 MG tablet, Take 1 tablet (1 mg total) by mouth daily., Disp: 30 tablet, Rfl: 11   gabapentin (NEURONTIN) 300 MG capsule, Take 300 mg by mouth daily., Disp: , Rfl:    methotrexate (RHEUMATREX) 2.5 MG tablet, Take 20 mg (8 tablets) every Wednesday. PROTECT FROM LIGHT AS DIRECTED, Disp: 150 tablet, Rfl: 0   Multiple Vitamin (MULTIVITAMIN) tablet, Take 1 tablet by mouth daily., Disp: , Rfl:    mupirocin ointment (BACTROBAN) 2 %, , Disp: , Rfl:    pantoprazole (PROTONIX) 40 MG tablet, Take 1 tablet (40 mg total) by mouth daily. 30 min before food, Disp: 90 tablet, Rfl: 3   sacubitril-valsartan (ENTRESTO) 24-26 MG, Take 1 tablet by mouth 2 (two) times daily., Disp: 180 tablet, Rfl: 3   spironolactone (ALDACTONE) 25 MG tablet, Take 1 tablet (25 mg total) by mouth daily., Disp: 30 tablet, Rfl: 6 Allergies  Allergen Reactions   Crestor [Rosuvastatin Calcium] Other (See Comments)    Elevated muscle enzymes    Lipitor [Atorvastatin Calcium] Other (See Comments)    Elevated muscle enzymes      Social History   Socioeconomic History   Marital status: Married    Spouse name: John Keith   Number of children: 2   Years of education:  Not on file   Highest education level: High school graduate  Occupational History   Occupation: Retired    Comment: Semi  Tobacco Use   Smoking status: Former    Packs/day: .5    Types: Cigarettes    Start date: 07/06/1971    Quit date: 06/26/2020    Years since quitting: 2.3   Smokeless tobacco: Never   Tobacco comments:    Former smoker 09/24/21  Vaping Use   Vaping Use: Never used  Substance and Sexual Activity   Alcohol use: No    Alcohol/week: 0.0 standard drinks of alcohol   Drug use: No   Sexual activity: Not on file  Other Topics Concern   Not on file  Social History Narrative   Married    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    On child    1 son, 1 daughter   Systems analyst op   Owns guns    Wears seat belt, safe in relationship  Smoker    Retired 06/27/2019   Social Determinants of Health   Financial Resource Strain: Low Risk  (08/11/2020)   Overall Financial Resource Strain (CARDIA)    Difficulty of Paying Living Expenses: Not hard at all  Food Insecurity: No Food Insecurity (12/11/2021)   Hunger Vital Sign    Worried About Running Out of Food in the Last Year: Never true    Ran Out of Food in the Last Year: Never true  Transportation Needs: No Transportation Needs (12/11/2021)   PRAPARE - Administrator, Civil Service (Medical): No    Lack of Transportation (Non-Medical): No  Physical Activity: Sufficiently Active (08/11/2020)   Exercise Vital Sign    Days of Exercise per Week: 5 days    Minutes of Exercise per Session: 30 min  Stress: No Stress Concern Present (08/11/2020)   [employer] of Occupational Health - Occupational Stress Questionnaire    Feeling of Stress : Not at all  Social Connections: Unknown (08/11/2020)   Social Connection and Isolation Panel [NHANES]    Frequency of Communication with Friends and Family: More than three times a week    Frequency of Social Gatherings with Friends and Family: More than three times a week    Attends Religious Services: Not  on file    Active Member of Clubs or Organizations: Yes    Attends Banker Meetings: Not on file    Marital Status: Married  Intimate Partner Violence: Not At Risk (08/11/2020)   Humiliation, Afraid, Rape, and Kick questionnaire    Fear of Current or Ex-Partner: No    Emotionally Abused: No    Physically Abused: No    Sexually Abused: No    Physical Exam      Future Appointments  Date Time Provider Department Center  10/29/2022  9:20 AM Dana Allan, MD [facility]  12/10/2022  7:00 AM [facility] DEVICE REMOTES [facility]  01/19/2023  1:15 PM McCaughan, Dia D, DPM [facility]  03/11/2023  7:00 AM [facility] DEVICE REMOTES [facility]

## 2022-10-28 NOTE — Telephone Encounter (Signed)
Advanced Heart Failure Patient Advocate Encounter ° °Sent in OOP to [facility]. ° °

## 2022-10-29 ENCOUNTER — Ambulatory Visit (INDEPENDENT_AMBULATORY_CARE_PROVIDER_SITE_OTHER): Payer: PPO | Admitting: Family Medicine

## 2022-10-29 VITALS — BP 118/70 | HR 65 | Temp 97.9°F | Resp 16 | Ht >= 80 in | Wt 241.6 lb

## 2022-10-29 DIAGNOSIS — T466X5A Adverse effect of antihyperlipidemic and antiarteriosclerotic drugs, initial encounter: Secondary | ICD-10-CM | POA: Diagnosis not present

## 2022-10-29 DIAGNOSIS — I483 Typical atrial flutter: Secondary | ICD-10-CM | POA: Diagnosis not present

## 2022-10-29 DIAGNOSIS — R7309 Other abnormal glucose: Secondary | ICD-10-CM | POA: Diagnosis not present

## 2022-10-29 DIAGNOSIS — G629 Polyneuropathy, unspecified: Secondary | ICD-10-CM | POA: Diagnosis not present

## 2022-10-29 DIAGNOSIS — E559 Vitamin D deficiency, unspecified: Secondary | ICD-10-CM | POA: Diagnosis not present

## 2022-10-29 DIAGNOSIS — I700 Atherosclerosis of aorta: Secondary | ICD-10-CM

## 2022-10-29 DIAGNOSIS — E785 Hyperlipidemia, unspecified: Secondary | ICD-10-CM

## 2022-10-29 DIAGNOSIS — I472 Ventricular tachycardia, unspecified: Secondary | ICD-10-CM | POA: Diagnosis not present

## 2022-10-29 DIAGNOSIS — I5042 Chronic combined systolic (congestive) and diastolic (congestive) heart failure: Secondary | ICD-10-CM

## 2022-10-29 DIAGNOSIS — Z125 Encounter for screening for malignant neoplasm of prostate: Secondary | ICD-10-CM | POA: Diagnosis not present

## 2022-10-29 DIAGNOSIS — I10 Essential (primary) hypertension: Secondary | ICD-10-CM

## 2022-10-29 LAB — VITAMIN B12: Vitamin B-12: 730 pg/mL (ref 211–911)

## 2022-10-29 LAB — LIPID PANEL
Cholesterol: 164 mg/dL (ref 0–200)
HDL: 49 mg/dL (ref >39.00)
LDL Cholesterol: 102 mg/dL — ABNORMAL HIGH (ref 0–99)
NonHDL: 115.11
Total CHOL/HDL Ratio: 3
Triglycerides: 68 mg/dL (ref 0.0–149.0)
VLDL: 13.6 mg/dL (ref 0.0–40.0)

## 2022-10-29 LAB — PSA: PSA: 2.29 ng/mL (ref 0.10–4.00)

## 2022-10-29 LAB — VITAMIN D 25 HYDROXY (VIT D DEFICIENCY, FRACTURES): VITD: 67.66 ng/mL (ref 30.00–100.00)

## 2022-10-29 LAB — HEMOGLOBIN A1C: Hgb A1c MFr Bld: 5.6 % (ref 4.6–6.5)

## 2022-10-29 NOTE — Progress Notes (Signed)
SUBJECTIVE:   Chief Complaint  Patient presents with   Transitions Of Care   HPI Presents to clinic to transfer care.  No acute concerns today.  Would like to have PSA checked today.  Hypertension/HFrEF/CAD/A-flutter/history of VT Asymptomatic.  Currently takes Coreg 6.25 mg twice daily, Farxiga 10 mg daily, Entresto 24-25 mg twice daily, spironolactone 25 mg daily, Eliquis 5 mg twice daily, amiodarone 200 mg daily and methotrexate  25 mg q. Wednesday.  Tolerating medication well. Follows with cardiology.  Has ICD in place  BPH Asymptomatic.  Last PSA 1.35.  Denies any hematuria, or urinary symptoms.  Chronic sinusitis Patient requesting ENT consult for chronic sinusitis.  Was last seen at [facility] for similar symptoms.  Not currently having symptoms today.  Reports using mupirocin daily.  Denies any fevers, nasal pressure, headaches, bleeding.  PERTINENT PMH / PSH: Hypertension CAD A-flutter on Eliquis VT HFrEF DDD BPH Chronic sinusitis  OBJECTIVE:  BP 118/70   Pulse 65   Temp 97.9 F (36.6 C)   Resp 16   Ht 6\' 9"  (2.057 m)   Wt 241 lb 9.6 oz (109.6 kg)   SpO2 97%   BMI 25.89 kg/m    Physical Exam Constitutional:      General: He is not in acute distress.    Appearance: Normal appearance. He is normal weight. He is not ill-appearing.  HENT:     Head: Normocephalic.  Eyes:     Conjunctiva/sclera: Conjunctivae normal.  Neck:     Thyroid: No thyromegaly or thyroid tenderness.  Cardiovascular:     Rate and Rhythm: Normal rate and regular rhythm.     Pulses: Normal pulses.  Pulmonary:     Effort: Pulmonary effort is normal.     Breath sounds: Normal breath sounds.  Abdominal:     General: Bowel sounds are normal.  Neurological:     Mental Status: He is alert. Mental status is at baseline.  Psychiatric:        Mood and Affect: Mood normal.        Behavior: Behavior normal.        Thought Content: Thought content normal.        Judgment: Judgment  normal.     ASSESSMENT/PLAN:  Aortic atherosclerosis (HCC) Assessment & Plan: Chronic.  Unable to take statin secondary to elevated muscle enzymes as documented. Currently on Zetia 10 mg daily Check fasting lipids   Chronic combined systolic and diastolic CHF (congestive heart failure) (HCC) Assessment & Plan: Chronic.  Euvolemic on exam.  Asymptomatic. Continue GDMT Continue to follow-up with cardiology as scheduled.   Typical atrial flutter (HCC) Assessment & Plan: History of a flutter on Eliquis Rate controlled Taking carvedilol 6.25 mg twice daily Follows with cardiology   Ventricular tachycardia (HCC) Assessment & Plan: Chronic.  ICD implant.  Follows with cardiology.   Prostate cancer screening -     PSA  Abnormal glucose -     Hemoglobin A1c  Vitamin D deficiency -     VITAMIN D 25 Hydroxy (Vit-D Deficiency, Fractures)  Hyperlipidemia, unspecified hyperlipidemia type -     Lipid panel  Neuropathy Assessment & Plan: Chronic.  Controlled with gabapentin 900 mg at night Check A1c and B12  Orders: -     Vitamin B12  Essential hypertension Assessment & Plan: Chronic.  Well-controlled on current medications. Continue Coreg 6.25 mg twice daily Continue Aldactone 25 mg daily Follow-up with cardiology as scheduled.   Adverse reaction to statin medication Assessment &  Plan: Unable to take statin therapy secondary to increased LFTs. Currently on Zetia for hyperlipidemia Check fasting lipids    PDMP reviewed  Return in about 3 months (around 01/28/2023) for PCP.  Dana Allan, MD

## 2022-10-29 NOTE — Patient Instructions (Signed)
It was a pleasure meeting you today. Thank you for allowing me to take part in your health care.  Our goals for today as we discussed include:  We will get some labs today.  If they are abnormal or we need to do something about them, I will call you.  If they are normal, I will send you a message on MyChart (if it is active) or a letter in the mail.  If you don't hear from us in 2 weeks, please call the office at the number below.     If you have any questions or concerns, please do not hesitate to call the office at [phone].  I look forward to our next visit and until then take care and stay safe.  Regards,   Sandie Swayze, MD   [place]  

## 2022-11-01 ENCOUNTER — Other Ambulatory Visit (HOSPITAL_COMMUNITY): Payer: Self-pay | Admitting: Family Medicine

## 2022-11-02 ENCOUNTER — Encounter: Payer: Self-pay | Admitting: *Deleted

## 2022-11-03 DIAGNOSIS — R040 Epistaxis: Secondary | ICD-10-CM | POA: Diagnosis not present

## 2022-11-10 ENCOUNTER — Other Ambulatory Visit (HOSPITAL_COMMUNITY): Payer: Self-pay

## 2022-11-10 NOTE — Progress Notes (Signed)
Paramedicine Encounter    Patient ID: John Keith, male    DOB: 1951-03-07, 72 y.o.   MRN: [id number]  Arrived for home visit for John Keith who reports feeling okay but continuing to have small episodes of random dizziness but has been checking his BP and the readings range from 110 systolic to 160 systolic. He states that he will have a few mins of dizziness but not to the point where it is affecting his balance then it goes away. I obtained vitals today and they are as noted. I advised Claudio to be sure to take his vitals an hour after taking his medications and record same. Assessment noted clear lungs, no edema, vitals within normal limits. I reviewed meds and confirmed same filled two weeks of pill boxes. Refills needed to be called in to [facility]- Zetia. We reviewed upcoming appointments. I reminded him of symptoms to be aware of with dizziness and if his BP readings get low and to call paramedicine team members Katie or Dede during my absence next week. He agreed with plan and felt comfortable with this.  Home visit complete. I will see Ammar in two weeks.   -he has not heard from [facility] to schedule Cardiac PET.   [name], EMT-Paramedic [phone] 11/10/2022      Patient Care Team: Dana Allan, MD as PCP - General (Family Medicine) Pincus Sanes, MD as Consulting Physician (Internal Medicine) Chilton Si, MD as Attending Physician (Cardiology) Regan Lemming, MD as Consulting Physician (Cardiology)  Patient Active Problem List   Diagnosis Date Noted   Chronic systolic heart failure (HCC) 12/15/2021   Chronic combined systolic and diastolic CHF (congestive heart failure) (HCC) 12/15/2021   Nasal sore 12/15/2021   Syncope 12/07/2021   Ventricular tachycardia (HCC) 12/07/2021   Typical atrial flutter (HCC) 09/24/2021   Secondary hypercoagulable state (HCC) 09/24/2021   Abnormal x-ray of neck 06/16/2021   Abnormal MRI, lumbar spine 10/23/2020   Chronic low back  pain 10/23/2020   Neuropathy 10/23/2020   Coronary artery disease of native artery of native heart with stable angina pectoris (HCC) 10/23/2020   Impingement syndrome of left shoulder region 08/22/2020   Pain in joint of left shoulder 07/24/2020   Chronic left shoulder pain 07/11/2020   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   Anemia 12/13/2019   Chest pain of uncertain etiology 10/24/2019   PVC (premature ventricular contraction) 10/24/2019   Gastric ulcer without hemorrhage or perforation 09/28/2019   COVID-19 virus detected 07/12/2019   Elevated troponin 07/07/2019   Elevated serum protein level 07/07/2019   Postherpetic neuralgia 08/17/2018   Benign prostatic hyperplasia 08/01/2018   Hip pain 06/29/2018   Aortic atherosclerosis (HCC) 09/22/2016   Fatigue 06/03/2016   Lower urinary tract symptoms (LUTS) 06/03/2016   Constipation 03/10/2016   Lumbar radiculopathy 02/20/2016   Encounter for immunization 11/26/2015   Insomnia 06/10/2015   External hemorrhoid 03/07/2015   Health care maintenance 01/09/2014   Carpal tunnel syndrome of left wrist 01/03/2013   Hyperlipidemia 11/09/2012   Vitamin D deficiency 11/09/2012   Essential hypertension 11/06/2012   Tobacco abuse 11/06/2012   Seasonal allergies 11/06/2012   Erectile dysfunction 11/06/2012   GERD (gastroesophageal reflux disease) 11/06/2012   DDD (degenerative disc disease), thoracolumbar 11/06/2012   Lactose intolerance 11/06/2012   Diverticulosis of colon without hemorrhage 11/06/2012    Current Outpatient Medications:    amiodarone (PACERONE) 200 MG tablet, TAKE 1 TABLET(200 MG) BY MOUTH DAILY, Disp: 30 tablet, Rfl: 5   apixaban (ELIQUIS) 5  MG TABS tablet, Take 1 tablet (5 mg total) by mouth 2 (two) times daily., Disp: 60 tablet, Rfl: 3   calcium carbonate (OS-CAL - DOSED IN MG OF ELEMENTAL CALCIUM) 1250 (500 Ca) MG tablet, Take 1 tablet (500 mg of elemental calcium total) by mouth daily with breakfast., Disp: 30 tablet, Rfl:  11   carvedilol (COREG) 6.25 MG tablet, Take 1 tablet (6.25 mg total) by mouth 2 (two) times daily with a meal., Disp: 60 tablet, Rfl: 6   Cholecalciferol (VITAMIN D-3) 125 MCG (5000 UT) TABS, Take 1 tablet by mouth daily., Disp: 30 tablet, Rfl: 11   ezetimibe (ZETIA) 10 MG tablet, Take 1 tablet (10 mg total) by mouth daily., Disp: 90 tablet, Rfl: 3   FARXIGA 10 MG TABS tablet, Take 1 tablet (10 mg total) by mouth daily., Disp: 90 tablet, Rfl: 3   ferrous sulfate 325 (65 FE) MG tablet, Take 325 mg by mouth daily with breakfast., Disp: , Rfl:    folic acid (FOLVITE) 1 MG tablet, Take 1 tablet (1 mg total) by mouth daily., Disp: 30 tablet, Rfl: 11   gabapentin (NEURONTIN) 300 MG capsule, Take 900 mg by mouth daily., Disp: , Rfl:    methotrexate (RHEUMATREX) 2.5 MG tablet, Take 20 mg (8 tablets) every Wednesday. PROTECT FROM LIGHT AS DIRECTED, Disp: 150 tablet, Rfl: 0   Multiple Vitamin (MULTIVITAMIN) tablet, Take 1 tablet by mouth daily., Disp: , Rfl:    mupirocin ointment (BACTROBAN) 2 %, , Disp: , Rfl:    pantoprazole (PROTONIX) 40 MG tablet, Take 1 tablet (40 mg total) by mouth daily. 30 min before food, Disp: 90 tablet, Rfl: 3   sacubitril-valsartan (ENTRESTO) 24-26 MG, Take 1 tablet by mouth 2 (two) times daily., Disp: 180 tablet, Rfl: 3   spironolactone (ALDACTONE) 25 MG tablet, TAKE 1 TABLET(25 MG) BY MOUTH DAILY, Disp: 90 tablet, Rfl: 0 Allergies  Allergen Reactions   Crestor [Rosuvastatin Calcium] Other (See Comments)    Elevated muscle enzymes    Lipitor [Atorvastatin Calcium] Other (See Comments)    Elevated muscle enzymes      Social History   Socioeconomic History   Marital status: Married    Spouse name: Gwendolyn   Number of children: 2   Years of education: Not on file   Highest education level: High school graduate  Occupational History   Occupation: Retired    Comment: Semi  Tobacco Use   Smoking status: Former    Packs/day: .5    Types: Cigarettes    Start  date: 07/06/1971    Quit date: 06/26/2020    Years since quitting: 2.3   Smokeless tobacco: Never   Tobacco comments:    Former smoker 09/24/21  Vaping Use   Vaping Use: Never used  Substance and Sexual Activity   Alcohol use: No    Alcohol/week: 0.0 standard drinks of alcohol   Drug use: No   Sexual activity: Not on file  Other Topics Concern   Not on file  Social History Narrative   Married    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    On child    1 son, 1 daughter   Machine op   Owns guns    Wears seat belt, safe in relationship    Smoker    Retired 06/27/2019   Social Determinants of Health   Financial Resource Strain: Low Risk  (08/11/2020)   Overall Financial Resource Strain (CARDIA)    Difficulty of Paying Living Expenses: Not hard at all  Food Insecurity: No Food Insecurity (12/11/2021)   Hunger Vital Sign    Worried About Running Out of Food in the Last Year: Never true    Ran Out of Food in the Last Year: Never true  Transportation Needs: No Transportation Needs (12/11/2021)   PRAPARE - Administrator, Civil Service (Medical): No    Lack of Transportation (Non-Medical): No  Physical Activity: Sufficiently Active (08/11/2020)   Exercise Vital Sign    Days of Exercise per Week: 5 days    Minutes of Exercise per Session: 30 min  Stress: No Stress Concern Present (08/11/2020)   [employer] of Occupational Health - Occupational Stress Questionnaire    Feeling of Stress : Not at all  Social Connections: Unknown (08/11/2020)   Social Connection and Isolation Panel [NHANES]    Frequency of Communication with Friends and Family: More than three times a week    Frequency of Social Gatherings with Friends and Family: More than three times a week    Attends Religious Services: Not on file    Active Member of Clubs or Organizations: Yes    Attends Banker Meetings: Not on file    Marital Status: Married  Intimate Partner Violence: Not At Risk (08/11/2020)   Humiliation,  Afraid, Rape, and Kick questionnaire    Fear of Current or Ex-Partner: No    Emotionally Abused: No    Physically Abused: No    Sexually Abused: No    Physical Exam      Future Appointments  Date Time Provider Department Center  12/10/2022  7:00 AM [facility]  01/19/2023  1:15 PM McCaughan, Dia D, DPM [facility]  01/31/2023  8:20 AM Dana Allan, MD [facility]  03/11/2023  7:00 AM [facility]     ACTION: Home visit completed

## 2022-11-15 ENCOUNTER — Encounter: Payer: Self-pay | Admitting: Family Medicine

## 2022-11-15 DIAGNOSIS — G720 Drug-induced myopathy: Secondary | ICD-10-CM | POA: Insufficient documentation

## 2022-11-15 DIAGNOSIS — Z125 Encounter for screening for malignant neoplasm of prostate: Secondary | ICD-10-CM | POA: Insufficient documentation

## 2022-11-15 DIAGNOSIS — T466X5A Adverse effect of antihyperlipidemic and antiarteriosclerotic drugs, initial encounter: Secondary | ICD-10-CM | POA: Insufficient documentation

## 2022-11-15 DIAGNOSIS — R7309 Other abnormal glucose: Secondary | ICD-10-CM | POA: Insufficient documentation

## 2022-11-15 DIAGNOSIS — Z122 Encounter for screening for malignant neoplasm of respiratory organs: Secondary | ICD-10-CM | POA: Insufficient documentation

## 2022-11-15 IMAGING — DX DG CERVICAL SPINE COMPLETE 4+V
5 series · 5 of 5 positions shown · non-contrast
Comparison: 03/02/2011

CLINICAL DATA: Neck pain and right hand numbness, initial encounter

EXAM:
CERVICAL SPINE - COMPLETE 4+ VIEW

[c-spine lat]
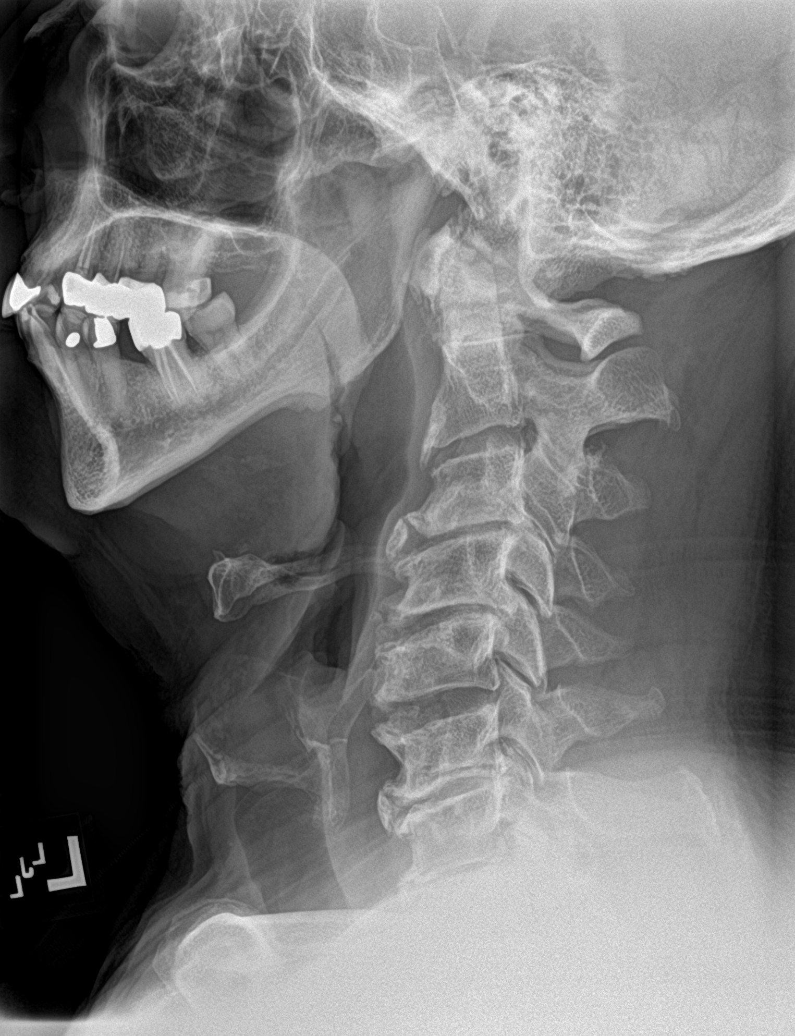

[c-spine obl (1 of 2)]
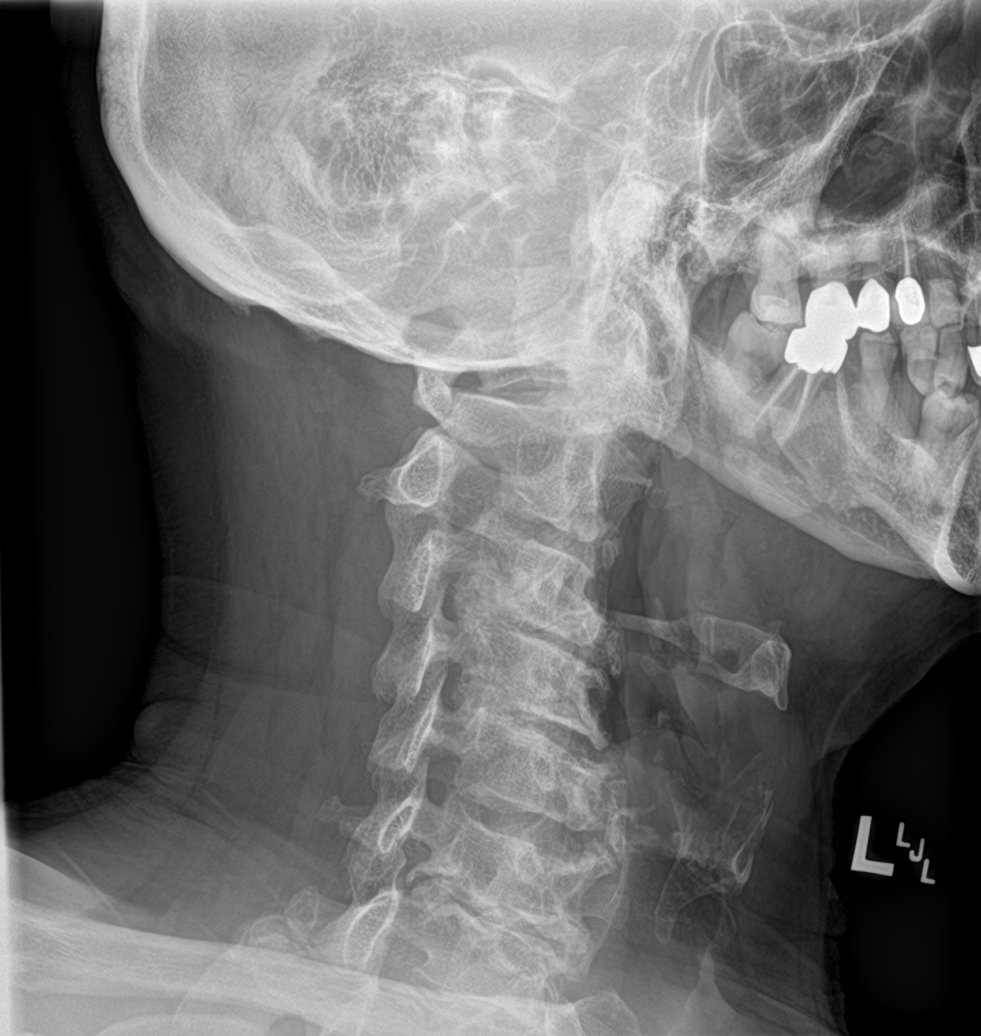

[c-spine obl (2 of 2)]
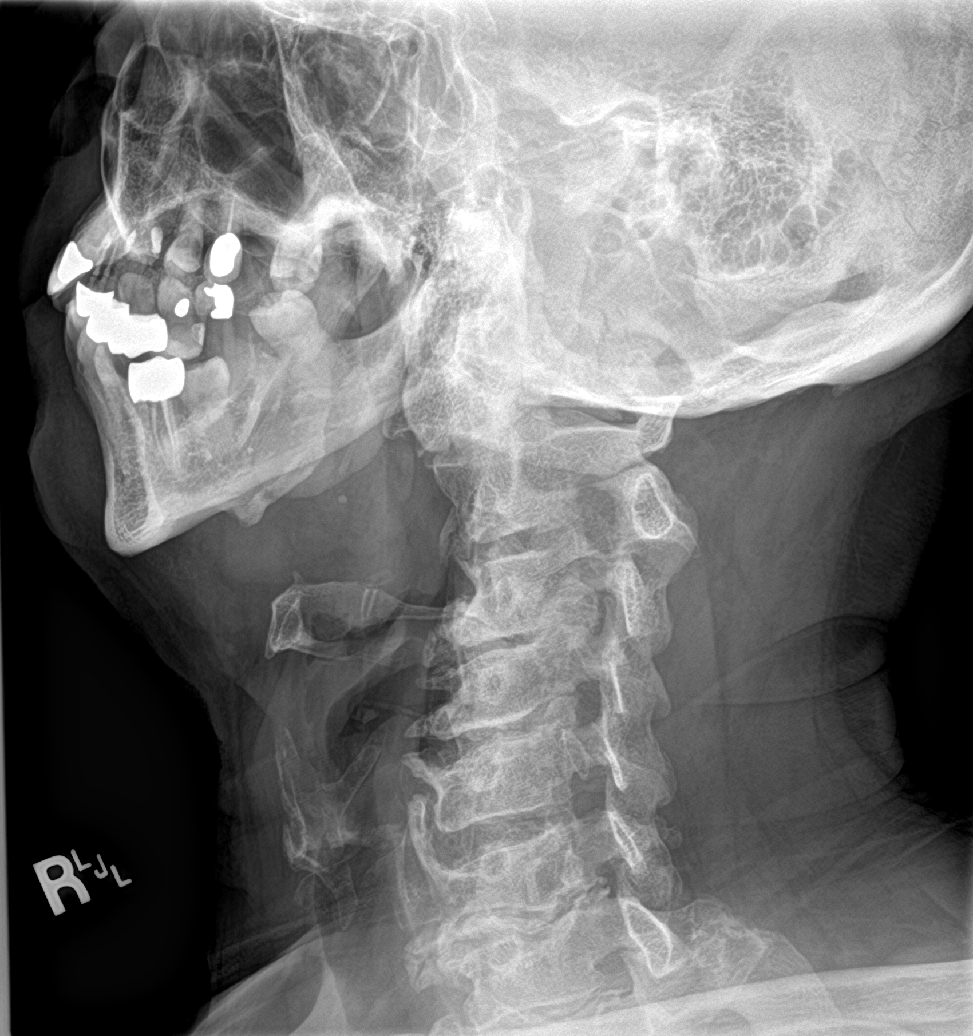

[c-spine ap]
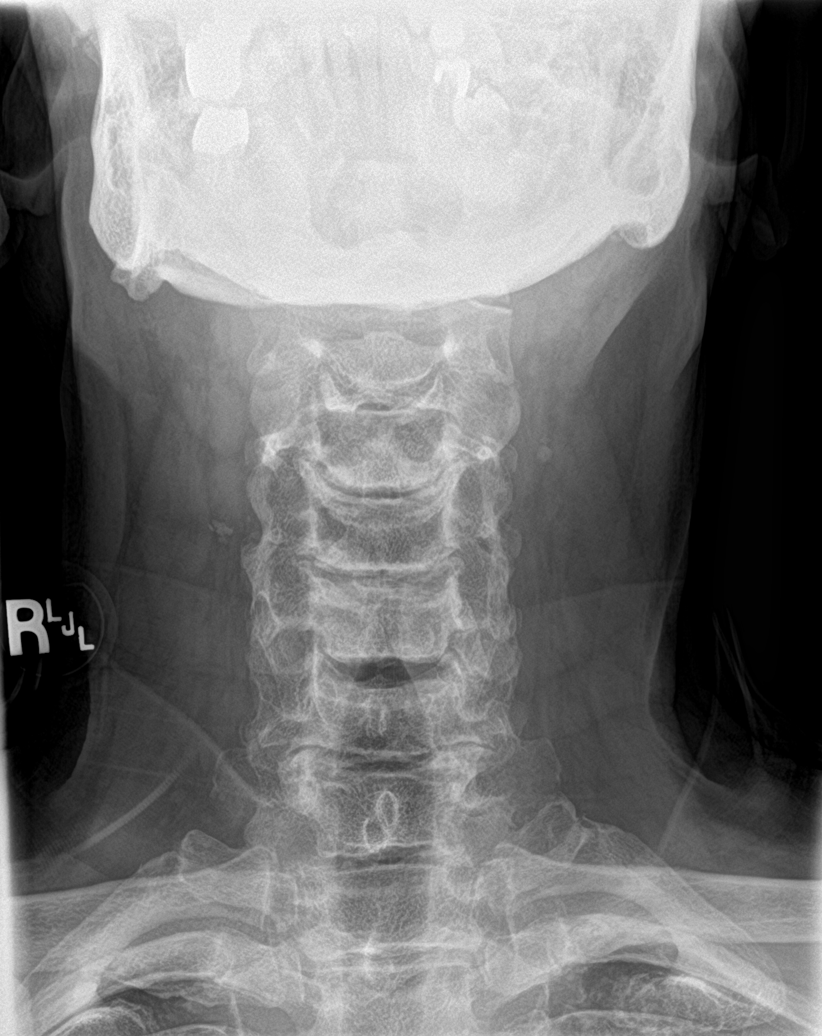

[c-spine open mouth]
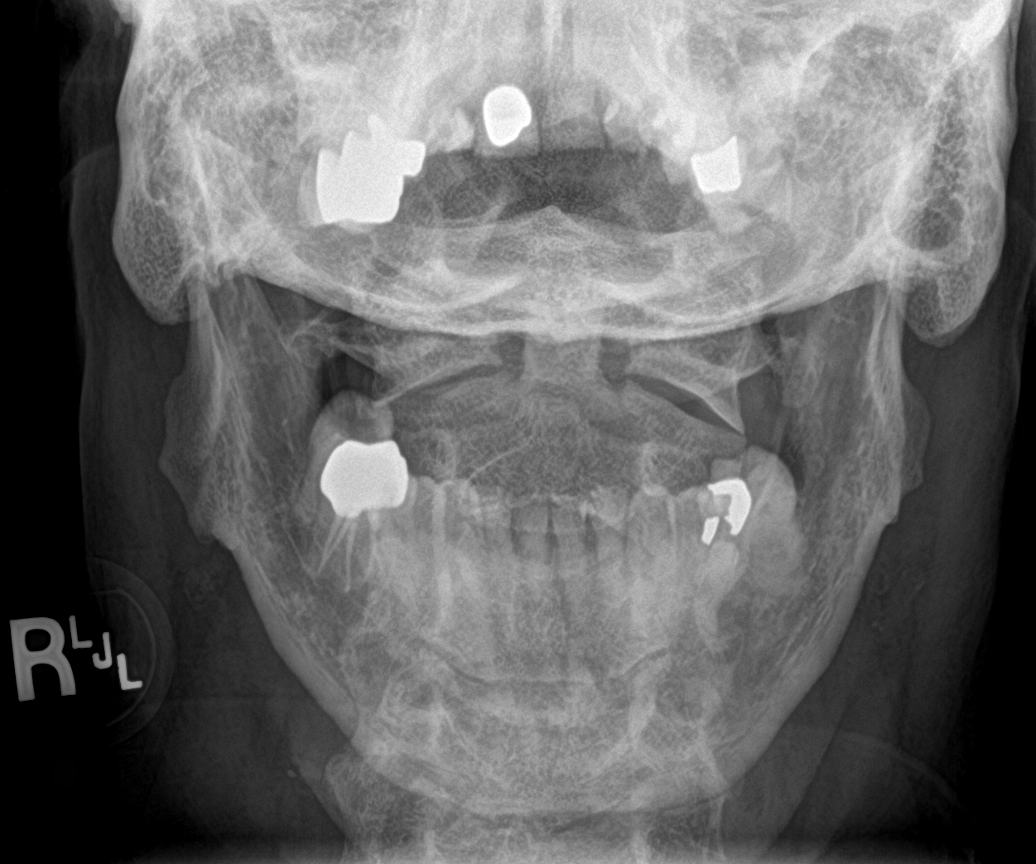

[5 of 5 positions shown; findings below may reference images not displayed]

FINDINGS: Seven cervical segments are well visualized. Vertebral body height
is well maintained. Multilevel osteophytic changes are noted similar
to that seen on the prior examination. Mild neural foraminal
narrowing is noted bilaterally slightly worse on the right than the
left from C3 to C7. No soft tissue abnormality is noted. The
odontoid is within normal limits.
IMPRESSION: Multilevel degenerative changes with neural foraminal narrowing
similar to that seen on the prior exam.

## 2022-11-15 IMAGING — CT CT CHEST W/O CM
2 of 4 series · 15 of 36 positions shown, 18 images · non-contrast
Comparison: 05/16/2021

CLINICAL DATA: Persistent shortness of breath following COVID
infection 1 year ago, intermittent chest pain, initial encounter

EXAM:
CT CHEST WITHOUT CONTRAST
TECHNIQUE: Multidetector CT imaging of the chest was performed following the
standard protocol without IV contrast.

[Series 2: thorax · axial · 0.73mm/px · z∈[+1338,+1650]mm · 12 of 186 slices shown, 15 images]
[im 15/186  mediastinal]
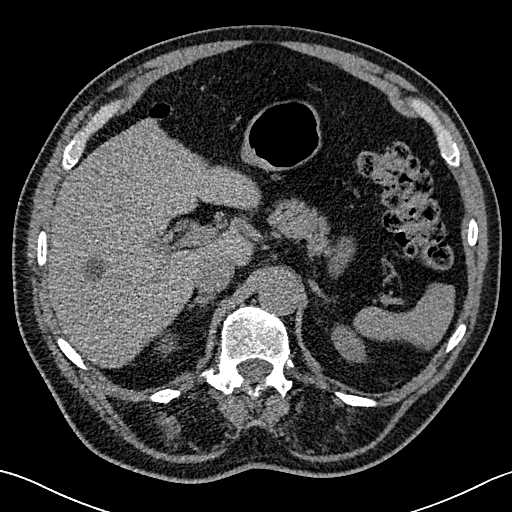
[im 15/186  lung]
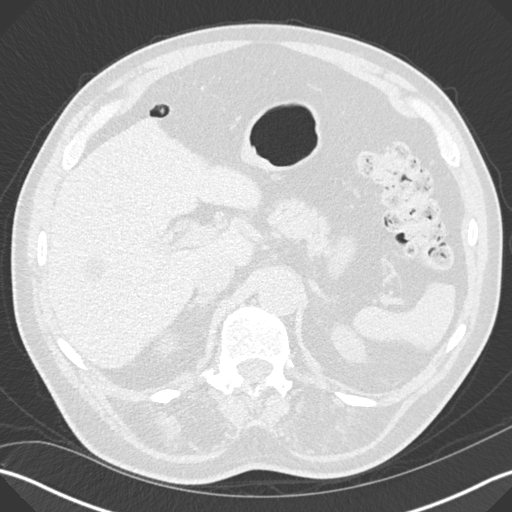
[im 29/186  lung]
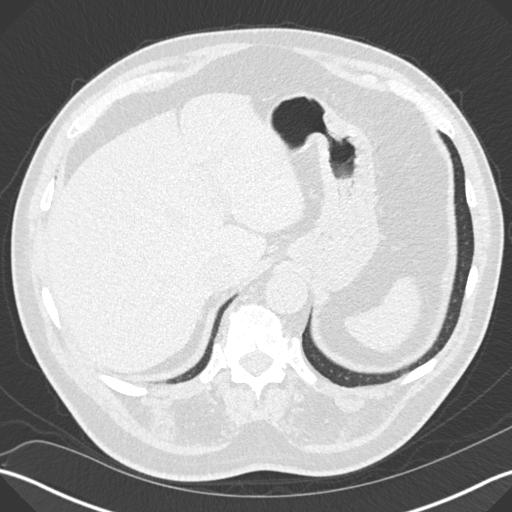
[im 43/186  lung]
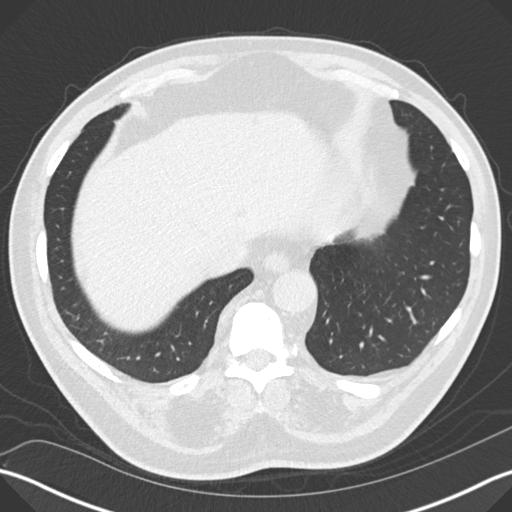
[im 57/186  lung]
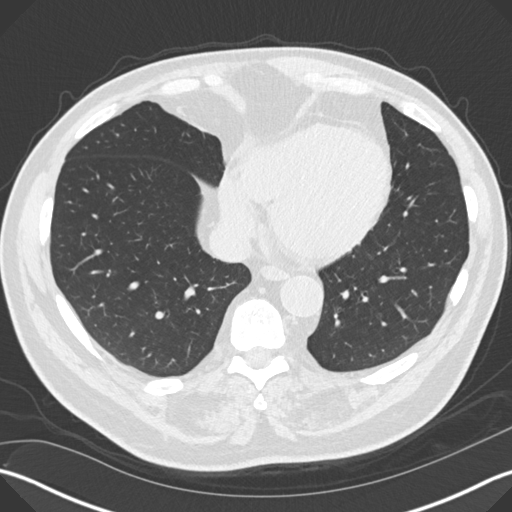
[im 72/186  mediastinal]
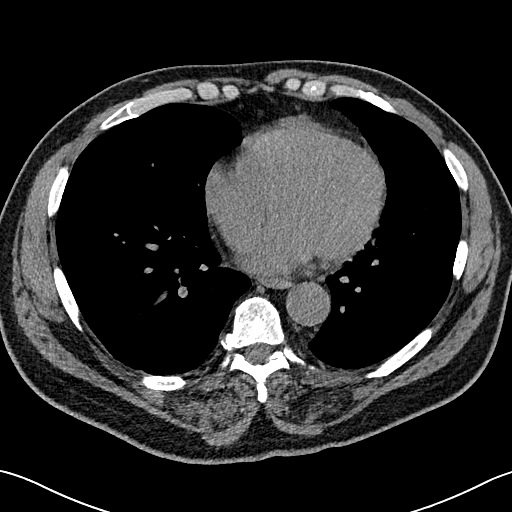
[im 72/186  lung]
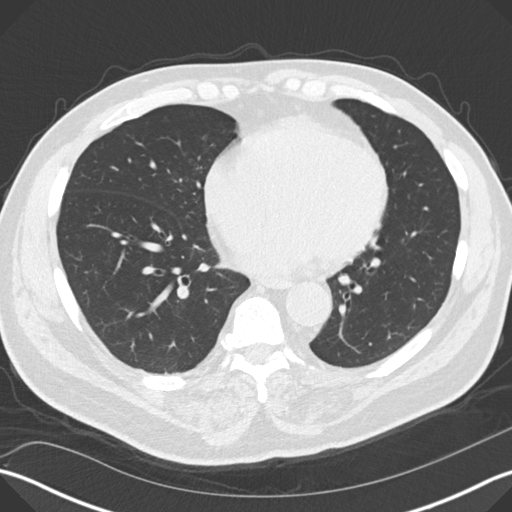
[im 86/186  lung]
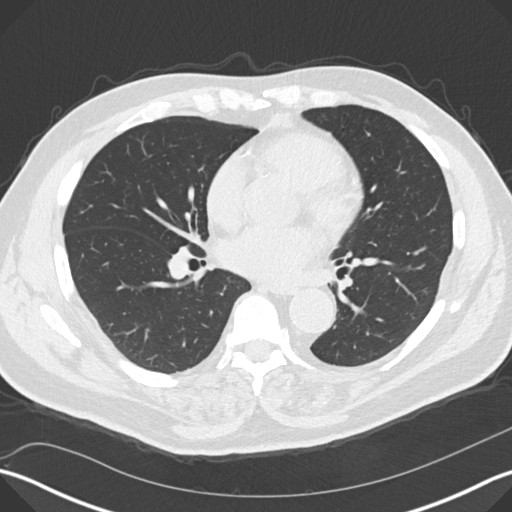
[im 100/186  lung]
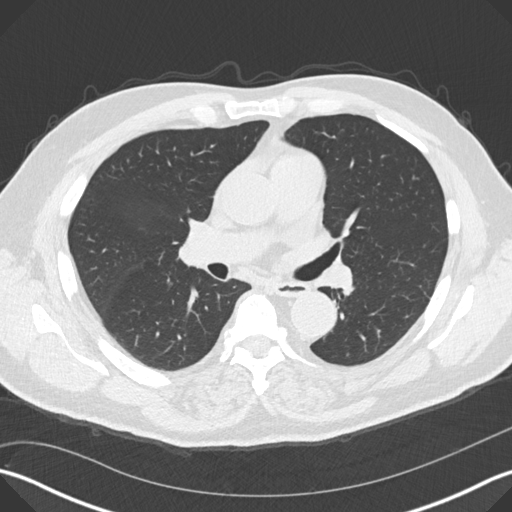
[im 114/186  lung]
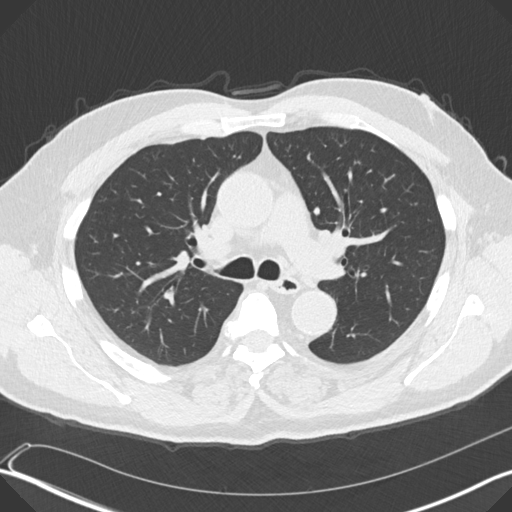
[im 129/186  mediastinal]
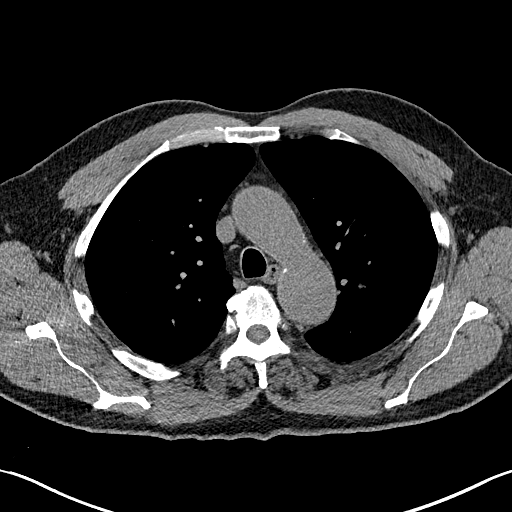
[im 129/186  lung]
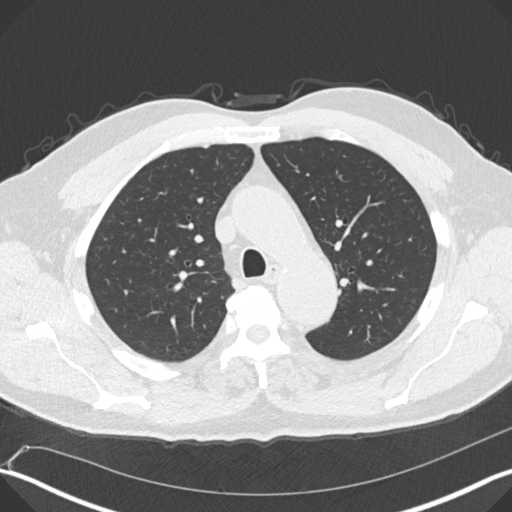
[im 143/186  lung]
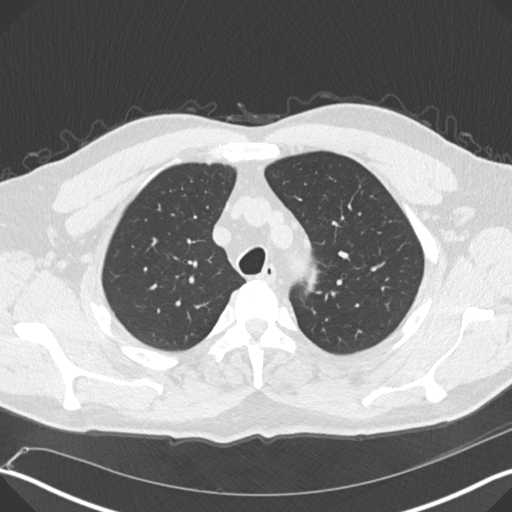
[im 157/186  lung]
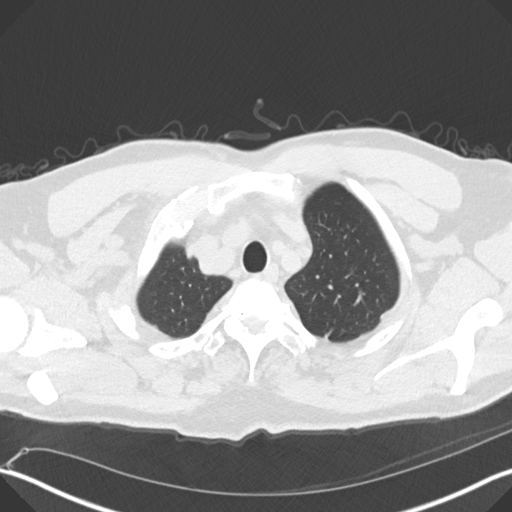
[im 171/186  lung]
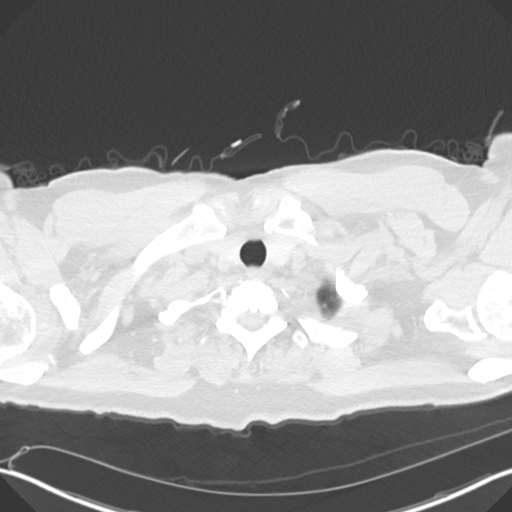

[Series 6: coronal · coronal · 0.73mm/px · 3 of 151 slices shown]
[im 31/151  lung]
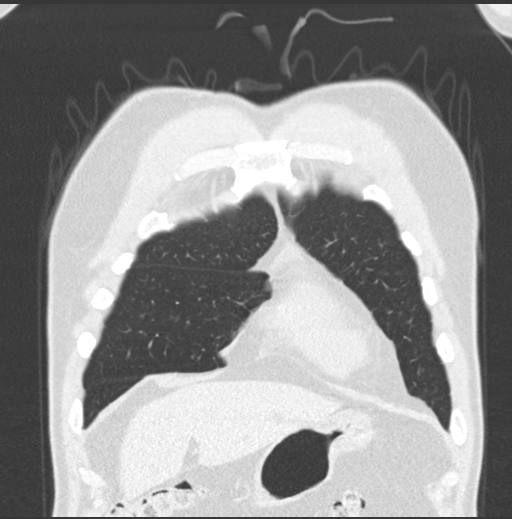
[im 61/151  lung]
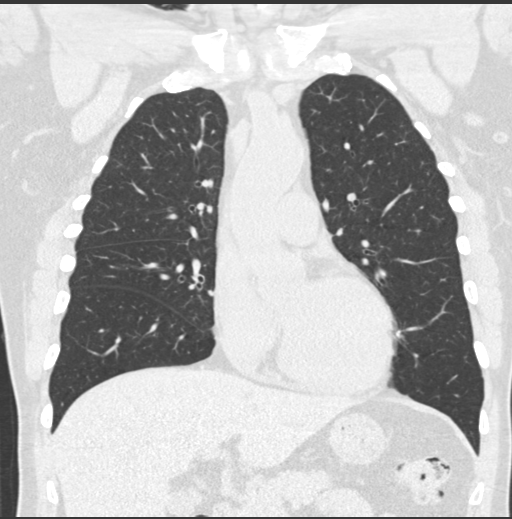
[im 91/151  lung]
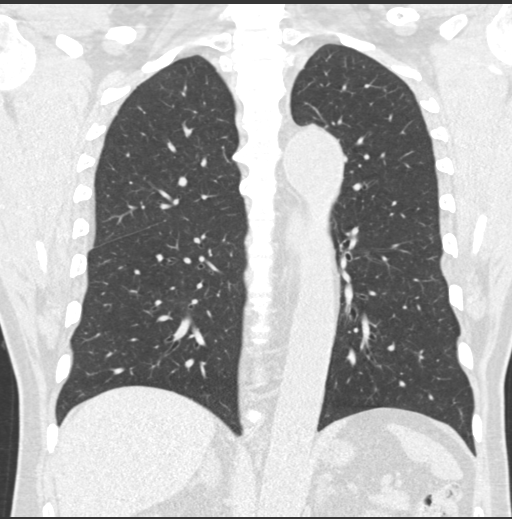

[15 of 36 positions shown; findings below may reference images not displayed]

FINDINGS: Cardiovascular: Limited due to lack of IV contrast. Atherosclerotic
calcifications are noted without aneurysmal dilatation of the aorta.
No cardiac enlargement is seen. Minimal coronary calcifications are
noted. Pulmonary artery is within normal limits as visualized.

Mediastinum/Nodes: Thoracic inlet is within normal limits. No
sizable hilar or mediastinal adenopathy is noted. The esophagus as
visualized is within normal limits.

Lungs/Pleura: The lungs are well aerated bilaterally. No focal
infiltrate or effusion is noted. No parenchymal nodules are seen.

Upper Abdomen: Visualized upper abdomen demonstrates a 2.2 cyst
within the right hepatic lobe stable in appearance from prior exam
from 7974. No other focal abnormality in the upper abdomen is noted.

Musculoskeletal: Degenerative changes of the thoracic spine are
noted. No rib abnormality is seen. No compression deformity is
noted.
IMPRESSION: Right hepatic cyst stable from 7974.

No acute abnormality is noted within the chest to correspond with
the patient's given clinical history.

Aortic Atherosclerosis ([id number]).

## 2022-11-15 NOTE — Assessment & Plan Note (Signed)
Unable to take statin therapy secondary to increased LFTs. Currently on Zetia for hyperlipidemia Check fasting lipids

## 2022-11-15 NOTE — Assessment & Plan Note (Signed)
Chronic.  Euvolemic on exam.  Asymptomatic. Continue GDMT Continue to follow-up with cardiology as scheduled.

## 2022-11-15 NOTE — Assessment & Plan Note (Signed)
Chronic.  Controlled with gabapentin 900 mg at night Check A1c and B12

## 2022-11-15 NOTE — Assessment & Plan Note (Signed)
Chronic.  Well-controlled on current medications. Continue Coreg 6.25 mg twice daily Continue Aldactone 25 mg daily Follow-up with cardiology as scheduled.

## 2022-11-15 NOTE — Assessment & Plan Note (Signed)
Chronic.  Unable to take statin secondary to elevated muscle enzymes as documented. Currently on Zetia 10 mg daily Check fasting lipids

## 2022-11-15 NOTE — Assessment & Plan Note (Signed)
History of a flutter on Eliquis Rate controlled Taking carvedilol 6.25 mg twice daily Follows with cardiology

## 2022-11-15 NOTE — Assessment & Plan Note (Signed)
Chronic.  ICD implant.  Follows with cardiology.

## 2022-11-21 NOTE — Patient Instructions (Signed)
Health Maintenance, Male Adopting a healthy lifestyle and getting preventive care are important in promoting health and wellness. Ask your health care provider about: The right schedule for you to have regular tests and exams. Things you can do on your own to prevent diseases and keep yourself healthy. What should I know about diet, weight, and exercise? Eat a healthy diet  Eat a diet that includes plenty of vegetables, fruits, low-fat dairy products, and lean protein. Do not eat a lot of foods that are high in solid fats, added sugars, or sodium. Maintain a healthy weight Body mass index (BMI) is a measurement that can be used to identify possible weight problems. It estimates body fat based on height and weight. Your health care provider can help determine your BMI and help you achieve or maintain a healthy weight. Get regular exercise Get regular exercise. This is one of the most important things you can do for your health. Most adults should: Exercise for at least 150 minutes each week. The exercise should increase your heart rate and make you sweat (moderate-intensity exercise). Do strengthening exercises at least twice a week. This is in addition to the moderate-intensity exercise. Spend less time sitting. Even light physical activity can be beneficial. Watch cholesterol and blood lipids Have your blood tested for lipids and cholesterol at 73 years of age, then have this test every 5 years. You may need to have your cholesterol levels checked more often if: Your lipid or cholesterol levels are high. You are older than 73 years of age. You are at high risk for heart disease. What should I know about cancer screening? Many types of cancers can be detected early and may often be prevented. Depending on your health history and family history, you may need to have cancer screening at various ages. This may include screening for: Colorectal cancer. Prostate cancer. Skin cancer. Lung  cancer. What should I know about heart disease, diabetes, and high blood pressure? Blood pressure and heart disease High blood pressure causes heart disease and increases the risk of stroke. This is more likely to develop in people who have high blood pressure readings or are overweight. Talk with your health care provider about your target blood pressure readings. Have your blood pressure checked: Every 3-5 years if you are 18-39 years of age. Every year if you are 40 years old or older. If you are between the ages of 65 and 75 and are a current or former smoker, ask your health care provider if you should have a one-time screening for abdominal aortic aneurysm (AAA). Diabetes Have regular diabetes screenings. This checks your fasting blood sugar level. Have the screening done: Once every three years after age 45 if you are at a normal weight and have a low risk for diabetes. More often and at a younger age if you are overweight or have a high risk for diabetes. What should I know about preventing infection? Hepatitis B If you have a higher risk for hepatitis B, you should be screened for this virus. Talk with your health care provider to find out if you are at risk for hepatitis B infection. Hepatitis C Blood testing is recommended for: Everyone born from 1945 through 1965. Anyone with known risk factors for hepatitis C. Sexually transmitted infections (STIs) You should be screened each year for STIs, including gonorrhea and chlamydia, if: You are sexually active and are younger than 73 years of age. You are older than 73 years of age and your   health care provider tells you that you are at risk for this type of infection. Your sexual activity has changed since you were last screened, and you are at increased risk for chlamydia or gonorrhea. Ask your health care provider if you are at risk. Ask your health care provider about whether you are at high risk for HIV. Your health care provider  may recommend a prescription medicine to help prevent HIV infection. If you choose to take medicine to prevent HIV, you should first get tested for HIV. You should then be tested every 3 months for as long as you are taking the medicine. Follow these instructions at home: Alcohol use Do not drink alcohol if your health care provider tells you not to drink. If you drink alcohol: Limit how much you have to 0-2 drinks a day. Know how much alcohol is in your drink. In the [place] one drink equals one 12 oz bottle of beer (355 mL), one 5 oz glass of wine (148 mL), or one 1 oz glass of hard liquor (44 mL). Lifestyle Do not use any products that contain nicotine or tobacco. These products include cigarettes, chewing tobacco, and vaping devices, such as e-cigarettes. If you need help quitting, ask your health care provider. Do not use street drugs. Do not share needles. Ask your health care provider for help if you need support or information about quitting drugs. General instructions Schedule regular health, dental, and eye exams. Stay current with your vaccines. Tell your health care provider if: You often feel depressed. You have ever been abused or do not feel safe at home. Summary Adopting a healthy lifestyle and getting preventive care are important in promoting health and wellness. Follow your health care provider's instructions about healthy diet, exercising, and getting tested or screened for diseases. Follow your health care provider's instructions on monitoring your cholesterol and blood pressure. This information is not intended to replace advice given to you by your health care provider. Make sure you discuss any questions you have with your health care provider. Document Revised: 11/10/2020 Document Reviewed: 11/10/2020 Elsevier Patient Education  2023 Elsevier Inc.  

## 2022-11-21 NOTE — Progress Notes (Unsigned)
I connected with  [name] on 11/22/2022 by a audio enabled telemedicine application and verified that I am speaking with the correct person using two identifiers.  Patient Location: Home  Provider Location: Home Office  I discussed the limitations of evaluation and management by telemedicine. The patient expressed understanding and agreed to proceed.   Subjective:   John Keith is a 72 y.o. male who presents for Medicare Annual/Subsequent preventive examination.  Review of Systems    Per HPI unless specifically indicated below. Cardiac Risk Factors include: advanced age (>42men, >60 women);male gender, Essential Hypertension, and Hyperlipidemia.           Objective:       11/10/2022    9:00 AM 10/29/2022    9:07 AM 10/27/2022    9:15 AM  Vitals with BMI  Height  6\' 9"    Weight 234 lbs 241 lbs 10 oz 237 lbs  BMI  25.9   Systolic 120 118 88  Diastolic 58 70 54  Pulse 62 65 54    Today's Vitals   11/22/22 0936  PainSc: 0-No pain   There is no height or weight on file to calculate BMI.     11/22/2022    9:40 AM 12/08/2021    2:46 PM 10/19/2021    8:45 AM 08/12/2021    8:24 AM 08/11/2020    8:48 AM 08/09/2019    8:46 AM 11/01/2016    8:45 AM  Advanced Directives  Does Patient Have a Medical Advance Directive? No No No No No No No  Would patient like information on creating a medical advance directive? No - Patient declined No - Patient declined No - Patient declined No - Patient declined No - Patient declined No - Patient declined No - Patient declined    Current Medications (verified) Outpatient Encounter Medications as of 11/22/2022  Medication Sig   amiodarone (PACERONE) 200 MG tablet TAKE 1 TABLET(200 MG) BY MOUTH DAILY   apixaban (ELIQUIS) 5 MG TABS tablet Take 1 tablet (5 mg total) by mouth 2 (two) times daily.   calcium carbonate (OS-CAL - DOSED IN MG OF ELEMENTAL CALCIUM) 1250 (500 Ca) MG tablet Take 1 tablet (500 mg of elemental calcium total) by mouth  daily with breakfast.   carvedilol (COREG) 6.25 MG tablet Take 1 tablet (6.25 mg total) by mouth 2 (two) times daily with a meal.   Cholecalciferol (VITAMIN D-3) 125 MCG (5000 UT) TABS Take 1 tablet by mouth daily.   ezetimibe (ZETIA) 10 MG tablet Take 1 tablet (10 mg total) by mouth daily.   FARXIGA 10 MG TABS tablet Take 1 tablet (10 mg total) by mouth daily.   ferrous sulfate 325 (65 FE) MG tablet Take 325 mg by mouth daily with breakfast.   folic acid (FOLVITE) 1 MG tablet Take 1 tablet (1 mg total) by mouth daily.   gabapentin (NEURONTIN) 300 MG capsule Take 900 mg by mouth daily.   methotrexate (RHEUMATREX) 2.5 MG tablet Take 20 mg (8 tablets) every Wednesday. PROTECT FROM LIGHT AS DIRECTED   Multiple Vitamin (MULTIVITAMIN) tablet Take 1 tablet by mouth daily.   mupirocin ointment (BACTROBAN) 2 %    pantoprazole (PROTONIX) 40 MG tablet Take 1 tablet (40 mg total) by mouth daily. 30 min before food   sacubitril-valsartan (ENTRESTO) 24-26 MG Take 1 tablet by mouth 2 (two) times daily.   spironolactone (ALDACTONE) 25 MG tablet TAKE 1 TABLET(25 MG) BY MOUTH DAILY   No facility-administered encounter medications  on file as of 11/22/2022.    Allergies (verified) Crestor [rosuvastatin calcium] and Lipitor [atorvastatin calcium]   History: Past Medical History:  Diagnosis Date   Allergy    Arthritis    Atrial flutter (HCC)    Carpal tunnel syndrome on left    Chest pain of uncertain etiology 10/24/2019   COVID-19    07/07/19   DDD (degenerative disc disease), thoracolumbar    multilevel   Degenerative joint disease of left shoulder    02/2011    Diverticulosis    left colon (2008)    ED (erectile dysfunction)    02/2011    GERD (gastroesophageal reflux disease)    H. pylori infection    Hyperlipidemia    Hypertension    Insomnia    Lactose intolerance    Low back pain    PVC (premature ventricular contraction) 10/24/2019   Shingles    07/2018   Smoking    smoking since age  43 y.o   Toe fracture, left    4th in 2014   Vitamin D deficiency    Past Surgical History:  Procedure Laterality Date   APPENDECTOMY     BACK SURGERY     x 2 10991 and 1995 in [place]    CARDIAC CATHETERIZATION     1999 nl   CARDIOVERSION N/A 10/19/2021   Procedure: CARDIOVERSION;  Surgeon: Christell Constant, MD;  Location: [facility]COPY;  Service: Cardiovascular;  Laterality: N/A;   CARPAL TUNNEL RELEASE Left    COLONOSCOPY     2008 diverticulosis   ESOPHAGOGASTRODUODENOSCOPY     h/o +h. pylori   ICD IMPLANT N/A 12/10/2021   Procedure: ICD IMPLANT;  Surgeon: Regan Lemming, MD;  Location: [facility] INVASIVE CV LAB;  Service: Cardiovascular;  Laterality: N/A;   LEFT HEART CATH AND CORONARY ANGIOGRAPHY N/A 12/08/2021   Procedure: LEFT HEART CATH AND CORONARY ANGIOGRAPHY;  Surgeon: Corky Crafts, MD;  [facility] INVASIVE CV LAB;  Service: Cardiovascular;  Laterality: N/A;   OTHER SURGICAL HISTORY     heart catherization 1999 normal    SPINE SURGERY     x 2    Family History  Problem Relation Age of Onset   Heart disease Mother        age 72    Throat cancer Father 29   Cancer Father        throat    Cancer Maternal Uncle        uncle ?maternal or paternal died colon cancer in his 22s    Diabetes Maternal Aunt    Heart attack Maternal Grandmother    Social History   Socioeconomic History   Marital status: Married    Spouse name: Gwendolyn   Number of children: 2   Years of education: Not on file   Highest education level: High school graduate  Occupational History   Occupation: Retired    Comment: Semi  Tobacco Use   Smoking status: Former    Packs/day: .5    Types: Cigarettes    Start date: 07/06/1971    Quit date: 06/26/2020    Years since quitting: 2.4   Smokeless tobacco: Never   Tobacco comments:    Former smoker 09/24/21  Vaping Use   Vaping Use: Never used  Substance and Sexual Activity   Alcohol use: No    Alcohol/week: 0.0 standard drinks of  alcohol   Drug use: No   Sexual activity: Not on file  Other Topics Concern  Not on file  Social History Narrative   Married    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    On child    1 son, 1 daughter   Machine op   Owns guns    Wears seat belt, safe in relationship    Smoker    Retired 06/27/2019   Social Determinants of Health   Financial Resource Strain: Low Risk  (11/22/2022)   Overall Financial Resource Strain (CARDIA)    Difficulty of Paying Living Expenses: Not hard at all  Food Insecurity: No Food Insecurity (11/22/2022)   Hunger Vital Sign    Worried About Running Out of Food in the Last Year: Never true    Ran Out of Food in the Last Year: Never true  Transportation Needs: No Transportation Needs (11/22/2022)   PRAPARE - Administrator, Civil Service (Medical): No    Lack of Transportation (Non-Medical): No  Physical Activity: Sufficiently Active (11/22/2022)   Exercise Vital Sign    Days of Exercise per Week: 3 days    Minutes of Exercise per Session: 60 min  Stress: No Stress Concern Present (11/22/2022)   [employer] of Occupational Health - Occupational Stress Questionnaire    Feeling of Stress : Not at all  Social Connections: Socially Integrated (11/22/2022)   Social Connection and Isolation Panel [NHANES]    Frequency of Communication with Friends and Family: Once a week    Frequency of Social Gatherings with Friends and Family: More than three times a week    Attends Religious Services: More than 4 times per year    Active Member of [employer] or Organizations: Yes    Attends Banker Meetings: Never    Marital Status: Married    Tobacco Counseling Counseling given: No Tobacco comments: Former smoker 09/24/21   Clinical Intake:  Pre-visit preparation completed: No  Pain : No/denies pain Pain Score: 0-No pain     Nutritional Status: BMI > 30  Obese Nutritional Risks: None Diabetes: No  How often do you need to have someone help you when you  read instructions, pamphlets, or other written materials from your doctor or pharmacy?: 1 - Never  Diabetic?No   Interpreter Needed?: No  Information entered by :: Laurel Dimmer, CMA   Activities of Daily Living    11/22/2022    9:35 AM 12/08/2021    2:46 PM  In your present state of health, do you have any difficulty performing the following activities:  Hearing? 0 0  Vision? 1 0  Comment Wear glass, [employer] [employer]   Difficulty concentrating or making decisions? 0 0  Walking or climbing stairs? 0 0  Dressing or bathing? 0 0  Doing errands, shopping? 0     Patient Care Team: Dana Allan, MD as PCP - General (Family Medicine) Pincus Sanes, MD as Consulting Physician (Internal Medicine) Chilton Si, MD as Attending Physician (Cardiology) Regan Lemming, MD as Consulting Physician (Cardiology)  Indicate any recent Medical Services you may have received from other than [facility] providers in the past year (date may be approximate).     Assessment:   This is a routine wellness examination for John Keith.   Hearing/Vision screen Denies any hearing issues. Denies any change to her vision. Wear glasses. Annual Eye Exam.   Dietary issues and exercise activities discussed: Current Exercise Habits: Structured exercise class, Type of exercise: Other - see comments (Swim), Time (Minutes): 40, Frequency (Times/Week): 3, Weekly Exercise (Minutes/Week): 120, Intensity: Moderate, Exercise  limited by: None identified   Goals Addressed   None    Depression Screen    11/22/2022    9:34 AM 01/28/2022    1:12 PM 12/15/2021   10:53 AM 08/27/2021   11:39 AM 08/12/2021    8:25 AM 01/20/2021    3:05 PM 08/11/2020    8:49 AM  PHQ 2/9 Scores  PHQ - 2 Score 0 0 0 1 0 3 0    Fall Risk    11/22/2022    9:35 AM 01/28/2022    1:12 PM 12/15/2021   10:52 AM 08/27/2021   11:39 AM 08/12/2021    8:25 AM  Fall Risk   Falls in the past year? 0 1 1 0 0  Number falls in past yr: 0 1  0 0 0  Injury with Fall? 0 0 0 0   Risk for fall due to : No Fall Risks  History of fall(s) No Fall Risks   Follow up Falls evaluation completed  Falls evaluation completed Falls evaluation completed Falls evaluation completed    FALL RISK PREVENTION PERTAINING TO THE HOME:  Any stairs in or around the home? No  If so, are there any without handrails? No  Home free of loose throw rugs in walkways, pet beds, electrical cords, etc? Yes  Adequate lighting in your home to reduce risk of falls? Yes   ASSISTIVE DEVICES UTILIZED TO PREVENT FALLS:  Life alert? No  Use of a cane, walker or w/c? No  Grab bars in the bathroom? No  Shower chair or bench in shower? No  Elevated toilet seat or a handicapped toilet? No   TIMED UP AND GO:  Was the test performed?Unable to perform, virtual appointment   Cognitive Function:        11/22/2022    9:37 AM 08/12/2021    8:40 AM 08/11/2020    8:52 AM 08/09/2019    8:54 AM  6CIT Screen  What Year? 0 points 0 points 0 points 0 points  What month? 0 points 0 points 0 points 0 points  What time? 0 points 0 points 0 points 0 points  Count back from 20 0 points 0 points 0 points 0 points  Months in reverse 0 points 0 points 0 points 0 points  Repeat phrase 2 points 0 points  0 points  Total Score 2 points 0 points  0 points    Immunizations Immunization History  Administered Date(s) Administered   Influenza, High Dose Seasonal PF 03/28/2019   Influenza-Unspecified 05/07/2019, 04/25/2020, 03/05/2021   PFIZER(Purple Top)SARS-COV-2 Vaccination 10/04/2019, 10/09/2019, 05/08/2020   Pfizer Covid-19 Vaccine Bivalent Booster 34yrs & up 04/04/2021   Pneumococcal Conjugate-13 06/03/2016   Pneumococcal Polysaccharide-23 02/06/2015   Tdap 06/15/2013   Zoster Recombinat (Shingrix) 06/07/2019    TDAP status: Up to date  Flu Vaccine status: Up to date  Pneumococcal vaccine status: Up to date  Covid-19 vaccine status: Information provided on how to obtain  vaccines.   Qualifies for Shingles Vaccine? Yes   Zostavax completed No   Shingrix Completed?: No.    Education has been provided regarding the importance of this vaccine. Patient has been advised to call insurance company to determine out of pocket expense if they have not yet received this vaccine. Advised may also receive vaccine at local pharmacy or Health Dept. Verbalized acceptance and understanding.  Screening Tests Health Maintenance  Topic Date Due   COVID-19 Vaccine (5 - 2023-24 season) 03/05/2022   Zoster Vaccines- Shingrix (2  of 2) 01/28/2023 (Originally 08/02/2019)   INFLUENZA VACCINE  02/03/2023   DTaP/Tdap/Td (2 - Td or Tdap) 06/16/2023   COLONOSCOPY (Pts 45-71yrs Insurance coverage will need to be confirmed)  12/18/2026   Pneumonia Vaccine 29+ Years old  Completed   Hepatitis C Screening  Completed   HPV VACCINES  Aged Out    Health Maintenance  Health Maintenance Due  Topic Date Due   COVID-19 Vaccine (5 - 2023-24 season) 03/05/2022    Colorectal cancer screening: Type of screening: Colonoscopy. Completed 12/17/2016. Repeat every 10 years  Lung Cancer Screening: (Low Dose CT Chest recommended if Age 66-80 years, 30 pack-year currently smoking OR have quit w/in 15years.) does not qualify.   Lung Cancer Screening Referral: not applicable   Additional Screening:  Hepatitis C Screening: does qualify; Completed 06/03/2016  Vision Screening: Recommended annual ophthalmology exams for early detection of glaucoma and other disorders of the eye. Is the patient up to date with their annual eye exam?  Yes  Who is the provider or what is the name of the office in which the patient attends annual eye exams? [facility]  If pt is not established with a provider, would they like to be referred to a provider to establish care? No .   Dental Screening: Recommended annual dental exams for proper oral hygiene  Community Resource Referral / Chronic Care  Management: CRR required this visit?  No   CCM required this visit?  No      Plan:     I have personally reviewed and noted the following in the patient's chart:   Medical and social history Use of alcohol, tobacco or illicit drugs  Current medications and supplements including opioid prescriptions. Patient is not currently taking opioid prescriptions. Functional ability and status Nutritional status Physical activity Advanced directives List of other physicians Hospitalizations, surgeries, and ER visits in previous 12 months Vitals Screenings to include cognitive, depression, and falls Referrals and appointments  In addition, I have reviewed and discussed with patient certain preventive protocols, quality metrics, and best practice recommendations. A written personalized care plan for preventive services as well as general preventive health recommendations were provided to patient.    John Keith , Thank you for taking time to come for your Medicare Wellness Visit. I appreciate your ongoing commitment to your health goals. Please review the following plan we discussed and let me know if I can assist you in the future.   These are the goals we discussed:  Goals       Blood Pressure < 140/90      I would like to lose a little weight (pt-stated)      Walk more for exercise.        This is a list of the screening recommended for you and due dates:  Health Maintenance  Topic Date Due   COVID-19 Vaccine (5 - 2023-24 season) 03/05/2022   Zoster (Shingles) Vaccine (2 of 2) 01/28/2023*   Flu Shot  02/03/2023   DTaP/Tdap/Td vaccine (2 - Td or Tdap) 06/16/2023   Colon Cancer Screening  12/18/2026   Pneumonia Vaccine  Completed   Hepatitis C Screening: USPSTF Recommendation to screen - Ages 63-79 yo.  Completed   HPV Vaccine  Aged Out  *Topic was postponed. The date shown is not the original due date.     Lonna Cobb, CMA   11/22/2022  Nurse Notes: Approximately  30 minute Non-Face -To-Face Medicare Wellness Visit

## 2022-11-22 ENCOUNTER — Ambulatory Visit (INDEPENDENT_AMBULATORY_CARE_PROVIDER_SITE_OTHER): Payer: PPO

## 2022-11-22 DIAGNOSIS — Z0000 Encounter for general adult medical examination without abnormal findings: Secondary | ICD-10-CM | POA: Diagnosis not present

## 2022-11-24 ENCOUNTER — Other Ambulatory Visit (HOSPITAL_COMMUNITY): Payer: Self-pay

## 2022-11-24 NOTE — Progress Notes (Signed)
Paramedicine Encounter    Patient ID: John Keith, male    DOB: 10-May-1951, 72 y.o.   MRN: [id number]   Complaints- none- reports feeling well.   Assessment- CAOX4 warm and dry reporting to be feeling well with no complaints today. No lower leg swelling. Lungs clear. No lower leg edema or swelling. Vitals within normal range.   Compliance with meds- No missed doses in the last two weeks.   Pill box filled- for two weeks   Refills needed- folic acid   Meds changes since last visit- none     Social changes- none   Arrived for home visit for John Keith who reports to be feeling well with no complaints today. He reports he did have one episode of dizziness before taking his medications last week but he took his blood pressure and it was >120 systolic. He denied any chest pain or shortness of breath at that time. He has been keeping a log of his blood pressures and they seem to be within normal range.   He states he has been swimming at the [facility] with his wife twice a week and denied any dizziness, shortness of breath or chest pain during swimming, before or after.   He has not heard from [facility] PET Scan scheduler as of yet. I will touch base with HF clinic about same.   I reviewed meds and pill box for two weeks- folic acid refill needed. We reviewed upcoming appointments and confirmed same. Home visit complete. I will see Talon in two weeks.    BP 98/60   Pulse (!) 57   Resp 16   Wt 233 lb 12.8 oz (106.1 kg)   SpO2 94%   BMI 25.05 kg/m  Weight yesterday-- 234lbs  Last visit weight-- 234lbs    [name], EMT-Paramedic [phone]  ACTION: Home visit completed    Patient Care Team: Dana Allan, MD as PCP - General (Family Medicine) Pincus Sanes, MD as Consulting Physician (Internal Medicine) Chilton Si, MD as Attending Physician (Cardiology) Regan Lemming, MD as Consulting Physician (Cardiology)  Patient Active Problem List   Diagnosis Date Noted    Adverse reaction to statin medication 11/15/2022   Abnormal glucose 11/15/2022   Prostate cancer screening 11/15/2022   Chronic systolic heart failure (HCC) 12/15/2021   Chronic combined systolic and diastolic CHF (congestive heart failure) (HCC) 12/15/2021   Nasal sore 12/15/2021   Syncope 12/07/2021   Ventricular tachycardia (HCC) 12/07/2021   Typical atrial flutter (HCC) 09/24/2021   Secondary hypercoagulable state (HCC) 09/24/2021   Abnormal x-ray of neck 06/16/2021   Abnormal MRI, lumbar spine 10/23/2020   Chronic low back pain 10/23/2020   Neuropathy 10/23/2020   Coronary artery disease of native artery of native heart with stable angina pectoris (HCC) 10/23/2020   Impingement syndrome of left shoulder region 08/22/2020   Pain in joint of left shoulder 07/24/2020   Chronic left shoulder pain 07/11/2020   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   Anemia 12/13/2019   Chest pain of uncertain etiology 10/24/2019   PVC (premature ventricular contraction) 10/24/2019   Gastric ulcer without hemorrhage or perforation 09/28/2019   COVID-19 virus detected 07/12/2019   Elevated troponin 07/07/2019   Elevated serum protein level 07/07/2019   Postherpetic neuralgia 08/17/2018   Benign prostatic hyperplasia 08/01/2018   Hip pain 06/29/2018   Aortic atherosclerosis (HCC) 09/22/2016   Fatigue 06/03/2016   Lower urinary tract symptoms (LUTS) 06/03/2016   Constipation 03/10/2016   Lumbar radiculopathy 02/20/2016   Encounter  for immunization 11/26/2015   Insomnia 06/10/2015   External hemorrhoid 03/07/2015   Health care maintenance 01/09/2014   Carpal tunnel syndrome of left wrist 01/03/2013   Hyperlipidemia 11/09/2012   Vitamin D deficiency 11/09/2012   Essential hypertension 11/06/2012   Tobacco abuse 11/06/2012   Seasonal allergies 11/06/2012   Erectile dysfunction 11/06/2012   GERD (gastroesophageal reflux disease) 11/06/2012   DDD (degenerative disc disease), thoracolumbar 11/06/2012    Lactose intolerance 11/06/2012   Diverticulosis of colon without hemorrhage 11/06/2012    Current Outpatient Medications:    amiodarone (PACERONE) 200 MG tablet, TAKE 1 TABLET(200 MG) BY MOUTH DAILY, Disp: 30 tablet, Rfl: 5   apixaban (ELIQUIS) 5 MG TABS tablet, Take 1 tablet (5 mg total) by mouth 2 (two) times daily., Disp: 60 tablet, Rfl: 3   calcium carbonate (OS-CAL - DOSED IN MG OF ELEMENTAL CALCIUM) 1250 (500 Ca) MG tablet, Take 1 tablet (500 mg of elemental calcium total) by mouth daily with breakfast., Disp: 30 tablet, Rfl: 11   carvedilol (COREG) 6.25 MG tablet, Take 1 tablet (6.25 mg total) by mouth 2 (two) times daily with a meal., Disp: 60 tablet, Rfl: 6   Cholecalciferol (VITAMIN D-3) 125 MCG (5000 UT) TABS, Take 1 tablet by mouth daily., Disp: 30 tablet, Rfl: 11   ezetimibe (ZETIA) 10 MG tablet, Take 1 tablet (10 mg total) by mouth daily., Disp: 90 tablet, Rfl: 3   FARXIGA 10 MG TABS tablet, Take 1 tablet (10 mg total) by mouth daily., Disp: 90 tablet, Rfl: 3   ferrous sulfate 325 (65 FE) MG tablet, Take 325 mg by mouth daily with breakfast., Disp: , Rfl:    folic acid (FOLVITE) 1 MG tablet, Take 1 tablet (1 mg total) by mouth daily., Disp: 30 tablet, Rfl: 11   gabapentin (NEURONTIN) 300 MG capsule, Take 900 mg by mouth daily., Disp: , Rfl:    methotrexate (RHEUMATREX) 2.5 MG tablet, Take 20 mg (8 tablets) every Wednesday. PROTECT FROM LIGHT AS DIRECTED, Disp: 150 tablet, Rfl: 0   Multiple Vitamin (MULTIVITAMIN) tablet, Take 1 tablet by mouth daily., Disp: , Rfl:    mupirocin ointment (BACTROBAN) 2 %, , Disp: , Rfl:    pantoprazole (PROTONIX) 40 MG tablet, Take 1 tablet (40 mg total) by mouth daily. 30 min before food, Disp: 90 tablet, Rfl: 3   sacubitril-valsartan (ENTRESTO) 24-26 MG, Take 1 tablet by mouth 2 (two) times daily., Disp: 180 tablet, Rfl: 3   spironolactone (ALDACTONE) 25 MG tablet, TAKE 1 TABLET(25 MG) BY MOUTH DAILY, Disp: 90 tablet, Rfl: 0 Allergies  Allergen  Reactions   Crestor [Rosuvastatin Calcium] Other (See Comments)    Elevated muscle enzymes    Lipitor [Atorvastatin Calcium] Other (See Comments)    Elevated muscle enzymes      Social History   Socioeconomic History   Marital status: Married    Spouse name: Gwendolyn   Number of children: 2   Years of education: Not on file   Highest education level: High school graduate  Occupational History   Occupation: Retired    Comment: Semi  Tobacco Use   Smoking status: Former    Packs/day: .5    Types: Cigarettes    Start date: 07/06/1971    Quit date: 06/26/2020    Years since quitting: 2.4   Smokeless tobacco: Never   Tobacco comments:    Former smoker 09/24/21  Vaping Use   Vaping Use: Never used  Substance and Sexual Activity   Alcohol use: No  Alcohol/week: 0.0 standard drinks of alcohol   Drug use: No   Sexual activity: Not on file  Other Topics Concern   Not on file  Social History Narrative   Married    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    On child    1 son, 1 daughter   Machine op   Owns guns    Wears seat belt, safe in relationship    Smoker    Retired 06/27/2019   Social Determinants of Health   Financial Resource Strain: Low Risk  (11/22/2022)   Overall Financial Resource Strain (CARDIA)    Difficulty of Paying Living Expenses: Not hard at all  Food Insecurity: No Food Insecurity (11/22/2022)   Hunger Vital Sign    Worried About Running Out of Food in the Last Year: Never true    Ran Out of Food in the Last Year: Never true  Transportation Needs: No Transportation Needs (11/22/2022)   PRAPARE - Administrator, Civil Service (Medical): No    Lack of Transportation (Non-Medical): No  Physical Activity: Sufficiently Active (11/22/2022)   Exercise Vital Sign    Days of Exercise per Week: 3 days    Minutes of Exercise per Session: 60 min  Stress: No Stress Concern Present (11/22/2022)   [employer] of Occupational Health - Occupational Stress Questionnaire     Feeling of Stress : Not at all  Social Connections: Socially Integrated (11/22/2022)   Social Connection and Isolation Panel [NHANES]    Frequency of Communication with Friends and Family: Once a week    Frequency of Social Gatherings with Friends and Family: More than three times a week    Attends Religious Services: More than 4 times per year    Active Member of [employer] or Organizations: Yes    Attends Banker Meetings: Never    Marital Status: Married  Catering manager Violence: Not At Risk (11/22/2022)   Humiliation, Afraid, Rape, and Kick questionnaire    Fear of Current or Ex-Partner: No    Emotionally Abused: No    Physically Abused: No    Sexually Abused: No    Physical Exam      Future Appointments  Date Time Provider Department Center  12/10/2022  7:00 AM [facility]  01/19/2023  1:15 PM McCaughan, Dia D, DPM [facility]  01/31/2023  8:20 AM Dana Allan, MD [facility]  03/11/2023  7:00 AM [facility]ICE [facility]  11/23/2023  8:30 AM [facility] ANNUAL WELLNESS VISIT [facility]

## 2022-11-25 ENCOUNTER — Other Ambulatory Visit (HOSPITAL_COMMUNITY): Payer: Self-pay | Admitting: Physician Assistant

## 2022-11-25 ENCOUNTER — Other Ambulatory Visit (HOSPITAL_COMMUNITY): Payer: Self-pay

## 2022-11-25 DIAGNOSIS — I5022 Chronic systolic (congestive) heart failure: Secondary | ICD-10-CM

## 2022-11-25 DIAGNOSIS — D8685 Sarcoid myocarditis: Secondary | ICD-10-CM

## 2022-12-08 ENCOUNTER — Other Ambulatory Visit (HOSPITAL_COMMUNITY): Payer: Self-pay

## 2022-12-08 LAB — CUP PACEART REMOTE DEVICE CHECK
Battery Remaining Longevity: 132 mo
Battery Voltage: 3.04 V
Brady Statistic RV Percent Paced: 0.24 %
Date Time Interrogation Session: 20240605090350
HighPow Impedance: 65 Ohm
Implantable Lead Connection Status: 753985
Implantable Lead Implant Date: 20230608
Implantable Lead Location: 753860
Implantable Pulse Generator Implant Date: 20230608
Lead Channel Impedance Value: 323 Ohm
Lead Channel Impedance Value: 380 Ohm
Lead Channel Pacing Threshold Amplitude: 0.875 V
Lead Channel Pacing Threshold Pulse Width: 0.4 ms
Lead Channel Sensing Intrinsic Amplitude: 9.5 mV
Lead Channel Sensing Intrinsic Amplitude: 9.5 mV
Lead Channel Setting Pacing Amplitude: 2 V
Lead Channel Setting Pacing Pulse Width: 0.4 ms
Lead Channel Setting Sensing Sensitivity: 0.3 mV
Zone Setting Status: 755011
Zone Setting Status: 755011

## 2022-12-08 NOTE — Progress Notes (Unsigned)
Paramedicine Encounter    Patient ID: John Keith, male    DOB: 04/07/1951, 72 y.o.   MRN: [id number]   Patient Care Team: Dana Allan, MD as PCP - General (Family Medicine) Pincus Sanes, MD as Consulting Physician (Internal Medicine) Chilton Si, MD as Attending Physician (Cardiology) Regan Lemming, MD as Consulting Physician (Cardiology)  Patient Active Problem List   Diagnosis Date Noted   Adverse reaction to statin medication 11/15/2022   Abnormal glucose 11/15/2022   Prostate cancer screening 11/15/2022   Chronic systolic heart failure (HCC) 12/15/2021   Chronic combined systolic and diastolic CHF (congestive heart failure) (HCC) 12/15/2021   Nasal sore 12/15/2021   Syncope 12/07/2021   Ventricular tachycardia (HCC) 12/07/2021   Typical atrial flutter (HCC) 09/24/2021   Secondary hypercoagulable state (HCC) 09/24/2021   Abnormal x-ray of neck 06/16/2021   Abnormal MRI, lumbar spine 10/23/2020   Chronic low back pain 10/23/2020   Neuropathy 10/23/2020   Coronary artery disease of native artery of native heart with stable angina pectoris (HCC) 10/23/2020   Impingement syndrome of left shoulder region 08/22/2020   Pain in joint of left shoulder 07/24/2020   Chronic left shoulder pain 07/11/2020   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   Anemia 12/13/2019   Chest pain of uncertain etiology 10/24/2019   PVC (premature ventricular contraction) 10/24/2019   Gastric ulcer without hemorrhage or perforation 09/28/2019   COVID-19 virus detected 07/12/2019   Elevated troponin 07/07/2019   Elevated serum protein level 07/07/2019   Postherpetic neuralgia 08/17/2018   Benign prostatic hyperplasia 08/01/2018   Hip pain 06/29/2018   Aortic atherosclerosis (HCC) 09/22/2016   Fatigue 06/03/2016   Lower urinary tract symptoms (LUTS) 06/03/2016   Constipation 03/10/2016   Lumbar radiculopathy 02/20/2016   Encounter for immunization 11/26/2015   Insomnia 06/10/2015    External hemorrhoid 03/07/2015   Health care maintenance 01/09/2014   Carpal tunnel syndrome of left wrist 01/03/2013   Hyperlipidemia 11/09/2012   Vitamin D deficiency 11/09/2012   Essential hypertension 11/06/2012   Tobacco abuse 11/06/2012   Seasonal allergies 11/06/2012   Erectile dysfunction 11/06/2012   GERD (gastroesophageal reflux disease) 11/06/2012   DDD (degenerative disc disease), thoracolumbar 11/06/2012   Lactose intolerance 11/06/2012   Diverticulosis of colon without hemorrhage 11/06/2012    Current Outpatient Medications:    amiodarone (PACERONE) 200 MG tablet, TAKE 1 TABLET(200 MG) BY MOUTH DAILY, Disp: 30 tablet, Rfl: 5   apixaban (ELIQUIS) 5 MG TABS tablet, Take 1 tablet (5 mg total) by mouth 2 (two) times daily., Disp: 60 tablet, Rfl: 3   calcium carbonate (OS-CAL - DOSED IN MG OF ELEMENTAL CALCIUM) 1250 (500 Ca) MG tablet, Take 1 tablet (500 mg of elemental calcium total) by mouth daily with breakfast., Disp: 30 tablet, Rfl: 11   carvedilol (COREG) 6.25 MG tablet, Take 1 tablet (6.25 mg total) by mouth 2 (two) times daily with a meal., Disp: 60 tablet, Rfl: 6   Cholecalciferol (VITAMIN D-3) 125 MCG (5000 UT) TABS, Take 1 tablet by mouth daily., Disp: 30 tablet, Rfl: 11   ezetimibe (ZETIA) 10 MG tablet, Take 1 tablet (10 mg total) by mouth daily., Disp: 90 tablet, Rfl: 3   FARXIGA 10 MG TABS tablet, Take 1 tablet (10 mg total) by mouth daily., Disp: 90 tablet, Rfl: 3   ferrous sulfate 325 (65 FE) MG tablet, Take 325 mg by mouth daily with breakfast., Disp: , Rfl:    folic acid (FOLVITE) 1 MG tablet, TAKE 1 TABLET(1 MG) BY  MOUTH DAILY, Disp: 30 tablet, Rfl: 11   gabapentin (NEURONTIN) 300 MG capsule, Take 900 mg by mouth daily., Disp: , Rfl:    methotrexate (RHEUMATREX) 2.5 MG tablet, Take 20 mg (8 tablets) every Wednesday. PROTECT FROM LIGHT AS DIRECTED, Disp: 150 tablet, Rfl: 0   Multiple Vitamin (MULTIVITAMIN) tablet, Take 1 tablet by mouth daily., Disp: , Rfl:     mupirocin ointment (BACTROBAN) 2 %, , Disp: , Rfl:    pantoprazole (PROTONIX) 40 MG tablet, Take 1 tablet (40 mg total) by mouth daily. 30 min before food, Disp: 90 tablet, Rfl: 3   sacubitril-valsartan (ENTRESTO) 24-26 MG, Take 1 tablet by mouth 2 (two) times daily., Disp: 180 tablet, Rfl: 3   spironolactone (ALDACTONE) 25 MG tablet, TAKE 1 TABLET(25 MG) BY MOUTH DAILY, Disp: 90 tablet, Rfl: 0 Allergies  Allergen Reactions   Crestor [Rosuvastatin Calcium] Other (See Comments)    Elevated muscle enzymes    Lipitor [Atorvastatin Calcium] Other (See Comments)    Elevated muscle enzymes      Social History   Socioeconomic History   Marital status: Married    Spouse name: Gwendolyn   Number of children: 2   Years of education: Not on file   Highest education level: High school graduate  Occupational History   Occupation: Retired    Comment: Semi  Tobacco Use   Smoking status: Former    Packs/day: .5    Types: Cigarettes    Start date: 07/06/1971    Quit date: 06/26/2020    Years since quitting: 2.4   Smokeless tobacco: Never   Tobacco comments:    Former smoker 09/24/21  Vaping Use   Vaping Use: Never used  Substance and Sexual Activity   Alcohol use: No    Alcohol/week: 0.0 standard drinks of alcohol   Drug use: No   Sexual activity: Not on file  Other Topics Concern   Not on file  Social History Narrative   Married    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    On child    1 son, 1 daughter   Systems analyst op   Owns guns    Wears seat belt, safe in relationship    Smoker    Retired 06/27/2019   Social Determinants of Health   Financial Resource Strain: Low Risk  (11/22/2022)   Overall Financial Resource Strain (CARDIA)    Difficulty of Paying Living Expenses: Not hard at all  Food Insecurity: No Food Insecurity (11/22/2022)   Hunger Vital Sign    Worried About Running Out of Food in the Last Year: Never true    Ran Out of Food in the Last Year: Never true  Transportation Needs: No  Transportation Needs (11/22/2022)   PRAPARE - Administrator, Civil Service (Medical): No    Lack of Transportation (Non-Medical): No  Physical Activity: Sufficiently Active (11/22/2022)   Exercise Vital Sign    Days of Exercise per Week: 3 days    Minutes of Exercise per Session: 60 min  Stress: No Stress Concern Present (11/22/2022)   [employer] of Occupational Health - Occupational Stress Questionnaire    Feeling of Stress : Not at all  Social Connections: Socially Integrated (11/22/2022)   Social Connection and Isolation Panel [NHANES]    Frequency of Communication with Friends and Family: Once a week    Frequency of Social Gatherings with Friends and Family: More than three times a week    Attends Religious Services: More than 4 times per year  Active Member of Clubs or Organizations: Yes    Attends Banker Meetings: Never    Marital Status: Married  Catering manager Violence: Not At Risk (11/22/2022)   Humiliation, Afraid, Rape, and Kick questionnaire    Fear of Current or Ex-Partner: No    Emotionally Abused: No    Physically Abused: No    Sexually Abused: No    Physical Exam      Future Appointments  Date Time Provider Department Center  12/10/2022  7:00 AM [facility]  01/19/2023  1:15 PM McCaughan, Dia D, DPM [facility]  01/31/2023  8:20 AM Dana Allan, MD [facility]  03/11/2023  7:00 AM [facility]  11/23/2023  8:30 AM [facility] ANNUAL WELLNESS VISIT [facility]     ACTION: {Paramed Action:[phone]}

## 2022-12-10 ENCOUNTER — Ambulatory Visit (INDEPENDENT_AMBULATORY_CARE_PROVIDER_SITE_OTHER): Payer: PPO

## 2022-12-10 DIAGNOSIS — I472 Ventricular tachycardia, unspecified: Secondary | ICD-10-CM

## 2022-12-22 ENCOUNTER — Other Ambulatory Visit (HOSPITAL_COMMUNITY): Payer: Self-pay

## 2022-12-22 NOTE — Progress Notes (Signed)
Paramedicine Encounter    Patient ID: John Keith, male    DOB: 1951-05-22, 72 y.o.   MRN: [id number]   Complaints- none   Assessment- CAOX4, warm and dry seated at kitchen table, no shortness of breath, no chest pain today, no dizziness, no swelling, no weight gain, vitals within normal limits, lungs clear.   Compliance with meds- no missed doses in two weeks.   Pill box filled- for two weeks.   Refills needed- Gabapentin, Amiodarone, Folic Acid, Carvedilol. Methotrexate   Meds changes since last visit- none     Social changes- none   Arrived for home visit for John Keith who reports to be feeling well today. He states he did have one episode of chest pain that felt like indigestion on Saturday after work around 3pm. He reports taking a rolaid and laying down and it went away- he denied nausea, dizziness, shortness of breath or cold sweats with same. He denied any pain today. Lungs clear. Vitals obtained as noted. I reviewed meds and filled pill box for two weeks. Refills as noted will be called into [facility]. We reviewed upcoming appointments. I assisted him in setting up a yearly follow up with [facility]. He reports no other needs at present. I will see John Keith in two weeks, and he knows to reach out if needed.    BP 118/62   Pulse 65   Resp 16   Wt 232 lb 12.8 oz (105.6 kg)   SpO2 94%   BMI 24.95 kg/m  Weight yesterday-- 233lbs Last visit weight-- 233lbs   [name], EMT-Paramedic [phone]  ACTION: Home visit completed    Patient Care Team: Dana Allan, MD as PCP - General (Family Medicine) Pincus Sanes, MD as Consulting Physician (Internal Medicine) Chilton Si, MD as Attending Physician (Cardiology) Regan Lemming, MD as Consulting Physician (Cardiology)  Patient Active Problem List   Diagnosis Date Noted   Adverse reaction to statin medication 11/15/2022   Abnormal glucose 11/15/2022   Prostate cancer screening 11/15/2022   Chronic  systolic heart failure (HCC) 12/15/2021   Chronic combined systolic and diastolic CHF (congestive heart failure) (HCC) 12/15/2021   Nasal sore 12/15/2021   Syncope 12/07/2021   Ventricular tachycardia (HCC) 12/07/2021   Typical atrial flutter (HCC) 09/24/2021   Secondary hypercoagulable state (HCC) 09/24/2021   Abnormal x-ray of neck 06/16/2021   Abnormal MRI, lumbar spine 10/23/2020   Chronic low back pain 10/23/2020   Neuropathy 10/23/2020   Coronary artery disease of native artery of native heart with stable angina pectoris (HCC) 10/23/2020   Impingement syndrome of left shoulder region 08/22/2020   Pain in joint of left shoulder 07/24/2020   Chronic left shoulder pain 07/11/2020   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   Anemia 12/13/2019   Chest pain of uncertain etiology 10/24/2019   PVC (premature ventricular contraction) 10/24/2019   Gastric ulcer without hemorrhage or perforation 09/28/2019   COVID-19 virus detected 07/12/2019   Elevated troponin 07/07/2019   Elevated serum protein level 07/07/2019   Postherpetic neuralgia 08/17/2018   Benign prostatic hyperplasia 08/01/2018   Hip pain 06/29/2018   Aortic atherosclerosis (HCC) 09/22/2016   Fatigue 06/03/2016   Lower urinary tract symptoms (LUTS) 06/03/2016   Constipation 03/10/2016   Lumbar radiculopathy 02/20/2016   Encounter for immunization 11/26/2015   Insomnia 06/10/2015   External hemorrhoid 03/07/2015   Health care maintenance 01/09/2014   Carpal tunnel syndrome of left wrist 01/03/2013   Hyperlipidemia 11/09/2012   Vitamin D deficiency 11/09/2012  Essential hypertension 11/06/2012   Tobacco abuse 11/06/2012   Seasonal allergies 11/06/2012   Erectile dysfunction 11/06/2012   GERD (gastroesophageal reflux disease) 11/06/2012   DDD (degenerative disc disease), thoracolumbar 11/06/2012   Lactose intolerance 11/06/2012   Diverticulosis of colon without hemorrhage 11/06/2012    Current Outpatient Medications:     amiodarone (PACERONE) 200 MG tablet, TAKE 1 TABLET(200 MG) BY MOUTH DAILY, Disp: 30 tablet, Rfl: 5   apixaban (ELIQUIS) 5 MG TABS tablet, Take 1 tablet (5 mg total) by mouth 2 (two) times daily., Disp: 60 tablet, Rfl: 3   calcium carbonate (OS-CAL - DOSED IN MG OF ELEMENTAL CALCIUM) 1250 (500 Ca) MG tablet, Take 1 tablet (500 mg of elemental calcium total) by mouth daily with breakfast., Disp: 30 tablet, Rfl: 11   carvedilol (COREG) 6.25 MG tablet, Take 1 tablet (6.25 mg total) by mouth 2 (two) times daily with a meal., Disp: 60 tablet, Rfl: 6   Cholecalciferol (VITAMIN D-3) 125 MCG (5000 UT) TABS, Take 1 tablet by mouth daily., Disp: 30 tablet, Rfl: 11   ezetimibe (ZETIA) 10 MG tablet, Take 1 tablet (10 mg total) by mouth daily., Disp: 90 tablet, Rfl: 3   FARXIGA 10 MG TABS tablet, Take 1 tablet (10 mg total) by mouth daily., Disp: 90 tablet, Rfl: 3   ferrous sulfate 325 (65 FE) MG tablet, Take 325 mg by mouth daily with breakfast., Disp: , Rfl:    folic acid (FOLVITE) 1 MG tablet, TAKE 1 TABLET(1 MG) BY MOUTH DAILY, Disp: 30 tablet, Rfl: 11   gabapentin (NEURONTIN) 300 MG capsule, Take 900 mg by mouth daily., Disp: , Rfl:    methotrexate (RHEUMATREX) 2.5 MG tablet, Take 20 mg (8 tablets) every Wednesday. PROTECT FROM LIGHT AS DIRECTED, Disp: 150 tablet, Rfl: 0   Multiple Vitamin (MULTIVITAMIN) tablet, Take 1 tablet by mouth daily., Disp: , Rfl:    mupirocin ointment (BACTROBAN) 2 %, , Disp: , Rfl:    pantoprazole (PROTONIX) 40 MG tablet, Take 1 tablet (40 mg total) by mouth daily. 30 min before food, Disp: 90 tablet, Rfl: 3   sacubitril-valsartan (ENTRESTO) 24-26 MG, Take 1 tablet by mouth 2 (two) times daily., Disp: 180 tablet, Rfl: 3   spironolactone (ALDACTONE) 25 MG tablet, TAKE 1 TABLET(25 MG) BY MOUTH DAILY, Disp: 90 tablet, Rfl: 0 Allergies  Allergen Reactions   Crestor [Rosuvastatin Calcium] Other (See Comments)    Elevated muscle enzymes    Lipitor [Atorvastatin Calcium] Other (See  Comments)    Elevated muscle enzymes      Social History   Socioeconomic History   Marital status: Married    Spouse name: Gwendolyn   Number of children: 2   Years of education: Not on file   Highest education level: High school graduate  Occupational History   Occupation: Retired    Comment: Semi  Tobacco Use   Smoking status: Former    Packs/day: .5    Types: Cigarettes    Start date: 07/06/1971    Quit date: 06/26/2020    Years since quitting: 2.4   Smokeless tobacco: Never   Tobacco comments:    Former smoker 09/24/21  Vaping Use   Vaping Use: Never used  Substance and Sexual Activity   Alcohol use: No    Alcohol/week: 0.0 standard drinks of alcohol   Drug use: No   Sexual activity: Not on file  Other Topics Concern   Not on file  Social History Narrative   Married    12th  grade ed    On child    1 son, 1 daughter   Machine op   Owns guns    Wears seat belt, safe in relationship    Smoker    Retired 06/27/2019   Social Determinants of Health   Financial Resource Strain: Low Risk  (11/22/2022)   Overall Financial Resource Strain (CARDIA)    Difficulty of Paying Living Expenses: Not hard at all  Food Insecurity: No Food Insecurity (11/22/2022)   Hunger Vital Sign    Worried About Running Out of Food in the Last Year: Never true    Ran Out of Food in the Last Year: Never true  Transportation Needs: No Transportation Needs (11/22/2022)   PRAPARE - Administrator, Civil Service (Medical): No    Lack of Transportation (Non-Medical): No  Physical Activity: Sufficiently Active (11/22/2022)   Exercise Vital Sign    Days of Exercise per Week: 3 days    Minutes of Exercise per Session: 60 min  Stress: No Stress Concern Present (11/22/2022)   [employer] of Occupational Health - Occupational Stress Questionnaire    Feeling of Stress : Not at all  Social Connections: Socially Integrated (11/22/2022)   Social Connection and Isolation Panel [NHANES]     Frequency of Communication with Friends and Family: Once a week    Frequency of Social Gatherings with Friends and Family: More than three times a week    Attends Religious Services: More than 4 times per year    Active Member of [employer] or Organizations: Yes    Attends Banker Meetings: Never    Marital Status: Married  Catering manager Violence: Not At Risk (11/22/2022)   Humiliation, Afraid, Rape, and Kick questionnaire    Fear of Current or Ex-Partner: No    Emotionally Abused: No    Physically Abused: No    Sexually Abused: No    Physical Exam      Future Appointments  Date Time Provider Department Center  01/19/2023  1:15 PM McCaughan, Carlota Raspberry D, DPM [facility]  01/31/2023  8:20 AM Dana Allan, MD [facility]  03/11/2023  7:00 AM [facility] DEVICE REMOTES [facility]  04/11/2023  2:00 PM Regan Lemming, MD [facility]  11/23/2023  8:30 AM [facility] ANNUAL WELLNESS VISIT [facility]

## 2022-12-28 NOTE — Progress Notes (Signed)
Remote ICD transmission.   

## 2022-12-29 NOTE — Telephone Encounter (Signed)
Done

## 2023-01-03 ENCOUNTER — Telehealth (HOSPITAL_COMMUNITY): Payer: Self-pay | Admitting: Emergency Medicine

## 2023-01-03 NOTE — Telephone Encounter (Signed)
Reaching out to patient to offer assistance regarding upcoming cardiac imaging study; pt verbalizes understanding of appt date/time, parking situation and where to check in, pre-test NPO status and medications ordered, and verified current allergies; name and call back number provided for further questions should they arise Hanan Mcwilliams RN Navigator Cardiac Imaging [facility] [phone] office [phone] cell 

## 2023-01-04 ENCOUNTER — Other Ambulatory Visit: Payer: Self-pay | Admitting: Family

## 2023-01-05 ENCOUNTER — Other Ambulatory Visit: Payer: Self-pay

## 2023-01-05 ENCOUNTER — Encounter (HOSPITAL_COMMUNITY)
Admission: RE | Admit: 2023-01-05 | Discharge: 2023-01-05 | Disposition: A | Discharge status: Home / Self Care | Payer: PPO | Source: Ambulatory Visit | Attending: Internal Medicine | Admitting: Internal Medicine

## 2023-01-05 ENCOUNTER — Other Ambulatory Visit (HOSPITAL_COMMUNITY): Payer: Self-pay

## 2023-01-05 ENCOUNTER — Telehealth: Payer: Self-pay

## 2023-01-05 ENCOUNTER — Other Ambulatory Visit: Payer: Self-pay | Admitting: Family Medicine

## 2023-01-05 DIAGNOSIS — D8685 Sarcoid myocarditis: Secondary | ICD-10-CM | POA: Diagnosis not present

## 2023-01-05 DIAGNOSIS — I5022 Chronic systolic (congestive) heart failure: Secondary | ICD-10-CM | POA: Diagnosis not present

## 2023-01-05 DIAGNOSIS — G629 Polyneuropathy, unspecified: Secondary | ICD-10-CM

## 2023-01-05 DIAGNOSIS — I700 Atherosclerosis of aorta: Secondary | ICD-10-CM | POA: Diagnosis not present

## 2023-01-05 LAB — NM PET CT MYOCARDIAL SARCOIDOSIS
Nuc Stress EF: 27 %
Rest Nuclear Isotope Dose: 27.2 mCi

## 2023-01-05 MED ORDER — GABAPENTIN 300 MG PO CAPS: 900.0000 mg | ORAL_CAPSULE | Freq: Every day | ORAL | 3 refills | Status: DC

## 2023-01-05 MED ORDER — FLUDEOXYGLUCOSE F - 18 (FDG) INJECTION
9.5000 | Freq: Once | INTRAVENOUS | Status: AC
  Administered 2023-01-05: 9.5 via INTRAVENOUS

## 2023-01-05 MED ORDER — RUBIDIUM RB82 GENERATOR (RUBYFILL)
27.2000 | PACK | Freq: Once | INTRAVENOUS | Status: AC
  Administered 2023-01-05: 27.2 via INTRAVENOUS

## 2023-01-05 NOTE — Telephone Encounter (Signed)
Prescription Request  01/05/2023  LOV: Visit date not found  What is the name of the medication or equipment? gabapentin (NEURONTIN) 300 MG capsule  Have you contacted your pharmacy to request a refill? Yes   Which pharmacy would you like this sent to?   [facility] [phone] [facility], [place] - [address] AT [facility] OF [address] [place] [address] Phone: [phone] Fax: [phone]    Patient notified that their request is being sent to the clinical staff for review and that they should receive a response within 2 business days.   Please advise at Mobile [phone] (mobile)  Patient states he is out of this medication.  Patient states he would like to have this medication today because his pharmacy is closed tomorrow.

## 2023-01-05 NOTE — Telephone Encounter (Signed)
Refill request sent to PCP.

## 2023-01-05 NOTE — Telephone Encounter (Signed)
gabapentin (NEURONTIN) 300 MG capsule  LOV 10/29/2022 NOV 01/31/2023

## 2023-01-05 NOTE — Progress Notes (Signed)
Paramedicine Encounter    Patient ID: John Keith, male    DOB: 10-Jan-1951, 72 y.o.   MRN: [id number]   Complaints- no complaints   Assessment- CAOx4, warm and dry ambulatory, no shortness of  breath, no dizziness, no chest pain, no swelling, no weight gain.   Compliance with meds- no missed doses over the last week   Pill box filled- for two weeks   Refills needed- none   Meds changes since last visit- none     Social changes- none    BP 102/64   Pulse 65   Resp 16   Wt 234 lb 6.4 oz (106.3 kg)   SpO2 95%   BMI 25.12 kg/m  Weight yesterday-- 235lbs  Last visit weight-- 232lbs    Arrived for home visit for John Keith who reports to be feeling good today with no complaints. He had his cardiac PET scan done today. Vitals and assessment obtained. Meds reviewed- pill boxes filled for two weeks. We discussed upcoming appointments and confirmed same. We discussed HF education including fluid and salt intake. He understood. No other needs at present. I will follow up in two weeks on July 18th.   [name], EMT-Paramedic [phone]  ACTION: Home visit completed    Patient Care Team: Dana Allan, MD as PCP - General (Family Medicine) Pincus Sanes, MD as Consulting Physician (Internal Medicine) Chilton Si, MD as Attending Physician (Cardiology) Regan Lemming, MD as Consulting Physician (Cardiology)  Patient Active Problem List   Diagnosis Date Noted   Adverse reaction to statin medication 11/15/2022   Abnormal glucose 11/15/2022   Prostate cancer screening 11/15/2022   Chronic systolic heart failure (HCC) 12/15/2021   Chronic combined systolic and diastolic CHF (congestive heart failure) (HCC) 12/15/2021   Nasal sore 12/15/2021   Syncope 12/07/2021   Ventricular tachycardia (HCC) 12/07/2021   Typical atrial flutter (HCC) 09/24/2021   Secondary hypercoagulable state (HCC) 09/24/2021   Abnormal x-ray of neck 06/16/2021   Abnormal MRI, lumbar  spine 10/23/2020   Chronic low back pain 10/23/2020   Neuropathy 10/23/2020   Coronary artery disease of native artery of native heart with stable angina pectoris (HCC) 10/23/2020   Impingement syndrome of left shoulder region 08/22/2020   Pain in joint of left shoulder 07/24/2020   Chronic left shoulder pain 07/11/2020   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   Anemia 12/13/2019   Chest pain of uncertain etiology 10/24/2019   PVC (premature ventricular contraction) 10/24/2019   Gastric ulcer without hemorrhage or perforation 09/28/2019   COVID-19 virus detected 07/12/2019   Elevated troponin 07/07/2019   Elevated serum protein level 07/07/2019   Postherpetic neuralgia 08/17/2018   Benign prostatic hyperplasia 08/01/2018   Hip pain 06/29/2018   Aortic atherosclerosis (HCC) 09/22/2016   Fatigue 06/03/2016   Lower urinary tract symptoms (LUTS) 06/03/2016   Constipation 03/10/2016   Lumbar radiculopathy 02/20/2016   Encounter for immunization 11/26/2015   Insomnia 06/10/2015   External hemorrhoid 03/07/2015   Health care maintenance 01/09/2014   Carpal tunnel syndrome of left wrist 01/03/2013   Hyperlipidemia 11/09/2012   Vitamin D deficiency 11/09/2012   Essential hypertension 11/06/2012   Tobacco abuse 11/06/2012   Seasonal allergies 11/06/2012   Erectile dysfunction 11/06/2012   GERD (gastroesophageal reflux disease) 11/06/2012   DDD (degenerative disc disease), thoracolumbar 11/06/2012   Lactose intolerance 11/06/2012   Diverticulosis of colon without hemorrhage 11/06/2012    Current Outpatient Medications:    amiodarone (PACERONE) 200 MG tablet, TAKE 1 TABLET(200 MG) BY  MOUTH DAILY, Disp: 30 tablet, Rfl: 5   apixaban (ELIQUIS) 5 MG TABS tablet, Take 1 tablet (5 mg total) by mouth 2 (two) times daily., Disp: 60 tablet, Rfl: 3   calcium carbonate (OS-CAL - DOSED IN MG OF ELEMENTAL CALCIUM) 1250 (500 Ca) MG tablet, Take 1 tablet (500 mg of elemental calcium total) by mouth daily  with breakfast., Disp: 30 tablet, Rfl: 11   carvedilol (COREG) 6.25 MG tablet, Take 1 tablet (6.25 mg total) by mouth 2 (two) times daily with a meal., Disp: 60 tablet, Rfl: 6   Cholecalciferol (VITAMIN D-3) 125 MCG (5000 UT) TABS, Take 1 tablet by mouth daily., Disp: 30 tablet, Rfl: 11   ezetimibe (ZETIA) 10 MG tablet, Take 1 tablet (10 mg total) by mouth daily., Disp: 90 tablet, Rfl: 3   FARXIGA 10 MG TABS tablet, Take 1 tablet (10 mg total) by mouth daily., Disp: 90 tablet, Rfl: 3   ferrous sulfate 325 (65 FE) MG tablet, Take 325 mg by mouth daily with breakfast., Disp: , Rfl:    folic acid (FOLVITE) 1 MG tablet, TAKE 1 TABLET(1 MG) BY MOUTH DAILY, Disp: 30 tablet, Rfl: 11   gabapentin (NEURONTIN) 300 MG capsule, Take 3 capsules (900 mg total) by mouth daily., Disp: 270 capsule, Rfl: 3   methotrexate (RHEUMATREX) 2.5 MG tablet, Take 20 mg (8 tablets) every Wednesday. PROTECT FROM LIGHT AS DIRECTED, Disp: 150 tablet, Rfl: 0   Multiple Vitamin (MULTIVITAMIN) tablet, Take 1 tablet by mouth daily., Disp: , Rfl:    mupirocin ointment (BACTROBAN) 2 %, , Disp: , Rfl:    pantoprazole (PROTONIX) 40 MG tablet, Take 1 tablet (40 mg total) by mouth daily. 30 min before food, Disp: 90 tablet, Rfl: 3   sacubitril-valsartan (ENTRESTO) 24-26 MG, Take 1 tablet by mouth 2 (two) times daily., Disp: 180 tablet, Rfl: 3   spironolactone (ALDACTONE) 25 MG tablet, TAKE 1 TABLET(25 MG) BY MOUTH DAILY, Disp: 90 tablet, Rfl: 0 Allergies  Allergen Reactions   Crestor [Rosuvastatin Calcium] Other (See Comments)    Elevated muscle enzymes    Lipitor [Atorvastatin Calcium] Other (See Comments)    Elevated muscle enzymes      Social History   Socioeconomic History   Marital status: Married    Spouse name: John Keith   Number of children: 2   Years of education: Not on file   Highest education level: High school graduate  Occupational History   Occupation: Retired    Comment: Semi  Tobacco Use   Smoking  status: Former    Packs/day: .5    Types: Cigarettes    Start date: 07/06/1971    Quit date: 06/26/2020    Years since quitting: 2.5   Smokeless tobacco: Never   Tobacco comments:    Former smoker 09/24/21  Vaping Use   Vaping Use: Never used  Substance and Sexual Activity   Alcohol use: No    Alcohol/week: 0.0 standard drinks of alcohol   Drug use: No   Sexual activity: Not on file  Other Topics Concern   Not on file  Social History Narrative   Married    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    On child    1 son, 1 daughter   Machine op   Owns guns    Wears seat belt, safe in relationship    Smoker    Retired 06/27/2019   Social Determinants of Health   Financial Resource Strain: Low Risk  (11/22/2022)   Overall Physicist, medical Strain (  CARDIA)    Difficulty of Paying Living Expenses: Not hard at all  Food Insecurity: No Food Insecurity (11/22/2022)   Hunger Vital Sign    Worried About Running Out of Food in the Last Year: Never true    Ran Out of Food in the Last Year: Never true  Transportation Needs: No Transportation Needs (11/22/2022)   PRAPARE - Administrator, Civil Service (Medical): No    Lack of Transportation (Non-Medical): No  Physical Activity: Sufficiently Active (11/22/2022)   Exercise Vital Sign    Days of Exercise per Week: 3 days    Minutes of Exercise per Session: 60 min  Stress: No Stress Concern Present (11/22/2022)   [employer] of Occupational Health - Occupational Stress Questionnaire    Feeling of Stress : Not at all  Social Connections: Socially Integrated (11/22/2022)   Social Connection and Isolation Panel [NHANES]    Frequency of Communication with Friends and Family: Once a week    Frequency of Social Gatherings with Friends and Family: More than three times a week    Attends Religious Services: More than 4 times per year    Active Member of [employer] or Organizations: Yes    Attends Banker Meetings: Never    Marital Status: Married   Catering manager Violence: Not At Risk (11/22/2022)   Humiliation, Afraid, Rape, and Kick questionnaire    Fear of Current or Ex-Partner: No    Emotionally Abused: No    Physically Abused: No    Sexually Abused: No    Physical Exam      Future Appointments  Date Time Provider Department Center  01/19/2023  1:15 PM McCaughan, Dia D, DPM [facility]  01/31/2023  8:20 AM Dana Allan, MD [facility]  03/11/2023  7:00 AM [facility] DEVICE REMOTES [facility]  04/11/2023  2:00 PM Regan Lemming, MD [facility]  06/10/2023  7:00 AM [facility] DEVICE REMOTES [facility]  09/09/2023  7:00 AM [facility] DEVICE REMOTES [facility]  11/23/2023  8:30 AM [facility] ANNUAL WELLNESS VISIT [facility]  12/09/2023  7:00 AM [facility] DEVICE REMOTES [facility]  03/09/2024  7:00 AM [facility] DEVICE REMOTES [facility]  06/08/2024  7:00 AM [facility] DEVICE REMOTES [facility]

## 2023-01-19 ENCOUNTER — Ambulatory Visit: Payer: PPO | Admitting: Podiatry

## 2023-01-19 DIAGNOSIS — B351 Tinea unguium: Secondary | ICD-10-CM | POA: Diagnosis not present

## 2023-01-19 DIAGNOSIS — L600 Ingrowing nail: Secondary | ICD-10-CM | POA: Diagnosis not present

## 2023-01-19 DIAGNOSIS — M79674 Pain in right toe(s): Secondary | ICD-10-CM | POA: Diagnosis not present

## 2023-01-19 DIAGNOSIS — M79675 Pain in left toe(s): Secondary | ICD-10-CM

## 2023-01-19 NOTE — Progress Notes (Signed)
Subjective:  Patient ID: John Keith, male    DOB: Mar 05, 1951,  MRN: [id number]  John Keith presents to clinic today for:  Chief Complaint  Patient presents with   Ingrown Toenail    Pt has an ingrown nail on his right great toe he got it 2 weeks ago and is causing him lots of pain, he is also here for [facility]   Patient presents for painful, thickened, elongated toenails x 10.  Also notes an ingrown toenail to the right great toe along the medial margin.  States been present for approximately 2 weeks.  Denies any current drainage.  He has had a procedure on the left great toe and states this worked well, so he is requesting a procedure on the right great toe today.  PCP is Dana Allan, MD.  Allergies  Allergen Reactions   Crestor [Rosuvastatin Calcium] Other (See Comments)    Elevated muscle enzymes    Lipitor [Atorvastatin Calcium] Other (See Comments)    Elevated muscle enzymes    Review of Systems: Negative except as noted in the HPI.  Objective:  There were no vitals filed for this visit.  John Keith is a pleasant 72 y.o. male in NAD. AAO x 3.  Vascular Examination: Capillary refill time is 3-5 seconds to toes bilateral. Palpable pedal pulses b/l LE. Digital hair present b/l. No pedal edema b/l. Skin temperature gradient WNL b/l. No varicosities b/l. No cyanosis or clubbing noted b/l.   Dermatological Examination: There is incurvation of the right hallux medial nail border.  There is pain on palpation of the affected nail border.  Localized erythema and minimal edema.  No active drainage or purulence noted.  The nails x 10 are 3 mm thick, elongated, with yellow discoloration and subungual debris and pain with compression.     Latest Ref Rng & Units 10/29/2022    9:53 AM  Hemoglobin A1C  Hemoglobin-A1c 4.6 - 6.5 % 5.6     Assessment/Plan: 1. Ingrown toenail   2. Pain due to onychomycosis of toenails of both feet     Discussed patient's condition today.   After obtaining patient consent, the right great toe was anesthetized with a 50:50 mixture of 1% lidocaine plain and 0.5% bupivacaine plain for a total of 3cc's administered.  Upon confirmation of anesthesia, a freer elevator was utilized to free the right hallux medial nail border from the nail bed.  The nail border was then avulsed proximal to the eponychium and removed in toto.  The area was inspected for any remaining spicules.  A chemical matrixectomy was performed with phenol and neutralized with alcohol solution.  Antibiotic ointment and a DSD were applied, followed by a Coban dressing.  Patient tolerated the anesthetic and procedure well and will f/u in 2-3 weeks for recheck.  Patient given post-procedure instructions for Epsom salt soaks, antibiotic ointment and daily use of Bandaids until toe starts to dry / form eschar.   Nails x 10 were debrided with sterile nail nippers and a power debriding bur.   Return in about 3 months (around 04/21/2023) for RFC.   Clerance Lav, DPM, FACFAS [facility]     [address], [place] [address]  Office [phone]  Fax [phone]

## 2023-01-20 ENCOUNTER — Other Ambulatory Visit (HOSPITAL_COMMUNITY): Payer: Self-pay

## 2023-01-20 ENCOUNTER — Other Ambulatory Visit (HOSPITAL_COMMUNITY): Payer: Self-pay | Admitting: Cardiology

## 2023-01-20 MED ORDER — SPIRONOLACTONE 25 MG PO TABS: 25.0000 mg | ORAL_TABLET | Freq: Every day | ORAL | 0 refills | Status: DC

## 2023-01-20 NOTE — Progress Notes (Signed)
Paramedicine Encounter    Patient ID: John Keith, male    DOB: [birth year], 72 y.o.   MRN: [id number]   Complaints- Toe pain from ingrown removal yesterday.   Assessment- CAOx4, warm and dry seated at kitchen table, no chest pain, no SHOB, no dizziness, no swelling, lungs clear, vitals stable.   Compliance with meds- missed last nights medications due to schedule change and pill box delay    Pill box filled- for one week   Refills needed- spironolactone   Meds changes since last visit- none     Social changes- none    VISIT SUMMARY- Arrived for home visit for John Keith who reports to be feeling well with no complaints other than some toe pain from the ingrown nail he had removed yesterday. He states he has had no chest pain, shortness of breath, dizziness, weight gain or swelling over the last week. He states that he missed last nights pills because there was not enough in the pill box to last him due to him requesting a schedule change to Thursdays. I reviewed meds and filled pill box for one week. We reviewed upcoming appointments and confirmed same. He agreed with visit in one week. Also discussed PET Scan results.   BP 122/70   Pulse 68   Resp 16   Wt 232 lb 3.2 oz (105.3 kg)   SpO2 96%   BMI 24.88 kg/m  Weight yesterday-- 232lbs Last visit weight-- 234lbs      ACTION: Home visit completed     Patient Care Team: Dana Allan, MD as PCP - General (Family Medicine) Pincus Sanes, MD as Consulting Physician (Internal Medicine) Chilton Si, MD as Attending Physician (Cardiology) Regan Lemming, MD as Consulting Physician (Cardiology)  Patient Active Problem List   Diagnosis Date Noted   Adverse reaction to statin medication 11/15/2022   Abnormal glucose 11/15/2022   Prostate cancer screening 11/15/2022   Chronic systolic heart failure (HCC) 12/15/2021   Chronic combined systolic and diastolic CHF (congestive heart failure) (HCC) 12/15/2021   Nasal  sore 12/15/2021   Syncope 12/07/2021   Ventricular tachycardia (HCC) 12/07/2021   Typical atrial flutter (HCC) 09/24/2021   Secondary hypercoagulable state (HCC) 09/24/2021   Abnormal x-ray of neck 06/16/2021   Abnormal MRI, lumbar spine 10/23/2020   Chronic low back pain 10/23/2020   Neuropathy 10/23/2020   Coronary artery disease of native artery of native heart with stable angina pectoris (HCC) 10/23/2020   Impingement syndrome of left shoulder region 08/22/2020   Pain in joint of left shoulder 07/24/2020   Chronic left shoulder pain 07/11/2020   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   Anemia 12/13/2019   Chest pain of uncertain etiology 10/24/2019   PVC (premature ventricular contraction) 10/24/2019   Gastric ulcer without hemorrhage or perforation 09/28/2019   COVID-19 virus detected 07/12/2019   Elevated troponin 07/07/2019   Elevated serum protein level 07/07/2019   Postherpetic neuralgia 08/17/2018   Benign prostatic hyperplasia 08/01/2018   Hip pain 06/29/2018   Aortic atherosclerosis (HCC) 09/22/2016   Fatigue 06/03/2016   Lower urinary tract symptoms (LUTS) 06/03/2016   Constipation 03/10/2016   Lumbar radiculopathy 02/20/2016   Encounter for immunization 11/26/2015   Insomnia 06/10/2015   External hemorrhoid 03/07/2015   Health care maintenance 01/09/2014   Carpal tunnel syndrome of left wrist 01/03/2013   Hyperlipidemia 11/09/2012   Vitamin D deficiency 11/09/2012   Essential hypertension 11/06/2012   Tobacco abuse 11/06/2012   Seasonal allergies 11/06/2012   Erectile dysfunction 11/06/2012  GERD (gastroesophageal reflux disease) 11/06/2012   DDD (degenerative disc disease), thoracolumbar 11/06/2012   Lactose intolerance 11/06/2012   Diverticulosis of colon without hemorrhage 11/06/2012   Diverticulosis of colon 11/06/2012   Degeneration of thoracolumbar intervertebral disc 11/06/2012   Gastroesophageal reflux disease 11/06/2012   Tobacco user 11/06/2012     Current Outpatient Medications:    amiodarone (PACERONE) 200 MG tablet, TAKE 1 TABLET(200 MG) BY MOUTH DAILY, Disp: 30 tablet, Rfl: 5   apixaban (ELIQUIS) 5 MG TABS tablet, Take 1 tablet (5 mg total) by mouth 2 (two) times daily., Disp: 60 tablet, Rfl: 3   calcium carbonate (OS-CAL - DOSED IN MG OF ELEMENTAL CALCIUM) 1250 (500 Ca) MG tablet, Take 1 tablet (500 mg of elemental calcium total) by mouth daily with breakfast., Disp: 30 tablet, Rfl: 11   carvedilol (COREG) 6.25 MG tablet, Take 1 tablet (6.25 mg total) by mouth 2 (two) times daily with a meal., Disp: 60 tablet, Rfl: 6   Cholecalciferol (VITAMIN D-3) 125 MCG (5000 UT) TABS, Take 1 tablet by mouth daily., Disp: 30 tablet, Rfl: 11   ezetimibe (ZETIA) 10 MG tablet, Take 1 tablet (10 mg total) by mouth daily., Disp: 90 tablet, Rfl: 3   FARXIGA 10 MG TABS tablet, Take 1 tablet (10 mg total) by mouth daily., Disp: 90 tablet, Rfl: 3   ferrous sulfate 325 (65 FE) MG tablet, Take 325 mg by mouth daily with breakfast., Disp: , Rfl:    folic acid (FOLVITE) 1 MG tablet, TAKE 1 TABLET(1 MG) BY MOUTH DAILY, Disp: 30 tablet, Rfl: 11   gabapentin (NEURONTIN) 300 MG capsule, Take 3 capsules (900 mg total) by mouth daily., Disp: 270 capsule, Rfl: 3   methotrexate (RHEUMATREX) 2.5 MG tablet, Take 20 mg (8 tablets) every Wednesday. PROTECT FROM LIGHT AS DIRECTED, Disp: 150 tablet, Rfl: 0   Multiple Vitamin (MULTIVITAMIN) tablet, Take 1 tablet by mouth daily., Disp: , Rfl:    mupirocin ointment (BACTROBAN) 2 %, , Disp: , Rfl:    pantoprazole (PROTONIX) 40 MG tablet, Take 1 tablet (40 mg total) by mouth daily. 30 min before food, Disp: 90 tablet, Rfl: 3   sacubitril-valsartan (ENTRESTO) 24-26 MG, Take 1 tablet by mouth 2 (two) times daily., Disp: 180 tablet, Rfl: 3   spironolactone (ALDACTONE) 25 MG tablet, TAKE 1 TABLET(25 MG) BY MOUTH DAILY, Disp: 90 tablet, Rfl: 0 Allergies  Allergen Reactions   Crestor [Rosuvastatin Calcium] Other (See Comments)     Elevated muscle enzymes    Lipitor [Atorvastatin Calcium] Other (See Comments)    Elevated muscle enzymes      Social History   Socioeconomic History   Marital status: Married    Spouse name: Gwendolyn   Number of children: 2   Years of education: Not on file   Highest education level: High school graduate  Occupational History   Occupation: Retired    Comment: Semi  Tobacco Use   Smoking status: Former    Current packs/day: 0.00    Average packs/day: 0.5 packs/day for 49.0 years (24.5 ttl pk-yrs)    Types: Cigarettes    Start date: 07/06/1971    Quit date: 06/26/2020    Years since quitting: 2.5   Smokeless tobacco: Never   Tobacco comments:    Former smoker 09/24/21  Vaping Use   Vaping status: Never Used  Substance and Sexual Activity   Alcohol use: No    Alcohol/week: 0.0 standard drinks of alcohol   Drug use: No   Sexual activity:  Not on file  Other Topics Concern   Not on file  Social History Narrative   Married    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    On child    1 son, 1 daughter   Machine op   Owns guns    Wears seat belt, safe in relationship    Smoker    Retired 06/27/2019   Social Determinants of Health   Financial Resource Strain: Low Risk  (11/22/2022)   Overall Financial Resource Strain (CARDIA)    Difficulty of Paying Living Expenses: Not hard at all  Food Insecurity: No Food Insecurity (11/22/2022)   Hunger Vital Sign    Worried About Running Out of Food in the Last Year: Never true    Ran Out of Food in the Last Year: Never true  Transportation Needs: No Transportation Needs (11/22/2022)   PRAPARE - Administrator, Civil Service (Medical): No    Lack of Transportation (Non-Medical): No  Physical Activity: Sufficiently Active (11/22/2022)   Exercise Vital Sign    Days of Exercise per Week: 3 days    Minutes of Exercise per Session: 60 min  Stress: No Stress Concern Present (11/22/2022)   [employer] of Occupational Health - Occupational Stress  Questionnaire    Feeling of Stress : Not at all  Social Connections: Socially Integrated (11/22/2022)   Social Connection and Isolation Panel [NHANES]    Frequency of Communication with Friends and Family: Once a week    Frequency of Social Gatherings with Friends and Family: More than three times a week    Attends Religious Services: More than 4 times per year    Active Member of [employer] or Organizations: Yes    Attends Banker Meetings: Never    Marital Status: Married  Catering manager Violence: Not At Risk (11/22/2022)   Humiliation, Afraid, Rape, and Kick questionnaire    Fear of Current or Ex-Partner: No    Emotionally Abused: No    Physically Abused: No    Sexually Abused: No    Physical Exam      Future Appointments  Date Time Provider Department Center  01/31/2023  8:20 AM Dana Allan, MD [facility]  03/11/2023  7:00 AM [facility] DEVICE REMOTES [facility]  04/11/2023  2:00 PM Regan Lemming, MD [facility]  05/04/2023  8:45 AM McCaughan, Dia D, DPM [facility]  06/10/2023  7:00 AM [facility] DEVICE REMOTES [facility]  09/09/2023  7:00 AM [facility] DEVICE REMOTES [facility]  11/23/2023  8:30 AM [facility] ANNUAL WELLNESS VISIT [facility]  12/09/2023  7:00 AM [facility] DEVICE REMOTES [facility]  03/09/2024  7:00 AM [facility] DEVICE REMOTES [facility]  06/08/2024  7:00 AM [facility] DEVICE REMOTES [facility]

## 2023-01-27 ENCOUNTER — Other Ambulatory Visit (HOSPITAL_COMMUNITY): Payer: Self-pay | Admitting: Emergency Medicine

## 2023-01-27 ENCOUNTER — Telehealth (HOSPITAL_COMMUNITY): Payer: Self-pay | Admitting: Cardiology

## 2023-01-27 NOTE — Progress Notes (Signed)
Paramedicine Encounter    Patient ID: John Keith, male    DOB: 05-22-1951, 72 y.o.   MRN: [id number]   Complaints - infrequent intermittent dizziness at rest. Denies any increased dizziness with standing.  Assessment - CAOx4, warm and dry, ambulatory on scene. No pedal edema.Lung sounds clear.   Compliance with meds - no missed doses   Pill box filled - for 2 weeks  Refills needed - Carvedilol and Entresto   Meds changes since last visit - instructed to skip entresto dose for this evening by Veronia Beets, CMA.     Social changes - no social changes reported.     VISIT SUMMARY**  Met with John Keith in the home today. Pt was ambulatory without fatigue or exhaustion. Pt reported that he had been complaint with his meds, daily pressures, and weights. Pt was assessed as noted to be hypotensive. Pt had recorded some soft pressures this last week. No orthostatics noted. Contacted [facility] Triage and informed of same and responded to hold evening entresto dose. Triage also requested a [employer] transmission. No futher information given, will follow up with pt and clinic on Monday. Pt knows to reach out to the clinic or call 911 if he feels it is necessary. Pill box filled for 2x weeks. Upcoming appointments reviewed. Will follow up in 1x week.    BP 92/60 (Patient Position: Sitting)   Pulse 64   Resp 16   Wt 233 lb 3.2 oz (105.8 kg)   SpO2 95%   BMI 24.99 kg/m  Weight yesterday-U/A Last visit weight- 232 lb 3.2 oz     [name] Setting EMT-P Community Paramedic  [phone]    ACTION: Home visit completed     Patient Care Team: Dana Allan, MD as PCP - General (Family Medicine) Pincus Sanes, MD as Consulting Physician (Internal Medicine) Chilton Si, MD as Attending Physician (Cardiology) Regan Lemming, MD as Consulting Physician (Cardiology)  Patient Active Problem List   Diagnosis Date Noted   Adverse reaction to statin medication 11/15/2022    Abnormal glucose 11/15/2022   Prostate cancer screening 11/15/2022   Chronic systolic heart failure (HCC) 12/15/2021   Chronic combined systolic and diastolic CHF (congestive heart failure) (HCC) 12/15/2021   Nasal sore 12/15/2021   Syncope 12/07/2021   Ventricular tachycardia (HCC) 12/07/2021   Typical atrial flutter (HCC) 09/24/2021   Secondary hypercoagulable state (HCC) 09/24/2021   Abnormal x-ray of neck 06/16/2021   Abnormal MRI, lumbar spine 10/23/2020   Chronic low back pain 10/23/2020   Neuropathy 10/23/2020   Coronary artery disease of native artery of native heart with stable angina pectoris (HCC) 10/23/2020   Impingement syndrome of left shoulder region 08/22/2020   Pain in joint of left shoulder 07/24/2020   Chronic left shoulder pain 07/11/2020   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   Anemia 12/13/2019   Chest pain of uncertain etiology 10/24/2019   PVC (premature ventricular contraction) 10/24/2019   Gastric ulcer without hemorrhage or perforation 09/28/2019   COVID-19 virus detected 07/12/2019   Elevated troponin 07/07/2019   Elevated serum protein level 07/07/2019   Postherpetic neuralgia 08/17/2018   Benign prostatic hyperplasia 08/01/2018   Hip pain 06/29/2018   Aortic atherosclerosis (HCC) 09/22/2016   Fatigue 06/03/2016   Lower urinary tract symptoms (LUTS) 06/03/2016   Constipation 03/10/2016   Lumbar radiculopathy 02/20/2016   Encounter for immunization 11/26/2015   Insomnia 06/10/2015   External hemorrhoid 03/07/2015   Health care maintenance 01/09/2014   Carpal tunnel syndrome of left  wrist 01/03/2013   Hyperlipidemia 11/09/2012   Vitamin D deficiency 11/09/2012   Essential hypertension 11/06/2012   Tobacco abuse 11/06/2012   Seasonal allergies 11/06/2012   Erectile dysfunction 11/06/2012   GERD (gastroesophageal reflux disease) 11/06/2012   DDD (degenerative disc disease), thoracolumbar 11/06/2012   Lactose intolerance 11/06/2012   Diverticulosis  of colon without hemorrhage 11/06/2012   Diverticulosis of colon 11/06/2012   Degeneration of thoracolumbar intervertebral disc 11/06/2012   Gastroesophageal reflux disease 11/06/2012   Tobacco user 11/06/2012    Current Outpatient Medications:    amiodarone (PACERONE) 200 MG tablet, TAKE 1 TABLET(200 MG) BY MOUTH DAILY, Disp: 30 tablet, Rfl: 5   apixaban (ELIQUIS) 5 MG TABS tablet, Take 1 tablet (5 mg total) by mouth 2 (two) times daily., Disp: 60 tablet, Rfl: 3   calcium carbonate (OS-CAL - DOSED IN MG OF ELEMENTAL CALCIUM) 1250 (500 Ca) MG tablet, Take 1 tablet (500 mg of elemental calcium total) by mouth daily with breakfast., Disp: 30 tablet, Rfl: 11   carvedilol (COREG) 6.25 MG tablet, Take 1 tablet (6.25 mg total) by mouth 2 (two) times daily with a meal., Disp: 60 tablet, Rfl: 6   Cholecalciferol (VITAMIN D-3) 125 MCG (5000 UT) TABS, Take 1 tablet by mouth daily., Disp: 30 tablet, Rfl: 11   ezetimibe (ZETIA) 10 MG tablet, Take 1 tablet (10 mg total) by mouth daily., Disp: 90 tablet, Rfl: 3   FARXIGA 10 MG TABS tablet, Take 1 tablet (10 mg total) by mouth daily., Disp: 90 tablet, Rfl: 3   ferrous sulfate 325 (65 FE) MG tablet, Take 325 mg by mouth daily with breakfast., Disp: , Rfl:    folic acid (FOLVITE) 1 MG tablet, TAKE 1 TABLET(1 MG) BY MOUTH DAILY, Disp: 30 tablet, Rfl: 11   gabapentin (NEURONTIN) 300 MG capsule, Take 3 capsules (900 mg total) by mouth daily., Disp: 270 capsule, Rfl: 3   methotrexate (RHEUMATREX) 2.5 MG tablet, Take 20 mg (8 tablets) every Wednesday. PROTECT FROM LIGHT AS DIRECTED, Disp: 150 tablet, Rfl: 0   Multiple Vitamin (MULTIVITAMIN) tablet, Take 1 tablet by mouth daily., Disp: , Rfl:    mupirocin ointment (BACTROBAN) 2 %, , Disp: , Rfl:    pantoprazole (PROTONIX) 40 MG tablet, Take 1 tablet (40 mg total) by mouth daily. 30 min before food, Disp: 90 tablet, Rfl: 3   sacubitril-valsartan (ENTRESTO) 24-26 MG, Take 1 tablet by mouth 2 (two) times daily., Disp:  180 tablet, Rfl: 3   spironolactone (ALDACTONE) 25 MG tablet, Take 1 tablet (25 mg total) by mouth daily., Disp: 90 tablet, Rfl: 0 Allergies  Allergen Reactions   Crestor [Rosuvastatin Calcium] Other (See Comments)    Elevated muscle enzymes    Lipitor [Atorvastatin Calcium] Other (See Comments)    Elevated muscle enzymes      Social History   Socioeconomic History   Marital status: Married    Spouse name: John Keith   Number of children: 2   Years of education: Not on file   Highest education level: High school graduate  Occupational History   Occupation: Retired    Comment: Semi  Tobacco Use   Smoking status: Former    Current packs/day: 0.00    Average packs/day: 0.5 packs/day for 49.0 years (24.5 ttl pk-yrs)    Types: Cigarettes    Start date: 07/06/1971    Quit date: 06/26/2020    Years since quitting: 2.5   Smokeless tobacco: Never   Tobacco comments:    Former smoker 09/24/21  Vaping Use   Vaping status: Never Used  Substance and Sexual Activity   Alcohol use: No    Alcohol/week: 0.0 standard drinks of alcohol   Drug use: No   Sexual activity: Not on file  Other Topics Concern   Not on file  Social History Narrative   Married    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    On child    1 son, 1 daughter   Machine op   Owns guns    Wears seat belt, safe in relationship    Smoker    Retired 06/27/2019   Social Determinants of Health   Financial Resource Strain: Low Risk  (11/22/2022)   Overall Financial Resource Strain (CARDIA)    Difficulty of Paying Living Expenses: Not hard at all  Food Insecurity: No Food Insecurity (11/22/2022)   Hunger Vital Sign    Worried About Running Out of Food in the Last Year: Never true    Ran Out of Food in the Last Year: Never true  Transportation Needs: No Transportation Needs (11/22/2022)   PRAPARE - Administrator, Civil Service (Medical): No    Lack of Transportation (Non-Medical): No  Physical Activity: Sufficiently Active  (11/22/2022)   Exercise Vital Sign    Days of Exercise per Week: 3 days    Minutes of Exercise per Session: 60 min  Stress: No Stress Concern Present (11/22/2022)   [employer] of Occupational Health - Occupational Stress Questionnaire    Feeling of Stress : Not at all  Social Connections: Socially Integrated (11/22/2022)   Social Connection and Isolation Panel [NHANES]    Frequency of Communication with Friends and Family: Once a week    Frequency of Social Gatherings with Friends and Family: More than three times a week    Attends Religious Services: More than 4 times per year    Active Member of [employer] or Organizations: Yes    Attends Banker Meetings: Never    Marital Status: Married  Catering manager Violence: Not At Risk (11/22/2022)   Humiliation, Afraid, Rape, and Kick questionnaire    Fear of Current or Ex-Partner: No    Emotionally Abused: No    Physically Abused: No    Sexually Abused: No    Physical Exam      Future Appointments  Date Time Provider Department Center  01/31/2023  8:20 AM Dana Allan, MD [facility]  03/11/2023  7:00 AM [facility] DEVICE REMOTES [facility]  04/11/2023  2:00 PM Regan Lemming, MD [facility]  05/04/2023  8:45 AM McCaughan, Dia D, DPM [facility]  06/10/2023  7:00 AM [facility] DEVICE REMOTES [facility]  09/09/2023  7:00 AM [facility] DEVICE REMOTES [facility]  11/23/2023  8:30 AM [facility] ANNUAL WELLNESS VISIT [facility]  12/09/2023  7:00 AM [facility] DEVICE REMOTES [facility]  03/09/2024  7:00 AM [facility] DEVICE REMOTES [facility]  06/08/2024  7:00 AM [facility] DEVICE REMOTES [facility]

## 2023-01-27 NOTE — Telephone Encounter (Signed)
Heather with para medicine called to report low b/p reading  Reading today 80/48 1 hour after meds Standing b/p 90/54 Weight stable at 233 lbs -denies SOB, CP or dizziness  B/p 7/23 100/70 before meds -minimal dizziness noted with this reading  Above reviewed with Tonye Becket NP Hold evening dose of entresto  Send device transmission   Above given to [employer]

## 2023-01-28 ENCOUNTER — Encounter: Payer: Self-pay | Admitting: Pharmacist

## 2023-01-28 ENCOUNTER — Encounter: Payer: Self-pay | Admitting: Family Medicine

## 2023-01-28 ENCOUNTER — Other Ambulatory Visit (HOSPITAL_COMMUNITY): Payer: Self-pay

## 2023-01-28 DIAGNOSIS — G720 Drug-induced myopathy: Secondary | ICD-10-CM

## 2023-01-28 NOTE — Progress Notes (Signed)
[facility])                                            [facility] Quality Pharmacy Team                                        Statin Quality Measure Assessment    01/28/2023  John Keith April 02, 1951 [id number]  Per review of chart and payor information, patient has a diagnosis of cardiovascular disease but is not currently filling a statin prescription.  This places patient into the [facility] (Statin Use In Patients with Cardiovascular Disease) measure for CMS.    Patient has documented trials of statins with reported myopathy, but no corresponding CPT codes that would exclude patient from [facility] measure. He is on Ezetimibe.  Q76.1P5K was associated with a previous note but this code will not remove patients from the measure when used alone.  If deemed therapeutically appropriate, an approved statin exclusion code could be associated with the upcoming visit.       Component Value Date/Time   CHOL 164 10/29/2022 0953   TRIG 68.0 10/29/2022 0953   HDL 49.00 10/29/2022 0953   CHOLHDL 3 10/29/2022 0953   VLDL 13.6 10/29/2022 0953   LDLCALC 102 (H) 10/29/2022 0953   LDLDIRECT 108.0 05/15/2021 1403     Please consider ONE of the following recommendations:  Initiate high intensity statin Atorvastatin 40mg  once daily, #90, 3 refills   Rosuvastatin 20mg  once daily, #90, 3 refills    Initiate moderate intensity  statin with reduced frequency if prior  statin intolerance 1x weekly, #13, 3 refills   2x weekly, #26, 3 refills   3x weekly, #39, 3 refills    Code for past statin intolerance  (required annually)   Provider Requirements:  Must asociate code during an office visit or telehealth encounter   Drug Induced Myopathy G72.0   Myalgia M79.1   Myositis, unspecified M60.9   Myopathy, unspecified G72.9   Rhabdomyolysis M62.82     Plan:  Route note to Patient's Provider prior to appointment.    Beecher Mcardle, PharmD, BCACP [facility]  Clinical Pharmacist [phone]

## 2023-01-31 ENCOUNTER — Ambulatory Visit (INDEPENDENT_AMBULATORY_CARE_PROVIDER_SITE_OTHER): Payer: PPO | Admitting: Family Medicine

## 2023-01-31 ENCOUNTER — Telehealth (HOSPITAL_COMMUNITY): Payer: Self-pay | Admitting: Emergency Medicine

## 2023-01-31 ENCOUNTER — Encounter: Payer: Self-pay | Admitting: Family Medicine

## 2023-01-31 VITALS — BP 110/68 | HR 52 | Temp 97.7°F | Resp 16 | Ht >= 80 in | Wt 241.1 lb

## 2023-01-31 DIAGNOSIS — B351 Tinea unguium: Secondary | ICD-10-CM | POA: Diagnosis not present

## 2023-01-31 DIAGNOSIS — R7309 Other abnormal glucose: Secondary | ICD-10-CM

## 2023-01-31 DIAGNOSIS — I483 Typical atrial flutter: Secondary | ICD-10-CM | POA: Diagnosis not present

## 2023-01-31 DIAGNOSIS — I5042 Chronic combined systolic (congestive) and diastolic (congestive) heart failure: Secondary | ICD-10-CM | POA: Diagnosis not present

## 2023-01-31 DIAGNOSIS — E785 Hyperlipidemia, unspecified: Secondary | ICD-10-CM

## 2023-01-31 DIAGNOSIS — L309 Dermatitis, unspecified: Secondary | ICD-10-CM | POA: Diagnosis not present

## 2023-01-31 DIAGNOSIS — D6869 Other thrombophilia: Secondary | ICD-10-CM | POA: Diagnosis not present

## 2023-01-31 DIAGNOSIS — I472 Ventricular tachycardia, unspecified: Secondary | ICD-10-CM

## 2023-01-31 DIAGNOSIS — I10 Essential (primary) hypertension: Secondary | ICD-10-CM

## 2023-01-31 DIAGNOSIS — I700 Atherosclerosis of aorta: Secondary | ICD-10-CM | POA: Diagnosis not present

## 2023-01-31 DIAGNOSIS — I5022 Chronic systolic (congestive) heart failure: Secondary | ICD-10-CM

## 2023-01-31 MED ORDER — TRIAMCINOLONE ACETONIDE 0.1 % EX CREA
1.0000 | TOPICAL_CREAM | Freq: Two times a day (BID) | CUTANEOUS | 0 refills | Status: DC
Start: 2023-01-31 — End: 2023-04-11

## 2023-01-31 MED ORDER — TERBINAFINE HCL 1 % EX CREA
1.0000 | TOPICAL_CREAM | Freq: Two times a day (BID) | CUTANEOUS | 0 refills | Status: DC
Start: 2023-01-31 — End: 2023-02-17

## 2023-01-31 NOTE — Telephone Encounter (Signed)
Called Mr. John Keith to follow up on symptoms and BP readings from Thursday. Mr. John Keith denied any dizzy spells and reported all normotensive blood pressures. Informed him on progress of APPS appointment and appointment with Dr. Gala Romney.    [name] Setting EMT-P Community Paramedic  [phone]

## 2023-01-31 NOTE — Progress Notes (Signed)
SUBJECTIVE:   Chief Complaint  Patient presents with   Medical Management of Chronic Issues   HPI Presents to clinic for follow up chronic disease management  Pruritis Endorses right upper back itching.  Has been ongoing since had shingles back in 2021.  Was previously prescribed cream that helped and is requesting refill.  Denies any fevers, blisters, burning, rash or change in detergents, creams or environment.  Right thumb discoloration Noticed couple of days ago.  No pain, itching, or discomfort.   Hypertension/HFrEF/CAD/A-flutter/history of VT Doing well.  Compliant with current medication and follows with Cardiology.  Denies any chest pain, shortness of breath, heart palpitations or lower extremity swelling. No signs of bleeding. No changes in medications.    BPH Asymptomatic.  Recent PSA normal.     PERTINENT PMH / PSH: Hypertension CAD A-flutter on Eliquis VT HFrEF DDD BPH  OBJECTIVE:  BP 110/68   Pulse (!) 52   Temp 97.7 F (36.5 C)   Resp 16   Ht 6\' 9"  (2.057 m)   Wt 241 lb 2 oz (109.4 kg)   SpO2 96%   BMI 25.84 kg/m    Physical Exam Vitals reviewed.  Constitutional:      General: He is not in acute distress.    Appearance: Normal appearance. He is normal weight. He is not ill-appearing, toxic-appearing or diaphoretic.  Eyes:     General:        Right eye: No discharge.        Left eye: No discharge.  Cardiovascular:     Rate and Rhythm: Regular rhythm. Bradycardia present.     Heart sounds: Normal heart sounds.  Pulmonary:     Effort: Pulmonary effort is normal.     Breath sounds: Normal breath sounds.  Abdominal:     General: Bowel sounds are normal.  Musculoskeletal:        General: Normal range of motion.     Cervical back: Normal range of motion.  Skin:    General: Skin is warm and dry.  Neurological:     Mental Status: He is alert and oriented to person, place, and time. Mental status is at baseline.  Psychiatric:        Mood and  Affect: Mood normal.        Behavior: Behavior normal.        Thought Content: Thought content normal.        Judgment: Judgment normal.     ASSESSMENT/PLAN:  Onychomycosis Assessment & Plan: Thickened and discolored area of right nail Terbinafine 1% cream x 14 days If no improvement return to clinic    Orders: -     Terbinafine HCl; Apply 1 Application topically 2 (two) times daily.  Dispense: 30 g; Refill: 0  Chronic combined systolic and diastolic CHF (congestive heart failure) (HCC) Assessment & Plan: Chronic.  Asymptomatic and euvolemic on exam. Continue GDMT Continue to follow-up with cardiology as scheduled.   Aortic atherosclerosis (HCC) Assessment & Plan: Chronic.  Recent LDL 102 Continue Zetia 10 mg daily    Essential hypertension Assessment & Plan: Chronic.  Well controlled Continue Coreg 6.25 mg twice daily Continue Aldactone 25 mg daily Follow-up with cardiology as scheduled.   Dermatitis Assessment & Plan: No visible rash on posterior right upper back Start triamcinolone 1% ointment BID x 5 days then as needed If no improvement return to clinic  Orders: -     Triamcinolone Acetonide; Apply 1 Application topically 2 (two) times daily.  Dispense: 30 g; Refill: 0  Typical atrial flutter (HCC) Assessment & Plan: Chronic.  History of a flutter on Eliquis Rate controlled Continue carvedilol 6.25 mg twice daily Continue Eliquis 5 mg BID Follows with cardiology   Secondary hypercoagulable state [facility]) Assessment & Plan: Chronic use of Eliquis for Afib, rate controlled No signs of bleeding   Ventricular tachycardia (HCC) Assessment & Plan: Doing well ICD  Follows with Cardiology    PDMP reviewed  Return in about 6 months (around 08/03/2023), or if symptoms worsen or fail to improve, for PCP.  Dana Allan, MD

## 2023-01-31 NOTE — Assessment & Plan Note (Signed)
Chronic use of Eliquis for Afib, rate controlled No signs of bleeding

## 2023-01-31 NOTE — Patient Instructions (Addendum)
It was a pleasure meeting you today. Thank you for allowing me to take part in your health care.  Our goals for today as we discussed include:  Recommend Shingles vaccine.  This is a 2 dose series and can be given at your local pharmacy.  Please talk to your pharmacist about this.    Use Terbinafine twice a day on right thumb nail for 14 days.  For fungal infection. If no improvement follow up with PCP  Use triamcinolone cream on back for itching two times a day for 5 days as needed  If you have any questions or concerns, please do not hesitate to call the office at [phone].  I look forward to our next visit and until then take care and stay safe.  Regards,   Dana Allan, MD   [facility]

## 2023-01-31 NOTE — Assessment & Plan Note (Signed)
Thickened and discolored area of right nail Terbinafine 1% cream x 14 days If no improvement return to clinic

## 2023-01-31 NOTE — Assessment & Plan Note (Signed)
Chronic.  History of a flutter on Eliquis Rate controlled Continue carvedilol 6.25 mg twice daily Continue Eliquis 5 mg BID Follows with cardiology

## 2023-01-31 NOTE — Assessment & Plan Note (Addendum)
Doing well ICD  Follows with Cardiology

## 2023-01-31 NOTE — Assessment & Plan Note (Signed)
Chronic.  Well controlled Continue Coreg 6.25 mg twice daily Continue Aldactone 25 mg daily Follow-up with cardiology as scheduled.

## 2023-01-31 NOTE — Assessment & Plan Note (Signed)
No visible rash on posterior right upper back Start triamcinolone 1% ointment BID x 5 days then as needed If no improvement return to clinic

## 2023-01-31 NOTE — Assessment & Plan Note (Signed)
Chronic.  Recent LDL 102 Continue Zetia 10 mg daily

## 2023-01-31 NOTE — Assessment & Plan Note (Signed)
Chronic.  Asymptomatic and euvolemic on exam. Continue GDMT Continue to follow-up with cardiology as scheduled.

## 2023-02-01 ENCOUNTER — Other Ambulatory Visit (HOSPITAL_COMMUNITY): Payer: Self-pay | Admitting: Physician Assistant

## 2023-02-02 ENCOUNTER — Other Ambulatory Visit (HOSPITAL_COMMUNITY): Payer: Self-pay

## 2023-02-02 DIAGNOSIS — R09A2 Foreign body sensation, throat: Secondary | ICD-10-CM | POA: Diagnosis not present

## 2023-02-02 DIAGNOSIS — R040 Epistaxis: Secondary | ICD-10-CM | POA: Diagnosis not present

## 2023-02-02 DIAGNOSIS — J349 Unspecified disorder of nose and nasal sinuses: Secondary | ICD-10-CM | POA: Diagnosis not present

## 2023-02-02 MED ORDER — CARVEDILOL 6.25 MG PO TABS: 6.2500 mg | ORAL_TABLET | Freq: Two times a day (BID) | ORAL | 3 refills | Status: DC

## 2023-02-03 ENCOUNTER — Other Ambulatory Visit (HOSPITAL_COMMUNITY): Payer: Self-pay

## 2023-02-03 NOTE — Progress Notes (Signed)
Paramedicine Encounter    Patient ID: John Keith, male    DOB: 03-29-1951, 72 y.o.   MRN: [id number]   Complaints- none   Assessment- CAOX4, warm and dry, no shortness of breath, no dizziness, no chest pain, no swelling, no low BP readings. Lungs clear.   Compliance with meds- no missed doses   Pill box filled- for two weeks   Refills needed- zetia (next week)  Meds changes since last visit- none     Social changes- none    VISIT SUMMARY- Arrived for home visit for John Keith who reports to be feeling well this week. He denied any dizziness, shortness of breath, chest pain, swelling. We has been keeping track of his BP readings over the last week and he has none drop below 120 systolic. He has reported no symptoms and has been taking his medications appropriately. We reviewed medications and confirmed same filling pill box for two weeks. I provided him with a weight sheet and a BP log for him to document his BP's over the next two weeks- he knows if they drop or he has symptoms to call me and we will come out and evaluate and create a plan. He agreed with this and was made aware of his upcoming appointment in the clinic in two weeks where I will meet him for our final visit before [name] takes over paramedicine care. Home visit complete.   BP 120/60   Pulse (!) 56   Resp 16   Wt 237 lb 3.2 oz (107.6 kg)   SpO2 96%   BMI 25.42 kg/m  Weight yesterday-236lbs Last visit weight-- 233lbs     ACTION: Home visit completed     Patient Care Team: Dana Allan, MD as PCP - General (Family Medicine) Pincus Sanes, MD as Consulting Physician (Internal Medicine) Chilton Si, MD as Attending Physician (Cardiology) Regan Lemming, MD as Consulting Physician (Cardiology)  Patient Active Problem List   Diagnosis Date Noted   Dermatitis 01/31/2023   Onychomycosis 01/31/2023   Statin myopathy 11/15/2022   Abnormal glucose 11/15/2022   Prostate cancer screening  11/15/2022   Chronic systolic heart failure (HCC) 12/15/2021   Chronic combined systolic and diastolic CHF (congestive heart failure) (HCC) 12/15/2021   Nasal sore 12/15/2021   Syncope 12/07/2021   Ventricular tachycardia (HCC) 12/07/2021   Typical atrial flutter (HCC) 09/24/2021   Secondary hypercoagulable state (HCC) 09/24/2021   Abnormal x-ray of neck 06/16/2021   Abnormal MRI, lumbar spine 10/23/2020   Chronic low back pain 10/23/2020   Neuropathy 10/23/2020   Coronary artery disease of native artery of native heart with stable angina pectoris (HCC) 10/23/2020   Impingement syndrome of left shoulder region 08/22/2020   Pain in joint of left shoulder 07/24/2020   Chronic left shoulder pain 07/11/2020   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   Anemia 12/13/2019   Chest pain of uncertain etiology 10/24/2019   PVC (premature ventricular contraction) 10/24/2019   Gastric ulcer without hemorrhage or perforation 09/28/2019   COVID-19 virus detected 07/12/2019   Elevated troponin 07/07/2019   Elevated serum protein level 07/07/2019   Postherpetic neuralgia 08/17/2018   Benign prostatic hyperplasia 08/01/2018   Hip pain 06/29/2018   Aortic atherosclerosis (HCC) 09/22/2016   Fatigue 06/03/2016   Lower urinary tract symptoms (LUTS) 06/03/2016   Constipation 03/10/2016   Lumbar radiculopathy 02/20/2016   Encounter for immunization 11/26/2015   Insomnia 06/10/2015   External hemorrhoid 03/07/2015   Health care maintenance 01/09/2014   Carpal  tunnel syndrome of left wrist 01/03/2013   Hyperlipidemia 11/09/2012   Vitamin D deficiency 11/09/2012   Essential hypertension 11/06/2012   Tobacco abuse 11/06/2012   Seasonal allergies 11/06/2012   Erectile dysfunction 11/06/2012   GERD (gastroesophageal reflux disease) 11/06/2012   DDD (degenerative disc disease), thoracolumbar 11/06/2012   Lactose intolerance 11/06/2012   Diverticulosis of colon without hemorrhage 11/06/2012   Diverticulosis  of colon 11/06/2012   Degeneration of thoracolumbar intervertebral disc 11/06/2012   Gastroesophageal reflux disease 11/06/2012   Tobacco user 11/06/2012    Current Outpatient Medications:    amiodarone (PACERONE) 200 MG tablet, TAKE 1 TABLET(200 MG) BY MOUTH DAILY, Disp: 30 tablet, Rfl: 5   apixaban (ELIQUIS) 5 MG TABS tablet, Take 1 tablet (5 mg total) by mouth 2 (two) times daily., Disp: 60 tablet, Rfl: 3   calcium carbonate (OS-CAL - DOSED IN MG OF ELEMENTAL CALCIUM) 1250 (500 Ca) MG tablet, Take 1 tablet (500 mg of elemental calcium total) by mouth daily with breakfast., Disp: 30 tablet, Rfl: 11   carvedilol (COREG) 6.25 MG tablet, Take 1 tablet (6.25 mg total) by mouth 2 (two) times daily with a meal., Disp: 180 tablet, Rfl: 3   Cholecalciferol (VITAMIN D-3) 125 MCG (5000 UT) TABS, Take 1 tablet by mouth daily., Disp: 30 tablet, Rfl: 11   ezetimibe (ZETIA) 10 MG tablet, Take 1 tablet (10 mg total) by mouth daily., Disp: 90 tablet, Rfl: 3   FARXIGA 10 MG TABS tablet, Take 1 tablet (10 mg total) by mouth daily., Disp: 90 tablet, Rfl: 3   ferrous sulfate 325 (65 FE) MG tablet, Take 325 mg by mouth daily with breakfast., Disp: , Rfl:    folic acid (FOLVITE) 1 MG tablet, TAKE 1 TABLET(1 MG) BY MOUTH DAILY, Disp: 30 tablet, Rfl: 11   gabapentin (NEURONTIN) 300 MG capsule, Take 3 capsules (900 mg total) by mouth daily., Disp: 270 capsule, Rfl: 3   methotrexate (RHEUMATREX) 2.5 MG tablet, Take 20 mg (8 tablets) every Wednesday. PROTECT FROM LIGHT AS DIRECTED, Disp: 150 tablet, Rfl: 0   Multiple Vitamin (MULTIVITAMIN) tablet, Take 1 tablet by mouth daily., Disp: , Rfl:    mupirocin ointment (BACTROBAN) 2 %, , Disp: , Rfl:    pantoprazole (PROTONIX) 40 MG tablet, Take 1 tablet (40 mg total) by mouth daily. 30 min before food, Disp: 90 tablet, Rfl: 3   sacubitril-valsartan (ENTRESTO) 24-26 MG, Take 1 tablet by mouth 2 (two) times daily., Disp: 180 tablet, Rfl: 3   spironolactone (ALDACTONE) 25 MG  tablet, Take 1 tablet (25 mg total) by mouth daily., Disp: 90 tablet, Rfl: 0   terbinafine (LAMISIL) 1 % cream, Apply 1 Application topically 2 (two) times daily., Disp: 30 g, Rfl: 0   triamcinolone cream (KENALOG) 0.1 %, Apply 1 Application topically 2 (two) times daily., Disp: 30 g, Rfl: 0 Allergies  Allergen Reactions   Crestor [Rosuvastatin Calcium] Other (See Comments)    Elevated muscle enzymes    Lipitor [Atorvastatin Calcium] Other (See Comments)    Elevated muscle enzymes      Social History   Socioeconomic History   Marital status: Married    Spouse name: Gwendolyn   Number of children: 2   Years of education: Not on file   Highest education level: High school graduate  Occupational History   Occupation: Retired    Comment: Semi  Tobacco Use   Smoking status: Former    Current packs/day: 0.00    Average packs/day: 0.5 packs/day for 49.0  years (24.5 ttl pk-yrs)    Types: Cigarettes    Start date: 07/06/1971    Quit date: 06/26/2020    Years since quitting: 2.6   Smokeless tobacco: Never   Tobacco comments:    Former smoker 09/24/21  Vaping Use   Vaping status: Never Used  Substance and Sexual Activity   Alcohol use: No    Alcohol/week: 0.0 standard drinks of alcohol   Drug use: No   Sexual activity: Not on file  Other Topics Concern   Not on file  Social History Narrative   Married    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    On child    1 son, 1 daughter   Machine op   Owns guns    Wears seat belt, safe in relationship    Smoker    Retired 06/27/2019   Social Determinants of Health   Financial Resource Strain: Low Risk  (11/22/2022)   Overall Financial Resource Strain (CARDIA)    Difficulty of Paying Living Expenses: Not hard at all  Food Insecurity: No Food Insecurity (11/22/2022)   Hunger Vital Sign    Worried About Running Out of Food in the Last Year: Never true    Ran Out of Food in the Last Year: Never true  Transportation Needs: No Transportation Needs (11/22/2022)    PRAPARE - Administrator, Civil Service (Medical): No    Lack of Transportation (Non-Medical): No  Physical Activity: Sufficiently Active (11/22/2022)   Exercise Vital Sign    Days of Exercise per Week: 3 days    Minutes of Exercise per Session: 60 min  Stress: No Stress Concern Present (11/22/2022)   [employer] of Occupational Health - Occupational Stress Questionnaire    Feeling of Stress : Not at all  Social Connections: Socially Integrated (11/22/2022)   Social Connection and Isolation Panel [NHANES]    Frequency of Communication with Friends and Family: Once a week    Frequency of Social Gatherings with Friends and Family: More than three times a week    Attends Religious Services: More than 4 times per year    Active Member of [employer] or Organizations: Yes    Attends Banker Meetings: Never    Marital Status: Married  Catering manager Violence: Not At Risk (11/22/2022)   Humiliation, Afraid, Rape, and Kick questionnaire    Fear of Current or Ex-Partner: No    Emotionally Abused: No    Physically Abused: No    Sexually Abused: No    Physical Exam      Future Appointments  Date Time Provider Department Center  02/17/2023 10:30 AM [facility] PA/NP [facility] None  03/11/2023  7:00 AM [facility] DEVICE REMOTES [facility]  04/11/2023  2:00 PM Camnitz, Andree Coss, MD [facility]  05/04/2023  8:45 AM McCaughan, Dia D, DPM [facility]  06/10/2023  7:00 AM [facility] DEVICE REMOTES [facility]  08/04/2023  8:40 AM Dana Allan, MD [facility]  09/09/2023  7:00 AM [facility] DEVICE REMOTES [facility]  11/23/2023  8:30 AM [facility] ANNUAL WELLNESS VISIT [facility]  12/09/2023  7:00 AM [facility] DEVICE REMOTES [facility]  03/09/2024  7:00 AM [facility] DEVICE REMOTES [facility]  06/08/2024  7:00 AM [facility] DEVICE REMOTES [facility]

## 2023-02-08 ENCOUNTER — Encounter: Payer: Self-pay | Admitting: Pharmacist

## 2023-02-08 NOTE — Progress Notes (Signed)
Pharmacy Quality Measure Review  This patient is appearing on report for being at risk of failing the adherence measure for Statin Therapy for Patients with Cardiovascular Disease [facility]) medications this calendar year.   Prior trials of: rosuvastatin and atorvastatin with myalgias.   Will collaborate with provider to use statin myalgia exclusion code (G72.0, T46.6X5A).   Catie Eppie Gibson, PharmD, BCACP, CPP Clinical Pharmacist [facility] [phone]

## 2023-02-15 ENCOUNTER — Encounter (HOSPITAL_COMMUNITY): Payer: PPO

## 2023-02-17 ENCOUNTER — Other Ambulatory Visit (HOSPITAL_COMMUNITY): Payer: Self-pay | Admitting: Emergency Medicine

## 2023-02-17 ENCOUNTER — Encounter (HOSPITAL_COMMUNITY): Payer: Self-pay

## 2023-02-17 ENCOUNTER — Ambulatory Visit (HOSPITAL_COMMUNITY)
Admission: RE | Admit: 2023-02-17 | Discharge: 2023-02-17 | Disposition: A | Discharge status: Home / Self Care | Payer: PPO | Source: Ambulatory Visit | Attending: Cardiology | Admitting: Cardiology

## 2023-02-17 VITALS — BP 120/68 | HR 57 | Wt 243.4 lb

## 2023-02-17 DIAGNOSIS — Z7984 Long term (current) use of oral hypoglycemic drugs: Secondary | ICD-10-CM | POA: Insufficient documentation

## 2023-02-17 DIAGNOSIS — I2510 Atherosclerotic heart disease of native coronary artery without angina pectoris: Secondary | ICD-10-CM | POA: Diagnosis not present

## 2023-02-17 DIAGNOSIS — Z8249 Family history of ischemic heart disease and other diseases of the circulatory system: Secondary | ICD-10-CM | POA: Diagnosis not present

## 2023-02-17 DIAGNOSIS — Z87891 Personal history of nicotine dependence: Secondary | ICD-10-CM | POA: Diagnosis not present

## 2023-02-17 DIAGNOSIS — I4892 Unspecified atrial flutter: Secondary | ICD-10-CM | POA: Insufficient documentation

## 2023-02-17 DIAGNOSIS — Z95810 Presence of automatic (implantable) cardiac defibrillator: Secondary | ICD-10-CM | POA: Diagnosis not present

## 2023-02-17 DIAGNOSIS — I428 Other cardiomyopathies: Secondary | ICD-10-CM | POA: Diagnosis not present

## 2023-02-17 DIAGNOSIS — Z7901 Long term (current) use of anticoagulants: Secondary | ICD-10-CM | POA: Diagnosis not present

## 2023-02-17 DIAGNOSIS — Z79899 Other long term (current) drug therapy: Secondary | ICD-10-CM | POA: Insufficient documentation

## 2023-02-17 DIAGNOSIS — I110 Hypertensive heart disease with heart failure: Secondary | ICD-10-CM | POA: Diagnosis not present

## 2023-02-17 DIAGNOSIS — I5022 Chronic systolic (congestive) heart failure: Secondary | ICD-10-CM | POA: Diagnosis not present

## 2023-02-17 LAB — COMPREHENSIVE METABOLIC PANEL
ALT: 26 U/L (ref 0–44)
AST: 29 U/L (ref 15–41)
Albumin: 3.6 g/dL (ref 3.5–5.0)
Alkaline Phosphatase: 72 U/L (ref 38–126)
Anion gap: 7 (ref 5–15)
BUN: 13 mg/dL (ref 8–23)
CO2: 27 mmol/L (ref 22–32)
Calcium: 9.1 mg/dL (ref 8.9–10.3)
Chloride: 105 mmol/L (ref 98–111)
Creatinine, Ser: 1.49 mg/dL — ABNORMAL HIGH (ref 0.61–1.24)
GFR, Estimated: 49 mL/min — ABNORMAL LOW (ref >60)
Glucose, Bld: 91 mg/dL (ref 70–99)
Potassium: 4.1 mmol/L (ref 3.5–5.1)
Sodium: 139 mmol/L (ref 135–145)
Total Bilirubin: 0.5 mg/dL (ref 0.3–1.2)
Total Protein: 6.9 g/dL (ref 6.5–8.1)

## 2023-02-17 LAB — TSH: TSH: 0.809 u[IU]/mL (ref 0.350–4.500)

## 2023-02-17 LAB — CBC
HCT: 39.8 % (ref 39.0–52.0)
Hemoglobin: 12.7 g/dL — ABNORMAL LOW (ref 13.0–17.0)
MCH: 32.4 pg (ref 26.0–34.0)
MCHC: 31.9 g/dL (ref 30.0–36.0)
MCV: 101.5 fL — ABNORMAL HIGH (ref 80.0–100.0)
Platelets: 243 10*3/uL (ref 150–400)
RBC: 3.92 MIL/uL — ABNORMAL LOW (ref 4.22–5.81)
RDW: 14.1 % (ref 11.5–15.5)
WBC: 5.1 10*3/uL (ref 4.0–10.5)
nRBC: 0 % (ref 0.0–0.2)

## 2023-02-17 NOTE — Patient Instructions (Addendum)
Medication Changes:  No Changes In Medications at this time.   Lab Work:  Labs done today, your results will be available in MyChart, we will contact you for abnormal readings.   Follow-Up in: 3-4 months with Dr. Gala Romney PLEASE CALL OUR OFFICE AROUND mid September TO GET SCHEDULED FOR YOUR APPOINTMENT. PHONE NUMBER IS [phone] OPTION 2   At the [facility], you and your health needs are our priority. We have a designated team specialized in the treatment of Heart Failure. This Care Team includes your primary Heart Failure Specialized Cardiologist (physician), Advanced Practice Providers (APPs- Physician Assistants and Nurse Practitioners), and Pharmacist who all work together to provide you with the care you need, when you need it.   You may see any of the following providers on your designated Care Team at your next follow up:  Dr. Arvilla Meres Dr. Marca Ancona Dr. Marcos Eke, NP Robbie Lis, [place] [facility] [place], [place] Brynda Peon, NP Karle Plumber, PharmD   Please be sure to bring in all your medications bottles to every appointment.   Need to Contact us:  If you have any questions or concerns before your next appointment please send [place] a message through [employer] or call our office at [phone].    TO LEAVE A MESSAGE FOR THE NURSE SELECT OPTION 2, PLEASE LEAVE A MESSAGE INCLUDING: YOUR NAME DATE OF BIRTH CALL BACK NUMBER REASON FOR CALL**this is important as we prioritize the call backs  YOU WILL RECEIVE A CALL BACK THE SAME DAY AS LONG AS YOU CALL BEFORE 4:00 PM

## 2023-02-17 NOTE — Progress Notes (Signed)
Paramedicine Encounter   Patient ID: John Keith , male,   DOB: 01/26/1951,72 y.o.,  MRN: [id number]   Met Mr. John Keith in clinic today with Boyce Medici, PA. Pt denied any complaints but wanted to follow up after CT. Pt denied any dizziness, shortness of breath, chest pain or weakness. Pt advised that he has been maintaining his lifestyle without difficulty. [place] advised to continue the methotrexate as prescribed. [place] advised to continue paramedicine Bi weekly and follow up with DB in 3-4 months. Pill box filled for 2x weeks. No upcoming appointments to review. Will follow up in the home in 2x weeks.     Weight @ clinic - 243lbs B/P- 120/68 P- 57 [id number]%  Med changes- no med changes made  [name] EMT-P Community Paramedic  [phone]

## 2023-02-17 NOTE — Progress Notes (Signed)
ADVANCED HF CLINIC NOTE   PCP: Dr. Dana Allan HF Cardiologist: Dr. Gala Romney  HPI: 72 y.o. 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    He established with Afib clinic in March for atrial flutter. He underwent successful DCCV 04/17. Despite restoration of SR he noted recurrent palpitations, lightheadedness and chest discomfort at f/u 05/15. 14 day Zio placed. Seen in Cardiology clinic 12/03/21 with palpitations, recurrent near syncope and a syncopal episode. BP meds reduced. Read on 14 day Zio was expedited and showed sustained episodes of VT (longest 1 minute 30 seconds).    Patient was referred to ED for admission d/t recurrent VT on monitor. EP was consulted. Echo showed EF 35-40%, grade II DD, RV mildly reduced, RVSP 27 mmHg. Cardiac MRI: EF 33%, RVEF 40%, with multifocal areas of LGE with high intensity in basal septal LGE, elevated T2 values in lateral wall concerning for myocardial inflammation, concerning for sarcoidosis. LHC showed mild nonobstructive CAD. He was started on amiodarone and underwent ICD placement 12/10/21. Workup including PET for cardiac sarcoid recommended as outpatient.   NM cardiac amyloid tumor LOC: Visual and quantitative assessment (grade 1, H/CL equal 1.28) is equivocally suggestive of transthyretin amyloidosis.   Follow up 7/23 with Dr. Gala Romney. NYHA II, volume stable. Low dose spiro started.   Cardiac PET at [facility] (8/23) without evidence of cardiac sarcoidosis or inflammation .   Follow up 11/23, was doing well and had completed prednisone.   7/23 had repeat PET showing no evidence of sarcoid.   He presents today for routine f/u. Here w/ wife and paramedicine. Doing well symptomatically. Stable NYHA Class II symptoms. Compliant w/ meds. Continues on maintenance dose MXT.  Device interrogation shows no VT and no AF. Stable thoracic impedence. Activity  level 2-3 hr/day. BP well controlled. Has 1 incidence of low BP several weeks ago but asymptomatic and no recurrence. 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     Cardiac Studies: - Repeat Cardiac PET (post pred/MXT regimen) 7/24 no evidence of sarcoid.  - Cardiac PET at [facility] (8/23) EF 43% without evidence of cardiac sarcoidosis or inflammation.   - PYP (6/23): Visual and quantitative assessment (grade 1, H/CL equal 1.28) is equivocally suggestive of transthyretin amyloidosis.  - LHC (6/23): mild, non-obs CAD.    - cMRI (6/23): LVEF 33%, RVEF 40%, with multifocal areas of LGE with high intensity in basal septal LGE, elevated T2 values in lateral wall concerning for myocardial inflammation, concerning for sarcoidosis.   - Echo (6/23): EF 35-40%, grade II DD, RV mildly reduced, RVSP 27 mmHg.  - Zio 14 day (6/23): mostly NSR, multiple episodes of wide-complex tachycardia due to VT  Past Medical History:  Diagnosis Date   Allergy    Arthritis    Atrial flutter (HCC)    Carpal tunnel syndrome on left    Chest pain of uncertain etiology 10/24/2019   COVID-19    07/07/19   DDD (degenerative disc disease), thoracolumbar    multilevel   Degenerative joint disease of left shoulder    02/2011    Diverticulosis    left colon (2008)    ED (erectile dysfunction)    02/2011    GERD (gastroesophageal reflux disease)    H. pylori infection    Hyperlipidemia    Hypertension    Insomnia    Lactose intolerance    Low back pain  PVC (premature ventricular contraction) 10/24/2019   Shingles    07/2018   Smoking    smoking since age 61 y.o   Toe fracture, left    4th in 2014   Vitamin D deficiency    Current Outpatient Medications  Medication Sig Dispense Refill   amiodarone (PACERONE) 200 MG tablet TAKE 1 TABLET(200 MG) BY MOUTH DAILY 30 tablet 5   apixaban (ELIQUIS) 5 MG TABS tablet Take 1 tablet (5 mg total) by mouth 2 (two) times daily. 60 tablet 3   calcium carbonate (OS-CAL  - DOSED IN MG OF ELEMENTAL CALCIUM) 1250 (500 Ca) MG tablet Take 1 tablet (500 mg of elemental calcium total) by mouth daily with breakfast. 30 tablet 11   carvedilol (COREG) 6.25 MG tablet Take 1 tablet (6.25 mg total) by mouth 2 (two) times daily with a meal. 180 tablet 3   Cholecalciferol (VITAMIN D-3) 125 MCG (5000 UT) TABS Take 1 tablet by mouth daily. 30 tablet 11   ezetimibe (ZETIA) 10 MG tablet Take 1 tablet (10 mg total) by mouth daily. 90 tablet 3   FARXIGA 10 MG TABS tablet Take 1 tablet (10 mg total) by mouth daily. 90 tablet 3   ferrous sulfate 325 (65 FE) MG tablet Take 325 mg by mouth daily with breakfast.     folic acid (FOLVITE) 1 MG tablet TAKE 1 TABLET(1 MG) BY MOUTH DAILY 30 tablet 11   gabapentin (NEURONTIN) 300 MG capsule Take 3 capsules (900 mg total) by mouth daily. 270 capsule 3   methotrexate (RHEUMATREX) 2.5 MG tablet Take 20 mg (8 tablets) every Wednesday. PROTECT FROM LIGHT AS DIRECTED 150 tablet 0   Multiple Vitamin (MULTIVITAMIN) tablet Take 1 tablet by mouth daily.     mupirocin ointment (BACTROBAN) 2 % Apply 1 Application topically as needed.     pantoprazole (PROTONIX) 40 MG tablet Take 1 tablet (40 mg total) by mouth daily. 30 min before food 90 tablet 3   sacubitril-valsartan (ENTRESTO) 24-26 MG Take 1 tablet by mouth 2 (two) times daily. 180 tablet 3   spironolactone (ALDACTONE) 25 MG tablet Take 1 tablet (25 mg total) by mouth daily. 90 tablet 0   triamcinolone cream (KENALOG) 0.1 % Apply 1 Application topically 2 (two) times daily. (Patient taking differently: Apply 1 Application topically 2 (two) times daily. As needed) 30 g 0   No current facility-administered medications for this encounter.   Allergies  Allergen Reactions   Crestor [Rosuvastatin Calcium] Other (See Comments)    Elevated muscle enzymes    Lipitor [Atorvastatin Calcium] Other (See Comments)    Elevated muscle enzymes    Social History   Socioeconomic History   Marital status:  Married    Spouse name: Gwendolyn   Number of children: 2   Years of education: Not on file   Highest education level: High school graduate  Occupational History   Occupation: Retired    Comment: Semi  Tobacco Use   Smoking status: Former    Current packs/day: 0.00    Average packs/day: 0.5 packs/day for 49.0 years (24.5 ttl pk-yrs)    Types: Cigarettes    Start date: 07/06/1971    Quit date: 06/26/2020    Years since quitting: 2.6   Smokeless tobacco: Never   Tobacco comments:    Former smoker 09/24/21  Vaping Use   Vaping status: Never Used  Substance and Sexual Activity   Alcohol use: No    Alcohol/week: 0.0 standard drinks of alcohol  Drug use: No   Sexual activity: Not on file  Other Topics Concern   Not on file  Social History Narrative   Married    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    On child    1 son, 1 daughter   Machine op   Owns guns    Wears seat belt, safe in relationship    Smoker    Retired 06/27/2019   Social Determinants of Health   Financial Resource Strain: Low Risk  (11/22/2022)   Overall Financial Resource Strain (CARDIA)    Difficulty of Paying Living Expenses: Not hard at all  Food Insecurity: No Food Insecurity (11/22/2022)   Hunger Vital Sign    Worried About Running Out of Food in the Last Year: Never true    Ran Out of Food in the Last Year: Never true  Transportation Needs: No Transportation Needs (11/22/2022)   PRAPARE - Administrator, Civil Service (Medical): No    Lack of Transportation (Non-Medical): No  Physical Activity: Sufficiently Active (11/22/2022)   Exercise Vital Sign    Days of Exercise per Week: 3 days    Minutes of Exercise per Session: 60 min  Stress: No Stress Concern Present (11/22/2022)   [employer] of Occupational Health - Occupational Stress Questionnaire    Feeling of Stress : Not at all  Social Connections: Socially Integrated (11/22/2022)   Social Connection and Isolation Panel [NHANES]    Frequency of  Communication with Friends and Family: Once a week    Frequency of Social Gatherings with Friends and Family: More than three times a week    Attends Religious Services: More than 4 times per year    Active Member of [employer] or Organizations: Yes    Attends Banker Meetings: Never    Marital Status: Married  Catering manager Violence: Not At Risk (11/22/2022)   Humiliation, Afraid, Rape, and Kick questionnaire    Fear of Current or Ex-Partner: No    Emotionally Abused: No    Physically Abused: No    Sexually Abused: No   Family History  Problem Relation Age of Onset   Heart disease Mother        age 62    Throat cancer Father 51   Cancer Father        throat    Cancer Maternal Uncle        uncle ?maternal or paternal died colon cancer in his 2s    Diabetes Maternal Aunt    Heart attack Maternal Grandmother   - His mother and grandmother had heart issues, uncertain if they had CHF.  BP 120/68   Pulse (!) 57   Wt 110.4 kg (243 lb 6.4 oz)   SpO2 95%   BMI 26.08 kg/m   Wt Readings from Last 3 Encounters:  02/17/23 110.4 kg (243 lb 6.4 oz)  02/03/23 107.6 kg (237 lb 3.2 oz)  01/31/23 109.4 kg (241 lb 2 oz)   PHYSICAL EXAM: General:  Well appearing. No respiratory difficulty HEENT: normal Neck: supple. no JVD. Carotids 2+ bilat; no bruits. No lymphadenopathy or thyromegaly appreciated. Cor: PMI nondisplaced. Regular rate & rhythm. No rubs, gallops or murmurs. Lungs: clear Abdomen: soft, nontender, nondistended. No hepatosplenomegaly. No bruits or masses. Good bowel sounds. Extremities: no cyanosis, clubbing, rash, edema Neuro: alert & oriented x 3, cranial nerves grossly intact. moves all 4 extremities w/o difficulty. Affect pleasant.   ECG: not performed    MDT ICD interrogation (personally reviewed): OptiVol  ok. Stable thoracic impedence. No VT/AF.  Activity ~2 hrs/day.   ASSESSMENT & PLAN: Chronic systolic CHF/NICM: - Echo (6/23): EF 35-40%, grade II DD,  RV mildly reduced, RVSP 27 mmHg. - Cardiac MRI (6/23): EF 33%, RVEF 40%, with multifocal areas of LGE with high intensity in basal septal LGE, elevated T2 values in lateral wall concerning for myocardial inflammation. Findings concerning for sarcoidosis.  - LHC (6/23): mild nonobstructive CAD. - Suspect etiology cardiac sarcoidosis (most likely) vs hereditary LMNA cardiomyopathy. - Cardiac PET at [facility] (8/23) showed no evidence of cardiac sarcoidosis. EF 43% - Completed Prednisone 11/23  - Repeat PET 7/24 no evidence of sarcoid - Continue maintenance dose MXT  - NYHA Class II (stable). Euvolemic on exam and on device interrogation. No VT  - Continue spironolactone 25 mg daily.  - Continue Coreg 6.25 mg bid. - Continue Entresto 24/26 mg bid. H/o low BP/orthostatics limits further titration  - Continue Farxiga 10 mg daily. - Check CMP today  - Continue Paramedicine support, appreciate their help.   2. VT/Hx syncope: - ICD interrogated personally as above. No recent detection  - Continue amiodarone 200 mg daily. - Check amio labs. Reminded to get annual eye exam    3. Atrial flutter: - Regular on exam today. No recent detection on device interrogation  - On amiodarone as above.  - Continue Eliquis 5 bid. No bleeding - CBC today.   4. CAD: - Mild on LHC 6/23. - denies CP  - no ASA w/ a/c  - Continue Zetia. Has history of elevated LFTs with statins.  5. HTN: - controlled on current regimen - GDMT per above    Follow up in 3 months with Dr. Gala Romney, sooner if needed   Robbie Lis, PA-C  4:28 PM

## 2023-02-21 ENCOUNTER — Other Ambulatory Visit (HOSPITAL_COMMUNITY): Payer: Self-pay | Admitting: Internal Medicine

## 2023-02-21 ENCOUNTER — Telehealth (HOSPITAL_COMMUNITY): Payer: Self-pay

## 2023-02-21 DIAGNOSIS — E785 Hyperlipidemia, unspecified: Secondary | ICD-10-CM

## 2023-02-21 MED ORDER — EZETIMIBE 10 MG PO TABS
10.0000 mg | ORAL_TABLET | Freq: Every day | ORAL | 3 refills | Status: DC
Start: 2023-02-21 — End: 2023-09-23

## 2023-02-21 NOTE — Telephone Encounter (Signed)
Called John Keith's pharmacy to refill his Zetia and Methotrexate. [facility] reports needing refills for both to be sent in.   Call routed to HF triage for same.   [name], EMT-Paramedic [phone] 02/21/2023

## 2023-02-24 ENCOUNTER — Telehealth: Payer: Self-pay

## 2023-02-24 NOTE — Patient Outreach (Signed)
  Care Coordination   02/24/2023 Name: John Keith MRN: [id number] DOB: 1950-07-17   Care Coordination Outreach Attempts:  An unsuccessful telephone outreach was attempted today to offer the patient information about available care coordination services. Unable to reach patient or leave voice message.  Phone only rang.   Follow Up Plan:  Additional outreach attempts will be made to offer the patient care coordination information and services.   Encounter Outcome:  No Answer   Care Coordination Interventions:  No, not indicated    George Ina [facility] Care Coordination [phone] direct line

## 2023-03-03 ENCOUNTER — Other Ambulatory Visit (HOSPITAL_COMMUNITY): Payer: Self-pay | Admitting: Emergency Medicine

## 2023-03-03 NOTE — Progress Notes (Signed)
Paramedicine Encounter    Patient ID: John Keith, male    DOB: May 24, 1951, 72 y.o.   MRN: [id number]   Complaints -  none  Assessment - clear lung sounds. Ambulatory without exertion. No edema noted.   Compliance with meds - no missed doses noted  Pill box filled - for 2x weeks  Refills needed - none  Meds changes since last visit - none    Social changes - none   VISIT SUMMARY**  Met with John Keith in the home today. Pt reported that he has been feeling well and denied any complaints. Pt denied shortness of breath, dizziness, or chest pain. Pt reported he even picked up an extra shift. Pt was assessed as noted with no acute abnormalities found. Pt needed no refills at this time. Pts pill box was filled for 2x weeks. No appointments for pt to be reminded of today, but pt was reminded, with a physical note for reference, to schedule a follow up with Dr. Jesusita Oka. Will follow up in the home in 2x weeks.    BP 108/74   Pulse 60   Resp 16   Wt 234 lb 12.8 oz (106.5 kg)   SpO2 95%   BMI 25.16 kg/m  Weight yesterday-235.6 lbs Last visit weight-243 lbs   [name] Setting EMT-P Community Paramedic  [phone]    ACTION: Home visit completed     Patient Care Team: Dana Allan, MD as PCP - General (Family Medicine) Pincus Sanes, MD as Consulting Physician (Internal Medicine) Chilton Si, MD as Attending Physician (Cardiology) Regan Lemming, MD as Consulting Physician (Cardiology)  Patient Active Problem List   Diagnosis Date Noted  . Dermatitis 01/31/2023  . Onychomycosis 01/31/2023  . Statin myopathy 11/15/2022  . Abnormal glucose 11/15/2022  . Prostate cancer screening 11/15/2022  . Chronic systolic heart failure (HCC) 12/15/2021  . Chronic combined systolic and diastolic CHF (congestive heart failure) (HCC) 12/15/2021  . Nasal sore 12/15/2021  . Syncope 12/07/2021  . Ventricular tachycardia (HCC) 12/07/2021  . Typical atrial flutter (HCC)  09/24/2021  . Secondary hypercoagulable state (HCC) 09/24/2021  . Abnormal x-ray of neck 06/16/2021  . Abnormal MRI, lumbar spine 10/23/2020  . Chronic low back pain 10/23/2020  . Neuropathy 10/23/2020  . Coronary artery disease of native artery of native heart with stable angina pectoris (HCC) 10/23/2020  . Impingement syndrome of left shoulder region 08/22/2020  . Pain in joint of left shoulder 07/24/2020  . Chronic left shoulder pain 07/11/2020  . 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  . Anemia 12/13/2019  . Chest pain of uncertain etiology 10/24/2019  . PVC (premature ventricular contraction) 10/24/2019  . Gastric ulcer without hemorrhage or perforation 09/28/2019  . COVID-19 virus detected 07/12/2019  . Elevated troponin 07/07/2019  . Elevated serum protein level 07/07/2019  . Postherpetic neuralgia 08/17/2018  . Benign prostatic hyperplasia 08/01/2018  . Hip pain 06/29/2018  . Aortic atherosclerosis (HCC) 09/22/2016  . Fatigue 06/03/2016  . Lower urinary tract symptoms (LUTS) 06/03/2016  . Constipation 03/10/2016  . Lumbar radiculopathy 02/20/2016  . Encounter for immunization 11/26/2015  . Insomnia 06/10/2015  . External hemorrhoid 03/07/2015  . Health care maintenance 01/09/2014  . Carpal tunnel syndrome of left wrist 01/03/2013  . Hyperlipidemia 11/09/2012  . Vitamin D deficiency 11/09/2012  . Essential hypertension 11/06/2012  . Tobacco abuse 11/06/2012  . Seasonal allergies 11/06/2012  . Erectile dysfunction 11/06/2012  . GERD (gastroesophageal reflux disease) 11/06/2012  . DDD (degenerative disc disease), thoracolumbar 11/06/2012  .  Lactose intolerance 11/06/2012  . Diverticulosis of colon without hemorrhage 11/06/2012  . Diverticulosis of colon 11/06/2012  . Degeneration of thoracolumbar intervertebral disc 11/06/2012  . Gastroesophageal reflux disease 11/06/2012  . Tobacco user 11/06/2012    Current Outpatient Medications:  .  amiodarone (PACERONE) 200 MG  tablet, TAKE 1 TABLET(200 MG) BY MOUTH DAILY, Disp: 30 tablet, Rfl: 5 .  apixaban (ELIQUIS) 5 MG TABS tablet, Take 1 tablet (5 mg total) by mouth 2 (two) times daily., Disp: 60 tablet, Rfl: 3 .  calcium carbonate (OS-CAL - DOSED IN MG OF ELEMENTAL CALCIUM) 1250 (500 Ca) MG tablet, Take 1 tablet (500 mg of elemental calcium total) by mouth daily with breakfast., Disp: 30 tablet, Rfl: 11 .  carvedilol (COREG) 6.25 MG tablet, Take 1 tablet (6.25 mg total) by mouth 2 (two) times daily with a meal., Disp: 180 tablet, Rfl: 3 .  Cholecalciferol (VITAMIN D-3) 125 MCG (5000 UT) TABS, Take 1 tablet by mouth daily., Disp: 30 tablet, Rfl: 11 .  ezetimibe (ZETIA) 10 MG tablet, Take 1 tablet (10 mg total) by mouth daily., Disp: 90 tablet, Rfl: 3 .  FARXIGA 10 MG TABS tablet, Take 1 tablet (10 mg total) by mouth daily., Disp: 90 tablet, Rfl: 3 .  ferrous sulfate 325 (65 FE) MG tablet, Take 325 mg by mouth daily with breakfast., Disp: , Rfl:  .  folic acid (FOLVITE) 1 MG tablet, TAKE 1 TABLET(1 MG) BY MOUTH DAILY, Disp: 30 tablet, Rfl: 11 .  gabapentin (NEURONTIN) 300 MG capsule, Take 3 capsules (900 mg total) by mouth daily., Disp: 270 capsule, Rfl: 3 .  methotrexate (RHEUMATREX) 2.5 MG tablet, TAKE 20MG (8 TABLETS) EVERY WEDNESDAY. PROTECT FROM LIGHT AS DIRECTED, Disp: 150 tablet, Rfl: 0 .  Multiple Vitamin (MULTIVITAMIN) tablet, Take 1 tablet by mouth daily., Disp: , Rfl:  .  mupirocin ointment (BACTROBAN) 2 %, Apply 1 Application topically as needed., Disp: , Rfl:  .  pantoprazole (PROTONIX) 40 MG tablet, Take 1 tablet (40 mg total) by mouth daily. 30 min before food, Disp: 90 tablet, Rfl: 3 .  sacubitril-valsartan (ENTRESTO) 24-26 MG, Take 1 tablet by mouth 2 (two) times daily., Disp: 180 tablet, Rfl: 3 .  spironolactone (ALDACTONE) 25 MG tablet, Take 1 tablet (25 mg total) by mouth daily., Disp: 90 tablet, Rfl: 0 .  triamcinolone cream (KENALOG) 0.1 %, Apply 1 Application topically 2 (two) times daily. (Patient  taking differently: Apply 1 Application topically 2 (two) times daily. As needed), Disp: 30 g, Rfl: 0 Allergies  Allergen Reactions  . Crestor [Rosuvastatin Calcium] Other (See Comments)    Elevated muscle enzymes   . Lipitor [Atorvastatin Calcium] Other (See Comments)    Elevated muscle enzymes      Social History   Socioeconomic History  . Marital status: Married    Spouse name: Elinor Dodge  . Number of children: 2  . Years of education: Not on file  . Highest education level: High school graduate  Occupational History  . Occupation: Retired    Comment: Semi  Tobacco Use  . Smoking status: Former    Current packs/day: 0.00    Average packs/day: 0.5 packs/day for 49.0 years (24.5 ttl pk-yrs)    Types: Cigarettes    Start date: 07/06/1971    Quit date: 06/26/2020    Years since quitting: 2.6  . Smokeless tobacco: Never  . Tobacco comments:    Former smoker 09/24/21  Vaping Use  . Vaping status: Never Used  Substance and Sexual Activity  .  Alcohol use: No    Alcohol/week: 0.0 standard drinks of alcohol  . Drug use: No  . Sexual activity: Not on file  Other Topics Concern  . Not on file  Social History Narrative   Married    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    On child    1 son, 1 daughter   Machine op   Owns guns    Wears seat belt, safe in relationship    Smoker    Retired 06/27/2019   Social Determinants of Health   Financial Resource Strain: Low Risk  (11/22/2022)   Overall Financial Resource Strain (CARDIA)   . Difficulty of Paying Living Expenses: Not hard at all  Food Insecurity: No Food Insecurity (11/22/2022)   Hunger Vital Sign   . Worried About Programme researcher, broadcasting/film/video in the Last Year: Never true   . Ran Out of Food in the Last Year: Never true  Transportation Needs: No Transportation Needs (11/22/2022)   PRAPARE - Transportation   . Lack of Transportation (Medical): No   . Lack of Transportation (Non-Medical): No  Physical Activity: Sufficiently Active (11/22/2022)    Exercise Vital Sign   . Days of Exercise per Week: 3 days   . Minutes of Exercise per Session: 60 min  Stress: No Stress Concern Present (11/22/2022)   [employer] of Occupational Health - Occupational Stress Questionnaire   . Feeling of Stress : Not at all  Social Connections: Socially Integrated (11/22/2022)   Social Connection and Isolation Panel [NHANES]   . Frequency of Communication with Friends and Family: Once a week   . Frequency of Social Gatherings with Friends and Family: More than three times a week   . Attends Religious Services: More than 4 times per year   . Active Member of Clubs or Organizations: Yes   . Attends Banker Meetings: Never   . Marital Status: Married  Catering manager Violence: Not At Risk (11/22/2022)   Humiliation, Afraid, Rape, and Kick questionnaire   . Fear of Current or Ex-Partner: No   . Emotionally Abused: No   . Physically Abused: No   . Sexually Abused: No    Physical Exam      Future Appointments  Date Time Provider Department Center  03/11/2023  7:00 AM [facility] DEVICE REMOTES [facility]  04/11/2023  2:00 PM Regan Lemming, MD [facility]  05/04/2023  8:45 AM McCaughan, Dia D, DPM [facility]  06/10/2023  7:00 AM [facility] DEVICE REMOTES [facility]  08/04/2023  8:40 AM Dana Allan, MD [facility]  09/09/2023  7:00 AM [facility] DEVICE REMOTES [facility]  11/23/2023  8:30 AM [facility] ANNUAL WELLNESS VISIT [facility]  12/09/2023  7:00 AM [facility] DEVICE REMOTES [facility]  03/09/2024  7:00 AM [facility] DEVICE REMOTES [facility]  06/08/2024  7:00 AM [facility] DEVICE REMOTES [facility]

## 2023-03-11 ENCOUNTER — Ambulatory Visit (INDEPENDENT_AMBULATORY_CARE_PROVIDER_SITE_OTHER): Payer: PPO

## 2023-03-11 DIAGNOSIS — I472 Ventricular tachycardia, unspecified: Secondary | ICD-10-CM | POA: Diagnosis not present

## 2023-03-11 LAB — CUP PACEART REMOTE DEVICE CHECK
Battery Remaining Longevity: 130 mo
Battery Voltage: 3.03 V
Brady Statistic RV Percent Paced: 0.21 %
Date Time Interrogation Session: 20240906044223
HighPow Impedance: 63 Ohm
Implantable Lead Connection Status: 753985
Implantable Lead Implant Date: 20230608
Implantable Lead Location: 753860
Implantable Pulse Generator Implant Date: 20230608
Lead Channel Impedance Value: 285 Ohm
Lead Channel Impedance Value: 342 Ohm
Lead Channel Pacing Threshold Amplitude: 0.75 V
Lead Channel Pacing Threshold Pulse Width: 0.4 ms
Lead Channel Sensing Intrinsic Amplitude: 10.5 mV
Lead Channel Sensing Intrinsic Amplitude: 10.5 mV
Lead Channel Setting Pacing Amplitude: 2 V
Lead Channel Setting Pacing Pulse Width: 0.4 ms
Lead Channel Setting Sensing Sensitivity: 0.3 mV
Zone Setting Status: 755011
Zone Setting Status: 755011

## 2023-03-15 NOTE — Progress Notes (Signed)
Remote ICD transmission.   

## 2023-03-17 ENCOUNTER — Other Ambulatory Visit (HOSPITAL_COMMUNITY): Payer: Self-pay | Admitting: Emergency Medicine

## 2023-03-17 NOTE — Progress Notes (Signed)
Paramedicine Encounter    Patient ID: John Keith, male    DOB: 18-Nov-1950, 72 y.o.   MRN: [id number]   Complaints - none, denied dizziness, chest pain, palpitation, or shortness of breath   Assessment - lung sounds clear,   Compliance with meds no missed doses   Pill box filled - for 2x weeks   Refills needed - gabapentin   Meds changes since last visit - none     Social changes - none     VISIT SUMMARY**  Met with John Keith in the home today. John Keith no complaints and advised that he could maintian completing his ADL's. Pt denied any shortness of breath, chest pain, palpitations, or dizziness, but did report some variable blood pressures. Pt's blood pressure chart and weight chart were reviewed. Blood pressure ranged from 150s systolic to the high 90s systolic. Pt denied any recent stress or illness and couldn't identify the cause of the variance. Pt advised that he was taking them at the same time and same compliance as he does his medicines. I advised we could bring this concern up at his upcoming [facility] appointment and pt was in agreement with same. PT assessed as noted to find no acute abnormalities. Pt's pill box was filled for 2x weeks. Upcoming appointments were reviewed and written down for reference. Will follow up with pt in the home in 2x weeks.   BP 106/60   Pulse 62   Resp 16   Wt 236 lb 12.8 oz (107.4 kg)   SpO2 92%   BMI 25.38 kg/m  Weight yesterday-236.2 lbs  Last visit weight-234 lbs    [name] Setting EMT-P Community Paramedic  [phone]    ACTION: Home visit completed     Patient Care Team: Dana Allan, MD as PCP - General (Family Medicine) Pincus Sanes, MD as Consulting Physician (Internal Medicine) Chilton Si, MD as Attending Physician (Cardiology) Regan Lemming, MD as Consulting Physician (Cardiology)  Patient Active Problem List   Diagnosis Date Noted   Dermatitis 01/31/2023   Onychomycosis 01/31/2023   Statin  myopathy 11/15/2022   Abnormal glucose 11/15/2022   Prostate cancer screening 11/15/2022   Chronic systolic heart failure (HCC) 12/15/2021   Chronic combined systolic and diastolic CHF (congestive heart failure) (HCC) 12/15/2021   Nasal sore 12/15/2021   Syncope 12/07/2021   Ventricular tachycardia (HCC) 12/07/2021   Typical atrial flutter (HCC) 09/24/2021   Secondary hypercoagulable state (HCC) 09/24/2021   Abnormal x-ray of neck 06/16/2021   Abnormal MRI, lumbar spine 10/23/2020   Chronic low back pain 10/23/2020   Neuropathy 10/23/2020   Coronary artery disease of native artery of native heart with stable angina pectoris (HCC) 10/23/2020   Impingement syndrome of left shoulder region 08/22/2020   Pain in joint of left shoulder 07/24/2020   Chronic left shoulder pain 07/11/2020   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   Anemia 12/13/2019   Chest pain of uncertain etiology 10/24/2019   PVC (premature ventricular contraction) 10/24/2019   Gastric ulcer without hemorrhage or perforation 09/28/2019   COVID-19 virus detected 07/12/2019   Elevated troponin 07/07/2019   Elevated serum protein level 07/07/2019   Postherpetic neuralgia 08/17/2018   Benign prostatic hyperplasia 08/01/2018   Hip pain 06/29/2018   Aortic atherosclerosis (HCC) 09/22/2016   Fatigue 06/03/2016   Lower urinary tract symptoms (LUTS) 06/03/2016   Constipation 03/10/2016   Lumbar radiculopathy 02/20/2016   Encounter for immunization 11/26/2015   Insomnia 06/10/2015   External hemorrhoid 03/07/2015   Health  care maintenance 01/09/2014   Carpal tunnel syndrome of left wrist 01/03/2013   Hyperlipidemia 11/09/2012   Vitamin D deficiency 11/09/2012   Essential hypertension 11/06/2012   Tobacco abuse 11/06/2012   Seasonal allergies 11/06/2012   Erectile dysfunction 11/06/2012   GERD (gastroesophageal reflux disease) 11/06/2012   DDD (degenerative disc disease), thoracolumbar 11/06/2012   Lactose intolerance  11/06/2012   Diverticulosis of colon without hemorrhage 11/06/2012   Diverticulosis of colon 11/06/2012   Degeneration of thoracolumbar intervertebral disc 11/06/2012   Gastroesophageal reflux disease 11/06/2012   Tobacco user 11/06/2012    Current Outpatient Medications:    amiodarone (PACERONE) 200 MG tablet, TAKE 1 TABLET(200 MG) BY MOUTH DAILY, Disp: 30 tablet, Rfl: 5   apixaban (ELIQUIS) 5 MG TABS tablet, Take 1 tablet (5 mg total) by mouth 2 (two) times daily., Disp: 60 tablet, Rfl: 3   calcium carbonate (OS-CAL - DOSED IN MG OF ELEMENTAL CALCIUM) 1250 (500 Ca) MG tablet, Take 1 tablet (500 mg of elemental calcium total) by mouth daily with breakfast., Disp: 30 tablet, Rfl: 11   carvedilol (COREG) 6.25 MG tablet, Take 1 tablet (6.25 mg total) by mouth 2 (two) times daily with a meal., Disp: 180 tablet, Rfl: 3   Cholecalciferol (VITAMIN D-3) 125 MCG (5000 UT) TABS, Take 1 tablet by mouth daily., Disp: 30 tablet, Rfl: 11   ezetimibe (ZETIA) 10 MG tablet, Take 1 tablet (10 mg total) by mouth daily., Disp: 90 tablet, Rfl: 3   FARXIGA 10 MG TABS tablet, Take 1 tablet (10 mg total) by mouth daily., Disp: 90 tablet, Rfl: 3   ferrous sulfate 325 (65 FE) MG tablet, Take 325 mg by mouth daily with breakfast., Disp: , Rfl:    folic acid (FOLVITE) 1 MG tablet, TAKE 1 TABLET(1 MG) BY MOUTH DAILY, Disp: 30 tablet, Rfl: 11   gabapentin (NEURONTIN) 300 MG capsule, Take 3 capsules (900 mg total) by mouth daily., Disp: 270 capsule, Rfl: 3   methotrexate (RHEUMATREX) 2.5 MG tablet, TAKE 20MG (8 TABLETS) EVERY WEDNESDAY. PROTECT FROM LIGHT AS DIRECTED, Disp: 150 tablet, Rfl: 0   Multiple Vitamin (MULTIVITAMIN) tablet, Take 1 tablet by mouth daily., Disp: , Rfl:    mupirocin ointment (BACTROBAN) 2 %, Apply 1 Application topically as needed., Disp: , Rfl:    pantoprazole (PROTONIX) 40 MG tablet, Take 1 tablet (40 mg total) by mouth daily. 30 min before food, Disp: 90 tablet, Rfl: 3   sacubitril-valsartan  (ENTRESTO) 24-26 MG, Take 1 tablet by mouth 2 (two) times daily., Disp: 180 tablet, Rfl: 3   spironolactone (ALDACTONE) 25 MG tablet, Take 1 tablet (25 mg total) by mouth daily., Disp: 90 tablet, Rfl: 0   triamcinolone cream (KENALOG) 0.1 %, Apply 1 Application topically 2 (two) times daily., Disp: 30 g, Rfl: 0 Allergies  Allergen Reactions   Crestor [Rosuvastatin Calcium] Other (See Comments)    Elevated muscle enzymes    Lipitor [Atorvastatin Calcium] Other (See Comments)    Elevated muscle enzymes      Social History   Socioeconomic History   Marital status: Married    Spouse name: Gwendolyn   Number of children: 2   Years of education: Not on file   Highest education level: High school graduate  Occupational History   Occupation: Retired    Comment: Semi  Tobacco Use   Smoking status: Former    Current packs/day: 0.00    Average packs/day: 0.5 packs/day for 49.0 years (24.5 ttl pk-yrs)    Types: Cigarettes  Start date: 07/06/1971    Quit date: 06/26/2020    Years since quitting: 2.7   Smokeless tobacco: Never   Tobacco comments:    Former smoker 09/24/21  Vaping Use   Vaping status: Never Used  Substance and Sexual Activity   Alcohol use: No    Alcohol/week: 0.0 standard drinks of alcohol   Drug use: No   Sexual activity: Not on file  Other Topics Concern   Not on file  Social History Narrative   Married    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    On child    1 son, 1 daughter   Machine op   Owns guns    Wears seat belt, safe in relationship    Smoker    Retired 06/27/2019   Social Determinants of Health   Financial Resource Strain: Low Risk  (11/22/2022)   Overall Financial Resource Strain (CARDIA)    Difficulty of Paying Living Expenses: Not hard at all  Food Insecurity: No Food Insecurity (11/22/2022)   Hunger Vital Sign    Worried About Running Out of Food in the Last Year: Never true    Ran Out of Food in the Last Year: Never true  Transportation Needs: No  Transportation Needs (11/22/2022)   PRAPARE - Administrator, Civil Service (Medical): No    Lack of Transportation (Non-Medical): No  Physical Activity: Sufficiently Active (11/22/2022)   Exercise Vital Sign    Days of Exercise per Week: 3 days    Minutes of Exercise per Session: 60 min  Stress: No Stress Concern Present (11/22/2022)   [employer] of Occupational Health - Occupational Stress Questionnaire    Feeling of Stress : Not at all  Social Connections: Socially Integrated (11/22/2022)   Social Connection and Isolation Panel [NHANES]    Frequency of Communication with Friends and Family: Once a week    Frequency of Social Gatherings with Friends and Family: More than three times a week    Attends Religious Services: More than 4 times per year    Active Member of [employer] or Organizations: Yes    Attends Banker Meetings: Never    Marital Status: Married  Catering manager Violence: Not At Risk (11/22/2022)   Humiliation, Afraid, Rape, and Kick questionnaire    Fear of Current or Ex-Partner: No    Emotionally Abused: No    Physically Abused: No    Sexually Abused: No    Physical Exam      Future Appointments  Date Time Provider Department Center  04/11/2023  2:00 PM Regan Lemming, MD [facility]  05/04/2023  8:45 AM McCaughan, Dia D, DPM [facility]  05/30/2023  9:20 AM [name], Bevelyn Buckles, MD [facility] None  06/10/2023  7:00 AM [facility] DEVICE REMOTES [facility]  08/04/2023  8:40 AM Dana Allan, MD [facility]  09/09/2023  7:00 AM [facility] DEVICE REMOTES [facility]  11/23/2023  8:30 AM [facility] ANNUAL WELLNESS VISIT [facility]  12/09/2023  7:00 AM [facility] DEVICE REMOTES [facility]  03/09/2024  7:00 AM [facility] DEVICE REMOTES [facility]  06/08/2024  7:00 AM [facility] DEVICE REMOTES [facility]

## 2023-03-22 ENCOUNTER — Telehealth (HOSPITAL_COMMUNITY): Payer: Self-pay

## 2023-03-22 DIAGNOSIS — K259 Gastric ulcer, unspecified as acute or chronic, without hemorrhage or perforation: Secondary | ICD-10-CM

## 2023-03-22 DIAGNOSIS — R109 Unspecified abdominal pain: Secondary | ICD-10-CM

## 2023-03-22 MED ORDER — PANTOPRAZOLE SODIUM 40 MG PO TBEC: 40.0000 mg | DELAYED_RELEASE_TABLET | Freq: Every day | ORAL | 0 refills | Status: DC

## 2023-03-22 NOTE — Telephone Encounter (Signed)
COURTESY REFILL SENT ADDITIONAL REFILLS WILL NEED TO COME FROM PCP

## 2023-03-22 NOTE — Telephone Encounter (Signed)
Patient contacted me stating [facility] has tried to get his pantoprazole refilled several times by faxing the doctor with no success.   Listed as being prescribed by HF clinic last-  I will forward to triage. I advised him it might have to come from his PCP if HF is unable to fill- I will send Dr. Clent Ridges a message if needed. He agreed and was grateful for the assistance.   [name], EMT-Paramedic [phone] 03/22/2023

## 2023-03-22 NOTE — Addendum Note (Signed)
Addended by: Theresia Bough on: 03/22/2023 03:56 PM   Modules accepted: Orders

## 2023-03-31 ENCOUNTER — Other Ambulatory Visit (HOSPITAL_COMMUNITY): Payer: Self-pay | Admitting: Emergency Medicine

## 2023-03-31 ENCOUNTER — Other Ambulatory Visit (HOSPITAL_COMMUNITY): Payer: Self-pay | Admitting: Internal Medicine

## 2023-03-31 NOTE — Progress Notes (Signed)
Paramedicine Encounter    Patient ID: John Keith, male    DOB: [birth year], 72 y.o.   MRN: [id number]   Complaints - none  Assessment - lung sounds clear, no ascites, no edema noted.   Compliance with meds - no missed doses noted  Pill box filled - for 2x weeks   Refills needed - Folic Acid, amiodarone  Meds changes since last visit - none     Social changes - none   VISIT SUMMARY**  Met with John Keith in the home today. Pt had no complaints of shortness of breath, chest pain, palpitations, or shortness of breath. Pt did advise that he has been having intermittent headaches. Pt asked what he could take to help alleviate. I recommended tylenol as needed. Pt understood and agreed. Pt questioned some varying blood pressures and advised that he takes it in the morning while getting ready for work. Pt was advised to try a different time of day, like at the end of the day. Pt agreed and advised that he would try changing times. Pt's pill box was filled for 2x weeks. Upcoming appointments were reviewed and calendar updated. Will follow up in 2x weeks.   BP 110/62   Pulse 62   Resp 16   Wt 235 lb 9.6 oz (106.9 kg)   SpO2 96%   BMI 25.25 kg/m  Weight yesterday-236 lbs Last visit weight-236 lbs   [name] Setting EMT-P Community Paramedic  [phone]     ACTION: Home visit completed     Patient Care Team: Dana Allan, MD as PCP - General (Family Medicine) Pincus Sanes, MD as Consulting Physician (Internal Medicine) Chilton Si, MD as Attending Physician (Cardiology) Regan Lemming, MD as Consulting Physician (Cardiology)  Patient Active Problem List   Diagnosis Date Noted   Dermatitis 01/31/2023   Onychomycosis 01/31/2023   Statin myopathy 11/15/2022   Abnormal glucose 11/15/2022   Prostate cancer screening 11/15/2022   Chronic systolic heart failure (HCC) 12/15/2021   Chronic combined systolic and diastolic CHF (congestive heart failure) (HCC)  12/15/2021   Nasal sore 12/15/2021   Syncope 12/07/2021   Ventricular tachycardia (HCC) 12/07/2021   Typical atrial flutter (HCC) 09/24/2021   Secondary hypercoagulable state (HCC) 09/24/2021   Abnormal x-ray of neck 06/16/2021   Abnormal MRI, lumbar spine 10/23/2020   Chronic low back pain 10/23/2020   Neuropathy 10/23/2020   Coronary artery disease of native artery of native heart with stable angina pectoris (HCC) 10/23/2020   Impingement syndrome of left shoulder region 08/22/2020   Pain in joint of left shoulder 07/24/2020   Chronic left shoulder pain 07/11/2020   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   Anemia 12/13/2019   Chest pain of uncertain etiology 10/24/2019   PVC (premature ventricular contraction) 10/24/2019   Gastric ulcer without hemorrhage or perforation 09/28/2019   COVID-19 virus detected 07/12/2019   Elevated troponin 07/07/2019   Elevated serum protein level 07/07/2019   Postherpetic neuralgia 08/17/2018   Benign prostatic hyperplasia 08/01/2018   Hip pain 06/29/2018   Aortic atherosclerosis (HCC) 09/22/2016   Fatigue 06/03/2016   Lower urinary tract symptoms (LUTS) 06/03/2016   Constipation 03/10/2016   Lumbar radiculopathy 02/20/2016   Encounter for immunization 11/26/2015   Insomnia 06/10/2015   External hemorrhoid 03/07/2015   Health care maintenance 01/09/2014   Carpal tunnel syndrome of left wrist 01/03/2013   Hyperlipidemia 11/09/2012   Vitamin D deficiency 11/09/2012   Essential hypertension 11/06/2012   Tobacco abuse 11/06/2012   Seasonal allergies 11/06/2012  Erectile dysfunction 11/06/2012   GERD (gastroesophageal reflux disease) 11/06/2012   DDD (degenerative disc disease), thoracolumbar 11/06/2012   Lactose intolerance 11/06/2012   Diverticulosis of colon without hemorrhage 11/06/2012   Diverticulosis of colon 11/06/2012   Degeneration of thoracolumbar intervertebral disc 11/06/2012   Gastroesophageal reflux disease 11/06/2012   Tobacco  user 11/06/2012    Current Outpatient Medications:    apixaban (ELIQUIS) 5 MG TABS tablet, Take 1 tablet (5 mg total) by mouth 2 (two) times daily., Disp: 60 tablet, Rfl: 3   calcium carbonate (OS-CAL - DOSED IN MG OF ELEMENTAL CALCIUM) 1250 (500 Ca) MG tablet, Take 1 tablet (500 mg of elemental calcium total) by mouth daily with breakfast., Disp: 30 tablet, Rfl: 11   carvedilol (COREG) 6.25 MG tablet, Take 1 tablet (6.25 mg total) by mouth 2 (two) times daily with a meal., Disp: 180 tablet, Rfl: 3   Cholecalciferol (VITAMIN D-3) 125 MCG (5000 UT) TABS, Take 1 tablet by mouth daily., Disp: 30 tablet, Rfl: 11   ezetimibe (ZETIA) 10 MG tablet, Take 1 tablet (10 mg total) by mouth daily., Disp: 90 tablet, Rfl: 3   FARXIGA 10 MG TABS tablet, Take 1 tablet (10 mg total) by mouth daily., Disp: 90 tablet, Rfl: 3   ferrous sulfate 325 (65 FE) MG tablet, Take 325 mg by mouth daily with breakfast., Disp: , Rfl:    folic acid (FOLVITE) 1 MG tablet, TAKE 1 TABLET(1 MG) BY MOUTH DAILY, Disp: 30 tablet, Rfl: 11   gabapentin (NEURONTIN) 300 MG capsule, Take 3 capsules (900 mg total) by mouth daily., Disp: 270 capsule, Rfl: 3   methotrexate (RHEUMATREX) 2.5 MG tablet, TAKE 20MG (8 TABLETS) EVERY WEDNESDAY. PROTECT FROM LIGHT AS DIRECTED, Disp: 150 tablet, Rfl: 0   Multiple Vitamin (MULTIVITAMIN) tablet, Take 1 tablet by mouth daily., Disp: , Rfl:    mupirocin ointment (BACTROBAN) 2 %, Apply 1 Application topically as needed., Disp: , Rfl:    pantoprazole (PROTONIX) 40 MG tablet, Take 1 tablet (40 mg total) by mouth daily. 30 min before food, Disp: 30 tablet, Rfl: 0   sacubitril-valsartan (ENTRESTO) 24-26 MG, Take 1 tablet by mouth 2 (two) times daily., Disp: 180 tablet, Rfl: 3   spironolactone (ALDACTONE) 25 MG tablet, Take 1 tablet (25 mg total) by mouth daily., Disp: 90 tablet, Rfl: 0   triamcinolone cream (KENALOG) 0.1 %, Apply 1 Application topically 2 (two) times daily., Disp: 30 g, Rfl: 0   amiodarone  (PACERONE) 200 MG tablet, TAKE 1 TABLET(200 MG) BY MOUTH DAILY, Disp: 30 tablet, Rfl: 5 Allergies  Allergen Reactions   Crestor [Rosuvastatin Calcium] Other (See Comments)    Elevated muscle enzymes    Lipitor [Atorvastatin Calcium] Other (See Comments)    Elevated muscle enzymes      Social History   Socioeconomic History   Marital status: Married    Spouse name: Gwendolyn   Number of children: 2   Years of education: Not on file   Highest education level: High school graduate  Occupational History   Occupation: Retired    Comment: Semi  Tobacco Use   Smoking status: Former    Current packs/day: 0.00    Average packs/day: 0.5 packs/day for 49.0 years (24.5 ttl pk-yrs)    Types: Cigarettes    Start date: 07/06/1971    Quit date: 06/26/2020    Years since quitting: 2.7   Smokeless tobacco: Never   Tobacco comments:    Former smoker 09/24/21  Vaping Use   Vaping status:  Never Used  Substance and Sexual Activity   Alcohol use: No    Alcohol/week: 0.0 standard drinks of alcohol   Drug use: No   Sexual activity: Not on file  Other Topics Concern   Not on file  Social History Narrative   Married    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    On child    1 son, 1 daughter   Machine op   Owns guns    Wears seat belt, safe in relationship    Smoker    Retired 06/27/2019   Social Determinants of Health   Financial Resource Strain: Low Risk  (11/22/2022)   Overall Financial Resource Strain (CARDIA)    Difficulty of Paying Living Expenses: Not hard at all  Food Insecurity: No Food Insecurity (11/22/2022)   Hunger Vital Sign    Worried About Running Out of Food in the Last Year: Never true    Ran Out of Food in the Last Year: Never true  Transportation Needs: No Transportation Needs (11/22/2022)   PRAPARE - Administrator, Civil Service (Medical): No    Lack of Transportation (Non-Medical): No  Physical Activity: Sufficiently Active (11/22/2022)   Exercise Vital Sign    Days of  Exercise per Week: 3 days    Minutes of Exercise per Session: 60 min  Stress: No Stress Concern Present (11/22/2022)   [employer] of Occupational Health - Occupational Stress Questionnaire    Feeling of Stress : Not at all  Social Connections: Socially Integrated (11/22/2022)   Social Connection and Isolation Panel [NHANES]    Frequency of Communication with Friends and Family: Once a week    Frequency of Social Gatherings with Friends and Family: More than three times a week    Attends Religious Services: More than 4 times per year    Active Member of [employer] or Organizations: Yes    Attends Banker Meetings: Never    Marital Status: Married  Catering manager Violence: Not At Risk (11/22/2022)   Humiliation, Afraid, Rape, and Kick questionnaire    Fear of Current or Ex-Partner: No    Emotionally Abused: No    Physically Abused: No    Sexually Abused: No    Physical Exam      Future Appointments  Date Time Provider Department Center  04/11/2023  2:00 PM Regan Lemming, MD [facility]  05/04/2023  8:45 AM McCaughan, Dia D, DPM [facility]  05/30/2023  9:20 AM [name], Bevelyn Buckles, MD [facility] None  06/10/2023  7:00 AM [facility] DEVICE REMOTES [facility]  08/04/2023  8:40 AM Dana Allan, MD [facility]  09/09/2023  7:00 AM [facility] DEVICE REMOTES [facility]  11/23/2023  8:30 AM [facility] ANNUAL WELLNESS VISIT [facility]  12/09/2023  7:00 AM [facility] DEVICE REMOTES [facility]  03/09/2024  7:00 AM [facility] DEVICE REMOTES [facility]  06/08/2024  7:00 AM [facility] DEVICE REMOTES [facility]

## 2023-04-11 ENCOUNTER — Ambulatory Visit: Payer: PPO | Attending: Cardiology | Admitting: Cardiology

## 2023-04-11 ENCOUNTER — Encounter: Payer: Self-pay | Admitting: Cardiology

## 2023-04-11 VITALS — BP 112/66 | HR 58 | Ht >= 80 in | Wt 242.0 lb

## 2023-04-11 DIAGNOSIS — I483 Typical atrial flutter: Secondary | ICD-10-CM | POA: Diagnosis not present

## 2023-04-11 DIAGNOSIS — I5022 Chronic systolic (congestive) heart failure: Secondary | ICD-10-CM

## 2023-04-11 DIAGNOSIS — I472 Ventricular tachycardia, unspecified: Secondary | ICD-10-CM

## 2023-04-11 DIAGNOSIS — D6869 Other thrombophilia: Secondary | ICD-10-CM

## 2023-04-11 DIAGNOSIS — I10 Essential (primary) hypertension: Secondary | ICD-10-CM

## 2023-04-11 NOTE — Patient Instructions (Signed)
Medication Instructions:  Your physician recommends that you continue on your current medications as directed. Please refer to the Current Medication list given to you today.  *If you need a refill on your cardiac medications before your next appointment, please call your pharmacy*   Lab Work: None ordered   Testing/Procedures: None ordered   Follow-Up: At [facility], you and your health needs are our priority.  As part of our continuing mission to provide you with exceptional heart care, we have created designated Provider Care Teams.  These Care Teams include your primary Cardiologist (physician) and Advanced Practice Providers (APPs -  Physician Assistants and Nurse Practitioners) who all work together to provide you with the care you need, when you need it.  We recommend signing up for the patient portal called "MyChart".  Sign up information is provided on this After Visit Summary.  MyChart is used to connect with patients for Virtual Visits (Telemedicine).  Patients are able to view lab/test results, encounter notes, upcoming appointments, etc.  Non-urgent messages can be sent to your provider as well.   To learn more about what you can do with MyChart, go to [email or web address].    Remote monitoring is used to monitor your Pacemaker or ICD from home. This monitoring reduces the number of office visits required to check your device to one time per year. It allows [place] to keep an eye on the functioning of your device to ensure it is working properly. You are scheduled for a device check from home on 06/10/2023. You may send your transmission at any time that day. If you have a wireless device, the transmission will be sent automatically. After your physician reviews your transmission, you will receive a postcard with your next transmission date.  Your next appointment:   1 year(s)  The format for your next appointment:   In Person  Provider:   You will see one of the following  Advanced Practice Providers on your designated Care Team:   Francis Dowse, [place] "Mardelle Matte" [place], [place] Canary Brim, NP   Thank you for choosing [facility]!!   Dory Horn, RN [phone]

## 2023-04-11 NOTE — Progress Notes (Signed)
  Electrophysiology Office Note:   Date:  04/11/2023  ID:  MAKARIOS MADLOCK, DOB August 23, 1950, MRN [id number]  Primary Cardiologist: Arvilla Meres, MD Electrophysiologist: Regan Lemming, MD      History of Present Illness:   John Keith is a 72 y.o. male with h/o Hypertension, hyperlipidemia, tobacco abuse, gastric ulcer, atrial flutter, chronic systolic heart failure seen today for routine electrophysiology followup.   Since last being seen in our clinic the patient reports doing well.  He has noted no further episodes of atrial fibrillation or flutter.  He has been able to do all of his daily activities without restriction..  he denies chest pain, palpitations, dyspnea, PND, orthopnea, nausea, vomiting, dizziness, syncope, edema, weight gain, or early satiety.   Review of systems complete and found to be negative unless listed in HPI.      EP Information / Studies Reviewed:    EKG is ordered today. Personal review as below.  EKG Interpretation Date/Time:  Monday April 11 2023 13:30:00 EDT Ventricular Rate:  58 PR Interval:  208 QRS Duration:  104 QT Interval:  422 QTC Calculation: 414 R Axis:   53  Text Interpretation: Sinus bradycardia T wave abnormality, consider inferolateral ischemia When compared with ECG of 13-Sep-2022 10:03, No significant change was found Confirmed by Sumer Moorehouse ([id number]) on 04/11/2023 1:36:18 PM   ICD Interrogation-  reviewed in detail today,  See PACEART report.  Device History: Medtronic Single Chamber ICD implanted 12/10/21 for VT History of appropriate therapy: No History of AAD therapy: Yes; currently on amiodarone    Risk Assessment/Calculations:    CHA2DS2-VASc Score = 3   This indicates a 3.2% annual risk of stroke. The patient's score is based upon: CHF History: 1 HTN History: 1 Diabetes History: 0 Stroke History: 0 Vascular Disease History: 0 Age Score: 1 Gender Score: 0             Physical Exam:   VS:  BP 112/66  (BP Location: Left Arm, Patient Position: Sitting, Cuff Size: Normal)   Pulse (!) 58   Ht 6\' 9"  (2.057 m)   Wt 242 lb (109.8 kg)   BMI 25.93 kg/m    Wt Readings from Last 3 Encounters:  04/11/23 242 lb (109.8 kg)  03/31/23 235 lb 9.6 oz (106.9 kg)  03/17/23 236 lb 12.8 oz (107.4 kg)     GEN: Well nourished, well developed in no acute distress NECK: No JVD; No carotid bruits CARDIAC: Regular rate and rhythm, no murmurs, rubs, gallops RESPIRATORY:  Clear to auscultation without rales, wheezing or rhonchi  ABDOMEN: Soft, non-tender, non-distended EXTREMITIES:  No edema; No deformity   ASSESSMENT AND PLAN:    Ventricular tachycardia s/p Medtronic single chamber ICD  euvolemic today Stable on an appropriate medical regimen Normal ICD function See Pace Art report No changes today  2.  Atrial flutter: Currently on amiodarone.  Remains in sinus rhythm.  No changes.  3.  Secondary hypercoagulable state: Currently on Eliquis for atrial flutter  4.  Hypertension: Currently well-controlled  5.  Chronic systolic heart failure: Due to nonischemic cardiomyopathy.  Plan per primary cardiology  Disposition:   Follow up with EP APP in 12 months   Signed, Gannon Heinzman Jorja Loa, MD

## 2023-04-14 ENCOUNTER — Other Ambulatory Visit (HOSPITAL_COMMUNITY): Payer: Self-pay | Admitting: Internal Medicine

## 2023-04-14 ENCOUNTER — Other Ambulatory Visit (HOSPITAL_COMMUNITY): Payer: Self-pay | Admitting: Emergency Medicine

## 2023-04-14 ENCOUNTER — Other Ambulatory Visit (HOSPITAL_COMMUNITY): Payer: Self-pay

## 2023-04-14 NOTE — Progress Notes (Signed)
Paramedicine Encounter    Patient ID: John Keith, male    DOB: 10-28-1950, 72 y.o.   MRN: [id number]   Complaints - Denied shortness of breath, chest pain, palpitations, or dizziness. No complaints reported.   Assessment - Lung sounds clear, no edema noted  Compliance with meds - no missed doses  Pill box filled or 2x weeks   Refills needed - carvedilol, spironolactone, calcium supplement, Eliquis, Farxiga.   Meds changes since last visit - none   Social changes - none   VISIT SUMMARY**  Met with John Keith today in the home. Pt denied any complaints at this time and advised that he had been feeling well. Pt did reported progressive weight increase over the past week. Pt reported that he has not had any restrictions on his diet. Pt was reeducated in his dietary restrictions to consider. Pt advised that he would get back on track and shared upcoming meal plan so that he could be successful. Pt's weights and Bps reviewed with no concernings findings. Pt's pill box was filled for 2x weeks. Upcoming appointments reviewed. Will follow up in 2x weeks.   BP 110/60   Pulse (!) 56   Resp 16   Wt 237 lb (107.5 kg)   SpO2 97%   BMI 25.40 kg/m  Weight yesterday-236.2lbs  Last visit weight-235 lbs   [name] [facility] EMT-P Community Paramedic  [phone]     ACTION: Home visit completed     Patient Care Team: Dana Allan, MD as PCP - General (Family Medicine) Regan Lemming, MD as PCP - Electrophysiology (Cardiology) [name], Bevelyn Buckles, MD as PCP - Cardiology (Cardiology) Pincus Sanes, MD as Consulting Physician (Internal Medicine) Chilton Si, MD as Attending Physician (Cardiology) Regan Lemming, MD as Consulting Physician (Cardiology)  Patient Active Problem List   Diagnosis Date Noted   Dermatitis 01/31/2023   Onychomycosis 01/31/2023   Statin myopathy 11/15/2022   Abnormal glucose 11/15/2022   Prostate cancer screening 11/15/2022    Chronic systolic heart failure (HCC) 12/15/2021   Chronic combined systolic and diastolic CHF (congestive heart failure) (HCC) 12/15/2021   Nasal sore 12/15/2021   Syncope 12/07/2021   Ventricular tachycardia (HCC) 12/07/2021   Typical atrial flutter (HCC) 09/24/2021   Secondary hypercoagulable state (HCC) 09/24/2021   Abnormal x-ray of neck 06/16/2021   Abnormal MRI, lumbar spine 10/23/2020   Chronic low back pain 10/23/2020   Neuropathy 10/23/2020   Coronary artery disease of native artery of native heart with stable angina pectoris (HCC) 10/23/2020   Impingement syndrome of left shoulder region 08/22/2020   Pain in joint of left shoulder 07/24/2020   Chronic left shoulder pain 07/11/2020   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   Anemia 12/13/2019   Chest pain of uncertain etiology 10/24/2019   PVC (premature ventricular contraction) 10/24/2019   Gastric ulcer without hemorrhage or perforation 09/28/2019   COVID-19 virus detected 07/12/2019   Elevated troponin 07/07/2019   Elevated serum protein level 07/07/2019   Postherpetic neuralgia 08/17/2018   Benign prostatic hyperplasia 08/01/2018   Hip pain 06/29/2018   Aortic atherosclerosis (HCC) 09/22/2016   Fatigue 06/03/2016   Lower urinary tract symptoms (LUTS) 06/03/2016   Constipation 03/10/2016   Lumbar radiculopathy 02/20/2016   Encounter for immunization 11/26/2015   Insomnia 06/10/2015   External hemorrhoid 03/07/2015   Health care maintenance 01/09/2014   Carpal tunnel syndrome of left wrist 01/03/2013   Hyperlipidemia 11/09/2012   Vitamin D deficiency 11/09/2012   Essential hypertension 11/06/2012   Tobacco  abuse 11/06/2012   Seasonal allergies 11/06/2012   Erectile dysfunction 11/06/2012   GERD (gastroesophageal reflux disease) 11/06/2012   DDD (degenerative disc disease), thoracolumbar 11/06/2012   Lactose intolerance 11/06/2012   Diverticulosis of colon without hemorrhage 11/06/2012   Diverticulosis of colon  11/06/2012   Degeneration of thoracolumbar intervertebral disc 11/06/2012   Gastroesophageal reflux disease 11/06/2012   Tobacco user 11/06/2012    Current Outpatient Medications:    amiodarone (PACERONE) 200 MG tablet, TAKE 1 TABLET(200 MG) BY MOUTH DAILY, Disp: 30 tablet, Rfl: 5   apixaban (ELIQUIS) 5 MG TABS tablet, Take 1 tablet (5 mg total) by mouth 2 (two) times daily., Disp: 60 tablet, Rfl: 3   calcium carbonate (OS-CAL - DOSED IN MG OF ELEMENTAL CALCIUM) 1250 (500 Ca) MG tablet, Take 1 tablet (500 mg of elemental calcium total) by mouth daily with breakfast., Disp: 30 tablet, Rfl: 11   carvedilol (COREG) 6.25 MG tablet, Take 1 tablet (6.25 mg total) by mouth 2 (two) times daily with a meal., Disp: 180 tablet, Rfl: 3   Cholecalciferol (VITAMIN D-3) 125 MCG (5000 UT) TABS, Take 1 tablet by mouth daily., Disp: 30 tablet, Rfl: 11   ezetimibe (ZETIA) 10 MG tablet, Take 1 tablet (10 mg total) by mouth daily., Disp: 90 tablet, Rfl: 3   FARXIGA 10 MG TABS tablet, Take 1 tablet (10 mg total) by mouth daily., Disp: 90 tablet, Rfl: 3   ferrous sulfate 325 (65 FE) MG tablet, Take 325 mg by mouth daily with breakfast., Disp: , Rfl:    folic acid (FOLVITE) 1 MG tablet, TAKE 1 TABLET(1 MG) BY MOUTH DAILY, Disp: 30 tablet, Rfl: 11   gabapentin (NEURONTIN) 300 MG capsule, Take 3 capsules (900 mg total) by mouth daily., Disp: 270 capsule, Rfl: 3   methotrexate (RHEUMATREX) 2.5 MG tablet, TAKE 20MG (8 TABLETS) EVERY WEDNESDAY. PROTECT FROM LIGHT AS DIRECTED, Disp: 150 tablet, Rfl: 0   Multiple Vitamin (MULTIVITAMIN) tablet, Take 1 tablet by mouth daily., Disp: , Rfl:    mupirocin ointment (BACTROBAN) 2 %, Apply 1 Application topically as needed., Disp: , Rfl:    pantoprazole (PROTONIX) 40 MG tablet, Take 1 tablet (40 mg total) by mouth daily. 30 min before food, Disp: 30 tablet, Rfl: 0   sacubitril-valsartan (ENTRESTO) 24-26 MG, Take 1 tablet by mouth 2 (two) times daily., Disp: 180 tablet, Rfl: 3    spironolactone (ALDACTONE) 25 MG tablet, Take 1 tablet (25 mg total) by mouth daily., Disp: 90 tablet, Rfl: 0 Allergies  Allergen Reactions   Crestor [Rosuvastatin Calcium] Other (See Comments)    Elevated muscle enzymes    Lipitor [Atorvastatin Calcium] Other (See Comments)    Elevated muscle enzymes      Social History   Socioeconomic History   Marital status: Married    Spouse name: Gwendolyn   Number of children: 2   Years of education: Not on file   Highest education level: High school graduate  Occupational History   Occupation: Retired    Comment: Semi  Tobacco Use   Smoking status: Former    Current packs/day: 0.00    Average packs/day: 0.5 packs/day for 49.0 years (24.5 ttl pk-yrs)    Types: Cigarettes    Start date: 07/06/1971    Quit date: 06/26/2020    Years since quitting: 2.8   Smokeless tobacco: Never   Tobacco comments:    Former smoker 09/24/21  Vaping Use   Vaping status: Never Used  Substance and Sexual Activity   Alcohol use:  No    Alcohol/week: 0.0 standard drinks of alcohol   Drug use: No   Sexual activity: Not on file  Other Topics Concern   Not on file  Social History Narrative   Married    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    On child    1 son, 1 daughter   Machine op   Owns guns    Wears seat belt, safe in relationship    Smoker    Retired 06/27/2019   Social Determinants of Health   Financial Resource Strain: Low Risk  (11/22/2022)   Overall Financial Resource Strain (CARDIA)    Difficulty of Paying Living Expenses: Not hard at all  Food Insecurity: No Food Insecurity (11/22/2022)   Hunger Vital Sign    Worried About Running Out of Food in the Last Year: Never true    Ran Out of Food in the Last Year: Never true  Transportation Needs: No Transportation Needs (11/22/2022)   PRAPARE - Administrator, Civil Service (Medical): No    Lack of Transportation (Non-Medical): No  Physical Activity: Sufficiently Active (11/22/2022)   Exercise Vital  Sign    Days of Exercise per Week: 3 days    Minutes of Exercise per Session: 60 min  Stress: No Stress Concern Present (11/22/2022)   [employer] of Occupational Health - Occupational Stress Questionnaire    Feeling of Stress : Not at all  Social Connections: Socially Integrated (11/22/2022)   Social Connection and Isolation Panel [NHANES]    Frequency of Communication with Friends and Family: Once a week    Frequency of Social Gatherings with Friends and Family: More than three times a week    Attends Religious Services: More than 4 times per year    Active Member of [employer] or Organizations: Yes    Attends Banker Meetings: Never    Marital Status: Married  Catering manager Violence: Not At Risk (11/22/2022)   Humiliation, Afraid, Rape, and Kick questionnaire    Fear of Current or Ex-Partner: No    Emotionally Abused: No    Physically Abused: No    Sexually Abused: No    Physical Exam      Future Appointments  Date Time Provider Department Center  05/04/2023  8:45 AM Clerance Lav, DPM [facility]  05/30/2023  9:20 AM [name], Bevelyn Buckles, MD [facility] None  06/10/2023  7:00 AM [facility] DEVICE REMOTES [facility]  08/04/2023  8:40 AM Dana Allan, MD [facility]  09/09/2023  7:00 AM [facility] DEVICE REMOTES [facility]  11/23/2023  8:30 AM [facility] ANNUAL WELLNESS VISIT [facility]  12/09/2023  7:00 AM [facility] DEVICE REMOTES [facility]  03/09/2024  7:00 AM [facility] DEVICE REMOTES [facility]  06/08/2024  7:00 AM [facility] DEVICE REMOTES [facility]

## 2023-04-25 ENCOUNTER — Other Ambulatory Visit (HOSPITAL_COMMUNITY): Payer: Self-pay | Admitting: *Deleted

## 2023-04-25 DIAGNOSIS — R109 Unspecified abdominal pain: Secondary | ICD-10-CM

## 2023-04-25 DIAGNOSIS — K259 Gastric ulcer, unspecified as acute or chronic, without hemorrhage or perforation: Secondary | ICD-10-CM

## 2023-04-25 MED ORDER — PANTOPRAZOLE SODIUM 40 MG PO TBEC: 40.0000 mg | DELAYED_RELEASE_TABLET | Freq: Every day | ORAL | 6 refills | Status: DC

## 2023-04-28 ENCOUNTER — Other Ambulatory Visit (HOSPITAL_COMMUNITY): Payer: Self-pay | Admitting: Emergency Medicine

## 2023-04-28 NOTE — Progress Notes (Signed)
Paramedicine Encounter    Patient ID: John Keith, male    DOB: 16-Jan-1951, 72 y.o.   MRN: [id number]   Complaints - none   Assessment - clear lung sounds, no pedal edema    Compliance with meds - no missed medications  Pill box filled - for 2x weeks   Refills needed - Amiodarone, Ezetimibe, folic acid, Farxiga, entresto  Meds changes since last visit - none    Social changes - increased work load due to decreased staffing     VISIT SUMMARY**  Met with John Keith in the home today. Pt denied any palpitations, chest pain, shortness of breath, or dizziness. Pt stated that he had been working more shifts, but managing his stress well. Pt's pill box was filled for 2x weeks. Upcoming appointments reviewed. Will follow up in 2x weeks.   BP 125/73   Pulse 72   Resp 16   Wt 237 lb 7 oz (107.7 kg)   SpO2 97%   BMI 25.44 kg/m  Weight yesterday-238 lbs  Last visit weight-237 lbs    [name] [facility] EMT-P Community Paramedic  [phone]    ACTION: Home visit completed     Patient Care Team: Dana Allan, MD as PCP - General (Family Medicine) Regan Lemming, MD as PCP - Electrophysiology (Cardiology) [name], Bevelyn Buckles, MD as PCP - Cardiology (Cardiology) Pincus Sanes, MD as Consulting Physician (Internal Medicine) Chilton Si, MD as Attending Physician (Cardiology) Regan Lemming, MD as Consulting Physician (Cardiology)  Patient Active Problem List   Diagnosis Date Noted   Dermatitis 01/31/2023   Onychomycosis 01/31/2023   Statin myopathy 11/15/2022   Abnormal glucose 11/15/2022   Prostate cancer screening 11/15/2022   Chronic systolic heart failure (HCC) 12/15/2021   Chronic combined systolic and diastolic CHF (congestive heart failure) (HCC) 12/15/2021   Nasal sore 12/15/2021   Syncope 12/07/2021   Ventricular tachycardia (HCC) 12/07/2021   Typical atrial flutter (HCC) 09/24/2021   Secondary hypercoagulable state (HCC) 09/24/2021    Abnormal x-ray of neck 06/16/2021   Abnormal MRI, lumbar spine 10/23/2020   Chronic low back pain 10/23/2020   Neuropathy 10/23/2020   Coronary artery disease of native artery of native heart with stable angina pectoris (HCC) 10/23/2020   Impingement syndrome of left shoulder region 08/22/2020   Pain in joint of left shoulder 07/24/2020   Chronic left shoulder pain 07/11/2020   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   Anemia 12/13/2019   Chest pain of uncertain etiology 10/24/2019   PVC (premature ventricular contraction) 10/24/2019   Gastric ulcer without hemorrhage or perforation 09/28/2019   COVID-19 virus detected 07/12/2019   Elevated troponin 07/07/2019   Elevated serum protein level 07/07/2019   Postherpetic neuralgia 08/17/2018   Benign prostatic hyperplasia 08/01/2018   Hip pain 06/29/2018   Aortic atherosclerosis (HCC) 09/22/2016   Fatigue 06/03/2016   Lower urinary tract symptoms (LUTS) 06/03/2016   Constipation 03/10/2016   Lumbar radiculopathy 02/20/2016   Encounter for immunization 11/26/2015   Insomnia 06/10/2015   External hemorrhoid 03/07/2015   Health care maintenance 01/09/2014   Carpal tunnel syndrome of left wrist 01/03/2013   Hyperlipidemia 11/09/2012   Vitamin D deficiency 11/09/2012   Essential hypertension 11/06/2012   Tobacco abuse 11/06/2012   Seasonal allergies 11/06/2012   Erectile dysfunction 11/06/2012   GERD (gastroesophageal reflux disease) 11/06/2012   DDD (degenerative disc disease), thoracolumbar 11/06/2012   Lactose intolerance 11/06/2012   Diverticulosis of colon without hemorrhage 11/06/2012   Diverticulosis of colon 11/06/2012  Degeneration of thoracolumbar intervertebral disc 11/06/2012   Gastroesophageal reflux disease 11/06/2012   Tobacco user 11/06/2012    Current Outpatient Medications:    amiodarone (PACERONE) 200 MG tablet, TAKE 1 TABLET(200 MG) BY MOUTH DAILY, Disp: 30 tablet, Rfl: 5   apixaban (ELIQUIS) 5 MG TABS tablet, Take  1 tablet (5 mg total) by mouth 2 (two) times daily., Disp: 60 tablet, Rfl: 3   calcium carbonate (OS-CAL - DOSED IN MG OF ELEMENTAL CALCIUM) 1250 (500 Ca) MG tablet, Take 1 tablet (500 mg of elemental calcium total) by mouth daily with breakfast., Disp: 30 tablet, Rfl: 11   carvedilol (COREG) 6.25 MG tablet, Take 1 tablet (6.25 mg total) by mouth 2 (two) times daily with a meal., Disp: 180 tablet, Rfl: 3   Cholecalciferol (VITAMIN D-3) 125 MCG (5000 UT) TABS, Take 1 tablet by mouth daily., Disp: 30 tablet, Rfl: 11   ezetimibe (ZETIA) 10 MG tablet, Take 1 tablet (10 mg total) by mouth daily., Disp: 90 tablet, Rfl: 3   FARXIGA 10 MG TABS tablet, Take 1 tablet (10 mg total) by mouth daily., Disp: 90 tablet, Rfl: 3   ferrous sulfate 325 (65 FE) MG tablet, Take 325 mg by mouth daily with breakfast., Disp: , Rfl:    folic acid (FOLVITE) 1 MG tablet, TAKE 1 TABLET(1 MG) BY MOUTH DAILY, Disp: 30 tablet, Rfl: 11   gabapentin (NEURONTIN) 300 MG capsule, Take 3 capsules (900 mg total) by mouth daily., Disp: 270 capsule, Rfl: 3   methotrexate (RHEUMATREX) 2.5 MG tablet, TAKE 20MG (8 TABLETS) EVERY WEDNESDAY. PROTECT FROM LIGHT AS DIRECTED, Disp: 150 tablet, Rfl: 0   Multiple Vitamin (MULTIVITAMIN) tablet, Take 1 tablet by mouth daily., Disp: , Rfl:    mupirocin ointment (BACTROBAN) 2 %, Apply 1 Application topically as needed., Disp: , Rfl:    pantoprazole (PROTONIX) 40 MG tablet, Take 1 tablet (40 mg total) by mouth daily. 30 min before food, Disp: 30 tablet, Rfl: 6   sacubitril-valsartan (ENTRESTO) 24-26 MG, Take 1 tablet by mouth 2 (two) times daily., Disp: 180 tablet, Rfl: 3   spironolactone (ALDACTONE) 25 MG tablet, TAKE 1 TABLET(25 MG) BY MOUTH DAILY, Disp: 90 tablet, Rfl: 1 Allergies  Allergen Reactions   Crestor [Rosuvastatin Calcium] Other (See Comments)    Elevated muscle enzymes    Lipitor [Atorvastatin Calcium] Other (See Comments)    Elevated muscle enzymes      Social History    Socioeconomic History   Marital status: Married    Spouse name: John Keith   Number of children: 2   Years of education: Not on file   Highest education level: High school graduate  Occupational History   Occupation: Retired    Comment: Semi  Tobacco Use   Smoking status: Former    Current packs/day: 0.00    Average packs/day: 0.5 packs/day for 49.0 years (24.5 ttl pk-yrs)    Types: Cigarettes    Start date: 07/06/1971    Quit date: 06/26/2020    Years since quitting: 2.8   Smokeless tobacco: Never   Tobacco comments:    Former smoker 09/24/21  Vaping Use   Vaping status: Never Used  Substance and Sexual Activity   Alcohol use: No    Alcohol/week: 0.0 standard drinks of alcohol   Drug use: No   Sexual activity: Not on file  Other Topics Concern   Not on file  Social History Narrative   Married    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    On child  1 son, 1 daughter   Machine op   Owns guns    Wears seat belt, safe in relationship    Smoker    Retired 06/27/2019   Social Determinants of Health   Financial Resource Strain: Low Risk  (11/22/2022)   Overall Financial Resource Strain (CARDIA)    Difficulty of Paying Living Expenses: Not hard at all  Food Insecurity: No Food Insecurity (11/22/2022)   Hunger Vital Sign    Worried About Running Out of Food in the Last Year: Never true    Ran Out of Food in the Last Year: Never true  Transportation Needs: No Transportation Needs (11/22/2022)   PRAPARE - Administrator, Civil Service (Medical): No    Lack of Transportation (Non-Medical): No  Physical Activity: Sufficiently Active (11/22/2022)   Exercise Vital Sign    Days of Exercise per Week: 3 days    Minutes of Exercise per Session: 60 min  Stress: No Stress Concern Present (11/22/2022)   [employer] of Occupational Health - Occupational Stress Questionnaire    Feeling of Stress : Not at all  Social Connections: Socially Integrated (11/22/2022)   Social Connection and  Isolation Panel [NHANES]    Frequency of Communication with Friends and Family: Once a week    Frequency of Social Gatherings with Friends and Family: More than three times a week    Attends Religious Services: More than 4 times per year    Active Member of [employer] or Organizations: Yes    Attends Banker Meetings: Never    Marital Status: Married  Catering manager Violence: Not At Risk (11/22/2022)   Humiliation, Afraid, Rape, and Kick questionnaire    Fear of Current or Ex-Partner: No    Emotionally Abused: No    Physically Abused: No    Sexually Abused: No    Physical Exam      Future Appointments  Date Time Provider Department Center  05/04/2023  8:45 AM Clerance Lav, DPM [facility]  05/30/2023  9:20 AM [name], Bevelyn Buckles, MD [facility] None  06/10/2023  7:00 AM [facility] DEVICE REMOTES [facility]  08/04/2023  8:40 AM Dana Allan, MD [facility]  09/09/2023  7:00 AM [facility] DEVICE REMOTES [facility]  11/23/2023  8:30 AM [facility] ANNUAL WELLNESS VISIT [facility]  12/09/2023  7:00 AM [facility] DEVICE REMOTES [facility]  03/09/2024  7:00 AM [facility] DEVICE REMOTES [facility]  06/08/2024  7:00 AM [facility] DEVICE REMOTES [facility]

## 2023-05-03 ENCOUNTER — Other Ambulatory Visit (HOSPITAL_COMMUNITY): Payer: Self-pay

## 2023-05-03 ENCOUNTER — Other Ambulatory Visit: Payer: Self-pay

## 2023-05-03 MED ORDER — FARXIGA 10 MG PO TABS
10.0000 mg | ORAL_TABLET | Freq: Every day | ORAL | 3 refills | Status: DC
Start: 2023-05-03 — End: 2023-08-17
  Filled 2023-05-03 – 2023-07-27 (×2): qty 90, 90d supply, fill #0

## 2023-05-04 ENCOUNTER — Other Ambulatory Visit: Payer: Self-pay

## 2023-05-04 ENCOUNTER — Ambulatory Visit: Payer: PPO | Admitting: Podiatry

## 2023-05-04 DIAGNOSIS — B351 Tinea unguium: Secondary | ICD-10-CM | POA: Diagnosis not present

## 2023-05-04 DIAGNOSIS — M79675 Pain in left toe(s): Secondary | ICD-10-CM

## 2023-05-04 DIAGNOSIS — M79674 Pain in right toe(s): Secondary | ICD-10-CM | POA: Diagnosis not present

## 2023-05-04 NOTE — Progress Notes (Signed)
Subjective:  Patient ID: John Keith, male    DOB: 28-Sep-1950,  MRN: [id number]   John Keith presents to clinic today for:  Chief Complaint  Patient presents with   RFC    Nail Trim  . Patient notes nails are thick, discolored, elongated and painful in shoegear when trying to ambulate.    PCP is Dana Allan, MD.  Past Medical History:  Diagnosis Date   Allergy    Arthritis    Atrial flutter [facility])    Carpal tunnel syndrome on left    Chest pain of uncertain etiology 10/24/2019   COVID-19    07/07/19   DDD (degenerative disc disease), thoracolumbar    multilevel   Degenerative joint disease of left shoulder    02/2011    Diverticulosis    left colon (2008)    ED (erectile dysfunction)    02/2011    GERD (gastroesophageal reflux disease)    H. pylori infection    Hyperlipidemia    Hypertension    Insomnia    Lactose intolerance    Low back pain    PVC (premature ventricular contraction) 10/24/2019   Shingles    07/2018   Smoking    smoking since age 41 y.o   Toe fracture, left    4th in 2014   Vitamin D deficiency     Past Surgical History:  Procedure Laterality Date   APPENDECTOMY     BACK SURGERY     x 2 10991 and 1995 in [place]    CARDIAC CATHETERIZATION     1999 nl   CARDIOVERSION N/A 10/19/2021   Procedure: CARDIOVERSION;  Surgeon: Christell Constant, MD;  Location: [facility] ENDOSCOPY;  Service: Cardiovascular;  Laterality: N/A;   CARPAL TUNNEL RELEASE Left    COLONOSCOPY     2008 diverticulosis   ESOPHAGOGASTRODUODENOSCOPY     h/o +h. pylori   ICD IMPLANT N/A 12/10/2021   Procedure: ICD IMPLANT;  Surgeon: Regan Lemming, MD;  Location: [facility] INVASIVE CV LAB;  Service: Cardiovascular;  Laterality: N/A;   LEFT HEART CATH AND CORONARY ANGIOGRAPHY N/A 12/08/2021   Procedure: LEFT HEART CATH AND CORONARY ANGIOGRAPHY;  Surgeon: Corky Crafts, MD;  Location: [facility] INVASIVE CV LAB;  Service: Cardiovascular;  Laterality: N/A;   OTHER SURGICAL  HISTORY     heart catherization 1999 normal    SPINE SURGERY     x 2     Allergies  Allergen Reactions   Crestor [Rosuvastatin Calcium] Other (See Comments)    Elevated muscle enzymes    Lipitor [Atorvastatin Calcium] Other (See Comments)    Elevated muscle enzymes     Review of Systems: Negative except as noted in the HPI.  Objective:  John Keith is a pleasant 72 y.o. male in NAD. AAO x 3.  Vascular Examination: Capillary refill time is 3-5 seconds to toes bilateral. Palpable pedal pulses b/l LE. Digital hair present b/l.  Skin temperature gradient WNL b/l. No varicosities b/l. No cyanosis noted b/l.   Dermatological Examination: Pedal skin with normal turgor, texture and tone b/l. No open wounds. No interdigital macerations b/l. Toenails x10 are 3mm thick, discolored, dystrophic with subungual debris. There is pain with compression of the nail plates.  They are elongated x10.  Previous PNA sites on hallux toes are healed well with no signs of infection and no pain in the area.     Latest Ref Rng & Units 10/29/2022  9:53 AM  Hemoglobin A1C  Hemoglobin-A1c 4.6 - 6.5 % 5.6    Assessment/Plan: 1. Pain due to onychomycosis of toenails of both feet     The mycotic toenails were sharply debrided x10 with sterile nail nippers and a power debriding burr to decrease bulk/thickness and length.    Return in about 3 months (around 08/04/2023) for RFC.   Clerance Lav, DPM, FACFAS [facility]     [address] [place], [place] [address]                Office [phone]  Fax [phone]

## 2023-05-05 ENCOUNTER — Other Ambulatory Visit (HOSPITAL_COMMUNITY): Payer: Self-pay

## 2023-05-12 ENCOUNTER — Telehealth (HOSPITAL_COMMUNITY): Payer: Self-pay

## 2023-05-12 ENCOUNTER — Other Ambulatory Visit (HOSPITAL_COMMUNITY): Payer: Self-pay | Admitting: Emergency Medicine

## 2023-05-12 ENCOUNTER — Other Ambulatory Visit (HOSPITAL_COMMUNITY): Payer: Self-pay

## 2023-05-12 ENCOUNTER — Other Ambulatory Visit: Payer: Self-pay

## 2023-05-12 NOTE — Telephone Encounter (Signed)
Advanced Heart Failure Patient Advocate Encounter  The patient was approved for a [employer] grant that will help cover the cost of Carvedilol, Entresto, Farxiga, Spironolactone.  Total amount awarded, $10,000.  Effective: 04/27/23 - 04/25/2024.  BIN [id number] PCN [id number] [id number] ID [id number]  Pharmacy provided with approval and processing information. Patient informed via paramedicine.  Burnell Blanks, CPhT Rx Patient Advocate Phone: [phone]

## 2023-05-12 NOTE — Progress Notes (Signed)
Paramedicine Encounter    Patient ID: John Keith, male    DOB: 14-Aug-1950, 72 y.o.   MRN: [id number]   Complaints - none  Assessment - lung sounds clear, no edema noted    Compliance with meds - no missed medication doses   Pill box filled - for 2x weeks   Refills needed - methotrexate, iron, entresto  Meds changes since last visit - none    Social changes - none     VISIT SUMMARY**  Met with John Keith in the home. Pt denied any complaints at this time, denied chest pain, palpitations, shortness of breath, or dizziness. Pt reports continuing his regular routine without and difficulties. Pt was assessed with no acute abnormalities noted. Pt's pill box was filled for 2x weeks. Upcoming appointments were reviewed. Will follow up in 2x weeks.   BP 110/64   Pulse (!) 52   Resp 16   Wt 237 lb 12.8 oz (107.9 kg)   SpO2 96%   BMI 25.48 kg/m  Weight yesterday-238.4 lbs  6 Last visit weight-237 lbs    [name] Setting EMT-P Community Paramedic  [phone]    ACTION: Home visit completed     Patient Care Team: Dana Allan, MD as PCP - General (Family Medicine) Regan Lemming, MD as PCP - Electrophysiology (Cardiology) [name], Bevelyn Buckles, MD as PCP - Cardiology (Cardiology) Pincus Sanes, MD as Consulting Physician (Internal Medicine) Chilton Si, MD as Attending Physician (Cardiology) Regan Lemming, MD as Consulting Physician (Cardiology)  Patient Active Problem List   Diagnosis Date Noted   Dermatitis 01/31/2023   Onychomycosis 01/31/2023   Statin myopathy 11/15/2022   Abnormal glucose 11/15/2022   Prostate cancer screening 11/15/2022   Chronic systolic heart failure (HCC) 12/15/2021   Chronic combined systolic and diastolic CHF (congestive heart failure) (HCC) 12/15/2021   Nasal sore 12/15/2021   Syncope 12/07/2021   Ventricular tachycardia (HCC) 12/07/2021   Typical atrial flutter (HCC) 09/24/2021   Secondary hypercoagulable  state (HCC) 09/24/2021   Abnormal x-ray of neck 06/16/2021   Abnormal MRI, lumbar spine 10/23/2020   Chronic low back pain 10/23/2020   Neuropathy 10/23/2020   Coronary artery disease of native artery of native heart with stable angina pectoris (HCC) 10/23/2020   Impingement syndrome of left shoulder region 08/22/2020   Pain in joint of left shoulder 07/24/2020   Chronic left shoulder pain 07/11/2020   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   Anemia 12/13/2019   Chest pain of uncertain etiology 10/24/2019   PVC (premature ventricular contraction) 10/24/2019   Gastric ulcer without hemorrhage or perforation 09/28/2019   COVID-19 virus detected 07/12/2019   Elevated troponin 07/07/2019   Elevated serum protein level 07/07/2019   Postherpetic neuralgia 08/17/2018   Benign prostatic hyperplasia 08/01/2018   Hip pain 06/29/2018   Aortic atherosclerosis (HCC) 09/22/2016   Fatigue 06/03/2016   Lower urinary tract symptoms (LUTS) 06/03/2016   Constipation 03/10/2016   Lumbar radiculopathy 02/20/2016   Encounter for immunization 11/26/2015   Insomnia 06/10/2015   External hemorrhoid 03/07/2015   Health care maintenance 01/09/2014   Carpal tunnel syndrome of left wrist 01/03/2013   Hyperlipidemia 11/09/2012   Vitamin D deficiency 11/09/2012   Essential hypertension 11/06/2012   Tobacco abuse 11/06/2012   Seasonal allergies 11/06/2012   Erectile dysfunction 11/06/2012   GERD (gastroesophageal reflux disease) 11/06/2012   DDD (degenerative disc disease), thoracolumbar 11/06/2012   Lactose intolerance 11/06/2012   Diverticulosis of colon without hemorrhage 11/06/2012   Diverticulosis of colon 11/06/2012  Degeneration of thoracolumbar intervertebral disc 11/06/2012   Gastroesophageal reflux disease 11/06/2012   Tobacco user 11/06/2012    Current Outpatient Medications:    amiodarone (PACERONE) 200 MG tablet, TAKE 1 TABLET(200 MG) BY MOUTH DAILY, Disp: 30 tablet, Rfl: 5   apixaban  (ELIQUIS) 5 MG TABS tablet, Take 1 tablet (5 mg total) by mouth 2 (two) times daily., Disp: 60 tablet, Rfl: 3   calcium carbonate (OS-CAL - DOSED IN MG OF ELEMENTAL CALCIUM) 1250 (500 Ca) MG tablet, Take 1 tablet (500 mg of elemental calcium total) by mouth daily with breakfast., Disp: 30 tablet, Rfl: 11   carvedilol (COREG) 6.25 MG tablet, Take 1 tablet (6.25 mg total) by mouth 2 (two) times daily with a meal., Disp: 180 tablet, Rfl: 3   Cholecalciferol (VITAMIN D-3) 125 MCG (5000 UT) TABS, Take 1 tablet by mouth daily., Disp: 30 tablet, Rfl: 11   ezetimibe (ZETIA) 10 MG tablet, Take 1 tablet (10 mg total) by mouth daily., Disp: 90 tablet, Rfl: 3   FARXIGA 10 MG TABS tablet, Take 1 tablet (10 mg total) by mouth daily., Disp: 90 tablet, Rfl: 3   ferrous sulfate 325 (65 FE) MG tablet, Take 325 mg by mouth daily with breakfast., Disp: , Rfl:    folic acid (FOLVITE) 1 MG tablet, TAKE 1 TABLET(1 MG) BY MOUTH DAILY, Disp: 30 tablet, Rfl: 11   gabapentin (NEURONTIN) 300 MG capsule, Take 3 capsules (900 mg total) by mouth daily., Disp: 270 capsule, Rfl: 3   methotrexate (RHEUMATREX) 2.5 MG tablet, TAKE 20MG (8 TABLETS) EVERY WEDNESDAY. PROTECT FROM LIGHT AS DIRECTED, Disp: 150 tablet, Rfl: 0   Multiple Vitamin (MULTIVITAMIN) tablet, Take 1 tablet by mouth daily., Disp: , Rfl:    mupirocin ointment (BACTROBAN) 2 %, Apply 1 Application topically as needed., Disp: , Rfl:    pantoprazole (PROTONIX) 40 MG tablet, Take 1 tablet (40 mg total) by mouth daily. 30 min before food, Disp: 30 tablet, Rfl: 6   sacubitril-valsartan (ENTRESTO) 24-26 MG, Take 1 tablet by mouth 2 (two) times daily., Disp: 180 tablet, Rfl: 3   spironolactone (ALDACTONE) 25 MG tablet, TAKE 1 TABLET(25 MG) BY MOUTH DAILY, Disp: 90 tablet, Rfl: 1 Allergies  Allergen Reactions   Crestor [Rosuvastatin Calcium] Other (See Comments)    Elevated muscle enzymes    Lipitor [Atorvastatin Calcium] Other (See Comments)    Elevated muscle enzymes       Social History   Socioeconomic History   Marital status: Married    Spouse name: Proofreader   Number of children: 2   Years of education: Not on file   Highest education level: High school graduate  Occupational History   Occupation: Retired    Comment: Semi  Tobacco Use   Smoking status: Former    Current packs/day: 0.00    Average packs/day: 0.5 packs/day for 49.0 years (24.5 ttl pk-yrs)    Types: Cigarettes    Start date: 07/06/1971    Quit date: 06/26/2020    Years since quitting: 2.8   Smokeless tobacco: Never   Tobacco comments:    Former smoker 09/24/21  Vaping Use   Vaping status: Never Used  Substance and Sexual Activity   Alcohol use: No    Alcohol/week: 0.0 standard drinks of alcohol   Drug use: No   Sexual activity: Not on file  Other Topics Concern   Not on file  Social History Narrative   Married    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    On child  1 son, 1 daughter   Machine op   Owns guns    Wears seat belt, safe in relationship    Smoker    Retired 06/27/2019   Social Determinants of Health   Financial Resource Strain: Low Risk  (11/22/2022)   Overall Financial Resource Strain (CARDIA)    Difficulty of Paying Living Expenses: Not hard at all  Food Insecurity: No Food Insecurity (11/22/2022)   Hunger Vital Sign    Worried About Running Out of Food in the Last Year: Never true    Ran Out of Food in the Last Year: Never true  Transportation Needs: No Transportation Needs (11/22/2022)   PRAPARE - Administrator, Civil Service (Medical): No    Lack of Transportation (Non-Medical): No  Physical Activity: Sufficiently Active (11/22/2022)   Exercise Vital Sign    Days of Exercise per Week: 3 days    Minutes of Exercise per Session: 60 min  Stress: No Stress Concern Present (11/22/2022)   [employer] of Occupational Health - Occupational Stress Questionnaire    Feeling of Stress : Not at all  Social Connections: Socially Integrated (11/22/2022)    Social Connection and Isolation Panel [NHANES]    Frequency of Communication with Friends and Family: Once a week    Frequency of Social Gatherings with Friends and Family: More than three times a week    Attends Religious Services: More than 4 times per year    Active Member of [employer] or Organizations: Yes    Attends Banker Meetings: Never    Marital Status: Married  Catering manager Violence: Not At Risk (11/22/2022)   Humiliation, Afraid, Rape, and Kick questionnaire    Fear of Current or Ex-Partner: No    Emotionally Abused: No    Physically Abused: No    Sexually Abused: No    Physical Exam      Future Appointments  Date Time Provider Department Center  05/30/2023  9:20 AM [name], Bevelyn Buckles, MD [facility] None  06/10/2023  7:00 AM [facility] DEVICE REMOTES [facility]  08/04/2023  8:40 AM Dana Allan, MD [facility]  08/11/2023  8:15 AM McCaughan, Dia D, DPM [facility]  09/09/2023  7:00 AM [facility] DEVICE REMOTES [facility]  11/23/2023  8:30 AM [facility] ANNUAL WELLNESS VISIT [facility]  12/09/2023  7:00 AM [facility] DEVICE REMOTES [facility]  03/09/2024  7:00 AM [facility] DEVICE REMOTES [facility]  06/08/2024  7:00 AM [facility] DEVICE REMOTES [facility]

## 2023-05-13 ENCOUNTER — Other Ambulatory Visit (HOSPITAL_COMMUNITY): Payer: Self-pay

## 2023-05-16 ENCOUNTER — Telehealth (HOSPITAL_COMMUNITY): Payer: Self-pay | Admitting: Emergency Medicine

## 2023-05-16 NOTE — Telephone Encounter (Signed)
Contacted John Keith this morning to confirm he had received his Entresto. Pt advised that he has not, but is keeping an eye out for it.   I contacted [employer] and they advised that it is "In Progress".   [name] Setting EMT-P Community Paramedic  [phone]

## 2023-05-17 ENCOUNTER — Other Ambulatory Visit: Payer: Self-pay

## 2023-05-18 ENCOUNTER — Other Ambulatory Visit (HOSPITAL_COMMUNITY): Payer: Self-pay

## 2023-05-18 ENCOUNTER — Telehealth (HOSPITAL_COMMUNITY): Payer: Self-pay | Admitting: Emergency Medicine

## 2023-05-18 NOTE — Telephone Encounter (Signed)
Received a note stating that John Keith had not received his Entresto. I contacted [facility] and relayed the information. They advised that the prescription would be delivered tomorrow. Will follow up with [name] tomorrow to confirm.   [name] Setting EMT-P Community Paramedic  [phone]

## 2023-05-26 ENCOUNTER — Other Ambulatory Visit (HOSPITAL_COMMUNITY): Payer: Self-pay | Admitting: Emergency Medicine

## 2023-05-26 NOTE — Progress Notes (Signed)
Paramedicine Encounter    Patient ID: John Keith, male    DOB: Jun 29, 1951, 72 y.o.   MRN: [id number]   Complaints - decreased appetite  Assessment - lung sounds clear, no pedal edema.   Compliance with meds - one missed evening dose   Pill box filled - for 2x weeks   Refills needed -  vitamin D, folic acid, amiodarone - called in, ready on Sat  Meds changes since last visit -  none   Social changes - decreased appetite    VISIT SUMMARY**  I met with Mr. John Keith in the home. Pt reported that he has been noting a decreased appetite. Pt requested a review of his medications to see if one of them was causing this new symptom. I advised pt to follow up with his PCP regarding same and that we would touch base with Dr. Gala Romney on same at his appointment on Monday. Pt denied any shortness of breath, chest pain, palpitations, or dizziness. Pt reported feeling well. Pt's pill box was filled for 2x weeks. Upcoming appointments were reviewed. Will follow up in the home in 2x weeks. Will follow up in clinic with pt on Monday.    BP 126/68   Pulse 76   Resp 16   Wt 235 lb 3.2 oz (106.7 kg)   SpO2 93%   BMI 25.20 kg/m  Weight yesterday-235.4 lbs  Last visit weight-237 lbs   [name] Setting EMT-P Community Paramedic  [phone]    ACTION: Home visit completed     Patient Care Team: Dana Allan, MD as PCP - General (Family Medicine) Regan Lemming, MD as PCP - Electrophysiology (Cardiology) [name], Bevelyn Buckles, MD as PCP - Cardiology (Cardiology) Pincus Sanes, MD as Consulting Physician (Internal Medicine) Chilton Si, MD as Attending Physician (Cardiology) Regan Lemming, MD as Consulting Physician (Cardiology)  Patient Active Problem List   Diagnosis Date Noted   Dermatitis 01/31/2023   Onychomycosis 01/31/2023   Statin myopathy 11/15/2022   Abnormal glucose 11/15/2022   Prostate cancer screening 11/15/2022   Chronic systolic heart failure  (HCC) 65/78/4696   Chronic combined systolic and diastolic CHF (congestive heart failure) (HCC) 12/15/2021   Nasal sore 12/15/2021   Syncope 12/07/2021   Ventricular tachycardia (HCC) 12/07/2021   Typical atrial flutter (HCC) 09/24/2021   Secondary hypercoagulable state (HCC) 09/24/2021   Abnormal x-ray of neck 06/16/2021   Abnormal MRI, lumbar spine 10/23/2020   Chronic low back pain 10/23/2020   Neuropathy 10/23/2020   Coronary artery disease of native artery of native heart with stable angina pectoris (HCC) 10/23/2020   Impingement syndrome of left shoulder region 08/22/2020   Pain in joint of left shoulder 07/24/2020   Chronic left shoulder pain 07/11/2020   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   Anemia 12/13/2019   Chest pain of uncertain etiology 10/24/2019   PVC (premature ventricular contraction) 10/24/2019   Gastric ulcer without hemorrhage or perforation 09/28/2019   COVID-19 virus detected 07/12/2019   Elevated troponin 07/07/2019   Elevated serum protein level 07/07/2019   Postherpetic neuralgia 08/17/2018   Benign prostatic hyperplasia 08/01/2018   Hip pain 06/29/2018   Aortic atherosclerosis (HCC) 09/22/2016   Fatigue 06/03/2016   Lower urinary tract symptoms (LUTS) 06/03/2016   Constipation 03/10/2016   Lumbar radiculopathy 02/20/2016   Encounter for immunization 11/26/2015   Insomnia 06/10/2015   External hemorrhoid 03/07/2015   Health care maintenance 01/09/2014   Carpal tunnel syndrome of left wrist 01/03/2013   Hyperlipidemia 11/09/2012   Vitamin  D deficiency 11/09/2012   Essential hypertension 11/06/2012   Tobacco abuse 11/06/2012   Seasonal allergies 11/06/2012   Erectile dysfunction 11/06/2012   GERD (gastroesophageal reflux disease) 11/06/2012   DDD (degenerative disc disease), thoracolumbar 11/06/2012   Lactose intolerance 11/06/2012   Diverticulosis of colon without hemorrhage 11/06/2012   Diverticulosis of colon 11/06/2012   Degeneration of  thoracolumbar intervertebral disc 11/06/2012   Gastroesophageal reflux disease 11/06/2012   Tobacco user 11/06/2012    Current Outpatient Medications:    amiodarone (PACERONE) 200 MG tablet, TAKE 1 TABLET(200 MG) BY MOUTH DAILY, Disp: 30 tablet, Rfl: 5   apixaban (ELIQUIS) 5 MG TABS tablet, Take 1 tablet (5 mg total) by mouth 2 (two) times daily., Disp: 60 tablet, Rfl: 3   calcium carbonate (OS-CAL - DOSED IN MG OF ELEMENTAL CALCIUM) 1250 (500 Ca) MG tablet, Take 1 tablet (500 mg of elemental calcium total) by mouth daily with breakfast., Disp: 30 tablet, Rfl: 11   carvedilol (COREG) 6.25 MG tablet, Take 1 tablet (6.25 mg total) by mouth 2 (two) times daily with a meal., Disp: 180 tablet, Rfl: 3   Cholecalciferol (VITAMIN D-3) 125 MCG (5000 UT) TABS, Take 1 tablet by mouth daily., Disp: 30 tablet, Rfl: 11   ezetimibe (ZETIA) 10 MG tablet, Take 1 tablet (10 mg total) by mouth daily., Disp: 90 tablet, Rfl: 3   FARXIGA 10 MG TABS tablet, Take 1 tablet (10 mg total) by mouth daily., Disp: 90 tablet, Rfl: 3   ferrous sulfate 325 (65 FE) MG tablet, Take 325 mg by mouth daily with breakfast., Disp: , Rfl:    folic acid (FOLVITE) 1 MG tablet, TAKE 1 TABLET(1 MG) BY MOUTH DAILY, Disp: 30 tablet, Rfl: 11   gabapentin (NEURONTIN) 300 MG capsule, Take 3 capsules (900 mg total) by mouth daily., Disp: 270 capsule, Rfl: 3   methotrexate (RHEUMATREX) 2.5 MG tablet, TAKE 20MG (8 TABLETS) EVERY WEDNESDAY. PROTECT FROM LIGHT AS DIRECTED, Disp: 150 tablet, Rfl: 0   Multiple Vitamin (MULTIVITAMIN) tablet, Take 1 tablet by mouth daily., Disp: , Rfl:    mupirocin ointment (BACTROBAN) 2 %, Apply 1 Application topically as needed., Disp: , Rfl:    pantoprazole (PROTONIX) 40 MG tablet, Take 1 tablet (40 mg total) by mouth daily. 30 min before food, Disp: 30 tablet, Rfl: 6   sacubitril-valsartan (ENTRESTO) 24-26 MG, Take 1 tablet by mouth 2 (two) times daily., Disp: 180 tablet, Rfl: 3   spironolactone (ALDACTONE) 25 MG  tablet, TAKE 1 TABLET(25 MG) BY MOUTH DAILY, Disp: 90 tablet, Rfl: 1 Allergies  Allergen Reactions   Crestor [Rosuvastatin Calcium] Other (See Comments)    Elevated muscle enzymes    Lipitor [Atorvastatin Calcium] Other (See Comments)    Elevated muscle enzymes      Social History   Socioeconomic History   Marital status: Married    Spouse name: Gwendolyn   Number of children: 2   Years of education: Not on file   Highest education level: High school graduate  Occupational History   Occupation: Retired    Comment: Semi  Tobacco Use   Smoking status: Former    Current packs/day: 0.00    Average packs/day: 0.5 packs/day for 49.0 years (24.5 ttl pk-yrs)    Types: Cigarettes    Start date: 07/06/1971    Quit date: 06/26/2020    Years since quitting: 2.9   Smokeless tobacco: Never   Tobacco comments:    Former smoker 09/24/21  Vaping Use   Vaping status: Never Used  Substance and Sexual Activity   Alcohol use: No    Alcohol/week: 0.0 standard drinks of alcohol   Drug use: No   Sexual activity: Not on file  Other Topics Concern   Not on file  Social History Narrative   Married    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    On child    1 son, 1 daughter   Machine op   Owns guns    Wears seat belt, safe in relationship    Smoker    Retired 06/27/2019   Social Determinants of Health   Financial Resource Strain: Low Risk  (11/22/2022)   Overall Financial Resource Strain (CARDIA)    Difficulty of Paying Living Expenses: Not hard at all  Food Insecurity: No Food Insecurity (11/22/2022)   Hunger Vital Sign    Worried About Running Out of Food in the Last Year: Never true    Ran Out of Food in the Last Year: Never true  Transportation Needs: No Transportation Needs (11/22/2022)   PRAPARE - Administrator, Civil Service (Medical): No    Lack of Transportation (Non-Medical): No  Physical Activity: Sufficiently Active (11/22/2022)   Exercise Vital Sign    Days of Exercise per Week: 3  days    Minutes of Exercise per Session: 60 min  Stress: No Stress Concern Present (11/22/2022)   [employer] of Occupational Health - Occupational Stress Questionnaire    Feeling of Stress : Not at all  Social Connections: Socially Integrated (11/22/2022)   Social Connection and Isolation Panel [NHANES]    Frequency of Communication with Friends and Family: Once a week    Frequency of Social Gatherings with Friends and Family: More than three times a week    Attends Religious Services: More than 4 times per year    Active Member of [employer] or Organizations: Yes    Attends Banker Meetings: Never    Marital Status: Married  Catering manager Violence: Not At Risk (11/22/2022)   Humiliation, Afraid, Rape, and Kick questionnaire    Fear of Current or Ex-Partner: No    Emotionally Abused: No    Physically Abused: No    Sexually Abused: No    Physical Exam      Future Appointments  Date Time Provider Department Center  05/30/2023  9:20 AM [name], Bevelyn Buckles, MD [facility] None  06/10/2023  7:00 AM [facility] DEVICE REMOTES [facility]  08/04/2023  8:40 AM Dana Allan, MD [facility]  08/11/2023  8:15 AM McCaughan, Dia D, DPM [facility]  09/09/2023  7:00 AM [facility] DEVICE REMOTES [facility]  11/23/2023  8:30 AM [facility] ANNUAL WELLNESS VISIT [facility]  12/09/2023  7:00 AM [facility] DEVICE REMOTES [facility]  03/09/2024  7:00 AM [facility] DEVICE REMOTES [facility]  06/08/2024  7:00 AM [facility] DEVICE REMOTES [facility]

## 2023-05-30 ENCOUNTER — Other Ambulatory Visit (HOSPITAL_COMMUNITY): Payer: Self-pay | Admitting: Emergency Medicine

## 2023-05-30 ENCOUNTER — Ambulatory Visit (HOSPITAL_COMMUNITY)
Admission: RE | Admit: 2023-05-30 | Discharge: 2023-05-30 | Disposition: A | Discharge status: Home / Self Care | Payer: PPO | Source: Ambulatory Visit | Attending: Internal Medicine | Admitting: Internal Medicine

## 2023-05-30 ENCOUNTER — Encounter (HOSPITAL_COMMUNITY): Payer: Self-pay | Admitting: Internal Medicine

## 2023-05-30 VITALS — BP 110/70 | HR 61 | Wt 244.6 lb

## 2023-05-30 DIAGNOSIS — Z79899 Other long term (current) drug therapy: Secondary | ICD-10-CM | POA: Diagnosis not present

## 2023-05-30 DIAGNOSIS — Z7901 Long term (current) use of anticoagulants: Secondary | ICD-10-CM | POA: Diagnosis not present

## 2023-05-30 DIAGNOSIS — D8685 Sarcoid myocarditis: Secondary | ICD-10-CM | POA: Diagnosis not present

## 2023-05-30 DIAGNOSIS — I2510 Atherosclerotic heart disease of native coronary artery without angina pectoris: Secondary | ICD-10-CM | POA: Insufficient documentation

## 2023-05-30 DIAGNOSIS — I5022 Chronic systolic (congestive) heart failure: Secondary | ICD-10-CM | POA: Insufficient documentation

## 2023-05-30 DIAGNOSIS — I4892 Unspecified atrial flutter: Secondary | ICD-10-CM | POA: Diagnosis not present

## 2023-05-30 DIAGNOSIS — G5602 Carpal tunnel syndrome, left upper limb: Secondary | ICD-10-CM | POA: Diagnosis not present

## 2023-05-30 DIAGNOSIS — I110 Hypertensive heart disease with heart failure: Secondary | ICD-10-CM | POA: Diagnosis not present

## 2023-05-30 DIAGNOSIS — Z87891 Personal history of nicotine dependence: Secondary | ICD-10-CM | POA: Insufficient documentation

## 2023-05-30 DIAGNOSIS — R630 Anorexia: Secondary | ICD-10-CM | POA: Diagnosis not present

## 2023-05-30 DIAGNOSIS — I4519 Other right bundle-branch block: Secondary | ICD-10-CM | POA: Diagnosis not present

## 2023-05-30 DIAGNOSIS — R9431 Abnormal electrocardiogram [ECG] [EKG]: Secondary | ICD-10-CM | POA: Diagnosis not present

## 2023-05-30 DIAGNOSIS — I472 Ventricular tachycardia, unspecified: Secondary | ICD-10-CM | POA: Insufficient documentation

## 2023-05-30 DIAGNOSIS — I428 Other cardiomyopathies: Secondary | ICD-10-CM | POA: Diagnosis not present

## 2023-05-30 LAB — CBC
HCT: 41.1 % (ref 39.0–52.0)
Hemoglobin: 12.8 g/dL — ABNORMAL LOW (ref 13.0–17.0)
MCH: 31.8 pg (ref 26.0–34.0)
MCHC: 31.1 g/dL (ref 30.0–36.0)
MCV: 102.2 fL — ABNORMAL HIGH (ref 80.0–100.0)
Platelets: 257 10*3/uL (ref 150–400)
RBC: 4.02 MIL/uL — ABNORMAL LOW (ref 4.22–5.81)
RDW: 13.5 % (ref 11.5–15.5)
WBC: 5.3 10*3/uL (ref 4.0–10.5)
nRBC: 0 % (ref 0.0–0.2)

## 2023-05-30 LAB — COMPREHENSIVE METABOLIC PANEL
ALT: 23 U/L (ref 0–44)
AST: 25 U/L (ref 15–41)
Albumin: 3.8 g/dL (ref 3.5–5.0)
Alkaline Phosphatase: 71 U/L (ref 38–126)
Anion gap: 7 (ref 5–15)
BUN: 12 mg/dL (ref 8–23)
CO2: 26 mmol/L (ref 22–32)
Calcium: 9 mg/dL (ref 8.9–10.3)
Chloride: 105 mmol/L (ref 98–111)
Creatinine, Ser: 1.31 mg/dL — ABNORMAL HIGH (ref 0.61–1.24)
GFR, Estimated: 57 mL/min — ABNORMAL LOW (ref >60)
Glucose, Bld: 90 mg/dL (ref 70–99)
Potassium: 4.3 mmol/L (ref 3.5–5.1)
Sodium: 138 mmol/L (ref 135–145)
Total Bilirubin: 0.6 mg/dL (ref <1.2)
Total Protein: 7.3 g/dL (ref 6.5–8.1)

## 2023-05-30 LAB — BRAIN NATRIURETIC PEPTIDE: B Natriuretic Peptide: 94.9 pg/mL (ref 0.0–100.0)

## 2023-05-30 LAB — TSH: TSH: 0.742 u[IU]/mL (ref 0.350–4.500)

## 2023-05-30 MED ORDER — AMIODARONE HCL 200 MG PO TABS: 100.0000 mg | ORAL_TABLET | Freq: Every day | ORAL | 5 refills | Status: DC

## 2023-05-30 NOTE — Addendum Note (Signed)
Encounter addended by: Suezanne Cheshire, RN on: 05/30/2023 9:58 AM  Actions taken: Clinical Note Signed

## 2023-05-30 NOTE — Addendum Note (Signed)
Encounter addended by: Suezanne Cheshire, RN on: 05/30/2023 9:55 AM  Actions taken: Pharmacy for encounter modified, Visit diagnoses modified, Order list changed, Diagnosis association updated, Charge Capture section accepted

## 2023-05-30 NOTE — Patient Instructions (Signed)
DECREASE Amiodarone to 100 mg daily  Labs done today, your results will be available in MyChart, we will contact you for abnormal readings.   You have been referred to Rheumatology that office will call  to schedule  Your physician recommends that you schedule a follow-up appointment in: 6 months(May 2025) call office in march to schedule an appointment   Your physician has requested that you have an echocardiogram. Echocardiography is a painless test that uses sound waves to create images of your heart. It provides your doctor with information about the size and shape of your heart and how well your heart's chambers and valves are working. This procedure takes approximately one hour. There are no restrictions for this procedure. Please do NOT wear cologne, perfume, aftershave, or lotions (deodorant is allowed). Please arrive 15 minutes prior to your appointment time.  Please note: We ask at that you not bring children with you during ultrasound (echo/ vascular) testing. Due to room size and safety concerns, children are not allowed in the ultrasound rooms during exams. Our front office staff cannot provide observation of children in our lobby area while testing is being conducted. An adult accompanying a patient to their appointment will only be allowed in the ultrasound room at the discretion of the ultrasound technician under special circumstances. We apologize for any inconvenience.  If you have any questions or concerns before your next appointment please send [place] a message through [place] or call our office at [phone].    TO LEAVE A MESSAGE FOR THE NURSE SELECT OPTION 2, PLEASE LEAVE A MESSAGE INCLUDING: YOUR NAME DATE OF BIRTH CALL BACK NUMBER REASON FOR CALL**this is important as we prioritize the call backs  YOU WILL RECEIVE A CALL BACK THE SAME DAY AS LONG AS YOU CALL BEFORE 4:00 PM At the [facility], you and your health needs are our priority. As part of our  continuing mission to provide you with exceptional heart care, we have created designated Provider Care Teams. These Care Teams include your primary Cardiologist (physician) and Advanced Practice Providers (APPs- Physician Assistants and Nurse Practitioners) who all work together to provide you with the care you need, when you need it.   You may see any of the following providers on your designated Care Team at your next follow up: Dr Arvilla Meres Dr Marca Ancona Dr. Dorthula Nettles Dr. Clearnce Hasten Amy Filbert Schilder, NP Robbie Lis, [place] [facility] [place], [place] Brynda Peon, NP [place] Lee, NP Karle Plumber, PharmD   Please be sure to bring in all your medications bottles to every appointment.    Thank you for choosing [facility]

## 2023-05-30 NOTE — Progress Notes (Signed)
ADVANCED HF CLINIC NOTE   PCP: Dr. Dana Allan HF Cardiologist: Dr. Gala Romney  HPI: 72 y.o. 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    He established with Afib clinic in March for atrial flutter. He underwent successful DCCV 04/17. Despite restoration of SR he noted recurrent palpitations, lightheadedness and chest discomfort at f/u 05/15. 14 day Zio placed. Seen in Cardiology clinic 12/03/21 with palpitations, recurrent near syncope and a syncopal episode. BP meds reduced. Read on 14 day Zio was expedited and showed sustained episodes of VT (longest 1 minute 30 seconds).    Patient was referred to ED for admission d/t recurrent VT on monitor. EP was consulted. Echo showed EF 35-40%, grade II DD, RV mildly reduced, RVSP 27 mmHg. Cardiac MRI: EF 33%, RVEF 40%, with multifocal areas of LGE with high intensity in basal septal LGE, elevated T2 values in lateral wall concerning for myocardial inflammation, concerning for sarcoidosis. LHC showed mild nonobstructive CAD. He was started on amiodarone and underwent ICD placement 12/10/21. Workup including PET for cardiac sarcoid recommended as outpatient.   NM cardiac amyloid tumor LOC: Visual and quantitative assessment (grade 1, H/CL equal 1.28) is equivocally suggestive of transthyretin amyloidosis.   Follow up 7/23 with Dr. Gala Romney. NYHA II, volume stable. Low dose spiro started.   Cardiac PET at [facility] (8/23) without evidence of cardiac sarcoidosis or inflammation .   Follow up 11/23, was doing well and had completed prednisone.   7/23 had repeat PET showing no evidence of sarcoid.   He presents today for routine f/u. 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 Feels good. No CP or SOB. No edema. Very compliant with meds. Remains on methotrexate 20mg  q Wednesday. Worried about decrease in appetite.  Weight has  gone up 4 pounds in last year,     Cardiac Studies: - Repeat Cardiac PET (post pred/MXT regimen) 7/24 no evidence of sarcoid.  - Cardiac PET at [facility] (8/23) EF 43% without evidence of cardiac sarcoidosis or inflammation.   - PYP (6/23): Visual and quantitative assessment (grade 1, H/CL equal 1.28) is equivocally suggestive of transthyretin amyloidosis.  - LHC (6/23): mild, non-obs CAD.    - cMRI (6/23): LVEF 33%, RVEF 40%, with multifocal areas of LGE with high intensity in basal septal LGE, elevated T2 values in lateral wall concerning for myocardial inflammation, concerning for sarcoidosis.   - Echo (6/23): EF 35-40%, grade II DD, RV mildly reduced, RVSP 27 mmHg.  - Zio 14 day (6/23): mostly NSR, multiple episodes of wide-complex tachycardia due to VT  Past Medical History:  Diagnosis Date   Allergy    Arthritis    Atrial flutter (HCC)    Carpal tunnel syndrome on left    Chest pain of uncertain etiology 10/24/2019   COVID-19    07/07/19   DDD (degenerative disc disease), thoracolumbar    multilevel   Degenerative joint disease of left shoulder    02/2011    Diverticulosis    left colon (2008)    ED (erectile dysfunction)    02/2011    GERD (gastroesophageal reflux disease)    H. pylori infection    Hyperlipidemia    Hypertension    Insomnia    Lactose intolerance    Low back pain    PVC (premature ventricular contraction) 10/24/2019   Shingles    07/2018   Smoking  smoking since age 87 y.o   Toe fracture, left    4th in 2014   Vitamin D deficiency    Current Outpatient Medications  Medication Sig Dispense Refill   amiodarone (PACERONE) 200 MG tablet TAKE 1 TABLET(200 MG) BY MOUTH DAILY 30 tablet 5   apixaban (ELIQUIS) 5 MG TABS tablet Take 1 tablet (5 mg total) by mouth 2 (two) times daily. 60 tablet 3   calcium carbonate (OS-CAL - DOSED IN MG OF ELEMENTAL CALCIUM) 1250 (500 Ca) MG tablet Take 1 tablet (500 mg of elemental calcium total) by mouth daily with  breakfast. 30 tablet 11   carvedilol (COREG) 6.25 MG tablet Take 1 tablet (6.25 mg total) by mouth 2 (two) times daily with a meal. 180 tablet 3   Cholecalciferol (VITAMIN D-3) 125 MCG (5000 UT) TABS Take 1 tablet by mouth daily. 30 tablet 11   ezetimibe (ZETIA) 10 MG tablet Take 1 tablet (10 mg total) by mouth daily. 90 tablet 3   FARXIGA 10 MG TABS tablet Take 1 tablet (10 mg total) by mouth daily. 90 tablet 3   ferrous sulfate 325 (65 FE) MG tablet Take 325 mg by mouth daily with breakfast.     folic acid (FOLVITE) 1 MG tablet TAKE 1 TABLET(1 MG) BY MOUTH DAILY 30 tablet 11   gabapentin (NEURONTIN) 300 MG capsule Take 600 mg by mouth at bedtime.     methotrexate (RHEUMATREX) 2.5 MG tablet TAKE 20MG (8 TABLETS) EVERY WEDNESDAY. PROTECT FROM LIGHT AS DIRECTED 150 tablet 0   Multiple Vitamin (MULTIVITAMIN) tablet Take 1 tablet by mouth daily.     mupirocin ointment (BACTROBAN) 2 % Apply 1 Application topically as needed.     pantoprazole (PROTONIX) 40 MG tablet Take 1 tablet (40 mg total) by mouth daily. 30 min before food 30 tablet 6   sacubitril-valsartan (ENTRESTO) 24-26 MG Take 1 tablet by mouth 2 (two) times daily. 180 tablet 3   spironolactone (ALDACTONE) 25 MG tablet TAKE 1 TABLET(25 MG) BY MOUTH DAILY 90 tablet 1   No current facility-administered medications for this encounter.   Allergies  Allergen Reactions   Crestor [Rosuvastatin Calcium] Other (See Comments)    Elevated muscle enzymes    Lipitor [Atorvastatin Calcium] Other (See Comments)    Elevated muscle enzymes    Social History   Socioeconomic History   Marital status: Married    Spouse name: Gwendolyn   Number of children: 2   Years of education: Not on file   Highest education level: High school graduate  Occupational History   Occupation: Retired    Comment: Semi  Tobacco Use   Smoking status: Former    Current packs/day: 0.00    Average packs/day: 0.5 packs/day for 49.0 years (24.5 ttl pk-yrs)    Types:  Cigarettes    Start date: 07/06/1971    Quit date: 06/26/2020    Years since quitting: 2.9   Smokeless tobacco: Never   Tobacco comments:    Former smoker 09/24/21  Vaping Use   Vaping status: Never Used  Substance and Sexual Activity   Alcohol use: No    Alcohol/week: 0.0 standard drinks of alcohol   Drug use: No   Sexual activity: Not on file  Other Topics Concern   Not on file  Social History Narrative   Married    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    On child    1 son, 1 daughter   Systems analyst op   Owns guns    Wears  seat belt, safe in relationship    Smoker    Retired 06/27/2019   Social Determinants of Health   Financial Resource Strain: Low Risk  (11/22/2022)   Overall Financial Resource Strain (CARDIA)    Difficulty of Paying Living Expenses: Not hard at all  Food Insecurity: No Food Insecurity (11/22/2022)   Hunger Vital Sign    Worried About Running Out of Food in the Last Year: Never true    Ran Out of Food in the Last Year: Never true  Transportation Needs: No Transportation Needs (11/22/2022)   PRAPARE - Administrator, Civil Service (Medical): No    Lack of Transportation (Non-Medical): No  Physical Activity: Sufficiently Active (11/22/2022)   Exercise Vital Sign    Days of Exercise per Week: 3 days    Minutes of Exercise per Session: 60 min  Stress: No Stress Concern Present (11/22/2022)   [employer] of Occupational Health - Occupational Stress Questionnaire    Feeling of Stress : Not at all  Social Connections: Socially Integrated (11/22/2022)   Social Connection and Isolation Panel [NHANES]    Frequency of Communication with Friends and Family: Once a week    Frequency of Social Gatherings with Friends and Family: More than three times a week    Attends Religious Services: More than 4 times per year    Active Member of [employer] or Organizations: Yes    Attends Banker Meetings: Never    Marital Status: Married  Catering manager Violence: Not At Risk  (11/22/2022)   Humiliation, Afraid, Rape, and Kick questionnaire    Fear of Current or Ex-Partner: No    Emotionally Abused: No    Physically Abused: No    Sexually Abused: No   Family History  Problem Relation Age of Onset   Heart disease Mother        age 77    Throat cancer Father 66   Cancer Father        throat    Cancer Maternal Uncle        uncle ?maternal or paternal died colon cancer in his 29s    Diabetes Maternal Aunt    Heart attack Maternal Grandmother   - His mother and grandmother had heart issues, uncertain if they had CHF.  BP 110/70   Pulse 61   Wt 110.9 kg (244 lb 9.6 oz)   SpO2 94%   BMI 26.21 kg/m   Wt Readings from Last 3 Encounters:  05/30/23 110.9 kg (244 lb 9.6 oz)  05/26/23 106.7 kg (235 lb 3.2 oz)  05/12/23 107.9 kg (237 lb 12.8 oz)   PHYSICAL EXAM: General:  Well appearing. No resp difficulty HEENT: normal Neck: supple. no JVD. Carotids 2+ bilat; no bruits. No lymphadenopathy or thryomegaly appreciated. Cor: PMI nondisplaced. Regular rate & rhythm. No rubs, gallops or murmurs. Lungs: clear Abdomen: soft, nontender, nondistended. No hepatosplenomegaly. No bruits or masses. Good bowel sounds. Extremities: no cyanosis, clubbing, rash, edema Neuro: alert & orientedx3, cranial nerves grossly intact. moves all 4 extremities w/o difficulty. Affect pleasant   ECG: pending  MDT ICD interrogation (personally reviewed): No VT/AF Activity level 2-3hr/day. Fluid ok  -   ASSESSMENT & PLAN: Chronic systolic CHF/NICM in setting of cardiac sarcoidosis - Echo (6/23): EF 35-40%, grade II DD, RV mildly reduced, RVSP 27 mmHg. - Cardiac MRI (6/23): EF 33%, RVEF 40%, with multifocal areas of LGE with high intensity in basal septal LGE, elevated T2 values in lateral wall concerning  for myocardial inflammation. Findings concerning for sarcoidosis.  - LHC (6/23): mild nonobstructive CAD. - Suspect etiology cardiac sarcoidosis (most likely) vs hereditary LMNA  cardiomyopathy. - Cardiac PET at [facility] (8/23) showed no evidence of cardiac sarcoidosis. EF 43% - Completed Prednisone 11/23  - Repeat PET 7/24 no evidence of sarcoid - Continue maintenance dose methotrexate - Stable NYHA II - Continue spironolactone 25 mg daily.  - Continue Coreg 6.25 mg bid. - Continue Entresto 24/26 mg bid. H/o low BP/orthostatics limits further titration  - Continue Farxiga 10 mg daily. - Continue Paramedicine support, appreciate their help. - ICD interrogated personally. No VT/AF Activity level 2-3hr/day. Fluid ok  - Refer to Dr. Dierdre Forth for long-term management of immunosuppression    2. VT/Hx syncope: - ICD interrogated personally. No VT/AF - Decrease amio to 100 daily - Check amio labs. Reminded to get annual eye exam    3. Atrial flutter: - Regular on exam today. No recent detection on device interrogation  - On amiodarone as above. Will decrease the dose  - Continue Eliquis 5 bid    4. CAD: - Mild on LHC 6/23. - no s/s angina - no ASA w/ a/c  - Continue Zetia. Has history of elevated LFTs with statins.  5. Loss of appetite  - check TFTs to rule out amio toxicity   Arvilla Meres, MD  9:30 AM

## 2023-05-30 NOTE — Progress Notes (Signed)
Paramedicine Encounter   Patient ID: [name] , male,   DOB: Nov 15, 1950,72 y.o.,  MRN: [id number]   I met with John Keith this morning during his appointment with Dr. Gala Romney. Pt reported continued decreased appetite. Dr. Gala Romney evaluated medication list and noted amio could have this effect. Dr. wanted to look at lab work associated with Amio, schedule Echo, EKG, and refer to rheumatology (Dr. Dierdre Forth). Dr. Gala Romney recommended decreasing amio. Pt was explained the reason for decrease and the the possible side effects to be aware of. Pt was advised to reach out to the clinic or myself if he experiences any of these symptoms. Pt felt comfortable making the medication change on his own and advised that he would reach out if he had any difficulty. Will follow up next Thursday for his home visit.   Weight @ clinic - 244.6 lbs  Weight @home  - 236.2 lbs  B/P -110/70 P - 62 SP02 - 94  Med changes- Decrease Amiodarone to 100 mg.  See if it benefits appetite and does not affect blood pressure or increase arrhythmias.   John Keith EMT-P Community Paramedic  [phone]

## 2023-05-31 LAB — T3: T3, Total: 101 ng/dL (ref 71–180)

## 2023-05-31 LAB — T4: T4, Total: 9.7 ug/dL (ref 4.5–12.0)

## 2023-06-01 ENCOUNTER — Telehealth (HOSPITAL_COMMUNITY): Payer: Self-pay

## 2023-06-01 NOTE — Telephone Encounter (Signed)
Referral sent to [facility] Rheumatology.Fax confirmation received

## 2023-06-09 ENCOUNTER — Other Ambulatory Visit (HOSPITAL_COMMUNITY): Payer: Self-pay | Admitting: Emergency Medicine

## 2023-06-09 NOTE — Progress Notes (Signed)
Paramedicine Encounter    Patient ID: John Keith, male    DOB: 1951/01/20, 72 y.o.   MRN: [id number]   Complaints - no complaints   Assessment - lung sounds clear, no edema noted   Compliance with meds -  no missed medications noted   Pill box filled - for 2x weeks   Refills needed - none  Meds changes since last visit - decreased amiodarone to 100mg     Social changes - increased appetite since decreasing Amio.    VISIT SUMMARY**  I met with John Keith in his home today. Pt reported that he has noticed an increase in his appetite since decreasing the amio. Pt denied any adverse effects and reported WNL blood pressures recorded daily. Pt denied any palpitations or chest pains, dizziness or shortness of breath. Pt was assessed to find no acute abnormalities. Pt's pill box was filled for 2x weeks. Will follow up in 2x weeks as scheduled. Upcoming appointments reviewed.   BP 122/80   Pulse 68   Wt 235 lb 12.8 oz (107 kg)   SpO2 95%   BMI 25.27 kg/m  Weight yesterday-234.8 lbs  Last visit weight-236 lbs    [name] Setting EMT-P Community Paramedic  [phone]    ACTION: Home visit completed     Patient Care Team: Dana Allan, MD as PCP - General (Family Medicine) Regan Lemming, MD as PCP - Electrophysiology (Cardiology) [name], Bevelyn Buckles, MD as PCP - Cardiology (Cardiology) Pincus Sanes, MD as Consulting Physician (Internal Medicine) Chilton Si, MD as Attending Physician (Cardiology) Regan Lemming, MD as Consulting Physician (Cardiology)  Patient Active Problem List   Diagnosis Date Noted   Dermatitis 01/31/2023   Onychomycosis 01/31/2023   Statin myopathy 11/15/2022   Abnormal glucose 11/15/2022   Prostate cancer screening 11/15/2022   Chronic systolic heart failure (HCC) 12/15/2021   Chronic combined systolic and diastolic CHF (congestive heart failure) (HCC) 12/15/2021   Nasal sore 12/15/2021   Syncope 12/07/2021    Ventricular tachycardia (HCC) 12/07/2021   Typical atrial flutter (HCC) 09/24/2021   Secondary hypercoagulable state (HCC) 09/24/2021   Abnormal x-ray of neck 06/16/2021   Abnormal MRI, lumbar spine 10/23/2020   Chronic low back pain 10/23/2020   Neuropathy 10/23/2020   Coronary artery disease of native artery of native heart with stable angina pectoris (HCC) 10/23/2020   Impingement syndrome of left shoulder region 08/22/2020   Pain in joint of left shoulder 07/24/2020   Chronic left shoulder pain 07/11/2020   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   Anemia 12/13/2019   Chest pain of uncertain etiology 10/24/2019   PVC (premature ventricular contraction) 10/24/2019   Gastric ulcer without hemorrhage or perforation 09/28/2019   COVID-19 virus detected 07/12/2019   Elevated troponin 07/07/2019   Elevated serum protein level 07/07/2019   Postherpetic neuralgia 08/17/2018   Benign prostatic hyperplasia 08/01/2018   Hip pain 06/29/2018   Aortic atherosclerosis (HCC) 09/22/2016   Fatigue 06/03/2016   Lower urinary tract symptoms (LUTS) 06/03/2016   Constipation 03/10/2016   Lumbar radiculopathy 02/20/2016   Encounter for immunization 11/26/2015   Insomnia 06/10/2015   External hemorrhoid 03/07/2015   Health care maintenance 01/09/2014   Carpal tunnel syndrome of left wrist 01/03/2013   Hyperlipidemia 11/09/2012   Vitamin D deficiency 11/09/2012   Essential hypertension 11/06/2012   Tobacco abuse 11/06/2012   Seasonal allergies 11/06/2012   Erectile dysfunction 11/06/2012   GERD (gastroesophageal reflux disease) 11/06/2012   DDD (degenerative disc disease), thoracolumbar 11/06/2012  Lactose intolerance 11/06/2012   Diverticulosis of colon without hemorrhage 11/06/2012   Diverticulosis of colon 11/06/2012   Degeneration of thoracolumbar intervertebral disc 11/06/2012   Gastroesophageal reflux disease 11/06/2012   Tobacco user 11/06/2012    Current Outpatient Medications:     amiodarone (PACERONE) 200 MG tablet, Take 0.5 tablets (100 mg total) by mouth daily., Disp: 30 tablet, Rfl: 5   apixaban (ELIQUIS) 5 MG TABS tablet, Take 1 tablet (5 mg total) by mouth 2 (two) times daily., Disp: 60 tablet, Rfl: 3   calcium carbonate (OS-CAL - DOSED IN MG OF ELEMENTAL CALCIUM) 1250 (500 Ca) MG tablet, Take 1 tablet (500 mg of elemental calcium total) by mouth daily with breakfast., Disp: 30 tablet, Rfl: 11   carvedilol (COREG) 6.25 MG tablet, Take 1 tablet (6.25 mg total) by mouth 2 (two) times daily with a meal., Disp: 180 tablet, Rfl: 3   Cholecalciferol (VITAMIN D-3) 125 MCG (5000 UT) TABS, Take 1 tablet by mouth daily., Disp: 30 tablet, Rfl: 11   ezetimibe (ZETIA) 10 MG tablet, Take 1 tablet (10 mg total) by mouth daily., Disp: 90 tablet, Rfl: 3   FARXIGA 10 MG TABS tablet, Take 1 tablet (10 mg total) by mouth daily., Disp: 90 tablet, Rfl: 3   ferrous sulfate 325 (65 FE) MG tablet, Take 325 mg by mouth daily with breakfast., Disp: , Rfl:    folic acid (FOLVITE) 1 MG tablet, TAKE 1 TABLET(1 MG) BY MOUTH DAILY, Disp: 30 tablet, Rfl: 11   gabapentin (NEURONTIN) 300 MG capsule, Take 600 mg by mouth at bedtime., Disp: , Rfl:    methotrexate (RHEUMATREX) 2.5 MG tablet, TAKE 20MG (8 TABLETS) EVERY WEDNESDAY. PROTECT FROM LIGHT AS DIRECTED, Disp: 150 tablet, Rfl: 0   Multiple Vitamin (MULTIVITAMIN) tablet, Take 1 tablet by mouth daily., Disp: , Rfl:    mupirocin ointment (BACTROBAN) 2 %, Apply 1 Application topically as needed., Disp: , Rfl:    pantoprazole (PROTONIX) 40 MG tablet, Take 1 tablet (40 mg total) by mouth daily. 30 min before food, Disp: 30 tablet, Rfl: 6   sacubitril-valsartan (ENTRESTO) 24-26 MG, Take 1 tablet by mouth 2 (two) times daily., Disp: 180 tablet, Rfl: 3   spironolactone (ALDACTONE) 25 MG tablet, TAKE 1 TABLET(25 MG) BY MOUTH DAILY, Disp: 90 tablet, Rfl: 1 Allergies  Allergen Reactions   Crestor [Rosuvastatin Calcium] Other (See Comments)    Elevated muscle  enzymes    Lipitor [Atorvastatin Calcium] Other (See Comments)    Elevated muscle enzymes      Social History   Socioeconomic History   Marital status: Married    Spouse name: Gwendolyn   Number of children: 2   Years of education: Not on file   Highest education level: High school graduate  Occupational History   Occupation: Retired    Comment: Semi  Tobacco Use   Smoking status: Former    Current packs/day: 0.00    Average packs/day: 0.5 packs/day for 49.0 years (24.5 ttl pk-yrs)    Types: Cigarettes    Start date: 07/06/1971    Quit date: 06/26/2020    Years since quitting: 2.9   Smokeless tobacco: Never   Tobacco comments:    Former smoker 09/24/21  Vaping Use   Vaping status: Never Used  Substance and Sexual Activity   Alcohol use: No    Alcohol/week: 0.0 standard drinks of alcohol   Drug use: No   Sexual activity: Not on file  Other Topics Concern   Not on file  Social History Narrative   Married    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    On child    1 son, 1 daughter   Systems analyst op   Owns guns    Wears seat belt, safe in relationship    Smoker    Retired 06/27/2019   Social Determinants of Health   Financial Resource Strain: Low Risk  (11/22/2022)   Overall Financial Resource Strain (CARDIA)    Difficulty of Paying Living Expenses: Not hard at all  Food Insecurity: No Food Insecurity (11/22/2022)   Hunger Vital Sign    Worried About Running Out of Food in the Last Year: Never true    Ran Out of Food in the Last Year: Never true  Transportation Needs: No Transportation Needs (11/22/2022)   PRAPARE - Administrator, Civil Service (Medical): No    Lack of Transportation (Non-Medical): No  Physical Activity: Sufficiently Active (11/22/2022)   Exercise Vital Sign    Days of Exercise per Week: 3 days    Minutes of Exercise per Session: 60 min  Stress: No Stress Concern Present (11/22/2022)   [employer] of Occupational Health - Occupational Stress Questionnaire     Feeling of Stress : Not at all  Social Connections: Socially Integrated (11/22/2022)   Social Connection and Isolation Panel [NHANES]    Frequency of Communication with Friends and Family: Once a week    Frequency of Social Gatherings with Friends and Family: More than three times a week    Attends Religious Services: More than 4 times per year    Active Member of [employer] or Organizations: Yes    Attends Banker Meetings: Never    Marital Status: Married  Catering manager Violence: Not At Risk (11/22/2022)   Humiliation, Afraid, Rape, and Kick questionnaire    Fear of Current or Ex-Partner: No    Emotionally Abused: No    Physically Abused: No    Sexually Abused: No    Physical Exam      Future Appointments  Date Time Provider Department Center  06/10/2023  7:00 AM [facility] DEVICE REMOTES [facility]  06/30/2023  8:00 AM [facility] ECHO OP 1 [facility]  08/04/2023  8:40 AM Dana Allan, MD [facility]  08/11/2023  8:15 AM McCaughan, Dia D, DPM [facility]  09/09/2023  7:00 AM [facility] DEVICE REMOTES [facility]  11/23/2023  8:30 AM [facility] ANNUAL WELLNESS VISIT [facility]  12/09/2023  7:00 AM [facility] DEVICE REMOTES [facility]  03/09/2024  7:00 AM [facility] DEVICE REMOTES [facility]  06/08/2024  7:00 AM [facility] DEVICE REMOTES [facility]

## 2023-06-10 ENCOUNTER — Ambulatory Visit: Payer: PPO

## 2023-06-10 DIAGNOSIS — I472 Ventricular tachycardia, unspecified: Secondary | ICD-10-CM

## 2023-06-10 LAB — CUP PACEART REMOTE DEVICE CHECK
Battery Remaining Longevity: 128 mo
Battery Voltage: 3.03 V
Brady Statistic RV Percent Paced: 0.1 %
Date Time Interrogation Session: 20241206002203
HighPow Impedance: 72 Ohm
Implantable Lead Connection Status: 753985
Implantable Lead Implant Date: 20230608
Implantable Lead Location: 753860
Implantable Pulse Generator Implant Date: 20230608
Lead Channel Impedance Value: 323 Ohm
Lead Channel Impedance Value: 380 Ohm
Lead Channel Pacing Threshold Amplitude: 0.875 V
Lead Channel Pacing Threshold Pulse Width: 0.4 ms
Lead Channel Sensing Intrinsic Amplitude: 10.375 mV
Lead Channel Sensing Intrinsic Amplitude: 10.375 mV
Lead Channel Setting Pacing Amplitude: 2 V
Lead Channel Setting Pacing Pulse Width: 0.4 ms
Lead Channel Setting Sensing Sensitivity: 0.3 mV
Zone Setting Status: 755011
Zone Setting Status: 755011

## 2023-06-18 DIAGNOSIS — Z79899 Other long term (current) drug therapy: Secondary | ICD-10-CM | POA: Diagnosis not present

## 2023-06-18 DIAGNOSIS — I10 Essential (primary) hypertension: Secondary | ICD-10-CM | POA: Diagnosis not present

## 2023-06-18 DIAGNOSIS — F17210 Nicotine dependence, cigarettes, uncomplicated: Secondary | ICD-10-CM | POA: Diagnosis not present

## 2023-06-18 DIAGNOSIS — R040 Epistaxis: Secondary | ICD-10-CM | POA: Diagnosis not present

## 2023-06-18 DIAGNOSIS — Z7901 Long term (current) use of anticoagulants: Secondary | ICD-10-CM | POA: Diagnosis not present

## 2023-06-18 DIAGNOSIS — I4892 Unspecified atrial flutter: Secondary | ICD-10-CM | POA: Diagnosis not present

## 2023-06-22 ENCOUNTER — Other Ambulatory Visit (HOSPITAL_COMMUNITY): Payer: Self-pay | Admitting: Internal Medicine

## 2023-06-22 ENCOUNTER — Other Ambulatory Visit (HOSPITAL_COMMUNITY): Payer: Self-pay | Admitting: Emergency Medicine

## 2023-06-22 NOTE — Progress Notes (Signed)
Paramedicine Encounter    Patient ID: John Keith, male    DOB: 1950/12/30, 72 y.o.   MRN: [id number]   Complaints - Saturday he had a nosebleed  Assessment - lung sound clear, no edema present   Compliance with meds - no missed doses  Pill box filled - for 2x weeks and 1 day   Refills needed - methotrexate, folic acid  Meds changes since last visit - none    Social changes - none   VISIT SUMMARY**  I met with John Keith in his home. Pt reported that he has had no CHF related complaints and advised that he has been feeling well. Pt did report an episode on Saturday where he had an uncontrollable nose bleed and had to go to the ED for sauntering. Pt advised no problems since discharge. Pt was advised to add a humidifier and OTC remedies to dry mucus membranes. Pt's pill box was filled for 2x weeks and 1x day to get back on our regular schedule. Prescriptions called in and pt notified. Upcoming appointments were reviewed. Will have [facility] follow up in 2x weeks.   BP 120/80   Pulse (!) 56   Resp 16   Wt 237 lb 3.2 oz (107.6 kg)   SpO2 96%   BMI 25.42 kg/m  Weight yesterday-237.8 lbs  Last visit weight-235 lbs    [name] EMT-P Community Paramedic  [phone]     ACTION: Home visit completed     Patient Care Team: Dana Allan, MD as PCP - General (Family Medicine) Regan Lemming, MD as PCP - Electrophysiology (Cardiology) [name], Bevelyn Buckles, MD as PCP - Cardiology (Cardiology) Pincus Sanes, MD as Consulting Physician (Internal Medicine) Chilton Si, MD as Attending Physician (Cardiology) Regan Lemming, MD as Consulting Physician (Cardiology)  Patient Active Problem List   Diagnosis Date Noted   Dermatitis 01/31/2023   Onychomycosis 01/31/2023   Statin myopathy 11/15/2022   Abnormal glucose 11/15/2022   Prostate cancer screening 11/15/2022   Chronic systolic heart failure (HCC) 12/15/2021   Chronic combined systolic and  diastolic CHF (congestive heart failure) (HCC) 12/15/2021   Nasal sore 12/15/2021   Syncope 12/07/2021   Ventricular tachycardia (HCC) 12/07/2021   Typical atrial flutter (HCC) 09/24/2021   Secondary hypercoagulable state (HCC) 09/24/2021   Abnormal x-ray of neck 06/16/2021   Abnormal MRI, lumbar spine 10/23/2020   Chronic low back pain 10/23/2020   Neuropathy 10/23/2020   Coronary artery disease of native artery of native heart with stable angina pectoris (HCC) 10/23/2020   Impingement syndrome of left shoulder region 08/22/2020   Pain in joint of left shoulder 07/24/2020   Chronic left shoulder pain 07/11/2020   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   Anemia 12/13/2019   Chest pain of uncertain etiology 10/24/2019   PVC (premature ventricular contraction) 10/24/2019   Gastric ulcer without hemorrhage or perforation 09/28/2019   COVID-19 virus detected 07/12/2019   Elevated troponin 07/07/2019   Elevated serum protein level 07/07/2019   Postherpetic neuralgia 08/17/2018   Benign prostatic hyperplasia 08/01/2018   Hip pain 06/29/2018   Aortic atherosclerosis (HCC) 09/22/2016   Fatigue 06/03/2016   Lower urinary tract symptoms (LUTS) 06/03/2016   Constipation 03/10/2016   Lumbar radiculopathy 02/20/2016   Encounter for immunization 11/26/2015   Insomnia 06/10/2015   External hemorrhoid 03/07/2015   Health care maintenance 01/09/2014   Carpal tunnel syndrome of left wrist 01/03/2013   Hyperlipidemia 11/09/2012   Vitamin D deficiency 11/09/2012   Essential hypertension  11/06/2012   Tobacco abuse 11/06/2012   Seasonal allergies 11/06/2012   Erectile dysfunction 11/06/2012   GERD (gastroesophageal reflux disease) 11/06/2012   DDD (degenerative disc disease), thoracolumbar 11/06/2012   Lactose intolerance 11/06/2012   Diverticulosis of colon without hemorrhage 11/06/2012   Diverticulosis of colon 11/06/2012   Degeneration of thoracolumbar intervertebral disc 11/06/2012    Gastroesophageal reflux disease 11/06/2012   Tobacco user 11/06/2012    Current Outpatient Medications:    amiodarone (PACERONE) 200 MG tablet, Take 0.5 tablets (100 mg total) by mouth daily., Disp: 30 tablet, Rfl: 5   apixaban (ELIQUIS) 5 MG TABS tablet, Take 1 tablet (5 mg total) by mouth 2 (two) times daily., Disp: 60 tablet, Rfl: 3   calcium carbonate (OS-CAL - DOSED IN MG OF ELEMENTAL CALCIUM) 1250 (500 Ca) MG tablet, Take 1 tablet (500 mg of elemental calcium total) by mouth daily with breakfast., Disp: 30 tablet, Rfl: 11   carvedilol (COREG) 6.25 MG tablet, Take 1 tablet (6.25 mg total) by mouth 2 (two) times daily with a meal., Disp: 180 tablet, Rfl: 3   Cholecalciferol (VITAMIN D-3) 125 MCG (5000 UT) TABS, Take 1 tablet by mouth daily., Disp: 30 tablet, Rfl: 11   ezetimibe (ZETIA) 10 MG tablet, Take 1 tablet (10 mg total) by mouth daily., Disp: 90 tablet, Rfl: 3   FARXIGA 10 MG TABS tablet, Take 1 tablet (10 mg total) by mouth daily., Disp: 90 tablet, Rfl: 3   ferrous sulfate 325 (65 FE) MG tablet, Take 325 mg by mouth daily with breakfast., Disp: , Rfl:    folic acid (FOLVITE) 1 MG tablet, TAKE 1 TABLET(1 MG) BY MOUTH DAILY, Disp: 30 tablet, Rfl: 11   gabapentin (NEURONTIN) 300 MG capsule, Take 600 mg by mouth at bedtime., Disp: , Rfl:    methotrexate (RHEUMATREX) 2.5 MG tablet, TAKE 20MG (8 TABLETS) EVERY WEDNESDAY. PROTECT FROM LIGHT AS DIRECTED, Disp: 150 tablet, Rfl: 0   Multiple Vitamin (MULTIVITAMIN) tablet, Take 1 tablet by mouth daily., Disp: , Rfl:    mupirocin ointment (BACTROBAN) 2 %, Apply 1 Application topically as needed., Disp: , Rfl:    pantoprazole (PROTONIX) 40 MG tablet, Take 1 tablet (40 mg total) by mouth daily. 30 min before food, Disp: 30 tablet, Rfl: 6   sacubitril-valsartan (ENTRESTO) 24-26 MG, Take 1 tablet by mouth 2 (two) times daily., Disp: 180 tablet, Rfl: 3   spironolactone (ALDACTONE) 25 MG tablet, TAKE 1 TABLET(25 MG) BY MOUTH DAILY, Disp: 90 tablet, Rfl:  1 Allergies  Allergen Reactions   Crestor [Rosuvastatin Calcium] Other (See Comments)    Elevated muscle enzymes    Lipitor [Atorvastatin Calcium] Other (See Comments)    Elevated muscle enzymes      Social History   Socioeconomic History   Marital status: Married    Spouse name: Gwendolyn   Number of children: 2   Years of education: Not on file   Highest education level: High school graduate  Occupational History   Occupation: Retired    Comment: Semi  Tobacco Use   Smoking status: Former    Current packs/day: 0.00    Average packs/day: 0.5 packs/day for 49.0 years (24.5 ttl pk-yrs)    Types: Cigarettes    Start date: 07/06/1971    Quit date: 06/26/2020    Years since quitting: 2.9   Smokeless tobacco: Never   Tobacco comments:    Former smoker 09/24/21  Vaping Use   Vaping status: Never Used  Substance and Sexual Activity   Alcohol  use: No    Alcohol/week: 0.0 standard drinks of alcohol   Drug use: No   Sexual activity: Not on file  Other Topics Concern   Not on file  Social History Narrative   Married    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    On child    1 son, 1 daughter   Machine op   Owns guns    Wears seat belt, safe in relationship    Smoker    Retired 06/27/2019   Social Drivers of Health   Financial Resource Strain: Low Risk  (11/22/2022)   Overall Financial Resource Strain (CARDIA)    Difficulty of Paying Living Expenses: Not hard at all  Food Insecurity: No Food Insecurity (11/22/2022)   Hunger Vital Sign    Worried About Running Out of Food in the Last Year: Never true    Ran Out of Food in the Last Year: Never true  Transportation Needs: No Transportation Needs (11/22/2022)   PRAPARE - Administrator, Civil Service (Medical): No    Lack of Transportation (Non-Medical): No  Physical Activity: Sufficiently Active (11/22/2022)   Exercise Vital Sign    Days of Exercise per Week: 3 days    Minutes of Exercise per Session: 60 min  Stress: No Stress  Concern Present (11/22/2022)   [employer] of Occupational Health - Occupational Stress Questionnaire    Feeling of Stress : Not at all  Social Connections: Socially Integrated (11/22/2022)   Social Connection and Isolation Panel [NHANES]    Frequency of Communication with Friends and Family: Once a week    Frequency of Social Gatherings with Friends and Family: More than three times a week    Attends Religious Services: More than 4 times per year    Active Member of [employer] or Organizations: Yes    Attends Banker Meetings: Never    Marital Status: Married  Catering manager Violence: Not At Risk (11/22/2022)   Humiliation, Afraid, Rape, and Kick questionnaire    Fear of Current or Ex-Partner: No    Emotionally Abused: No    Physically Abused: No    Sexually Abused: No    Physical Exam      Future Appointments  Date Time Provider Department Center  06/30/2023  8:00 AM [facility] ECHO OP 1 [facility]  08/04/2023  8:40 AM Dana Allan, MD [facility]  08/11/2023  8:15 AM McCaughan, Dia D, DPM [facility]  09/09/2023  7:00 AM [facility] DEVICE REMOTES [facility]  11/23/2023  8:30 AM [facility] ANNUAL WELLNESS VISIT [facility]  12/09/2023  7:00 AM [facility] DEVICE REMOTES [facility]  03/09/2024  7:00 AM [facility] DEVICE REMOTES [facility]  06/08/2024  7:00 AM [facility] DEVICE REMOTES [facility]

## 2023-06-23 DIAGNOSIS — Z79899 Other long term (current) drug therapy: Secondary | ICD-10-CM | POA: Diagnosis not present

## 2023-06-23 DIAGNOSIS — D8685 Sarcoid myocarditis: Secondary | ICD-10-CM | POA: Diagnosis not present

## 2023-06-23 DIAGNOSIS — M1991 Primary osteoarthritis, unspecified site: Secondary | ICD-10-CM | POA: Diagnosis not present

## 2023-06-23 DIAGNOSIS — Z6826 Body mass index (BMI) 26.0-26.9, adult: Secondary | ICD-10-CM | POA: Diagnosis not present

## 2023-06-23 DIAGNOSIS — M5136 Other intervertebral disc degeneration, lumbar region with discogenic back pain only: Secondary | ICD-10-CM | POA: Diagnosis not present

## 2023-06-23 DIAGNOSIS — E663 Overweight: Secondary | ICD-10-CM | POA: Diagnosis not present

## 2023-06-30 ENCOUNTER — Ambulatory Visit (HOSPITAL_COMMUNITY)
Admission: RE | Admit: 2023-06-30 | Discharge: 2023-06-30 | Disposition: A | Discharge status: Home / Self Care | Payer: PPO | Source: Ambulatory Visit | Attending: Family Medicine | Admitting: Family Medicine

## 2023-06-30 ENCOUNTER — Encounter: Payer: Self-pay | Admitting: *Deleted

## 2023-06-30 DIAGNOSIS — Z006 Encounter for examination for normal comparison and control in clinical research program: Secondary | ICD-10-CM

## 2023-06-30 DIAGNOSIS — I509 Heart failure, unspecified: Secondary | ICD-10-CM | POA: Insufficient documentation

## 2023-06-30 DIAGNOSIS — E785 Hyperlipidemia, unspecified: Secondary | ICD-10-CM | POA: Insufficient documentation

## 2023-06-30 DIAGNOSIS — I110 Hypertensive heart disease with heart failure: Secondary | ICD-10-CM | POA: Diagnosis not present

## 2023-06-30 DIAGNOSIS — I5022 Chronic systolic (congestive) heart failure: Secondary | ICD-10-CM

## 2023-06-30 LAB — ECHOCARDIOGRAM COMPLETE
AR max vel: 2.24 cm2
AV Area VTI: 2.29 cm2
AV Area mean vel: 2.12 cm2
AV Mean grad: 4.5 mm[Hg]
AV Peak grad: 8.9 mm[Hg]
Ao pk vel: 1.5 m/s
Area-P 1/2: 2.71 cm2
Calc EF: 57.3 %
S' Lateral: 3.8 cm
Single Plane A2C EF: 60.9 %
Single Plane A4C EF: 54 %

## 2023-07-01 NOTE — Research (Signed)
SITE: [id number]     Subject #[id number]    Subprotocol: A  Inclusion Criteria  Patients who meet all of the following criteria are eligible for enrollment as study participants:  Yes No  Age > 73 years old X   Eligible to wear Holter Study X    Exclusion Criteria  Patients who meet any of these criteria are not eligible for enrollment as study participants: Yes No  1. Receiving any mechanical (respiratory or circulatory) or renal support therapy at Screening or during Visit #1.  X  2.  Any other conditions that in the opinion of the investigators are likely to prevent compliance with the study protocol or pose a safety concern if the subject participates in the study.  X  3. Poor tolerance, namely susceptible to severe skin allergies from ECG adhesive patch application.  X   Protocol: REV H                                     Residential Zip code*  [id number] (First 3 digits ONLY)                                             [facility] Informed Consent   Subject Name: John Keith  Subject met inclusion and exclusion criteria.  The informed consent form, study requirements and expectations were reviewed with the subject. Subject had opportunity to read consent and questions and concerns were addressed prior to the signing of the consent form.  The subject verbalized understanding of the trial requirements.  The subject agreed to participate in the [facility] EF ACT trial and signed the informed consent at 0855 on 06/30/2023.  The informed consent was obtained prior to performance of any protocol-specific procedures for the subject.  A copy of the signed informed consent was given to the subject and a copy was placed in the subject's medical record.   [facility]          Current Outpatient Medications:    amiodarone (PACERONE) 200 MG tablet, Take 0.5 tablets (100 mg total) by mouth daily., Disp: 30 tablet, Rfl: 5   apixaban (ELIQUIS) 5 MG TABS tablet, Take 1 tablet (5 mg total) by mouth 2  (two) times daily., Disp: 60 tablet, Rfl: 3   calcium carbonate (OS-CAL - DOSED IN MG OF ELEMENTAL CALCIUM) 1250 (500 Ca) MG tablet, Take 1 tablet (500 mg of elemental calcium total) by mouth daily with breakfast., Disp: 30 tablet, Rfl: 11   carvedilol (COREG) 6.25 MG tablet, Take 1 tablet (6.25 mg total) by mouth 2 (two) times daily with a meal., Disp: 180 tablet, Rfl: 3   Cholecalciferol (VITAMIN D-3) 125 MCG (5000 UT) TABS, Take 1 tablet by mouth daily., Disp: 30 tablet, Rfl: 11   ezetimibe (ZETIA) 10 MG tablet, Take 1 tablet (10 mg total) by mouth daily., Disp: 90 tablet, Rfl: 3   FARXIGA 10 MG TABS tablet, Take 1 tablet (10 mg total) by mouth daily., Disp: 90 tablet, Rfl: 3   ferrous sulfate 325 (65 FE) MG tablet, Take 325 mg by mouth daily with breakfast., Disp: , Rfl:    folic acid (FOLVITE) 1 MG tablet, TAKE 1 TABLET(1 MG) BY MOUTH DAILY, Disp: 30 tablet, Rfl: 11   gabapentin (NEURONTIN) 300 MG capsule, Take  600 mg by mouth at bedtime., Disp: , Rfl:    methotrexate (RHEUMATREX) 2.5 MG tablet, TAKE 20MG (8 TABLETS) EVERY WEDNESDAY. PROTECT FROM LIGHT AS DIRECTED, Disp: 150 tablet, Rfl: 0   Multiple Vitamin (MULTIVITAMIN) tablet, Take 1 tablet by mouth daily., Disp: , Rfl:    mupirocin ointment (BACTROBAN) 2 %, Apply 1 Application topically as needed., Disp: , Rfl:    pantoprazole (PROTONIX) 40 MG tablet, Take 1 tablet (40 mg total) by mouth daily. 30 min before food, Disp: 30 tablet, Rfl: 6   sacubitril-valsartan (ENTRESTO) 24-26 MG, Take 1 tablet by mouth 2 (two) times daily., Disp: 180 tablet, Rfl: 3   spironolactone (ALDACTONE) 25 MG tablet, TAKE 1 TABLET(25 MG) BY MOUTH DAILY, Disp: 90 tablet, Rfl: 1

## 2023-07-07 ENCOUNTER — Other Ambulatory Visit (HOSPITAL_COMMUNITY): Payer: Self-pay

## 2023-07-07 NOTE — Progress Notes (Signed)
 Paramedicine Encounter    Patient ID: John Keith, male    DOB: Dec 09, 1950, 73 y.o.   MRN: [id number]   Complaints- none   Assessment- CAOX4, warm and dry   Compliance with meds- no missed doses of medication over the last two weeks  Pill box filled- for two weeks   Refills needed- none   Meds changes since last visit- none     Social changes- none    VISIT SUMMARY**  Met with John Keith in the home today where he reports feeling good with no complaints. He denied any recent chest pain, shortness of breath, dizziness or swelling. He denied any missed doses. None noted in his pill box. Meds reviewed and pill box filled for two weeks. No refills needed at present. I obtained assessment and vitals as noted. No lower leg swelling noted. Lungs clear. We reviewed upcoming appointments and confirmed same. I will see John Keith in two weeks. Visit complete.   There were no vitals taken for this visit. Weight yesterday-- 235lbs Last visit weight-- 237lbs     ACTION: Home visit completed     Patient Care Team: [name], MD as PCP - General (Family Medicine) [name], MD as PCP - Electrophysiology (Cardiology) [name], [name], MD as PCP - Cardiology (Cardiology) [name], MD as Consulting Physician (Internal Medicine) [name], MD as Attending Physician (Cardiology) [name], MD as Consulting Physician (Cardiology)  Patient Active Problem List   Diagnosis Date Noted   Dermatitis 01/31/2023   Onychomycosis 01/31/2023   Statin myopathy 11/15/2022   Abnormal glucose 11/15/2022   Prostate cancer screening 11/15/2022   Chronic systolic heart failure (HCC) 12/15/2021   Chronic combined systolic and diastolic CHF (congestive heart failure) (HCC) 12/15/2021   Nasal sore 12/15/2021   Syncope 12/07/2021   Ventricular tachycardia (HCC) 12/07/2021   Typical atrial flutter (HCC) 09/24/2021   Secondary hypercoagulable state (HCC) 09/24/2021    Abnormal x-ray of neck 06/16/2021   Abnormal MRI, lumbar spine 10/23/2020   Chronic low back pain 10/23/2020   Neuropathy 10/23/2020   Coronary artery disease of native artery of native heart with stable angina pectoris (HCC) 10/23/2020   Impingement syndrome of left shoulder region 08/22/2020   Pain in joint of left shoulder 07/24/2020   Chronic left shoulder pain 07/11/2020   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   Anemia 12/13/2019   Chest pain of uncertain etiology 10/24/2019   PVC (premature ventricular contraction) 10/24/2019   Gastric ulcer without hemorrhage or perforation 09/28/2019   COVID-19 virus detected 07/12/2019   Elevated troponin 07/07/2019   Elevated serum protein level 07/07/2019   Postherpetic neuralgia 08/17/2018   Benign prostatic hyperplasia 08/01/2018   Hip pain 06/29/2018   Aortic atherosclerosis (HCC) 09/22/2016   Fatigue 06/03/2016   Lower urinary tract symptoms (LUTS) 06/03/2016   Constipation 03/10/2016   Lumbar radiculopathy 02/20/2016   Encounter for immunization 11/26/2015   Insomnia 06/10/2015   External hemorrhoid 03/07/2015   Health care maintenance 01/09/2014   Carpal tunnel syndrome of left wrist 01/03/2013   Hyperlipidemia 11/09/2012   Vitamin D  deficiency 11/09/2012   Essential hypertension 11/06/2012   Tobacco abuse 11/06/2012   Seasonal allergies 11/06/2012   Erectile dysfunction 11/06/2012   GERD (gastroesophageal reflux disease) 11/06/2012   DDD (degenerative disc disease), thoracolumbar 11/06/2012   Lactose intolerance 11/06/2012   Diverticulosis of colon without hemorrhage 11/06/2012   Diverticulosis of colon 11/06/2012   Degeneration of thoracolumbar intervertebral disc 11/06/2012   Gastroesophageal reflux disease 11/06/2012  Tobacco user 11/06/2012    Current Outpatient Medications:    amiodarone  (PACERONE ) 200 MG tablet, Take 0.5 tablets (100 mg total) by mouth daily., Disp: 30 tablet, Rfl: 5   apixaban  (ELIQUIS ) 5 MG TABS  tablet, Take 1 tablet (5 mg total) by mouth 2 (two) times daily., Disp: 60 tablet, Rfl: 3   calcium  carbonate (OS-CAL - DOSED IN MG OF ELEMENTAL CALCIUM ) 1250 (500 Ca) MG tablet, Take 1 tablet (500 mg of elemental calcium  total) by mouth daily with breakfast., Disp: 30 tablet, Rfl: 11   carvedilol  (COREG ) 6.25 MG tablet, Take 1 tablet (6.25 mg total) by mouth 2 (two) times daily with a meal., Disp: 180 tablet, Rfl: 3   Cholecalciferol  (VITAMIN D -3) 125 MCG (5000 UT) TABS, Take 1 tablet by mouth daily., Disp: 30 tablet, Rfl: 11   ezetimibe  (ZETIA ) 10 MG tablet, Take 1 tablet (10 mg total) by mouth daily., Disp: 90 tablet, Rfl: 3   FARXIGA  10 MG TABS tablet, Take 1 tablet (10 mg total) by mouth daily., Disp: 90 tablet, Rfl: 3   ferrous sulfate  325 (65 FE) MG tablet, Take 325 mg by mouth daily with breakfast., Disp: , Rfl:    folic acid  (FOLVITE ) 1 MG tablet, TAKE 1 TABLET(1 MG) BY MOUTH DAILY, Disp: 30 tablet, Rfl: 11   gabapentin  (NEURONTIN ) 300 MG capsule, Take 600 mg by mouth at bedtime., Disp: , Rfl:    methotrexate  (RHEUMATREX) 2.5 MG tablet, TAKE 20MG (8 TABLETS) EVERY WEDNESDAY. PROTECT FROM LIGHT AS DIRECTED, Disp: 150 tablet, Rfl: 0   Multiple Vitamin (MULTIVITAMIN) tablet, Take 1 tablet by mouth daily., Disp: , Rfl:    mupirocin  ointment (BACTROBAN ) 2 %, Apply 1 Application topically as needed., Disp: , Rfl:    pantoprazole  (PROTONIX ) 40 MG tablet, Take 1 tablet (40 mg total) by mouth daily. 30 min before food, Disp: 30 tablet, Rfl: 6   sacubitril -valsartan  (ENTRESTO ) 24-26 MG, Take 1 tablet by mouth 2 (two) times daily., Disp: 180 tablet, Rfl: 3   spironolactone  (ALDACTONE ) 25 MG tablet, TAKE 1 TABLET(25 MG) BY MOUTH DAILY, Disp: 90 tablet, Rfl: 1 Allergies  Allergen Reactions   Crestor  [Rosuvastatin  Calcium ] Other (See Comments)    Elevated muscle enzymes    Lipitor [Atorvastatin  Calcium ] Other (See Comments)    Elevated muscle enzymes      Social History   Socioeconomic History    Marital status: Married    Spouse name: Proofreader   Number of children: 2   Years of education: Not on file   Highest education level: High school graduate  Occupational History   Occupation: Retired    Comment: Semi  Tobacco Use   Smoking status: Former    Current packs/day: 0.00    Average packs/day: 0.5 packs/day for 49.0 years (24.5 ttl pk-yrs)    Types: Cigarettes    Start date: 07/06/1971    Quit date: 06/26/2020    Years since quitting: 3.0   Smokeless tobacco: Never   Tobacco comments:    Former smoker 09/24/21  Vaping Use   Vaping status: Never Used  Substance and Sexual Activity   Alcohol use: No    Alcohol/week: 0.0 standard drinks of alcohol   Drug use: No   Sexual activity: Not on file  Other Topics Concern   Not on file  Social History Narrative   Married    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    On child    1 son, 1 daughter   Systems Analyst op   Owns guns  Wears seat belt, safe in relationship    Smoker    Retired 06/27/2019   Social Drivers of [employer] Strain: Low Risk  (11/22/2022)   Overall Financial Resource Strain (CARDIA)    Difficulty of Paying Living Expenses: Not hard at all  Food Insecurity: No Food Insecurity (11/22/2022)   Hunger Vital Sign    Worried About Running Out of Food in the Last Year: Never true    Ran Out of Food in the Last Year: Never true  Transportation Needs: No Transportation Needs (11/22/2022)   PRAPARE - Administrator, Civil Service (Medical): No    Lack of Transportation (Non-Medical): No  Physical Activity: Sufficiently Active (11/22/2022)   Exercise Vital Sign    Days of Exercise per Week: 3 days    Minutes of Exercise per Session: 60 min  Stress: No Stress Concern Present (11/22/2022)   [employer] of Occupational Health - Occupational Stress Questionnaire    Feeling of Stress : Not at all  Social Connections: Socially Integrated (11/22/2022)   Social Connection and Isolation Panel [NHANES]     Frequency of Communication with Friends and Family: Once a week    Frequency of Social Gatherings with Friends and Family: More than three times a week    Attends Religious Services: More than 4 times per year    Active Member of [employer] or Organizations: Yes    Attends Banker Meetings: Never    Marital Status: Married  Catering Manager Violence: Not At Risk (11/22/2022)   Humiliation, Afraid, Rape, and Kick questionnaire    Fear of Current or Ex-Partner: No    Emotionally Abused: No    Physically Abused: No    Sexually Abused: No    Physical Exam      Future Appointments  Date Time Provider Department Center  08/04/2023  8:40 AM [name], MD [facility]  08/11/2023  8:15 AM McCaughan, Dia D, DPM [facility]  09/09/2023  7:00 AM [facility] DEVICE REMOTES [facility]  11/23/2023  8:30 AM [facility] ANNUAL WELLNESS VISIT [facility]  12/09/2023  7:00 AM [facility] DEVICE REMOTES [facility]  03/09/2024  7:00 AM [facility] DEVICE REMOTES [facility]  06/08/2024  7:00 AM [facility] DEVICE REMOTES [facility]

## 2023-07-13 ENCOUNTER — Ambulatory Visit: Payer: Self-pay | Admitting: Family Medicine

## 2023-07-13 DIAGNOSIS — I519 Heart disease, unspecified: Secondary | ICD-10-CM | POA: Diagnosis not present

## 2023-07-13 DIAGNOSIS — J019 Acute sinusitis, unspecified: Secondary | ICD-10-CM | POA: Diagnosis not present

## 2023-07-13 NOTE — Telephone Encounter (Signed)
   Chief Complaint: swollen eye Symptoms: watery discharge, light sensitivity  Frequency: constant  Disposition: [] ED /[x] Urgent Care (no appt availability in office) / [] Appointment(In office/virtual)/ []  [facility]/ [] Home Care/ [] Refused Recommended Disposition /[] [facility]/ []  Follow-up with PCP Additional Notes: Pt complaining or swollen right eye. Pt states he has sinus cold for a week now and last night my eye turned red and became swollen. Pt stated he kept wiping his eye-watery discharge. Pt stated he has headache too. Per protocol, pt needs to be seen today. No appt available. RN advised pt to go to urgent care today. Pt verbalized he would. RN gave care advice and pt verbalized understanding.         Copied from CRM [id number]. Topic: Clinical - Red Word Triage >> Jul 13, 2023 10:17 AM Robinson H wrote: [facility] that prompted transfer to Nurse Triage: Right eye is swollen and red, and has a thumping headache. Pain right above eye. Reason for Disposition  [1] MILD eyelid swelling (puffiness) AND [2] sinus pain or pressure  Answer Assessment - Initial Assessment Questions 1. ONSET: When did the swelling start? (e.g., minutes, hours, days)     yesterday 2. LOCATION: What part of the eyelids is swollen?     Right eye 3. SEVERITY: How swollen is it?     Barely swollen 4. ITCHING: Is there any itching? If Yes, ask: How much?   (Scale 1-10; mild, moderate or severe)     Yes, 2 5. PAIN: Is the swelling painful to touch? If Yes, ask: How painful is it?   (Scale 1-10; mild, moderate or severe)    just Sore 6. FEVER: Do you have a fever? If Yes, ask: What is it, how was it measured, and when did it start?      Not sure 7. CAUSE: What do you think is causing the swelling?     Sinus cold from last week 8. RECURRENT SYMPTOM: Have you had eyelid swelling before? If Yes, ask: When was the last time? What happened that time?      no 9. OTHER SYMPTOMS: Do you have any other symptoms? (e.g., blurred vision, eye discharge, rash, runny nose)     Eye red, discharge (watery, runny nose, light sensitivity  Protocols used: Eye - Swelling-A-AH

## 2023-07-13 NOTE — Telephone Encounter (Signed)
 Called pt and he stated that he has to take his wife to the pain clinic, but when he leaves he will go to the UC in [place].

## 2023-07-21 ENCOUNTER — Other Ambulatory Visit (HOSPITAL_COMMUNITY): Payer: Self-pay

## 2023-07-21 NOTE — Progress Notes (Signed)
Paramedicine Encounter    Patient ID: John Keith, male    DOB: [birth year], 73 y.o.   MRN: [id number]   Complaints- NONE   Assessment- CAOx4,warm and dry, ambulatory with no shortness of breath, lungs clear, no abdominal distention and lower legs clear of edema.   Compliance with meds- NO missed doses   Pill box filled- two weeks   Refills needed-  -spironolactone -carvedilol -gabapentin -folic acid -farxiga -eliquis   Meds changes since last visit- NONE     Social changes- Eliquis from [facility] ran out- called [facility] where they confirmed copay is now $47/ mo. [place] agreed to pay same and we will re apply once OOP is met.    VISIT SUMMARY**  Arrived for home visit for John Keith who reports to be feeling well with no complaints. He denied any recent chest pain, dizziness, shortness of breath or swelling. His weight gradually increased over the last two weeks but no abrupt increases over night and no symptoms. He admits his appetite has improved since decrease in amiodarone in November. I obtained vitals and assessment. I reviewed meds and confirmed same filling two weeks of pill boxes. Refills called into [facility]. Eliquis info as noted above in social changes note. He reports he was seen at [facility] on the 8th for sinus issues with right eye swelling- he got two injections and PO antibiotics and they have improved symptoms and he has no current complaints. We reviewed upcoming appointments. I will see John Keith in two weeks in the afternoon.   Resp 16   Wt 239 lb (108.4 kg)   BMI 25.61 kg/m  Weight yesterday-- 238lbs Last visit weight-- 234lbs     ACTION: Home visit completed     Patient Care Team: Dana Allan, MD as PCP - General (Family Medicine) Regan Lemming, MD as PCP - Electrophysiology (Cardiology) [name], Bevelyn Buckles, MD as PCP - Cardiology (Cardiology) Pincus Sanes, MD as Consulting Physician (Internal Medicine) Chilton Si, MD as Attending Physician  (Cardiology) Regan Lemming, MD as Consulting Physician (Cardiology)  Patient Active Problem List   Diagnosis Date Noted   Dermatitis 01/31/2023   Onychomycosis 01/31/2023   Statin myopathy 11/15/2022   Abnormal glucose 11/15/2022   Prostate cancer screening 11/15/2022   Chronic systolic heart failure (HCC) 12/15/2021   Chronic combined systolic and diastolic CHF (congestive heart failure) (HCC) 12/15/2021   Nasal sore 12/15/2021   Syncope 12/07/2021   Ventricular tachycardia (HCC) 12/07/2021   Typical atrial flutter (HCC) 09/24/2021   Secondary hypercoagulable state (HCC) 09/24/2021   Abnormal x-ray of neck 06/16/2021   Abnormal MRI, lumbar spine 10/23/2020   Chronic low back pain 10/23/2020   Neuropathy 10/23/2020   Coronary artery disease of native artery of native heart with stable angina pectoris (HCC) 10/23/2020   Impingement syndrome of left shoulder region 08/22/2020   Pain in joint of left shoulder 07/24/2020   Chronic left shoulder pain 07/11/2020   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   Anemia 12/13/2019   Chest pain of uncertain etiology 10/24/2019   PVC (premature ventricular contraction) 10/24/2019   Gastric ulcer without hemorrhage or perforation 09/28/2019   COVID-19 virus detected 07/12/2019   Elevated troponin 07/07/2019   Elevated serum protein level 07/07/2019   Postherpetic neuralgia 08/17/2018   Benign prostatic hyperplasia 08/01/2018   Hip pain 06/29/2018   Aortic atherosclerosis (HCC) 09/22/2016   Fatigue 06/03/2016   Lower urinary tract symptoms (LUTS) 06/03/2016   Constipation 03/10/2016   Lumbar radiculopathy 02/20/2016   Encounter  for immunization 11/26/2015   Insomnia 06/10/2015   External hemorrhoid 03/07/2015   Health care maintenance 01/09/2014   Carpal tunnel syndrome of left wrist 01/03/2013   Hyperlipidemia 11/09/2012   Vitamin D deficiency 11/09/2012   Essential hypertension 11/06/2012   Tobacco abuse 11/06/2012   Seasonal  allergies 11/06/2012   Erectile dysfunction 11/06/2012   GERD (gastroesophageal reflux disease) 11/06/2012   DDD (degenerative disc disease), thoracolumbar 11/06/2012   Lactose intolerance 11/06/2012   Diverticulosis of colon without hemorrhage 11/06/2012   Diverticulosis of colon 11/06/2012   Degeneration of thoracolumbar intervertebral disc 11/06/2012   Gastroesophageal reflux disease 11/06/2012   Tobacco user 11/06/2012    Current Outpatient Medications:    amiodarone (PACERONE) 200 MG tablet, Take 0.5 tablets (100 mg total) by mouth daily., Disp: 30 tablet, Rfl: 5   apixaban (ELIQUIS) 5 MG TABS tablet, Take 1 tablet (5 mg total) by mouth 2 (two) times daily., Disp: 60 tablet, Rfl: 3   calcium carbonate (OS-CAL - DOSED IN MG OF ELEMENTAL CALCIUM) 1250 (500 Ca) MG tablet, Take 1 tablet (500 mg of elemental calcium total) by mouth daily with breakfast., Disp: 30 tablet, Rfl: 11   carvedilol (COREG) 6.25 MG tablet, Take 1 tablet (6.25 mg total) by mouth 2 (two) times daily with a meal., Disp: 180 tablet, Rfl: 3   Cholecalciferol (VITAMIN D-3) 125 MCG (5000 UT) TABS, Take 1 tablet by mouth daily., Disp: 30 tablet, Rfl: 11   ezetimibe (ZETIA) 10 MG tablet, Take 1 tablet (10 mg total) by mouth daily., Disp: 90 tablet, Rfl: 3   FARXIGA 10 MG TABS tablet, Take 1 tablet (10 mg total) by mouth daily., Disp: 90 tablet, Rfl: 3   ferrous sulfate 325 (65 FE) MG tablet, Take 325 mg by mouth daily with breakfast., Disp: , Rfl:    folic acid (FOLVITE) 1 MG tablet, TAKE 1 TABLET(1 MG) BY MOUTH DAILY, Disp: 30 tablet, Rfl: 11   gabapentin (NEURONTIN) 300 MG capsule, Take 600 mg by mouth at bedtime., Disp: , Rfl:    methotrexate (RHEUMATREX) 2.5 MG tablet, TAKE 20MG (8 TABLETS) EVERY WEDNESDAY. PROTECT FROM LIGHT AS DIRECTED, Disp: 150 tablet, Rfl: 0   Multiple Vitamin (MULTIVITAMIN) tablet, Take 1 tablet by mouth daily., Disp: , Rfl:    mupirocin ointment (BACTROBAN) 2 %, Apply 1 Application topically as  needed., Disp: , Rfl:    pantoprazole (PROTONIX) 40 MG tablet, Take 1 tablet (40 mg total) by mouth daily. 30 min before food, Disp: 30 tablet, Rfl: 6   sacubitril-valsartan (ENTRESTO) 24-26 MG, Take 1 tablet by mouth 2 (two) times daily., Disp: 180 tablet, Rfl: 3   spironolactone (ALDACTONE) 25 MG tablet, TAKE 1 TABLET(25 MG) BY MOUTH DAILY, Disp: 90 tablet, Rfl: 1 Allergies  Allergen Reactions   Crestor [Rosuvastatin Calcium] Other (See Comments)    Elevated muscle enzymes    Lipitor [Atorvastatin Calcium] Other (See Comments)    Elevated muscle enzymes      Social History   Socioeconomic History   Marital status: Married    Spouse name: Gwendolyn   Number of children: 2   Years of education: Not on file   Highest education level: High school graduate  Occupational History   Occupation: Retired    Comment: Semi  Tobacco Use   Smoking status: Former    Current packs/day: 0.00    Average packs/day: 0.5 packs/day for 49.0 years (24.5 ttl pk-yrs)    Types: Cigarettes    Start date: 07/06/1971    Quit  date: 06/26/2020    Years since quitting: 3.0   Smokeless tobacco: Never   Tobacco comments:    Former smoker 09/24/21  Vaping Use   Vaping status: Never Used  Substance and Sexual Activity   Alcohol use: No    Alcohol/week: 0.0 standard drinks of alcohol   Drug use: No   Sexual activity: Not on file  Other Topics Concern   Not on file  Social History Narrative   Married    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    On child    1 son, 1 daughter   Machine op   Owns guns    Wears seat belt, safe in relationship    Smoker    Retired 06/27/2019   Social Drivers of Health   Financial Resource Strain: Low Risk  (11/22/2022)   Overall Financial Resource Strain (CARDIA)    Difficulty of Paying Living Expenses: Not hard at all  Food Insecurity: No Food Insecurity (11/22/2022)   Hunger Vital Sign    Worried About Running Out of Food in the Last Year: Never true    Ran Out of Food in the Last  Year: Never true  Transportation Needs: No Transportation Needs (11/22/2022)   PRAPARE - Administrator, Civil Service (Medical): No    Lack of Transportation (Non-Medical): No  Physical Activity: Sufficiently Active (11/22/2022)   Exercise Vital Sign    Days of Exercise per Week: 3 days    Minutes of Exercise per Session: 60 min  Stress: No Stress Concern Present (11/22/2022)   [employer] of Occupational Health - Occupational Stress Questionnaire    Feeling of Stress : Not at all  Social Connections: Socially Integrated (11/22/2022)   Social Connection and Isolation Panel [NHANES]    Frequency of Communication with Friends and Family: Once a week    Frequency of Social Gatherings with Friends and Family: More than three times a week    Attends Religious Services: More than 4 times per year    Active Member of [employer] or Organizations: Yes    Attends Banker Meetings: Never    Marital Status: Married  Catering manager Violence: Not At Risk (11/22/2022)   Humiliation, Afraid, Rape, and Kick questionnaire    Fear of Current or Ex-Partner: No    Emotionally Abused: No    Physically Abused: No    Sexually Abused: No    Physical Exam      Future Appointments  Date Time Provider Department Center  08/04/2023  8:40 AM Dana Allan, MD [facility]  08/11/2023  9:45 AM McCaughan, Dia D, DPM [facility]  09/09/2023  7:00 AM [facility] DEVICE REMOTES [facility]  11/23/2023  8:30 AM [facility] ANNUAL WELLNESS VISIT [facility]  12/09/2023  7:00 AM [facility] DEVICE REMOTES [facility]  03/09/2024  7:00 AM [facility] DEVICE REMOTES [facility]  06/08/2024  7:00 AM [facility] DEVICE REMOTES [facility]

## 2023-07-26 ENCOUNTER — Telehealth (HOSPITAL_COMMUNITY): Payer: Self-pay | Admitting: Pharmacy Technician

## 2023-07-26 NOTE — Telephone Encounter (Signed)
Advanced Heart Failure Patient Advocate Encounter  [facility] requested the provider portion of the Eliquis application. Sent in via fax. This request will be denied until 3% OOP is met.  Archer Asa, CPhT

## 2023-07-27 ENCOUNTER — Other Ambulatory Visit (HOSPITAL_COMMUNITY): Payer: Self-pay

## 2023-07-28 ENCOUNTER — Other Ambulatory Visit: Payer: Self-pay

## 2023-07-28 ENCOUNTER — Other Ambulatory Visit (HOSPITAL_COMMUNITY): Payer: Self-pay

## 2023-08-03 ENCOUNTER — Ambulatory Visit: Payer: PPO | Admitting: Family Medicine

## 2023-08-04 ENCOUNTER — Ambulatory Visit: Payer: PPO | Admitting: Family Medicine

## 2023-08-04 ENCOUNTER — Other Ambulatory Visit (HOSPITAL_COMMUNITY): Payer: Self-pay

## 2023-08-04 NOTE — Progress Notes (Signed)
Paramedicine Encounter    Patient ID: John Keith, male    DOB: Dec 08, 1950, 73 y.o.   MRN: [id number]   Complaints- NONE   Assessment- CAOX4, warm and dry, ambulatory with no shortness of breath, no chest pain, no dizziness, no swelling, lungs clear, vitals at baseline.   Compliance with meds- no missed doses   Pill box filled- two weeks   Refills needed- entresto, zetia   Meds changes since last visit- none     Social changes- none    VISIT SUMMARY- Arrived for home visit for [name] who reports to be feeling well with no complaints. He denied any shortness of breath, chest pain, dizziness, swelling or weight gain. Assessment and vitals completed. Meds reviewed- no missed doses. Pill box filled for two weeks. Refills recorded. We reviewed upcoming appointments and confirmed same. He has no acute issues or needs at present. Will follow up in two weeks. Home visit complete.   There were no vitals taken for this visit. Weight yesterday-- 237lbs  Last visit weight-- 239lbs      ACTION: Home visit completed     Patient Care Team: Dana Allan, MD as PCP - General (Family Medicine) Regan Lemming, MD as PCP - Electrophysiology (Cardiology) [name], Bevelyn Buckles, MD as PCP - Cardiology (Cardiology) Pincus Sanes, MD as Consulting Physician (Internal Medicine) Chilton Si, MD as Attending Physician (Cardiology) Regan Lemming, MD as Consulting Physician (Cardiology)  Patient Active Problem List   Diagnosis Date Noted   Dermatitis 01/31/2023   Onychomycosis 01/31/2023   Statin myopathy 11/15/2022   Abnormal glucose 11/15/2022   Prostate cancer screening 11/15/2022   Chronic systolic heart failure (HCC) 12/15/2021   Chronic combined systolic and diastolic CHF (congestive heart failure) (HCC) 12/15/2021   Nasal sore 12/15/2021   Syncope 12/07/2021   Ventricular tachycardia (HCC) 12/07/2021   Typical atrial flutter (HCC) 09/24/2021   Secondary  hypercoagulable state (HCC) 09/24/2021   Abnormal x-ray of neck 06/16/2021   Abnormal MRI, lumbar spine 10/23/2020   Chronic low back pain 10/23/2020   Neuropathy 10/23/2020   Coronary artery disease of native artery of native heart with stable angina pectoris (HCC) 10/23/2020   Impingement syndrome of left shoulder region 08/22/2020   Pain in joint of left shoulder 07/24/2020   Chronic left shoulder pain 07/11/2020   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   Anemia 12/13/2019   Chest pain of uncertain etiology 10/24/2019   PVC (premature ventricular contraction) 10/24/2019   Gastric ulcer without hemorrhage or perforation 09/28/2019   COVID-19 virus detected 07/12/2019   Elevated troponin 07/07/2019   Elevated serum protein level 07/07/2019   Postherpetic neuralgia 08/17/2018   Benign prostatic hyperplasia 08/01/2018   Hip pain 06/29/2018   Aortic atherosclerosis (HCC) 09/22/2016   Fatigue 06/03/2016   Lower urinary tract symptoms (LUTS) 06/03/2016   Constipation 03/10/2016   Lumbar radiculopathy 02/20/2016   Encounter for immunization 11/26/2015   Insomnia 06/10/2015   External hemorrhoid 03/07/2015   Health care maintenance 01/09/2014   Carpal tunnel syndrome of left wrist 01/03/2013   Hyperlipidemia 11/09/2012   Vitamin D deficiency 11/09/2012   Essential hypertension 11/06/2012   Tobacco abuse 11/06/2012   Seasonal allergies 11/06/2012   Erectile dysfunction 11/06/2012   GERD (gastroesophageal reflux disease) 11/06/2012   DDD (degenerative disc disease), thoracolumbar 11/06/2012   Lactose intolerance 11/06/2012   Diverticulosis of colon without hemorrhage 11/06/2012   Diverticulosis of colon 11/06/2012   Degeneration of thoracolumbar intervertebral disc 11/06/2012   Gastroesophageal reflux disease  11/06/2012   Tobacco user 11/06/2012    Current Outpatient Medications:    amiodarone (PACERONE) 200 MG tablet, Take 0.5 tablets (100 mg total) by mouth daily., Disp: 30 tablet,  Rfl: 5   apixaban (ELIQUIS) 5 MG TABS tablet, Take 1 tablet (5 mg total) by mouth 2 (two) times daily., Disp: 60 tablet, Rfl: 3   calcium carbonate (OS-CAL - DOSED IN MG OF ELEMENTAL CALCIUM) 1250 (500 Ca) MG tablet, Take 1 tablet (500 mg of elemental calcium total) by mouth daily with breakfast., Disp: 30 tablet, Rfl: 11   carvedilol (COREG) 6.25 MG tablet, Take 1 tablet (6.25 mg total) by mouth 2 (two) times daily with a meal., Disp: 180 tablet, Rfl: 3   Cholecalciferol (VITAMIN D-3) 125 MCG (5000 UT) TABS, Take 1 tablet by mouth daily., Disp: 30 tablet, Rfl: 11   ezetimibe (ZETIA) 10 MG tablet, Take 1 tablet (10 mg total) by mouth daily., Disp: 90 tablet, Rfl: 3   FARXIGA 10 MG TABS tablet, Take 1 tablet (10 mg total) by mouth daily., Disp: 90 tablet, Rfl: 3   ferrous sulfate 325 (65 FE) MG tablet, Take 325 mg by mouth daily with breakfast., Disp: , Rfl:    folic acid (FOLVITE) 1 MG tablet, TAKE 1 TABLET(1 MG) BY MOUTH DAILY, Disp: 30 tablet, Rfl: 11   gabapentin (NEURONTIN) 300 MG capsule, Take 600 mg by mouth at bedtime., Disp: , Rfl:    methotrexate (RHEUMATREX) 2.5 MG tablet, TAKE 20MG (8 TABLETS) EVERY WEDNESDAY. PROTECT FROM LIGHT AS DIRECTED, Disp: 150 tablet, Rfl: 0   Multiple Vitamin (MULTIVITAMIN) tablet, Take 1 tablet by mouth daily., Disp: , Rfl:    mupirocin ointment (BACTROBAN) 2 %, Apply 1 Application topically as needed., Disp: , Rfl:    pantoprazole (PROTONIX) 40 MG tablet, Take 1 tablet (40 mg total) by mouth daily. 30 min before food, Disp: 30 tablet, Rfl: 6   sacubitril-valsartan (ENTRESTO) 24-26 MG, Take 1 tablet by mouth 2 (two) times daily., Disp: 180 tablet, Rfl: 3   spironolactone (ALDACTONE) 25 MG tablet, TAKE 1 TABLET(25 MG) BY MOUTH DAILY, Disp: 90 tablet, Rfl: 1 Allergies  Allergen Reactions   Crestor [Rosuvastatin Calcium] Other (See Comments)    Elevated muscle enzymes    Lipitor [Atorvastatin Calcium] Other (See Comments)    Elevated muscle enzymes       Social History   Socioeconomic History   Marital status: Married    Spouse name: Proofreader   Number of children: 2   Years of education: Not on file   Highest education level: High school graduate  Occupational History   Occupation: Retired    Comment: Semi  Tobacco Use   Smoking status: Former    Current packs/day: 0.00    Average packs/day: 0.5 packs/day for 49.0 years (24.5 ttl pk-yrs)    Types: Cigarettes    Start date: 07/06/1971    Quit date: 06/26/2020    Years since quitting: 3.1   Smokeless tobacco: Never   Tobacco comments:    Former smoker 09/24/21  Vaping Use   Vaping status: Never Used  Substance and Sexual Activity   Alcohol use: No    Alcohol/week: 0.0 standard drinks of alcohol   Drug use: No   Sexual activity: Not on file  Other Topics Concern   Not on file  Social History Narrative   Married    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    On child    1 son, 1 daughter   Systems analyst op  Owns guns    Wears seat belt, safe in relationship    Smoker    Retired 06/27/2019   Social Drivers of [employer] Strain: Low Risk  (11/22/2022)   Overall Financial Resource Strain (CARDIA)    Difficulty of Paying Living Expenses: Not hard at all  Food Insecurity: No Food Insecurity (11/22/2022)   Hunger Vital Sign    Worried About Running Out of Food in the Last Year: Never true    Ran Out of Food in the Last Year: Never true  Transportation Needs: No Transportation Needs (11/22/2022)   PRAPARE - Administrator, Civil Service (Medical): No    Lack of Transportation (Non-Medical): No  Physical Activity: Sufficiently Active (11/22/2022)   Exercise Vital Sign    Days of Exercise per Week: 3 days    Minutes of Exercise per Session: 60 min  Stress: No Stress Concern Present (11/22/2022)   [employer] of Occupational Health - Occupational Stress Questionnaire    Feeling of Stress : Not at all  Social Connections: Socially Integrated (11/22/2022)   Social  Connection and Isolation Panel [NHANES]    Frequency of Communication with Friends and Family: Once a week    Frequency of Social Gatherings with Friends and Family: More than three times a week    Attends Religious Services: More than 4 times per year    Active Member of [employer] or Organizations: Yes    Attends Banker Meetings: Never    Marital Status: Married  Catering manager Violence: Not At Risk (11/22/2022)   Humiliation, Afraid, Rape, and Kick questionnaire    Fear of Current or Ex-Partner: No    Emotionally Abused: No    Physically Abused: No    Sexually Abused: No    Physical Exam      Future Appointments  Date Time Provider Department Center  08/11/2023  9:45 AM McCaughan, Carlota Raspberry D, DPM [facility]  08/29/2023  8:00 AM Dana Allan, MD [facility]  09/09/2023  7:00 AM [facility] DEVICE REMOTES [facility]  11/23/2023  8:10 AM [facility] ANNUAL WELLNESS VISIT [facility]  12/09/2023  7:00 AM [facility] DEVICE REMOTES [facility]  03/09/2024  7:00 AM [facility] DEVICE REMOTES [facility]  06/08/2024  7:00 AM [facility] DEVICE REMOTES [facility]

## 2023-08-09 ENCOUNTER — Telehealth (HOSPITAL_COMMUNITY): Payer: Self-pay

## 2023-08-09 ENCOUNTER — Other Ambulatory Visit (HOSPITAL_COMMUNITY): Payer: Self-pay | Admitting: Internal Medicine

## 2023-08-09 MED ORDER — SACUBITRIL-VALSARTAN 24-26 MG PO TABS: 1.0000 | ORAL_TABLET | Freq: Two times a day (BID) | ORAL | 3 refills | Status: DC

## 2023-08-09 NOTE — Telephone Encounter (Signed)
 Refills sent

## 2023-08-09 NOTE — Telephone Encounter (Signed)
HF Paramedicine Message to [facility]  Pharmacy (if applicable): John Keith    Issue/reason for call: Refill   Medication refill? [name], EMT-Paramedic [phone] 08/09/2023

## 2023-08-09 NOTE — Addendum Note (Signed)
Addended by: Janielle Mittelstadt, Milagros Reap on: 08/09/2023 04:40 PM   Modules accepted: Orders

## 2023-08-11 ENCOUNTER — Ambulatory Visit: Payer: PPO | Admitting: Podiatry

## 2023-08-11 DIAGNOSIS — B351 Tinea unguium: Secondary | ICD-10-CM | POA: Diagnosis not present

## 2023-08-11 DIAGNOSIS — M79675 Pain in left toe(s): Secondary | ICD-10-CM

## 2023-08-11 DIAGNOSIS — I739 Peripheral vascular disease, unspecified: Secondary | ICD-10-CM | POA: Diagnosis not present

## 2023-08-11 DIAGNOSIS — M79674 Pain in right toe(s): Secondary | ICD-10-CM | POA: Diagnosis not present

## 2023-08-11 DIAGNOSIS — L84 Corns and callosities: Secondary | ICD-10-CM | POA: Diagnosis not present

## 2023-08-11 NOTE — Progress Notes (Signed)
       Subjective:  Patient ID: John Keith, male    DOB: [birth year],  MRN: [id number]  John Keith presents to clinic today for:  Chief Complaint  Patient presents with   [facility]     RFC with no callous, not diabetic, takes elliquis.    Patient notes nails are thick and elongated, causing pain in shoe gear when ambulating.  He has painful calluses right submet 1 and left submet 3  PCP is [name], MD. last seen around 07/13/2023  Past Medical History:  Diagnosis Date   Allergy    Arthritis    Atrial flutter (HCC)    Carpal tunnel syndrome on left    Chest pain of uncertain etiology 10/24/2019   COVID-19    07/07/19   DDD (degenerative disc disease), thoracolumbar    multilevel   Degenerative joint disease of left shoulder    02/2011    Diverticulosis    left colon (2008)    ED (erectile dysfunction)    02/2011    GERD (gastroesophageal reflux disease)    H. pylori infection    Hyperlipidemia    Hypertension    Insomnia    Lactose intolerance    Low back pain    PVC (premature ventricular contraction) 10/24/2019   Shingles    07/2018   Smoking    smoking since age 29 y.o   Toe fracture, left    4th in 2014   Vitamin D  deficiency     Allergies  Allergen Reactions   Crestor  [Rosuvastatin  Calcium ] Other (See Comments)    Elevated muscle enzymes    Lipitor [Atorvastatin  Calcium ] Other (See Comments)    Elevated muscle enzymes     Objective:  John Keith is a pleasant 73 y.o. male in NAD. AAO x 3.  Vascular Examination: Patient has palpable DP pulse, absent PT pulse bilateral.  Delayed capillary refill bilateral toes.  Sparse digital hair bilateral.  Proximal to distal cooling WNL bilateral.    Dermatological Examination: Interspaces are clear with no open lesions noted bilateral.  Skin is shiny and atrophic bilateral.  Nails are 3-41mm thick, with yellowish/brown discoloration, subungual debris and distal onycholysis x10.  There is pain with compression  of nails x10.  There are hyperkeratotic lesions noted right submet 1 and left submet 3.     Latest Ref Rng & Units 10/29/2022    9:53 AM  Hemoglobin A1C  Hemoglobin-A1c 4.6 - 6.5 % 5.6    Patient qualifies for at-risk foot care because of PVD.  Assessment/Plan: 1. Pain due to onychomycosis of toenails of both feet   2. Callus of foot   3. PVD (peripheral vascular disease) (HCC)    Mycotic nails x10 were sharply debrided with sterile nail nippers and power debriding burr to decrease bulk and length.  Hyperkeratotic lesions x2 on ball of feet were shaved with #312 blade.   Return in about 3 months (around 11/08/2023) for [facility].   Awanda CHARM Imperial, DPM, FACFAS [facility]     [address] [place], [place] [address]                Office [phone]  Fax [phone]

## 2023-08-17 ENCOUNTER — Telehealth (HOSPITAL_COMMUNITY): Payer: Self-pay

## 2023-08-17 ENCOUNTER — Other Ambulatory Visit: Payer: Self-pay

## 2023-08-17 ENCOUNTER — Other Ambulatory Visit (HOSPITAL_COMMUNITY): Payer: Self-pay

## 2023-08-17 ENCOUNTER — Telehealth (HOSPITAL_COMMUNITY): Payer: Self-pay | Admitting: Pharmacy Technician

## 2023-08-17 MED ORDER — CARVEDILOL 6.25 MG PO TABS
6.2500 mg | ORAL_TABLET | Freq: Two times a day (BID) | ORAL | 3 refills | Status: DC
  Filled 2023-08-17 – 2023-10-27 (×2): qty 180, 90d supply, fill #0

## 2023-08-17 MED ORDER — SACUBITRIL-VALSARTAN 24-26 MG PO TABS
1.0000 | ORAL_TABLET | Freq: Two times a day (BID) | ORAL | 3 refills | Status: DC
  Filled 2023-08-17 – 2023-10-27 (×2): qty 180, 90d supply, fill #0
  Filled 2024-02-13: qty 180, 90d supply, fill #1

## 2023-08-17 MED ORDER — FARXIGA 10 MG PO TABS
10.0000 mg | ORAL_TABLET | Freq: Every day | ORAL | 3 refills | Status: DC
  Filled 2023-08-17 – 2023-10-27 (×2): qty 90, 90d supply, fill #0
  Filled 2024-01-27: qty 90, 90d supply, fill #1
  Filled 2024-04-30: qty 90, 90d supply, fill #2

## 2023-08-17 MED ORDER — SPIRONOLACTONE 25 MG PO TABS
25.0000 mg | ORAL_TABLET | Freq: Every day | ORAL | 3 refills | Status: DC
  Filled 2023-08-17: qty 90, 90d supply, fill #0

## 2023-08-17 NOTE — Telephone Encounter (Signed)
HF Paramedicine Message to Advanced [facility] Failure Clinic-   Patient paid $117 for a 3 month supply of Entresto but he has been receiving this for free through [facility] under the [employer] grant. This also applies for his spironolactone and farxiga. He recently paid for the [facility]actone at [facility] as well.   Please verify his grant is active as I see it is until 10/25- if so please send all [employer] appropriate prescriptions to [facility] pharmacy. Thanks!   [name], EMT-Paramedic [phone] 08/17/2023

## 2023-08-17 NOTE — Addendum Note (Signed)
Addended by: Theresia Bough on: 08/17/2023 02:20 PM   Modules accepted: Orders

## 2023-08-17 NOTE — Telephone Encounter (Signed)
Advanced Heart Failure Patient Advocate Encounter  Received a message from W[place]ield (EMT) regarding patients grant. He does currently have an active grant with [employer]. It looks like he may have had his rxs filled at a pharmacy that did not have the information. He ended up paying out of pocket for medications that were covered by the grant.   The plan moving forward is to have the patient fill the covered medications at [facility]. Sent 90 day RX request to Chantel (CMA). Also, sent Heather the reimbursement form. The patient can be refunded the money, with the proper documentation. Can take up to 3 months to receive the refund, via check.  Archer Asa, CPhT

## 2023-08-17 NOTE — Progress Notes (Signed)
Paramedicine Encounter    Patient ID: John Keith, male    DOB: 1951/02/23, 73 y.o.   MRN: [id number]   Complaints- none   Assessment- CAOX4, warm and dry, ambulatory with some shortness of breath while walking quickly or uphill. No lower leg swelling or abdominal distention. Lungs clear. Vitals obtained and at baseline.   Compliance with meds- no missed doses   Pill box filled- for two weeks   Refills needed- folic acid in two weeks   Meds changes since last visit- none     Social changes-  Paid [phone] for a 3 mo supply of Entresto when he has been getting this for free at [facility] under his healthwell grant- I messages patent advocate about same. This was helpful in reaching his 3% OOP for Eliquis but he says he cannot afford it going forward. I will follow up.    VISIT SUMMARY- Arrived for home visit for John Keith where he was seated at the kitchen table alert and oriented reporting to be feeling well. He denied any complaints of shortness of breath, dizziness, or chest pain. He has no lower leg swelling or abdominal distention. Lungs clear. Vitals at baseline for him. He has been compliant with all meds. No missed doses. I reviewed meds and filled pill box for two weeks. Refills as noted will be called in. I reviewed upcoming appointments with him and confirmed same. We discussed heart healthy diet and exercise. He continues to work at [employer] daily and going to [employer] occasionally- he says he is feeling good. Home visit complete. I plan to see John Keith in two weeks.   BP 122/70   Pulse 62   Resp 16   Wt 239 lb (108.4 kg)   SpO2 96%   BMI 25.61 kg/m  Weight yesterday--237lbs  Last visit weight-- 238lbs     ACTION: Home visit completed     Patient Care Team: Dana Allan, MD as PCP - General (Family Medicine) Regan Lemming, MD as PCP - Electrophysiology (Cardiology) [name], Bevelyn Buckles, MD as PCP - Cardiology (Cardiology) Pincus Sanes, MD as Consulting Physician (Internal  Medicine) Chilton Si, MD as Attending Physician (Cardiology) Regan Lemming, MD as Consulting Physician (Cardiology)  Patient Active Problem List   Diagnosis Date Noted   Dermatitis 01/31/2023   Onychomycosis 01/31/2023   Statin myopathy 11/15/2022   Abnormal glucose 11/15/2022   Prostate cancer screening 11/15/2022   Chronic systolic heart failure (HCC) 12/15/2021   Chronic combined systolic and diastolic CHF (congestive heart failure) (HCC) 12/15/2021   Nasal sore 12/15/2021   Syncope 12/07/2021   Ventricular tachycardia (HCC) 12/07/2021   Typical atrial flutter (HCC) 09/24/2021   Secondary hypercoagulable state (HCC) 09/24/2021   Abnormal x-ray of neck 06/16/2021   Abnormal MRI, lumbar spine 10/23/2020   Chronic low back pain 10/23/2020   Neuropathy 10/23/2020   Coronary artery disease of native artery of native heart with stable angina pectoris (HCC) 10/23/2020   Impingement syndrome of left shoulder region 08/22/2020   Pain in joint of left shoulder 07/24/2020   Chronic left shoulder pain 07/11/2020   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   Anemia 12/13/2019   Chest pain of uncertain etiology 10/24/2019   PVC (premature ventricular contraction) 10/24/2019   Gastric ulcer without hemorrhage or perforation 09/28/2019   COVID-19 virus detected 07/12/2019   Elevated troponin 07/07/2019   Elevated serum protein level 07/07/2019   Postherpetic neuralgia 08/17/2018   Benign prostatic hyperplasia 08/01/2018   Hip pain 06/29/2018  Aortic atherosclerosis (HCC) 09/22/2016   Fatigue 06/03/2016   Lower urinary tract symptoms (LUTS) 06/03/2016   Constipation 03/10/2016   Lumbar radiculopathy 02/20/2016   Encounter for immunization 11/26/2015   Insomnia 06/10/2015   External hemorrhoid 03/07/2015   Health care maintenance 01/09/2014   Carpal tunnel syndrome of left wrist 01/03/2013   Hyperlipidemia 11/09/2012   Vitamin D deficiency 11/09/2012   Essential hypertension  11/06/2012   Tobacco abuse 11/06/2012   Seasonal allergies 11/06/2012   Erectile dysfunction 11/06/2012   GERD (gastroesophageal reflux disease) 11/06/2012   DDD (degenerative disc disease), thoracolumbar 11/06/2012   Lactose intolerance 11/06/2012   Diverticulosis of colon without hemorrhage 11/06/2012   Diverticulosis of colon 11/06/2012   Degeneration of thoracolumbar intervertebral disc 11/06/2012   Gastroesophageal reflux disease 11/06/2012   Tobacco user 11/06/2012    Current Outpatient Medications:    amiodarone (PACERONE) 200 MG tablet, Take 0.5 tablets (100 mg total) by mouth daily., Disp: 30 tablet, Rfl: 5   apixaban (ELIQUIS) 5 MG TABS tablet, Take 1 tablet (5 mg total) by mouth 2 (two) times daily., Disp: 60 tablet, Rfl: 3   calcium carbonate (OS-CAL - DOSED IN MG OF ELEMENTAL CALCIUM) 1250 (500 Ca) MG tablet, Take 1 tablet (500 mg of elemental calcium total) by mouth daily with breakfast., Disp: 30 tablet, Rfl: 11   carvedilol (COREG) 6.25 MG tablet, Take 1 tablet (6.25 mg total) by mouth 2 (two) times daily with a meal., Disp: 180 tablet, Rfl: 3   Cholecalciferol (VITAMIN D-3) 125 MCG (5000 UT) TABS, Take 1 tablet by mouth daily., Disp: 30 tablet, Rfl: 11   ezetimibe (ZETIA) 10 MG tablet, Take 1 tablet (10 mg total) by mouth daily., Disp: 90 tablet, Rfl: 3   FARXIGA 10 MG TABS tablet, Take 1 tablet (10 mg total) by mouth daily., Disp: 90 tablet, Rfl: 3   ferrous sulfate 325 (65 FE) MG tablet, Take 325 mg by mouth daily with breakfast., Disp: , Rfl:    folic acid (FOLVITE) 1 MG tablet, TAKE 1 TABLET(1 MG) BY MOUTH DAILY, Disp: 30 tablet, Rfl: 11   gabapentin (NEURONTIN) 300 MG capsule, Take 600 mg by mouth at bedtime., Disp: , Rfl:    methotrexate (RHEUMATREX) 2.5 MG tablet, TAKE 20MG (8 TABLETS) EVERY WEDNESDAY. PROTECT FROM LIGHT AS DIRECTED, Disp: 150 tablet, Rfl: 0   Multiple Vitamin (MULTIVITAMIN) tablet, Take 1 tablet by mouth daily., Disp: , Rfl:    mupirocin ointment  (BACTROBAN) 2 %, Apply 1 Application topically as needed., Disp: , Rfl:    pantoprazole (PROTONIX) 40 MG tablet, Take 1 tablet (40 mg total) by mouth daily. 30 min before food, Disp: 30 tablet, Rfl: 6   sacubitril-valsartan (ENTRESTO) 24-26 MG, Take 1 tablet by mouth 2 (two) times daily., Disp: 180 tablet, Rfl: 3   spironolactone (ALDACTONE) 25 MG tablet, TAKE 1 TABLET(25 MG) BY MOUTH DAILY, Disp: 90 tablet, Rfl: 1 Allergies  Allergen Reactions   Crestor [Rosuvastatin Calcium] Other (See Comments)    Elevated muscle enzymes    Lipitor [Atorvastatin Calcium] Other (See Comments)    Elevated muscle enzymes      Social History   Socioeconomic History   Marital status: Married    Spouse name: Gwendolyn   Number of children: 2   Years of education: Not on file   Highest education level: High school graduate  Occupational History   Occupation: Retired    Comment: Semi  Tobacco Use   Smoking status: Former    Current packs/day: 0.00  Average packs/day: 0.5 packs/day for 49.0 years (24.5 ttl pk-yrs)    Types: Cigarettes    Start date: 07/06/1971    Quit date: 06/26/2020    Years since quitting: 3.1   Smokeless tobacco: Never   Tobacco comments:    Former smoker 09/24/21  Vaping Use   Vaping status: Never Used  Substance and Sexual Activity   Alcohol use: No    Alcohol/week: 0.0 standard drinks of alcohol   Drug use: No   Sexual activity: Not on file  Other Topics Concern   Not on file  Social History Narrative   Married    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    On child    1 son, 1 daughter   Systems analyst op   Owns guns    Wears seat belt, safe in relationship    Smoker    Retired 06/27/2019   Social Drivers of Health   Financial Resource Strain: Low Risk  (11/22/2022)   Overall Financial Resource Strain (CARDIA)    Difficulty of Paying Living Expenses: Not hard at all  Food Insecurity: No Food Insecurity (11/22/2022)   Hunger Vital Sign    Worried About Running Out of Food in the Last  Year: Never true    Ran Out of Food in the Last Year: Never true  Transportation Needs: No Transportation Needs (11/22/2022)   PRAPARE - Administrator, Civil Service (Medical): No    Lack of Transportation (Non-Medical): No  Physical Activity: Sufficiently Active (11/22/2022)   Exercise Vital Sign    Days of Exercise per Week: 3 days    Minutes of Exercise per Session: 60 min  Stress: No Stress Concern Present (11/22/2022)   [employer] of Occupational Health - Occupational Stress Questionnaire    Feeling of Stress : Not at all  Social Connections: Socially Integrated (11/22/2022)   Social Connection and Isolation Panel [NHANES]    Frequency of Communication with Friends and Family: Once a week    Frequency of Social Gatherings with Friends and Family: More than three times a week    Attends Religious Services: More than 4 times per year    Active Member of [employer] or Organizations: Yes    Attends Banker Meetings: Never    Marital Status: Married  Catering manager Violence: Not At Risk (11/22/2022)   Humiliation, Afraid, Rape, and Kick questionnaire    Fear of Current or Ex-Partner: No    Emotionally Abused: No    Physically Abused: No    Sexually Abused: No    Physical Exam      Future Appointments  Date Time Provider Department Center  08/29/2023  8:00 AM Dana Allan, MD [facility]  09/09/2023  7:00 AM [facility] DEVICE REMOTES [facility]  11/23/2023  8:10 AM [facility] ANNUAL WELLNESS VISIT [facility]  11/24/2023  9:15 AM McCaughan, Dia D, DPM [facility]  12/09/2023  7:00 AM [facility] DEVICE REMOTES [facility]  03/09/2024  7:00 AM [facility] DEVICE REMOTES [facility]  06/08/2024  7:00 AM [facility] DEVICE REMOTES [facility]

## 2023-08-29 ENCOUNTER — Ambulatory Visit (INDEPENDENT_AMBULATORY_CARE_PROVIDER_SITE_OTHER): Payer: PPO | Admitting: Family Medicine

## 2023-08-29 ENCOUNTER — Encounter: Payer: Self-pay | Admitting: Family Medicine

## 2023-08-29 VITALS — BP 130/70 | HR 56 | Temp 98.0°F | Resp 18 | Ht >= 80 in | Wt 244.5 lb

## 2023-08-29 DIAGNOSIS — D6869 Other thrombophilia: Secondary | ICD-10-CM | POA: Diagnosis not present

## 2023-08-29 DIAGNOSIS — J3489 Other specified disorders of nose and nasal sinuses: Secondary | ICD-10-CM

## 2023-08-29 DIAGNOSIS — E559 Vitamin D deficiency, unspecified: Secondary | ICD-10-CM | POA: Diagnosis not present

## 2023-08-29 DIAGNOSIS — Z7901 Long term (current) use of anticoagulants: Secondary | ICD-10-CM | POA: Insufficient documentation

## 2023-08-29 DIAGNOSIS — I10 Essential (primary) hypertension: Secondary | ICD-10-CM | POA: Diagnosis not present

## 2023-08-29 DIAGNOSIS — E785 Hyperlipidemia, unspecified: Secondary | ICD-10-CM | POA: Diagnosis not present

## 2023-08-29 DIAGNOSIS — I483 Typical atrial flutter: Secondary | ICD-10-CM | POA: Diagnosis not present

## 2023-08-29 DIAGNOSIS — Z79899 Other long term (current) drug therapy: Secondary | ICD-10-CM | POA: Diagnosis not present

## 2023-08-29 DIAGNOSIS — I700 Atherosclerosis of aorta: Secondary | ICD-10-CM | POA: Diagnosis not present

## 2023-08-29 DIAGNOSIS — I5042 Chronic combined systolic (congestive) and diastolic (congestive) heart failure: Secondary | ICD-10-CM

## 2023-08-29 LAB — CBC
HCT: 40.7 % (ref 39.0–52.0)
Hemoglobin: 13.1 g/dL (ref 13.0–17.0)
MCHC: 32.2 g/dL (ref 30.0–36.0)
MCV: 102.2 fL — ABNORMAL HIGH (ref 78.0–100.0)
Platelets: 292 10*3/uL (ref 150.0–400.0)
RBC: 3.98 Mil/uL — ABNORMAL LOW (ref 4.22–5.81)
RDW: 14.5 % (ref 11.5–15.5)
WBC: 4.3 10*3/uL (ref 4.0–10.5)

## 2023-08-29 LAB — COMPREHENSIVE METABOLIC PANEL
ALT: 19 U/L (ref 0–53)
AST: 20 U/L (ref 0–37)
Albumin: 4.4 g/dL (ref 3.5–5.2)
Alkaline Phosphatase: 77 U/L (ref 39–117)
BUN: 15 mg/dL (ref 6–23)
CO2: 27 meq/L (ref 19–32)
Calcium: 9.1 mg/dL (ref 8.4–10.5)
Chloride: 106 meq/L (ref 96–112)
Creatinine, Ser: 1.16 mg/dL (ref 0.40–1.50)
GFR: 62.34 mL/min (ref >60.00)
Glucose, Bld: 115 mg/dL — ABNORMAL HIGH (ref 70–99)
Potassium: 4.3 meq/L (ref 3.5–5.1)
Sodium: 142 meq/L (ref 135–145)
Total Bilirubin: 0.4 mg/dL (ref 0.2–1.2)
Total Protein: 7.2 g/dL (ref 6.0–8.3)

## 2023-08-29 LAB — LIPID PANEL
Cholesterol: 180 mg/dL (ref 0–200)
HDL: 51.3 mg/dL (ref >39.00)
LDL Cholesterol: 112 mg/dL — ABNORMAL HIGH (ref 0–99)
NonHDL: 128.31
Total CHOL/HDL Ratio: 4
Triglycerides: 80 mg/dL (ref 0.0–149.0)
VLDL: 16 mg/dL (ref 0.0–40.0)

## 2023-08-29 LAB — TSH: TSH: 0.84 u[IU]/mL (ref 0.35–5.50)

## 2023-08-29 MED ORDER — MUPIROCIN 2 % EX OINT: 1.0000 | TOPICAL_OINTMENT | Freq: Every day | CUTANEOUS | 3 refills | Status: DC

## 2023-08-29 NOTE — Patient Instructions (Addendum)
 It was a pleasure meeting you today. Thank you for allowing me to take part in your health care.  Our goals for today as we discussed include:  Continue current medication.  Follow up with Cardiology as scheduled  We will get some labs today.  If they are abnormal or we need to do something about them, I will call you.  If they are normal, I will send you a message on MyChart (if it is active) or a letter in the mail.  If you don't hear from [place] in 2 weeks, please call the office at the number below.   Recommend Shingles vaccine.  This is a 2 dose series and can be given at your local pharmacy.  Please talk to your pharmacist about this. You have one vaccine that I can see.  You need to have the second dose.  Recommend Tetanus Vaccination.  This is given every 10 years.     This is a list of the screening recommended for you and due dates:  Health Maintenance  Topic Date Due   Zoster (Shingles) Vaccine (2 of 2) 08/02/2019   Screening for Lung Cancer  06/11/2022   Flu Shot  02/03/2023   COVID-19 Vaccine (5 - 2024-25 season) 03/06/2023   DTaP/Tdap/Td vaccine (2 - Td or Tdap) 06/16/2023   Medicare Annual Wellness Visit  11/22/2023   Colon Cancer Screening  12/18/2026   Pneumonia Vaccine  Completed   Hepatitis C Screening  Completed   HPV Vaccine  Aged Out      If you have any questions or concerns, please do not hesitate to call the office at [phone].  I look forward to our next visit and until then take care and stay safe.  Regards,   Dana Allan, MD   [facility]

## 2023-08-29 NOTE — Progress Notes (Unsigned)
 SUBJECTIVE:   Chief Complaint  Patient presents with   Medical Management of Chronic Issues    6 month follow up   HPI Follow up for chronic disease management  Discussed the use of AI scribe software for clinical note transcription with the patient, who gave verbal consent to proceed.  History of Present Illness John Keith is a 73 year old male who presents for a follow-up visit and blood work.  He has been fasting since last night, with his last meal around 4 PM and a snack of popcorn and chocolate at 9 PM. He has not eaten anything this morning.  He is currently taking amiodarone, though he is unsure of the duration of use. He also uses a cream for his skin, which he needs a refill for, and has a medication for stomach acid that he plans to pick up today. Methotrexate and other medications are typically managed by his cardiologist, Dr. Augustina Mood.  He mentions having had shingles in the past and is unsure about his vaccination status. He recalls having the shingles vaccine but is uncertain if he completed the two-dose series. He experiences neuropathic pain in his back, which he manages with a cream.  He quit smoking in December 2021 after smoking a pack a day since he was 54 or 73 years old. He does not currently smoke and has not had a CT scan for lung cancer since 2022.  No bleeding, chest pain, shortness of breath, palpitations, fast heart rate, blood in stool or urine, or swelling in his legs. He reports no issues with bowel or urinary function.       PERTINENT PMH / PSH: As above  OBJECTIVE:  BP 130/70   Pulse (!) 56   Temp 98 F (36.7 C)   Resp 18   Ht 6\' 9"  (2.057 m)   Wt 244 lb 8 oz (110.9 kg)   SpO2 97%   BMI 26.20 kg/m    Physical Exam Vitals reviewed.  Constitutional:      General: He is not in acute distress.    Appearance: Normal appearance. He is normal weight. He is not ill-appearing, toxic-appearing or diaphoretic.  Eyes:     General:         Right eye: No discharge.        Left eye: No discharge.  Cardiovascular:     Rate and Rhythm: Normal rate and regular rhythm.     Heart sounds: Normal heart sounds.  Pulmonary:     Effort: Pulmonary effort is normal.     Breath sounds: Normal breath sounds.  Abdominal:     General: Bowel sounds are normal.  Musculoskeletal:        General: Normal range of motion.     Cervical back: Normal range of motion.  Skin:    General: Skin is warm and dry.  Neurological:     Mental Status: He is alert and oriented to person, place, and time. Mental status is at baseline.  Psychiatric:        Mood and Affect: Mood normal.        Behavior: Behavior normal.        Thought Content: Thought content normal.        Judgment: Judgment normal.    {Perform Simple Foot Exam  Perform Detailed exam:1} {Insert foot Exam (Optional):30965}      08/29/2023    8:03 AM 01/31/2023    8:20 AM 11/22/2022    9:34 AM  01/28/2022    1:12 PM 12/15/2021   10:53 AM  Depression screen PHQ 2/9  Decreased Interest 0 0 0 0 0  Down, Depressed, Hopeless 0 0 0 0 0  PHQ - 2 Score 0 0 0 0 0  Altered sleeping 0 0     Tired, decreased energy 0 0     Change in appetite 1 3     Feeling bad or failure about yourself  0 0     Trouble concentrating 0 0     Moving slowly or fidgety/restless 0 0     Suicidal thoughts 0 0     PHQ-9 Score 1 3     Difficult doing work/chores Not difficult at all Not difficult at all         08/29/2023    8:03 AM 01/31/2023    8:21 AM 12/13/2019    8:09 AM 09/28/2019   10:09 AM  GAD 7 : Generalized Anxiety Score  Nervous, Anxious, on Edge 0 0 0 0  Control/stop worrying 0 0 0 0  Worry too much - different things 0 0 0 0  Trouble relaxing 0 0 0 0  Restless 0 0 0 0  Easily annoyed or irritable 0 0 0 0  Afraid - awful might happen 0 0 0 0  Total GAD 7 Score 0 0 0 0  Anxiety Difficulty Not difficult at all Not difficult at all Not difficult at all Not difficult at all    ASSESSMENT/PLAN:   Essential hypertension -     Comprehensive metabolic panel  Vitamin D deficiency  Aortic atherosclerosis (HCC) -     Lipid panel  Typical atrial flutter (HCC) -     CBC  On anticoagulant therapy -     CBC  Nasal sore -     Mupirocin; Apply 1 Application topically daily.  Dispense: 22 g; Refill: 3  On amiodarone therapy -     TSH    Assessment and Plan    Amiodarone use Long-term use of amiodarone with unclear monitoring of thyroid function. -Order thyroid function tests to assess for potential amiodarone-induced thyroid dysfunction.  Routine blood work No recent labs available, patient has upcoming cardiology appointment. -Order comprehensive metabolic panel, lipid panel, and vitamin D level to assess overall health status and provide recent labs for cardiology appointment.  Vaccination status Unclear if patient has received tetanus and shingles vaccines. -Review patient's vaccination history and if not up-to-date, recommend tetanus booster and shingles vaccine series at pharmacy.  Lung cancer screening Patient is a former smoker with unclear history of lung cancer screening. -Review patient's history of lung cancer screening. If criteria met, discuss with patient and consider ordering low-dose CT scan for lung cancer screening.  Follow-up in 6 months or sooner if lab results warrant earlier visit.          PDMP reviewed  Return in about 6 months (around 02/26/2024) for PCP.  Dana Allan, MD

## 2023-08-29 NOTE — Assessment & Plan Note (Signed)
 Chronic.  Asymptomatic and euvolemic on exam. Continue GDMT Continue to follow-up with cardiology as scheduled. -Check labs today

## 2023-09-01 ENCOUNTER — Encounter: Payer: Self-pay | Admitting: Family Medicine

## 2023-09-01 ENCOUNTER — Other Ambulatory Visit (HOSPITAL_COMMUNITY): Payer: Self-pay | Admitting: Internal Medicine

## 2023-09-01 ENCOUNTER — Other Ambulatory Visit (HOSPITAL_COMMUNITY): Payer: Self-pay

## 2023-09-01 NOTE — Assessment & Plan Note (Deleted)
 Currently on Apixiban for A Fib -Continue Eliquis 5 mg BID -Follow up with Cardiology as scheduled

## 2023-09-01 NOTE — Assessment & Plan Note (Signed)
 Well controlled on current medication Continue Coreg 6.25 mg twice daily Continue Aldactone 25 mg daily Follow-up with cardiology as scheduled.

## 2023-09-01 NOTE — Assessment & Plan Note (Signed)
 Long-term use of amiodarone -Order thyroid function tests to assess for potential amiodarone-induced thyroid dysfunction. -Follow up with Cardiology

## 2023-09-01 NOTE — Assessment & Plan Note (Signed)
 Check Vitamin D level

## 2023-09-01 NOTE — Assessment & Plan Note (Signed)
 Rate controlled Continue carvedilol 6.25 mg twice daily Continue Eliquis 5 mg BID Follows with cardiology

## 2023-09-01 NOTE — Assessment & Plan Note (Signed)
 Not on statin due to previous elevated enzymes -Continue Zetia 10 mg daily -Check fasting lipids

## 2023-09-01 NOTE — Progress Notes (Signed)
 Paramedicine Encounter    Patient ID: John Keith, male    DOB: 23-Jan-1951, 73 y.o.   MRN: [id number]   Complaints- none   Assessment- CAOX4, warm and dry, ambulatory without shortness of breath, no lower leg swelling, vitals as baseline, no chest pain, no dizziness, no weight gain.   Compliance with meds- no missed doses   Pill box filled- for two weeks -Plan is to educate on how to fill his own pill box starting next visit- he is agreeable.   Refills needed- eliquis, folic acid   Meds changes since last visit- none     Social changes- none    VISIT SUMMARY- Arrived for home visit for John Keith who was seated at the table alert and oriented with no complaints. He denied any recent weight gain, chest pain, shortness of breath or swelling. Lungs clear. No edema noted. Vitals at baseline. I reviewed meds and filled pill box for two weeks. He agrees to learning to fill his own pill box moving forward. I will print up a list with med details and begin this next visit. We reviewed upcoming appointments and confirmed same. Home visit complete. I will see Selma in two weeks.   BP 110/62   Pulse 63   Resp 16   Wt 237 lb (107.5 kg)   SpO2 97%   BMI 25.40 kg/m  Weight yesterday-- 238lbs  Last visit weight-- 244lbs      ACTION: Home visit completed     Patient Care Team: Dana Allan, MD as PCP - General (Family Medicine) Regan Lemming, MD as PCP - Electrophysiology (Cardiology) [name], Bevelyn Buckles, MD as PCP - Cardiology (Cardiology) Pincus Sanes, MD as Consulting Physician (Internal Medicine) Chilton Si, MD as Attending Physician (Cardiology) Regan Lemming, MD as Consulting Physician (Cardiology)  Patient Active Problem List   Diagnosis Date Noted   On anticoagulant therapy 08/29/2023   On amiodarone therapy 08/29/2023   Dermatitis 01/31/2023   Onychomycosis 01/31/2023   Statin myopathy 11/15/2022   Abnormal glucose 11/15/2022   Prostate cancer  screening 11/15/2022   Chronic systolic heart failure (HCC) 12/15/2021   Chronic combined systolic and diastolic CHF (congestive heart failure) (HCC) 12/15/2021   Nasal sore 12/15/2021   Syncope 12/07/2021   Ventricular tachycardia (HCC) 12/07/2021   Typical atrial flutter (HCC) 09/24/2021   Secondary hypercoagulable state (HCC) 09/24/2021   Abnormal x-ray of neck 06/16/2021   Abnormal MRI, lumbar spine 10/23/2020   Chronic low back pain 10/23/2020   Neuropathy 10/23/2020   Coronary artery disease of native artery of native heart with stable angina pectoris (HCC) 10/23/2020   Impingement syndrome of left shoulder region 08/22/2020   Pain in joint of left shoulder 07/24/2020   Chronic left shoulder pain 07/11/2020   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   Anemia 12/13/2019   Chest pain of uncertain etiology 10/24/2019   PVC (premature ventricular contraction) 10/24/2019   Gastric ulcer without hemorrhage or perforation 09/28/2019   COVID-19 virus detected 07/12/2019   Elevated troponin 07/07/2019   Elevated serum protein level 07/07/2019   Postherpetic neuralgia 08/17/2018   Benign prostatic hyperplasia 08/01/2018   Hip pain 06/29/2018   Aortic atherosclerosis (HCC) 09/22/2016   Fatigue 06/03/2016   Lower urinary tract symptoms (LUTS) 06/03/2016   Constipation 03/10/2016   Lumbar radiculopathy 02/20/2016   Encounter for immunization 11/26/2015   Insomnia 06/10/2015   External hemorrhoid 03/07/2015   Health care maintenance 01/09/2014   Carpal tunnel syndrome of left wrist 01/03/2013  Hyperlipidemia 11/09/2012   Vitamin D deficiency 11/09/2012   Essential hypertension 11/06/2012   Tobacco abuse 11/06/2012   Seasonal allergies 11/06/2012   Erectile dysfunction 11/06/2012   GERD (gastroesophageal reflux disease) 11/06/2012   DDD (degenerative disc disease), thoracolumbar 11/06/2012   Lactose intolerance 11/06/2012   Diverticulosis of colon without hemorrhage 11/06/2012    Diverticulosis of colon 11/06/2012   Degeneration of thoracolumbar intervertebral disc 11/06/2012   Gastroesophageal reflux disease 11/06/2012   Tobacco user 11/06/2012    Current Outpatient Medications:    amiodarone (PACERONE) 200 MG tablet, Take 0.5 tablets (100 mg total) by mouth daily., Disp: 30 tablet, Rfl: 5   apixaban (ELIQUIS) 5 MG TABS tablet, Take 1 tablet (5 mg total) by mouth 2 (two) times daily., Disp: 60 tablet, Rfl: 3   calcium carbonate (OS-CAL - DOSED IN MG OF ELEMENTAL CALCIUM) 1250 (500 Ca) MG tablet, Take 1 tablet (500 mg of elemental calcium total) by mouth daily with breakfast., Disp: 30 tablet, Rfl: 11   carvedilol (COREG) 6.25 MG tablet, Take 1 tablet (6.25 mg total) by mouth 2 (two) times daily with a meal., Disp: 180 tablet, Rfl: 3   Cholecalciferol (VITAMIN D-3) 125 MCG (5000 UT) TABS, Take 1 tablet by mouth daily., Disp: 30 tablet, Rfl: 11   ezetimibe (ZETIA) 10 MG tablet, Take 1 tablet (10 mg total) by mouth daily., Disp: 90 tablet, Rfl: 3   FARXIGA 10 MG TABS tablet, Take 1 tablet (10 mg total) by mouth daily., Disp: 90 tablet, Rfl: 3   ferrous sulfate 325 (65 FE) MG tablet, Take 325 mg by mouth daily with breakfast., Disp: , Rfl:    folic acid (FOLVITE) 1 MG tablet, TAKE 1 TABLET(1 MG) BY MOUTH DAILY, Disp: 30 tablet, Rfl: 11   gabapentin (NEURONTIN) 300 MG capsule, Take 600 mg by mouth at bedtime., Disp: , Rfl:    methotrexate (RHEUMATREX) 2.5 MG tablet, TAKE 20MG (8 TABLETS) EVERY WEDNESDAY. PROTECT FROM LIGHT AS DIRECTED, Disp: 150 tablet, Rfl: 0   Multiple Vitamin (MULTIVITAMIN) tablet, Take 1 tablet by mouth daily., Disp: , Rfl:    mupirocin ointment (BACTROBAN) 2 %, Apply 1 Application topically daily., Disp: 22 g, Rfl: 3   pantoprazole (PROTONIX) 40 MG tablet, Take 1 tablet (40 mg total) by mouth daily. 30 min before food, Disp: 30 tablet, Rfl: 6   sacubitril-valsartan (ENTRESTO) 24-26 MG, Take 1 tablet by mouth 2 (two) times daily., Disp: 180 tablet, Rfl:  3   spironolactone (ALDACTONE) 25 MG tablet, Take 1 tablet (25 mg total) by mouth daily., Disp: 90 tablet, Rfl: 3 Allergies  Allergen Reactions   Crestor [Rosuvastatin Calcium] Other (See Comments)    Elevated muscle enzymes    Lipitor [Atorvastatin Calcium] Other (See Comments)    Elevated muscle enzymes      Social History   Socioeconomic History   Marital status: Married    Spouse name: Gwendolyn   Number of children: 2   Years of education: Not on file   Highest education level: High school graduate  Occupational History   Occupation: Retired    Comment: Semi  Tobacco Use   Smoking status: Former    Current packs/day: 0.00    Average packs/day: 0.5 packs/day for 49.0 years (24.5 ttl pk-yrs)    Types: Cigarettes    Start date: 07/06/1971    Quit date: 06/26/2020    Years since quitting: 3.1   Smokeless tobacco: Never   Tobacco comments:    Former smoker 09/24/21  Vaping Use  Vaping status: Never Used  Substance and Sexual Activity   Alcohol use: No    Alcohol/week: 0.0 standard drinks of alcohol   Drug use: No   Sexual activity: Not on file  Other Topics Concern   Not on file  Social History Narrative   Married    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    On child    1 son, 1 daughter   Machine op   Owns guns    Wears seat belt, safe in relationship    Smoker    Retired 06/27/2019   Social Drivers of Health   Financial Resource Strain: Low Risk  (11/22/2022)   Overall Financial Resource Strain (CARDIA)    Difficulty of Paying Living Expenses: Not hard at all  Food Insecurity: No Food Insecurity (11/22/2022)   Hunger Vital Sign    Worried About Running Out of Food in the Last Year: Never true    Ran Out of Food in the Last Year: Never true  Transportation Needs: No Transportation Needs (11/22/2022)   PRAPARE - Administrator, Civil Service (Medical): No    Lack of Transportation (Non-Medical): No  Physical Activity: Sufficiently Active (11/22/2022)   Exercise Vital  Sign    Days of Exercise per Week: 3 days    Minutes of Exercise per Session: 60 min  Stress: No Stress Concern Present (11/22/2022)   [employer] of Occupational Health - Occupational Stress Questionnaire    Feeling of Stress : Not at all  Social Connections: Socially Integrated (11/22/2022)   Social Connection and Isolation Panel [NHANES]    Frequency of Communication with Friends and Family: Once a week    Frequency of Social Gatherings with Friends and Family: More than three times a week    Attends Religious Services: More than 4 times per year    Active Member of [employer] or Organizations: Yes    Attends Banker Meetings: Never    Marital Status: Married  Catering manager Violence: Not At Risk (11/22/2022)   Humiliation, Afraid, Rape, and Kick questionnaire    Fear of Current or Ex-Partner: No    Emotionally Abused: No    Physically Abused: No    Sexually Abused: No    Physical Exam      Future Appointments  Date Time Provider Department Center  09/09/2023  7:00 AM [facility] DEVICE REMOTES [facility]  11/23/2023  8:10 AM [facility] ANNUAL WELLNESS VISIT [facility]  11/24/2023  9:15 AM McCaughan, Dia D, DPM [facility]  12/09/2023  7:00 AM [facility] DEVICE REMOTES [facility]  02/27/2024  8:40 AM Dana Allan, MD [facility]  03/09/2024  7:00 AM [facility] DEVICE REMOTES [facility]  06/08/2024  7:00 AM [facility] DEVICE REMOTES [facility]

## 2023-09-01 NOTE — Assessment & Plan Note (Signed)
 Chronic use of Eliquis for Afib, rate controlled No signs of bleeding

## 2023-09-01 NOTE — Assessment & Plan Note (Signed)
 Refill Mupirocin

## 2023-09-05 ENCOUNTER — Other Ambulatory Visit (HOSPITAL_COMMUNITY): Payer: Self-pay

## 2023-09-09 ENCOUNTER — Ambulatory Visit (INDEPENDENT_AMBULATORY_CARE_PROVIDER_SITE_OTHER): Payer: PPO

## 2023-09-09 DIAGNOSIS — I428 Other cardiomyopathies: Secondary | ICD-10-CM

## 2023-09-11 LAB — CUP PACEART REMOTE DEVICE CHECK
Battery Remaining Longevity: 126 mo
Battery Voltage: 3.02 V
Brady Statistic RV Percent Paced: 0.09 %
Date Time Interrogation Session: 20250307012302
HighPow Impedance: 70 Ohm
Implantable Lead Connection Status: 753985
Implantable Lead Implant Date: 20230608
Implantable Lead Location: 753860
Implantable Pulse Generator Implant Date: 20230608
Lead Channel Impedance Value: 285 Ohm
Lead Channel Impedance Value: 380 Ohm
Lead Channel Pacing Threshold Amplitude: 0.75 V
Lead Channel Pacing Threshold Pulse Width: 0.4 ms
Lead Channel Sensing Intrinsic Amplitude: 11 mV
Lead Channel Sensing Intrinsic Amplitude: 11 mV
Lead Channel Setting Pacing Amplitude: 2 V
Lead Channel Setting Pacing Pulse Width: 0.4 ms
Lead Channel Setting Sensing Sensitivity: 0.3 mV
Zone Setting Status: 755011
Zone Setting Status: 755011

## 2023-09-15 ENCOUNTER — Other Ambulatory Visit (HOSPITAL_COMMUNITY): Payer: Self-pay

## 2023-09-15 NOTE — Progress Notes (Signed)
 Paramedicine Encounter    Patient ID: John Keith, male    DOB: 08-Sep-1950, 73 y.o.   MRN: [id number]   Complaints- none   Assessment- CAOX4, warm and dry reporting to be feeling good. No lower leg swelling, no weight gain. Lungs clear. Vitals at baseline.   Compliance with meds- no missed doses   Pill box filled- for one week   Refills needed- none   Meds changes since last visit- none     Social changes- none    VISIT SUMMARY-- Arrived for home visit for John Keith who reports to be feeling well with no complaints. He has had no complaints. No missed doses in the last week. Meds reviewed and confirmed. Pill box filled for one week. Appointments confirmed.   -I had him sign the reimbursement form for his John Keith- he has to obtain a bank statement to turn in with same. I will assist with the rest of the paperwork next week.   BP 122/72   Pulse 72   Resp 16   Wt 238 lb (108 kg)   SpO2 97%   BMI 25.50 kg/m  Weight yesterday-- 237lbs  Last visit weight-- 238lbs      ACTION: Home visit completed     Patient Care Team: Dana Allan, MD as PCP - General (Family Medicine) Regan Lemming, MD as PCP - Electrophysiology (Cardiology) [name], Bevelyn Buckles, MD as PCP - Cardiology (Cardiology) Pincus Sanes, MD as Consulting Physician (Internal Medicine) Chilton Si, MD as Attending Physician (Cardiology) Regan Lemming, MD as Consulting Physician (Cardiology)  Patient Active Problem List   Diagnosis Date Noted   On anticoagulant therapy 08/29/2023   On amiodarone therapy 08/29/2023   Dermatitis 01/31/2023   Onychomycosis 01/31/2023   Statin myopathy 11/15/2022   Abnormal glucose 11/15/2022   Prostate cancer screening 11/15/2022   Chronic systolic heart failure (HCC) 12/15/2021   Chronic combined systolic and diastolic CHF (congestive heart failure) (HCC) 12/15/2021   Nasal sore 12/15/2021   Syncope 12/07/2021   Ventricular tachycardia (HCC)  12/07/2021   Typical atrial flutter (HCC) 09/24/2021   Secondary hypercoagulable state (HCC) 09/24/2021   Abnormal x-ray of neck 06/16/2021   Abnormal MRI, lumbar spine 10/23/2020   Chronic low back pain 10/23/2020   Neuropathy 10/23/2020   Coronary artery disease of native artery of native heart with stable angina pectoris (HCC) 10/23/2020   Impingement syndrome of left shoulder region 08/22/2020   Pain in joint of left shoulder 07/24/2020   Chronic left shoulder pain 07/11/2020   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   Anemia 12/13/2019   Chest pain of uncertain etiology 10/24/2019   PVC (premature ventricular contraction) 10/24/2019   Gastric ulcer without hemorrhage or perforation 09/28/2019   COVID-19 virus detected 07/12/2019   Elevated troponin 07/07/2019   Elevated serum protein level 07/07/2019   Postherpetic neuralgia 08/17/2018   Benign prostatic hyperplasia 08/01/2018   Hip pain 06/29/2018   Aortic atherosclerosis (HCC) 09/22/2016   Fatigue 06/03/2016   Lower urinary tract symptoms (LUTS) 06/03/2016   Constipation 03/10/2016   Lumbar radiculopathy 02/20/2016   Encounter for immunization 11/26/2015   Insomnia 06/10/2015   External hemorrhoid 03/07/2015   Health care maintenance 01/09/2014   Carpal tunnel syndrome of left wrist 01/03/2013   Hyperlipidemia 11/09/2012   Vitamin D deficiency 11/09/2012   Essential hypertension 11/06/2012   Tobacco abuse 11/06/2012   Seasonal allergies 11/06/2012   Erectile dysfunction 11/06/2012   GERD (gastroesophageal reflux disease) 11/06/2012   DDD (degenerative disc disease),  thoracolumbar 11/06/2012   Lactose intolerance 11/06/2012   Diverticulosis of colon without hemorrhage 11/06/2012   Diverticulosis of colon 11/06/2012   Degeneration of thoracolumbar intervertebral disc 11/06/2012   Gastroesophageal reflux disease 11/06/2012   Tobacco user 11/06/2012    Current Outpatient Medications:    amiodarone (PACERONE) 200 MG  tablet, Take 0.5 tablets (100 mg total) by mouth daily., Disp: 30 tablet, Rfl: 5   calcium carbonate (OS-CAL - DOSED IN MG OF ELEMENTAL CALCIUM) 1250 (500 Ca) MG tablet, Take 1 tablet (500 mg of elemental calcium total) by mouth daily with breakfast., Disp: 30 tablet, Rfl: 11   carvedilol (COREG) 6.25 MG tablet, Take 1 tablet (6.25 mg total) by mouth 2 (two) times daily with a meal., Disp: 180 tablet, Rfl: 3   Cholecalciferol (VITAMIN D-3) 125 MCG (5000 UT) TABS, Take 1 tablet by mouth daily., Disp: 30 tablet, Rfl: 11   ELIQUIS 5 MG TABS tablet, TAKE 1 TABLET(5 MG) BY MOUTH TWICE DAILY, Disp: 60 tablet, Rfl: 3   ezetimibe (ZETIA) 10 MG tablet, Take 1 tablet (10 mg total) by mouth daily., Disp: 90 tablet, Rfl: 3   FARXIGA 10 MG TABS tablet, Take 1 tablet (10 mg total) by mouth daily., Disp: 90 tablet, Rfl: 3   ferrous sulfate 325 (65 FE) MG tablet, Take 325 mg by mouth daily with breakfast., Disp: , Rfl:    folic acid (FOLVITE) 1 MG tablet, TAKE 1 TABLET(1 MG) BY MOUTH DAILY, Disp: 30 tablet, Rfl: 11   gabapentin (NEURONTIN) 300 MG capsule, Take 600 mg by mouth at bedtime., Disp: , Rfl:    methotrexate (RHEUMATREX) 2.5 MG tablet, TAKE 20MG (8 TABLETS) EVERY WEDNESDAY. PROTECT FROM LIGHT AS DIRECTED, Disp: 150 tablet, Rfl: 0   Multiple Vitamin (MULTIVITAMIN) tablet, Take 1 tablet by mouth daily., Disp: , Rfl:    mupirocin ointment (BACTROBAN) 2 %, Apply 1 Application topically daily., Disp: 22 g, Rfl: 3   pantoprazole (PROTONIX) 40 MG tablet, Take 1 tablet (40 mg total) by mouth daily. 30 min before food, Disp: 30 tablet, Rfl: 6   sacubitril-valsartan (ENTRESTO) 24-26 MG, Take 1 tablet by mouth 2 (two) times daily., Disp: 180 tablet, Rfl: 3   spironolactone (ALDACTONE) 25 MG tablet, Take 1 tablet (25 mg total) by mouth daily., Disp: 90 tablet, Rfl: 3 Allergies  Allergen Reactions   Crestor [Rosuvastatin Calcium] Other (See Comments)    Elevated muscle enzymes    Lipitor [Atorvastatin Calcium] Other  (See Comments)    Elevated muscle enzymes      Social History   Socioeconomic History   Marital status: Married    Spouse name: Gwendolyn   Number of children: 2   Years of education: Not on file   Highest education level: High school graduate  Occupational History   Occupation: Retired    Comment: Semi  Tobacco Use   Smoking status: Former    Current packs/day: 0.00    Average packs/day: 0.5 packs/day for 49.0 years (24.5 ttl pk-yrs)    Types: Cigarettes    Start date: 07/06/1971    Quit date: 06/26/2020    Years since quitting: 3.2   Smokeless tobacco: Never   Tobacco comments:    Former smoker 09/24/21  Vaping Use   Vaping status: Never Used  Substance and Sexual Activity   Alcohol use: No    Alcohol/week: 0.0 standard drinks of alcohol   Drug use: No   Sexual activity: Not on file  Other Topics Concern   Not on file  Social History Narrative   Married    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    On child    1 son, 1 daughter   Systems analyst op   Owns guns    Wears seat belt, safe in relationship    Smoker    Retired 06/27/2019   Social Drivers of [employer] Strain: Low Risk  (11/22/2022)   Overall Financial Resource Strain (CARDIA)    Difficulty of Paying Living Expenses: Not hard at all  Food Insecurity: No Food Insecurity (11/22/2022)   Hunger Vital Sign    Worried About Running Out of Food in the Last Year: Never true    Ran Out of Food in the Last Year: Never true  Transportation Needs: No Transportation Needs (11/22/2022)   PRAPARE - Administrator, Civil Service (Medical): No    Lack of Transportation (Non-Medical): No  Physical Activity: Sufficiently Active (11/22/2022)   Exercise Vital Sign    Days of Exercise per Week: 3 days    Minutes of Exercise per Session: 60 min  Stress: No Stress Concern Present (11/22/2022)   [employer] of Occupational Health - Occupational Stress Questionnaire    Feeling of Stress : Not at all  Social Connections:  Socially Integrated (11/22/2022)   Social Connection and Isolation Panel [NHANES]    Frequency of Communication with Friends and Family: Once a week    Frequency of Social Gatherings with Friends and Family: More than three times a week    Attends Religious Services: More than 4 times per year    Active Member of [employer] or Organizations: Yes    Attends Banker Meetings: Never    Marital Status: Married  Catering manager Violence: Not At Risk (11/22/2022)   Humiliation, Afraid, Rape, and Kick questionnaire    Fear of Current or Ex-Partner: No    Emotionally Abused: No    Physically Abused: No    Sexually Abused: No    Physical Exam      Future Appointments  Date Time Provider Department Center  11/23/2023  8:10 AM [facility]NUAL WELLNESS VISIT [facility]  11/24/2023  9:15 AM McCaughan, Dia D, DPM [facility]  12/09/2023  7:00 AM [facility] DEVICE REMOTES [facility]  02/27/2024  8:40 AM Dana Allan, MD [facility]  03/09/2024  7:00 AM [facility] DEVICE REMOTES [facility]  06/08/2024  7:00 AM [facility] DEVICE [facility]ES [facility]

## 2023-09-16 NOTE — Addendum Note (Signed)
 Encounter addended by: Howell Rucks, RDCS on: 09/16/2023 2:07 PM  Actions taken: Imaging Exam ended

## 2023-09-22 ENCOUNTER — Telehealth (HOSPITAL_COMMUNITY): Payer: Self-pay

## 2023-09-22 ENCOUNTER — Other Ambulatory Visit (HOSPITAL_COMMUNITY): Payer: Self-pay

## 2023-09-22 DIAGNOSIS — E785 Hyperlipidemia, unspecified: Secondary | ICD-10-CM

## 2023-09-22 NOTE — Progress Notes (Signed)
 Paramedicine Encounter    Patient ID: John Keith, male    DOB: 1950/07/31, 73 y.o.   MRN: [id number]   Complaints- none   Assessment- CAOX4, warm and dry with no complaints.   Compliance with meds- missed Sunday mornings dose   Pill box filled- one week   Refills needed- vitamin d   Meds changes since last visit- none     Social changes- [facility]- reports [facility] [employer] told him he had to use a pharmacy that was "in network" with their insurance. [facility]. I will follow up with getting him moved.    VISIT SUMMARY- Arrived for home visit for John Keith who reports he is feeling well with no complaints today. He denied any shortness of breath, chest pain or dizziness. No weight gain or swelling. Lungs clear. Vitals obtained as noted and at baseline. I reviewed meds and confirmed same filling one week of meds in pill box. I obtained paperwork for [employer] reimbursement I will turn in same. Appointments reviewed. I will follow up in one week.   BP 120/70   Pulse 69   Resp 16   Wt 235 lb (106.6 kg)   BMI 25.18 kg/m  Weight yesterday-- 235.4lbs  Last visit weight-- 238lbs    [name], EMT-Paramedic [phone] 09/22/2023   ACTION: Home visit completed     Patient Care Team: Dana Allan, MD as PCP - General (Family Medicine) Regan Lemming, MD as PCP - Electrophysiology (Cardiology) [name], Bevelyn Buckles, MD as PCP - Cardiology (Cardiology) Pincus Sanes, MD as Consulting Physician (Internal Medicine) Chilton Si, MD as Attending Physician (Cardiology) Regan Lemming, MD as Consulting Physician (Cardiology)  Patient Active Problem List   Diagnosis Date Noted   On anticoagulant therapy 08/29/2023   On amiodarone therapy 08/29/2023   Dermatitis 01/31/2023   Onychomycosis 01/31/2023   Statin myopathy 11/15/2022   Abnormal glucose 11/15/2022   Prostate cancer screening 11/15/2022   Chronic systolic heart  failure (HCC) 86/57/8469   Chronic combined systolic and diastolic CHF (congestive heart failure) (HCC) 12/15/2021   Nasal sore 12/15/2021   Syncope 12/07/2021   Ventricular tachycardia (HCC) 12/07/2021   Typical atrial flutter (HCC) 09/24/2021   Secondary hypercoagulable state (HCC) 09/24/2021   Abnormal x-ray of neck 06/16/2021   Abnormal MRI, lumbar spine 10/23/2020   Chronic low back pain 10/23/2020   Neuropathy 10/23/2020   Coronary artery disease of native artery of native heart with stable angina pectoris (HCC) 10/23/2020   Impingement syndrome of left shoulder region 08/22/2020   Pain in joint of left shoulder 07/24/2020   Chronic left shoulder pain 07/11/2020   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   Anemia 12/13/2019   Chest pain of uncertain etiology 10/24/2019   PVC (premature ventricular contraction) 10/24/2019   Gastric ulcer without hemorrhage or perforation 09/28/2019   COVID-19 virus detected 07/12/2019   Elevated troponin 07/07/2019   Elevated serum protein level 07/07/2019   Postherpetic neuralgia 08/17/2018   Benign prostatic hyperplasia 08/01/2018   Hip pain 06/29/2018   Aortic atherosclerosis (HCC) 09/22/2016   Fatigue 06/03/2016   Lower urinary tract symptoms (LUTS) 06/03/2016   Constipation 03/10/2016   Lumbar radiculopathy 02/20/2016   Encounter for immunization 11/26/2015   Insomnia 06/10/2015   External hemorrhoid 03/07/2015   Health care maintenance 01/09/2014   Carpal tunnel syndrome of left wrist 01/03/2013   Hyperlipidemia 11/09/2012   Vitamin D deficiency 11/09/2012   Essential hypertension 11/06/2012   Tobacco abuse 11/06/2012  Seasonal allergies 11/06/2012   Erectile dysfunction 11/06/2012   GERD (gastroesophageal reflux disease) 11/06/2012   DDD (degenerative disc disease), thoracolumbar 11/06/2012   Lactose intolerance 11/06/2012   Diverticulosis of colon without hemorrhage 11/06/2012   Diverticulosis of colon 11/06/2012   Degeneration of  thoracolumbar intervertebral disc 11/06/2012   Gastroesophageal reflux disease 11/06/2012   Tobacco user 11/06/2012    Current Outpatient Medications:    amiodarone (PACERONE) 200 MG tablet, Take 0.5 tablets (100 mg total) by mouth daily., Disp: 30 tablet, Rfl: 5   calcium carbonate (OS-CAL - DOSED IN MG OF ELEMENTAL CALCIUM) 1250 (500 Ca) MG tablet, Take 1 tablet (500 mg of elemental calcium total) by mouth daily with breakfast., Disp: 30 tablet, Rfl: 11   carvedilol (COREG) 6.25 MG tablet, Take 1 tablet (6.25 mg total) by mouth 2 (two) times daily with a meal., Disp: 180 tablet, Rfl: 3   Cholecalciferol (VITAMIN D-3) 125 MCG (5000 UT) TABS, Take 1 tablet by mouth daily., Disp: 30 tablet, Rfl: 11   ELIQUIS 5 MG TABS tablet, TAKE 1 TABLET(5 MG) BY MOUTH TWICE DAILY, Disp: 60 tablet, Rfl: 3   ezetimibe (ZETIA) 10 MG tablet, Take 1 tablet (10 mg total) by mouth daily., Disp: 90 tablet, Rfl: 3   FARXIGA 10 MG TABS tablet, Take 1 tablet (10 mg total) by mouth daily., Disp: 90 tablet, Rfl: 3   ferrous sulfate 325 (65 FE) MG tablet, Take 325 mg by mouth daily with breakfast., Disp: , Rfl:    folic acid (FOLVITE) 1 MG tablet, TAKE 1 TABLET(1 MG) BY MOUTH DAILY, Disp: 30 tablet, Rfl: 11   gabapentin (NEURONTIN) 300 MG capsule, Take 600 mg by mouth at bedtime., Disp: , Rfl:    methotrexate (RHEUMATREX) 2.5 MG tablet, TAKE 20MG (8 TABLETS) EVERY WEDNESDAY. PROTECT FROM LIGHT AS DIRECTED, Disp: 150 tablet, Rfl: 0   Multiple Vitamin (MULTIVITAMIN) tablet, Take 1 tablet by mouth daily., Disp: , Rfl:    mupirocin ointment (BACTROBAN) 2 %, Apply 1 Application topically daily., Disp: 22 g, Rfl: 3   pantoprazole (PROTONIX) 40 MG tablet, Take 1 tablet (40 mg total) by mouth daily. 30 min before food, Disp: 30 tablet, Rfl: 6   sacubitril-valsartan (ENTRESTO) 24-26 MG, Take 1 tablet by mouth 2 (two) times daily., Disp: 180 tablet, Rfl: 3   spironolactone (ALDACTONE) 25 MG tablet, Take 1 tablet (25 mg total) by mouth  daily., Disp: 90 tablet, Rfl: 3 Allergies  Allergen Reactions   Crestor [Rosuvastatin Calcium] Other (See Comments)    Elevated muscle enzymes    Lipitor [Atorvastatin Calcium] Other (See Comments)    Elevated muscle enzymes      Social History   Socioeconomic History   Marital status: Married    Spouse name: Gwendolyn   Number of children: 2   Years of education: Not on file   Highest education level: High school graduate  Occupational History   Occupation: Retired    Comment: Semi  Tobacco Use   Smoking status: Former    Current packs/day: 0.00    Average packs/day: 0.5 packs/day for 49.0 years (24.5 ttl pk-yrs)    Types: Cigarettes    Start date: 07/06/1971    Quit date: 06/26/2020    Years since quitting: 3.2   Smokeless tobacco: Never   Tobacco comments:    Former smoker 09/24/21  Vaping Use   Vaping status: Never Used  Substance and Sexual Activity   Alcohol use: No    Alcohol/week: 0.0 standard drinks of alcohol  Drug use: No   Sexual activity: Not on file  Other Topics Concern   Not on file  Social History Narrative   Married    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    On child    1 son, 1 daughter   Machine op   Owns guns    Wears seat belt, safe in relationship    Smoker    Retired 06/27/2019   Social Drivers of Health   Financial Resource Strain: Low Risk  (11/22/2022)   Overall Financial Resource Strain (CARDIA)    Difficulty of Paying Living Expenses: Not hard at all  Food Insecurity: No Food Insecurity (11/22/2022)   Hunger Vital Sign    Worried About Running Out of Food in the Last Year: Never true    Ran Out of Food in the Last Year: Never true  Transportation Needs: No Transportation Needs (11/22/2022)   PRAPARE - Administrator, Civil Service (Medical): No    Lack of Transportation (Non-Medical): No  Physical Activity: Sufficiently Active (11/22/2022)   Exercise Vital Sign    Days of Exercise per Week: 3 days    Minutes of Exercise per Session: 60  min  Stress: No Stress Concern Present (11/22/2022)   [employer] of Occupational Health - Occupational Stress Questionnaire    Feeling of Stress : Not at all  Social Connections: Socially Integrated (11/22/2022)   Social Connection and Isolation Panel [NHANES]    Frequency of Communication with Friends and Family: Once a week    Frequency of Social Gatherings with Friends and Family: More than three times a week    Attends Religious Services: More than 4 times per year    Active Member of [employer] or Organizations: Yes    Attends Banker Meetings: Never    Marital Status: Married  Catering manager Violence: Not At Risk (11/22/2022)   Humiliation, Afraid, Rape, and Kick questionnaire    Fear of Current or Ex-Partner: No    Emotionally Abused: No    Physically Abused: No    Sexually Abused: No    Physical Exam      Future Appointments  Date Time Provider Department Center  11/23/2023  8:10 AM [facility] ANNUAL WELLNESS VISIT [facility]  11/24/2023  9:15 AM McCaughan, Dia D, DPM [facility]  12/09/2023  7:00 AM [facility] DEVICE REMOTES [facility]  02/27/2024  8:40 AM Dana Allan, MD [facility]  03/09/2024  7:00 AM [facility] DEVICE REMOTES [facility]  06/08/2024  7:00 AM [facility] DEVICE REMOTES [facility]

## 2023-09-22 NOTE — Telephone Encounter (Signed)
   Pharmacy (if applicable): WAS USING [facility] BUT IS NEEDING TO TRANSFER TO [facility] [place]    Issue/reason for call:  Jia reports his insurance company contacted him to switch to a pharmacy that is in network with [facility] insurance [facility]. [facility] is now [facility]ing [facility] [place]. I will send message to PCP and [facility] to [facility] all [facility] [place].   [name], EMT-Paramedic [phone] 09/22/2023

## 2023-09-23 ENCOUNTER — Telehealth (HOSPITAL_COMMUNITY): Payer: Self-pay | Admitting: Internal Medicine

## 2023-09-23 ENCOUNTER — Other Ambulatory Visit: Payer: Self-pay

## 2023-09-23 MED ORDER — APIXABAN 5 MG PO TABS: 5.0000 mg | ORAL_TABLET | Freq: Two times a day (BID) | ORAL | 3 refills | Status: DC

## 2023-09-23 MED ORDER — AMIODARONE HCL 200 MG PO TABS: 100.0000 mg | ORAL_TABLET | Freq: Every day | ORAL | 1 refills | Status: DC

## 2023-09-23 MED ORDER — EZETIMIBE 10 MG PO TABS: 10.0000 mg | ORAL_TABLET | Freq: Every day | ORAL | 3 refills | Status: DC

## 2023-09-23 NOTE — Telephone Encounter (Signed)
 Refills for Amio, Eliquis, and [facility], other cardiac meds come from [facility] outpt pharmacy due to grant.

## 2023-09-23 NOTE — Addendum Note (Signed)
 Addended by: Noralee Space on: 09/23/2023 12:09 PM   Modules accepted: Orders

## 2023-09-23 NOTE — Telephone Encounter (Signed)
 [facility] [place].

## 2023-09-23 NOTE — Telephone Encounter (Signed)
 Front office left message for patient on his mobile telephone to schedule a f/u appt with Dr. Gala Romney in May 2025. Patient is due for a 6 mo f/u appt with DB in May 2025.

## 2023-09-27 ENCOUNTER — Telehealth (HOSPITAL_COMMUNITY): Payer: Self-pay

## 2023-09-27 NOTE — Telephone Encounter (Signed)
 Paperwork was turned into HF clinic folder outside of office for John Keith CPhT for the [facility] Reimbursement for his Entresto.   [name], EMT-Paramedic [phone] 09/27/2023

## 2023-09-29 ENCOUNTER — Other Ambulatory Visit (HOSPITAL_COMMUNITY): Payer: Self-pay

## 2023-09-29 NOTE — Progress Notes (Signed)
 Paramedicine Encounter    Patient ID: John Keith, male    DOB: [birth year], 73 y.o.   MRN: [id number]   Complaints- none   Assessment- CAOX4, warm and dry, lungs clear, no edema, vitals within normal range for him, denied chest pain, denied shortness of breath more than normal, denied dizziness. No weight gain.   Compliance with meds- no missed doses   Pill box filled- for one week   Refills needed- none   Meds changes since last visit- none     Social changes- none    VISIT SUMMARY- Arrived for home visit for John Keith who reports to be feeling well with no complaints. He says he has been feeling good. I obtained vitals and assessment as noted. No edema. Lungs clear. Only 1 lb weight gain- no large weight gains in the last week. I reviewed meds and confirmed same filling pill box for one week. No refills needed. We reviewed HF education with diet and exercise and continued med compliance. We reviewed appointments. I scheduled follow up with [name] May 8th at 10:40. Home visit complete. I will see him in one week.   There were no vitals taken for this visit. Weight yesterday-- 236.8lbs  Last visit weight-- 235lbs     ACTION: Home visit completed     Patient Care Team: Dana Allan, MD as PCP - General (Family Medicine) Regan Lemming, MD as PCP - Electrophysiology (Cardiology) [name], Bevelyn Buckles, MD as PCP - Cardiology (Cardiology) Pincus Sanes, MD as Consulting Physician (Internal Medicine) Chilton Si, MD as Attending Physician (Cardiology) Regan Lemming, MD as Consulting Physician (Cardiology)  Patient Active Problem List   Diagnosis Date Noted   On anticoagulant therapy 08/29/2023   On amiodarone therapy 08/29/2023   Dermatitis 01/31/2023   Onychomycosis 01/31/2023   Statin myopathy 11/15/2022   Abnormal glucose 11/15/2022   Prostate cancer screening 11/15/2022   Chronic systolic heart failure (HCC) 12/15/2021   Chronic combined  systolic and diastolic CHF (congestive heart failure) (HCC) 12/15/2021   Nasal sore 12/15/2021   Syncope 12/07/2021   Ventricular tachycardia (HCC) 12/07/2021   Typical atrial flutter (HCC) 09/24/2021   Secondary hypercoagulable state (HCC) 09/24/2021   Abnormal x-ray of neck 06/16/2021   Abnormal MRI, lumbar spine 10/23/2020   Chronic low back pain 10/23/2020   Neuropathy 10/23/2020   Coronary artery disease of native artery of native heart with stable angina pectoris (HCC) 10/23/2020   Impingement syndrome of left shoulder region 08/22/2020   Pain in joint of left shoulder 07/24/2020   Chronic left shoulder pain 07/11/2020   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   Anemia 12/13/2019   Chest pain of uncertain etiology 10/24/2019   PVC (premature ventricular contraction) 10/24/2019   Gastric ulcer without hemorrhage or perforation 09/28/2019   COVID-19 virus detected 07/12/2019   Elevated troponin 07/07/2019   Elevated serum protein level 07/07/2019   Postherpetic neuralgia 08/17/2018   Benign prostatic hyperplasia 08/01/2018   Hip pain 06/29/2018   Aortic atherosclerosis (HCC) 09/22/2016   Fatigue 06/03/2016   Lower urinary tract symptoms (LUTS) 06/03/2016   Constipation 03/10/2016   Lumbar radiculopathy 02/20/2016   Encounter for immunization 11/26/2015   Insomnia 06/10/2015   External hemorrhoid 03/07/2015   Health care maintenance 01/09/2014   Carpal tunnel syndrome of left wrist 01/03/2013   Hyperlipidemia 11/09/2012   Vitamin D deficiency 11/09/2012   Essential hypertension 11/06/2012   Tobacco abuse 11/06/2012   Seasonal allergies 11/06/2012   Erectile dysfunction 11/06/2012  GERD (gastroesophageal reflux disease) 11/06/2012   DDD (degenerative disc disease), thoracolumbar 11/06/2012   Lactose intolerance 11/06/2012   Diverticulosis of colon without hemorrhage 11/06/2012   Diverticulosis of colon 11/06/2012   Degeneration of thoracolumbar intervertebral disc 11/06/2012    Gastroesophageal reflux disease 11/06/2012   Tobacco user 11/06/2012    Current Outpatient Medications:    amiodarone (PACERONE) 200 MG tablet, Take 0.5 tablets (100 mg total) by mouth daily., Disp: 90 tablet, Rfl: 1   apixaban (ELIQUIS) 5 MG TABS tablet, Take 1 tablet (5 mg total) by mouth 2 (two) times daily., Disp: 180 tablet, Rfl: 3   calcium carbonate (OS-CAL - DOSED IN MG OF ELEMENTAL CALCIUM) 1250 (500 Ca) MG tablet, Take 1 tablet (500 mg of elemental calcium total) by mouth daily with breakfast., Disp: 30 tablet, Rfl: 11   carvedilol (COREG) 6.25 MG tablet, Take 1 tablet (6.25 mg total) by mouth 2 (two) times daily with a meal., Disp: 180 tablet, Rfl: 3   Cholecalciferol (VITAMIN D-3) 125 MCG (5000 UT) TABS, Take 1 tablet by mouth daily., Disp: 30 tablet, Rfl: 11   ezetimibe (ZETIA) 10 MG tablet, Take 1 tablet (10 mg total) by mouth daily., Disp: 90 tablet, Rfl: 3   FARXIGA 10 MG TABS tablet, Take 1 tablet (10 mg total) by mouth daily., Disp: 90 tablet, Rfl: 3   ferrous sulfate 325 (65 FE) MG tablet, Take 325 mg by mouth daily with breakfast., Disp: , Rfl:    folic acid (FOLVITE) 1 MG tablet, TAKE 1 TABLET(1 MG) BY MOUTH DAILY, Disp: 30 tablet, Rfl: 11   gabapentin (NEURONTIN) 300 MG capsule, Take 600 mg by mouth at bedtime., Disp: , Rfl:    methotrexate (RHEUMATREX) 2.5 MG tablet, TAKE 20MG (8 TABLETS) EVERY WEDNESDAY. PROTECT FROM LIGHT AS DIRECTED, Disp: 150 tablet, Rfl: 0   Multiple Vitamin (MULTIVITAMIN) tablet, Take 1 tablet by mouth daily., Disp: , Rfl:    mupirocin ointment (BACTROBAN) 2 %, Apply 1 Application topically daily., Disp: 22 g, Rfl: 3   pantoprazole (PROTONIX) 40 MG tablet, Take 1 tablet (40 mg total) by mouth daily. 30 min before food, Disp: 30 tablet, Rfl: 6   sacubitril-valsartan (ENTRESTO) 24-26 MG, Take 1 tablet by mouth 2 (two) times daily., Disp: 180 tablet, Rfl: 3   spironolactone (ALDACTONE) 25 MG tablet, Take 1 tablet (25 mg total) by mouth daily., Disp: 90  tablet, Rfl: 3 Allergies  Allergen Reactions   Crestor [Rosuvastatin Calcium] Other (See Comments)    Elevated muscle enzymes    Lipitor [Atorvastatin Calcium] Other (See Comments)    Elevated muscle enzymes      Social History   Socioeconomic History   Marital status: Married    Spouse name: Gwendolyn   Number of children: 2   Years of education: Not on file   Highest education level: High school graduate  Occupational History   Occupation: Retired    Comment: Semi  Tobacco Use   Smoking status: Former    Current packs/day: 0.00    Average packs/day: 0.5 packs/day for 49.0 years (24.5 ttl pk-yrs)    Types: Cigarettes    Start date: 07/06/1971    Quit date: 06/26/2020    Years since quitting: 3.2   Smokeless tobacco: Never   Tobacco comments:    Former smoker 09/24/21  Vaping Use   Vaping status: Never Used  Substance and Sexual Activity   Alcohol use: No    Alcohol/week: 0.0 standard drinks of alcohol   Drug use: No  Sexual activity: Not on file  Other Topics Concern   Not on file  Social History Narrative   Married    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    On child    1 son, 1 daughter   Machine op   Owns guns    Wears seat belt, safe in relationship    Smoker    Retired 06/27/2019   Social Drivers of Health   Financial Resource Strain: Low Risk  (11/22/2022)   Overall Financial Resource Strain (CARDIA)    Difficulty of Paying Living Expenses: Not hard at all  Food Insecurity: No Food Insecurity (11/22/2022)   Hunger Vital Sign    Worried About Running Out of Food in the Last Year: Never true    Ran Out of Food in the Last Year: Never true  Transportation Needs: No Transportation Needs (11/22/2022)   PRAPARE - Administrator, Civil Service (Medical): No    Lack of Transportation (Non-Medical): No  Physical Activity: Sufficiently Active (11/22/2022)   Exercise Vital Sign    Days of Exercise per Week: 3 days    Minutes of Exercise per Session: 60 min  Stress: No  Stress Concern Present (11/22/2022)   [employer] of Occupational Health - Occupational Stress Questionnaire    Feeling of Stress : Not at all  Social Connections: Socially Integrated (11/22/2022)   Social Connection and Isolation Panel [NHANES]    Frequency of Communication with Friends and Family: Once a week    Frequency of Social Gatherings with Friends and Family: More than three times a week    Attends Religious Services: More than 4 times per year    Active Member of [employer] or Organizations: Yes    Attends Banker Meetings: Never    Marital Status: Married  Catering manager Violence: Not At Risk (11/22/2022)   Humiliation, Afraid, Rape, and Kick questionnaire    Fear of Current or Ex-Partner: No    Emotionally Abused: No    Physically Abused: No    Sexually Abused: No    Physical Exam      Future Appointments  Date Time Provider Department Center  11/23/2023  8:10 AM [facility] ANNUAL WELLNESS VISIT [facility]  11/24/2023  9:15 AM McCaughan, Dia D, DPM [facility]  12/09/2023  7:00 AM [facility] DEVICE REMOTES [facility]  02/27/2024  8:40 AM Dana Allan, MD [facility]  03/09/2024  7:00 AM [facility] DEVICE [facility]OTES [facility]  06/08/2024  7:00 AM [facility]ES [facility]

## 2023-09-30 ENCOUNTER — Telehealth: Payer: Self-pay | Admitting: Family Medicine

## 2023-09-30 NOTE — Telephone Encounter (Signed)
 Dr Clent Ridges is leaving the practice and your appointment on 02/27/24 has been rescheduled for the same day and time with Dr. Heide Spark. Also you will need to schedule a transfer of care appointment with another provider at check-out that day to continue care at this office. Our providers taking Transfer of Care patients are, Dr Skip Estimable, MD, Bethanie Dicker, NP or Kara Dies, NP.   Thank you  Called pt, no answer and no MyChart. If they call back, please let them know their 8/25 visit was rescheduled with Dr Heide Spark, but they will still need to schedule a TOC visit. [facility]

## 2023-10-03 ENCOUNTER — Other Ambulatory Visit (HOSPITAL_COMMUNITY): Payer: Self-pay | Admitting: Cardiology

## 2023-10-03 MED ORDER — METHOTREXATE SODIUM 2.5 MG PO TABS: ORAL_TABLET | ORAL | 0 refills | Status: AC

## 2023-10-06 ENCOUNTER — Other Ambulatory Visit (HOSPITAL_COMMUNITY): Payer: Self-pay

## 2023-10-06 NOTE — Progress Notes (Signed)
 Paramedicine Encounter    Patient ID: John Keith, male    DOB: [birth year], 73 y.o.   MRN: [id number]   Complaints- seasonal allergies   Assessment- CAOX4, warm and dry seated at his kitchen table reporting to be having some seasonal allergy symptoms with itchy eyes, nasal congestion. He reports he is now taking a daily cetrizine to help.   Compliance with meds- missed one evening dose (Friday)  Pill box filled- for one week   Refills needed- methotrexate   Meds changes since last visit- none     Social changes- none    VISIT SUMMARY- Arrived for home visit for John Keith who reports to be feeling okay but suffering from seasonal allergies- he reports he started a daily cetrizine two days ago to help. He denied any recent shortness of breath, chest pain, dizziness. I obtained vitals and assessment as noted. Lungs clear, no edema noted. No weight gain. Meds reviewed- he missed one evening dose, we discussed same. I filled pill box for one week. Appointments reviewed. No other needs at present. I will see John Keith in one week. Home visit complete.   BP (!) 98/58   Pulse 65   Resp 16   Wt 236 lb (107 kg)   SpO2 96%   BMI 25.29 kg/m  Weight yesterday-- 237lbs  Last visit weight--236lbs    [name], EMT-Paramedic [phone] 10/06/2023   ACTION: Home visit completed     Patient Care Team: Dana Allan, MD as PCP - General (Family Medicine) Regan Lemming, MD as PCP - Electrophysiology (Cardiology) [name], Bevelyn Buckles, MD as PCP - Cardiology (Cardiology) Pincus Sanes, MD as Consulting Physician (Internal Medicine) Chilton Si, MD as Attending Physician (Cardiology) Regan Lemming, MD as Consulting Physician (Cardiology)  Patient Active Problem List   Diagnosis Date Noted   On anticoagulant therapy 08/29/2023   On amiodarone therapy 08/29/2023   Dermatitis 01/31/2023   Onychomycosis 01/31/2023   Statin myopathy 11/15/2022   Abnormal glucose  11/15/2022   Prostate cancer screening 11/15/2022   Chronic systolic heart failure (HCC) 12/15/2021   Chronic combined systolic and diastolic CHF (congestive heart failure) (HCC) 12/15/2021   Nasal sore 12/15/2021   Syncope 12/07/2021   Ventricular tachycardia (HCC) 12/07/2021   Typical atrial flutter (HCC) 09/24/2021   Secondary hypercoagulable state (HCC) 09/24/2021   Abnormal x-ray of neck 06/16/2021   Abnormal MRI, lumbar spine 10/23/2020   Chronic low back pain 10/23/2020   Neuropathy 10/23/2020   Coronary artery disease of native artery of native heart with stable angina pectoris (HCC) 10/23/2020   Impingement syndrome of left shoulder region 08/22/2020   Pain in joint of left shoulder 07/24/2020   Chronic left shoulder pain 07/11/2020   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   Anemia 12/13/2019   Chest pain of uncertain etiology 10/24/2019   PVC (premature ventricular contraction) 10/24/2019   Gastric ulcer without hemorrhage or perforation 09/28/2019   COVID-19 virus detected 07/12/2019   Elevated troponin 07/07/2019   Elevated serum protein level 07/07/2019   Postherpetic neuralgia 08/17/2018   Benign prostatic hyperplasia 08/01/2018   Hip pain 06/29/2018   Aortic atherosclerosis (HCC) 09/22/2016   Fatigue 06/03/2016   Lower urinary tract symptoms (LUTS) 06/03/2016   Constipation 03/10/2016   Lumbar radiculopathy 02/20/2016   Encounter for immunization 11/26/2015   Insomnia 06/10/2015   External hemorrhoid 03/07/2015   Health care maintenance 01/09/2014   Carpal tunnel syndrome of left wrist 01/03/2013   Hyperlipidemia 11/09/2012   Vitamin D deficiency 11/09/2012  Essential hypertension 11/06/2012   Tobacco abuse 11/06/2012   Seasonal allergies 11/06/2012   Erectile dysfunction 11/06/2012   GERD (gastroesophageal reflux disease) 11/06/2012   DDD (degenerative disc disease), thoracolumbar 11/06/2012   Lactose intolerance 11/06/2012   Diverticulosis of colon without  hemorrhage 11/06/2012   Diverticulosis of colon 11/06/2012   Degeneration of thoracolumbar intervertebral disc 11/06/2012   Gastroesophageal reflux disease 11/06/2012   Tobacco user 11/06/2012    Current Outpatient Medications:    amiodarone (PACERONE) 200 MG tablet, Take 0.5 tablets (100 mg total) by mouth daily., Disp: 90 tablet, Rfl: 1   apixaban (ELIQUIS) 5 MG TABS tablet, Take 1 tablet (5 mg total) by mouth 2 (two) times daily., Disp: 180 tablet, Rfl: 3   calcium carbonate (OS-CAL - DOSED IN MG OF ELEMENTAL CALCIUM) 1250 (500 Ca) MG tablet, Take 1 tablet (500 mg of elemental calcium total) by mouth daily with breakfast., Disp: 30 tablet, Rfl: 11   carvedilol (COREG) 6.25 MG tablet, Take 1 tablet (6.25 mg total) by mouth 2 (two) times daily with a meal., Disp: 180 tablet, Rfl: 3   Cholecalciferol (VITAMIN D-3) 125 MCG (5000 UT) TABS, Take 1 tablet by mouth daily., Disp: 30 tablet, Rfl: 11   ezetimibe (ZETIA) 10 MG tablet, Take 1 tablet (10 mg total) by mouth daily., Disp: 90 tablet, Rfl: 3   FARXIGA 10 MG TABS tablet, Take 1 tablet (10 mg total) by mouth daily., Disp: 90 tablet, Rfl: 3   ferrous sulfate 325 (65 FE) MG tablet, Take 325 mg by mouth daily with breakfast., Disp: , Rfl:    folic acid (FOLVITE) 1 MG tablet, TAKE 1 TABLET(1 MG) BY MOUTH DAILY, Disp: 30 tablet, Rfl: 11   gabapentin (NEURONTIN) 300 MG capsule, Take 600 mg by mouth at bedtime., Disp: , Rfl:    methotrexate (RHEUMATREX) 2.5 MG tablet, TAKE 20MG (8 TABLETS) EVERY WEDNESDAY. PROTECT FROM LIGHT AS DIRECTED, Disp: 150 tablet, Rfl: 0   Multiple Vitamin (MULTIVITAMIN) tablet, Take 1 tablet by mouth daily., Disp: , Rfl:    mupirocin ointment (BACTROBAN) 2 %, Apply 1 Application topically daily., Disp: 22 g, Rfl: 3   pantoprazole (PROTONIX) 40 MG tablet, Take 1 tablet (40 mg total) by mouth daily. 30 min before food, Disp: 30 tablet, Rfl: 6   sacubitril-valsartan (ENTRESTO) 24-26 MG, Take 1 tablet by mouth 2 (two) times  daily., Disp: 180 tablet, Rfl: 3   spironolactone (ALDACTONE) 25 MG tablet, Take 1 tablet (25 mg total) by mouth daily., Disp: 90 tablet, Rfl: 3 Allergies  Allergen Reactions   Crestor [Rosuvastatin Calcium] Other (See Comments)    Elevated muscle enzymes    Lipitor [Atorvastatin Calcium] Other (See Comments)    Elevated muscle enzymes      Social History   Socioeconomic History   Marital status: Married    Spouse name: Gwendolyn   Number of children: 2   Years of education: Not on file   Highest education level: High school graduate  Occupational History   Occupation: Retired    Comment: Semi  Tobacco Use   Smoking status: Former    Current packs/day: 0.00    Average packs/day: 0.5 packs/day for 49.0 years (24.5 ttl pk-yrs)    Types: Cigarettes    Start date: 07/06/1971    Quit date: 06/26/2020    Years since quitting: 3.2   Smokeless tobacco: Never   Tobacco comments:    Former smoker 09/24/21  Vaping Use   Vaping status: Never Used  Substance and Sexual  Activity   Alcohol use: No    Alcohol/week: 0.0 standard drinks of alcohol   Drug use: No   Sexual activity: Not on file  Other Topics Concern   Not on file  Social History Narrative   Married    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    On child    1 son, 1 daughter   Machine op   Owns guns    Wears seat belt, safe in relationship    Smoker    Retired 06/27/2019   Social Drivers of Health   Financial Resource Strain: Low Risk  (11/22/2022)   Overall Financial Resource Strain (CARDIA)    Difficulty of Paying Living Expenses: Not hard at all  Food Insecurity: No Food Insecurity (11/22/2022)   Hunger Vital Sign    Worried About Running Out of Food in the Last Year: Never true    Ran Out of Food in the Last Year: Never true  Transportation Needs: No Transportation Needs (11/22/2022)   PRAPARE - Administrator, Civil Service (Medical): No    Lack of Transportation (Non-Medical): No  Physical Activity: Sufficiently  Active (11/22/2022)   Exercise Vital Sign    Days of Exercise per Week: 3 days    Minutes of Exercise per Session: 60 min  Stress: No Stress Concern Present (11/22/2022)   [employer] of Occupational Health - Occupational Stress Questionnaire    Feeling of Stress : Not at all  Social Connections: Socially Integrated (11/22/2022)   Social Connection and Isolation Panel [NHANES]    Frequency of Communication with Friends and Family: Once a week    Frequency of Social Gatherings with Friends and Family: More than three times a week    Attends Religious Services: More than 4 times per year    Active Member of [employer] or Organizations: Yes    Attends Banker Meetings: Never    Marital Status: Married  Catering manager Violence: Not At Risk (11/22/2022)   Humiliation, Afraid, Rape, and Kick questionnaire    Fear of Current or Ex-Partner: No    Emotionally Abused: No    Physically Abused: No    Sexually Abused: No    Physical Exam      Future Appointments  Date Time Provider Department Center  11/10/2023 10:40 AM [name], Bevelyn Buckles, MD [facility] None  11/23/2023  8:10 AM [facility] ANNUAL WELLNESS VISIT [facility]  11/24/2023  9:15 AM McCaughan, Dia D, DPM [facility]  12/09/2023  7:00 AM [facility] DEVICE REMOTES [facility]  02/27/2024  8:40 AM Earl Lagos, MD [facility]  03/09/2024  7:00 AM [facility] DEVICE REMOTES [facility]  06/08/2024  7:00 AM [facility] DEVICE REMOTES [facility]

## 2023-10-11 NOTE — Addendum Note (Signed)
 Addended by: Elease Etienne A on: 10/11/2023 03:15 PM   Modules accepted: Orders

## 2023-10-11 NOTE — Progress Notes (Signed)
 Remote ICD transmission.

## 2023-10-12 ENCOUNTER — Telehealth (HOSPITAL_COMMUNITY): Payer: Self-pay

## 2023-10-12 NOTE — Telephone Encounter (Signed)
 Reached out to Dr. Shawnee Knapp office with [facility] for a refill request on Mr. Schmid's Methotrexate as Dr. Dierdre Forth is now managing same. I had to LVM with his nurse for this request. I will continue to follow up.    [name], EMT-Paramedic [phone] 10/12/2023

## 2023-10-13 ENCOUNTER — Other Ambulatory Visit (HOSPITAL_COMMUNITY): Payer: Self-pay

## 2023-10-13 NOTE — Progress Notes (Signed)
 Paramedicine Encounter    Patient ID: John Keith, male    DOB: [birth year], 73 y.o.   MRN: [id number]   Complaints- no complaints   Assessment- CAOX4, warm and dry reporting to be feeling good with no complaints. Lungs clear with no shortness of breath.   Compliance with meds- no missed meds   Pill box filled- two weeks   Refills needed-  -spironolactone -carvedilol   Meds changes since last visit- none     Social changes- none    VISIT SUMMARY- Arrived for home visit for [name] who reports to be feeling okay with no complaints. Assessment and vitals obtained. Lungs clear, no lower leg edema. Meds reviewed and pill box filled. We reviewed upcoming appointments and confirmed. Home visit planned for two weeks. Home visit complete.   BP 118/70   Pulse 68   Resp 16   Wt 238 lb (108 kg)   SpO2 96%   BMI 25.50 kg/m  Weight yesterday-- 238lbs Last visit weight-- 236lbs     ACTION: Home visit completed     Patient Care Team: Dana Allan, MD as PCP - General (Family Medicine) Regan Lemming, MD as PCP - Electrophysiology (Cardiology) [name], Bevelyn Buckles, MD as PCP - Cardiology (Cardiology) Pincus Sanes, MD as Consulting Physician (Internal Medicine) Chilton Si, MD as Attending Physician (Cardiology) Regan Lemming, MD as Consulting Physician (Cardiology)  Patient Active Problem List   Diagnosis Date Noted   On anticoagulant therapy 08/29/2023   On amiodarone therapy 08/29/2023   Dermatitis 01/31/2023   Onychomycosis 01/31/2023   Statin myopathy 11/15/2022   Abnormal glucose 11/15/2022   Prostate cancer screening 11/15/2022   Chronic systolic heart failure (HCC) 12/15/2021   Chronic combined systolic and diastolic CHF (congestive heart failure) (HCC) 12/15/2021   Nasal sore 12/15/2021   Syncope 12/07/2021   Ventricular tachycardia (HCC) 12/07/2021   Typical atrial flutter (HCC) 09/24/2021   Secondary hypercoagulable state (HCC)  09/24/2021   Abnormal x-ray of neck 06/16/2021   Abnormal MRI, lumbar spine 10/23/2020   Chronic low back pain 10/23/2020   Neuropathy 10/23/2020   Coronary artery disease of native artery of native heart with stable angina pectoris (HCC) 10/23/2020   Impingement syndrome of left shoulder region 08/22/2020   Pain in joint of left shoulder 07/24/2020   Chronic left shoulder pain 07/11/2020   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   Anemia 12/13/2019   Chest pain of uncertain etiology 10/24/2019   PVC (premature ventricular contraction) 10/24/2019   Gastric ulcer without hemorrhage or perforation 09/28/2019   COVID-19 virus detected 07/12/2019   Elevated troponin 07/07/2019   Elevated serum protein level 07/07/2019   Postherpetic neuralgia 08/17/2018   Benign prostatic hyperplasia 08/01/2018   Hip pain 06/29/2018   Aortic atherosclerosis (HCC) 09/22/2016   Fatigue 06/03/2016   Lower urinary tract symptoms (LUTS) 06/03/2016   Constipation 03/10/2016   Lumbar radiculopathy 02/20/2016   Encounter for immunization 11/26/2015   Insomnia 06/10/2015   External hemorrhoid 03/07/2015   Health care maintenance 01/09/2014   Carpal tunnel syndrome of left wrist 01/03/2013   Hyperlipidemia 11/09/2012   Vitamin D deficiency 11/09/2012   Essential hypertension 11/06/2012   Tobacco abuse 11/06/2012   Seasonal allergies 11/06/2012   Erectile dysfunction 11/06/2012   GERD (gastroesophageal reflux disease) 11/06/2012   DDD (degenerative disc disease), thoracolumbar 11/06/2012   Lactose intolerance 11/06/2012   Diverticulosis of colon without hemorrhage 11/06/2012   Diverticulosis of colon 11/06/2012   Degeneration of thoracolumbar intervertebral disc 11/06/2012  Gastroesophageal reflux disease 11/06/2012   Tobacco user 11/06/2012    Current Outpatient Medications:    amiodarone (PACERONE) 200 MG tablet, Take 0.5 tablets (100 mg total) by mouth daily., Disp: 90 tablet, Rfl: 1   apixaban  (ELIQUIS) 5 MG TABS tablet, Take 1 tablet (5 mg total) by mouth 2 (two) times daily., Disp: 180 tablet, Rfl: 3   calcium carbonate (OS-CAL - DOSED IN MG OF ELEMENTAL CALCIUM) 1250 (500 Ca) MG tablet, Take 1 tablet (500 mg of elemental calcium total) by mouth daily with breakfast., Disp: 30 tablet, Rfl: 11   carvedilol (COREG) 6.25 MG tablet, Take 1 tablet (6.25 mg total) by mouth 2 (two) times daily with a meal., Disp: 180 tablet, Rfl: 3   Cholecalciferol (VITAMIN D-3) 125 MCG (5000 UT) TABS, Take 1 tablet by mouth daily., Disp: 30 tablet, Rfl: 11   ezetimibe (ZETIA) 10 MG tablet, Take 1 tablet (10 mg total) by mouth daily., Disp: 90 tablet, Rfl: 3   FARXIGA 10 MG TABS tablet, Take 1 tablet (10 mg total) by mouth daily., Disp: 90 tablet, Rfl: 3   ferrous sulfate 325 (65 FE) MG tablet, Take 325 mg by mouth daily with breakfast., Disp: , Rfl:    folic acid (FOLVITE) 1 MG tablet, TAKE 1 TABLET(1 MG) BY MOUTH DAILY, Disp: 30 tablet, Rfl: 11   gabapentin (NEURONTIN) 300 MG capsule, Take 600 mg by mouth at bedtime., Disp: , Rfl:    methotrexate (RHEUMATREX) 2.5 MG tablet, TAKE 20MG (8 TABLETS) EVERY WEDNESDAY. PROTECT FROM LIGHT AS DIRECTED, Disp: 150 tablet, Rfl: 0   Multiple Vitamin (MULTIVITAMIN) tablet, Take 1 tablet by mouth daily., Disp: , Rfl:    mupirocin ointment (BACTROBAN) 2 %, Apply 1 Application topically daily., Disp: 22 g, Rfl: 3   pantoprazole (PROTONIX) 40 MG tablet, Take 1 tablet (40 mg total) by mouth daily. 30 min before food, Disp: 30 tablet, Rfl: 6   sacubitril-valsartan (ENTRESTO) 24-26 MG, Take 1 tablet by mouth 2 (two) times daily., Disp: 180 tablet, Rfl: 3   spironolactone (ALDACTONE) 25 MG tablet, Take 1 tablet (25 mg total) by mouth daily., Disp: 90 tablet, Rfl: 3 Allergies  Allergen Reactions   Crestor [Rosuvastatin Calcium] Other (See Comments)    Elevated muscle enzymes    Lipitor [Atorvastatin Calcium] Other (See Comments)    Elevated muscle enzymes      Social  History   Socioeconomic History   Marital status: Married    Spouse name: Gwendolyn   Number of children: 2   Years of education: Not on file   Highest education level: High school graduate  Occupational History   Occupation: Retired    Comment: Semi  Tobacco Use   Smoking status: Former    Current packs/day: 0.00    Average packs/day: 0.5 packs/day for 49.0 years (24.5 ttl pk-yrs)    Types: Cigarettes    Start date: 07/06/1971    Quit date: 06/26/2020    Years since quitting: 3.2   Smokeless tobacco: Never   Tobacco comments:    Former smoker 09/24/21  Vaping Use   Vaping status: Never Used  Substance and Sexual Activity   Alcohol use: No    Alcohol/week: 0.0 standard drinks of alcohol   Drug use: No   Sexual activity: Not on file  Other Topics Concern   Not on file  Social History Narrative   Married    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    On child    1 son, 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    Wears seat belt, safe in relationship    Smoker    Retired 06/27/2019   Social Drivers of [employer]: Low Risk  (11/22/2022)   Overall Financial Resource Strain (CARDIA)    Difficulty of Paying Living Expenses: Not hard at all  Food Insecurity: No Food Insecurity (11/22/2022)   Hunger Vital Sign    Worried About Running Out of Food in the Last Year: Never true    Ran Out of Food in the Last Year: Never true  Transportation Needs: No Transportation Needs (11/22/2022)   PRAPARE - Administrator, Civil Service (Medical): No    Lack of Transportation (Non-Medical): No  Physical Activity: Sufficiently Active (11/22/2022)   Exercise Vital Sign    Days of Exercise per Week: 3 days    Minutes of Exercise per Session: 60 min  Stress: No Stress Concern Present (11/22/2022)   [employer] of Occupational Health - Occupational Stress Questionnaire    Feeling of Stress : Not at all  Social Connections: Socially Integrated (11/22/2022)   Social Connection and  Isolation Panel [NHANES]    Frequency of Communication with Friends and Family: Once a week    Frequency of Social Gatherings with Friends and Family: More than three times a week    Attends Religious Services: More than 4 times per year    Active Member of [employer] or Organizations: Yes    Attends Banker Meetings: Never    Marital Status: Married  Catering manager Violence: Not At Risk (11/22/2022)   Humiliation, Afraid, Rape, and Kick questionnaire    Fear of Current or Ex-Partner: No    Emotionally Abused: No    Physically Abused: No    Sexually Abused: No    Physical Exam      Future Appointments  Date Time Provider Department Center  11/10/2023 10:40 AM [name], Bevelyn Buckles, MD [facility] None  11/23/2023  8:10 AM [facility] ANNUAL WELLNESS VISIT [facility]  11/24/2023  9:15 AM McCaughan, Dia D, DPM [facility]  12/09/2023  7:00 AM [facility] DEVICE REMOTES [facility]  02/27/2024  8:40 AM Earl Lagos, MD [facility]  03/09/2024  7:00 AM [facility] DEVICE REMOTES [facility]  06/08/2024  7:00 AM [facility] DEVICE REMOTES [facility]

## 2023-10-19 ENCOUNTER — Other Ambulatory Visit: Payer: Self-pay | Admitting: Family Medicine

## 2023-10-25 ENCOUNTER — Other Ambulatory Visit (HOSPITAL_COMMUNITY): Payer: Self-pay

## 2023-10-25 DIAGNOSIS — I5022 Chronic systolic (congestive) heart failure: Secondary | ICD-10-CM

## 2023-10-25 MED ORDER — SPIRONOLACTONE 25 MG PO TABS: 25.0000 mg | ORAL_TABLET | Freq: Every day | ORAL | 3 refills | Status: DC

## 2023-10-27 ENCOUNTER — Other Ambulatory Visit (HOSPITAL_COMMUNITY): Payer: Self-pay

## 2023-10-27 ENCOUNTER — Telehealth (HOSPITAL_COMMUNITY): Payer: Self-pay

## 2023-10-27 ENCOUNTER — Other Ambulatory Visit: Payer: Self-pay

## 2023-10-27 DIAGNOSIS — I5022 Chronic systolic (congestive) heart failure: Secondary | ICD-10-CM

## 2023-10-27 MED ORDER — SPIRONOLACTONE 25 MG PO TABS
25.0000 mg | ORAL_TABLET | Freq: Every day | ORAL | 3 refills | Status: AC
  Filled 2023-10-27 – 2024-01-17 (×2): qty 90, 90d supply, fill #0

## 2023-10-27 NOTE — Addendum Note (Signed)
 Addended by: Glorietta Lark on: 10/27/2023 12:45 PM   Modules accepted: Orders

## 2023-10-27 NOTE — Telephone Encounter (Signed)
 HF Paramedicine Message to [facility]  Pharmacy (if applicable): [facility]   Issue/reason for call: Needs spironolactone  resent to [facility], EMT-Paramedic [phone] 10/27/2023

## 2023-10-27 NOTE — Telephone Encounter (Signed)
 Rx sent

## 2023-10-27 NOTE — Progress Notes (Signed)
 Paramedicine Encounter    Patient ID: John Keith, male    DOB: 08-Oct-1950, 73 y.o.   MRN: [id number]   Complaints- none   Assessment- CAOX4, warm and dry seated in kitchen reporting to be feeling well with no complaints. Lungs clear, no lower leg edema. Vitals within normal limits.   Compliance with meds- took all meds   Pill box filled- for one week   Refills needed- folic acid , ointment   Meds changes since last visit- none     Social changes- none    VISIT SUMMARY- Arrived for home visit for John Keith who reports to be feeling well with no complaints. He denied any chest pain, dizziness, shortness of breath. I obtained vitals as noted. Lungs clear. No lower leg swelling. I reviewed meds and filled pill box for one week. Refills as noted called in. We need to check status on Eliquis  BMS application. He is still waiting on check for reimbursement for Entresto . Appointments reviewed. Home visit complete. I will see John Keith in one week.   BP 110/62   Pulse 63   Resp 16   Wt 237 lb 3.2 oz (107.6 kg)   SpO2 95%   BMI 25.42 kg/m  Weight yesterday-- 236lbs  Last visit weight-- 238lbs      ACTION: Home visit completed     Patient Care Team: Valli Gaw, MD as PCP - General (Family Medicine) Lei Pump, MD as PCP - Electrophysiology (Cardiology) [name], Rheta Celestine, MD as PCP - Cardiology (Cardiology) Colene Dauphin, MD as Consulting Physician (Internal Medicine) Maudine Sos, MD as Attending Physician (Cardiology) Lei Pump, MD as Consulting Physician (Cardiology)  Patient Active Problem List   Diagnosis Date Noted   On anticoagulant therapy 08/29/2023   On amiodarone  therapy 08/29/2023   Dermatitis 01/31/2023   Onychomycosis 01/31/2023   Statin myopathy 11/15/2022   Abnormal glucose 11/15/2022   Prostate cancer screening 11/15/2022   Chronic systolic heart failure (HCC) 12/15/2021   Chronic combined systolic and diastolic CHF (congestive  heart failure) (HCC) 12/15/2021   Nasal sore 12/15/2021   Syncope 12/07/2021   Ventricular tachycardia (HCC) 12/07/2021   Typical atrial flutter (HCC) 09/24/2021   Secondary hypercoagulable state (HCC) 09/24/2021   Abnormal x-ray of neck 06/16/2021   Abnormal MRI, lumbar spine 10/23/2020   Chronic low back pain 10/23/2020   Neuropathy 10/23/2020   Coronary artery disease of native artery of native heart with stable angina pectoris (HCC) 10/23/2020   Impingement syndrome of left shoulder region 08/22/2020   Pain in joint of left shoulder 07/24/2020   Chronic left shoulder pain 07/11/2020   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   Anemia 12/13/2019   Chest pain of uncertain etiology 10/24/2019   PVC (premature ventricular contraction) 10/24/2019   Gastric ulcer without hemorrhage or perforation 09/28/2019   COVID-19 virus detected 07/12/2019   Elevated troponin 07/07/2019   Elevated serum protein level 07/07/2019   Postherpetic neuralgia 08/17/2018   Benign prostatic hyperplasia 08/01/2018   Hip pain 06/29/2018   Aortic atherosclerosis (HCC) 09/22/2016   Fatigue 06/03/2016   Lower urinary tract symptoms (LUTS) 06/03/2016   Constipation 03/10/2016   Lumbar radiculopathy 02/20/2016   Encounter for immunization 11/26/2015   Insomnia 06/10/2015   External hemorrhoid 03/07/2015   Health care maintenance 01/09/2014   Carpal tunnel syndrome of left wrist 01/03/2013   Hyperlipidemia 11/09/2012   Vitamin D  deficiency 11/09/2012   Essential hypertension 11/06/2012   Tobacco abuse 11/06/2012   Seasonal allergies 11/06/2012   Erectile  dysfunction 11/06/2012   GERD (gastroesophageal reflux disease) 11/06/2012   DDD (degenerative disc disease), thoracolumbar 11/06/2012   Lactose intolerance 11/06/2012   Diverticulosis of colon without hemorrhage 11/06/2012   Diverticulosis of colon 11/06/2012   Degeneration of thoracolumbar intervertebral disc 11/06/2012   Gastroesophageal reflux disease  11/06/2012   Tobacco user 11/06/2012    Current Outpatient Medications:    amiodarone  (PACERONE ) 200 MG tablet, Take 0.5 tablets (100 mg total) by mouth daily., Disp: 90 tablet, Rfl: 1   apixaban  (ELIQUIS ) 5 MG TABS tablet, Take 1 tablet (5 mg total) by mouth 2 (two) times daily., Disp: 180 tablet, Rfl: 3   calcium  carbonate (OS-CAL - DOSED IN MG OF ELEMENTAL CALCIUM ) 1250 (500 Ca) MG tablet, Take 1 tablet (500 mg of elemental calcium  total) by mouth daily with breakfast., Disp: 30 tablet, Rfl: 11   carvedilol  (COREG ) 6.25 MG tablet, Take 1 tablet (6.25 mg total) by mouth 2 (two) times daily with a meal., Disp: 180 tablet, Rfl: 3   Cholecalciferol  (VITAMIN D -3) 125 MCG (5000 UT) TABS, Take 1 tablet by mouth daily., Disp: 30 tablet, Rfl: 11   ezetimibe  (ZETIA ) 10 MG tablet, Take 1 tablet (10 mg total) by mouth daily., Disp: 90 tablet, Rfl: 3   FARXIGA  10 MG TABS tablet, Take 1 tablet (10 mg total) by mouth daily., Disp: 90 tablet, Rfl: 3   ferrous sulfate  325 (65 FE) MG tablet, Take 325 mg by mouth daily with breakfast., Disp: , Rfl:    folic acid  (FOLVITE ) 1 MG tablet, TAKE 1 TABLET(1 MG) BY MOUTH DAILY, Disp: 30 tablet, Rfl: 11   gabapentin  (NEURONTIN ) 300 MG capsule, TAKE 3 CAPSULES(900 MG) BY MOUTH DAILY, Disp: 270 capsule, Rfl: 1   methotrexate  (RHEUMATREX) 2.5 MG tablet, TAKE 20MG (8 TABLETS) EVERY WEDNESDAY. PROTECT FROM LIGHT AS DIRECTED, Disp: 150 tablet, Rfl: 0   Multiple Vitamin (MULTIVITAMIN) tablet, Take 1 tablet by mouth daily., Disp: , Rfl:    mupirocin  ointment (BACTROBAN ) 2 %, Apply 1 Application topically daily., Disp: 22 g, Rfl: 3   pantoprazole  (PROTONIX ) 40 MG tablet, Take 1 tablet (40 mg total) by mouth daily. 30 min before food, Disp: 30 tablet, Rfl: 6   sacubitril -valsartan  (ENTRESTO ) 24-26 MG, Take 1 tablet by mouth 2 (two) times daily., Disp: 180 tablet, Rfl: 3   spironolactone  (ALDACTONE ) 25 MG tablet, Take 1 tablet (25 mg total) by mouth daily., Disp: 90 tablet, Rfl:  3 Allergies  Allergen Reactions   Crestor  [Rosuvastatin  Calcium ] Other (See Comments)    Elevated muscle enzymes    Lipitor [Atorvastatin  Calcium ] Other (See Comments)    Elevated muscle enzymes      Social History   Socioeconomic History   Marital status: Married    Spouse name: Gwendolyn   Number of children: 2   Years of education: Not on file   Highest education level: High school graduate  Occupational History   Occupation: Retired    Comment: Semi  Tobacco Use   Smoking status: Former    Current packs/day: 0.00    Average packs/day: 0.5 packs/day for 49.0 years (24.5 ttl pk-yrs)    Types: Cigarettes    Start date: 07/06/1971    Quit date: 06/26/2020    Years since quitting: 3.3   Smokeless tobacco: Never   Tobacco comments:    Former smoker 09/24/21  Vaping Use   Vaping status: Never Used  Substance and Sexual Activity   Alcohol use: No    Alcohol/week: 0.0 standard drinks of alcohol  Drug use: No   Sexual activity: Not on file  Other Topics Concern   Not on file  Social History Narrative   Married    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    On child    1 son, 1 daughter   Machine op   Owns guns    Wears seat belt, safe in relationship    Smoker    Retired 06/27/2019   Social Drivers of Health   Financial Resource Strain: Low Risk  (11/22/2022)   Overall Financial Resource Strain (CARDIA)    Difficulty of Paying Living Expenses: Not hard at all  Food Insecurity: No Food Insecurity (11/22/2022)   Hunger Vital Sign    Worried About Running Out of Food in the Last Year: Never true    Ran Out of Food in the Last Year: Never true  Transportation Needs: No Transportation Needs (11/22/2022)   PRAPARE - Administrator, Civil Service (Medical): No    Lack of Transportation (Non-Medical): No  Physical Activity: Sufficiently Active (11/22/2022)   Exercise Vital Sign    Days of Exercise per Week: 3 days    Minutes of Exercise per Session: 60 min  Stress: No Stress  Concern Present (11/22/2022)   [employer] of Occupational Health - Occupational Stress Questionnaire    Feeling of Stress : Not at all  Social Connections: Socially Integrated (11/22/2022)   Social Connection and Isolation Panel [NHANES]    Frequency of Communication with Friends and Family: Once a week    Frequency of Social Gatherings with Friends and Family: More than three times a week    Attends Religious Services: More than 4 times per year    Active Member of [employer] or Organizations: Yes    Attends Banker Meetings: Never    Marital Status: Married  Catering manager Violence: Not At Risk (11/22/2022)   Humiliation, Afraid, Rape, and Kick questionnaire    Fear of Current or Ex-Partner: No    Emotionally Abused: No    Physically Abused: No    Sexually Abused: No    Physical Exam      Future Appointments  Date Time Provider Department Center  11/10/2023 10:40 AM [name], Rheta Celestine, MD [facility] None  11/23/2023  8:10 AM [facility] ANNUAL WELLNESS VISIT [facility]  11/24/2023  9:15 AM McCaughan, Dia D, DPM [facility]  12/09/2023  7:00 AM [facility] DEVICE REMOTES [facility]  02/27/2024  8:40 AM Jeffie Mingle, MD [facility]  03/09/2024  7:00 AM [facility] DEVICE REMOTES [facility]  06/08/2024  7:00 AM [facility] DEVICE REMOTES [facility]  06/18/2024 10:00 AM Bair, Randa Burton, MD [facility]

## 2023-11-01 ENCOUNTER — Other Ambulatory Visit (HOSPITAL_COMMUNITY): Payer: Self-pay

## 2023-11-01 DIAGNOSIS — K259 Gastric ulcer, unspecified as acute or chronic, without hemorrhage or perforation: Secondary | ICD-10-CM

## 2023-11-01 DIAGNOSIS — R109 Unspecified abdominal pain: Secondary | ICD-10-CM

## 2023-11-01 MED ORDER — PANTOPRAZOLE SODIUM 40 MG PO TBEC: 40.0000 mg | DELAYED_RELEASE_TABLET | Freq: Every day | ORAL | 6 refills | Status: DC

## 2023-11-03 ENCOUNTER — Other Ambulatory Visit (HOSPITAL_COMMUNITY): Payer: Self-pay

## 2023-11-03 NOTE — Progress Notes (Signed)
 Paramedicine Encounter    Patient ID: John Keith, male    DOB: [birth year], 73 y.o.   MRN: [id number]   Complaints- none   Assessment- CAOX4, warm and dry seated in his dining room reporting to be feeling good with no complaints. He denied chest pain, shortness of breath,   Compliance with meds- no missed doses   Pill box filled- for one week    Refills needed- none   Meds changes since last visit- none     Social changes- none    VISIT SUMMARY- Arrived for home visit with [name] who reports to be feeling well with no complaints. He denied any shortness of breath, dizziness, chest pain or swelling. No substantial weight gain. Lungs clear. No missed meds. I reviewed and filled pill box for one week. We plan to see Dr. Julane Ny next week and will get a game plan for further treatment and plans for paramedicine. He agreed. Home visit complete.   BP 120/70   Pulse 66   Resp 16   Wt 239 lb (108.4 kg)   SpO2 96%   BMI 25.61 kg/m  Weight yesterday-- 239lbs  Last visit weight-- 237lbs      ACTION: Home visit completed     Patient Care Team: Valli Gaw, MD as PCP - General (Family Medicine) Lei Pump, MD as PCP - Electrophysiology (Cardiology) [name], Rheta Celestine, MD as PCP - Cardiology (Cardiology) Colene Dauphin, MD as Consulting Physician (Internal Medicine) Maudine Sos, MD as Attending Physician (Cardiology) Lei Pump, MD as Consulting Physician (Cardiology)  Patient Active Problem List   Diagnosis Date Noted   On anticoagulant therapy 08/29/2023   On amiodarone  therapy 08/29/2023   Dermatitis 01/31/2023   Onychomycosis 01/31/2023   Statin myopathy 11/15/2022   Abnormal glucose 11/15/2022   Prostate cancer screening 11/15/2022   Chronic systolic heart failure (HCC) 12/15/2021   Chronic combined systolic and diastolic CHF (congestive heart failure) (HCC) 12/15/2021   Nasal sore 12/15/2021   Syncope 12/07/2021   Ventricular  tachycardia (HCC) 12/07/2021   Typical atrial flutter (HCC) 09/24/2021   Secondary hypercoagulable state (HCC) 09/24/2021   Abnormal x-ray of neck 06/16/2021   Abnormal MRI, lumbar spine 10/23/2020   Chronic low back pain 10/23/2020   Neuropathy 10/23/2020   Coronary artery disease of native artery of native heart with stable angina pectoris (HCC) 10/23/2020   Impingement syndrome of left shoulder region 08/22/2020   Pain in joint of left shoulder 07/24/2020   Chronic left shoulder pain 07/11/2020   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   Anemia 12/13/2019   Chest pain of uncertain etiology 10/24/2019   PVC (premature ventricular contraction) 10/24/2019   Gastric ulcer without hemorrhage or perforation 09/28/2019   COVID-19 virus detected 07/12/2019   Elevated troponin 07/07/2019   Elevated serum protein level 07/07/2019   Postherpetic neuralgia 08/17/2018   Benign prostatic hyperplasia 08/01/2018   Hip pain 06/29/2018   Aortic atherosclerosis (HCC) 09/22/2016   Fatigue 06/03/2016   Lower urinary tract symptoms (LUTS) 06/03/2016   Constipation 03/10/2016   Lumbar radiculopathy 02/20/2016   Encounter for immunization 11/26/2015   Insomnia 06/10/2015   External hemorrhoid 03/07/2015   Health care maintenance 01/09/2014   Carpal tunnel syndrome of left wrist 01/03/2013   Hyperlipidemia 11/09/2012   Vitamin D  deficiency 11/09/2012   Essential hypertension 11/06/2012   Tobacco abuse 11/06/2012   Seasonal allergies 11/06/2012   Erectile dysfunction 11/06/2012   GERD (gastroesophageal reflux disease) 11/06/2012   DDD (degenerative disc disease),  thoracolumbar 11/06/2012   Lactose intolerance 11/06/2012   Diverticulosis of colon without hemorrhage 11/06/2012   Diverticulosis of colon 11/06/2012   Degeneration of thoracolumbar intervertebral disc 11/06/2012   Gastroesophageal reflux disease 11/06/2012   Tobacco user 11/06/2012    Current Outpatient Medications:    amiodarone   (PACERONE ) 200 MG tablet, Take 0.5 tablets (100 mg total) by mouth daily., Disp: 90 tablet, Rfl: 1   apixaban  (ELIQUIS ) 5 MG TABS tablet, Take 1 tablet (5 mg total) by mouth 2 (two) times daily., Disp: 180 tablet, Rfl: 3   calcium  carbonate (OS-CAL - DOSED IN MG OF ELEMENTAL CALCIUM ) 1250 (500 Ca) MG tablet, Take 1 tablet (500 mg of elemental calcium  total) by mouth daily with breakfast., Disp: 30 tablet, Rfl: 11   carvedilol  (COREG ) 6.25 MG tablet, Take 1 tablet (6.25 mg total) by mouth 2 (two) times daily with a meal., Disp: 180 tablet, Rfl: 3   Cholecalciferol  (VITAMIN D -3) 125 MCG (5000 UT) TABS, Take 1 tablet by mouth daily., Disp: 30 tablet, Rfl: 11   ezetimibe  (ZETIA ) 10 MG tablet, Take 1 tablet (10 mg total) by mouth daily., Disp: 90 tablet, Rfl: 3   FARXIGA  10 MG TABS tablet, Take 1 tablet (10 mg total) by mouth daily., Disp: 90 tablet, Rfl: 3   ferrous sulfate  325 (65 FE) MG tablet, Take 325 mg by mouth daily with breakfast., Disp: , Rfl:    folic acid  (FOLVITE ) 1 MG tablet, TAKE 1 TABLET(1 MG) BY MOUTH DAILY, Disp: 30 tablet, Rfl: 11   gabapentin  (NEURONTIN ) 300 MG capsule, TAKE 3 CAPSULES(900 MG) BY MOUTH DAILY, Disp: 270 capsule, Rfl: 1   methotrexate  (RHEUMATREX) 2.5 MG tablet, TAKE 20MG (8 TABLETS) EVERY WEDNESDAY. PROTECT FROM LIGHT AS DIRECTED, Disp: 150 tablet, Rfl: 0   Multiple Vitamin (MULTIVITAMIN) tablet, Take 1 tablet by mouth daily., Disp: , Rfl:    mupirocin  ointment (BACTROBAN ) 2 %, Apply 1 Application topically daily., Disp: 22 g, Rfl: 3   pantoprazole  (PROTONIX ) 40 MG tablet, Take 1 tablet (40 mg total) by mouth daily. 30 min before food, Disp: 30 tablet, Rfl: 6   sacubitril -valsartan  (ENTRESTO ) 24-26 MG, Take 1 tablet by mouth 2 (two) times daily., Disp: 180 tablet, Rfl: 3   spironolactone  (ALDACTONE ) 25 MG tablet, Take 1 tablet (25 mg total) by mouth daily., Disp: 90 tablet, Rfl: 3 Allergies  Allergen Reactions   Crestor  [Rosuvastatin  Calcium ] Other (See Comments)     Elevated muscle enzymes    Lipitor [Atorvastatin  Calcium ] Other (See Comments)    Elevated muscle enzymes      Social History   Socioeconomic History   Marital status: Married    Spouse name: Gwendolyn   Number of children: 2   Years of education: Not on file   Highest education level: High school graduate  Occupational History   Occupation: Retired    Comment: Semi  Tobacco Use   Smoking status: Former    Current packs/day: 0.00    Average packs/day: 0.5 packs/day for 49.0 years (24.5 ttl pk-yrs)    Types: Cigarettes    Start date: 07/06/1971    Quit date: 06/26/2020    Years since quitting: 3.3   Smokeless tobacco: Never   Tobacco comments:    Former smoker 09/24/21  Vaping Use   Vaping status: Never Used  Substance and Sexual Activity   Alcohol use: No    Alcohol/week: 0.0 standard drinks of alcohol   Drug use: No   Sexual activity: Not on file  Other Topics  Concern   Not on file  Social History Narrative   Married    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    On child    1 son, 1 daughter   Machine op   Owns guns    Wears seat belt, safe in relationship    Smoker    Retired 06/27/2019   Social Drivers of Health   Financial Resource Strain: Low Risk  (11/22/2022)   Overall Financial Resource Strain (CARDIA)    Difficulty of Paying Living Expenses: Not hard at all  Food Insecurity: No Food Insecurity (11/22/2022)   Hunger Vital Sign    Worried About Running Out of Food in the Last Year: Never true    Ran Out of Food in the Last Year: Never true  Transportation Needs: No Transportation Needs (11/22/2022)   PRAPARE - Administrator, Civil Service (Medical): No    Lack of Transportation (Non-Medical): No  Physical Activity: Sufficiently Active (11/22/2022)   Exercise Vital Sign    Days of Exercise per Week: 3 days    Minutes of Exercise per Session: 60 min  Stress: No Stress Concern Present (11/22/2022)   [employer] of Occupational Health - Occupational Stress  Questionnaire    Feeling of Stress : Not at all  Social Connections: Socially Integrated (11/22/2022)   Social Connection and Isolation Panel [NHANES]    Frequency of Communication with Friends and Family: Once a week    Frequency of Social Gatherings with Friends and Family: More than three times a week    Attends Religious Services: More than 4 times per year    Active Member of [employer] or Organizations: Yes    Attends Banker Meetings: Never    Marital Status: Married  Catering manager Violence: Not At Risk (11/22/2022)   Humiliation, Afraid, Rape, and Kick questionnaire    Fear of Current or Ex-Partner: No    Emotionally Abused: No    Physically Abused: No    Sexually Abused: No    Physical Exam      Future Appointments  Date Time Provider Department Center  11/10/2023 10:40 AM [name], Rheta Celestine, MD [facility] None  11/23/2023  8:10 AM [facility] ANNUAL WELLNESS VISIT [facility]  11/24/2023  9:15 AM McCaughan, Dia D, DPM [facility]  12/09/2023  7:00 AM [facility] DEVICE REMOTES [facility]  02/27/2024  8:40 AM Jeffie Mingle, MD [facility]  03/09/2024  7:00 AM [facility] DEVICE REMOT[facility]  06/08/2024  7:00 AM [facility] DEVICE REMOTES [facility]  06/18/2024 10:00 AM Bair, Randa Burton, MD [facility]

## 2023-11-10 ENCOUNTER — Encounter (HOSPITAL_COMMUNITY): Payer: Self-pay | Admitting: Internal Medicine

## 2023-11-10 ENCOUNTER — Other Ambulatory Visit (HOSPITAL_COMMUNITY): Payer: Self-pay

## 2023-11-10 ENCOUNTER — Ambulatory Visit (HOSPITAL_COMMUNITY)
Admission: RE | Admit: 2023-11-10 | Discharge: 2023-11-10 | Disposition: A | Discharge status: Home / Self Care | Source: Ambulatory Visit | Attending: Internal Medicine | Admitting: Internal Medicine

## 2023-11-10 VITALS — BP 116/72 | HR 73 | Ht >= 80 in | Wt 237.8 lb

## 2023-11-10 DIAGNOSIS — I2510 Atherosclerotic heart disease of native coronary artery without angina pectoris: Secondary | ICD-10-CM | POA: Diagnosis not present

## 2023-11-10 DIAGNOSIS — I110 Hypertensive heart disease with heart failure: Secondary | ICD-10-CM | POA: Diagnosis not present

## 2023-11-10 DIAGNOSIS — E785 Hyperlipidemia, unspecified: Secondary | ICD-10-CM | POA: Diagnosis not present

## 2023-11-10 DIAGNOSIS — Z87891 Personal history of nicotine dependence: Secondary | ICD-10-CM | POA: Diagnosis not present

## 2023-11-10 DIAGNOSIS — I4892 Unspecified atrial flutter: Secondary | ICD-10-CM | POA: Insufficient documentation

## 2023-11-10 DIAGNOSIS — Z79899 Other long term (current) drug therapy: Secondary | ICD-10-CM | POA: Diagnosis not present

## 2023-11-10 DIAGNOSIS — R55 Syncope and collapse: Secondary | ICD-10-CM | POA: Diagnosis not present

## 2023-11-10 DIAGNOSIS — Z7901 Long term (current) use of anticoagulants: Secondary | ICD-10-CM | POA: Insufficient documentation

## 2023-11-10 DIAGNOSIS — Z7984 Long term (current) use of oral hypoglycemic drugs: Secondary | ICD-10-CM | POA: Insufficient documentation

## 2023-11-10 DIAGNOSIS — I472 Ventricular tachycardia, unspecified: Secondary | ICD-10-CM | POA: Diagnosis not present

## 2023-11-10 DIAGNOSIS — I5042 Chronic combined systolic (congestive) and diastolic (congestive) heart failure: Secondary | ICD-10-CM

## 2023-11-10 DIAGNOSIS — Z95810 Presence of automatic (implantable) cardiac defibrillator: Secondary | ICD-10-CM | POA: Diagnosis not present

## 2023-11-10 DIAGNOSIS — D8685 Sarcoid myocarditis: Secondary | ICD-10-CM | POA: Diagnosis not present

## 2023-11-10 DIAGNOSIS — I5022 Chronic systolic (congestive) heart failure: Secondary | ICD-10-CM | POA: Insufficient documentation

## 2023-11-10 DIAGNOSIS — D869 Sarcoidosis, unspecified: Secondary | ICD-10-CM | POA: Insufficient documentation

## 2023-11-10 DIAGNOSIS — M542 Cervicalgia: Secondary | ICD-10-CM | POA: Insufficient documentation

## 2023-11-10 LAB — CBC
HCT: 42 % (ref 39.0–52.0)
Hemoglobin: 13.4 g/dL (ref 13.0–17.0)
MCH: 32.7 pg (ref 26.0–34.0)
MCHC: 31.9 g/dL (ref 30.0–36.0)
MCV: 102.4 fL — ABNORMAL HIGH (ref 80.0–100.0)
Platelets: 298 10*3/uL (ref 150–400)
RBC: 4.1 MIL/uL — ABNORMAL LOW (ref 4.22–5.81)
RDW: 14.1 % (ref 11.5–15.5)
WBC: 7.2 10*3/uL (ref 4.0–10.5)
nRBC: 0 % (ref 0.0–0.2)

## 2023-11-10 LAB — COMPREHENSIVE METABOLIC PANEL WITH GFR
ALT: 27 U/L (ref 0–44)
AST: 30 U/L (ref 15–41)
Albumin: 4 g/dL (ref 3.5–5.0)
Alkaline Phosphatase: 80 U/L (ref 38–126)
Anion gap: 7 (ref 5–15)
BUN: 16 mg/dL (ref 8–23)
CO2: 25 mmol/L (ref 22–32)
Calcium: 9.3 mg/dL (ref 8.9–10.3)
Chloride: 107 mmol/L (ref 98–111)
Creatinine, Ser: 1.45 mg/dL — ABNORMAL HIGH (ref 0.61–1.24)
GFR, Estimated: 51 mL/min — ABNORMAL LOW (ref >60)
Glucose, Bld: 104 mg/dL — ABNORMAL HIGH (ref 70–99)
Potassium: 4.3 mmol/L (ref 3.5–5.1)
Sodium: 139 mmol/L (ref 135–145)
Total Bilirubin: 0.7 mg/dL (ref 0.0–1.2)
Total Protein: 7.8 g/dL (ref 6.5–8.1)

## 2023-11-10 LAB — BRAIN NATRIURETIC PEPTIDE: B Natriuretic Peptide: 24.5 pg/mL (ref 0.0–100.0)

## 2023-11-10 LAB — TSH: TSH: 0.845 u[IU]/mL (ref 0.350–4.500)

## 2023-11-10 LAB — MAGNESIUM: Magnesium: 2.3 mg/dL (ref 1.7–2.4)

## 2023-11-10 MED ORDER — EZETIMIBE 10 MG PO TABS: 10.0000 mg | ORAL_TABLET | Freq: Every day | ORAL | 3 refills | Status: DC

## 2023-11-10 MED ORDER — FOLIC ACID 1 MG PO TABS: 1.0000 mg | ORAL_TABLET | Freq: Every day | ORAL | 5 refills | Status: DC

## 2023-11-10 NOTE — Progress Notes (Signed)
 Paramedicine Encounter  Patient ID: John Keith, male, DOB: 09/27/1950, 73 y.o.,  MRN: [id number]  Met patient in clinic today with Dr. [name]. [name] reports feeling well with no complaints aside from neck pain from a possible pinched nerve. He denied chest pain, dizziness, shortness of breath or swelling. Weight is stable.  Dr. Julane Ny reports his heart function is back to normal but is ordering a repeat ECHO. Sarcoid is stable. Methotrexate  being managed by Rheumatology. Amiodarone  stopped today. Labs taken. I will follow up in the home in one week. Pill box filled for one week. Clinic visit complete.   Weight @ clinic-237lbs  Weight @ home- 239lbs  B/P- 116/72  P- 73  Med changes- STOP AMIODARONE    Social Changes- None    Roberts Ching, EMT-Paramedic [phone] 11/10/2023

## 2023-11-10 NOTE — Addendum Note (Signed)
 Encounter addended by: Melesa Lecy B, RN on: 11/10/2023 11:23 AM  Actions taken: Clinical Note Signed, Medication long-term status modified, Order list changed, Diagnosis association updated, Charge Capture section accepted

## 2023-11-10 NOTE — Addendum Note (Signed)
 Encounter addended by: Loralai Eisman B, RN on: 11/10/2023 11:27 AM  Actions taken: Visit diagnoses modified, Pharmacy for encounter modified, Order list changed, Diagnosis association updated

## 2023-11-10 NOTE — Progress Notes (Signed)
 ADVANCED HF CLINIC NOTE   PCP: Dr. Valli Gaw HF Cardiologist: Dr. Julane Ny  HPI: 73 y.o. 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    He established with Afib clinic in March for atrial flutter. He underwent successful DCCV 04/17. Despite restoration of SR he noted recurrent palpitations, lightheadedness and chest discomfort at f/u 05/15. 14 day Zio placed. Seen in Cardiology clinic 12/03/21 with palpitations, recurrent near syncope and a syncopal episode. BP meds reduced. Read on 14 day Zio was expedited and showed sustained episodes of VT (longest 1 minute 30 seconds).    Patient was referred to ED for admission d/t recurrent VT on monitor. EP was consulted. Echo showed EF 35-40%, grade II DD, RV mildly reduced, RVSP 27 mmHg. Cardiac MRI: EF 33%, RVEF 40%, with multifocal areas of LGE with high intensity in basal septal LGE, elevated T2 values in lateral wall concerning for myocardial inflammation, concerning for sarcoidosis. LHC showed mild nonobstructive CAD. He was started on amiodarone  and underwent ICD placement 12/10/21. Workup including PET for cardiac sarcoid recommended as outpatient.   NM cardiac amyloid tumor LOC: Visual and quantitative assessment (grade 1, H/CL equal 1.28) is equivocally suggestive of transthyretin amyloidosis.   Follow up 7/23 with Dr. Julane Ny. NYHA II, volume stable. Low dose spiro started.   Cardiac PET at [facility] (8/23) without evidence of cardiac sarcoidosis or inflammation .   Follow up 11/23, was doing well and had completed prednisone .   7/23 had repeat PET showing no evidence of sarcoid.   He presents today for routine f/u. Here w/ wife. 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 Feels good. Denies, CP or SOB. No ICD firings. Follows with Dr. Ebbie Goldmann in Rheum. On methotrexate  20 weekly, Neck sore   Echo 12/24 EF 55-60%    Cardiac Studies: -  Repeat Cardiac PET (post pred/MXT regimen) 7/24 no evidence of sarcoid.  - Cardiac PET at [facility] (8/23) EF 43% without evidence of cardiac sarcoidosis or inflammation.   - PYP (6/23): Visual and quantitative assessment (grade 1, H/CL equal 1.28) is equivocally suggestive of transthyretin amyloidosis.  - LHC (6/23): mild, non-obs CAD.    - cMRI (6/23): LVEF 33%, RVEF 40%, with multifocal areas of LGE with high intensity in basal septal LGE, elevated T2 values in lateral wall concerning for myocardial inflammation, concerning for sarcoidosis.   - Echo (6/23): EF 35-40%, grade II DD, RV mildly reduced, RVSP 27 mmHg.  - Zio 14 day (6/23): mostly NSR, multiple episodes of wide-complex tachycardia due to VT  Past Medical History:  Diagnosis Date   Allergy    Arthritis    Atrial flutter (HCC)    Carpal tunnel syndrome on left    Chest pain of uncertain etiology 10/24/2019   COVID-19    07/07/19   DDD (degenerative disc disease), thoracolumbar    multilevel   Degenerative joint disease of left shoulder    02/2011    Diverticulosis    left colon (2008)    ED (erectile dysfunction)    02/2011    GERD (gastroesophageal reflux disease)    H. pylori infection    Hyperlipidemia    Hypertension    Insomnia    Lactose intolerance    Low back pain    PVC (premature ventricular contraction) 10/24/2019   Shingles    07/2018   Smoking    smoking since age 14 y.o  Toe fracture, left    4th in 2014   Vitamin D  deficiency    Current Outpatient Medications  Medication Sig Dispense Refill   amiodarone  (PACERONE ) 200 MG tablet Take 0.5 tablets (100 mg total) by mouth daily. 90 tablet 1   apixaban  (ELIQUIS ) 5 MG TABS tablet Take 1 tablet (5 mg total) by mouth 2 (two) times daily. 180 tablet 3   calcium  carbonate (OS-CAL - DOSED IN MG OF ELEMENTAL CALCIUM ) 1250 (500 Ca) MG tablet Take 1 tablet (500 mg of elemental calcium  total) by mouth daily with breakfast. 30 tablet 11   carvedilol  (COREG ) 6.25  MG tablet Take 1 tablet (6.25 mg total) by mouth 2 (two) times daily with a meal. 180 tablet 3   Cholecalciferol  (VITAMIN D -3) 125 MCG (5000 UT) TABS Take 1 tablet by mouth daily. 30 tablet 11   ezetimibe  (ZETIA ) 10 MG tablet Take 1 tablet (10 mg total) by mouth daily. 90 tablet 3   FARXIGA  10 MG TABS tablet Take 1 tablet (10 mg total) by mouth daily. 90 tablet 3   ferrous sulfate  325 (65 FE) MG tablet Take 325 mg by mouth daily with breakfast.     folic acid  (FOLVITE ) 1 MG tablet TAKE 1 TABLET(1 MG) BY MOUTH DAILY 30 tablet 11   gabapentin  (NEURONTIN ) 300 MG capsule TAKE 3 CAPSULES(900 MG) BY MOUTH DAILY 270 capsule 1   methotrexate  (RHEUMATREX) 2.5 MG tablet TAKE 20MG (8 TABLETS) EVERY WEDNESDAY. PROTECT FROM LIGHT AS DIRECTED 150 tablet 0   Multiple Vitamin (MULTIVITAMIN) tablet Take 1 tablet by mouth daily.     mupirocin  ointment (BACTROBAN ) 2 % Apply 1 Application topically daily. 22 g 3   pantoprazole  (PROTONIX ) 40 MG tablet Take 1 tablet (40 mg total) by mouth daily. 30 min before food 30 tablet 6   sacubitril -valsartan  (ENTRESTO ) 24-26 MG Take 1 tablet by mouth 2 (two) times daily. 180 tablet 3   spironolactone  (ALDACTONE ) 25 MG tablet Take 1 tablet (25 mg total) by mouth daily. 90 tablet 3   No current facility-administered medications for this encounter.   Allergies  Allergen Reactions   Crestor  [Rosuvastatin  Calcium ] Other (See Comments)    Elevated muscle enzymes    Lipitor [Atorvastatin  Calcium ] Other (See Comments)    Elevated muscle enzymes    Social History   Socioeconomic History   Marital status: Married    Spouse name: Gwendolyn   Number of children: 2   Years of education: Not on file   Highest education level: High school graduate  Occupational History   Occupation: Retired    Comment: Semi  Tobacco Use   Smoking status: Former    Current packs/day: 0.00    Average packs/day: 0.5 packs/day for 49.0 years (24.5 ttl pk-yrs)    Types: Cigarettes    Start  date: 07/06/1971    Quit date: 06/26/2020    Years since quitting: 3.3   Smokeless tobacco: Never   Tobacco comments:    Former smoker 09/24/21  Vaping Use   Vaping status: Never Used  Substance and Sexual Activity   Alcohol use: No    Alcohol/week: 0.0 standard drinks of alcohol   Drug use: No   Sexual activity: Not on file  Other Topics Concern   Not on file  Social History Narrative   Married    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    On child    1 son, 1 daughter   Systems analyst op   Owns guns    Wears seat belt,  safe in relationship    Smoker    Retired 06/27/2019   Social Drivers of [employer]: Low Risk  (11/22/2022)   Overall Financial Resource Strain (CARDIA)    Difficulty of Paying Living Expenses: Not hard at all  Food Insecurity: No Food Insecurity (11/22/2022)   Hunger Vital Sign    Worried About Running Out of Food in the Last Year: Never true    Ran Out of Food in the Last Year: Never true  Transportation Needs: No Transportation Needs (11/22/2022)   PRAPARE - Administrator, Civil Service (Medical): No    Lack of Transportation (Non-Medical): No  Physical Activity: Sufficiently Active (11/22/2022)   Exercise Vital Sign    Days of Exercise per Week: 3 days    Minutes of Exercise per Session: 60 min  Stress: No Stress Concern Present (11/22/2022)   [employer] of Occupational Health - Occupational Stress Questionnaire    Feeling of Stress : Not at all  Social Connections: Socially Integrated (11/22/2022)   Social Connection and Isolation Panel [NHANES]    Frequency of Communication with Friends and Family: Once a week    Frequency of Social Gatherings with Friends and Family: More than three times a week    Attends Religious Services: More than 4 times per year    Active Member of [employer] or Organizations: Yes    Attends Banker Meetings: Never    Marital Status: Married  Catering manager Violence: Not At Risk (11/22/2022)    Humiliation, Afraid, Rape, and Kick questionnaire    Fear of Current or Ex-Partner: No    Emotionally Abused: No    Physically Abused: No    Sexually Abused: No   Family History  Problem Relation Age of Onset   Heart disease Mother        age 55    Throat cancer Father 53   Cancer Father        throat    Cancer Maternal Uncle        uncle ?maternal or paternal died colon cancer in his 89s    Diabetes Maternal Aunt    Heart attack Maternal Grandmother   - His mother and grandmother had heart issues, uncertain if they had CHF.  BP 116/72   Pulse 73   Ht 6\' 9"  (2.057 m)   Wt 107.9 kg (237 lb 12.8 oz)   SpO2 96%   BMI 25.48 kg/m   Wt Readings from Last 3 Encounters:  11/10/23 107.9 kg (237 lb 12.8 oz)  11/03/23 108.4 kg (239 lb)  10/27/23 107.6 kg (237 lb 3.2 oz)   PHYSICAL EXAM: General:  Well appearing. No resp difficulty HEENT: normal Neck: supple. no JVD. Carotids 2+ bilat; no bruits. No lymphadenopathy or thryomegaly appreciated. Cor: PMI nondisplaced. Regular rate & rhythm. No rubs, gallops or murmurs. Lungs: clear Abdomen: soft, nontender, nondistended. No hepatosplenomegaly. No bruits or masses. Good bowel sounds. Extremities: no cyanosis, clubbing, rash, edema Neuro: alert & orientedx3, cranial nerves grossly intact. moves all 4 extremities w/o difficulty. Affect pleasant  MDT ICD interrogation (personally reviewed): No VT/AF Activity level 2.1hr/week. Volume ok Personally reviewed  ECG: NSR 74 LVH with repol Personally reviewed   ASSESSMENT & PLAN: Chronic systolic CHF/NICM in setting of cardiac sarcoidosis - Echo (6/23): EF 35-40%, grade II DD, RV mildly reduced, RVSP 27 mmHg. - Cardiac MRI (6/23): EF 33%, RVEF 40%, with multifocal areas of LGE with high intensity in basal septal  LGE, elevated T2 values in lateral wall concerning for myocardial inflammation. Findings concerning for sarcoidosis.  - LHC (6/23): mild nonobstructive CAD. - Suspect etiology  cardiac sarcoidosis (most likely) vs hereditary LMNA cardiomyopathy. - Cardiac PET at [facility] (8/23) showed no evidence of cardiac sarcoidosis. EF 43% - Completed Prednisone  11/23  - Repeat PET 7/24 no evidence of sarcoid - Continue maintenance dose methotrexate  20 mg weekly ( Has seen Dr. Ebbie Goldmann who agrees) - Echo 12/24 EF 55-60% Personally reviewed - Stable NYHA I-II - Volume ok  - Continue spironolactone  25 mg daily.  - Continue Coreg  6.25 mg bid. - Continue Entresto  24/26 mg bid. H/o low BP/orthostatics limits further titration  - Continue Farxiga  10 mg daily. - ICD interrogated personally as above - Labs today   2. VT/Hx syncope: - ICD interrogated personally. No VT/AF - Stop amio as sarcoid quiescent and EF normal   3. Atrial flutter: - Remains in NSR on amio 100  - Will stop amio - Continue Eliquis    4. CAD: - Mild on LHC 6/23. - no s/s angina - no ASA w/ a/c  - Continue Zetia . Has history of elevated LFTs with statins.    Jules Oar, MD  11:07 AM

## 2023-11-10 NOTE — Patient Instructions (Signed)
 Medication Changes:  STOP TAKING AMIODARONE    Lab Work:  Labs done today, your results will be available in MyChart, we will contact you for abnormal readings.  Follow-Up in: 6 MONTHS PLEASE CALL OUR OFFICE AROUND SEPTEMBER TO GET SCHEDULED FOR YOUR APPOINTMENT. PHONE NUMBER IS [phone] OPTION 2   At the [facility], you and your health needs are our priority. We have a designated team specialized in the treatment of Heart Failure. This Care Team includes your primary Heart Failure Specialized Cardiologist (physician), Advanced Practice Providers (APPs- Physician Assistants and Nurse Practitioners), and Pharmacist who all work together to provide you with the care you need, when you need it.   You may see any of the following providers on your designated Care Team at your next follow up:  Dr. Jules Oar Dr. Peder Bourdon Dr. Alwin Baars Dr. Judyth Nunnery Nieves Bars, NP Ruddy Corral, [place] [facility] [place], [place] Dennise Fitz, NP [place] Lee, NP Luster Salters, PharmD   Please be sure to bring in all your medications bottles to every appointment.   Need to Contact Us :  If you have any questions or concerns before your next appointment please send us  a message through [place] or call our office at [phone].    TO LEAVE A MESSAGE FOR THE NURSE SELECT OPTION 2, PLEASE LEAVE A MESSAGE INCLUDING: YOUR NAME DATE OF BIRTH CALL BACK NUMBER REASON FOR CALL**this is important as we prioritize the call backs  YOU WILL RECEIVE A CALL BACK THE SAME DAY AS LONG AS YOU CALL BEFORE 4:00 PM

## 2023-11-11 LAB — T4: T4, Total: 9.1 ug/dL (ref 4.5–12.0)

## 2023-11-11 LAB — T3: T3, Total: 101 ng/dL (ref 71–180)

## 2023-11-16 ENCOUNTER — Other Ambulatory Visit (HOSPITAL_COMMUNITY): Payer: Self-pay

## 2023-11-16 NOTE — Progress Notes (Signed)
 Paramedicine Encounter    Patient ID: John Keith, male    DOB: 06-Jun-1951, 73 y.o.   MRN: [id number]   Complaints- none   Assessment- CAOX4, warm and dry reporting no complaints, no swelling, lungs clear, vitals within range, no swelling, no palpations with recent med change of stopping amiodarone    Compliance with meds- no missed doses  Pill box filled- for one week   Refills needed- none   Meds changes since last visit- amio stopped at last visit     Social changes- none    VISIT SUMMARY- Arrived for home visit for John Keith who reports to be feeling well with no complaints. He denied any shortness of breath, dizziness. Vitals obtained. Assessment as noted. No lower leg swelling. Meds reviewed and confirmed. Pill box filled for one week. No refills needed. Appointments reviewed. He denied any symptoms with recent med changes. Home visit complete. I will see him in one week.   BP 102/60   Pulse 72   Resp 16   Wt 240 lb (108.9 kg)   SpO2 94%   BMI 25.72 kg/m  Weight yesterday-- 240.4lbs  Last visit weight-- 237lbs      ACTION: Home visit completed     Patient Care Team: Valli Gaw, MD as PCP - General (Family Medicine) Lei Pump, MD as PCP - Electrophysiology (Cardiology) [name], Rheta Celestine, MD as PCP - Cardiology (Cardiology) Colene Dauphin, MD as Consulting Physician (Internal Medicine) Maudine Sos, MD as Attending Physician (Cardiology) Lei Pump, MD as Consulting Physician (Cardiology)  Patient Active Problem List   Diagnosis Date Noted   On anticoagulant therapy 08/29/2023   On amiodarone  therapy 08/29/2023   Dermatitis 01/31/2023   Onychomycosis 01/31/2023   Statin myopathy 11/15/2022   Abnormal glucose 11/15/2022   Prostate cancer screening 11/15/2022   Chronic systolic heart failure (HCC) 12/15/2021   Chronic combined systolic and diastolic CHF (congestive heart failure) (HCC) 12/15/2021   Nasal sore 12/15/2021    Syncope 12/07/2021   Ventricular tachycardia (HCC) 12/07/2021   Typical atrial flutter (HCC) 09/24/2021   Secondary hypercoagulable state (HCC) 09/24/2021   Abnormal x-ray of neck 06/16/2021   Abnormal MRI, lumbar spine 10/23/2020   Chronic low back pain 10/23/2020   Neuropathy 10/23/2020   Coronary artery disease of native artery of native heart with stable angina pectoris (HCC) 10/23/2020   Impingement syndrome of left shoulder region 08/22/2020   Pain in joint of left shoulder 07/24/2020   Chronic left shoulder pain 07/11/2020   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   Anemia 12/13/2019   Chest pain of uncertain etiology 10/24/2019   PVC (premature ventricular contraction) 10/24/2019   Gastric ulcer without hemorrhage or perforation 09/28/2019   COVID-19 virus detected 07/12/2019   Elevated troponin 07/07/2019   Elevated serum protein level 07/07/2019   Postherpetic neuralgia 08/17/2018   Benign prostatic hyperplasia 08/01/2018   Hip pain 06/29/2018   Aortic atherosclerosis (HCC) 09/22/2016   Fatigue 06/03/2016   Lower urinary tract symptoms (LUTS) 06/03/2016   Constipation 03/10/2016   Lumbar radiculopathy 02/20/2016   Encounter for immunization 11/26/2015   Insomnia 06/10/2015   External hemorrhoid 03/07/2015   Health care maintenance 01/09/2014   Carpal tunnel syndrome of left wrist 01/03/2013   Hyperlipidemia 11/09/2012   Vitamin D  deficiency 11/09/2012   Essential hypertension 11/06/2012   Tobacco abuse 11/06/2012   Seasonal allergies 11/06/2012   Erectile dysfunction 11/06/2012   GERD (gastroesophageal reflux disease) 11/06/2012   DDD (degenerative disc disease), thoracolumbar 11/06/2012  Lactose intolerance 11/06/2012   Diverticulosis of colon without hemorrhage 11/06/2012   Diverticulosis of colon 11/06/2012   Degeneration of thoracolumbar intervertebral disc 11/06/2012   Gastroesophageal reflux disease 11/06/2012   Tobacco user 11/06/2012    Current Outpatient  Medications:    apixaban  (ELIQUIS ) 5 MG TABS tablet, Take 1 tablet (5 mg total) by mouth 2 (two) times daily., Disp: 180 tablet, Rfl: 3   calcium  carbonate (OS-CAL - DOSED IN MG OF ELEMENTAL CALCIUM ) 1250 (500 Ca) MG tablet, Take 1 tablet (500 mg of elemental calcium  total) by mouth daily with breakfast., Disp: 30 tablet, Rfl: 11   carvedilol  (COREG ) 6.25 MG tablet, Take 1 tablet (6.25 mg total) by mouth 2 (two) times daily with a meal., Disp: 180 tablet, Rfl: 3   Cholecalciferol  (VITAMIN D -3) 125 MCG (5000 UT) TABS, Take 1 tablet by mouth daily., Disp: 30 tablet, Rfl: 11   ezetimibe  (ZETIA ) 10 MG tablet, Take 1 tablet (10 mg total) by mouth daily., Disp: 90 tablet, Rfl: 3   FARXIGA  10 MG TABS tablet, Take 1 tablet (10 mg total) by mouth daily., Disp: 90 tablet, Rfl: 3   ferrous sulfate  325 (65 FE) MG tablet, Take 325 mg by mouth daily with breakfast., Disp: , Rfl:    folic acid  (FOLVITE ) 1 MG tablet, Take 1 tablet (1 mg total) by mouth daily., Disp: 30 tablet, Rfl: 5   gabapentin  (NEURONTIN ) 300 MG capsule, TAKE 3 CAPSULES(900 MG) BY MOUTH DAILY, Disp: 270 capsule, Rfl: 1   methotrexate  (RHEUMATREX) 2.5 MG tablet, TAKE 20MG (8 TABLETS) EVERY WEDNESDAY. PROTECT FROM LIGHT AS DIRECTED, Disp: 150 tablet, Rfl: 0   Multiple Vitamin (MULTIVITAMIN) tablet, Take 1 tablet by mouth daily., Disp: , Rfl:    mupirocin  ointment (BACTROBAN ) 2 %, Apply 1 Application topically daily., Disp: 22 g, Rfl: 3   pantoprazole  (PROTONIX ) 40 MG tablet, Take 1 tablet (40 mg total) by mouth daily. 30 min before food, Disp: 30 tablet, Rfl: 6   sacubitril -valsartan  (ENTRESTO ) 24-26 MG, Take 1 tablet by mouth 2 (two) times daily., Disp: 180 tablet, Rfl: 3   spironolactone  (ALDACTONE ) 25 MG tablet, Take 1 tablet (25 mg total) by mouth daily., Disp: 90 tablet, Rfl: 3 Allergies  Allergen Reactions   Crestor  [Rosuvastatin  Calcium ] Other (See Comments)    Elevated muscle enzymes    Lipitor [Atorvastatin  Calcium ] Other (See  Comments)    Elevated muscle enzymes      Social History   Socioeconomic History   Marital status: Married    Spouse name: Gwendolyn   Number of children: 2   Years of education: Not on file   Highest education level: High school graduate  Occupational History   Occupation: Retired    Comment: Semi  Tobacco Use   Smoking status: Former    Current packs/day: 0.00    Average packs/day: 0.5 packs/day for 49.0 years (24.5 ttl pk-yrs)    Types: Cigarettes    Start date: 07/06/1971    Quit date: 06/26/2020    Years since quitting: 3.3   Smokeless tobacco: Never   Tobacco comments:    Former smoker 09/24/21  Vaping Use   Vaping status: Never Used  Substance and Sexual Activity   Alcohol use: No    Alcohol/week: 0.0 standard drinks of alcohol   Drug use: No   Sexual activity: Not on file  Other Topics Concern   Not on file  Social History Narrative   Married    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    On  child    1 son, 1 daughter   Machine op   Owns guns    Wears seat belt, safe in relationship    Smoker    Retired 06/27/2019   Social Drivers of [employer] Strain: Low Risk  (11/22/2022)   Overall Financial Resource Strain (CARDIA)    Difficulty of Paying Living Expenses: Not hard at all  Food Insecurity: No Food Insecurity (11/22/2022)   Hunger Vital Sign    Worried About Running Out of Food in the Last Year: Never true    Ran Out of Food in the Last Year: Never true  Transportation Needs: No Transportation Needs (11/22/2022)   PRAPARE - Administrator, Civil Service (Medical): No    Lack of Transportation (Non-Medical): No  Physical Activity: Sufficiently Active (11/22/2022)   Exercise Vital Sign    Days of Exercise per Week: 3 days    Minutes of Exercise per Session: 60 min  Stress: No Stress Concern Present (11/22/2022)   [employer] of Occupational Health - Occupational Stress Questionnaire    Feeling of Stress : Not at all  Social Connections:  Socially Integrated (11/22/2022)   Social Connection and Isolation Panel [NHANES]    Frequency of Communication with Friends and Family: Once a week    Frequency of Social Gatherings with Friends and Family: More than three times a week    Attends Religious Services: More than 4 times per year    Active Member of [employer] or Organizations: Yes    Attends Banker Meetings: Never    Marital Status: Married  Catering manager Violence: Not At Risk (11/22/2022)   Humiliation, Afraid, Rape, and Kick questionnaire    Fear of Current or Ex-Partner: No    Emotionally Abused: No    Physically Abused: No    Sexually Abused: No    Physical Exam      Future Appointments  Date Time Provider Department Center  11/23/2023  8:10 AM [facility] ANNUAL WELLNESS VISIT [facility]  11/24/2023  9:15 AM McCaughan, Dia D, DPM [facility]  12/09/2023  7:00 AM [facility] H[facility] DEVICE REMOTES [facility]  02/27/2024  8:40 AM Jeffie Mingle, MD [facility]  03/09/2024  7:00 AM [facility] HVT DEVICE REMOTES [facility]  06/08/2024  7:00 AM [facility] HVT DEVICE REMOTES [facility]  06/18/2024 10:00 AM Bair, Randa Burton, MD [facility]

## 2023-11-22 ENCOUNTER — Other Ambulatory Visit (HOSPITAL_COMMUNITY): Payer: Self-pay

## 2023-11-22 NOTE — Progress Notes (Signed)
 Paramedicine Encounter    Patient ID: John Keith, male    DOB: [birth year], 74 y.o.   MRN: [id number]   Complaints- none   Assessment- CAOX4, warm and dry seated at kitchen table, no shortness of breath more than normal, no chest pain, no dizziness, no increased fatigue, no palpitations. Vitals within normal limits. Lungs clear. No swelling.   Compliance with meds- no missed doses   Pill box filled- for one and half weeks   Refills needed- folic acid , pantoprazole    Meds changes since last visit- none     Social changes- none    VISIT SUMMARY- Arrived for home visit for [name] who reports to be feeling well. Vitals and assessment obtained. Med reviewed- pillbox filled for one and half weeks. Appointments reviewed. No chest pain, no swelling, no weight gain, no priority symptoms. We discussed HF education. Home visit complete. I will see [name] next Thursday.   BP 120/72   Pulse 74   Resp 16   Wt 237 lb (107.5 kg)   SpO2 94%   BMI 25.40 kg/m  Weight yesterday-- 238lbs  Last visit weight-- 240lbs      ACTION: Home visit completed     Patient Care Team: Valli Gaw, MD as PCP - General (Family Medicine) Lei Pump, MD as PCP - Electrophysiology (Cardiology) [name], Rheta Celestine, MD as PCP - Cardiology (Cardiology) Colene Dauphin, MD as Consulting Physician (Internal Medicine) Maudine Sos, MD as Attending Physician (Cardiology) Lei Pump, MD as Consulting Physician (Cardiology)  Patient Active Problem List   Diagnosis Date Noted   On anticoagulant therapy 08/29/2023   On amiodarone  therapy 08/29/2023   Dermatitis 01/31/2023   Onychomycosis 01/31/2023   Statin myopathy 11/15/2022   Abnormal glucose 11/15/2022   Prostate cancer screening 11/15/2022   Chronic systolic heart failure (HCC) 12/15/2021   Chronic combined systolic and diastolic CHF (congestive heart failure) (HCC) 12/15/2021   Nasal sore 12/15/2021   Syncope 12/07/2021    Ventricular tachycardia (HCC) 12/07/2021   Typical atrial flutter (HCC) 09/24/2021   Secondary hypercoagulable state (HCC) 09/24/2021   Abnormal x-ray of neck 06/16/2021   Abnormal MRI, lumbar spine 10/23/2020   Chronic low back pain 10/23/2020   Neuropathy 10/23/2020   Coronary artery disease of native artery of native heart with stable angina pectoris (HCC) 10/23/2020   Impingement syndrome of left shoulder region 08/22/2020   Pain in joint of left shoulder 07/24/2020   Chronic left shoulder pain 07/11/2020   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   Anemia 12/13/2019   Chest pain of uncertain etiology 10/24/2019   PVC (premature ventricular contraction) 10/24/2019   Gastric ulcer without hemorrhage or perforation 09/28/2019   COVID-19 virus detected 07/12/2019   Elevated troponin 07/07/2019   Elevated serum protein level 07/07/2019   Postherpetic neuralgia 08/17/2018   Benign prostatic hyperplasia 08/01/2018   Hip pain 06/29/2018   Aortic atherosclerosis (HCC) 09/22/2016   Fatigue 06/03/2016   Lower urinary tract symptoms (LUTS) 06/03/2016   Constipation 03/10/2016   Lumbar radiculopathy 02/20/2016   Encounter for immunization 11/26/2015   Insomnia 06/10/2015   External hemorrhoid 03/07/2015   Health care maintenance 01/09/2014   Carpal tunnel syndrome of left wrist 01/03/2013   Hyperlipidemia 11/09/2012   Vitamin D  deficiency 11/09/2012   Essential hypertension 11/06/2012   Tobacco abuse 11/06/2012   Seasonal allergies 11/06/2012   Erectile dysfunction 11/06/2012   GERD (gastroesophageal reflux disease) 11/06/2012   DDD (degenerative disc disease), thoracolumbar 11/06/2012   Lactose intolerance 11/06/2012  Diverticulosis of colon without hemorrhage 11/06/2012   Diverticulosis of colon 11/06/2012   Degeneration of thoracolumbar intervertebral disc 11/06/2012   Gastroesophageal reflux disease 11/06/2012   Tobacco user 11/06/2012    Current Outpatient Medications:     apixaban  (ELIQUIS ) 5 MG TABS tablet, Take 1 tablet (5 mg total) by mouth 2 (two) times daily., Disp: 180 tablet, Rfl: 3   calcium  carbonate (OS-CAL - DOSED IN MG OF ELEMENTAL CALCIUM ) 1250 (500 Ca) MG tablet, Take 1 tablet (500 mg of elemental calcium  total) by mouth daily with breakfast., Disp: 30 tablet, Rfl: 11   carvedilol  (COREG ) 6.25 MG tablet, Take 1 tablet (6.25 mg total) by mouth 2 (two) times daily with a meal., Disp: 180 tablet, Rfl: 3   Cholecalciferol  (VITAMIN D -3) 125 MCG (5000 UT) TABS, Take 1 tablet by mouth daily., Disp: 30 tablet, Rfl: 11   ezetimibe  (ZETIA ) 10 MG tablet, Take 1 tablet (10 mg total) by mouth daily., Disp: 90 tablet, Rfl: 3   FARXIGA  10 MG TABS tablet, Take 1 tablet (10 mg total) by mouth daily., Disp: 90 tablet, Rfl: 3   ferrous sulfate  325 (65 FE) MG tablet, Take 325 mg by mouth daily with breakfast., Disp: , Rfl:    folic acid  (FOLVITE ) 1 MG tablet, Take 1 tablet (1 mg total) by mouth daily., Disp: 30 tablet, Rfl: 5   gabapentin  (NEURONTIN ) 300 MG capsule, TAKE 3 CAPSULES(900 MG) BY MOUTH DAILY, Disp: 270 capsule, Rfl: 1   methotrexate  (RHEUMATREX) 2.5 MG tablet, TAKE 20MG (8 TABLETS) EVERY WEDNESDAY. PROTECT FROM LIGHT AS DIRECTED, Disp: 150 tablet, Rfl: 0   Multiple Vitamin (MULTIVITAMIN) tablet, Take 1 tablet by mouth daily., Disp: , Rfl:    mupirocin  ointment (BACTROBAN ) 2 %, Apply 1 Application topically daily., Disp: 22 g, Rfl: 3   pantoprazole  (PROTONIX ) 40 MG tablet, Take 1 tablet (40 mg total) by mouth daily. 30 min before food, Disp: 30 tablet, Rfl: 6   sacubitril -valsartan  (ENTRESTO ) 24-26 MG, Take 1 tablet by mouth 2 (two) times daily., Disp: 180 tablet, Rfl: 3   spironolactone  (ALDACTONE ) 25 MG tablet, Take 1 tablet (25 mg total) by mouth daily., Disp: 90 tablet, Rfl: 3 Allergies  Allergen Reactions   Crestor  [Rosuvastatin  Calcium ] Other (See Comments)    Elevated muscle enzymes    Lipitor [Atorvastatin  Calcium ] Other (See Comments)    Elevated  muscle enzymes      Social History   Socioeconomic History   Marital status: Married    Spouse name: John Keith   Number of children: 2   Years of education: Not on file   Highest education level: High school graduate  Occupational History   Occupation: Retired    Comment: Semi  Tobacco Use   Smoking status: Former    Current packs/day: 0.00    Average packs/day: 0.5 packs/day for 49.0 years (24.5 ttl pk-yrs)    Types: Cigarettes    Start date: 07/06/1971    Quit date: 06/26/2020    Years since quitting: 3.4   Smokeless tobacco: Never   Tobacco comments:    Former smoker 09/24/21  Vaping Use   Vaping status: Never Used  Substance and Sexual Activity   Alcohol use: No    Alcohol/week: 0.0 standard drinks of alcohol   Drug use: No   Sexual activity: Not on file  Other Topics Concern   Not on file  Social History Narrative   Married    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    On child    1  son, 1 daughter   Machine op   Owns guns    Wears seat belt, safe in relationship    Smoker    Retired 06/27/2019   Social Drivers of [employer]: Low Risk  (11/22/2022)   Overall Financial Resource Strain (CARDIA)    Difficulty of Paying Living Expenses: Not hard at all  Food Insecurity: No Food Insecurity (11/22/2022)   Hunger Vital Sign    Worried About Running Out of Food in the Last Year: Never true    Ran Out of Food in the Last Year: Never true  Transportation Needs: No Transportation Needs (11/22/2022)   PRAPARE - Administrator, Civil Service (Medical): No    Lack of Transportation (Non-Medical): No  Physical Activity: Sufficiently Active (11/22/2022)   Exercise Vital Sign    Days of Exercise per Week: 3 days    Minutes of Exercise per Session: 60 min  Stress: No Stress Concern Present (11/22/2022)   [employer] of Occupational Health - Occupational Stress Questionnaire    Feeling of Stress : Not at all  Social Connections: Socially Integrated  (11/22/2022)   Social Connection and Isolation Panel [NHANES]    Frequency of Communication with Friends and Family: Once a week    Frequency of Social Gatherings with Friends and Family: More than three times a week    Attends Religious Services: More than 4 times per year    Active Member of [employer] or Organizations: Yes    Attends Banker Meetings: Never    Marital Status: Married  Catering manager Violence: Not At Risk (11/22/2022)   Humiliation, Afraid, Rape, and Kick questionnaire    Fear of Current or Ex-Partner: No    Emotionally Abused: No    Physically Abused: No    Sexually Abused: No    Physical Exam      Future Appointments  Date Time Provider Department Center  11/23/2023  8:10 AM [facility] ANNUAL WELLNESS VISIT [facility]  11/24/2023  9:15 AM McCaughan, Dia D, DPM [facility]  12/09/2023  7:00 AM [facility] DEVICE REMOTES [facility]  02/27/2024  8:40 AM Jeffie Mingle, MD [facility]  03/09/2024  7:00 AM [facility] DEVICE REMOTES [facility]  06/08/2024  7:00 AM [facility] DEVICE REMOTES [facility]  06/18/2024 10:00 AM Bair, Randa Burton, MD [facility]

## 2023-11-23 ENCOUNTER — Ambulatory Visit: Payer: PPO

## 2023-11-24 ENCOUNTER — Ambulatory Visit: Payer: PPO | Admitting: Podiatry

## 2023-11-24 DIAGNOSIS — M79675 Pain in left toe(s): Secondary | ICD-10-CM | POA: Diagnosis not present

## 2023-11-24 DIAGNOSIS — B351 Tinea unguium: Secondary | ICD-10-CM | POA: Diagnosis not present

## 2023-11-24 DIAGNOSIS — M79674 Pain in right toe(s): Secondary | ICD-10-CM

## 2023-11-24 NOTE — Progress Notes (Unsigned)
 Subjective:  Patient ID: John Keith, male    DOB: 10/29/1950,  MRN: [id number]  John Keith presents to clinic today for:  Chief Complaint  Patient presents with   [facility]    RFC with out callous. Not diabetic and takes Elliquis.    Patient notes nails are thick, discolored, elongated and painful in shoegear when trying to ambulate.    PCP is Valli Gaw, MD.  Past Medical History:  Diagnosis Date   Allergy    Arthritis    Atrial flutter [facility])    Carpal tunnel syndrome on left    Chest pain of uncertain etiology 10/24/2019   COVID-19    07/07/19   DDD (degenerative disc disease), thoracolumbar    multilevel   Degenerative joint disease of left shoulder    02/2011    Diverticulosis    left colon (2008)    ED (erectile dysfunction)    02/2011    GERD (gastroesophageal reflux disease)    H. pylori infection    Hyperlipidemia    Hypertension    Insomnia    Lactose intolerance    Low back pain    PVC (premature ventricular contraction) 10/24/2019   Shingles    07/2018   Smoking    smoking since age 34 y.o   Toe fracture, left    4th in 2014   Vitamin D  deficiency    Past Surgical History:  Procedure Laterality Date   APPENDECTOMY     BACK SURGERY     x 2 10991 and 1995 in [place]    CARDIAC CATHETERIZATION     1999 nl   CARDIOVERSION N/A 10/19/2021   Procedure: CARDIOVERSION;  Surgeon: Jann Melody, MD;  Location: [facility] ENDOSCOPY;  Service: Cardiovascular;  Laterality: N/A;   CARPAL TUNNEL RELEASE Left    COLONOSCOPY     2008 diverticulosis   ESOPHAGOGASTRODUODENOSCOPY     h/o +h. pylori   ICD IMPLANT N/A 12/10/2021   Procedure: ICD IMPLANT;  Surgeon: Lei Pump, MD;  Location: [facility] INVASIVE CV LAB;  Service: Cardiovascular;  Laterality: N/A;   LEFT HEART CATH AND CORONARY ANGIOGRAPHY N/A 12/08/2021   Procedure: LEFT HEART CATH AND CORONARY ANGIOGRAPHY;  Surgeon: Lucendia Rusk, MD;  Location: [facility] INVASIVE CV LAB;  Service: Cardiovascular;   Laterality: N/A;   OTHER SURGICAL HISTORY     heart catherization 1999 normal    SPINE SURGERY     x 2    Allergies  Allergen Reactions   Crestor  [Rosuvastatin  Calcium ] Other (See Comments)    Elevated muscle enzymes    Lipitor [Atorvastatin  Calcium ] Other (See Comments)    Elevated muscle enzymes     Review of Systems: Negative except as noted in the HPI.  Objective:  John Keith is a pleasant 73 y.o. male in NAD. AAO x 3.  Vascular Examination: Capillary refill time is 3-5 seconds to toes bilateral. Palpable pedal pulses b/l LE. Digital hair present b/l.  Skin temperature gradient WNL b/l. No varicosities b/l. No cyanosis noted b/l.   Dermatological Examination: Pedal skin with normal turgor, texture and tone b/l. No open wounds. No interdigital macerations b/l. Toenails x10 are 3mm thick, discolored, dystrophic with subungual debris. There is pain with compression of the nail plates.  They are elongated x10  Assessment/Plan: 1. Pain due to onychomycosis of toenails of both feet    The mycotic toenails were sharply debrided x10 with sterile nail nippers and a power debriding burr to  decrease bulk/thickness and length.    Return in about 3 months (around 02/24/2024) for [facility].   Joe Murders, DPM, FACFAS [facility]     [address] [place], [place] [address]                Office [phone]  Fax [phone] x 10

## 2023-11-25 ENCOUNTER — Other Ambulatory Visit (HOSPITAL_COMMUNITY): Payer: Self-pay | Admitting: Physician Assistant

## 2023-12-01 ENCOUNTER — Telehealth (HOSPITAL_COMMUNITY): Payer: Self-pay

## 2023-12-01 ENCOUNTER — Other Ambulatory Visit (HOSPITAL_COMMUNITY): Payer: Self-pay

## 2023-12-01 NOTE — Telephone Encounter (Signed)
 HF Paramedicine Message to [facility]  Pharmacy (if applicable): [facility]    Issue/reason for call: refill   Medication refill? Yes- send folic acid  to [facility] [place]

## 2023-12-01 NOTE — Progress Notes (Signed)
 Paramedicine Encounter    Patient ID: John Keith, male    DOB: 19-Apr-1951, 73 y.o.   MRN: [id number]   Complaints- chronic back pain    Assessment- CAOx4, warm and dry seated at kitchen table reporting to be feeling well aside from back pain-   Compliance with meds- no missed doses   Pill box filled- for one week   Refills needed- folic acid , eliquis    Meds changes since last visit- none     Social changes- none    VISIT SUMMARY- Arrived for home visit for Khyson who reports to be feeling well with some chronic back pain but denied any shortness of breath, dizziness, chest pain. No lower leg swelling noted. Lungs clear. Vitals obtained as noted. I reviewed meds and confirmed doses- pill box filled for one week. [facility]ills [facility]. We reviewed upcoming appointments and scheduled his visit with Dr. Ebbie Goldmann for Monday at 815. Home visit complete. I will see him in one week.   BP 102/60   Pulse 76   Resp 16   Wt 238 lb (108 kg)   SpO2 96%   BMI 25.50 kg/m  Weight yesterday-- 238.8lbs  Last visit weight-- 237lbs      ACTION: Home visit completed     Patient Care Team: Valli Gaw, MD as PCP - General (Family Medicine) Lei Pump, MD as PCP - Electrophysiology (Cardiology) [name], Rheta Celestine, MD as PCP - Cardiology (Cardiology) Colene Dauphin, MD as Consulting Physician (Internal Medicine) Maudine Sos, MD as Attending Physician (Cardiology) Lei Pump, MD as Consulting Physician (Cardiology)  Patient Active Problem List   Diagnosis Date Noted   On anticoagulant therapy 08/29/2023   On amiodarone  therapy 08/29/2023   Dermatitis 01/31/2023   Onychomycosis 01/31/2023   Statin myopathy 11/15/2022   Abnormal glucose 11/15/2022   Prostate cancer screening 11/15/2022   Chronic systolic heart failure (HCC) 12/15/2021   Chronic combined systolic and diastolic CHF (congestive heart failure) (HCC) 12/15/2021   Nasal sore 12/15/2021    Syncope 12/07/2021   Ventricular tachycardia (HCC) 12/07/2021   Typical atrial flutter (HCC) 09/24/2021   Secondary hypercoagulable state (HCC) 09/24/2021   Abnormal x-ray of neck 06/16/2021   Abnormal MRI, lumbar spine 10/23/2020   Chronic low back pain 10/23/2020   Neuropathy 10/23/2020   Coronary artery disease of native artery of native heart with stable angina pectoris (HCC) 10/23/2020   Impingement syndrome of left shoulder region 08/22/2020   Pain in joint of left shoulder 07/24/2020   Chronic left shoulder pain 07/11/2020   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   Anemia 12/13/2019   Chest pain of uncertain etiology 10/24/2019   PVC (premature ventricular contraction) 10/24/2019   Gastric ulcer without hemorrhage or perforation 09/28/2019   COVID-19 virus detected 07/12/2019   Elevated troponin 07/07/2019   Elevated serum protein level 07/07/2019   Postherpetic neuralgia 08/17/2018   Benign prostatic hyperplasia 08/01/2018   Hip pain 06/29/2018   Aortic atherosclerosis (HCC) 09/22/2016   Fatigue 06/03/2016   Lower urinary tract symptoms (LUTS) 06/03/2016   Constipation 03/10/2016   Lumbar radiculopathy 02/20/2016   Encounter for immunization 11/26/2015   Insomnia 06/10/2015   External hemorrhoid 03/07/2015   Health care maintenance 01/09/2014   Carpal tunnel syndrome of left wrist 01/03/2013   Hyperlipidemia 11/09/2012   Vitamin D  deficiency 11/09/2012   Essential hypertension 11/06/2012   Tobacco abuse 11/06/2012   Seasonal allergies 11/06/2012   Erectile dysfunction 11/06/2012   GERD (gastroesophageal reflux disease) 11/06/2012  DDD (degenerative disc disease), thoracolumbar 11/06/2012   Lactose intolerance 11/06/2012   Diverticulosis of colon without hemorrhage 11/06/2012   Diverticulosis of colon 11/06/2012   Degeneration of thoracolumbar intervertebral disc 11/06/2012   Gastroesophageal reflux disease 11/06/2012   Tobacco user 11/06/2012    Current  Outpatient Medications:    apixaban  (ELIQUIS ) 5 MG TABS tablet, Take 1 tablet (5 mg total) by mouth 2 (two) times daily., Disp: 180 tablet, Rfl: 3   calcium  carbonate (OS-CAL - DOSED IN MG OF ELEMENTAL CALCIUM ) 1250 (500 Ca) MG tablet, Take 1 tablet (500 mg of elemental calcium  total) by mouth daily with breakfast., Disp: 30 tablet, Rfl: 11   carvedilol  (COREG ) 6.25 MG tablet, Take 1 tablet (6.25 mg total) by mouth 2 (two) times daily with a meal., Disp: 180 tablet, Rfl: 3   Cholecalciferol  (VITAMIN D -3) 125 MCG (5000 UT) TABS, Take 1 tablet by mouth daily., Disp: 30 tablet, Rfl: 11   ezetimibe  (ZETIA ) 10 MG tablet, Take 1 tablet (10 mg total) by mouth daily., Disp: 90 tablet, Rfl: 3   FARXIGA  10 MG TABS tablet, Take 1 tablet (10 mg total) by mouth daily., Disp: 90 tablet, Rfl: 3   ferrous sulfate  325 (65 FE) MG tablet, Take 325 mg by mouth daily with breakfast., Disp: , Rfl:    folic acid  (FOLVITE ) 1 MG tablet, Take 1 tablet (1 mg total) by mouth daily., Disp: 30 tablet, Rfl: 5   gabapentin  (NEURONTIN ) 300 MG capsule, TAKE 3 CAPSULES(900 MG) BY MOUTH DAILY, Disp: 270 capsule, Rfl: 1   methotrexate  (RHEUMATREX) 2.5 MG tablet, TAKE 20MG (8 TABLETS) EVERY WEDNESDAY. PROTECT FROM LIGHT AS DIRECTED, Disp: 150 tablet, Rfl: 0   Multiple Vitamin (MULTIVITAMIN) tablet, Take 1 tablet by mouth daily., Disp: , Rfl:    mupirocin  ointment (BACTROBAN ) 2 %, Apply 1 Application topically daily., Disp: 22 g, Rfl: 3   pantoprazole  (PROTONIX ) 40 MG tablet, Take 1 tablet (40 mg total) by mouth daily. 30 min before food, Disp: 30 tablet, Rfl: 6   sacubitril -valsartan  (ENTRESTO ) 24-26 MG, Take 1 tablet by mouth 2 (two) times daily., Disp: 180 tablet, Rfl: 3   spironolactone  (ALDACTONE ) 25 MG tablet, Take 1 tablet (25 mg total) by mouth daily., Disp: 90 tablet, Rfl: 3 Allergies  Allergen Reactions   Crestor  [Rosuvastatin  Calcium ] Other (See Comments)    Elevated muscle enzymes    Lipitor [Atorvastatin  Calcium ] Other  (See Comments)    Elevated muscle enzymes      Social History   Socioeconomic History   Marital status: Married    Spouse name: Gwendolyn   Number of children: 2   Years of education: Not on file   Highest education level: High school graduate  Occupational History   Occupation: Retired    Comment: Semi  Tobacco Use   Smoking status: Former    Current packs/day: 0.00    Average packs/day: 0.5 packs/day for 49.0 years (24.5 ttl pk-yrs)    Types: Cigarettes    Start date: 07/06/1971    Quit date: 06/26/2020    Years since quitting: 3.4   Smokeless tobacco: Never   Tobacco comments:    Former smoker 09/24/21  Vaping Use   Vaping status: Never Used  Substance and Sexual Activity   Alcohol use: No    Alcohol/week: 0.0 standard drinks of alcohol   Drug use: No   Sexual activity: Not on file  Other Topics Concern   Not on file  Social History Narrative   Married  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    On child    1 son, 1 daughter   Machine op   Owns guns    Wears seat belt, safe in relationship    Smoker    Retired 06/27/2019   Social Drivers of [employer] Strain: Low Risk  (11/22/2022)   Overall Financial Resource Strain (CARDIA)    Difficulty of Paying Living Expenses: Not hard at all  Food Insecurity: No Food Insecurity (11/22/2022)   Hunger Vital Sign    Worried About Running Out of Food in the Last Year: Never true    Ran Out of Food in the Last Year: Never true  Transportation Needs: No Transportation Needs (11/22/2022)   PRAPARE - Administrator, Civil Service (Medical): No    Lack of Transportation (Non-Medical): No  Physical Activity: Sufficiently Active (11/22/2022)   Exercise Vital Sign    Days of Exercise per Week: 3 days    Minutes of Exercise per Session: 60 min  Stress: No Stress Concern Present (11/22/2022)   [employer] of Occupational Health - Occupational Stress Questionnaire    Feeling of Stress : Not at all  Social Connections:  Socially Integrated (11/22/2022)   Social Connection and Isolation Panel [NHANES]    Frequency of Communication with Friends and Family: Once a week    Frequency of Social Gatherings with Friends and Family: More than three times a week    Attends Religious Services: More than 4 times per year    Active Member of [employer] or Organizations: Yes    Attends Banker Meetings: Never    Marital Status: Married  Catering manager Violence: Not At Risk (11/22/2022)   Humiliation, Afraid, Rape, and Kick questionnaire    Fear of Current or Ex-Partner: No    Emotionally Abused: No    Physically Abused: No    Sexually Abused: No    Physical Exam      Future Appointments  Date Time Provider Department Center  12/09/2023  7:00 AM [facility] HVT DEVICE REMOTES [facility]  02/23/2024  9:15 AM McCaughan, Dia D, DPM [facility]  02/27/2024  8:40 AM Jeffie Mingle, MD [facility]  03/09/2024  7:00 AM [facility] HVT DEVICE REMOTES [facility]  06/08/2024  7:00 AM [facility] HVT DEVICE REMOTES [facility]  06/18/2024 10:00 AM Bair, Randa Burton, MD [facility]

## 2023-12-02 ENCOUNTER — Encounter: Payer: Self-pay | Admitting: Internal Medicine

## 2023-12-05 DIAGNOSIS — M5136 Other intervertebral disc degeneration, lumbar region with discogenic back pain only: Secondary | ICD-10-CM | POA: Diagnosis not present

## 2023-12-05 DIAGNOSIS — E663 Overweight: Secondary | ICD-10-CM | POA: Diagnosis not present

## 2023-12-05 DIAGNOSIS — Z6826 Body mass index (BMI) 26.0-26.9, adult: Secondary | ICD-10-CM | POA: Diagnosis not present

## 2023-12-05 DIAGNOSIS — D8685 Sarcoid myocarditis: Secondary | ICD-10-CM | POA: Diagnosis not present

## 2023-12-05 DIAGNOSIS — M1991 Primary osteoarthritis, unspecified site: Secondary | ICD-10-CM | POA: Diagnosis not present

## 2023-12-05 DIAGNOSIS — Z79899 Other long term (current) drug therapy: Secondary | ICD-10-CM | POA: Diagnosis not present

## 2023-12-05 DIAGNOSIS — G5603 Carpal tunnel syndrome, bilateral upper limbs: Secondary | ICD-10-CM | POA: Diagnosis not present

## 2023-12-08 ENCOUNTER — Telehealth (HOSPITAL_COMMUNITY): Payer: Self-pay

## 2023-12-08 ENCOUNTER — Other Ambulatory Visit (HOSPITAL_COMMUNITY): Payer: Self-pay

## 2023-12-08 MED ORDER — FOLIC ACID 1 MG PO TABS: 1.0000 mg | ORAL_TABLET | Freq: Every day | ORAL | 6 refills | Status: DC

## 2023-12-08 MED ORDER — FOLIC ACID 1 MG PO TABS: 1.0000 mg | ORAL_TABLET | Freq: Every day | ORAL | 6 refills | Status: AC

## 2023-12-08 NOTE — Telephone Encounter (Signed)
 Refill sent.

## 2023-12-08 NOTE — Addendum Note (Signed)
 Addended by: Glorietta Lark on: 12/08/2023 10:35 AM   Modules accepted: Orders

## 2023-12-08 NOTE — Progress Notes (Signed)
 Paramedicine Encounter    Patient ID: John Keith, male    DOB: [birth year], 73 y.o.   MRN: [id number]   Complaints- none   Assessment- CAOX4, warm and dry, ambulatory without shortness of breath, denied chest pain, palpitations, dizziness. No edema noted. Lungs clear. Vitals within normal range.   Compliance with meds- no missed doses   Pill box filled- for three weeks   Refills needed- folic acid    Meds changes since last visit- none     Social changes- none    VISIT SUMMARY*- Arrived for home visit for John Keith who reports to be feeling well with no complaints. I obtained vitals and assessment. No edema. Lungs clear, no abdominal distention. No shortness of breath, no dizziness, no chest pain or palpitations. He reports urinating well with no issues. HF education provided on sodium and fluid intakes. No issues with med compliance. I reviewed meds and filled pill box for three weeks as I will be out of office. Our next visit is June 26th. He agreed with plan and has clinic, [facility] numbers if a need arises. Home visit complete.    BP 108/62   Pulse 72   Resp 16   Wt 238 lb 9.6 oz (108.2 kg)   SpO2 94%   BMI 25.57 kg/m  Weight yesterday-- 239lbs  Last visit weight-- 238lbs      ACTION: Home visit completed     Patient Care Team: Valli Gaw, MD as PCP - General (Family Medicine) Lei Pump, MD as PCP - Electrophysiology (Cardiology) [name], Rheta Celestine, MD as PCP - Cardiology (Cardiology) Colene Dauphin, MD as Consulting Physician (Internal Medicine) Maudine Sos, MD as Attending Physician (Cardiology) Lei Pump, MD as Consulting Physician (Cardiology)  Patient Active Problem List   Diagnosis Date Noted   On anticoagulant therapy 08/29/2023   On amiodarone  therapy 08/29/2023   Dermatitis 01/31/2023   Onychomycosis 01/31/2023   Statin myopathy 11/15/2022   Abnormal glucose 11/15/2022   Prostate cancer screening 11/15/2022    Chronic systolic heart failure (HCC) 12/15/2021   Chronic combined systolic and diastolic CHF (congestive heart failure) (HCC) 12/15/2021   Nasal sore 12/15/2021   Syncope 12/07/2021   Ventricular tachycardia (HCC) 12/07/2021   Typical atrial flutter (HCC) 09/24/2021   Secondary hypercoagulable state (HCC) 09/24/2021   Abnormal x-ray of neck 06/16/2021   Abnormal MRI, lumbar spine 10/23/2020   Chronic low back pain 10/23/2020   Neuropathy 10/23/2020   Coronary artery disease of native artery of native heart with stable angina pectoris (HCC) 10/23/2020   Impingement syndrome of left shoulder region 08/22/2020   Pain in joint of left shoulder 07/24/2020   Chronic left shoulder pain 07/11/2020   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   Anemia 12/13/2019   Chest pain of uncertain etiology 10/24/2019   PVC (premature ventricular contraction) 10/24/2019   Gastric ulcer without hemorrhage or perforation 09/28/2019   COVID-19 virus detected 07/12/2019   Elevated troponin 07/07/2019   Elevated serum protein level 07/07/2019   Postherpetic neuralgia 08/17/2018   Benign prostatic hyperplasia 08/01/2018   Hip pain 06/29/2018   Aortic atherosclerosis (HCC) 09/22/2016   Fatigue 06/03/2016   Lower urinary tract symptoms (LUTS) 06/03/2016   Constipation 03/10/2016   Lumbar radiculopathy 02/20/2016   Encounter for immunization 11/26/2015   Insomnia 06/10/2015   External hemorrhoid 03/07/2015   Health care maintenance 01/09/2014   Carpal tunnel syndrome of left wrist 01/03/2013   Hyperlipidemia 11/09/2012   Vitamin D  deficiency 11/09/2012  Essential hypertension 11/06/2012   Tobacco abuse 11/06/2012   Seasonal allergies 11/06/2012   Erectile dysfunction 11/06/2012   GERD (gastroesophageal reflux disease) 11/06/2012   DDD (degenerative disc disease), thoracolumbar 11/06/2012   Lactose intolerance 11/06/2012   Diverticulosis of colon without hemorrhage 11/06/2012   Diverticulosis of colon  11/06/2012   Degeneration of thoracolumbar intervertebral disc 11/06/2012   Gastroesophageal reflux disease 11/06/2012   Tobacco user 11/06/2012    Current Outpatient Medications:    apixaban  (ELIQUIS ) 5 MG TABS tablet, Take 1 tablet (5 mg total) by mouth 2 (two) times daily., Disp: 180 tablet, Rfl: 3   calcium  carbonate (OS-CAL - DOSED IN MG OF ELEMENTAL CALCIUM ) 1250 (500 Ca) MG tablet, Take 1 tablet (500 mg of elemental calcium  total) by mouth daily with breakfast., Disp: 30 tablet, Rfl: 11   carvedilol  (COREG ) 6.25 MG tablet, Take 1 tablet (6.25 mg total) by mouth 2 (two) times daily with a meal., Disp: 180 tablet, Rfl: 3   Cholecalciferol  (VITAMIN D -3) 125 MCG (5000 UT) TABS, Take 1 tablet by mouth daily., Disp: 30 tablet, Rfl: 11   ezetimibe  (ZETIA ) 10 MG tablet, Take 1 tablet (10 mg total) by mouth daily., Disp: 90 tablet, Rfl: 3   FARXIGA  10 MG TABS tablet, Take 1 tablet (10 mg total) by mouth daily., Disp: 90 tablet, Rfl: 3   ferrous sulfate  325 (65 FE) MG tablet, Take 325 mg by mouth daily with breakfast., Disp: , Rfl:    folic acid  (FOLVITE ) 1 MG tablet, Take 1 tablet (1 mg total) by mouth daily., Disp: 30 tablet, Rfl: 5   gabapentin  (NEURONTIN ) 300 MG capsule, TAKE 3 CAPSULES(900 MG) BY MOUTH DAILY, Disp: 270 capsule, Rfl: 1   methotrexate  (RHEUMATREX) 2.5 MG tablet, TAKE 20MG (8 TABLETS) EVERY WEDNESDAY. PROTECT FROM LIGHT AS DIRECTED, Disp: 150 tablet, Rfl: 0   Multiple Vitamin (MULTIVITAMIN) tablet, Take 1 tablet by mouth daily., Disp: , Rfl:    mupirocin  ointment (BACTROBAN ) 2 %, Apply 1 Application topically daily., Disp: 22 g, Rfl: 3   pantoprazole  (PROTONIX ) 40 MG tablet, Take 1 tablet (40 mg total) by mouth daily. 30 min before food, Disp: 30 tablet, Rfl: 6   sacubitril -valsartan  (ENTRESTO ) 24-26 MG, Take 1 tablet by mouth 2 (two) times daily., Disp: 180 tablet, Rfl: 3   spironolactone  (ALDACTONE ) 25 MG tablet, Take 1 tablet (25 mg total) by mouth daily., Disp: 90 tablet, Rfl:  3 Allergies  Allergen Reactions   Crestor  [Rosuvastatin  Calcium ] Other (See Comments)    Elevated muscle enzymes    Lipitor [Atorvastatin  Calcium ] Other (See Comments)    Elevated muscle enzymes      Social History   Socioeconomic History   Marital status: Married    Spouse name: Gwendolyn   Number of children: 2   Years of education: Not on file   Highest education level: High school graduate  Occupational History   Occupation: Retired    Comment: Semi  Tobacco Use   Smoking status: Former    Current packs/day: 0.00    Average packs/day: 0.5 packs/day for 49.0 years (24.5 ttl pk-yrs)    Types: Cigarettes    Start date: 07/06/1971    Quit date: 06/26/2020    Years since quitting: 3.4   Smokeless tobacco: Never   Tobacco comments:    Former smoker 09/24/21  Vaping Use   Vaping status: Never Used  Substance and Sexual Activity   Alcohol use: No    Alcohol/week: 0.0 standard drinks of alcohol   Drug  use: No   Sexual activity: Not on file  Other Topics Concern   Not on file  Social History Narrative   Married    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    On child    1 son, 1 daughter   Machine op   Owns guns    Wears seat belt, safe in relationship    Smoker    Retired 06/27/2019   Social Drivers of Health   Financial Resource Strain: Low Risk  (11/22/2022)   Overall Financial Resource Strain (CARDIA)    Difficulty of Paying Living Expenses: Not hard at all  Food Insecurity: No Food Insecurity (11/22/2022)   Hunger Vital Sign    Worried About Running Out of Food in the Last Year: Never true    Ran Out of Food in the Last Year: Never true  Transportation Needs: No Transportation Needs (11/22/2022)   PRAPARE - Administrator, Civil Service (Medical): No    Lack of Transportation (Non-Medical): No  Physical Activity: Sufficiently Active (11/22/2022)   Exercise Vital Sign    Days of Exercise per Week: 3 days    Minutes of Exercise per Session: 60 min  Stress: No Stress  Concern Present (11/22/2022)   [employer] of Occupational Health - Occupational Stress Questionnaire    Feeling of Stress : Not at all  Social Connections: Socially Integrated (11/22/2022)   Social Connection and Isolation Panel [NHANES]    Frequency of Communication with Friends and Family: Once a week    Frequency of Social Gatherings with Friends and Family: More than three times a week    Attends Religious Services: More than 4 times per year    Active Member of [employer] or Organizations: Yes    Attends Banker Meetings: Never    Marital Status: Married  Catering manager Violence: Not At Risk (11/22/2022)   Humiliation, Afraid, Rape, and Kick questionnaire    Fear of Current or Ex-Partner: No    Emotionally Abused: No    Physically Abused: No    Sexually Abused: No    Physical Exam      Future Appointments  Date Time Provider Department Center  12/09/2023  7:00 AM [facility] DEVICE [facility]OT[facility]  02/23/2024  9:15 AM McCaughan, Dia D, DPM [facility]  02/27/2024  8:40 AM Jeffie Mingle, MD [facility]  03/09/2024  7:00 AM [facility] DEVICE REMOT[facility]  06/08/2024  7:00 AM [facility] DEVICE [facility]  06/18/2024 10:00 AM Bair, Randa Burton, MD [facility]

## 2023-12-08 NOTE — Telephone Encounter (Signed)
 HF Paramedicine Message to [facility]  Pharmacy (if applicable): [facility]    Issue/reason for call:  Please send Folic acid  to [facility] in [place].  Roberts Ching, EMT-Paramedic [phone] 12/08/2023

## 2023-12-09 ENCOUNTER — Ambulatory Visit (INDEPENDENT_AMBULATORY_CARE_PROVIDER_SITE_OTHER): Payer: PPO

## 2023-12-09 DIAGNOSIS — I472 Ventricular tachycardia, unspecified: Secondary | ICD-10-CM

## 2023-12-10 LAB — CUP PACEART REMOTE DEVICE CHECK
Battery Remaining Longevity: 124 mo
Battery Voltage: 3.02 V
Brady Statistic RV Percent Paced: 0.1 %
Date Time Interrogation Session: 20250606043823
HighPow Impedance: 66 Ohm
Implantable Lead Connection Status: 753985
Implantable Lead Implant Date: 20230608
Implantable Lead Location: 753860
Implantable Pulse Generator Implant Date: 20230608
Lead Channel Impedance Value: 285 Ohm
Lead Channel Impedance Value: 342 Ohm
Lead Channel Pacing Threshold Amplitude: 0.75 V
Lead Channel Pacing Threshold Pulse Width: 0.4 ms
Lead Channel Sensing Intrinsic Amplitude: 9.875 mV
Lead Channel Sensing Intrinsic Amplitude: 9.875 mV
Lead Channel Setting Pacing Amplitude: 2 V
Lead Channel Setting Pacing Pulse Width: 0.4 ms
Lead Channel Setting Sensing Sensitivity: 0.3 mV
Zone Setting Status: 755011
Zone Setting Status: 755011

## 2023-12-15 DIAGNOSIS — M1990 Unspecified osteoarthritis, unspecified site: Secondary | ICD-10-CM | POA: Diagnosis not present

## 2023-12-15 DIAGNOSIS — M549 Dorsalgia, unspecified: Secondary | ICD-10-CM | POA: Diagnosis not present

## 2023-12-16 ENCOUNTER — Other Ambulatory Visit (HOSPITAL_COMMUNITY): Payer: Self-pay | Admitting: Internal Medicine

## 2023-12-18 ENCOUNTER — Inpatient Hospital Stay (HOSPITAL_COMMUNITY)

## 2023-12-18 ENCOUNTER — Emergency Department (HOSPITAL_COMMUNITY)

## 2023-12-18 ENCOUNTER — Inpatient Hospital Stay (HOSPITAL_COMMUNITY)
Admission: EM | Admit: 2023-12-18 | Discharge: 2023-12-22 | DRG: 689 | Disposition: A | Discharge status: Home Health | Attending: Internal Medicine | Admitting: Internal Medicine

## 2023-12-18 ENCOUNTER — Encounter (HOSPITAL_COMMUNITY): Payer: Self-pay

## 2023-12-18 ENCOUNTER — Other Ambulatory Visit: Payer: Self-pay

## 2023-12-18 DIAGNOSIS — R531 Weakness: Secondary | ICD-10-CM

## 2023-12-18 DIAGNOSIS — K769 Liver disease, unspecified: Secondary | ICD-10-CM | POA: Diagnosis not present

## 2023-12-18 DIAGNOSIS — Z79899 Other long term (current) drug therapy: Secondary | ICD-10-CM

## 2023-12-18 DIAGNOSIS — G9340 Encephalopathy, unspecified: Secondary | ICD-10-CM | POA: Diagnosis not present

## 2023-12-18 DIAGNOSIS — I4892 Unspecified atrial flutter: Secondary | ICD-10-CM | POA: Diagnosis not present

## 2023-12-18 DIAGNOSIS — T40425A Adverse effect of tramadol, initial encounter: Secondary | ICD-10-CM | POA: Diagnosis not present

## 2023-12-18 DIAGNOSIS — Z800 Family history of malignant neoplasm of digestive organs: Secondary | ICD-10-CM

## 2023-12-18 DIAGNOSIS — Z87891 Personal history of nicotine dependence: Secondary | ICD-10-CM

## 2023-12-18 DIAGNOSIS — N179 Acute kidney failure, unspecified: Secondary | ICD-10-CM | POA: Diagnosis present

## 2023-12-18 DIAGNOSIS — N400 Enlarged prostate without lower urinary tract symptoms: Secondary | ICD-10-CM | POA: Diagnosis not present

## 2023-12-18 DIAGNOSIS — R0602 Shortness of breath: Secondary | ICD-10-CM | POA: Diagnosis not present

## 2023-12-18 DIAGNOSIS — R410 Disorientation, unspecified: Principal | ICD-10-CM

## 2023-12-18 DIAGNOSIS — R7401 Elevation of levels of liver transaminase levels: Secondary | ICD-10-CM | POA: Diagnosis not present

## 2023-12-18 DIAGNOSIS — I6782 Cerebral ischemia: Secondary | ICD-10-CM | POA: Diagnosis not present

## 2023-12-18 DIAGNOSIS — A419 Sepsis, unspecified organism: Secondary | ICD-10-CM | POA: Diagnosis not present

## 2023-12-18 DIAGNOSIS — K820 Obstruction of gallbladder: Secondary | ICD-10-CM | POA: Diagnosis not present

## 2023-12-18 DIAGNOSIS — T428X5A Adverse effect of antiparkinsonism drugs and other central muscle-tone depressants, initial encounter: Secondary | ICD-10-CM | POA: Diagnosis not present

## 2023-12-18 DIAGNOSIS — Z8249 Family history of ischemic heart disease and other diseases of the circulatory system: Secondary | ICD-10-CM

## 2023-12-18 DIAGNOSIS — I10 Essential (primary) hypertension: Secondary | ICD-10-CM | POA: Diagnosis not present

## 2023-12-18 DIAGNOSIS — G928 Other toxic encephalopathy: Secondary | ICD-10-CM | POA: Diagnosis present

## 2023-12-18 DIAGNOSIS — Z7901 Long term (current) use of anticoagulants: Secondary | ICD-10-CM

## 2023-12-18 DIAGNOSIS — K7689 Other specified diseases of liver: Secondary | ICD-10-CM | POA: Diagnosis not present

## 2023-12-18 DIAGNOSIS — E739 Lactose intolerance, unspecified: Secondary | ICD-10-CM | POA: Diagnosis present

## 2023-12-18 DIAGNOSIS — I4891 Unspecified atrial fibrillation: Secondary | ICD-10-CM | POA: Diagnosis not present

## 2023-12-18 DIAGNOSIS — Z1152 Encounter for screening for COVID-19: Secondary | ICD-10-CM

## 2023-12-18 DIAGNOSIS — N390 Urinary tract infection, site not specified: Principal | ICD-10-CM | POA: Diagnosis present

## 2023-12-18 DIAGNOSIS — Z833 Family history of diabetes mellitus: Secondary | ICD-10-CM

## 2023-12-18 DIAGNOSIS — Z808 Family history of malignant neoplasm of other organs or systems: Secondary | ICD-10-CM

## 2023-12-18 DIAGNOSIS — R4182 Altered mental status, unspecified: Secondary | ICD-10-CM | POA: Diagnosis not present

## 2023-12-18 DIAGNOSIS — K219 Gastro-esophageal reflux disease without esophagitis: Secondary | ICD-10-CM | POA: Diagnosis not present

## 2023-12-18 DIAGNOSIS — K5730 Diverticulosis of large intestine without perforation or abscess without bleeding: Secondary | ICD-10-CM | POA: Diagnosis not present

## 2023-12-18 DIAGNOSIS — Z95810 Presence of automatic (implantable) cardiac defibrillator: Secondary | ICD-10-CM | POA: Diagnosis not present

## 2023-12-18 DIAGNOSIS — R509 Fever, unspecified: Secondary | ICD-10-CM | POA: Diagnosis not present

## 2023-12-18 HISTORY — DX: Encephalopathy, unspecified: G93.40

## 2023-12-18 LAB — COMPREHENSIVE METABOLIC PANEL WITH GFR
ALT: 67 U/L — ABNORMAL HIGH (ref 0–44)
AST: 83 U/L — ABNORMAL HIGH (ref 15–41)
Albumin: 3.4 g/dL — ABNORMAL LOW (ref 3.5–5.0)
Alkaline Phosphatase: 60 U/L (ref 38–126)
Anion gap: 12 (ref 5–15)
BUN: 17 mg/dL (ref 8–23)
CO2: 23 mmol/L (ref 22–32)
Calcium: 9.2 mg/dL (ref 8.9–10.3)
Chloride: 98 mmol/L (ref 98–111)
Creatinine, Ser: 1.35 mg/dL — ABNORMAL HIGH (ref 0.61–1.24)
GFR, Estimated: 55 mL/min — ABNORMAL LOW (ref >60)
Glucose, Bld: 118 mg/dL — ABNORMAL HIGH (ref 70–99)
Potassium: 4.1 mmol/L (ref 3.5–5.1)
Sodium: 133 mmol/L — ABNORMAL LOW (ref 135–145)
Total Bilirubin: 1.1 mg/dL (ref 0.0–1.2)
Total Protein: 7.8 g/dL (ref 6.5–8.1)

## 2023-12-18 LAB — URINALYSIS, ROUTINE W REFLEX MICROSCOPIC
Bilirubin Urine: NEGATIVE
Glucose, UA: >=500 mg/dL — AB
Ketones, ur: 5 mg/dL — AB
Nitrite: NEGATIVE
Protein, ur: 30 mg/dL — AB
Specific Gravity, Urine: 1.032 — ABNORMAL HIGH (ref 1.005–1.030)
pH: 5 (ref 5.0–8.0)

## 2023-12-18 LAB — CBC
HCT: 37.5 % — ABNORMAL LOW (ref 39.0–52.0)
HCT: 37.2 % — ABNORMAL LOW (ref 39.0–52.0)
Hemoglobin: 12.2 g/dL — ABNORMAL LOW (ref 13.0–17.0)
Hemoglobin: 12.2 g/dL — ABNORMAL LOW (ref 13.0–17.0)
MCH: 32.3 pg (ref 26.0–34.0)
MCH: 32.4 pg (ref 26.0–34.0)
MCHC: 32.5 g/dL (ref 30.0–36.0)
MCHC: 32.8 g/dL (ref 30.0–36.0)
MCV: 99.2 fL (ref 80.0–100.0)
MCV: 98.9 fL (ref 80.0–100.0)
Platelets: 281 10*3/uL (ref 150–400)
Platelets: 295 10*3/uL (ref 150–400)
RBC: 3.78 MIL/uL — ABNORMAL LOW (ref 4.22–5.81)
RBC: 3.76 MIL/uL — ABNORMAL LOW (ref 4.22–5.81)
RDW: 13.6 % (ref 11.5–15.5)
RDW: 13.8 % (ref 11.5–15.5)
WBC: 10.5 10*3/uL (ref 4.0–10.5)
WBC: 10.6 10*3/uL — ABNORMAL HIGH (ref 4.0–10.5)
nRBC: 0 % (ref 0.0–0.2)
nRBC: 0 % (ref 0.0–0.2)

## 2023-12-18 LAB — RESP PANEL BY RT-PCR (RSV, FLU A&B, COVID)  RVPGX2
Influenza A by PCR: NEGATIVE
Influenza B by PCR: NEGATIVE
Resp Syncytial Virus by PCR: NEGATIVE
SARS Coronavirus 2 by RT PCR: NEGATIVE

## 2023-12-18 LAB — CREATININE, SERUM
Creatinine, Ser: 1.27 mg/dL — ABNORMAL HIGH (ref 0.61–1.24)
GFR, Estimated: 59 mL/min — ABNORMAL LOW (ref >60)

## 2023-12-18 LAB — TROPONIN I (HIGH SENSITIVITY)
Troponin I (High Sensitivity): 32 ng/L — ABNORMAL HIGH (ref <18)
Troponin I (High Sensitivity): 36 ng/L — ABNORMAL HIGH (ref <18)

## 2023-12-18 LAB — BRAIN NATRIURETIC PEPTIDE: B Natriuretic Peptide: 242.4 pg/mL — ABNORMAL HIGH (ref 0.0–100.0)

## 2023-12-18 LAB — CBG MONITORING, ED: Glucose-Capillary: 129 mg/dL — ABNORMAL HIGH (ref 70–99)

## 2023-12-18 MED ORDER — SODIUM CHLORIDE 0.9 % IV SOLN: 1.0000 g | INTRAVENOUS | Status: DC

## 2023-12-18 MED ORDER — SACUBITRIL-VALSARTAN 24-26 MG PO TABS: 1.0000 | ORAL_TABLET | Freq: Two times a day (BID) | ORAL | Status: DC

## 2023-12-18 MED ORDER — SODIUM CHLORIDE 0.9 % IV SOLN
1.0000 g | Freq: Once | INTRAVENOUS | Status: AC
  Administered 2023-12-18: 1 g via INTRAVENOUS
  Filled 2023-12-18: qty 10

## 2023-12-18 MED ORDER — CARVEDILOL 6.25 MG PO TABS
6.2500 mg | ORAL_TABLET | Freq: Two times a day (BID) | ORAL | Status: DC
  Administered 2023-12-18 – 2023-12-22 (×8): 6.25 mg via ORAL
  Filled 2023-12-18 (×8): qty 1

## 2023-12-18 MED ORDER — FUROSEMIDE 10 MG/ML IJ SOLN
40.0000 mg | Freq: Two times a day (BID) | INTRAMUSCULAR | Status: AC
  Administered 2023-12-18 – 2023-12-19 (×3): 40 mg via INTRAVENOUS
  Filled 2023-12-18 (×3): qty 4

## 2023-12-18 MED ORDER — APIXABAN 5 MG PO TABS
5.0000 mg | ORAL_TABLET | Freq: Two times a day (BID) | ORAL | Status: DC
  Administered 2023-12-18 – 2023-12-22 (×8): 5 mg via ORAL
  Filled 2023-12-18 (×8): qty 1

## 2023-12-18 MED ORDER — LACTATED RINGERS IV SOLN: INTRAVENOUS | Status: AC

## 2023-12-18 MED ORDER — ENOXAPARIN SODIUM 40 MG/0.4ML IJ SOSY: 40.0000 mg | PREFILLED_SYRINGE | INTRAMUSCULAR | Status: DC

## 2023-12-18 MED ORDER — PANTOPRAZOLE SODIUM 40 MG PO TBEC
40.0000 mg | DELAYED_RELEASE_TABLET | Freq: Every day | ORAL | Status: DC
  Administered 2023-12-19 – 2023-12-22 (×4): 40 mg via ORAL
  Filled 2023-12-18 (×5): qty 1

## 2023-12-18 NOTE — H&P (Signed)
 History and Physical    Patient: John Keith UJW:[id number] DOB: 1951/03/03 DOA: 12/18/2023 DOS: the patient was seen and examined on 12/18/2023 PCP: Valli Gaw, MD  Patient coming from: Home  Chief Complaint:  Chief Complaint  Patient presents with   Weakness   HPI: John Keith is a 73 y.o. male with medical history significant of Aflutter, HTN, and DDD p/w acute encephalopathy.  Pt states that he was in his USOH until this past Tuesday when he started having back pain, fatigue, and decreased appetite. He was evaluated at urgent care on Wednesday and given tramadol  and robaxin for his back pain, which he has taken daily. Since starting these medications, he has become increasingly confused; as such, he presented to the ED today at the behest of his wife.  In the ED, pt febrile and tachypneic on RA. Labs notable for Na 133, Cr 1.35, BNP 242, WBC 10.5, and UA positive for LE, but neg for nitrite. CXR wnl. CTH NAICA. Pt admitted to medicine for ongoing w/u.  Review of Systems: As mentioned in the history of present illness. All other systems reviewed and are negative. Past Medical History:  Diagnosis Date   Allergy    Arthritis    Atrial flutter (HCC)    Carpal tunnel syndrome on left    Chest pain of uncertain etiology 10/24/2019   COVID-19    07/07/19   DDD (degenerative disc disease), thoracolumbar    multilevel   Degenerative joint disease of left shoulder    02/2011    Diverticulosis    left colon (2008)    ED (erectile dysfunction)    02/2011    GERD (gastroesophageal reflux disease)    H. pylori infection    Hyperlipidemia    Hypertension    Insomnia    Lactose intolerance    Low back pain    PVC (premature ventricular contraction) 10/24/2019   Shingles    07/2018   Smoking    smoking since age 55 y.o   Toe fracture, left    4th in 2014   Vitamin D  deficiency    Past Surgical History:  Procedure Laterality Date   APPENDECTOMY     BACK SURGERY     x 2  [address] and 1995 in [place]    CARDIAC CATHETERIZATION     1999 nl   CARDIOVERSION N/A 10/19/2021   Procedure: CARDIOVERSION;  Surgeon: Jann Melody, MD;  Location: [facility] END[facility]COPY;  Service: Cardiovascular;  Laterality: N/A;   CARPAL TUNNEL RELEASE Left    COLONOSCOPY     2008 diverticulosis   ESOPHAGOGASTRODUODENOSCOPY     h/o +h. pylori   ICD IMPLANT N/A 12/10/2021   Procedure: ICD IMPLANT;  Surgeon: Lei Pump, MD;  Location: [facility] INVASIVE CV LAB;  Service: Cardiovascular;  Laterality: N/A;   LEFT HEART CATH AND CORONARY ANGIOGRAPHY N/A 12/08/2021   Procedure: LEFT HEART CATH AND CORONARY ANGIOGRAPHY;  Surgeon: Lucendia Rusk, MD;  Location: [facility] INVASIVE CV LAB;  Service: Cardiovascular;  Laterality: N/A;   OTHER SURGICAL HISTORY     heart catherization 1999 normal    SPINE SURGERY     x 2    Social History:  reports that he quit smoking about 3 years ago. His smoking use included cigarettes. He started smoking about 52 years ago. He has a 24.5 pack-year smoking history. He has never used smokeless tobacco. He reports that he does not drink alcohol and does not use drugs.  Allergies  Allergen Reactions   Crestor  [Rosuvastatin  Calcium ] Other (See Comments)    Elevated muscle enzymes    Lipitor [Atorvastatin  Calcium ] Other (See Comments)    Elevated muscle enzymes     Family History  Problem Relation Age of Onset   Heart disease Mother        age 19    Throat cancer Father 66   Cancer Father        throat    Cancer Maternal Uncle        uncle ?maternal or paternal died colon cancer in his 26s    Diabetes Maternal Aunt    Heart attack Maternal Grandmother     Prior to Admission medications   Medication Sig Start Date End Date Taking? Authorizing Provider  apixaban  (ELIQUIS ) 5 MG TABS tablet Take 1 tablet (5 mg total) by mouth 2 (two) times daily. 09/23/23  Yes [name], Rheta Celestine, MD  calcium  carbonate (OS-CAL - DOSED IN MG OF ELEMENTAL CALCIUM ) 1250 (500  Ca) MG tablet Take 1 tablet (500 mg of elemental calcium  total) by mouth daily with breakfast. 12/15/21  Yes Arleene Belt, PA-C  carvedilol  (COREG ) 6.25 MG tablet Take 1 tablet (6.25 mg total) by mouth 2 (two) times daily with a meal. 08/17/23  Yes [name], Rheta Celestine, MD  Cholecalciferol  (VITAMIN D -3) 125 MCG (5000 UT) TABS Take 1 tablet by mouth daily. 12/15/21  Yes Arleene Belt, PA-C  ezetimibe  (ZETIA ) 10 MG tablet Take 1 tablet (10 mg total) by mouth daily. 11/10/23  Yes [name], Rheta Celestine, MD  FARXIGA  10 MG TABS tablet Take 1 tablet (10 mg total) by mouth daily. 08/17/23  Yes [name], Rheta Celestine, MD  ferrous sulfate  325 (65 FE) MG tablet Take 325 mg by mouth daily with breakfast.   Yes [provider]  folic acid  (FOLVITE ) 1 MG tablet Take 1 tablet (1 mg total) by mouth daily. 12/08/23  Yes Milford, Arlice Bene, FNP  gabapentin  (NEURONTIN ) 300 MG capsule TAKE 3 CAPSULES(900 MG) BY MOUTH DAILY 10/19/23  Yes Valli Gaw, MD  methotrexate  (RHEUMATREX) 2.5 MG tablet TAKE 20MG (8 TABLETS) EVERY WEDNESDAY. PROTECT FROM LIGHT AS DIRECTED 10/03/23  Yes [name], Daniel R, MD  Multiple Vitamin (MULTIVITAMIN) tablet Take 1 tablet by mouth daily.   Yes [provider]  mupirocin  ointment (BACTROBAN ) 2 % Apply 1 Application topically daily. 08/29/23  Yes Valli Gaw, MD  pantoprazole  (PROTONIX ) 40 MG tablet Take 1 tablet (40 mg total) by mouth daily. 30 min before food 11/01/23  Yes [name], Rheta Celestine, MD  sacubitril -valsartan  (ENTRESTO ) 24-26 MG Take 1 tablet by mouth 2 (two) times daily. 08/17/23  Yes [name], Rheta Celestine, MD  spironolactone  (ALDACTONE ) 25 MG tablet Take 1 tablet (25 mg total) by mouth daily. 10/27/23  Yes [name], Rheta Celestine, MD    Physical Exam: Vitals:   12/18/23 1120 12/18/23 1245 12/18/23 1400 12/18/23 1404  BP: 124/73 122/85 127/73   Pulse: 79 71 70 70  Resp: 20 19 (!) 27 (!) 21  Temp: (!) 100.8 F (38.2 C)     SpO2: 91% 99% 100% 99%  Weight:       Height:       General: Alert, oriented x3, resting comfortably in no acute distress Respiratory: Bibasilar rales; no wheezing Cardiovascular: Regular rate and rhythm w/o m/r/g Abdomen: Soft, nondistended, but TTP diffusely. Positive bowel sounds MSK: CVA tenderness b/l   Data Reviewed:  Lab Results  Component Value Date   WBC 10.5 12/18/2023   HGB  12.2 (L) 12/18/2023   HCT 37.5 (L) 12/18/2023   MCV 99.2 12/18/2023   PLT 281 12/18/2023   Lab Results  Component Value Date   GLUCOSE 118 (H) 12/18/2023   CALCIUM  9.2 12/18/2023   NA 133 (L) 12/18/2023   K 4.1 12/18/2023   CO2 23 12/18/2023   CL 98 12/18/2023   BUN 17 12/18/2023   CREATININE 1.35 (H) 12/18/2023   Lab Results  Component Value Date   ALT 67 (H) 12/18/2023   AST 83 (H) 12/18/2023   ALKPHOS 60 12/18/2023   BILITOT 1.1 12/18/2023   No results found for: INR, PROTIME  Radiology: CT Head Wo Contrast Result Date: 12/18/2023 CLINICAL DATA:  Mental status change, unknown cause EXAM: CT HEAD WITHOUT CONTRAST TECHNIQUE: Contiguous axial images were obtained from the base of the skull through the vertex without intravenous contrast. RADIATION DOSE REDUCTION: This exam was performed according to the departmental dose-optimization program which includes automated exposure control, adjustment of the mA and/or kV according to patient size and/or use of iterative reconstruction technique. COMPARISON:  None Available. FINDINGS: Brain: There is periventricular white matter decreased attenuation consistent with small vessel ischemic changes. Ventricles, sulci and cisterns are prominent consistent with age related involutional changes. No acute intracranial hemorrhage, mass effect or shift. No hydrocephalus. Vascular: No hyperdense vessel or unexpected calcification. Skull: Normal. Negative for fracture or focal lesion. Sinuses/Orbits: No acute finding. IMPRESSION: Atrophy and chronic small vessel ischemic changes. No acute  intracranial process identified. Electronically Signed   By: Sydell Eva M.D.   On: 12/18/2023 12:50   DG Chest Portable 1 View Result Date: 12/18/2023 CLINICAL DATA:  fever, SOB EXAM: PORTABLE CHEST 1 VIEW COMPARISON:  December 11, 2021 FINDINGS: The cardiomediastinal silhouette is unchanged in contour.LEFT chest AICD. No pleural effusion. No pneumothorax. No acute pleuroparenchymal abnormality. IMPRESSION: No acute cardiopulmonary abnormality. Electronically Signed   By: Clancy Crimes M.D.   On: 12/18/2023 12:19    Assessment and Plan: 65M h/o Aflutter, HTN, and DDD p/w acute encephalopathy.  Acute encephalopathy Presumed UTI -MIVF: NS at 75cc/h for 24h -Continue IV CTX for now -HOLD pta robaxin and tramadol  -F/u urine culture  Aflutter Elevated BNP  Possible ADHF -IV lasix 40mg  BID for now; goal net neg 1-2L/d; strict I/Os; daily standing weights; K>4/Mg>2 -PTA coreg  6.25mg  BID and apixaban  5mg  BID -HOLD pta Entresto  for now; resume once safe   Advance Care Planning:   Code Status: Full Code   Consults: N/A  Family Communication: Wife, Gwendolyn  Severity of Illness: The appropriate patient status for this patient is INPATIENT. Inpatient status is judged to be reasonable and necessary in order to provide the required intensity of service to ensure the patient's safety. The patient's presenting symptoms, physical exam findings, and initial radiographic and laboratory data in the context of their chronic comorbidities is felt to place them at high risk for further clinical deterioration. Furthermore, it is not anticipated that the patient will be medically stable for discharge from the hospital within 2 midnights of admission.   * I certify that at the point of admission it is my clinical judgment that the patient will require inpatient hospital care spanning beyond 2 midnights from the point of admission due to high intensity of service, high risk for further deterioration and  high frequency of surveillance required.*   ------- I spent 55 minutes reviewing previous labs/notes, obtaining separate history at the bedside, counseling/discussing the treatment plan outlined above, ordering medications/tests, and performing clinical documentation.  Author: Arne Langdon, MD 12/18/2023 3:50 PM  For on call review [email or web address].

## 2023-12-18 NOTE — ED Provider Notes (Signed)
 [place] EMERGENCY DEPARTMENT AT [facility] Provider Note   CSN: [id number] Arrival date & time: 12/18/23  1109     History {Add pertinent medical, surgical, social history, OB history to HPI:1} Chief Complaint  Patient presents with   Weakness    John Keith is a 73 y.o. male with PMH as listed below who presents with generalized weakness and forgetting things for past 4 days. LKN was Tuesday. Went to urgent care on Wednesday because he was not feeling well with nonproductive cough/fever, shortness of breath, and nausea, and thought he might have the flu. Also had complained of back pain. Received prescriptions for methocarbamol and tramadol  which were both new medications for him. And wife states that he has been confused and forgetting things since that time. No h/o similar. He denies any N/T, falls, head trauma, visual changes, asymmetric weakness. No h/o stroke. Patient denies pain anywhere. He   Has ICD for recurrent VT on 12/10/21. Echo on 06/30/23 LVEF 55-60%, mild LVH.    Past Medical History:  Diagnosis Date   Allergy    Arthritis    Atrial flutter (HCC)    Carpal tunnel syndrome on left    Chest pain of uncertain etiology 10/24/2019   COVID-19    07/07/19   DDD (degenerative disc disease), thoracolumbar    multilevel   Degenerative joint disease of left shoulder    02/2011    Diverticulosis    left colon (2008)    ED (erectile dysfunction)    02/2011    GERD (gastroesophageal reflux disease)    H. pylori infection    Hyperlipidemia    Hypertension    Insomnia    Lactose intolerance    Low back pain    PVC (premature ventricular contraction) 10/24/2019   Shingles    07/2018   Smoking    smoking since age 10 y.o   Toe fracture, left    4th in 2014   Vitamin D  deficiency        Home Medications Prior to Admission medications   Medication Sig Start Date End Date Taking? Authorizing Provider  apixaban  (ELIQUIS ) 5 MG TABS tablet Take 1 tablet  (5 mg total) by mouth 2 (two) times daily. 09/23/23   [name], [name], MD  calcium  carbonate (OS-CAL - DOSED IN MG OF ELEMENTAL CALCIUM ) 1250 (500 Ca) MG tablet Take 1 tablet (500 mg of elemental calcium  total) by mouth daily with breakfast. 12/15/21   Colletta Manuelita Garre, PA-C  carvedilol  (COREG ) 6.25 MG tablet Take 1 tablet (6.25 mg total) by mouth 2 (two) times daily with a meal. 08/17/23   [name], [name], MD  Cholecalciferol  (VITAMIN D -3) 125 MCG (5000 UT) TABS Take 1 tablet by mouth daily. 12/15/21   Colletta Manuelita Garre, PA-C  ezetimibe  (ZETIA ) 10 MG tablet Take 1 tablet (10 mg total) by mouth daily. 11/10/23   [name], [name], MD  FARXIGA  10 MG TABS tablet Take 1 tablet (10 mg total) by mouth daily. 08/17/23   [name], [name], MD  ferrous sulfate  325 (65 FE) MG tablet Take 325 mg by mouth daily with breakfast.    [provider]  folic acid  (FOLVITE ) 1 MG tablet Take 1 tablet (1 mg total) by mouth daily. 12/08/23   Glena Harlene HERO, FNP  gabapentin  (NEURONTIN ) 300 MG capsule TAKE 3 CAPSULES(900 MG) BY MOUTH DAILY 10/19/23   [name], MD  methotrexate  (RHEUMATREX) 2.5 MG tablet TAKE 20MG (8 TABLETS) EVERY WEDNESDAY. PROTECT FROM  LIGHT AS DIRECTED 10/03/23   [name], Daniel R, MD  Multiple Vitamin (MULTIVITAMIN) tablet Take 1 tablet by mouth daily.    [provider]  mupirocin  ointment (BACTROBAN ) 2 % Apply 1 Application topically daily. 08/29/23   [name], MD  pantoprazole  (PROTONIX ) 40 MG tablet Take 1 tablet (40 mg total) by mouth daily. 30 min before food 11/01/23   [name], [name], MD  sacubitril -valsartan  (ENTRESTO ) 24-26 MG Take 1 tablet by mouth 2 (two) times daily. 08/17/23   [name], [name], MD  spironolactone  (ALDACTONE ) 25 MG tablet Take 1 tablet (25 mg total) by mouth daily. 10/27/23   [name], [name], MD      Allergies    Crestor  [rosuvastatin  calcium ] and Lipitor [atorvastatin  calcium ]    Review of Systems   Review of Systems A  10 point review of systems was performed and is negative unless otherwise reported in HPI.  Physical Exam Updated Vital Signs BP 124/73 (BP Location: Right Arm)   Pulse 79   Temp (!) 100.8 F (38.2 C)   Resp 20   Ht 6' 9 (2.057 m)   Wt 105.2 kg   SpO2 91%   BMI 24.86 kg/m  Physical Exam General: Normal appearing male, lying in bed.  HEENT: NCAT, PERRLA, EOMI, no nystagmus, Sclera anicteric, MMM, trachea midline. Tongue protrudes midline.  Cardiology: RRR, no murmurs/rubs/gallops.  Resp: Normal respiratory rate and effort. CTAB, no wheezes, rhonchi, crackles.  Abd: Soft, non-tender, non-distended. No rebound tenderness or guarding.  GU: Deferred. MSK: No peripheral edema or signs of trauma. Extremities without deformity or TTP. No cyanosis or clubbing. Skin: warm, dry.  Back: No CVA tenderness Neuro: A&Ox3-4 (intermittently confused about situation; correctly knew the year but not the month), CNs II-XII grossly intact. 5/5 strength all extremities. Sensation grossly intact. Normal speech. Names 2 of 5 objects correctly. When asked to name thumb, he states nail polish, which could be acceptable. When subsequently asked to name the objects pen and then paper, patient again states nail polish for both objects despite several prompts.  Psych: Normal mood and affect.   ED Results / Procedures / Treatments   Labs (all labs ordered are listed, but only abnormal results are displayed) Labs Reviewed  COMPREHENSIVE METABOLIC PANEL WITH GFR - Abnormal; Notable for the following components:   Sodium 133 (*)    Glucose, Bld 118 (*)    Creatinine, Ser 1.35 (*)    Albumin 3.4 (*)    AST 83 (*)    ALT 67 (*)    GFR, Estimated 55 (*)    All other components within normal limits  CBC - Abnormal; Notable for the following components:   RBC 3.78 (*)    Hemoglobin 12.2 (*)    HCT 37.5 (*)    All other components within normal limits  BRAIN NATRIURETIC PEPTIDE - Abnormal; Notable for the  following components:   B Natriuretic Peptide 242.4 (*)    All other components within normal limits  URINALYSIS, ROUTINE W REFLEX MICROSCOPIC - Abnormal; Notable for the following components:   APPearance HAZY (*)    Specific Gravity, Urine 1.032 (*)    Glucose, UA >=500 (*)    Hgb urine dipstick MODERATE (*)    Ketones, ur 5 (*)    Protein, ur 30 (*)    Leukocytes,Ua SMALL (*)    Bacteria, UA RARE (*)    All other components within normal limits  TSH - Abnormal; Notable for the following components:  TSH 0.274 (*)    All other components within normal limits  VITAMIN B12 - Abnormal; Notable for the following components:   Vitamin B-12 1,251 (*)    All other components within normal limits  T4, FREE - Abnormal; Notable for the following components:   Free T4 1.31 (*)    All other components within normal limits  TROPONIN I (HIGH SENSITIVITY) - Abnormal; Notable for the following components:   Troponin I (High Sensitivity) 32 (*)    All other components within normal limits  TROPONIN I (HIGH SENSITIVITY) - Abnormal; Notable for the following components:   Troponin I (High Sensitivity) 36 (*)    All other components within normal limits  RESP PANEL BY RT-PCR (RSV, FLU A&B, COVID)  RVPGX2  AMMONIA  FOLATE  MAGNESIUM  MAGNESIUM  PROCALCITONIN    EKG EKG Interpretation Date/Time:  Sunday December 18 2023 11:17:36 EDT Ventricular Rate:  83 PR Interval:  190 QRS Duration:  114 QT Interval:  360 QTC Calculation: 423 R Axis:   47  Text Interpretation: Sinus rhythm with frequent Premature ventricular complexes Incomplete right bundle branch block Nonspecific T wave abnormality No significant change since last tracing Confirmed by Franklyn Gills [phone]) on 12/18/2023 11:38:08 AM  Radiology   Procedures Procedures  {Document cardiac monitor, telemetry assessment procedure when appropriate:1}  Medications Ordered in ED Medications  cefTRIAXone  (ROCEPHIN ) 1 g in sodium chloride   0.9 % 100 mL IVPB (0 g Intravenous Stopped 12/18/23 1636)  furosemide  (LASIX ) injection 40 mg (40 mg Intravenous Given 12/19/23 1505)    ED Course/ Medical Decision Making/ A&P                          Medical Decision Making Amount and/or Complexity of Data Reviewed Labs: ordered. Decision-making details documented in ED Course. Radiology: ordered. Decision-making details documented in ED Course.  Risk Decision regarding hospitalization.    This patient presents to the ED for concern of ***, this involves an extensive number of treatment options, and is a complaint that carries with it a high risk of complications and morbidity.  I considered the following differential and admission for this acute, potentially life threatening condition.   MDM:    ***  Clinical Course as of 12/22/23 1458  Sun Dec 18, 2023  1137 Glucose-Capillary(!): 129 [HN]  1208 WBC: 10.5 No leukocytosis  [HN]  1245 Troponin I (High Sensitivity)(!): 32 Mildly elevated but consistent with priors [HN]  1245 ALT(!): 67 [HN]  1245 AST(!): 83 Mildly elevated AST/ALT [HN]  1246 DG Chest Portable 1 View No acute cardiopulmonary abnormality. [HN]  1257 CT Head Wo Contrast Atrophy and chronic small vessel ischemic changes. No acute intracranial process identified.   [HN]  1301 B Natriuretic Peptide(!): 242.4 Mildly elevated today [HN]  1426 Urinalysis, Routine w reflex microscopic -Urine, Clean Catch(!) +UTI, will give ceftriaxone  and admit. No prior culture data. With earlier report of back pain, consider pyelonephritis or prostatitis also on differential. [HN]  1427 ALT(!): 67 [HN]  1427 AST(!): 83 Mildly elevated LFTs, will get RUQ US  as well, though no RUQ TTP or murphy's sign, and there is another reason for fever. [HN]    Clinical Course User Index [HN] Franklyn Gills SAILOR, MD    Labs: I Ordered, and personally interpreted labs.  The pertinent results include:  ***  Imaging Studies ordered: I  ordered imaging studies including CTH, CXR I independently visualized and interpreted imaging. I agree with  the radiologist interpretation  Additional history obtained from chart review.  External records from outside source obtained and reviewed including ***  Cardiac Monitoring: The patient was maintained on a cardiac monitor.  I personally viewed and interpreted the cardiac monitored which showed an underlying rhythm of: ***  Reevaluation: After the interventions noted above, I reevaluated the patient and found that they have :improved  Social Determinants of Health: ***  Disposition:  ***  Co morbidities that complicate the patient evaluation  Past Medical History:  Diagnosis Date   Allergy    Arthritis    Atrial flutter (HCC)    Carpal tunnel syndrome on left    Chest pain of uncertain etiology 10/24/2019   COVID-19    07/07/19   DDD (degenerative disc disease), thoracolumbar    multilevel   Degenerative joint disease of left shoulder    02/2011    Diverticulosis    left colon (2008)    ED (erectile dysfunction)    02/2011    GERD (gastroesophageal reflux disease)    H. pylori infection    Hyperlipidemia    Hypertension    Insomnia    Lactose intolerance    Low back pain    PVC (premature ventricular contraction) 10/24/2019   Shingles    07/2018   Smoking    smoking since age 53 y.o   Toe fracture, left    4th in 2014   Vitamin D  deficiency      Medicines No orders of the defined types were placed in this encounter.   I have reviewed the patients home medicines and have made adjustments as needed  Problem List / ED Course: Problem List Items Addressed This Visit   None        {Document critical care time when appropriate:1} {Document review of labs and clinical decision tools ie heart score, Chads2Vasc2 etc:1}  {Document your independent review of radiology images, and any outside records:1} {Document your discussion with family members,  caretakers, and with consultants:1} {Document social determinants of health affecting pt's care:1} {Document your decision making why or why not admission, treatments were needed:1}  This note was created using dictation software, which may contain spelling or grammatical errors.

## 2023-12-18 NOTE — ED Triage Notes (Addendum)
 Pt has had weakness and forgetting items for past 4 days. Family is concerned about pacemaker. Pt denies pain. Pt has had nausea and shortness of breath. Pt does not wear oxygen at home.

## 2023-12-18 NOTE — Plan of Care (Signed)
  Problem: Education: Goal: Knowledge of General Education information will improve Description: Including pain rating scale, medication(s)/side effects and non-pharmacologic comfort measures 12/18/2023 1704 by Elvina Hammers, RN Outcome: Progressing 12/18/2023 1627 by Elvina Hammers, RN Outcome: Progressing   Problem: Health Behavior/Discharge Planning: Goal: Ability to manage health-related needs will improve 12/18/2023 1704 by Elvina Hammers, RN Outcome: Progressing 12/18/2023 1627 by Elvina Hammers, RN Outcome: Progressing   Problem: Clinical Measurements: Goal: Ability to maintain clinical measurements within normal limits will improve 12/18/2023 1704 by Elvina Hammers, RN Outcome: Progressing 12/18/2023 1627 by Elvina Hammers, RN Outcome: Progressing Goal: Will remain free from infection 12/18/2023 1704 by Elvina Hammers, RN Outcome: Progressing 12/18/2023 1627 by Elvina Hammers, RN Outcome: Progressing Goal: Diagnostic test results will improve 12/18/2023 1704 by Elvina Hammers, RN Outcome: Progressing 12/18/2023 1627 by Elvina Hammers, RN Outcome: Progressing Goal: Respiratory complications will improve 12/18/2023 1704 by Elvina Hammers, RN Outcome: Progressing 12/18/2023 1627 by Elvina Hammers, RN Outcome: Progressing Goal: Cardiovascular complication will be avoided 12/18/2023 1704 by Elvina Hammers, RN Outcome: Progressing 12/18/2023 1627 by Elvina Hammers, RN Outcome: Progressing   Problem: Activity: Goal: Risk for activity intolerance will decrease 12/18/2023 1704 by Elvina Hammers, RN Outcome: Progressing 12/18/2023 1627 by Elvina Hammers, RN Outcome: Progressing   Problem: Nutrition: Goal: Adequate nutrition will be maintained 12/18/2023 1704 by Elvina Hammers, RN Outcome: Progressing 12/18/2023 1627 by Elvina Hammers, RN Outcome: Progressing   Problem: Coping: Goal: Level of anxiety will decrease 12/18/2023 1704 by Elvina Hammers, RN Outcome:  Progressing 12/18/2023 1627 by Elvina Hammers, RN Outcome: Progressing   Problem: Elimination: Goal: Will not experience complications related to bowel motility 12/18/2023 1704 by Elvina Hammers, RN Outcome: Progressing 12/18/2023 1627 by Elvina Hammers, RN Outcome: Progressing   Problem: Elimination: Goal: Will not experience complications related to bowel motility 12/18/2023 1704 by Elvina Hammers, RN Outcome: Progressing 12/18/2023 1627 by Elvina Hammers, RN Outcome: Progressing   Problem: Elimination: Goal: Will not experience complications related to bowel motility 12/18/2023 1704 by Elvina Hammers, RN Outcome: Progressing 12/18/2023 1627 by Elvina Hammers, RN Outcome: Progressing Goal: Will not experience complications related to urinary retention 12/18/2023 1704 by Elvina Hammers, RN Outcome: Progressing 12/18/2023 1627 by Elvina Hammers, RN Outcome: Progressing   Problem: Pain Managment: Goal: General experience of comfort will improve and/or be controlled 12/18/2023 1704 by Elvina Hammers, RN Outcome: Progressing 12/18/2023 1627 by Elvina Hammers, RN Outcome: Progressing   Problem: Safety: Goal: Ability to remain free from injury will improve 12/18/2023 1704 by Elvina Hammers, RN Outcome: Progressing 12/18/2023 1627 by Elvina Hammers, RN Outcome: Progressing   Problem: Skin Integrity: Goal: Risk for impaired skin integrity will decrease 12/18/2023 1704 by Elvina Hammers, RN Outcome: Progressing 12/18/2023 1627 by Elvina Hammers, RN Outcome: Progressing

## 2023-12-18 NOTE — Plan of Care (Signed)

## 2023-12-18 NOTE — ED Notes (Signed)
 CCMD contacted for monitoring

## 2023-12-19 ENCOUNTER — Ambulatory Visit: Payer: Self-pay | Admitting: Cardiology

## 2023-12-19 ENCOUNTER — Inpatient Hospital Stay (HOSPITAL_COMMUNITY)

## 2023-12-19 DIAGNOSIS — G9340 Encephalopathy, unspecified: Secondary | ICD-10-CM | POA: Diagnosis not present

## 2023-12-19 LAB — BASIC METABOLIC PANEL WITH GFR
Anion gap: 12 (ref 5–15)
BUN: 23 mg/dL (ref 8–23)
CO2: 25 mmol/L (ref 22–32)
Calcium: 9 mg/dL (ref 8.9–10.3)
Chloride: 98 mmol/L (ref 98–111)
Creatinine, Ser: 1.5 mg/dL — ABNORMAL HIGH (ref 0.61–1.24)
GFR, Estimated: 49 mL/min — ABNORMAL LOW (ref >60)
Glucose, Bld: 122 mg/dL — ABNORMAL HIGH (ref 70–99)
Potassium: 3.7 mmol/L (ref 3.5–5.1)
Sodium: 135 mmol/L (ref 135–145)

## 2023-12-19 LAB — CBC
HCT: 37.7 % — ABNORMAL LOW (ref 39.0–52.0)
Hemoglobin: 12.2 g/dL — ABNORMAL LOW (ref 13.0–17.0)
MCH: 31.9 pg (ref 26.0–34.0)
MCHC: 32.4 g/dL (ref 30.0–36.0)
MCV: 98.7 fL (ref 80.0–100.0)
Platelets: 275 10*3/uL (ref 150–400)
RBC: 3.82 MIL/uL — ABNORMAL LOW (ref 4.22–5.81)
RDW: 13.6 % (ref 11.5–15.5)
WBC: 10.7 10*3/uL — ABNORMAL HIGH (ref 4.0–10.5)
nRBC: 0 % (ref 0.0–0.2)

## 2023-12-19 LAB — VITAMIN B12: Vitamin B-12: 1251 pg/mL — ABNORMAL HIGH (ref 180–914)

## 2023-12-19 LAB — FOLATE: Folate: 30.4 ng/mL (ref >5.9)

## 2023-12-19 LAB — TSH: TSH: 0.274 u[IU]/mL — ABNORMAL LOW (ref 0.350–4.500)

## 2023-12-19 LAB — AMMONIA: Ammonia: <13 umol/L (ref 9–35)

## 2023-12-19 LAB — T4, FREE: Free T4: 1.31 ng/dL — ABNORMAL HIGH (ref 0.61–1.12)

## 2023-12-19 MED ORDER — ACETAMINOPHEN 325 MG PO TABS
650.0000 mg | ORAL_TABLET | Freq: Once | ORAL | Status: AC
  Administered 2023-12-19: 650 mg via ORAL
  Filled 2023-12-19: qty 2

## 2023-12-19 MED ORDER — ACETAMINOPHEN 325 MG PO TABS
650.0000 mg | ORAL_TABLET | Freq: Four times a day (QID) | ORAL | Status: DC | PRN
  Administered 2023-12-19 – 2023-12-22 (×6): 650 mg via ORAL
  Filled 2023-12-19 (×6): qty 2

## 2023-12-19 MED ORDER — SODIUM CHLORIDE 0.9 % IV SOLN
2.0000 g | INTRAVENOUS | Status: DC
  Administered 2023-12-19 – 2023-12-21 (×3): 2 g via INTRAVENOUS
  Filled 2023-12-19 (×3): qty 20

## 2023-12-19 NOTE — Evaluation (Signed)
 Physical Therapy Evaluation Patient Details Name: John Keith MRN: [id number] DOB: 02/01/1951 Today's Date: 12/19/2023  History of Present Illness  John Keith is a 73 y.o. male who presents on 12/18/23 with back pain, decreased appetite, fatigue, acute encephalopathy. Presumed UTI. PMH:  Aflutter, pacer, HTN, and DDD  Clinical Impression  Pt admitted with above diagnosis. Pt from home with wife and independent per his report, however, pt only oriented to self on eval so question reliability of responses. Pt mobilizes with only min A but fatigues very quickly in standing to the point that he could not ambulate a functional distance on eval. Between this physical limitation and his cognitive status patient will benefit from continued inpatient follow up therapy, <3 hours/day  Pt currently with functional limitations due to the deficits listed below (see PT Problem List). Pt will benefit from acute skilled PT to increase their independence and safety with mobility to allow discharge.           If plan is discharge home, recommend the following: A lot of help with walking and/or transfers;A little help with bathing/dressing/bathroom;Assistance with cooking/housework;Assistance with feeding;Assist for transportation;Help with stairs or ramp for entrance   Can travel by private vehicle   Yes    Equipment Recommendations Other (comment) (TBD)  Recommendations for Other Services       Functional Status Assessment Patient has had a recent decline in their functional status and demonstrates the ability to make significant improvements in function in a reasonable and predictable amount of time.     Precautions / Restrictions Precautions Precautions: Fall Restrictions Weight Bearing Restrictions Per Provider Order: No      Mobility  Bed Mobility               General bed mobility comments: pt received in recliner    Transfers Overall transfer level: Needs  assistance Equipment used: Rolling walker (2 wheels) Transfers: Sit to/from Stand Sit to Stand: Min assist           General transfer comment: min A to steady from recliner, vc's for hand placement. Pt able to take steps to bed and back with min A    Ambulation/Gait Ambulation/Gait assistance: Min assist Gait Distance (Feet): 5 Feet Assistive device: Rolling walker (2 wheels) Gait Pattern/deviations: Step-through pattern, Shuffle Gait velocity: decreased Gait velocity interpretation: <1.31 ft/sec, indicative of household ambulator   General Gait Details: stepped only a few feet before needing to sit due to fatigue  Stairs            Wheelchair Mobility     Tilt Bed    Modified Rankin (Stroke Patients Only)       Balance Overall balance assessment: Needs assistance Sitting-balance support: Feet supported, No upper extremity supported Sitting balance-Leahy Scale: Fair Sitting balance - Comments: able to sit and balance edge of chair   Standing balance support: Bilateral upper extremity supported, During functional activity, Reliant on assistive device for balance Standing balance-Leahy Scale: Poor Standing balance comment: reliant on support                             Pertinent Vitals/Pain Pain Assessment Pain Assessment: No/denies pain    Home Living Family/patient expects to be discharged to:: Private residence Living Arrangements: Spouse/significant other Available Help at Discharge: Family;Available 24 hours/day Type of Home: House Home Access: Level entry       Home Layout: One level Home Equipment: None Additional  Comments: lives with wife who was not present on eval, question validity of responses as pt is oriented only to self    Prior Function Prior Level of Function : Independent/Modified Independent;Driving (per pt)             Mobility Comments: retired from Training and development officer for [employer]       Extremity/Trunk  Assessment   Upper Extremity Assessment Upper Extremity Assessment: Defer to OT evaluation    Lower Extremity Assessment Lower Extremity Assessment: Generalized weakness    Cervical / Trunk Assessment Cervical / Trunk Assessment: Other exceptions Cervical / Trunk Exceptions: h/o DDD, very tall  Communication   Communication Communication: No apparent difficulties    Cognition Arousal: Alert Behavior During Therapy: Flat affect   PT - Cognitive impairments: No family/caregiver present to determine baseline                       PT - Cognition Comments: pt oriented only to self. Minimal verbalization. Follows one step commands with increased time Following commands: Impaired Following commands impaired: Follows one step commands with increased time     Cueing Cueing Techniques: Verbal cues, Tactile cues     General Comments General comments (skin integrity, edema, etc.): VSS. Pt lunch present but pt would not take a bite, reports that he has no appetite and food makes him nauseous    Exercises     Assessment/Plan    PT Assessment Patient needs continued PT services  PT Problem List Decreased strength;Decreased activity tolerance;Decreased balance;Decreased mobility;Decreased cognition;Decreased knowledge of use of DME       PT Treatment Interventions DME instruction;Gait training;Functional mobility training;Therapeutic activities;Stair training;Therapeutic exercise;Balance training;Neuromuscular re-education;Cognitive remediation;Patient/family education    PT Goals (Current goals can be found in the Care Plan section)  Acute Rehab PT Goals Patient Stated Goal: none stated PT Goal Formulation: Patient unable to participate in goal setting Time For Goal Achievement: 01/02/24 Potential to Achieve Goals: Fair    Frequency Min 2X/week     Co-evaluation               AM-PAC PT 6 Clicks Mobility  Outcome Measure Help needed turning from your back  to your side while in a flat bed without using bedrails?: A Little Help needed moving from lying on your back to sitting on the side of a flat bed without using bedrails?: A Little Help needed moving to and from a bed to a chair (including a wheelchair)?: A Little Help needed standing up from a chair using your arms (e.g., wheelchair or bedside chair)?: A Little Help needed to walk in hospital room?: Total Help needed climbing 3-5 steps with a railing? : Total 6 Click Score: 14    End of Session Equipment Utilized During Treatment: Gait belt Activity Tolerance: Patient limited by fatigue Patient left: in chair;with call bell/phone within reach Nurse Communication: Mobility status PT Visit Diagnosis: Unsteadiness on feet (R26.81);Muscle weakness (generalized) (M62.81);Difficulty in walking, not elsewhere classified (R26.2)    Time: 6962-9528 PT Time Calculation (min) (ACUTE ONLY): 21 min   Charges:   PT Evaluation $PT Eval Moderate Complexity: 1 Mod   PT General Charges $$ ACUTE PT VISIT: 1 Visit         Amey Ka, PT  Acute Rehab Services Secure chat preferred Office [phone]   Deloris Fetters Mallori Araque 12/19/2023, 2:51 PM

## 2023-12-19 NOTE — [place] FL2 (Signed)
 [place]  MEDICAID FL2 LEVEL OF CARE FORM     IDENTIFICATION  Patient Name: John Keith Birthdate: 1950/12/08 Sex: male Admission Date (Current Location): 12/18/2023  [facility] and [place] Number:  Producer, television/film/video and Address:  [facility] [place], [address], [place], [place] [address]      Provider Number: [id number]  Attending Physician Name and Address:  Maggie Schooner, MD  Relative Name and Phone Number:  Ber[place], Balz [phone]  [phone]    Current Level of Care: Hospital Recommended Level of Care: Skilled Nursing Facility Prior Approval Number:    Date Approved/Denied:   PASRR Number: [id number]  Discharge Plan: SNF    Current Diagnoses: Patient Active Problem List   Diagnosis Date Noted   Acute encephalopathy 12/18/2023   On anticoagulant therapy 08/29/2023   On amiodarone  therapy 08/29/2023   Dermatitis 01/31/2023   Onychomycosis 01/31/2023   Statin myopathy 11/15/2022   Abnormal glucose 11/15/2022   Prostate cancer screening 11/15/2022   Chronic systolic heart failure (HCC) 12/15/2021   Chronic combined systolic and diastolic CHF (congestive heart failure) (HCC) 12/15/2021   Nasal sore 12/15/2021   Syncope 12/07/2021   Ventricular tachycardia (HCC) 12/07/2021   Typical atrial flutter (HCC) 09/24/2021   Secondary hypercoagulable state (HCC) 09/24/2021   Abnormal x-ray of neck 06/16/2021   Abnormal MRI, lumbar spine 10/23/2020   Chronic low back pain 10/23/2020   Neuropathy 10/23/2020   Coronary artery disease of native artery of native heart with stable angina pectoris (HCC) 10/23/2020   Impingement syndrome of left shoulder region 08/22/2020   Pain in joint of left shoulder 07/24/2020   Chronic left shoulder pain 07/11/2020   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   Anemia 12/13/2019   Chest pain of uncertain etiology 10/24/2019   PVC (premature ventricular contraction) 10/24/2019   Gastric ulcer without  hemorrhage or perforation 09/28/2019   COVID-19 virus detected 07/12/2019   Elevated troponin 07/07/2019   Elevated serum protein level 07/07/2019   Postherpetic neuralgia 08/17/2018   Benign prostatic hyperplasia 08/01/2018   Hip pain 06/29/2018   Aortic atherosclerosis (HCC) 09/22/2016   Fatigue 06/03/2016   Lower urinary tract symptoms (LUTS) 06/03/2016   Constipation 03/10/2016   Lumbar radiculopathy 02/20/2016   Encounter for immunization 11/26/2015   Insomnia 06/10/2015   External hemorrhoid 03/07/2015   Health care maintenance 01/09/2014   Carpal tunnel syndrome of left wrist 01/03/2013   Hyperlipidemia 11/09/2012   Vitamin D  deficiency 11/09/2012   Essential hypertension 11/06/2012   Tobacco abuse 11/06/2012   Seasonal allergies 11/06/2012   Erectile dysfunction 11/06/2012   GERD (gastroesophageal reflux disease) 11/06/2012   DDD (degenerative disc disease), thoracolumbar 11/06/2012   Lactose intolerance 11/06/2012   Diverticulosis of colon without hemorrhage 11/06/2012   Diverticulosis of colon 11/06/2012   Degeneration of thoracolumbar intervertebral disc 11/06/2012   Gastroesophageal reflux disease 11/06/2012   Tobacco user 11/06/2012    Orientation RESPIRATION BLADDER Height & Weight     Self, Place  O2 External catheter, Incontinent Weight: 230 lb 9.6 oz (104.6 kg) Height:  6' 9 (205.7 cm)  BEHAVIORAL SYMPTOMS/MOOD NEUROLOGICAL BOWEL NUTRITION STATUS      Continent Diet (see discharge summary)  AMBULATORY STATUS COMMUNICATION OF NEEDS Skin   Limited Assist Verbally Normal                       Personal Care Assistance Level of Assistance  Bathing, Feeding, Dressing Bathing Assistance: Limited assistance Feeding assistance: Limited  assistance Dressing Assistance: Limited assistance     Functional Limitations Info  Sight, Hearing, Speech Sight Info: Adequate Hearing Info: Adequate Speech Info: Adequate    SPECIAL CARE FACTORS FREQUENCY  PT  (By licensed PT), OT (By licensed OT)     PT Frequency: 5x week OT Frequency: 5x week            Contractures Contractures Info: Not present    Additional Factors Info  Code Status, Allergies Code Status Info: full Allergies Info: Crestor  (Rosuvastatin  Calcium ), Lipitor (Atorvastatin  Calcium )           Current Medications (12/19/2023):  This is the current hospital active medication list Current Facility-Administered Medications  Medication Dose Route Frequency Provider Last Rate Last Admin   acetaminophen  (TYLENOL ) tablet 650 mg  650 mg Oral Q6H PRN Amin, Ankit C, MD   650 mg at 12/19/23 1505   apixaban  (ELIQUIS ) tablet 5 mg  5 mg Oral BID Arne Langdon, MD   5 mg at 12/19/23 1324   carvedilol  (COREG ) tablet 6.25 mg  6.25 mg Oral BID WC Arne Langdon, MD   6.25 mg at 12/19/23 4010   cefTRIAXone  (ROCEPHIN ) 2 g in sodium chloride  0.9 % 100 mL IVPB  2 g Intravenous Q24H Pham, Minh Q, RPH-CPP 200 mL/hr at 12/19/23 1219 2 g at 12/19/23 1219   lactated ringers infusion   Intravenous Continuous Arne Langdon, MD 75 mL/hr at 12/19/23 1218 New Bag at 12/19/23 1218   pantoprazole  (PROTONIX ) EC tablet 40 mg  40 mg Oral Daily Moore, Willie, MD   40 mg at 12/19/23 2725     Discharge Medications: Please see discharge summary for a list of discharge medications.  Relevant Imaging Results:  Relevant Lab Results:   Additional Information SSN: [id number]  Elspeth Hals, LCSW

## 2023-12-19 NOTE — Discharge Instructions (Signed)

## 2023-12-19 NOTE — Progress Notes (Signed)
  Device system confirmed to be MRI conditional, with implant date > 6 weeks ago, and no evidence of abandoned or epicardial leads in review of most recent CXR  Device last cleared by EP Provider Valeri Gate PA  Clearance is good through for 1 year as long as parameters remain stable at time of check. If pt undergoes a cardiac device procedure during that time, they should be re-cleared.   Tachy-therapies to be programmed off if applicable with device back to pre-MRI settings after completion of exam.  [employer] - Programming recommendation received through [employer] App/Tablet  John Keith  12/19/2023 9:50 AM

## 2023-12-19 NOTE — Hospital Course (Addendum)
 Brief Narrative:   73 y.o. male with medical history significant of Aflutter, HTN, and DDD p/w acute encephalopathy.  Earlier in the week started having back pain therefore went to urgent care where he was prescribed tramadol  and Robaxin which she has been taking daily.  During this time became increasingly confused, upon admission CT head negative.  Diagnosed with UTI, started on Rocephin .  Mentation slowly started improving.  Today patient mental status is back to baseline.  Did have low-grade temperature but CT abdomen pelvis is negative for any acute pathology or deeper acute infection. Will add azithromycin  and change his Rocephin  to PO duricef.  Follow-up outpatient with primary care provider.  Assessment & Plan:  Principal Problem:   Acute encephalopathy   Acute toxic encephalopathy, rule solved - And is back to baseline.  Suspect from UTI and medication related.  This is significantly improved.  Most of the metabolic workup is negative.  Low TSH, free T4 borderline elevated.  Recheck in 3-4 weeks outpatient  Urinary tract infection - Empiric IV Rocephin .  No cultures available.  Due to persistent fever CT abdomen pelvis was performed which did not show any acute pathology.  Did have some atypical appearing opacity in the lungs therefore added azithromycin .  Lungs are overall clear to auscultation bilaterally  Mild transaminitis - Right upper quadrant ultrasound is negative.  Follow-up outpatient  Atrial fibrillation/flutter - Continue Coreg , Eliquis   Essential hypertension Resume home meds  DVT prophylaxis: apixaban  (ELIQUIS ) tablet 5 mg      Code Status: Full Code Family Communication: Spouse at bedside Discharge   Subjective: Feeling great mentation is back to baseline.  Alert awake oriented.  Had low-grade temperature but wishing to go home today.  He will have outpatient follow-up  Examination:  General exam: Appears calm and comfortable  Respiratory system: Clear to  auscultation. Respiratory effort normal. Cardiovascular system: S1 & S2 heard, RRR. No JVD, murmurs, rubs, gallops or clicks. No pedal edema. Gastrointestinal system: Abdomen is nondistended, soft and nontender. No organomegaly or masses felt. Normal bowel sounds heard. Central nervous system: Alert and oriented to name and place Extremities: Symmetric 5 x 5 power. Skin: No rashes, lesions or ulcers Psychiatry: Judgement and insight appear normal. Mood & affect appropriate.

## 2023-12-19 NOTE — Plan of Care (Signed)

## 2023-12-19 NOTE — Progress Notes (Signed)
 PROGRESS NOTE    John Keith  ZOX:[id number] DOB: June 09, 1951 DOA: 12/18/2023 PCP: Valli Gaw, MD    Brief Narrative:   73 y.o. male with medical history significant of Aflutter, HTN, and DDD p/w acute encephalopathy.  Earlier in the week started having back pain therefore went to urgent care where he was prescribed tramadol  and Robaxin which she has been taking daily.  During this time became increasingly confused, upon admission CT head negative.  Assessment & Plan:  Principal Problem:   Acute encephalopathy   Acute toxic encephalopathy - I suspect medication related, allow medication to washout.  CT of the head is negative.  Supportive care.  IV fluids.  Folate, B12 and ammonia are normal.  Low TSH, check free T4  Urinary tract infection - Empiric IV Rocephin   Mild transaminitis - Right upper quadrant ultrasound is negative.  Follow-up outpatient  Atrial fibrillation/flutter - Continue Coreg , Eliquis   Essential hypertension -Cortrak, IV as needed  DVT prophylaxis: apixaban  (ELIQUIS ) tablet 5 mg      Code Status: Full Code Family Communication:   Ongoing treatment for UTI   Subjective:  Seen at bedside.  No complaints but he is pleasantly confused.  Alert to name and place  Examination:  General exam: Appears calm and comfortable  Respiratory system: Clear to auscultation. Respiratory effort normal. Cardiovascular system: S1 & S2 heard, RRR. No JVD, murmurs, rubs, gallops or clicks. No pedal edema. Gastrointestinal system: Abdomen is nondistended, soft and nontender. No organomegaly or masses felt. Normal bowel sounds heard. Central nervous system: Alert and oriented to name and place Extremities: Symmetric 5 x 5 power. Skin: No rashes, lesions or ulcers Psychiatry: Judgement and insight appear normal. Mood & affect appropriate.                Diet Orders (From admission, onward)     Start     Ordered   12/18/23 1534  Diet regular Room service  appropriate? Yes; Fluid consistency: Thin  Diet effective now       Question Answer Comment  Room service appropriate? Yes   Fluid consistency: Thin      12/18/23 1533            Objective: Vitals:   12/18/23 2045 12/19/23 0207 12/19/23 0537 12/19/23 0705  BP: 134/64 (!) 143/84 125/71 (!) 148/86  Pulse: 75 81 75 82  Resp: 19 (!) 22 19 18   Temp: 99.1 F (37.3 C) (!) 100.9 F (38.3 C) 99.3 F (37.4 C) (!) 100.8 F (38.2 C)  TempSrc:    Oral  SpO2: 95% 98% 99% 99%  Weight:      Height:        Intake/Output Summary (Last 24 hours) at 12/19/2023 1339 Last data filed at 12/19/2023 0900 Gross per 24 hour  Intake 1198.98 ml  Output 1450 ml  Net -251.02 ml   Filed Weights   12/18/23 1119 12/18/23 1622  Weight: 105.2 kg 104.6 kg    Scheduled Meds:  apixaban   5 mg Oral BID   carvedilol   6.25 mg Oral BID WC   furosemide  40 mg Intravenous Q12H   pantoprazole   40 mg Oral Daily   Continuous Infusions:  cefTRIAXone  (ROCEPHIN )  IV 2 g (12/19/23 1219)   lactated ringers 75 mL/hr at 12/19/23 1218    Nutritional status     Body mass index is 24.71 kg/m.  Data Reviewed:   CBC: Recent Labs  Lab 12/18/23 1128 12/18/23 1555 12/19/23 0523  WBC 10.5  10.6* 10.7*  HGB 12.2* 12.2* 12.2*  HCT 37.5* 37.2* 37.7*  MCV 99.2 98.9 98.7  PLT 281 295 275   Basic Metabolic Panel: Recent Labs  Lab 12/18/23 1128 12/18/23 1555 12/19/23 0523  NA 133*  --  135  K 4.1  --  3.7  CL 98  --  98  CO2 23  --  25  GLUCOSE 118*  --  122*  BUN 17  --  23  CREATININE 1.35* 1.27* 1.50*  CALCIUM  9.2  --  9.0   GFR: Estimated Creatinine Clearance: 60.1 mL/min (A) (by C-G formula based on SCr of 1.5 mg/dL (H)). Liver Function Tests: Recent Labs  Lab 12/18/23 1128  AST 83*  ALT 67*  ALKPHOS 60  BILITOT 1.1  PROT 7.8  ALBUMIN 3.4*   No results for input(s): LIPASE, AMYLASE in the last 168 hours. Recent Labs  Lab 12/19/23 1121  AMMONIA <13   Coagulation Profile: No  results for input(s): INR, PROTIME in the last 168 hours. Cardiac Enzymes: No results for input(s): CKTOTAL, CKMB, CKMBINDEX, TROPONINI in the last 168 hours. BNP (last 3 results) No results for input(s): PROBNP in the last 8760 hours. HbA1C: No results for input(s): HGBA1C in the last 72 hours. CBG: Recent Labs  Lab 12/18/23 1121  GLUCAP 129*   Lipid Profile: No results for input(s): CHOL, HDL, LDLCALC, TRIG, CHOLHDL, LDLDIRECT in the last 72 hours. Thyroid  Function Tests: Recent Labs    12/19/23 0523  TSH 0.274*   Anemia Panel: Recent Labs    12/19/23 0523  VITAMINB12 1,251*  FOLATE 30.4   Sepsis Labs: No results for input(s): PROCALCITON, LATICACIDVEN in the last 168 hours.  Recent Results (from the past 240 hours)  Resp panel by RT-PCR (RSV, Flu A&B, Covid) Anterior Nasal Swab     Status: None   Collection Time: 12/18/23 11:37 AM   Specimen: Anterior Nasal Swab  Result Value Ref Range Status   SARS Coronavirus 2 by RT PCR NEGATIVE NEGATIVE Final   Influenza A by PCR NEGATIVE NEGATIVE Final   Influenza B by PCR NEGATIVE NEGATIVE Final    Comment: (NOTE) The Xpert Xpress SARS-CoV-2/FLU/RSV plus assay is intended as an aid in the diagnosis of influenza from Nasopharyngeal swab specimens and should not be used as a sole basis for treatment. Nasal washings and aspirates are unacceptable for Xpert Xpress SARS-CoV-2/FLU/RSV testing.  Fact Sheet for Patients: BloggerCourse.com  Fact Sheet for Healthcare Providers: SeriousBroker.it  This test is not yet approved or cleared by the [place]  FDA and has been authorized for detection and/or diagnosis of SARS-CoV-2 by FDA under an Emergency Use Authorization (EUA). This EUA will remain in effect (meaning this test can be used) for the duration of the COVID-19 declaration under Section 564(b)(1) of the Act, 21 U.S.C. section  360bbb-3(b)(1), unless the authorization is terminated or revoked.     Resp Syncytial Virus by PCR NEGATIVE NEGATIVE Final    Comment: (NOTE) Fact Sheet for Patients: BloggerCourse.com  Fact Sheet for Healthcare Providers: SeriousBroker.it  This test is not yet approved or cleared by the [place]  FDA and has been authorized for detection and/or diagnosis of SARS-CoV-2 by FDA under an Emergency Use Authorization (EUA). This EUA will remain in effect (meaning this test can be used) for the duration of the COVID-19 declaration under Section 564(b)(1) of the Act, 21 U.S.C. section 360bbb-3(b)(1), unless the authorization is terminated or revoked.  Performed at [facility], [address],  [place], [place] [address]          Radiology Studies: MR BRAIN WO CONTRAST Result Date: 12/19/2023 CLINICAL DATA:  73 year old male with altered mental status. Weakness. EXAM: MRI HEAD WITHOUT CONTRAST TECHNIQUE: Multiplanar, multiecho pulse sequences of the brain and surrounding structures were obtained without intravenous contrast. COMPARISON:  Head CT yesterday. FINDINGS: Brain: No restricted diffusion to suggest acute infarction. No midline shift, mass effect, evidence of mass lesion, ventriculomegaly, extra-axial collection or acute intracranial hemorrhage. Cervicomedullary junction and pituitary are within normal limits. Cerebral volume is within normal limits for age. Scattered nonspecific rep at scattered cerebral white matter T2 and FLAIR hyperintensity, including in some of the deep white matter capsules greater on the left. Extent is mild to moderate for age. No cortical encephalomalacia identified. Small chronic microhemorrhage in the ventral right thalamus. Mild to moderate deep gray nuclei T2 heterogeneity otherwise. Brainstem and cerebellum appear negative for age. Vascular: Major intracranial vascular flow voids are preserved.  Skull and upper cervical spine: Partially visible cervical spine disc and endplate degeneration. Background bone marrow signal is within normal limits. Sinuses/Orbits: Negative. Other: Mastoids are clear. Visible internal auditory structures appear normal. Negative visible scalp and face. IMPRESSION: 1. No acute intracranial abnormality. 2. Up to moderate for age signal changes in the cerebral white matter and deep gray nuclei most compatible with chronic small vessel disease. Electronically Signed   By: Marlise Simpers M.D.   On: 12/19/2023 09:58   US  Abdomen Limited RUQ (LIVER/GB) Result Date: 12/18/2023 CLINICAL DATA:  [id number] Transaminitis [id number] EXAM: ULTRASOUND ABDOMEN LIMITED COMPARISON:  03/28/2017. FINDINGS: Hepatic cyst measures 1.8 cm. no additional hepatic lesions or biliary ductal dilatation. Hepatopetal portal vein flow. Gallbladder is contracted. No stones or pericholecystic fluid. CBD measured 0.5cm. IMPRESSION: Hepatic cyst. Otherwise unremarkable examination. Electronically Signed   By: Sydell Eva M.D.   On: 12/18/2023 18:56   CT Head Wo Contrast Result Date: 12/18/2023 CLINICAL DATA:  Mental status change, unknown cause EXAM: CT HEAD WITHOUT CONTRAST TECHNIQUE: Contiguous axial images were obtained from the base of the skull through the vertex without intravenous contrast. RADIATION DOSE REDUCTION: This exam was performed according to the departmental dose-optimization program which includes automated exposure control, adjustment of the mA and/or kV according to patient size and/or use of iterative reconstruction technique. COMPARISON:  None Available. FINDINGS: Brain: There is periventricular white matter decreased attenuation consistent with small vessel ischemic changes. Ventricles, sulci and cisterns are prominent consistent with age related involutional changes. No acute intracranial hemorrhage, mass effect or shift. No hydrocephalus. Vascular: No hyperdense vessel or unexpected  calcification. Skull: Normal. Negative for fracture or focal lesion. Sinuses/Orbits: No acute finding. IMPRESSION: Atrophy and chronic small vessel ischemic changes. No acute intracranial process identified. Electronically Signed   By: Sydell Eva M.D.   On: 12/18/2023 12:50   DG Chest Portable 1 View Result Date: 12/18/2023 CLINICAL DATA:  fever, SOB EXAM: PORTABLE CHEST 1 VIEW COMPARISON:  December 11, 2021 FINDINGS: The cardiomediastinal silhouette is unchanged in contour.LEFT chest AICD. No pleural effusion. No pneumothorax. No acute pleuroparenchymal abnormality. IMPRESSION: No acute cardiopulmonary abnormality. Electronically Signed   By: Clancy Crimes M.D.   On: 12/18/2023 12:19           LOS: 1 day   Time spent= 35 mins    Maggie Schooner, MD [facility]  If 7PM-7AM, please contact night-coverage  12/19/2023, 1:39 PM

## 2023-12-19 NOTE — TOC Initial Note (Signed)
 Transition of Care [facility]) - Initial/Assessment Note    Patient Details  Name: John Keith MRN: [id number] Date of Birth: 08/09/1950  Transition of Care [facility]) CM/SW Contact:    Elspeth Hals, LCSW Phone Number: 12/19/2023, 4:01 PM  Clinical Narrative:   Pt oriented x2, able to answer a few questions.  All information from wife Annabell Baron, son Sammy in room.  Pt from home with wife, no current services.  Discussed PT recommendation for SNF and they are agreeable, requesting [facility].  Medicare choice document provided.  Referral sent out in hub for SNF.                  Expected Discharge Plan: Skilled Nursing Facility Barriers to Discharge: Continued Medical Work up, SNF Pending bed offer   Patient Goals and CMS Choice Patient states their goals for this hospitalization and ongoing recovery are:: back to normal CMS Medicare.gov Compare Post Acute Care list provided to:: Patient Represenative (must comment) (wife Gwen) Choice offered to / list presented to : Spouse, Adult Children (son Sammy)      Expected Discharge Plan and Services In-house Referral: Clinical Social Work   Post Acute Care Choice: Skilled Nursing Facility Living arrangements for the past 2 months: Single Family Home                                      Prior Living Arrangements/Services Living arrangements for the past 2 months: Single Family Home Lives with:: Spouse Patient language and need for interpreter reviewed:: Yes        Need for Family Participation in Patient Care: Yes (Comment) Care giver support system in place?: Yes (comment) Current home services: Other (comment) (none) Criminal Activity/Legal Involvement Pertinent to Current Situation/Hospitalization: No - Comment as needed  Activities of Daily Living   ADL Screening (condition at time of admission) Independently performs ADLs?: No Does the patient have a NEW difficulty with bathing/dressing/toileting/self-feeding that  is expected to last >3 days?: Yes (Initiates electronic notice to provider for possible OT consult) Does the patient have a NEW difficulty with getting in/out of bed, walking, or climbing stairs that is expected to last >3 days?: Yes (Initiates electronic notice to provider for possible PT consult) Does the patient have a NEW difficulty with communication that is expected to last >3 days?: Yes (Initiates electronic notice to provider for possible SLP consult) Is the patient deaf or have difficulty hearing?: No Does the patient have difficulty seeing, even when wearing glasses/contacts?: No Does the patient have difficulty concentrating, remembering, or making decisions?: Yes  Permission Sought/Granted                  Emotional Assessment Appearance:: Appears stated age Attitude/Demeanor/Rapport: Engaged Affect (typically observed): Pleasant Orientation: : Oriented to Self, Oriented to Place      Admission diagnosis:  Confusion [R41.0] Acute encephalopathy [G93.40] Generalized weakness [R53.1] Urinary tract infection without hematuria, site unspecified [N39.0] Patient Active Problem List   Diagnosis Date Noted   Acute encephalopathy 12/18/2023   On anticoagulant therapy 08/29/2023   On amiodarone  therapy 08/29/2023   Dermatitis 01/31/2023   Onychomycosis 01/31/2023   Statin myopathy 11/15/2022   Abnormal glucose 11/15/2022   Prostate cancer screening 11/15/2022   Chronic systolic heart failure (HCC) 12/15/2021   Chronic combined systolic and diastolic CHF (congestive heart failure) (HCC) 12/15/2021   Nasal sore 12/15/2021   Syncope 12/07/2021  Ventricular tachycardia (HCC) 12/07/2021   Typical atrial flutter (HCC) 09/24/2021   Secondary hypercoagulable state (HCC) 09/24/2021   Abnormal x-ray of neck 06/16/2021   Abnormal MRI, lumbar spine 10/23/2020   Chronic low back pain 10/23/2020   Neuropathy 10/23/2020   Coronary artery disease of native artery of native heart  with stable angina pectoris (HCC) 10/23/2020   Impingement syndrome of left shoulder region 08/22/2020   Pain in joint of left shoulder 07/24/2020   Chronic left shoulder pain 07/11/2020   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   Anemia 12/13/2019   Chest pain of uncertain etiology 10/24/2019   PVC (premature ventricular contraction) 10/24/2019   Gastric ulcer without hemorrhage or perforation 09/28/2019   COVID-19 virus detected 07/12/2019   Elevated troponin 07/07/2019   Elevated serum protein level 07/07/2019   Postherpetic neuralgia 08/17/2018   Benign prostatic hyperplasia 08/01/2018   Hip pain 06/29/2018   Aortic atherosclerosis (HCC) 09/22/2016   Fatigue 06/03/2016   Lower urinary tract symptoms (LUTS) 06/03/2016   Constipation 03/10/2016   Lumbar radiculopathy 02/20/2016   Encounter for immunization 11/26/2015   Insomnia 06/10/2015   External hemorrhoid 03/07/2015   Health care maintenance 01/09/2014   Carpal tunnel syndrome of left wrist 01/03/2013   Hyperlipidemia 11/09/2012   Vitamin D  deficiency 11/09/2012   Essential hypertension 11/06/2012   Tobacco abuse 11/06/2012   Seasonal allergies 11/06/2012   Erectile dysfunction 11/06/2012   GERD (gastroesophageal reflux disease) 11/06/2012   DDD (degenerative disc disease), thoracolumbar 11/06/2012   Lactose intolerance 11/06/2012   Diverticulosis of colon without hemorrhage 11/06/2012   Diverticulosis of colon 11/06/2012   Degeneration of thoracolumbar intervertebral disc 11/06/2012   Gastroesophageal reflux disease 11/06/2012   Tobacco user 11/06/2012   PCP:  Valli Gaw, MD Pharmacy:   [phone] [place], [place] - [address] AT [facility] [address] [place] [address] Phone: [phone] Fax: [phone]  [facility] [address] [place], [place] - [address] AT [facility] OF [address] [place] [address] Phone: [phone] Fax:  [phone]  [place] [facility] [address] [place] [address] Phone: [phone] Fax: [phone]     Social Drivers of Health (SDOH) Social History: SDOH Screenings   Food Insecurity: No Food Insecurity (12/18/2023)  Housing: Low Risk  (12/18/2023)  Transportation Needs: No Transportation Needs (12/18/2023)  Utilities: Not At Risk (12/18/2023)  Alcohol Screen: Low Risk  (11/22/2022)  Depression (PHQ2-9): Low Risk  (08/29/2023)  Financial Resource Strain: Low Risk  (11/22/2022)  Physical Activity: Sufficiently Active (11/22/2022)  Social Connections: Patient Declined (12/18/2023)  Stress: No Stress Concern Present (11/22/2022)  Tobacco Use: Medium Risk (12/18/2023)   SDOH Interventions: Transportation Interventions: Inpatient TOC   Readmission Risk Interventions    12/19/2023    2:27 PM  Readmission Risk Prevention Plan  Post Dischage Appt Complete  Medication Screening Complete  Transportation Screening Complete

## 2023-12-19 NOTE — TOC CM/SW Note (Signed)
 Transition of Care [facility]) - Inpatient Brief Assessment   Patient Details  Name: John Keith MRN: [id number] Date of Birth: December 23, 1950  Transition of Care [facility]) CM/SW Contact:    Tom-Johnson, Angelique Ken, RN Phone Number: 12/19/2023, 2:27 PM   Clinical Narrative:  Patient presented to the ED with increased Confusion, fatigue, Back pain and decreased Appetite. Admitted with Acute Encephalopathy. UA positive for UTI, on IV abx.   CM spoke with patient at bedside about needs for post hospital transition. From home with his wife, has two supportive children. Independent with care prior to admit. Able to drive self and wife drives at times.  Has a cane, walker, w/c and shower seat at home.  PCP is Valli Gaw, MD and uses [facility] [place].  Awaiting PT/OT recommendation for discharge disposition.  Patient not Medically ready for discharge.  CM will continue to follow as patient progresses with care towards discharge.        Transition of Care Asessment: Insurance and Status: Insurance coverage has been reviewed Patient has primary care physician: Yes Home environment has been reviewed: Yes Prior level of function:: Modified Independent Prior/Current Home Services: No current home services Social Drivers of Health Review: SDOH reviewed no interventions necessary Readmission risk has been reviewed: Yes Transition of care needs: transition of care needs identified, TOC will continue to follow

## 2023-12-20 DIAGNOSIS — G9340 Encephalopathy, unspecified: Secondary | ICD-10-CM | POA: Diagnosis not present

## 2023-12-20 LAB — CBC
HCT: 36.4 % — ABNORMAL LOW (ref 39.0–52.0)
Hemoglobin: 11.9 g/dL — ABNORMAL LOW (ref 13.0–17.0)
MCH: 31.5 pg (ref 26.0–34.0)
MCHC: 32.7 g/dL (ref 30.0–36.0)
MCV: 96.3 fL (ref 80.0–100.0)
Platelets: 290 10*3/uL (ref 150–400)
RBC: 3.78 MIL/uL — ABNORMAL LOW (ref 4.22–5.81)
RDW: 13.7 % (ref 11.5–15.5)
WBC: 9.2 10*3/uL (ref 4.0–10.5)
nRBC: 0 % (ref 0.0–0.2)

## 2023-12-20 LAB — BASIC METABOLIC PANEL WITH GFR
Anion gap: 11 (ref 5–15)
BUN: 30 mg/dL — ABNORMAL HIGH (ref 8–23)
CO2: 26 mmol/L (ref 22–32)
Calcium: 8.7 mg/dL — ABNORMAL LOW (ref 8.9–10.3)
Chloride: 98 mmol/L (ref 98–111)
Creatinine, Ser: 1.67 mg/dL — ABNORMAL HIGH (ref 0.61–1.24)
GFR, Estimated: 43 mL/min — ABNORMAL LOW (ref >60)
Glucose, Bld: 120 mg/dL — ABNORMAL HIGH (ref 70–99)
Potassium: 3.5 mmol/L (ref 3.5–5.1)
Sodium: 135 mmol/L (ref 135–145)

## 2023-12-20 LAB — MAGNESIUM: Magnesium: 2.5 mg/dL — ABNORMAL HIGH (ref 1.7–2.4)

## 2023-12-20 MED ORDER — BOOST / RESOURCE BREEZE PO LIQD CUSTOM
1.0000 | Freq: Three times a day (TID) | ORAL | Status: DC
  Administered 2023-12-20 (×2): 1 via ORAL
  Administered 2023-12-20: 237 mL via ORAL
  Administered 2023-12-21 – 2023-12-22 (×3): 1 via ORAL

## 2023-12-20 MED ORDER — SODIUM CHLORIDE 0.9 % IV SOLN: INTRAVENOUS | Status: AC

## 2023-12-20 NOTE — Evaluation (Signed)
 Occupational Therapy Evaluation Patient Details Name: John Keith MRN: [id number] DOB: [birth year] Today's Date: 12/20/2023   History of Present Illness   John Keith is a 73 y.o. male who presents on 12/18/23 with back pain, decreased appetite, fatigue, acute encephalopathy. Presumed UTI. PMH:  Aflutter, pacer, HTN, and DDD     Clinical Impressions Patient is currently requiring as high as contact guard assistance and multimodal, frequent cues for sequencing and safety with basic ADLs, as well as  contact guard assist with functional transfers to stand from recliner and from armless chair with use of RW and multimodal cues for hand placement and safety.   Current level of function is below patient's typical baseline.    During this evaluation, patient was limited by impaired activity tolerance, poor appetite, and continued cognitive impairment, all of which has the potential to impact patient's and/or caregivers' safety and independence during functional mobility, as well as performance for ADLs.    Patient lives with his spouse who  able to provide 24/7 supervision and assistance.  Patient demonstrates good rehab potential, and should benefit from continued skilled occupational therapy services while in acute care to maximize safety, independence and quality of life at home. Continued occupational therapy services after discharge from acute care from continued inpatient follow up therapy, <3 hours/day is recommended  [facility] Home health PT and OT with 24/7 close supervision and family assist for spouse to avoid caregiver burnout. Will base on pt's progress with acute services.   ?      If plan is discharge home, recommend the following:   A little help with walking and/or transfers;A little help with bathing/dressing/bathroom;Supervision due to cognitive status;Assistance with cooking/housework;Direct supervision/assist for financial management;Assist for transportation;Help with  stairs or ramp for entrance;Direct supervision/assist for medications management     Functional Status Assessment   Patient has had a recent decline in their functional status and demonstrates the ability to make significant improvements in function in a reasonable and predictable amount of time.     Equipment Recommendations    (TBD)     Recommendations for Other Services         Precautions/Restrictions   Precautions Precautions: Fall Restrictions Weight Bearing Restrictions Per Provider Order: No     Mobility Bed Mobility               General bed mobility comments: pt received in recliner    Transfers Overall transfer level: Needs assistance Equipment used: Rolling walker (2 wheels) Transfers: Sit to/from Stand Sit to Stand: Supervision, Contact guard assist                  Balance Overall balance assessment: Needs assistance Sitting-balance support: Feet supported, No upper extremity supported Sitting balance-Leahy Scale: Fair (Post bias when raising UEs overhead.)     Standing balance support: During functional activity, Reliant on assistive device for balance, Bilateral upper extremity supported Standing balance-Leahy Scale: Poor Standing balance comment: reliant on support; uses forearms restin on RW when performing grooming at sink,.                           ADL either performed or assessed with clinical judgement   ADL Overall ADL's : Needs assistance/impaired Eating/Feeding: Supervision/ safety Eating/Feeding Details (indicate cue type and reason): Pt endorses continued poor appetite. MD to room and agreed to order pt Boost as wife reports pt is always thirsty. Grooming: Oral care;Wash/dry face;Standing;Supervision/safety;Contact guard assist;Set  up;Cueing for sequencing;Cueing for safety Grooming Details (indicate cue type and reason): Pt stood from recliner to RW with cues for hands only and SBA. Pt ambulated 8' to  sink with RW and then sat to rest in armless chair. After rest, pt stood again to sink and was unable to problem solve squeezing paste from tube with multiple attemps. Pt was able to open his new toothbrush that his wife provided with increased time/effort. Pt performed oral care in standing with cues, CGA for safety. Wiped face with cues to initiate and CGA. Upper Body Bathing: Supervision/ safety;Set up;Sitting   Lower Body Bathing: Contact guard assist;Sit to/from stand;Sitting/lateral leans;Set up;Cueing for sequencing   Upper Body Dressing : Supervision/safety;Set up;Sitting;Cueing for sequencing   Lower Body Dressing: Sitting/lateral leans Lower Body Dressing Details (indicate cue type and reason): Increased time, but pt able to perform figure 4 on each LE to doff then don socks. Pt endorsed a little tired after task.   Toilet Transfer Details (indicate cue type and reason): Pt denied need. Please see Grooming above for functional mobility related to ADLs.   Toileting - Clothing Manipulation Details (indicate cue type and reason): Currently on external cath.     Functional mobility during ADLs: Supervision/safety;Contact guard assist;Cueing for safety;Cueing for sequencing;Rolling walker (2 wheels)       Vision   Vision Assessment?: No apparent visual deficits     Perception         Praxis         Pertinent Vitals/Pain Pain Assessment Pain Assessment: No/denies pain     Extremity/Trunk Assessment Upper Extremity Assessment Upper Extremity Assessment: Overall WFL for tasks assessed   Lower Extremity Assessment Lower Extremity Assessment: Generalized weakness   Cervical / Trunk Assessment Cervical / Trunk Assessment: Other exceptions Cervical / Trunk Exceptions: h/o DDD, very tall   Communication Communication Communication: No apparent difficulties   Cognition Arousal: Alert Behavior During Therapy: Flat affect Cognition: Cognition impaired   Orientation  impairments:  (Ox4 today) Awareness: Online awareness impaired Memory impairment (select all impairments): Declarative long-term memory Attention impairment (select first level of impairment): Selective attention Executive functioning impairment (select all impairments): Problem solving, Reasoning                   Following commands: Impaired Following commands impaired: Follows one step commands with increased time, Follows multi-step commands inconsistently     Cueing  General Comments   Cueing Techniques: Verbal cues;Tactile cues      Exercises     Shoulder Instructions      Home Living                                          Prior Functioning/Environment                      OT Problem List: Impaired balance (sitting and/or standing);Decreased safety awareness;Decreased activity tolerance;Decreased cognition;Decreased knowledge of use of DME or AE   OT Treatment/Interventions: Self-care/ADL training;Balance training;Therapeutic activities;Cognitive remediation/compensation;DME and/or AE instruction;Patient/family education      OT Goals(Current goals can be found in the care plan section)   Acute Rehab OT Goals Patient Stated Goal: Per wife, for pt to come home if safe to do so. OT Goal Formulation: With patient/family Time For Goal Achievement: 01/03/24 Potential to Achieve Goals: Good ADL Goals Pt Will Perform Eating: with supervision;sitting (At  least one full meal a day with Boost for suppliments) Pt Will Perform Grooming: with modified independence;standing Pt Will Perform Lower Body Bathing: with supervision;sitting/lateral leans;with caregiver independent in assisting;with adaptive equipment Pt Will Perform Lower Body Dressing: with supervision;with caregiver independent in assisting;sitting/lateral leans;sit to/from stand Pt Will Transfer to Toilet: ambulating;with supervision Pt Will Perform Toileting - Clothing  Manipulation and hygiene: with supervision;sitting/lateral leans;sit to/from stand;with caregiver independent in assisting Additional ADL Goal #1: Pt will demonstrate improved mentation by scoring <5/10 on short blessed test and answering 4/4 safety questions from the [facility] correctly:   1. What do you do for yourself if you are sick with a cold?  2. What do you do if you burn yourself and the wound becomes infected?  3. What do you do if you experience severe chest pain and shortness of breath?   4. What number do you call in an emergency?   OT Frequency:  Min 3X/week    Co-evaluation              AM-PAC OT 6 Clicks Daily Activity     Outcome Measure Help from another person eating meals?: A Little Help from another person taking care of personal grooming?: A Little Help from another person toileting, which includes using toliet, bedpan, or urinal?: A Little Help from another person bathing (including washing, rinsing, drying)?: A Little Help from another person to put on and taking off regular upper body clothing?: A Little Help from another person to put on and taking off regular lower body clothing?: A Little 6 Click Score: 18   End of Session Equipment Utilized During Treatment: Gait belt;Rolling walker (2 wheels)  Activity Tolerance: Patient tolerated treatment well;Patient limited by fatigue Patient left: in bed;with call bell/phone within reach;with bed alarm set;with family/visitor present (MD in room)  OT Visit Diagnosis: Unsteadiness on feet (R26.81);Other symptoms and signs involving cognitive function                Time: 3086-5784 OT Time Calculation (min): 25 min Charges:  OT General Charges $OT Visit: 1 Visit OT Evaluation $OT Eval Low Complexity: 1 Low OT Treatments $Self Care/Home Management : 8-22 mins  [facility], OT Acute Rehab Services Office: [phone] 12/20/2023   Asher Blade 12/20/2023, 9:49 AM

## 2023-12-20 NOTE — Progress Notes (Signed)
 Mobility Specialist Progress Note:    12/20/23 1610  Mobility  Activity Ambulated with assistance in room  Level of Assistance Minimal assist, patient does 75% or more  Assistive Device Front wheel walker  Distance Ambulated (ft) 3 ft  Activity Response Tolerated well  Mobility Referral Yes  Mobility visit 1 Mobility  Mobility Specialist Start Time (ACUTE ONLY) [id number]  Mobility Specialist Stop Time (ACUTE ONLY) [id number]  Mobility Specialist Time Calculation (min) (ACUTE ONLY) 7 min   Received pt in bed, agreeable to mobility. Able to come EOB w/ ModI and increased time. Required MinA to stand and step pivot to chair. Asx w/ no c/o throughout. Pt left in chair w/ call bell and all needs met. Chair alarm on and family present.   Doak Free Mobility Specialist  Please contact via [employer] or  Rehab Office [phone]

## 2023-12-20 NOTE — Evaluation (Signed)
 Speech Language Pathology Evaluation Patient Details Name: John Keith MRN: [id number] DOB: 12/22/1950 Today's Date: 12/20/2023 Time: 1340-1405 SLP Time Calculation (min) (ACUTE ONLY): 25 min  Problem List:  Patient Active Problem List   Diagnosis Date Noted   Acute encephalopathy 12/18/2023   On anticoagulant therapy 08/29/2023   On amiodarone  therapy 08/29/2023   Dermatitis 01/31/2023   Onychomycosis 01/31/2023   Statin myopathy 11/15/2022   Abnormal glucose 11/15/2022   Prostate cancer screening 11/15/2022   Chronic systolic heart failure (HCC) 12/15/2021   Chronic combined systolic and diastolic CHF (congestive heart failure) (HCC) 12/15/2021   Nasal sore 12/15/2021   Syncope 12/07/2021   Ventricular tachycardia (HCC) 12/07/2021   Typical atrial flutter (HCC) 09/24/2021   Secondary hypercoagulable state (HCC) 09/24/2021   Abnormal x-ray of neck 06/16/2021   Abnormal MRI, lumbar spine 10/23/2020   Chronic low back pain 10/23/2020   Neuropathy 10/23/2020   Coronary artery disease of native artery of native heart with stable angina pectoris (HCC) 10/23/2020   Impingement syndrome of left shoulder region 08/22/2020   Pain in joint of left shoulder 07/24/2020   Chronic left shoulder pain 07/11/2020   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   Anemia 12/13/2019   Chest pain of uncertain etiology 10/24/2019   PVC (premature ventricular contraction) 10/24/2019   Gastric ulcer without hemorrhage or perforation 09/28/2019   COVID-19 virus detected 07/12/2019   Elevated troponin 07/07/2019   Elevated serum protein level 07/07/2019   Postherpetic neuralgia 08/17/2018   Benign prostatic hyperplasia 08/01/2018   Hip pain 06/29/2018   Aortic atherosclerosis (HCC) 09/22/2016   Fatigue 06/03/2016   Lower urinary tract symptoms (LUTS) 06/03/2016   Constipation 03/10/2016   Lumbar radiculopathy 02/20/2016   Encounter for immunization 11/26/2015   Insomnia 06/10/2015   External  hemorrhoid 03/07/2015   Health care maintenance 01/09/2014   Carpal tunnel syndrome of left wrist 01/03/2013   Hyperlipidemia 11/09/2012   Vitamin D  deficiency 11/09/2012   Essential hypertension 11/06/2012   Tobacco abuse 11/06/2012   Seasonal allergies 11/06/2012   Erectile dysfunction 11/06/2012   GERD (gastroesophageal reflux disease) 11/06/2012   DDD (degenerative disc disease), thoracolumbar 11/06/2012   Lactose intolerance 11/06/2012   Diverticulosis of colon without hemorrhage 11/06/2012   Diverticulosis of colon 11/06/2012   Degeneration of thoracolumbar intervertebral disc 11/06/2012   Gastroesophageal reflux disease 11/06/2012   Tobacco user 11/06/2012   Past Medical History:  Past Medical History:  Diagnosis Date   Allergy    Arthritis    Atrial flutter (HCC)    Carpal tunnel syndrome on left    Chest pain of uncertain etiology 10/24/2019   COVID-19    07/07/19   DDD (degenerative disc disease), thoracolumbar    multilevel   Degenerative joint disease of left shoulder    02/2011    Diverticulosis    left colon (2008)    ED (erectile dysfunction)    02/2011    GERD (gastroesophageal reflux disease)    H. pylori infection    Hyperlipidemia    Hypertension    Insomnia    Lactose intolerance    Low back pain    PVC (premature ventricular contraction) 10/24/2019   Shingles    07/2018   Smoking    smoking since age 58 y.o   Toe fracture, left    4th in 2014   Vitamin D  deficiency    Past Surgical History:  Past Surgical History:  Procedure Laterality Date   APPENDECTOMY     BACK SURGERY  x 2 10991 and 1995 in [place]    CARDIAC CATHETERIZATION     1999 nl   CARDIOVERSION N/A 10/19/2021   Procedure: CARDIOVERSION;  Surgeon: Jann Melody, MD;  Location: [facility] END[facility];  Service: Cardiovascular;  Laterality: N/A;   CARPAL TUNNEL RELEASE Left    COLONOSCOPY     2008 diverticulosis   ESOPHAGOGASTRODUODENOSCOPY     h/o +h. pylori   ICD IMPLANT N/A  12/10/2021   Procedure: ICD IMPLANT;  Surgeon: Lei Pump, MD;  Location: [facility] INVASIVE CV LAB;  Service: Cardiovascular;  Laterality: N/A;   LEFT HEART CATH AND CORONARY ANGIOGRAPHY N/A 12/08/2021   Procedure: LEFT HEART CATH AND CORONARY ANGIOGRAPHY;  Surgeon: Lucendia Rusk, MD;  Location: [facility] INVASIVE CV LAB;  Service: Cardiovascular;  Laterality: N/A;   OTHER SURGICAL HISTORY     heart catherization 1999 normal    SPINE SURGERY     x 2    HPI:  [name] is a 73 y.o. male who presents on 12/18/23 with back pain, decreased appetite, fatigue, acute encephalopathy. Presumed UTI. PMH:  Aflutter, pacer, HTN, and DDD   Assessment / Plan / Recommendation Clinical Impression  Patient was evaluated via the Cognistat to assess cognitive linguistic functioning. Patient with functional expressive and receptive language despite occasional need for increased processing time. Patient oriented to self, situation and location, however disoriented to time. Patient scored with mild deficits in executive functioning (reasoning, judgement), moderate deficits in attention/calculations and severe deficits in memory. Deficits in long term memory noted informally as patient unable to provide prior job history. Patient unable to recall how to contact nursing if need of assistance, however physically pressed call button when SLP gave him the call bell. Patient with presumed UTI which may be impacting performance. SLP anticipates improvement in mentation during hospital stay as presumed UTI is addressed.    SLP Assessment  SLP Recommendation/Assessment: Patient needs continued Speech Language Pathology Services SLP Visit Diagnosis: Cognitive communication deficit (R41.841)     Assistance Recommended at Discharge  TBD  Functional Status Assessment Patient has had a recent decline in their functional status and demonstrates the ability to make significant improvements in function in a reasonable and  predictable amount of time.  Frequency and Duration min 2x/week  2 weeks      SLP Evaluation Cognition  Overall Cognitive Status: Impaired/Different from baseline Arousal/Alertness: Awake/alert Orientation Level: Oriented to person;Oriented to place;Oriented to situation;Disoriented to time Year:  ( I don't know) Month:  (I don't know) Day of Week: Incorrect Attention: Sustained Sustained Attention: Impaired Sustained Attention Impairment: Verbal basic;Functional basic Memory: Impaired Memory Impairment: Decreased long term memory;Decreased short term memory Decreased Long Term Memory: Verbal basic;Functional basic Decreased Short Term Memory: Verbal basic;Functional basic Awareness: Impaired Awareness Impairment: Intellectual impairment Problem Solving: Impaired Problem Solving Impairment: Verbal basic;Functional basic       Comprehension  Auditory Comprehension Overall Auditory Comprehension: Appears within functional limits for tasks assessed Yes/No Questions: Within Functional Limits Commands: Within Functional Limits (required extra processing time with longer commands)    Expression Expression Primary Mode of Expression: Verbal Verbal Expression Overall Verbal Expression: Appears within functional limits for tasks assessed Initiation: No impairment Repetition: No impairment Naming: No impairment   Oral / Motor  Oral Motor/Sensory Function Overall Oral Motor/Sensory Function: Within functional limits Motor Speech Overall Motor Speech: Appears within functional limits for tasks assessed            Nova Evett M.A., CCC-SLP 12/20/2023, 2:13  PM

## 2023-12-20 NOTE — Progress Notes (Signed)
 PROGRESS NOTE    John Keith  ZOX:[id number] DOB: [birth year] DOA: 12/18/2023 PCP: Valli Gaw, MD    Brief Narrative:   73 y.o. male with medical history significant of Aflutter, HTN, and DDD p/w acute encephalopathy.  Earlier in the week started having back pain therefore went to urgent care where he was prescribed tramadol  and Robaxin which she has been taking daily.  During this time became increasingly confused, upon admission CT head negative.  Diagnosed with UTI, started on Rocephin .  Mentation slowly started improving.  Assessment & Plan:  Principal Problem:   Acute encephalopathy   Acute toxic encephalopathy - I suspect medication related, allow medication to washout.  CT of the head is negative.  Supportive care.  IV fluids.  Folate, B12 and ammonia are normal.  Low TSH, free T4 borderline elevated.  Recheck in 3-4 weeks outpatient  Urinary tract infection - Empiric IV Rocephin   Mild transaminitis - Right upper quadrant ultrasound is negative.  Follow-up outpatient  Atrial fibrillation/flutter - Continue Coreg , Eliquis   Essential hypertension -Cortrak, IV as needed  DVT prophylaxis: apixaban  (ELIQUIS ) tablet 5 mg      Code Status: Full Code Family Communication:   Ongoing treatment for UTI.  Hopefully discharge tomorrow   Subjective: Patient feels much better today.  Spouse is at bedside who confirms this as well. Examination:  General exam: Appears calm and comfortable  Respiratory system: Clear to auscultation. Respiratory effort normal. Cardiovascular system: S1 & S2 heard, RRR. No JVD, murmurs, rubs, gallops or clicks. No pedal edema. Gastrointestinal system: Abdomen is nondistended, soft and nontender. No organomegaly or masses felt. Normal bowel sounds heard. Central nervous system: Alert and oriented to name and place Extremities: Symmetric 5 x 5 power. Skin: No rashes, lesions or ulcers Psychiatry: Judgement and insight appear normal. Mood &  affect appropriate.                Diet Orders (From admission, onward)     Start     Ordered   12/18/23 1534  Diet regular Room service appropriate? Yes; Fluid consistency: Thin  Diet effective now       Question Answer Comment  Room service appropriate? Yes   Fluid consistency: Thin      12/18/23 1533            Objective: Vitals:   12/19/23 1456 12/19/23 2131 12/20/23 0428 12/20/23 0706  BP: 116/70 111/62 121/69 120/66  Pulse: 78 71 73 80  Resp: 18 14 18 18   Temp: (!) 100.6 F (38.1 C) 99 F (37.2 C) 98 F (36.7 C) 99 F (37.2 C)  TempSrc: Oral Oral Oral Oral  SpO2: 93% 93% 93% 92%  Weight:      Height:        Intake/Output Summary (Last 24 hours) at 12/20/2023 1046 Last data filed at 12/19/2023 1500 Gross per 24 hour  Intake 335.53 ml  Output 550 ml  Net -214.47 ml   Filed Weights   12/18/23 1119 12/18/23 1622  Weight: 105.2 kg 104.6 kg    Scheduled Meds:  apixaban   5 mg Oral BID   carvedilol   6.25 mg Oral BID WC   feeding supplement  1 Container Oral TID BM   pantoprazole   40 mg Oral Daily   Continuous Infusions:  sodium chloride  75 mL/hr at 12/20/23 0833   cefTRIAXone  (ROCEPHIN )  IV 2 g (12/19/23 1219)    Nutritional status     Body mass index is 24.71 kg/m.  Data  Reviewed:   CBC: Recent Labs  Lab 12/18/23 1128 12/18/23 1555 12/19/23 0523 12/20/23 0550  WBC 10.5 10.6* 10.7* 9.2  HGB 12.2* 12.2* 12.2* 11.9*  HCT 37.5* 37.2* 37.7* 36.4*  MCV 99.2 98.9 98.7 96.3  PLT 281 295 275 290   Basic Metabolic Panel: Recent Labs  Lab 12/18/23 1128 12/18/23 1555 12/19/23 0523 12/20/23 0550  NA 133*  --  135 135  K 4.1  --  3.7 3.5  CL 98  --  98 98  CO2 23  --  25 26  GLUCOSE 118*  --  122* 120*  BUN 17  --  23 30*  CREATININE 1.35* 1.27* 1.50* 1.67*  CALCIUM  9.2  --  9.0 8.7*  MG  --   --   --  2.5*   GFR: Estimated Creatinine Clearance: 54 mL/min (A) (by C-G formula based on SCr of 1.67 mg/dL (H)). Liver Function  Tests: Recent Labs  Lab 12/18/23 1128  AST 83*  ALT 67*  ALKPHOS 60  BILITOT 1.1  PROT 7.8  ALBUMIN 3.4*   No results for input(s): LIPASE, AMYLASE in the last 168 hours. Recent Labs  Lab 12/19/23 1121  AMMONIA <13   Coagulation Profile: No results for input(s): INR, PROTIME in the last 168 hours. Cardiac Enzymes: No results for input(s): CKTOTAL, CKMB, CKMBINDEX, TROPONINI in the last 168 hours. BNP (last 3 results) No results for input(s): PROBNP in the last 8760 hours. HbA1C: No results for input(s): HGBA1C in the last 72 hours. CBG: Recent Labs  Lab 12/18/23 1121  GLUCAP 129*   Lipid Profile: No results for input(s): CHOL, HDL, LDLCALC, TRIG, CHOLHDL, LDLDIRECT in the last 72 hours. Thyroid  Function Tests: Recent Labs    12/19/23 0523 12/19/23 0849  TSH 0.274*  --   FREET4  --  1.31*   Anemia Panel: Recent Labs    12/19/23 0523  VITAMINB12 1,251*  FOLATE 30.4   Sepsis Labs: No results for input(s): PROCALCITON, LATICACIDVEN in the last 168 hours.  Recent Results (from the past 240 hours)  Resp panel by RT-PCR (RSV, Flu A&B, Covid) Anterior Nasal Swab     Status: None   Collection Time: 12/18/23 11:37 AM   Specimen: Anterior Nasal Swab  Result Value Ref Range Status   SARS Coronavirus 2 by RT PCR NEGATIVE NEGATIVE Final   Influenza A by PCR NEGATIVE NEGATIVE Final   Influenza B by PCR NEGATIVE NEGATIVE Final    Comment: (NOTE) The Xpert Xpress SARS-CoV-2/FLU/RSV plus assay is intended as an aid in the diagnosis of influenza from Nasopharyngeal swab specimens and should not be used as a sole basis for treatment. Nasal washings and aspirates are unacceptable for Xpert Xpress SARS-CoV-2/FLU/RSV testing.  Fact Sheet for Patients: BloggerCourse.com  Fact Sheet for Healthcare Providers: SeriousBroker.it  This test is not yet approved or cleared by the [place]   FDA and has been authorized for detection and/or diagnosis of SARS-CoV-2 by FDA under an Emergency Use Authorization (EUA). This EUA will remain in effect (meaning this test can be used) for the duration of the COVID-19 declaration under Section 564(b)(1) of the Act, 21 U.S.C. section 360bbb-3(b)(1), unless the authorization is terminated or revoked.     Resp Syncytial Virus by PCR NEGATIVE NEGATIVE Final    Comment: (NOTE) Fact Sheet for Patients: BloggerCourse.com  Fact Sheet for Healthcare Providers: SeriousBroker.it  This test is not yet approved or cleared by the [place]  FDA and has been authorized for detection and/or diagnosis  of SARS-CoV-2 by FDA under an Emergency Use Authorization (EUA). This EUA will remain in effect (meaning this test can be used) for the duration of the COVID-19 declaration under Section 564(b)(1) of the Act, 21 U.S.C. section 360bbb-3(b)(1), unless the authorization is terminated or revoked.  Performed at [facility], [address], [place], [place] [address]          Radiology Studies: MR BRAIN WO CONTRAST Result Date: 12/19/2023 CLINICAL DATA:  73 year old male with altered mental status. Weakness. EXAM: MRI HEAD WITHOUT CONTRAST TECHNIQUE: Multiplanar, multiecho pulse sequences of the brain and surrounding structures were obtained without intravenous contrast. COMPARISON:  Head CT yesterday. FINDINGS: Brain: No restricted diffusion to suggest acute infarction. No midline shift, mass effect, evidence of mass lesion, ventriculomegaly, extra-axial collection or acute intracranial hemorrhage. Cervicomedullary junction and pituitary are within normal limits. Cerebral volume is within normal limits for age. Scattered nonspecific rep at scattered cerebral white matter T2 and FLAIR hyperintensity, including in some of the deep white matter capsules greater on the left. Extent is mild to  moderate for age. No cortical encephalomalacia identified. Small chronic microhemorrhage in the ventral right thalamus. Mild to moderate deep gray nuclei T2 heterogeneity otherwise. Brainstem and cerebellum appear negative for age. Vascular: Major intracranial vascular flow voids are preserved. Skull and upper cervical spine: Partially visible cervical spine disc and endplate degeneration. Background bone marrow signal is within normal limits. Sinuses/Orbits: Negative. Other: Mastoids are clear. Visible internal auditory structures appear normal. Negative visible scalp and face. IMPRESSION: 1. No acute intracranial abnormality. 2. Up to moderate for age signal changes in the cerebral white matter and deep gray nuclei most compatible with chronic small vessel disease. Electronically Signed   By: Marlise Simpers M.D.   On: 12/19/2023 09:58   US  Abdomen Limited RUQ (LIVER/GB) Result Date: 12/18/2023 CLINICAL DATA:  [id number] Transaminitis [id number] EXAM: ULTRASOUND ABDOMEN LIMITED COMPARISON:  03/28/2017. FINDINGS: Hepatic cyst measures 1.8 cm. no additional hepatic lesions or biliary ductal dilatation. Hepatopetal portal vein flow. Gallbladder is contracted. No stones or pericholecystic fluid. CBD measured 0.5cm. IMPRESSION: Hepatic cyst. Otherwise unremarkable examination. Electronically Signed   By: Sydell Eva M.D.   On: 12/18/2023 18:56   CT Head Wo Contrast Result Date: 12/18/2023 CLINICAL DATA:  Mental status change, unknown cause EXAM: CT HEAD WITHOUT CONTRAST TECHNIQUE: Contiguous axial images were obtained from the base of the skull through the vertex without intravenous contrast. RADIATION DOSE REDUCTION: This exam was performed according to the departmental dose-optimization program which includes automated exposure control, adjustment of the mA and/or kV according to patient size and/or use of iterative reconstruction technique. COMPARISON:  None Available. FINDINGS: Brain: There is periventricular white  matter decreased attenuation consistent with small vessel ischemic changes. Ventricles, sulci and cisterns are prominent consistent with age related involutional changes. No acute intracranial hemorrhage, mass effect or shift. No hydrocephalus. Vascular: No hyperdense vessel or unexpected calcification. Skull: Normal. Negative for fracture or focal lesion. Sinuses/Orbits: No acute finding. IMPRESSION: Atrophy and chronic small vessel ischemic changes. No acute intracranial process identified. Electronically Signed   By: Sydell Eva M.D.   On: 12/18/2023 12:50   DG Chest Portable 1 View Result Date: 12/18/2023 CLINICAL DATA:  fever, SOB EXAM: PORTABLE CHEST 1 VIEW COMPARISON:  December 11, 2021 FINDINGS: The cardiomediastinal silhouette is unchanged in contour.LEFT chest AICD. No pleural effusion. No pneumothorax. No acute pleuroparenchymal abnormality. IMPRESSION: No acute cardiopulmonary abnormality. Electronically Signed   By: Clancy Crimes M.D.  On: 12/18/2023 12:19           LOS: 2 days   Time spent= 35 mins    Maggie Schooner, MD [facility]  If 7PM-7AM, please contact night-coverage  12/20/2023, 10:46 AM

## 2023-12-20 NOTE — TOC Progression Note (Signed)
 Transition of Care [facility]) - Progression Note    Patient Details  Name: John Keith MRN: [id number] Date of Birth: 08-10-1950  [facility]) CM/SW Contact  Tandy Fam, [place] Phone Number: 12/20/2023, 4:18 PM  Clinical Narrative:   CSW updated by [facility] that they can offer a bed. Per MD, patient may be ready for discharge tomorrow. CSW contacted [employer] Authorization, initiated auth for SNF and PTAR. Awaiting response.  UPDATE: CSW called with approval for SNF (Auth ID [id number]). Auth for PTAR went to med review, won't have a decision until tomorrow. CSW to follow.    Expected Discharge Plan: Skilled Nursing Facility Barriers to Discharge: Continued Medical Work up, English as a second language teacher  Expected Discharge Plan and Services In-house Referral: Clinical Social Work   Post Acute Care Choice: Skilled Nursing Facility Living arrangements for the past 2 months: Single Family Home                                       Social Determinants of Health (SDOH) Interventions SDOH Screenings   Food Insecurity: No Food Insecurity (12/18/2023)  Housing: Low Risk  (12/18/2023)  Transportation Needs: No Transportation Needs (12/18/2023)  Utilities: Not At Risk (12/18/2023)  Alcohol Screen: Low Risk  (11/22/2022)  Depression (PHQ2-9): Low Risk  (08/29/2023)  Financial Resource Strain: Low Risk  (11/22/2022)  Physical Activity: Sufficiently Active (11/22/2022)  Social Connections: Patient Declined (12/18/2023)  Stress: No Stress Concern Present (11/22/2022)  Tobacco Use: Medium Risk (12/18/2023)    Readmission Risk Interventions    12/19/2023    2:27 PM  Readmission Risk Prevention Plan  Post Dischage Appt Complete  Medication Screening Complete  Transportation Screening Complete

## 2023-12-20 NOTE — Plan of Care (Signed)

## 2023-12-20 NOTE — TOC Progression Note (Signed)
 Transition of Care [facility]) - Progression Note    Patient Details  Name: John Keith MRN: [id number] Date of Birth: 12/28/1950  Transition of Care [facility]) CM/SW Contact  Tandy Fam, [place] Phone Number: 12/20/2023, 2:19 PM  Clinical Narrative:   CSW following for disposition. Referral to [facility] remains pending. CSW asked [facility] to review, awaiting response. CSW to follow.    Expected Discharge Plan: Skilled Nursing Facility Barriers to Discharge: Continued Medical Work up, SNF Pending bed offer  Expected Discharge Plan and Services In-house Referral: Clinical Social Work   Post Acute Care Choice: Skilled Nursing Facility Living arrangements for the past 2 months: Single Family Home                                       Social Determinants of Health (SDOH) Interventions SDOH Screenings   Food Insecurity: No Food Insecurity (12/18/2023)  Housing: Low Risk  (12/18/2023)  Transportation Needs: No Transportation Needs (12/18/2023)  Utilities: Not At Risk (12/18/2023)  Alcohol Screen: Low Risk  (11/22/2022)  Depression (PHQ2-9): Low Risk  (08/29/2023)  Financial Resource Strain: Low Risk  (11/22/2022)  Physical Activity: Sufficiently Active (11/22/2022)  Social Connections: Patient Declined (12/18/2023)  Stress: No Stress Concern Present (11/22/2022)  Tobacco Use: Medium Risk (12/18/2023)    Readmission Risk Interventions    12/19/2023    2:27 PM  Readmission Risk Prevention Plan  Post Dischage Appt Complete  Medication Screening Complete  Transportation Screening Complete

## 2023-12-21 ENCOUNTER — Inpatient Hospital Stay (HOSPITAL_COMMUNITY)

## 2023-12-21 DIAGNOSIS — G9340 Encephalopathy, unspecified: Secondary | ICD-10-CM | POA: Diagnosis not present

## 2023-12-21 LAB — BASIC METABOLIC PANEL WITH GFR
Anion gap: 9 (ref 5–15)
BUN: 21 mg/dL (ref 8–23)
CO2: 24 mmol/L (ref 22–32)
Calcium: 8.3 mg/dL — ABNORMAL LOW (ref 8.9–10.3)
Chloride: 101 mmol/L (ref 98–111)
Creatinine, Ser: 1.24 mg/dL (ref 0.61–1.24)
GFR, Estimated: >60 mL/min
Glucose, Bld: 109 mg/dL — ABNORMAL HIGH (ref 70–99)
Potassium: 3.4 mmol/L — ABNORMAL LOW (ref 3.5–5.1)
Sodium: 134 mmol/L — ABNORMAL LOW (ref 135–145)

## 2023-12-21 LAB — CBC
HCT: 35.6 % — ABNORMAL LOW (ref 39.0–52.0)
Hemoglobin: 11.5 g/dL — ABNORMAL LOW (ref 13.0–17.0)
MCH: 31.4 pg (ref 26.0–34.0)
MCHC: 32.3 g/dL (ref 30.0–36.0)
MCV: 97.3 fL (ref 80.0–100.0)
Platelets: 289 K/uL (ref 150–400)
RBC: 3.66 MIL/uL — ABNORMAL LOW (ref 4.22–5.81)
RDW: 13.8 % (ref 11.5–15.5)
WBC: 7.9 K/uL (ref 4.0–10.5)
nRBC: 0 % (ref 0.0–0.2)

## 2023-12-21 LAB — MAGNESIUM: Magnesium: 2.4 mg/dL (ref 1.7–2.4)

## 2023-12-21 MED ORDER — IPRATROPIUM-ALBUTEROL 0.5-2.5 (3) MG/3ML IN SOLN: 3.0000 mL | RESPIRATORY_TRACT | Status: DC | PRN

## 2023-12-21 MED ORDER — SODIUM CHLORIDE 0.9% FLUSH: 10.0000 mL | INTRAVENOUS | Status: DC | PRN

## 2023-12-21 MED ORDER — IOHEXOL 350 MG/ML SOLN
75.0000 mL | Freq: Once | INTRAVENOUS | Status: AC | PRN
  Administered 2023-12-21: 75 mL via INTRAVENOUS

## 2023-12-21 MED ORDER — HYDRALAZINE HCL 20 MG/ML IJ SOLN: 10.0000 mg | INTRAMUSCULAR | Status: DC | PRN

## 2023-12-21 MED ORDER — POTASSIUM CHLORIDE CRYS ER 20 MEQ PO TBCR
40.0000 meq | EXTENDED_RELEASE_TABLET | Freq: Once | ORAL | Status: AC
  Administered 2023-12-21: 40 meq via ORAL
  Filled 2023-12-21: qty 2

## 2023-12-21 MED ORDER — METOPROLOL TARTRATE 5 MG/5ML IV SOLN: 5.0000 mg | INTRAVENOUS | Status: DC | PRN

## 2023-12-21 MED ORDER — SODIUM CHLORIDE 0.9% FLUSH
10.0000 mL | Freq: Two times a day (BID) | INTRAVENOUS | Status: DC
  Administered 2023-12-21 – 2023-12-22 (×2): 10 mL

## 2023-12-21 NOTE — Progress Notes (Signed)
 Occupational Therapy Treatment Patient Details Name: John Keith MRN: [id number] DOB: 09-16-1950 Today's Date: 12/21/2023   History of present illness John Keith is a 73 y.o. male who presents on 12/18/23 with back pain, decreased appetite, fatigue, acute encephalopathy. Presumed UTI. PMH:  Aflutter, pacer, HTN, and DDD   OT comments  Pt making good progress with functional goals. Pt's wife present and supportive. Pt/family planning to return home with [facility] services. OT will continue to follow acutely to maximize level of function and safety      If plan is discharge home, recommend the following:  A little help with walking and/or transfers;A little help with bathing/dressing/bathroom;Supervision due to cognitive status;Assistance with cooking/housework;Direct supervision/assist for financial management;Assist for transportation;Help with stairs or ramp for entrance;Direct supervision/assist for medications management   Equipment Recommendations  Tub/shower seat    Recommendations for Other Services      Precautions / Restrictions Precautions Precautions: Fall Restrictions Weight Bearing Restrictions Per Provider Order: No       Mobility Bed Mobility Overal bed mobility: Needs Assistance Bed Mobility: Supine to Sit     Supine to sit: Supervision          Transfers Overall transfer level: Needs assistance Equipment used: Rolling walker (2 wheels) Transfers: Sit to/from Stand Sit to Stand: Contact guard assist           General transfer comment: cues for safe hand placement     Balance Overall balance assessment: Needs assistance Sitting-balance support: Feet supported, No upper extremity supported Sitting balance-Leahy Scale: Good     Standing balance support: During functional activity, Reliant on assistive device for balance, Bilateral upper extremity supported Standing balance-Leahy Scale: Fair                             ADL either  performed or assessed with clinical judgement   ADL Overall ADL's : Needs assistance/impaired     Grooming: Oral care;Wash/dry face;Wash/dry hands;Contact guard assist;Standing;Cueing for safety;With caregiver independent assisting       Lower Body Bathing: Contact guard assist;Sit to/from stand;Sitting/lateral leans;Cueing for sequencing       Lower Body Dressing: Contact guard assist;Sitting/lateral leans;Sit to/from stand;With caregiver independent assisting   Toilet Transfer: Supervision/safety;Ambulation;Rolling walker (2 wheels);Cueing for safety;Regular Toilet;BSC/3in1;Grab bars   Toileting- Clothing Manipulation and Hygiene: Contact guard assist;Sit to/from stand   Tub/ Shower Transfer: Contact guard assist;Ambulation;Rolling walker (2 wheels);BSC/3in1;Grab bars   Functional mobility during ADLs: Supervision/safety;Contact guard assist;Cueing for safety;Rolling walker (2 wheels);Caregiver able to provide necessary level of assistance      Extremity/Trunk Assessment Upper Extremity Assessment Upper Extremity Assessment: Overall WFL for tasks assessed   Lower Extremity Assessment Lower Extremity Assessment: Defer to PT evaluation        Vision Ability to See in Adequate Light: 0 Adequate Patient Visual Report: No change from baseline     Perception     Praxis     Communication Communication Communication: No apparent difficulties   Cognition Arousal: Alert Behavior During Therapy: Flat affect                                 Following commands: Impaired        Cueing   Cueing Techniques: Verbal cues, Tactile cues  Exercises      Shoulder Instructions       General Comments  Pertinent Vitals/ Pain       Pain Assessment Pain Assessment: Faces Faces Pain Scale: Hurts a little bit Pain Location: back Pain Descriptors / Indicators: Aching, Discomfort Pain Intervention(s): Monitored during session, Repositioned  Home Living                                           Prior Functioning/Environment              Frequency  Min 2X/week        Progress Toward Goals  OT Goals(current goals can now be found in the care plan section)  Progress towards OT goals: Progressing toward goals     Plan      Co-evaluation                 AM-PAC OT 6 Clicks Daily Activity     Outcome Measure   Help from another person eating meals?: None Help from another person taking care of personal grooming?: A Little Help from another person toileting, which includes using toliet, bedpan, or urinal?: A Little Help from another person bathing (including washing, rinsing, drying)?: A Little Help from another person to put on and taking off regular upper body clothing?: A Little Help from another person to put on and taking off regular lower body clothing?: A Little 6 Click Score: 19    End of Session Equipment Utilized During Treatment: Gait belt;Rolling walker (2 wheels)  OT Visit Diagnosis: Unsteadiness on feet (R26.81);Other symptoms and signs involving cognitive function   Activity Tolerance Patient tolerated treatment well   Patient Left in bed;with call bell/phone within reach;with family/visitor present;Other (comment) (sitting EOB)   Nurse Communication Mobility status        Time: 1610-9604 OT Time Calculation (min): 33 min  Charges: OT General Charges $OT Visit: 1 Visit OT Treatments $Self Care/Home Management : 8-22 mins $Therapeutic Activity: 8-22 mins    John Keith 12/21/2023, 1:22 PM

## 2023-12-21 NOTE — Progress Notes (Signed)
 Physical Therapy Treatment Patient Details Name: John Keith MRN: [id number] DOB: October 07, 1950 Today's Date: 12/21/2023   History of Present Illness John Keith is a 73 y.o. male who presents on 12/18/23 with back pain, decreased appetite, fatigue, acute encephalopathy. Presumed UTI. PMH:  Aflutter, pacer, HTN, and DDD    PT Comments  Pt seen for PT tx with pt's wife present in room. Pt with noted overall improvement on this date, AxOx3, follows commands with extra time, flat affect. Pt is able to complete sit>stand from EOB with cuing re: hand placement, ambulate 200 ft in hallway with RW & CGA. PT provides ongoing cuing re: upright posture & to ambulate within base of AD vs pushing it out in front but poor return demo; pt noting back pain. At this time, upgraded d/c recommendations to home with HHPT f/u & pt & wife agreeable. Will continue to follow pt acutely to progress gait with LRAD, balance, awareness.    If plan is discharge home, recommend the following: A little help with walking and/or transfers;A little help with bathing/dressing/bathroom;Assistance with cooking/housework;Assist for transportation;Help with stairs or ramp for entrance   Can travel by private vehicle     Yes  Equipment Recommendations  Rolling walker (2 wheels) (tall RW)    Recommendations for Other Services       Precautions / Restrictions Precautions Precautions: Fall Restrictions Weight Bearing Restrictions Per Provider Order: No     Mobility  Bed Mobility               General bed mobility comments: not tested, pt received & left sitting EOB    Transfers   Equipment used: Rolling walker (2 wheels) Transfers: Sit to/from Stand Sit to Stand: Supervision           General transfer comment: education re: hand placement    Ambulation/Gait Ambulation/Gait assistance: Contact guard assist Gait Distance (Feet): 200 Feet Assistive device: Rolling walker (2 wheels) Gait  Pattern/deviations: Decreased step length - left, Decreased step length - right, Decreased stride length Gait velocity: decreased     General Gait Details: Forward trunk lean on RW despite cuing for upright posture   Stairs             Wheelchair Mobility     Tilt Bed    Modified Rankin (Stroke Patients Only)       Balance Overall balance assessment: Needs assistance Sitting-balance support: Feet supported, No upper extremity supported Sitting balance-Leahy Scale: Good Sitting balance - Comments: sitting EOB   Standing balance support: During functional activity, Reliant on assistive device for balance, Bilateral upper extremity supported Standing balance-Leahy Scale: Fair                              Hotel manager: No apparent difficulties  Cognition Arousal: Alert Behavior During Therapy: Flat affect   PT - Cognitive impairments: Orientation                       PT - Cognition Comments: Pt oriented to self, time, place, not fully aware to situation. Flat affect, extra time to respond. Following commands: Impaired Following commands impaired: Follows one step commands with increased time, Follows multi-step commands with increased time    Cueing Cueing Techniques: Verbal cues, Tactile cues  Exercises      General Comments        Pertinent Vitals/Pain Pain Assessment Pain Assessment: Faces  Faces Pain Scale: Hurts a little bit Pain Location: back Pain Descriptors / Indicators: Discomfort Pain Intervention(s): Monitored during session    Home Living                          Prior Function            PT Goals (current goals can now be found in the care plan section) Acute Rehab PT Goals Patient Stated Goal: go home PT Goal Formulation: With patient/family Time For Goal Achievement: 01/02/24 Potential to Achieve Goals: Good Progress towards PT goals: Progressing toward goals     Frequency    Min 2X/week      PT Plan      Co-evaluation              AM-PAC PT 6 Clicks Mobility   Outcome Measure  Help needed turning from your back to your side while in a flat bed without using bedrails?: None Help needed moving from lying on your back to sitting on the side of a flat bed without using bedrails?: A Little Help needed moving to and from a bed to a chair (including a wheelchair)?: A Little Help needed standing up from a chair using your arms (e.g., wheelchair or bedside chair)?: A Little Help needed to walk in hospital room?: A Little Help needed climbing 3-5 steps with a railing? : A Little 6 Click Score: 19    End of Session   Activity Tolerance: Patient tolerated treatment well Patient left: in bed;with call bell/phone within reach;with family/visitor present Nurse Communication: Mobility status PT Visit Diagnosis: Unsteadiness on feet (R26.81);Muscle weakness (generalized) (M62.81);Difficulty in walking, not elsewhere classified (R26.2)     Time: 4098-1191 PT Time Calculation (min) (ACUTE ONLY): 11 min  Charges:    $Therapeutic Activity: 8-22 mins PT General Charges $$ ACUTE PT VISIT: 1 Visit                     Emaline Handsome, PT, DPT 12/21/23, 11:40 AM   Venetta Gill 12/21/2023, 11:38 AM

## 2023-12-21 NOTE — Plan of Care (Signed)

## 2023-12-21 NOTE — Care Management Important Message (Signed)
 Important Message  Patient Details  Name: John Keith MRN: [id number] Date of Birth: Aug 12, 1950   Important Message Given:  Yes - Medicare IM     Wynonia Hedges 12/21/2023, 1:31 PM

## 2023-12-21 NOTE — TOC Progression Note (Addendum)
 Transition of Care [facility]) - Progression Note    Patient Details  Name: John Keith MRN: [id number] Date of Birth: [birth year]  [facility] CM/SW Contact  Juliane Och, LCSW Phone Number: 12/21/2023, 10:00 AM  Clinical Narrative:     10:01 AM Patient's insurance authorization for SNF was approved. Patient's insurance authorization for ambulance transportation remains pending. SNF informed CSW that patient will have a bed available tomorrow for discharge then. CSW relayed information to patient's son, John Keith, and daughter, John Keith. Sammy and John Keith confirmed that they could provide transportation at discharge if needed. CSW informed medical team of expected discharge tomorrow due to SNF bed availability.  Expected Discharge Plan: Skilled Nursing Facility Barriers to Discharge: Other (must enter comment) (SNF pending bed availability)  Expected Discharge Plan and Services In-house Referral: Clinical Social Work   Post Acute Care Choice: Skilled Nursing Facility Living arrangements for the past 2 months: Single Family Home                                       Social Determinants of Health (SDOH) Interventions SDOH Screenings   Food Insecurity: No Food Insecurity (12/18/2023)  Housing: Low Risk  (12/18/2023)  Transportation Needs: No Transportation Needs (12/18/2023)  Utilities: Not At Risk (12/18/2023)  Alcohol Screen: Low Risk  (11/22/2022)  Depression (PHQ2-9): Low Risk  (08/29/2023)  Financial Resource Strain: Low Risk  (11/22/2022)  Physical Activity: Sufficiently Active (11/22/2022)  Social Connections: Patient Declined (12/18/2023)  Stress: No Stress Concern Present (11/22/2022)  Tobacco Use: Medium Risk (12/18/2023)    Readmission Risk Interventions    12/19/2023    2:27 PM  Readmission Risk Prevention Plan  Post Dischage Appt Complete  Medication Screening Complete  Transportation Screening Complete

## 2023-12-21 NOTE — Progress Notes (Signed)

## 2023-12-21 NOTE — Progress Notes (Signed)
 PROGRESS NOTE    John Keith  ZOX:[id number] DOB: October 23, 1950 DOA: 12/18/2023 PCP: Valli Gaw, MD    Brief Narrative:   73 y.o. male with medical history significant of Aflutter, HTN, and DDD p/w acute encephalopathy.  Earlier in the week started having back pain therefore went to urgent care where he was prescribed tramadol  and Robaxin which she has been taking daily.  During this time became increasingly confused, upon admission CT head negative.  Diagnosed with UTI, started on Rocephin .  Mentation slowly started improving.  Assessment & Plan:  Principal Problem:   Acute encephalopathy   Acute toxic encephalopathy - Suspect from UTI and medication related.  This is significantly improved.  Most of the metabolic workup is negative.  Low TSH, free T4 borderline elevated.  Recheck in 3-4 weeks outpatient  Urinary tract infection - Empiric IV Rocephin .  No cultures available.  But due to persistent fever, will order CT abdomen pelvis  Mild transaminitis - Right upper quadrant ultrasound is negative.  Follow-up outpatient  Atrial fibrillation/flutter - Continue Coreg , Eliquis   Essential hypertension -Cortrak, IV as needed  DVT prophylaxis: apixaban  (ELIQUIS ) tablet 5 mg      Code Status: Full Code Family Communication: Spouse at bedside Ongoing treatment for UTI.  Hopefully discharge tomorrow   Subjective: Overnight having recurrent fevers No other complaints this morning  Examination:  General exam: Appears calm and comfortable  Respiratory system: Clear to auscultation. Respiratory effort normal. Cardiovascular system: S1 & S2 heard, RRR. No JVD, murmurs, rubs, gallops or clicks. No pedal edema. Gastrointestinal system: Abdomen is nondistended, soft and nontender. No organomegaly or masses felt. Normal bowel sounds heard. Central nervous system: Alert and oriented to name and place Extremities: Symmetric 5 x 5 power. Skin: No rashes, lesions or ulcers Psychiatry:  Judgement and insight appear normal. Mood & affect appropriate.                Diet Orders (From admission, onward)     Start     Ordered   12/18/23 1534  Diet regular Room service appropriate? Yes; Fluid consistency: Thin  Diet effective now       Question Answer Comment  Room service appropriate? Yes   Fluid consistency: Thin      12/18/23 1533            Objective: Vitals:   12/20/23 1607 12/20/23 1944 12/21/23 0439 12/21/23 0717  BP: 115/69 110/73 (!) 151/89 (!) 112/57  Pulse: 68 65 65 67  Resp: 18 17 16 16   Temp: 99.2 F (37.3 C) 99.4 F (37.4 C) 98.8 F (37.1 C) (!) 101.2 F (38.4 C)  TempSrc: Oral Oral Oral Oral  SpO2: 92% 95% 97% 97%  Weight:      Height:        Intake/Output Summary (Last 24 hours) at 12/21/2023 1148 Last data filed at 12/21/2023 0300 Gross per 24 hour  Intake 1307.47 ml  Output 700 ml  Net 607.47 ml   Filed Weights   12/18/23 1119 12/18/23 1622  Weight: 105.2 kg 104.6 kg    Scheduled Meds:  apixaban   5 mg Oral BID   carvedilol   6.25 mg Oral BID WC   feeding supplement  1 Container Oral TID BM   pantoprazole   40 mg Oral Daily   Continuous Infusions:  cefTRIAXone  (ROCEPHIN )  IV 2 g (12/20/23 1246)    Nutritional status     Body mass index is 24.71 kg/m.  Data Reviewed:   CBC: Recent  Labs  Lab 12/18/23 1128 12/18/23 1555 12/19/23 0523 12/20/23 0550 12/21/23 0517  WBC 10.5 10.6* 10.7* 9.2 7.9  HGB 12.2* 12.2* 12.2* 11.9* 11.5*  HCT 37.5* 37.2* 37.7* 36.4* 35.6*  MCV 99.2 98.9 98.7 96.3 97.3  PLT 281 295 275 290 289   Basic Metabolic Panel: Recent Labs  Lab 12/18/23 1128 12/18/23 1555 12/19/23 0523 12/20/23 0550 12/21/23 0517  NA 133*  --  135 135 134*  K 4.1  --  3.7 3.5 3.4*  CL 98  --  98 98 101  CO2 23  --  25 26 24   GLUCOSE 118*  --  122* 120* 109*  BUN 17  --  23 30* 21  CREATININE 1.35* 1.27* 1.50* 1.67* 1.24  CALCIUM  9.2  --  9.0 8.7* 8.3*  MG  --   --   --  2.5* 2.4    GFR: Estimated Creatinine Clearance: 72.7 mL/min (by C-G formula based on SCr of 1.24 mg/dL). Liver Function Tests: Recent Labs  Lab 12/18/23 1128  AST 83*  ALT 67*  ALKPHOS 60  BILITOT 1.1  PROT 7.8  ALBUMIN 3.4*   No results for input(s): LIPASE, AMYLASE in the last 168 hours. Recent Labs  Lab 12/19/23 1121  AMMONIA <13   Coagulation Profile: No results for input(s): INR, PROTIME in the last 168 hours. Cardiac Enzymes: No results for input(s): CKTOTAL, CKMB, CKMBINDEX, TROPONINI in the last 168 hours. BNP (last 3 results) No results for input(s): PROBNP in the last 8760 hours. HbA1C: No results for input(s): HGBA1C in the last 72 hours. CBG: Recent Labs  Lab 12/18/23 1121  GLUCAP 129*   Lipid Profile: No results for input(s): CHOL, HDL, LDLCALC, TRIG, CHOLHDL, LDLDIRECT in the last 72 hours. Thyroid  Function Tests: Recent Labs    12/19/23 0523 12/19/23 0849  TSH 0.274*  --   FREET4  --  1.31*   Anemia Panel: Recent Labs    12/19/23 0523  VITAMINB12 1,251*  FOLATE 30.4   Sepsis Labs: No results for input(s): PROCALCITON, LATICACIDVEN in the last 168 hours.  Recent Results (from the past 240 hours)  Resp panel by RT-PCR (RSV, Flu A&B, Covid) Anterior Nasal Swab     Status: None   Collection Time: 12/18/23 11:37 AM   Specimen: Anterior Nasal Swab  Result Value Ref Range Status   SARS Coronavirus 2 by RT PCR NEGATIVE NEGATIVE Final   Influenza A by PCR NEGATIVE NEGATIVE Final   Influenza B by PCR NEGATIVE NEGATIVE Final    Comment: (NOTE) The Xpert Xpress SARS-CoV-2/FLU/RSV plus assay is intended as an aid in the diagnosis of influenza from Nasopharyngeal swab specimens and should not be used as a sole basis for treatment. Nasal washings and aspirates are unacceptable for Xpert Xpress SARS-CoV-2/FLU/RSV testing.  Fact Sheet for Patients: BloggerCourse.com  Fact Sheet for Healthcare  Providers: SeriousBroker.it  This test is not yet approved or cleared by the [place]  FDA and has been authorized for detection and/or diagnosis of SARS-CoV-2 by FDA under an Emergency Use Authorization (EUA). This EUA will remain in effect (meaning this test can be used) for the duration of the COVID-19 declaration under Section 564(b)(1) of the Act, 21 U.S.C. section 360bbb-3(b)(1), unless the authorization is terminated or revoked.     Resp Syncytial Virus by PCR NEGATIVE NEGATIVE Final    Comment: (NOTE) Fact Sheet for Patients: BloggerCourse.com  Fact Sheet for Healthcare Providers: SeriousBroker.it  This test is not yet approved or cleared by the  [place]  FDA and has been authorized for detection and/or diagnosis of SARS-CoV-2 by FDA under an Emergency Use Authorization (EUA). This EUA will remain in effect (meaning this test can be used) for the duration of the COVID-19 declaration under Section 564(b)(1) of the Act, 21 U.S.C. section 360bbb-3(b)(1), unless the authorization is terminated or revoked.  Performed at [facility], [address], [place], [place] [address]          Radiology Studies: No results found.         LOS: 3 days   Time spent= 35 mins    Maggie Schooner, MD [facility]  If 7PM-7AM, please contact night-coverage  12/21/2023, 11:48 AM

## 2023-12-21 NOTE — Progress Notes (Signed)
 Mobility Specialist Progress Note:    12/21/23 0800  Mobility  Activity Transferred from bed to chair  Level of Assistance Minimal assist, patient does 75% or more  Assistive Device Front wheel walker  Distance Ambulated (ft) 5 ft  Activity Response Tolerated well  Mobility Referral Yes  Mobility visit 1 Mobility  Mobility Specialist Start Time (ACUTE ONLY) [id number]  Mobility Specialist Stop Time (ACUTE ONLY) [id number]  Mobility Specialist Time Calculation (min) (ACUTE ONLY) 7 min   Pt received in bed and agreeable. Able to come EOB w/ mod I. Required minA to stand and step to chair. No complaints throughout. Pt left in chair with call bell and all needs met. Chair alarm on and family present.  D'Vante Nolon Baxter Mobility Specialist Please contact via Special educational needs teacher or Rehab office at [phone]

## 2023-12-22 ENCOUNTER — Other Ambulatory Visit (HOSPITAL_COMMUNITY): Payer: Self-pay

## 2023-12-22 DIAGNOSIS — G9340 Encephalopathy, unspecified: Secondary | ICD-10-CM | POA: Diagnosis not present

## 2023-12-22 LAB — CBC
HCT: 31.7 % — ABNORMAL LOW (ref 39.0–52.0)
Hemoglobin: 10.1 g/dL — ABNORMAL LOW (ref 13.0–17.0)
MCH: 31.2 pg (ref 26.0–34.0)
MCHC: 31.9 g/dL (ref 30.0–36.0)
MCV: 97.8 fL (ref 80.0–100.0)
Platelets: 228 10*3/uL (ref 150–400)
RBC: 3.24 MIL/uL — ABNORMAL LOW (ref 4.22–5.81)
RDW: 13.9 % (ref 11.5–15.5)
WBC: 7.6 10*3/uL (ref 4.0–10.5)
nRBC: 0 % (ref 0.0–0.2)

## 2023-12-22 LAB — BASIC METABOLIC PANEL WITH GFR
Anion gap: 5 (ref 5–15)
BUN: 16 mg/dL (ref 8–23)
CO2: 25 mmol/L (ref 22–32)
Calcium: 7.9 mg/dL — ABNORMAL LOW (ref 8.9–10.3)
Chloride: 103 mmol/L (ref 98–111)
Creatinine, Ser: 1.15 mg/dL (ref 0.61–1.24)
GFR, Estimated: >60 mL/min (ref >60)
Glucose, Bld: 112 mg/dL — ABNORMAL HIGH (ref 70–99)
Potassium: 3.7 mmol/L (ref 3.5–5.1)
Sodium: 133 mmol/L — ABNORMAL LOW (ref 135–145)

## 2023-12-22 LAB — URINE CULTURE: Culture: 10000 — AB

## 2023-12-22 LAB — MAGNESIUM: Magnesium: 2.3 mg/dL (ref 1.7–2.4)

## 2023-12-22 MED ORDER — CEPHALEXIN 250 MG PO CAPS
750.0000 mg | ORAL_CAPSULE | Freq: Three times a day (TID) | ORAL | 0 refills | Status: DC
  Filled 2023-12-22: qty 45, 5d supply, fill #0

## 2023-12-22 MED ORDER — AZITHROMYCIN 500 MG PO TABS
500.0000 mg | ORAL_TABLET | Freq: Every day | ORAL | 0 refills | Status: AC
  Filled 2023-12-22: qty 3, 3d supply, fill #0

## 2023-12-22 MED ORDER — CEFADROXIL 500 MG PO CAPS
500.0000 mg | ORAL_CAPSULE | Freq: Two times a day (BID) | ORAL | 0 refills | Status: AC
  Filled 2023-12-22: qty 10, 5d supply, fill #0

## 2023-12-22 NOTE — Discharge Summary (Addendum)
 Physician Discharge Summary  John Keith:[id number] DOB: [birth year] DOA: 12/18/2023  PCP: Valli Gaw, MD  Admit date: 12/18/2023 Discharge date: 12/22/2023  Admitted From: Home Disposition: Home  Recommendations for Outpatient Follow-up:  Follow up with PCP in 1-2 weeks Please obtain BMP/CBC in one week your next doctors visit.  P.o. durcef and azithromycin prescribed Outpatient follow-up with PCP   Discharge Condition: Stable CODE STATUS: Full code Diet recommendation: Low-salt  Brief/Interim Summary: Brief Narrative:   73 y.o. male with medical history significant of Aflutter, HTN, and DDD p/w acute encephalopathy.  Earlier in the week started having back pain therefore went to urgent care where he was prescribed tramadol  and Robaxin which she has been taking daily.  During this time became increasingly confused, upon admission CT head negative.  Diagnosed with UTI, started on Rocephin .  Mentation slowly started improving.  Today patient mental status is back to baseline.  Did have low-grade temperature but CT abdomen pelvis is negative for any acute pathology or deeper acute infection. Will add azithromycin and change his Rocephin  to Keflex.  Follow-up outpatient with primary care provider.  Assessment & Plan:  Principal Problem:   Acute encephalopathy   Acute toxic encephalopathy, rule solved - And is back to baseline.  Suspect from UTI and medication related.  This is significantly improved.  Most of the metabolic workup is negative.  Low TSH, free T4 borderline elevated.  Recheck in 3-4 weeks outpatient  Urinary tract infection - Empiric IV Rocephin .  No cultures available.  Due to persistent fever CT abdomen pelvis was performed which did not show any acute pathology.  Did have some atypical appearing opacity in the lungs therefore added azithromycin.  Lungs are overall clear to auscultation bilaterally  Mild transaminitis - Right upper quadrant ultrasound is  negative.  Follow-up outpatient  Atrial fibrillation/flutter - Continue Coreg , Eliquis   Essential hypertension Resume home meds  DVT prophylaxis: apixaban  (ELIQUIS ) tablet 5 mg      Code Status: Full Code Family Communication: Spouse at bedside Discharge   Subjective: Feeling great mentation is back to baseline.  Alert awake oriented.  Had low-grade temperature but wishing to go home today.  He will have outpatient follow-up  Examination:  General exam: Appears calm and comfortable  Respiratory system: Clear to auscultation. Respiratory effort normal. Cardiovascular system: S1 & S2 heard, RRR. No JVD, murmurs, rubs, gallops or clicks. No pedal edema. Gastrointestinal system: Abdomen is nondistended, soft and nontender. No organomegaly or masses felt. Normal bowel sounds heard. Central nervous system: Alert and oriented to name and place Extremities: Symmetric 5 x 5 power. Skin: No rashes, lesions or ulcers Psychiatry: Judgement and insight appear normal. Mood & affect appropriate.    Discharge Diagnoses:  Principal Problem:   Acute encephalopathy      Discharge Exam: Vitals:   12/22/23 0500 12/22/23 0731  BP: (!) 109/57 116/73  Pulse: 60 63  Resp: 18 18  Temp: 98.5 F (36.9 C) 98.2 F (36.8 C)  SpO2: 97% 96%   Vitals:   12/21/23 1559 12/22/23 0127 12/22/23 0500 12/22/23 0731  BP: 111/62 98/62 (!) 109/57 116/73  Pulse: 69 68 60 63  Resp: 18 20 18 18   Temp: (!) 100.4 F (38 C) (!) 100.6 F (38.1 C) 98.5 F (36.9 C) 98.2 F (36.8 C)  TempSrc: Oral Oral Oral   SpO2: 98% 93% 97% 96%  Weight:      Height:          Discharge Instructions  Allergies as of 12/22/2023       Reactions   Crestor  [rosuvastatin  Calcium ] Other (See Comments)   Elevated muscle enzymes   Lipitor [atorvastatin  Calcium ] Other (See Comments)   Elevated muscle enzymes        Medication List     TAKE these medications    apixaban  5 MG Tabs tablet Commonly known as:  Eliquis  Take 1 tablet (5 mg total) by mouth 2 (two) times daily.   azithromycin 500 MG tablet Commonly known as: Zithromax Take 1 tablet (500 mg total) by mouth daily for 3 days.   calcium  carbonate 1250 (500 Ca) MG tablet Commonly known as: OS-CAL - dosed in mg of elemental calcium  Take 1 tablet (500 mg of elemental calcium  total) by mouth daily with breakfast.   carvedilol  6.25 MG tablet Commonly known as: COREG  Take 1 tablet (6.25 mg total) by mouth 2 (two) times daily with a meal.   cefadroxil 500 MG capsule Commonly known as: DURICEF Take 1 capsule (500 mg total) by mouth 2 (two) times daily for 5 days.   Entresto  24-26 MG Generic drug: sacubitril -valsartan  Take 1 tablet by mouth 2 (two) times daily.   ezetimibe  10 MG tablet Commonly known as: Zetia  Take 1 tablet (10 mg total) by mouth daily.   Farxiga  10 MG Tabs tablet Generic drug: dapagliflozin  propanediol Take 1 tablet (10 mg total) by mouth daily.   ferrous sulfate  325 (65 FE) MG tablet Take 325 mg by mouth daily with breakfast.   folic acid  1 MG tablet Commonly known as: FOLVITE  Take 1 tablet (1 mg total) by mouth daily.   gabapentin  300 MG capsule Commonly known as: NEURONTIN  TAKE 3 CAPSULES(900 MG) BY MOUTH DAILY   methotrexate  2.5 MG tablet Commonly known as: RHEUMATREX TAKE 20MG (8 TABLETS) EVERY WEDNESDAY. PROTECT FROM LIGHT AS DIRECTED   multivitamin tablet Take 1 tablet by mouth daily.   mupirocin  ointment 2 % Commonly known as: BACTROBAN  Apply 1 Application topically daily.   pantoprazole  40 MG tablet Commonly known as: Protonix  Take 1 tablet (40 mg total) by mouth daily. 30 min before food   spironolactone  25 MG tablet Commonly known as: ALDACTONE  Take 1 tablet (25 mg total) by mouth daily.   Vitamin D -3 125 MCG (5000 UT) Tabs Take 1 tablet by mouth daily.               Durable Medical Equipment  (From admission, onward)           Start     Ordered   12/22/23 1031  For  home use only DME Walker rolling  Once       Comments: Tall, patient is 6'9.  Question Answer Comment  Walker: With 5 Inch Wheels   Patient needs a walker to treat with the following condition Gait instability      12/22/23 1032            Follow-up Information     Valli Gaw, MD Follow up in 1 week(s).   Specialty: Family Medicine Contact information: [address] [place] [address] [phone]         [facility] Follow up.   Specialty: Home Health Services Why: Someone will call you to schedule first home visit. Contact information: [address] [place] [address] [phone]                Allergies  Allergen Reactions   Crestor  [Rosuvastatin  Calcium ] Other (See Comments)    Elevated muscle  enzymes    Lipitor [Atorvastatin  Calcium ] Other (See Comments)    Elevated muscle enzymes     You were cared for by a hospitalist during your hospital stay. If you have any questions about your discharge medications or the care you received while you were in the hospital after you are discharged, you can call the unit and asked to speak with the hospitalist on call if the hospitalist that took care of you is not available. Once you are discharged, your primary care physician will handle any further medical issues. Please note that no refills for any discharge medications will be authorized once you are discharged, as it is imperative that you return to your primary care physician (or establish a relationship with a primary care physician if you do not have one) for your aftercare needs so that they can reassess your need for medications and monitor your lab values.  You were cared for by a hospitalist during your hospital stay. If you have any questions about your discharge medications or the care you received while you were in the hospital after you are discharged, you can call the unit and asked to speak with the hospitalist on call if the  hospitalist that took care of you is not available. Once you are discharged, your primary care physician will handle any further medical issues. Please note that NO REFILLS for any discharge medications will be authorized once you are discharged, as it is imperative that you return to your primary care physician (or establish a relationship with a primary care physician if you do not have one) for your aftercare needs so that they can reassess your need for medications and monitor your lab values.  Please request your Prim.MD to go over all Hospital Tests and Procedure/Radiological results at the follow up, please get all Hospital records sent to your Prim MD by signing hospital release before you go home.  Get CBC, CMP, 2 view Chest X ray checked  by Primary MD during your next visit or SNF MD in 5-7 days ( we routinely change or add medications that can affect your baseline labs and fluid status, therefore we recommend that you get the mentioned basic workup next visit with your PCP, your PCP may decide not to get them or add new tests based on their clinical decision)  On your next visit with your primary care physician please Get Medicines reviewed and adjusted.  If you experience worsening of your admission symptoms, develop shortness of breath, life threatening emergency, suicidal or homicidal thoughts you must seek medical attention immediately by calling 911 or calling your MD immediately  if symptoms less severe.  You Must read complete instructions/literature along with all the possible adverse reactions/side effects for all the Medicines you take and that have been prescribed to you. Take any new Medicines after you have completely understood and accpet all the possible adverse reactions/side effects.   Do not drive, operate heavy machinery, perform activities at heights, swimming or participation in water activities or provide baby sitting services if your were admitted for syncope or  siezures until you have seen by Primary MD or a Neurologist and advised to do so again.  Do not drive when taking Pain medications.   Procedures/Studies: CT ABDOMEN PELVIS W CONTRAST Result Date: 12/21/2023 CLINICAL DATA:  Sepsis. EXAM: CT ABDOMEN AND PELVIS WITH CONTRAST TECHNIQUE: Multidetector CT imaging of the abdomen and pelvis was performed using the standard protocol following bolus administration of intravenous contrast. RADIATION DOSE  REDUCTION: This exam was performed according to the departmental dose-optimization program which includes automated exposure control, adjustment of the mA and/or kV according to patient size and/or use of iterative reconstruction technique. CONTRAST:  75mL OMNIPAQUE  IOHEXOL  350 MG/ML SOLN COMPARISON:  CT abdomen/pelvis dated 10/10/2019. FINDINGS: Lower chest: Patchy ground-glass and tree-in-bud nodular opacities in the left lower lobe, may be infectious/inflammatory. Partially visualized cardiac leads. Hepatobiliary: 1.2 cm intermediate density lesion in the right hepatic lobe, previously measuring 2 cm and simple fluid attenuation. This is nonspecific and favored to represent a benign etiology such as a cyst with proteinaceous debris. Similar subcentimeter focal hypodensities in the left hepatic lobe are too small to definitively characterize. Gallbladder is unremarkable. No biliary dilatation. Pancreas: Unremarkable. No pancreatic ductal dilatation or surrounding inflammatory changes. Spleen: Normal in size without focal abnormality. Adrenals/Urinary Tract: Adrenal glands are unremarkable. Kidneys are normal, without renal calculi, focal lesion, or hydronephrosis. Bladder is unremarkable. Stomach/Bowel: Stomach is within normal limits. Status post appendectomy. No evidence of obstruction or focal inflammatory changes. Extensive colonic diverticulosis, most pronounced in the descending and sigmoid colon without evidence of acute diverticulitis. Vascular/Lymphatic:  Abdominal aorta is normal in caliber with atherosclerotic calcification. No enlarged abdominal or pelvic lymph nodes. Reproductive: Prostate is enlarged measuring up to 5.9 cm in diameter. Other: No abdominal wall hernia or abnormality. No abdominopelvic ascites. No free air. Musculoskeletal: No acute osseous abnormality. No suspicious osseous lesion. Multilevel degenerative changes of the lumbar spine with bulky anterior bridging osteophytosis of the mid to lower lumbar spine. Mild-to-moderate osteoarthritis of the bilateral hips. IMPRESSION: 1. No acute localizing findings in the abdomen or pelvis. 2. Extensive colonic diverticulosis, most pronounced in the descending and sigmoid colon, without evidence of acute diverticulitis. 3. Prostatomegaly. 4. 1.2 cm intermediate density lesion in the right hepatic lobe, previously measuring 2 cm and simple fluid attenuation. This is nonspecific and favored to represent a benign etiology such as a cyst with proteinaceous debris. 5. Patchy ground-glass and tree-in-bud nodular opacities in the left lower lobe may be infectious/inflammatory. 6.  Aortic Atherosclerosis (ICD10-I70.0). Electronically Signed   By: Mannie Seek M.D.   On: 12/21/2023 13:48   MR BRAIN WO CONTRAST Result Date: 12/19/2023 CLINICAL DATA:  73 year old male with altered mental status. Weakness. EXAM: MRI HEAD WITHOUT CONTRAST TECHNIQUE: Multiplanar, multiecho pulse sequences of the brain and surrounding structures were obtained without intravenous contrast. COMPARISON:  Head CT yesterday. FINDINGS: Brain: No restricted diffusion to suggest acute infarction. No midline shift, mass effect, evidence of mass lesion, ventriculomegaly, extra-axial collection or acute intracranial hemorrhage. Cervicomedullary junction and pituitary are within normal limits. Cerebral volume is within normal limits for age. Scattered nonspecific rep at scattered cerebral white matter T2 and FLAIR hyperintensity, including in  some of the deep white matter capsules greater on the left. Extent is mild to moderate for age. No cortical encephalomalacia identified. Small chronic microhemorrhage in the ventral right thalamus. Mild to moderate deep gray nuclei T2 heterogeneity otherwise. Brainstem and cerebellum appear negative for age. Vascular: Major intracranial vascular flow voids are preserved. Skull and upper cervical spine: Partially visible cervical spine disc and endplate degeneration. Background bone marrow signal is within normal limits. Sinuses/Orbits: Negative. Other: Mastoids are clear. Visible internal auditory structures appear normal. Negative visible scalp and face. IMPRESSION: 1. No acute intracranial abnormality. 2. Up to moderate for age signal changes in the cerebral white matter and deep gray nuclei most compatible with chronic small vessel disease. Electronically Signed   By: Marlise Simpers  M.D.   On: 12/19/2023 09:58   US  Abdomen Limited RUQ (LIVER/GB) Result Date: 12/18/2023 CLINICAL DATA:  [id number] Transaminitis [id number] EXAM: ULTRASOUND ABDOMEN LIMITED COMPARISON:  03/28/2017. FINDINGS: Hepatic cyst measures 1.8 cm. no additional hepatic lesions or biliary ductal dilatation. Hepatopetal portal vein flow. Gallbladder is contracted. No stones or pericholecystic fluid. CBD measured 0.5cm. IMPRESSION: Hepatic cyst. Otherwise unremarkable examination. Electronically Signed   By: Sydell Eva M.D.   On: 12/18/2023 18:56   CT Head Wo Contrast Result Date: 12/18/2023 CLINICAL DATA:  Mental status change, unknown cause EXAM: CT HEAD WITHOUT CONTRAST TECHNIQUE: Contiguous axial images were obtained from the base of the skull through the vertex without intravenous contrast. RADIATION DOSE REDUCTION: This exam was performed according to the departmental dose-optimization program which includes automated exposure control, adjustment of the mA and/or kV according to patient size and/or use of iterative reconstruction technique.  COMPARISON:  None Available. FINDINGS: Brain: There is periventricular white matter decreased attenuation consistent with small vessel ischemic changes. Ventricles, sulci and cisterns are prominent consistent with age related involutional changes. No acute intracranial hemorrhage, mass effect or shift. No hydrocephalus. Vascular: No hyperdense vessel or unexpected calcification. Skull: Normal. Negative for fracture or focal lesion. Sinuses/Orbits: No acute finding. IMPRESSION: Atrophy and chronic small vessel ischemic changes. No acute intracranial process identified. Electronically Signed   By: Sydell Eva M.D.   On: 12/18/2023 12:50   DG Chest Portable 1 View Result Date: 12/18/2023 CLINICAL DATA:  fever, SOB EXAM: PORTABLE CHEST 1 VIEW COMPARISON:  December 11, 2021 FINDINGS: The cardiomediastinal silhouette is unchanged in contour.LEFT chest AICD. No pleural effusion. No pneumothorax. No acute pleuroparenchymal abnormality. IMPRESSION: No acute cardiopulmonary abnormality. Electronically Signed   By: Clancy Crimes M.D.   On: 12/18/2023 12:19   CUP PACEART REMOTE DEVICE CHECK Result Date: 12/10/2023 ICD Scheduled remote reviewed. Normal device function.  Presenting rhythm: VS, consistent with SB.  HF diagnostics have been normal in this monitoring period. Next remote 91 days. - CS, CVRS    The results of significant diagnostics from this hospitalization (including imaging, microbiology, ancillary and laboratory) are listed below for reference.     Microbiology: Recent Results (from the past 240 hours)  Resp panel by RT-PCR (RSV, Flu A&B, Covid) Anterior Nasal Swab     Status: None   Collection Time: 12/18/23 11:37 AM   Specimen: Anterior Nasal Swab  Result Value Ref Range Status   SARS Coronavirus 2 by RT PCR NEGATIVE NEGATIVE Final   Influenza A by PCR NEGATIVE NEGATIVE Final   Influenza B by PCR NEGATIVE NEGATIVE Final    Comment: (NOTE) The Xpert Xpress SARS-CoV-2/FLU/RSV plus assay  is intended as an aid in the diagnosis of influenza from Nasopharyngeal swab specimens and should not be used as a sole basis for treatment. Nasal washings and aspirates are unacceptable for Xpert Xpress SARS-CoV-2/FLU/RSV testing.  Fact Sheet for Patients: BloggerCourse.com  Fact Sheet for Healthcare Providers: SeriousBroker.it  This test is not yet approved or cleared by the [place]  FDA and has been authorized for detection and/or diagnosis of SARS-CoV-2 by FDA under an Emergency Use Authorization (EUA). This EUA will remain in effect (meaning this test can be used) for the duration of the COVID-19 declaration under Section 564(b)(1) of the Act, 21 U.S.C. section 360bbb-3(b)(1), unless the authorization is terminated or revoked.     Resp Syncytial Virus by PCR NEGATIVE NEGATIVE Final    Comment: (NOTE) Fact Sheet for Patients: BloggerCourse.com  Fact Sheet for Healthcare  Providers: SeriousBroker.it  This test is not yet approved or cleared by the [place]  FDA and has been authorized for detection and/or diagnosis of SARS-CoV-2 by FDA under an Emergency Use Authorization (EUA). This EUA will remain in effect (meaning this test can be used) for the duration of the COVID-19 declaration under Section 564(b)(1) of the Act, 21 U.S.C. section 360bbb-3(b)(1), unless the authorization is terminated or revoked.  Performed at [facility], [address] [place], [place] [address]   Urine Culture (for pregnant, neutropenic or urologic patients or patients with an indwelling urinary catheter)     Status: Abnormal   Collection Time: 12/21/23  8:35 AM   Specimen: Urine, Clean Catch  Result Value Ref Range Status   Specimen Description URINE, CLEAN CATCH  Final   Special Requests   Final    NONE Performed at [facility], [address], [place], [place]  [address]    Culture 10,000 COLONIES/mL YEAST (A)  Final   Report Status 12/22/2023 FINAL  Final     Labs: BNP (last 3 results) Recent Labs    05/30/23 0949 11/10/23 1123 12/18/23 1138  BNP 94.9 24.5 242.4*   Basic Metabolic Panel: Recent Labs  Lab 12/18/23 1128 12/18/23 1555 12/19/23 0523 12/20/23 0550 12/21/23 0517 12/22/23 0429  NA 133*  --  135 135 134* 133*  K 4.1  --  3.7 3.5 3.4* 3.7  CL 98  --  98 98 101 103  CO2 23  --  25 26 24 25   GLUCOSE 118*  --  122* 120* 109* 112*  BUN 17  --  23 30* 21 16  CREATININE 1.35* 1.27* 1.50* 1.67* 1.24 1.15  CALCIUM  9.2  --  9.0 8.7* 8.3* 7.9*  MG  --   --   --  2.5* 2.4 2.3   Liver Function Tests: Recent Labs  Lab 12/18/23 1128  AST 83*  ALT 67*  ALKPHOS 60  BILITOT 1.1  PROT 7.8  ALBUMIN 3.4*   No results for input(s): LIPASE, AMYLASE in the last 168 hours. Recent Labs  Lab 12/19/23 1121  AMMONIA <13   CBC: Recent Labs  Lab 12/18/23 1555 12/19/23 0523 12/20/23 0550 12/21/23 0517 12/22/23 0429  WBC 10.6* 10.7* 9.2 7.9 7.6  HGB 12.2* 12.2* 11.9* 11.5* 10.1*  HCT 37.2* 37.7* 36.4* 35.6* 31.7*  MCV 98.9 98.7 96.3 97.3 97.8  PLT 295 275 290 289 228   Cardiac Enzymes: No results for input(s): CKTOTAL, CKMB, CKMBINDEX, TROPONINI in the last 168 hours. BNP: Invalid input(s): POCBNP CBG: Recent Labs  Lab 12/18/23 1121  GLUCAP 129*   D-Dimer No results for input(s): DDIMER in the last 72 hours. Hgb A1c No results for input(s): HGBA1C in the last 72 hours. Lipid Profile No results for input(s): CHOL, HDL, LDLCALC, TRIG, CHOLHDL, LDLDIRECT in the last 72 hours. Thyroid  function studies No results for input(s): TSH, T4TOTAL, T3FREE, THYROIDAB in the last 72 hours.  Invalid input(s): FREET3 Anemia work up No results for input(s): VITAMINB12, FOLATE, FERRITIN, TIBC, IRON, RETICCTPCT in the last 72 hours. Urinalysis    Component Value Date/Time    COLORURINE YELLOW 12/18/2023 1144   APPEARANCEUR HAZY (A) 12/18/2023 1144   APPEARANCEUR Clear 08/15/2017 0933   LABSPEC 1.032 (H) 12/18/2023 1144   PHURINE 5.0 12/18/2023 1144   GLUCOSEU >=500 (A) 12/18/2023 1144   HGBUR MODERATE (A) 12/18/2023 1144   BILIRUBINUR NEGATIVE 12/18/2023 1144   BILIRUBINUR Negative 01/20/2021 1532   BILIRUBINUR  Negative 08/15/2017 0933   KETONESUR 5 (A) 12/18/2023 1144   PROTEINUR 30 (A) 12/18/2023 1144   UROBILINOGEN 0.2 01/20/2021 1532   NITRITE NEGATIVE 12/18/2023 1144   LEUKOCYTESUR SMALL (A) 12/18/2023 1144   Sepsis Labs Recent Labs  Lab 12/19/23 0523 12/20/23 0550 12/21/23 0517 12/22/23 0429  WBC 10.7* 9.2 7.9 7.6   Microbiology Recent Results (from the past 240 hours)  Resp panel by RT-PCR (RSV, Flu A&B, Covid) Anterior Nasal Swab     Status: None   Collection Time: 12/18/23 11:37 AM   Specimen: Anterior Nasal Swab  Result Value Ref Range Status   SARS Coronavirus 2 by RT PCR NEGATIVE NEGATIVE Final   Influenza A by PCR NEGATIVE NEGATIVE Final   Influenza B by PCR NEGATIVE NEGATIVE Final    Comment: (NOTE) The Xpert Xpress SARS-CoV-2/FLU/RSV plus assay is intended as an aid in the diagnosis of influenza from Nasopharyngeal swab specimens and should not be used as a sole basis for treatment. Nasal washings and aspirates are unacceptable for Xpert Xpress SARS-CoV-2/FLU/RSV testing.  Fact Sheet for Patients: BloggerCourse.com  Fact Sheet for Healthcare Providers: SeriousBroker.it  This test is not yet approved or cleared by the [place]  FDA and has been authorized for detection and/or diagnosis of SARS-CoV-2 by FDA under an Emergency Use Authorization (EUA). This EUA will remain in effect (meaning this test can be used) for the duration of the COVID-19 declaration under Section 564(b)(1) of the Act, 21 U.S.C. section 360bbb-3(b)(1), unless the authorization is terminated  or revoked.     Resp Syncytial Virus by PCR NEGATIVE NEGATIVE Final    Comment: (NOTE) Fact Sheet for Patients: BloggerCourse.com  Fact Sheet for Healthcare Providers: SeriousBroker.it  This test is not yet approved or cleared by the [place]  FDA and has been authorized for detection and/or diagnosis of SARS-CoV-2 by FDA under an Emergency Use Authorization (EUA). This EUA will remain in effect (meaning this test can be used) for the duration of the COVID-19 declaration under Section 564(b)(1) of the Act, 21 U.S.C. section 360bbb-3(b)(1), unless the authorization is terminated or revoked.  Performed at [facility], [address] [place], [place] [address]   Urine Culture (for pregnant, neutropenic or urologic patients or patients with an indwelling urinary catheter)     Status: Abnormal   Collection Time: 12/21/23  8:35 AM   Specimen: Urine, Clean Catch  Result Value Ref Range Status   Specimen Description URINE, CLEAN CATCH  Final   Special Requests   Final    NONE Performed at [facility], [address], [place], [place] [address]    Culture 10,000 COLONIES/mL YEAST (A)  Final   Report Status 12/22/2023 FINAL  Final     Time coordinating discharge:  I have spent 35 minutes face to face with the patient and on the ward discussing the patients care, assessment, plan and disposition with other care givers. >50% of the time was devoted counseling the patient about the risks and benefits of treatment/Discharge disposition and coordinating care.   SIGNED:   Maggie Schooner, MD  [facility] 12/22/2023, 11:09 AM   If 7PM-7AM, please contact night-coverage

## 2023-12-22 NOTE — Progress Notes (Signed)
 PROGRESS NOTE    John Keith  RUE:[id number] DOB: May 01, 1951 DOA: 12/18/2023 PCP: Valli Gaw, MD    Brief Narrative:   73 y.o. male with medical history significant of Aflutter, HTN, and DDD p/w acute encephalopathy.  Earlier in the week started having back pain therefore went to urgent care where he was prescribed tramadol  and Robaxin which she has been taking daily.  During this time became increasingly confused, upon admission CT head negative.  Diagnosed with UTI, started on Rocephin .  Mentation slowly started improving.  Today patient mental status is back to baseline.  Did have low-grade temperature but CT abdomen pelvis is negative for any acute pathology or deeper acute infection. Will add azithromycin and change his Rocephin  to Keflex.  Follow-up outpatient with primary care provider.  Assessment & Plan:  Principal Problem:   Acute encephalopathy   Acute toxic encephalopathy, rule solved - And is back to baseline.  Suspect from UTI and medication related.  This is significantly improved.  Most of the metabolic workup is negative.  Low TSH, free T4 borderline elevated.  Recheck in 3-4 weeks outpatient  Urinary tract infection - Empiric IV Rocephin .  No cultures available.  Due to persistent fever CT abdomen pelvis was performed which did not show any acute pathology.  Did have some atypical appearing opacity in the lungs therefore added azithromycin.  Lungs are overall clear to auscultation bilaterally  Mild transaminitis - Right upper quadrant ultrasound is negative.  Follow-up outpatient  Atrial fibrillation/flutter - Continue Coreg , Eliquis   Essential hypertension Resume home meds  DVT prophylaxis: apixaban  (ELIQUIS ) tablet 5 mg      Code Status: Full Code Family Communication: Spouse at bedside Discharge   Subjective: Feeling great mentation is back to baseline.  Alert awake oriented.  Had low-grade temperature but wishing to go home today.  He will have  outpatient follow-up  Examination:  General exam: Appears calm and comfortable  Respiratory system: Clear to auscultation. Respiratory effort normal. Cardiovascular system: S1 & S2 heard, RRR. No JVD, murmurs, rubs, gallops or clicks. No pedal edema. Gastrointestinal system: Abdomen is nondistended, soft and nontender. No organomegaly or masses felt. Normal bowel sounds heard. Central nervous system: Alert and oriented to name and place Extremities: Symmetric 5 x 5 power. Skin: No rashes, lesions or ulcers Psychiatry: Judgement and insight appear normal. Mood & affect appropriate.                Diet Orders (From admission, onward)     Start     Ordered   12/18/23 1534  Diet regular Room service appropriate? Yes; Fluid consistency: Thin  Diet effective now       Question Answer Comment  Room service appropriate? Yes   Fluid consistency: Thin      12/18/23 1533            Objective: Vitals:   12/21/23 1559 12/22/23 0127 12/22/23 0500 12/22/23 0731  BP: 111/62 98/62 (!) 109/57 116/73  Pulse: 69 68 60 63  Resp: 18 20 18 18   Temp: (!) 100.4 F (38 C) (!) 100.6 F (38.1 C) 98.5 F (36.9 C) 98.2 F (36.8 C)  TempSrc: Oral Oral Oral   SpO2: 98% 93% 97% 96%  Weight:      Height:        Intake/Output Summary (Last 24 hours) at 12/22/2023 1053 Last data filed at 12/22/2023 1012 Gross per 24 hour  Intake 620 ml  Output 600 ml  Net 20 ml  Filed Weights   12/18/23 1119 12/18/23 1622  Weight: 105.2 kg 104.6 kg    Scheduled Meds:  apixaban   5 mg Oral BID   carvedilol   6.25 mg Oral BID WC   feeding supplement  1 Container Oral TID BM   pantoprazole   40 mg Oral Daily   sodium chloride  flush  10-40 mL Intracatheter Q12H   Continuous Infusions:  cefTRIAXone  (ROCEPHIN )  IV 2 g (12/21/23 1421)    Nutritional status     Body mass index is 24.71 kg/m.  Data Reviewed:   CBC: Recent Labs  Lab 12/18/23 1555 12/19/23 0523 12/20/23 0550 12/21/23 0517  12/22/23 0429  WBC 10.6* 10.7* 9.2 7.9 7.6  HGB 12.2* 12.2* 11.9* 11.5* 10.1*  HCT 37.2* 37.7* 36.4* 35.6* 31.7*  MCV 98.9 98.7 96.3 97.3 97.8  PLT 295 275 290 289 228   Basic Metabolic Panel: Recent Labs  Lab 12/18/23 1128 12/18/23 1555 12/19/23 0523 12/20/23 0550 12/21/23 0517 12/22/23 0429  NA 133*  --  135 135 134* 133*  K 4.1  --  3.7 3.5 3.4* 3.7  CL 98  --  98 98 101 103  CO2 23  --  25 26 24 25   GLUCOSE 118*  --  122* 120* 109* 112*  BUN 17  --  23 30* 21 16  CREATININE 1.35* 1.27* 1.50* 1.67* 1.24 1.15  CALCIUM  9.2  --  9.0 8.7* 8.3* 7.9*  MG  --   --   --  2.5* 2.4 2.3   GFR: Estimated Creatinine Clearance: 78.4 mL/min (by C-G formula based on SCr of 1.15 mg/dL). Liver Function Tests: Recent Labs  Lab 12/18/23 1128  AST 83*  ALT 67*  ALKPHOS 60  BILITOT 1.1  PROT 7.8  ALBUMIN 3.4*   No results for input(s): LIPASE, AMYLASE in the last 168 hours. Recent Labs  Lab 12/19/23 1121  AMMONIA <13   Coagulation Profile: No results for input(s): INR, PROTIME in the last 168 hours. Cardiac Enzymes: No results for input(s): CKTOTAL, CKMB, CKMBINDEX, TROPONINI in the last 168 hours. BNP (last 3 results) No results for input(s): PROBNP in the last 8760 hours. HbA1C: No results for input(s): HGBA1C in the last 72 hours. CBG: Recent Labs  Lab 12/18/23 1121  GLUCAP 129*   Lipid Profile: No results for input(s): CHOL, HDL, LDLCALC, TRIG, CHOLHDL, LDLDIRECT in the last 72 hours. Thyroid  Function Tests: No results for input(s): TSH, T4TOTAL, FREET4, T3FREE, THYROIDAB in the last 72 hours. Anemia Panel: No results for input(s): VITAMINB12, FOLATE, FERRITIN, TIBC, IRON, RETICCTPCT in the last 72 hours. Sepsis Labs: No results for input(s): PROCALCITON, LATICACIDVEN in the last 168 hours.  Recent Results (from the past 240 hours)  Resp panel by RT-PCR (RSV, Flu A&B, Covid) Anterior Nasal Swab      Status: None   Collection Time: 12/18/23 11:37 AM   Specimen: Anterior Nasal Swab  Result Value Ref Range Status   SARS Coronavirus 2 by RT PCR NEGATIVE NEGATIVE Final   Influenza A by PCR NEGATIVE NEGATIVE Final   Influenza B by PCR NEGATIVE NEGATIVE Final    Comment: (NOTE) The Xpert Xpress SARS-CoV-2/FLU/RSV plus assay is intended as an aid in the diagnosis of influenza from Nasopharyngeal swab specimens and should not be used as a sole basis for treatment. Nasal washings and aspirates are unacceptable for Xpert Xpress SARS-CoV-2/FLU/RSV testing.  Fact Sheet for Patients: BloggerCourse.com  Fact Sheet for Healthcare Providers: SeriousBroker.it  This test is not yet approved or  cleared by the [place]  FDA and has been authorized for detection and/or diagnosis of SARS-CoV-2 by FDA under an Emergency Use Authorization (EUA). This EUA will remain in effect (meaning this test can be used) for the duration of the COVID-19 declaration under Section 564(b)(1) of the Act, 21 U.S.C. section 360bbb-3(b)(1), unless the authorization is terminated or revoked.     Resp Syncytial Virus by PCR NEGATIVE NEGATIVE Final    Comment: (NOTE) Fact Sheet for Patients: BloggerCourse.com  Fact Sheet for Healthcare Providers: SeriousBroker.it  This test is not yet approved or cleared by the [place]  FDA and has been authorized for detection and/or diagnosis of SARS-CoV-2 by FDA under an Emergency Use Authorization (EUA). This EUA will remain in effect (meaning this test can be used) for the duration of the COVID-19 declaration under Section 564(b)(1) of the Act, 21 U.S.C. section 360bbb-3(b)(1), unless the authorization is terminated or revoked.  Performed at [facility], [address] [place], [place] [address]   Urine Culture (for pregnant, neutropenic or urologic patients or  patients with an indwelling urinary catheter)     Status: Abnormal   Collection Time: 12/21/23  8:35 AM   Specimen: Urine, Clean Catch  Result Value Ref Range Status   Specimen Description URINE, CLEAN CATCH  Final   Special Requests   Final    NONE Performed at [facility], [address], [place], [place] [address]    Culture 10,000 COLONIES/mL YEAST (A)  Final   Report Status 12/22/2023 FINAL  Final         Radiology Studies: CT ABDOMEN PELVIS W CONTRAST Result Date: 12/21/2023 CLINICAL DATA:  Sepsis. EXAM: CT ABDOMEN AND PELVIS WITH CONTRAST TECHNIQUE: Multidetector CT imaging of the abdomen and pelvis was performed using the standard protocol following bolus administration of intravenous contrast. RADIATION DOSE REDUCTION: This exam was performed according to the departmental dose-optimization program which includes automated exposure control, adjustment of the mA and/or kV according to patient size and/or use of iterative reconstruction technique. CONTRAST:  75mL OMNIPAQUE  IOHEXOL  350 MG/ML SOLN COMPARISON:  CT abdomen/pelvis dated 10/10/2019. FINDINGS: Lower chest: Patchy ground-glass and tree-in-bud nodular opacities in the left lower lobe, may be infectious/inflammatory. Partially visualized cardiac leads. Hepatobiliary: 1.2 cm intermediate density lesion in the right hepatic lobe, previously measuring 2 cm and simple fluid attenuation. This is nonspecific and favored to represent a benign etiology such as a cyst with proteinaceous debris. Similar subcentimeter focal hypodensities in the left hepatic lobe are too small to definitively characterize. Gallbladder is unremarkable. No biliary dilatation. Pancreas: Unremarkable. No pancreatic ductal dilatation or surrounding inflammatory changes. Spleen: Normal in size without focal abnormality. Adrenals/Urinary Tract: Adrenal glands are unremarkable. Kidneys are normal, without renal calculi, focal lesion, or hydronephrosis. Bladder is  unremarkable. Stomach/Bowel: Stomach is within normal limits. Status post appendectomy. No evidence of obstruction or focal inflammatory changes. Extensive colonic diverticulosis, most pronounced in the descending and sigmoid colon without evidence of acute diverticulitis. Vascular/Lymphatic: Abdominal aorta is normal in caliber with atherosclerotic calcification. No enlarged abdominal or pelvic lymph nodes. Reproductive: Prostate is enlarged measuring up to 5.9 cm in diameter. Other: No abdominal wall hernia or abnormality. No abdominopelvic ascites. No free air. Musculoskeletal: No acute osseous abnormality. No suspicious osseous lesion. Multilevel degenerative changes of the lumbar spine with bulky anterior bridging osteophytosis of the mid to lower lumbar spine. Mild-to-moderate osteoarthritis of the bilateral hips. IMPRESSION: 1. No acute localizing findings in the abdomen or pelvis. 2. Extensive  colonic diverticulosis, most pronounced in the descending and sigmoid colon, without evidence of acute diverticulitis. 3. Prostatomegaly. 4. 1.2 cm intermediate density lesion in the right hepatic lobe, previously measuring 2 cm and simple fluid attenuation. This is nonspecific and favored to represent a benign etiology such as a cyst with proteinaceous debris. 5. Patchy ground-glass and tree-in-bud nodular opacities in the left lower lobe may be infectious/inflammatory. 6.  Aortic Atherosclerosis (ICD10-I70.0). Electronically Signed   By: Mannie Seek M.D.   On: 12/21/2023 13:48           LOS: 4 days   Time spent= 35 mins    Maggie Schooner, MD [facility]  If 7PM-7AM, please contact night-coverage  12/22/2023, 10:53 AM

## 2023-12-22 NOTE — Progress Notes (Signed)
 John Keith to be discharged Home per MD order. Discussed prescriptions and follow up appointments with the patient. Medication list explained in detail with patient and wife and also meds from [facility] given to the patient. Patient verbalized understanding.  Skin clean, dry and intact without evidence of skin break down, no evidence of skin tears noted. IV catheter discontinued intact. Site without signs and symptoms of complications. Dressing and pressure applied. Pt denies pain at the site currently. No complaints noted.  Patient free of lines, drains, and wounds.   An After Visit Summary (AVS) was printed and given to the patient. Patient escorted via wheelchair, and discharged home via private auto.  Deberah Falconer, RN

## 2023-12-22 NOTE — TOC Progression Note (Signed)
 Transition of Care [facility]) - Progression Note    Patient Details  Name: John Keith MRN: [id number] Date of Birth: Dec 29, 1950  [facility]) CM/SW Contact  Valery Gaucher, [place] Phone Number: 12/22/2023, 10:07 AM  Clinical Narrative:     Contacted patient's spouse,John Keith-informed, he was approved for STR but was denied approval for PTAR. She states the plan is for the patient to d/c home. She states she no longer wants the patient to d/c to [facility].   MD, CM updated.   Liddie Reel, MSW, LCSW Clinical Social Worker    Expected Discharge Plan: Skilled Nursing Facility Barriers to Discharge: Other (must enter comment) (SNF pending bed availability)  Expected Discharge Plan and Services In-house Referral: Clinical Social Work   Post Acute Care Choice: Skilled Nursing Facility Living arrangements for the past 2 months: Single Family Home                                       Social Determinants of Health (SDOH) Interventions SDOH Screenings   Food Insecurity: No Food Insecurity (12/18/2023)  Housing: Low Risk  (12/18/2023)  Transportation Needs: No Transportation Needs (12/18/2023)  Utilities: Not At Risk (12/18/2023)  Alcohol Screen: Low Risk  (11/22/2022)  Depression (PHQ2-9): Low Risk  (08/29/2023)  Financial Resource Strain: Low Risk  (11/22/2022)  Physical Activity: Sufficiently Active (11/22/2022)  Social Connections: Patient Declined (12/18/2023)  Stress: No Stress Concern Present (11/22/2022)  Tobacco Use: Medium Risk (12/18/2023)    Readmission Risk Interventions    12/19/2023    2:27 PM  Readmission Risk Prevention Plan  Post Dischage Appt Complete  Medication Screening Complete  Transportation Screening Complete

## 2023-12-22 NOTE — TOC Transition Note (Signed)
 Transition of Care [facility]) - Discharge Note   Patient Details  Name: DELMON ANDRADA MRN: [id number] Date of Birth: 25-Dec-1950  Transition of Care [facility]) CM/SW Contact:  Tom-Johnson, Antwann Preziosi Daphne, RN Phone Number: 12/22/2023, 10:35 AM   Clinical Narrative:     Patient is scheduled for discharge today.  Readmission Risk Assessment done. Home health info, Outpatient f/u, hospital f/u and discharge instructions on AVS. RW and Tub bench recommended, Insurance does not cover Tub bench, patient's wife Delmar Ferrara states she will purchase out of hospital. RW ordered from [employer] and [facility] to deliver to patient at bedside.  Prescriptions sent to [facility] pharmacy and patient will receive meds prior discharge. Wife, Gwendolyn to transport at discharge.  No further TOC needs noted.      Final next level of care: Home w Home Health Services Barriers to Discharge: Barriers Resolved   Patient Goals and CMS Choice Patient states their goals for this hospitalization and ongoing recovery are:: To return home CMS Medicare.gov Compare Post Acute Care list provided to:: Patient Choice offered to / list presented to : Patient, Spouse      Discharge Placement                Patient to be transferred to facility by: Wife Name of family member notified: [facility]    Discharge Plan and Services Additional resources added to the After Visit Summary for   In-house Referral: Clinical Social Work   Post Acute Care Choice: Skilled Nursing Facility          DME Arranged: Otho Blitz rolling DME Agency: [employer] Date DME Agency Contacted: 12/22/23 Time DME Agency Contacted: 1030 Representative spoke with at DME Agency: Raechel Bulla HH Arranged: PT, OT, RN, Nurse's Aide, Disease Management HH Agency: [facility] Date [facility] Agency Contacted: 12/22/23 Time HH Agency Contacted: 1032 Representative spoke with at [facility] Agency: Amy  Social Drivers of Health (SDOH) Interventions SDOH Screenings   Food  Insecurity: No Food Insecurity (12/18/2023)  Housing: Low Risk  (12/18/2023)  Transportation Needs: No Transportation Needs (12/18/2023)  Utilities: Not At Risk (12/18/2023)  Alcohol Screen: Low Risk  (11/22/2022)  Depression (PHQ2-9): Low Risk  (08/29/2023)  Financial Resource Strain: Low Risk  (11/22/2022)  Physical Activity: Sufficiently Active (11/22/2022)  Social Connections: Patient Declined (12/18/2023)  Stress: No Stress Concern Present (11/22/2022)  Tobacco Use: Medium Risk (12/18/2023)     Readmission Risk Interventions    12/19/2023    2:27 PM  Readmission Risk Prevention Plan  Post Dischage Appt Complete  Medication Screening Complete  Transportation Screening Complete

## 2023-12-22 NOTE — Progress Notes (Signed)
 Mobility Specialist Progress Note:    12/22/23 1033  Mobility  Activity Transferred from bed to chair  Level of Assistance Contact guard assist, steadying assist  Assistive Device Front wheel walker  Distance Ambulated (ft) 3 ft  Activity Response Tolerated well  Mobility Referral Yes  Mobility visit 1 Mobility  Mobility Specialist Start Time (ACUTE ONLY) [id number]  Mobility Specialist Stop Time (ACUTE ONLY) 0936  Mobility Specialist Time Calculation (min) (ACUTE ONLY) 10 min   Pt received in bed, agreeable to mobility. No assistance required. No c/o. Pt left in chair w/ alarm on. Personal belongings and call light within reach. All needs met. NT aware.   Doak Free Mobility Specialist  Please contact via [employer] or  Rehab Office [phone]

## 2023-12-23 ENCOUNTER — Telehealth: Payer: Self-pay | Admitting: Family Medicine

## 2023-12-23 ENCOUNTER — Telehealth: Payer: Self-pay

## 2023-12-23 DIAGNOSIS — R262 Difficulty in walking, not elsewhere classified: Secondary | ICD-10-CM | POA: Diagnosis not present

## 2023-12-23 DIAGNOSIS — R269 Unspecified abnormalities of gait and mobility: Secondary | ICD-10-CM | POA: Diagnosis not present

## 2023-12-23 NOTE — Transitions of Care (Post Inpatient/ED Visit) (Signed)
   12/23/2023  Name: John Keith MRN: [id number] DOB: Mar 11, 1951  Today's TOC FU Call Status: Today's TOC FU Call Status:: Unsuccessful Call (1st Attempt) Unsuccessful Call (1st Attempt) Date: 12/23/23  Attempted to reach the patient regarding the most recent Inpatient/ED visit.  Follow Up Plan: Additional outreach attempts will be made to reach the patient to complete the Transitions of Care (Post Inpatient/ED visit) call.   Gareld June, BSN, RN [place]  VBCI - [employer] [facility] RN Care Manager [phone]

## 2023-12-23 NOTE — Telephone Encounter (Unsigned)
 Copied from CRM [id number]. Topic: Clinical - Home Health Verbal Orders >> Dec 23, 2023 11:49 AM Pam Bode wrote: Caller/AgencyLennart Quitter [facility]  Callback Number: [phone]  Service Requested: Physical Therapy & Skilled Nursing  Frequency: 1 time a week Any new concerns about the patient? No

## 2023-12-23 NOTE — Telephone Encounter (Signed)
 Left message to return call to our office.

## 2023-12-26 ENCOUNTER — Telehealth: Payer: Self-pay

## 2023-12-26 ENCOUNTER — Other Ambulatory Visit (HOSPITAL_COMMUNITY): Payer: Self-pay | Admitting: Internal Medicine

## 2023-12-26 ENCOUNTER — Other Ambulatory Visit (HOSPITAL_COMMUNITY): Payer: Self-pay

## 2023-12-26 DIAGNOSIS — I10 Essential (primary) hypertension: Secondary | ICD-10-CM | POA: Diagnosis not present

## 2023-12-26 DIAGNOSIS — Z7901 Long term (current) use of anticoagulants: Secondary | ICD-10-CM | POA: Diagnosis not present

## 2023-12-26 DIAGNOSIS — Z7984 Long term (current) use of oral hypoglycemic drugs: Secondary | ICD-10-CM | POA: Diagnosis not present

## 2023-12-26 DIAGNOSIS — I4892 Unspecified atrial flutter: Secondary | ICD-10-CM | POA: Diagnosis not present

## 2023-12-26 DIAGNOSIS — M1990 Unspecified osteoarthritis, unspecified site: Secondary | ICD-10-CM | POA: Diagnosis not present

## 2023-12-26 DIAGNOSIS — N390 Urinary tract infection, site not specified: Secondary | ICD-10-CM | POA: Diagnosis not present

## 2023-12-26 NOTE — Telephone Encounter (Signed)
 Unable to contact patient on both phone numbers, no voice mail. Sent a MyChart message letting the patient know he has an appointment on 01/04/2024 at 1pm with Dr Onesimo.  Sending MyChart message.

## 2023-12-26 NOTE — Transitions of Care (Post Inpatient/ED Visit) (Signed)
 12/26/2023  Name: John Keith MRN: [id number] DOB: 06-21-1951  Today's TOC FU Call Status: Today's TOC FU Call Status:: Successful TOC FU Call Completed TOC FU Call Complete Date: 12/26/23 Patient's Name and Date of Birth confirmed.  Transition Care Management Follow-up Telephone Call Date of Discharge: 12/22/23 Discharge Facility: [facility]) Type of Discharge: Inpatient Admission Primary Inpatient Discharge Diagnosis:: UTI How have you been since you were released from the hospital?: Better Any questions or concerns?: No  Items Reviewed: Did you receive and understand the discharge instructions provided?: Yes Medications obtained,verified, and reconciled?: Yes (Medications Reviewed) Any new allergies since your discharge?: No Dietary orders reviewed?: Yes Type of Diet Ordered:: Low Sodium Heart Healthy Do you have support at home?: Yes People in Home [RPT]: spouse Name of Support/Comfort Primary Source: Othel Malady  Medications Reviewed Today: Medications Reviewed Today     Reviewed by Moises Reusing, RN (Case Manager) on 12/26/23 at 1533  Med List Status: <None>   Medication Order Taking? Sig Documenting Provider Last Dose Status Informant  apixaban  (ELIQUIS ) 5 MG TABS tablet [id number] Yes Take 1 tablet (5 mg total) by mouth 2 (two) times daily. [name], [name], MD  Active Self, Other, Pharmacy Records, Multiple Informants  calcium  carbonate (OS-CAL - DOSED IN MG OF ELEMENTAL CALCIUM ) 1250 (500 Ca) MG tablet [id number] Yes Take 1 tablet (500 mg of elemental calcium  total) by mouth daily with breakfast. Colletta Manuelita Garre, PA-C  Active Self, Other, Pharmacy Records, Multiple Informants           Med Note RANELLE KEENE HERO   Mon May 30, 2023  9:17 AM)    carvedilol  (COREG ) 6.25 MG tablet [id number] Yes Take 1 tablet (6.25 mg total) by mouth 2 (two) times daily with a meal. [name], [name], MD  Active Self, Other, Pharmacy Records, Multiple  Informants  cefadroxil  (DURICEF) 500 MG capsule [id number] Yes Take 1 capsule (500 mg total) by mouth 2 (two) times daily for 5 days. Amin, Ankit C, MD  Active   Cholecalciferol  (VITAMIN D -3) 125 MCG (5000 UT) TABS [id number] Yes Take 1 tablet by mouth daily. Colletta Manuelita Garre, PA-C  Active Self, Other, Pharmacy Records, Multiple Informants  ezetimibe  (ZETIA ) 10 MG tablet [id number] Yes Take 1 tablet (10 mg total) by mouth daily. [name], [name], MD  Active Self, Other, Pharmacy Records, Multiple Informants  FARXIGA  10 MG TABS tablet [id number] Yes Take 1 tablet (10 mg total) by mouth daily. [name], [name], MD  Active Self, Other, Pharmacy Records, Multiple Informants  ferrous sulfate  325 (65 FE) MG tablet [id number] Yes Take 325 mg by mouth daily with breakfast. [provider]  Active Self, Other, Pharmacy Records, Multiple Informants  folic acid  (FOLVITE ) 1 MG tablet [id number] Yes Take 1 tablet (1 mg total) by mouth daily. [place], Harlene HERO, FNP  Active Self, Other, Pharmacy Records, Multiple Informants  gabapentin  (NEURONTIN ) 300 MG capsule [id number] Yes TAKE 3 CAPSULES(900 MG) BY MOUTH DAILY [name], MD  Active Self, Other, Pharmacy Records, Multiple Informants  methotrexate  (RHEUMATREX) 2.5 MG tablet [id number] Yes TAKE 20MG (8 TABLETS) EVERY WEDNESDAY. PROTECT FROM LIGHT AS DIRECTED [name], [name], MD  Active Self, Other, Pharmacy Records, Multiple Informants  Multiple Vitamin (MULTIVITAMIN) tablet [id number] Yes Take 1 tablet by mouth daily. [provider]  Active Self, Other, Pharmacy Records, Multiple Informants  mupirocin  ointment (BACTROBAN ) 2 % [id number] Yes Apply 1 Application topically daily. [name], MD  Active Self, Other, Pharmacy Records, Multiple Informants  pantoprazole  (PROTONIX ) 40 MG tablet [id number] Yes Take 1 tablet (40 mg total) by mouth daily. 30 min before food [name], [name], MD  Active Self, Other, Pharmacy Records, Multiple  Informants  sacubitril -valsartan  (ENTRESTO ) 24-26 MG [id number] Yes Take 1 tablet by mouth 2 (two) times daily. [name], [name], MD  Active Self, Other, Pharmacy Records, Multiple Informants  spironolactone  (ALDACTONE ) 25 MG tablet [id number] Yes Take 1 tablet (25 mg total) by mouth daily. [name], [name], MD  Active Self, Other, Pharmacy Records, Multiple Informants            Home Care and Equipment/Supplies: Were Home Health Services Ordered?: Yes Name of Home Health Agency:: [phone] Has Agency set up a time to come to your home?: No EMR reviewed for Home Health Orders: Orders present/patient has not received call (refer to CM for follow-up) (CM contacted [facility]. A PT is on the way to set up a time for [facility]) Any new equipment or medical supplies ordered?: Yes Name of Medical supply agency?: [employer] Were you able to get the equipment/medical supplies?: Yes Do you have any questions related to the use of the equipment/supplies?: No  Functional Questionnaire: Do you need assistance with bathing/showering or dressing?: No Do you need assistance with meal preparation?: No Do you need assistance with eating?: No Do you have difficulty maintaining continence: No Do you need assistance with getting out of bed/getting out of a chair/moving?: No Do you have difficulty managing or taking your medications?: No  Follow up appointments reviewed: PCP Follow-up appointment confirmed?: Yes Date of PCP follow-up appointment?: 01/04/24 Follow-up Provider: Dr. Larrie Specialist [facility] Follow-up appointment confirmed?: NA Do you need transportation to your follow-up appointment?: No Do you understand care options if your condition(s) worsen?: Yes-patient verbalized understanding  SDOH Interventions Today    Flowsheet Row Most Recent Value  SDOH Interventions   Food Insecurity Interventions Intervention Not Indicated  Housing Interventions Intervention Not Indicated  Transportation  Interventions Intervention Not Indicated  Utilities Interventions Intervention Not Indicated    Medford Balboa, BSN, RN [place]  VBCI - [facility] RN Care Manager [phone]

## 2023-12-26 NOTE — Progress Notes (Signed)
 Paramedicine Encounter    Patient ID: John Keith, male    DOB: 07-Aug-1950, 73 y.o.   MRN: [id number]   Complaints- recent UTI hospitalization    Assessment- CAOx4, warm and dry   Compliance with meds- no missed meds- almost finished with antibiotics   Pill box filled- one week   Refills needed- methrotrexate   Meds changes since last visit- added duricef and zithromax  (will complete in two days)    Social changes- none    VISIT SUMMARY- Arrived for home visit for John Keith who was seated at the kitchen table alert and oriented reporting to be feeling better this week. Him and his wife reports he had been sent to the ER after experiencing some confusion and weakness. He is being treated for a UTI. He will complete his antibiotics in two days. He has been feeling better and says that he is still a little weak and foggy but otherwise doing well. He is urinating in small bouts but no foul odor or color at present. I advised him s/s's to look for in the coming says. I assisted him in planning a follow up with PCP on 7/2. I plan to follow up in one week. He agreed with plan. I obtained vitals and assessment as noted. We reviewed meds and confirmed same. Pill box filled for one week.   BP 110/62   Pulse 66   Resp 16   Wt 229 lb 6.4 oz (104.1 kg)   SpO2 98%   BMI 24.58 kg/m  Weight yesterday-- didn't weigh  Last visit weight-- 230lbs      ACTION: Home visit completed     Patient Care Team: [name], MD as PCP - General (Family Medicine) [name], MD as PCP - Electrophysiology (Cardiology) [name], [name], MD as PCP - Cardiology (Cardiology) [name], MD as Consulting Physician (Internal Medicine) [name], MD as Attending Physician (Cardiology) [name], MD as Consulting Physician (Cardiology)  Patient Active Problem List   Diagnosis Date Noted   Acute encephalopathy 12/18/2023   On anticoagulant therapy 08/29/2023   On  amiodarone  therapy 08/29/2023   Dermatitis 01/31/2023   Onychomycosis 01/31/2023   Statin myopathy 11/15/2022   Abnormal glucose 11/15/2022   Prostate cancer screening 11/15/2022   Chronic systolic heart failure (HCC) 12/15/2021   Chronic combined systolic and diastolic CHF (congestive heart failure) (HCC) 12/15/2021   Nasal sore 12/15/2021   Syncope 12/07/2021   Ventricular tachycardia (HCC) 12/07/2021   Typical atrial flutter (HCC) 09/24/2021   Secondary hypercoagulable state (HCC) 09/24/2021   Abnormal x-ray of neck 06/16/2021   Abnormal MRI, lumbar spine 10/23/2020   Chronic low back pain 10/23/2020   Neuropathy 10/23/2020   Coronary artery disease of native artery of native heart with stable angina pectoris (HCC) 10/23/2020   Impingement syndrome of left shoulder region 08/22/2020   Pain in joint of left shoulder 07/24/2020   Chronic left shoulder pain 07/11/2020   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   Anemia 12/13/2019   Chest pain of uncertain etiology 10/24/2019   PVC (premature ventricular contraction) 10/24/2019   Gastric ulcer without hemorrhage or perforation 09/28/2019   COVID-19 virus detected 07/12/2019   Elevated troponin 07/07/2019   Elevated serum protein level 07/07/2019   Postherpetic neuralgia 08/17/2018   Benign prostatic hyperplasia 08/01/2018   Hip pain 06/29/2018   Aortic atherosclerosis (HCC) 09/22/2016   Fatigue 06/03/2016   Lower urinary tract symptoms (LUTS) 06/03/2016   Constipation 03/10/2016   Lumbar radiculopathy  02/20/2016   Encounter for immunization 11/26/2015   Insomnia 06/10/2015   External hemorrhoid 03/07/2015   Health care maintenance 01/09/2014   Carpal tunnel syndrome of left wrist 01/03/2013   Hyperlipidemia 11/09/2012   Vitamin D  deficiency 11/09/2012   Essential hypertension 11/06/2012   Tobacco abuse 11/06/2012   Seasonal allergies 11/06/2012   Erectile dysfunction 11/06/2012   GERD (gastroesophageal reflux disease)  11/06/2012   DDD (degenerative disc disease), thoracolumbar 11/06/2012   Lactose intolerance 11/06/2012   Diverticulosis of colon without hemorrhage 11/06/2012   Diverticulosis of colon 11/06/2012   Degeneration of thoracolumbar intervertebral disc 11/06/2012   Gastroesophageal reflux disease 11/06/2012   Tobacco user 11/06/2012    Current Outpatient Medications:    apixaban  (ELIQUIS ) 5 MG TABS tablet, Take 1 tablet (5 mg total) by mouth 2 (two) times daily., Disp: 180 tablet, Rfl: 3   calcium  carbonate (OS-CAL - DOSED IN MG OF ELEMENTAL CALCIUM ) 1250 (500 Ca) MG tablet, Take 1 tablet (500 mg of elemental calcium  total) by mouth daily with breakfast., Disp: 30 tablet, Rfl: 11   carvedilol  (COREG ) 6.25 MG tablet, Take 1 tablet (6.25 mg total) by mouth 2 (two) times daily with a meal., Disp: 180 tablet, Rfl: 3   cefadroxil  (DURICEF) 500 MG capsule, Take 1 capsule (500 mg total) by mouth 2 (two) times daily for 5 days., Disp: 10 capsule, Rfl: 0   Cholecalciferol  (VITAMIN D -3) 125 MCG (5000 UT) TABS, Take 1 tablet by mouth daily., Disp: 30 tablet, Rfl: 11   ezetimibe  (ZETIA ) 10 MG tablet, Take 1 tablet (10 mg total) by mouth daily., Disp: 90 tablet, Rfl: 3   FARXIGA  10 MG TABS tablet, Take 1 tablet (10 mg total) by mouth daily., Disp: 90 tablet, Rfl: 3   ferrous sulfate  325 (65 FE) MG tablet, Take 325 mg by mouth daily with breakfast., Disp: , Rfl:    folic acid  (FOLVITE ) 1 MG tablet, Take 1 tablet (1 mg total) by mouth daily., Disp: 30 tablet, Rfl: 6   gabapentin  (NEURONTIN ) 300 MG capsule, TAKE 3 CAPSULES(900 MG) BY MOUTH DAILY, Disp: 270 capsule, Rfl: 1   methotrexate  (RHEUMATREX) 2.5 MG tablet, TAKE 20MG (8 TABLETS) EVERY WEDNESDAY. PROTECT FROM LIGHT AS DIRECTED, Disp: 150 tablet, Rfl: 0   Multiple Vitamin (MULTIVITAMIN) tablet, Take 1 tablet by mouth daily., Disp: , Rfl:    mupirocin  ointment (BACTROBAN ) 2 %, Apply 1 Application topically daily., Disp: 22 g, Rfl: 3   pantoprazole  (PROTONIX )  40 MG tablet, Take 1 tablet (40 mg total) by mouth daily. 30 min before food, Disp: 30 tablet, Rfl: 6   sacubitril -valsartan  (ENTRESTO ) 24-26 MG, Take 1 tablet by mouth 2 (two) times daily., Disp: 180 tablet, Rfl: 3   spironolactone  (ALDACTONE ) 25 MG tablet, Take 1 tablet (25 mg total) by mouth daily., Disp: 90 tablet, Rfl: 3 Allergies  Allergen Reactions   Crestor  [Rosuvastatin  Calcium ] Other (See Comments)    Elevated muscle enzymes    Lipitor [Atorvastatin  Calcium ] Other (See Comments)    Elevated muscle enzymes      Social History   Socioeconomic History   Marital status: Married    Spouse name: Gwendolyn   Number of children: 2   Years of education: Not on file   Highest education level: High school graduate  Occupational History   Occupation: Retired    Comment: Semi  Tobacco Use   Smoking status: Former    Current packs/day: 0.00    Average packs/day: 0.5 packs/day for 49.0 years (24.5 ttl pk-yrs)  Types: Cigarettes    Start date: 07/06/1971    Quit date: 06/26/2020    Years since quitting: 3.5   Smokeless tobacco: Never   Tobacco comments:    Former smoker 09/24/21  Vaping Use   Vaping status: Never Used  Substance and Sexual Activity   Alcohol use: No    Alcohol/week: 0.0 standard drinks of alcohol   Drug use: No   Sexual activity: Not on file  Other Topics Concern   Not on file  Social History Narrative   Married    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    On child    1 son, 1 daughter   Systems analyst op   Owns guns    Wears seat belt, safe in relationship    Smoker    Retired 06/27/2019   Social Drivers of Health   Financial Resource Strain: Low Risk  (11/22/2022)   Overall Financial Resource Strain (CARDIA)    Difficulty of Paying Living Expenses: Not hard at all  Food Insecurity: No Food Insecurity (12/18/2023)   Hunger Vital Sign    Worried About Running Out of Food in the Last Year: Never true    Ran Out of Food in the Last Year: Never true  Transportation Needs: No  Transportation Needs (12/18/2023)   PRAPARE - Administrator, Civil Service (Medical): No    Lack of Transportation (Non-Medical): No  Physical Activity: Sufficiently Active (11/22/2022)   Exercise Vital Sign    Days of Exercise per Week: 3 days    Minutes of Exercise per Session: 60 min  Stress: No Stress Concern Present (11/22/2022)   [employer] of Occupational Health - Occupational Stress Questionnaire    Feeling of Stress : Not at all  Social Connections: Patient Declined (12/18/2023)   Social Connection and Isolation Panel    Frequency of Communication with Friends and Family: Patient declined    Frequency of Social Gatherings with Friends and Family: Patient declined    Attends Religious Services: Patient declined    Active Member of Clubs or Organizations: Patient declined    Attends Banker Meetings: Patient declined    Marital Status: Patient declined  Intimate Partner Violence: Patient Unable To Answer (12/18/2023)   Humiliation, Afraid, Rape, and Kick questionnaire    Fear of Current or Ex-Partner: Patient unable to answer    Emotionally Abused: Patient unable to answer    Physically Abused: Patient unable to answer    Sexually Abused: Patient unable to answer    Physical Exam      Future Appointments  Date Time Provider Department Center  02/23/2024  9:15 AM McCaughan, Awanda BIRCH, DPM [facility]  02/27/2024  8:40 AM Onesimo Claude, MD [facility]  03/09/2024  7:00 AM [facility] DEVICE REMOTES [facility]  06/08/2024  7:00 AM [facility] DEVICE REMOTES [facility]  06/18/2024 10:00 AM Bair, Luke, MD [facility]

## 2023-12-28 ENCOUNTER — Telehealth: Payer: Self-pay

## 2023-12-28 NOTE — Telephone Encounter (Signed)
 Notified.

## 2023-12-28 NOTE — Telephone Encounter (Signed)
 Copied from CRM [id number]. Topic: Clinical - Home Health Verbal Orders >> Dec 28, 2023  8:08 AM Deaijah H wrote: Caller/Agency: Kate [facility] Callback Number: [phone] Service Requested: Physical Therapy Frequency: once to twice a week for 8wks Any new concerns about the patient? No

## 2024-01-02 ENCOUNTER — Other Ambulatory Visit (HOSPITAL_COMMUNITY): Payer: Self-pay

## 2024-01-02 NOTE — Progress Notes (Signed)
 Paramedicine Encounter    Patient ID: John Keith, male    DOB: [birth year], 73 y.o.   MRN: [id number]   Complaints- itchy throat and sinus drainage   Assessment- CAOx4, warm and dry ambulatory without shortness of breath, no dizziness, no chest pain, no shortness of breath, no swelling or weight gain. Lungs clear. Vitals within normal limits. Reports urine is clear with no odor. Normal BM's.   Compliance with meds- no missed doses   Pill box filled- for two weeks   Refills needed- none   Meds changes since last visit- none     Social changes- none    VISIT SUMMARY- Arrived for home visit for John Keith who reports to be feeling better this week. He denied any chest pain, swelling, shortness of breath, dizziness. He says his urine is back to baseline and his brain fog is gone. He completed all antibiotics and follows up with PCP this week. Vitals as noted in report. Lungs clear. No edema noted. I reviewed upcoming appointments. I confirmed I would be coming back our for a follow up next Thursday. I reviewed meds and confirmed same filling pill box for two weeks. No refills needed except some OTC Vitamin D3. He plans to pick up this week. Home visit complete. I will see him in one and a half weeks.   BP 110/62   Pulse 83   Resp 16   Wt 224 lb 9.6 oz (101.9 kg)   SpO2 95%   BMI 24.07 kg/m  Weight yesterday-- 225 Last visit weight-- 229      ACTION: Home visit completed     Patient Care Team: [name], MD as PCP - General (Family Medicine) [name], MD as PCP - Electrophysiology (Cardiology) [name], [name], MD as PCP - Cardiology (Cardiology) [name], MD as Consulting Physician (Internal Medicine) [name], MD as Attending Physician (Cardiology) [name], MD as Consulting Physician (Cardiology)  Patient Active Problem List   Diagnosis Date Noted   Acute encephalopathy 12/18/2023   On anticoagulant therapy 08/29/2023    On amiodarone  therapy 08/29/2023   Dermatitis 01/31/2023   Onychomycosis 01/31/2023   Statin myopathy 11/15/2022   Abnormal glucose 11/15/2022   Prostate cancer screening 11/15/2022   Chronic systolic heart failure (HCC) 12/15/2021   Chronic combined systolic and diastolic CHF (congestive heart failure) (HCC) 12/15/2021   Nasal sore 12/15/2021   Syncope 12/07/2021   Ventricular tachycardia (HCC) 12/07/2021   Typical atrial flutter (HCC) 09/24/2021   Secondary hypercoagulable state (HCC) 09/24/2021   Abnormal x-ray of neck 06/16/2021   Abnormal MRI, lumbar spine 10/23/2020   Chronic low back pain 10/23/2020   Neuropathy 10/23/2020   Coronary artery disease of native artery of native heart with stable angina pectoris (HCC) 10/23/2020   Impingement syndrome of left shoulder region 08/22/2020   Pain in joint of left shoulder 07/24/2020   Chronic left shoulder pain 07/11/2020   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   Anemia 12/13/2019   Chest pain of uncertain etiology 10/24/2019   PVC (premature ventricular contraction) 10/24/2019   Gastric ulcer without hemorrhage or perforation 09/28/2019   COVID-19 virus detected 07/12/2019   Elevated troponin 07/07/2019   Elevated serum protein level 07/07/2019   Postherpetic neuralgia 08/17/2018   Benign prostatic hyperplasia 08/01/2018   Hip pain 06/29/2018   Aortic atherosclerosis (HCC) 09/22/2016   Fatigue 06/03/2016   Lower urinary tract symptoms (LUTS) 06/03/2016   Constipation 03/10/2016   Lumbar radiculopathy 02/20/2016   Encounter  for immunization 11/26/2015   Insomnia 06/10/2015   External hemorrhoid 03/07/2015   Health care maintenance 01/09/2014   Carpal tunnel syndrome of left wrist 01/03/2013   Hyperlipidemia 11/09/2012   Vitamin D  deficiency 11/09/2012   Essential hypertension 11/06/2012   Tobacco abuse 11/06/2012   Seasonal allergies 11/06/2012   Erectile dysfunction 11/06/2012   GERD (gastroesophageal reflux disease)  11/06/2012   DDD (degenerative disc disease), thoracolumbar 11/06/2012   Lactose intolerance 11/06/2012   Diverticulosis of colon without hemorrhage 11/06/2012   Diverticulosis of colon 11/06/2012   Degeneration of thoracolumbar intervertebral disc 11/06/2012   Gastroesophageal reflux disease 11/06/2012   Tobacco user 11/06/2012    Current Outpatient Medications:    apixaban  (ELIQUIS ) 5 MG TABS tablet, TAKE 1 TABLET(5 MG) BY MOUTH TWICE DAILY, Disp: 60 tablet, Rfl: 11   calcium  carbonate (OS-CAL - DOSED IN MG OF ELEMENTAL CALCIUM ) 1250 (500 Ca) MG tablet, Take 1 tablet (500 mg of elemental calcium  total) by mouth daily with breakfast., Disp: 30 tablet, Rfl: 11   carvedilol  (COREG ) 6.25 MG tablet, Take 1 tablet (6.25 mg total) by mouth 2 (two) times daily with a meal., Disp: 180 tablet, Rfl: 3   Cholecalciferol  (VITAMIN D -3) 125 MCG (5000 UT) TABS, Take 1 tablet by mouth daily., Disp: 30 tablet, Rfl: 11   ezetimibe  (ZETIA ) 10 MG tablet, Take 1 tablet (10 mg total) by mouth daily., Disp: 90 tablet, Rfl: 3   FARXIGA  10 MG TABS tablet, Take 1 tablet (10 mg total) by mouth daily., Disp: 90 tablet, Rfl: 3   ferrous sulfate  325 (65 FE) MG tablet, Take 325 mg by mouth daily with breakfast., Disp: , Rfl:    folic acid  (FOLVITE ) 1 MG tablet, Take 1 tablet (1 mg total) by mouth daily., Disp: 30 tablet, Rfl: 6   gabapentin  (NEURONTIN ) 300 MG capsule, TAKE 3 CAPSULES(900 MG) BY MOUTH DAILY, Disp: 270 capsule, Rfl: 1   methotrexate  (RHEUMATREX) 2.5 MG tablet, TAKE 20MG (8 TABLETS) EVERY WEDNESDAY. PROTECT FROM LIGHT AS DIRECTED, Disp: 150 tablet, Rfl: 0   Multiple Vitamin (MULTIVITAMIN) tablet, Take 1 tablet by mouth daily., Disp: , Rfl:    mupirocin  ointment (BACTROBAN ) 2 %, Apply 1 Application topically daily., Disp: 22 g, Rfl: 3   pantoprazole  (PROTONIX ) 40 MG tablet, Take 1 tablet (40 mg total) by mouth daily. 30 min before food, Disp: 30 tablet, Rfl: 6   sacubitril -valsartan  (ENTRESTO ) 24-26 MG, Take 1  tablet by mouth 2 (two) times daily., Disp: 180 tablet, Rfl: 3   spironolactone  (ALDACTONE ) 25 MG tablet, Take 1 tablet (25 mg total) by mouth daily., Disp: 90 tablet, Rfl: 3 Allergies  Allergen Reactions   Crestor  [Rosuvastatin  Calcium ] Other (See Comments)    Elevated muscle enzymes    Lipitor [Atorvastatin  Calcium ] Other (See Comments)    Elevated muscle enzymes      Social History   Socioeconomic History   Marital status: Married    Spouse name: Gwendolyn   Number of children: 2   Years of education: Not on file   Highest education level: High school graduate  Occupational History   Occupation: Retired    Comment: Semi  Tobacco Use   Smoking status: Former    Current packs/day: 0.00    Average packs/day: 0.5 packs/day for 49.0 years (24.5 ttl pk-yrs)    Types: Cigarettes    Start date: 07/06/1971    Quit date: 06/26/2020    Years since quitting: 3.5   Smokeless tobacco: Never   Tobacco comments:  Former smoker 09/24/21  Vaping Use   Vaping status: Never Used  Substance and Sexual Activity   Alcohol use: No    Alcohol/week: 0.0 standard drinks of alcohol   Drug use: No   Sexual activity: Not on file  Other Topics Concern   Not on file  Social History Narrative   Married    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    On child    1 son, 1 daughter   Machine op   Owns guns    Wears seat belt, safe in relationship    Smoker    Retired 06/27/2019   Social Drivers of Health   Financial Resource Strain: Low Risk  (11/22/2022)   Overall Financial Resource Strain (CARDIA)    Difficulty of Paying Living Expenses: Not hard at all  Food Insecurity: No Food Insecurity (12/26/2023)   Hunger Vital Sign    Worried About Running Out of Food in the Last Year: Never true    Ran Out of Food in the Last Year: Never true  Transportation Needs: No Transportation Needs (12/26/2023)   PRAPARE - Administrator, Civil Service (Medical): No    Lack of Transportation (Non-Medical): No   Physical Activity: Sufficiently Active (11/22/2022)   Exercise Vital Sign    Days of Exercise per Week: 3 days    Minutes of Exercise per Session: 60 min  Stress: No Stress Concern Present (11/22/2022)   [employer] of Occupational Health - Occupational Stress Questionnaire    Feeling of Stress : Not at all  Social Connections: Patient Declined (12/18/2023)   Social Connection and Isolation Panel    Frequency of Communication with Friends and Family: Patient declined    Frequency of Social Gatherings with Friends and Family: Patient declined    Attends Religious Services: Patient declined    Active Member of Clubs or Organizations: Patient declined    Attends Banker Meetings: Patient declined    Marital Status: Patient declined  Intimate Partner Violence: Not At Risk (12/26/2023)   Humiliation, Afraid, Rape, and Kick questionnaire    Fear of Current or Ex-Partner: No    Emotionally Abused: No    Physically Abused: No    Sexually Abused: No    Physical Exam      Future Appointments  Date Time Provider Department Center  01/04/2024  1:00 PM Onesimo Claude, MD [facility]  02/23/2024  9:15 AM McCaughan, Dia D, DPM [facility]  03/09/2024  7:00 AM [facility] DEVICE REMOTES [facility]  06/08/2024  7:00 AM [facility] DEVICE REMOTES [facility]  06/18/2024 10:00 AM Bair, Luke, MD [facility]

## 2024-01-04 ENCOUNTER — Ambulatory Visit: Admitting: Internal Medicine

## 2024-01-04 ENCOUNTER — Ambulatory Visit

## 2024-01-04 VITALS — BP 120/70 | HR 76 | Ht >= 80 in | Wt 230.2 lb

## 2024-01-04 DIAGNOSIS — E782 Mixed hyperlipidemia: Secondary | ICD-10-CM | POA: Diagnosis not present

## 2024-01-04 DIAGNOSIS — B3741 Candidal cystitis and urethritis: Secondary | ICD-10-CM | POA: Insufficient documentation

## 2024-01-04 DIAGNOSIS — Z7901 Long term (current) use of anticoagulants: Secondary | ICD-10-CM | POA: Diagnosis not present

## 2024-01-04 DIAGNOSIS — K219 Gastro-esophageal reflux disease without esophagitis: Secondary | ICD-10-CM | POA: Diagnosis not present

## 2024-01-04 DIAGNOSIS — M5416 Radiculopathy, lumbar region: Secondary | ICD-10-CM | POA: Diagnosis not present

## 2024-01-04 DIAGNOSIS — D8685 Sarcoid myocarditis: Secondary | ICD-10-CM | POA: Diagnosis not present

## 2024-01-04 DIAGNOSIS — R7989 Other specified abnormal findings of blood chemistry: Secondary | ICD-10-CM | POA: Insufficient documentation

## 2024-01-04 DIAGNOSIS — Z95811 Presence of heart assist device: Secondary | ICD-10-CM | POA: Insufficient documentation

## 2024-01-04 DIAGNOSIS — I5042 Chronic combined systolic (congestive) and diastolic (congestive) heart failure: Secondary | ICD-10-CM

## 2024-01-04 DIAGNOSIS — Z79899 Other long term (current) drug therapy: Secondary | ICD-10-CM | POA: Diagnosis not present

## 2024-01-04 DIAGNOSIS — E871 Hypo-osmolality and hyponatremia: Secondary | ICD-10-CM | POA: Diagnosis not present

## 2024-01-04 DIAGNOSIS — Z122 Encounter for screening for malignant neoplasm of respiratory organs: Secondary | ICD-10-CM

## 2024-01-04 DIAGNOSIS — G5603 Carpal tunnel syndrome, bilateral upper limbs: Secondary | ICD-10-CM | POA: Insufficient documentation

## 2024-01-04 DIAGNOSIS — I10 Essential (primary) hypertension: Secondary | ICD-10-CM | POA: Diagnosis not present

## 2024-01-04 DIAGNOSIS — L989 Disorder of the skin and subcutaneous tissue, unspecified: Secondary | ICD-10-CM | POA: Insufficient documentation

## 2024-01-04 DIAGNOSIS — D649 Anemia, unspecified: Secondary | ICD-10-CM | POA: Diagnosis not present

## 2024-01-04 DIAGNOSIS — I5022 Chronic systolic (congestive) heart failure: Secondary | ICD-10-CM

## 2024-01-04 DIAGNOSIS — M1991 Primary osteoarthritis, unspecified site: Secondary | ICD-10-CM | POA: Insufficient documentation

## 2024-01-04 MED ORDER — KETOCONAZOLE 2 % EX SHAM: 1.0000 | MEDICATED_SHAMPOO | CUTANEOUS | 1 refills | Status: DC

## 2024-01-04 NOTE — Assessment & Plan Note (Signed)
 Chronic.  Euvolemic today.  Continue GDMT as recommended by cardiology.

## 2024-01-04 NOTE — Patient Instructions (Addendum)
 If you are interested in the shingles vaccine series (Shingrix ), call your insurance or pharmacy to check on coverage and location it must be given.  If affordable - you can schedule it here or at your pharmacy depending on coverage.    Please let me know if the rash on back gets more red, spreads to other parts of body, has signs of infection like redness, discharge. Wearing 100% cotton underwear can be helpful.

## 2024-01-04 NOTE — Assessment & Plan Note (Signed)
 Long-term use of amiodarone . Low TSH, mildly elevated T4 during recent hospitalization. Repeat TSH, free T4. If continues to be abnormal will reach out to patient's cardiologist, consider endocrine referral to evaluate if patient is developing amiodarone  induced thyrotoxicity.

## 2024-01-04 NOTE — Assessment & Plan Note (Signed)
 GERD, h/o duodenal ulcer, hiatal hernia evident on EGD from 07/03/2020.  Symptoms well controlled on Protonix  40 mg daily, continue.

## 2024-01-04 NOTE — Assessment & Plan Note (Signed)
 Healing skin lesions on buttocks.  D/D fungal, bacterial dermatitis. No active sign of infection. Close monitoring and prompt evaluation for worsening symptoms.  Continue zinc cream. Start Ketoconazole 2% shampoo twice a week.

## 2024-01-04 NOTE — Assessment & Plan Note (Signed)
 Evident on recent hospitalization, repeat CMP today.

## 2024-01-04 NOTE — Assessment & Plan Note (Signed)
 On On Apixaban  5 mg, twice a day per cardiology.

## 2024-01-04 NOTE — Progress Notes (Signed)
 Established Patient Office Visit TOC and hospital follow up   Subjective  Patient ID: John Keith, male    DOB: 10-03-1950  Age: 73 y.o. MRN: [id number]  Chief Complaint  Patient presents with   Establish Care    He  has a past medical history of Abnormal x-ray of neck (06/16/2021), Acute encephalopathy (12/18/2023), Allergy, Arthritis, Atrial flutter (HCC), Bilateral carpal tunnel syndrome (01/04/2024), Carpal tunnel syndrome on left, Chest pain of uncertain etiology (10/24/2019), Constipation (03/10/2016), COVID-19, COVID-19 virus detected (07/12/2019), DDD (degenerative disc disease), thoracolumbar, Degenerative joint disease of left shoulder, Diverticulosis, ED (erectile dysfunction), External hemorrhoid (03/07/2015), Fatigue (06/03/2016), GERD (gastroesophageal reflux disease), H. pylori infection, Hyperlipidemia, Hypertension, Insomnia, Lactose intolerance, Low back pain, PVC (premature ventricular contraction) (10/24/2019), Shingles, Smoking, Tobacco abuse (11/06/2012), Toe fracture, left, and Vitamin D  deficiency.  HPI Patient here with his wife, history obtained from both patient and his wife. 1) Recent hospitalization: 6/15-6/19 for encephalopathy suspected secondary to UTI. He was started on Tramadol  and Rovaxin for back pain prior to this episode happened. He is currently not taking Robaxin, unsure if he is back to taking Tramadol .   CT head in the hospital showed: chronic small vessel ischemic changes. Chest x-ray chest: normal.  During his hospital stay he was found to have hyponatremia, normocytic normochromic anemia, hypocalcemia.  Urine culture was positive for yeast with 10,000 colonies.  Patient was also found to have elevated free T4 at 1.31 and low TSH at 0.274. RUQ ultrasound was normal.  Patient's discharge note also reports CT abdomen pelvis with atypical appearing opacity in the lungs thus was treated with azithromycin  for 5 days and was discharged on cefadroxil  500  mg, twice a day for 5 days on hospital discharge.   Since hospital discharge:   Continues to have intermittent dysuria.  Patient is back to baseline when it comes to mentation.  Has home health nurse and physical therapy visits.   Patient and wife reports he may not need physical therapy any more.   2) Multiple skin lesion son buttocks: Patient reports noticing skin lesion on buttocks during hospital stay.  She has been applying over-the-counter A&E cream that also has zinc in it.  Seems to be getting better over the last few days.  Denies discharge, spreading lesions, fever, chills.     3) Patient has complex cardiac history including atrial flutter, CHF, CAD s/p stent, hypertension, hyperlipidemia, PVC, cardiac sarcoidosis. Cardiologist: Dr. [name]  On Apixaban  5 mg, twice a day.  Amiodarone  200 mg once a day.   Coreg  6.25 mg twice a day.  Zetia  10 mg daily.  (H/o statin myopathy) Faxiga 10 mg daily.   Methotrexate  20 mg every Wednesday.  Entresto  24-26 BID Spironolactone  25 mg daily.  Denies chest pain, lower leg edema.  4) Chronic lower back pain:  On Gabapentin  300 mg (3 cap at night). Has radiating pain up to his thigh on left side intermittently.  Has had multiple back surgeries in the past.  Pain seems to be tolerable with gabapentin .  5) GERD,  H/O duodenal ulcer, Hiatal hernia evident on EGD from 07/03/2020 Patient taking Protonix  40 mg once a day with symptom control. No unintentional weight loss, change in bowel habit, blood in stool.    ROS As per HPI    Objective:     BP 120/70 (BP Location: Left Arm, Cuff Size: Normal)   Pulse 76   Ht 6' 9 (2.057 m)   Wt 230 lb 3.2 oz (104.4  kg)   SpO2 98%   BMI 24.67 kg/m      01/04/2024    3:48 PM 08/29/2023    8:03 AM 01/31/2023    8:20 AM  Depression screen PHQ 2/9  Decreased Interest 0 0 0  Down, Depressed, Hopeless 0 0 0  PHQ - 2 Score 0 0 0  Altered sleeping 0 0 0  Tired, decreased energy 0 0 0  Change  in appetite 0 1 3  Feeling bad or failure about yourself  0 0 0  Trouble concentrating 0 0 0  Moving slowly or fidgety/restless 0 0 0  Suicidal thoughts 0 0 0  PHQ-9 Score 0 1 3  Difficult doing work/chores Not difficult at all Not difficult at all Not difficult at all      01/04/2024    3:48 PM 08/29/2023    8:03 AM 01/31/2023    8:21 AM 12/13/2019    8:09 AM  GAD 7 : Generalized Anxiety Score  Nervous, Anxious, on Edge 0 0 0 0  Control/stop worrying 0 0 0 0  Worry too much - different things 0 0 0 0  Trouble relaxing 0 0 0 0  Restless 0 0 0 0  Easily annoyed or irritable 0 0 0 0  Afraid - awful might happen 0 0 0 0  Total GAD 7 Score 0 0 0 0  Anxiety Difficulty Not difficult at all Not difficult at all Not difficult at all Not difficult at all      01/04/2024    3:48 PM 08/29/2023    8:03 AM 01/31/2023    8:20 AM  Depression screen PHQ 2/9  Decreased Interest 0 0 0  Down, Depressed, Hopeless 0 0 0  PHQ - 2 Score 0 0 0  Altered sleeping 0 0 0  Tired, decreased energy 0 0 0  Change in appetite 0 1 3  Feeling bad or failure about yourself  0 0 0  Trouble concentrating 0 0 0  Moving slowly or fidgety/restless 0 0 0  Suicidal thoughts 0 0 0  PHQ-9 Score 0 1 3  Difficult doing work/chores Not difficult at all Not difficult at all Not difficult at all      01/04/2024    3:48 PM 08/29/2023    8:03 AM 01/31/2023    8:21 AM 12/13/2019    8:09 AM  GAD 7 : Generalized Anxiety Score  Nervous, Anxious, on Edge 0 0 0 0  Control/stop worrying 0 0 0 0  Worry too much - different things 0 0 0 0  Trouble relaxing 0 0 0 0  Restless 0 0 0 0  Easily annoyed or irritable 0 0 0 0  Afraid - awful might happen 0 0 0 0  Total GAD 7 Score 0 0 0 0  Anxiety Difficulty Not difficult at all Not difficult at all Not difficult at all Not difficult at all   SDOH Screenings   Food Insecurity: No Food Insecurity (12/26/2023)  Housing: Unknown (12/26/2023)  Transportation Needs: No Transportation Needs  (12/26/2023)  Utilities: Not At Risk (12/26/2023)  Alcohol Screen: Low Risk  (11/22/2022)  Depression (PHQ2-9): Low Risk  (01/04/2024)  Financial Resource Strain: Low Risk  (11/22/2022)  Physical Activity: Sufficiently Active (11/22/2022)  Social Connections: Patient Declined (12/18/2023)  Stress: No Stress Concern Present (11/22/2022)  Tobacco Use: Medium Risk (01/04/2024)    Physical Exam HENT:     Head: Normocephalic and atraumatic.     Right Ear: Tympanic membrane  normal.     Left Ear: Tympanic membrane normal.     Mouth/Throat:     Mouth: Mucous membranes are moist.  Eyes:     General:        Right eye: No discharge.  Cardiovascular:     Rate and Rhythm: Normal rate.     Heart sounds: No murmur heard. Pulmonary:     Breath sounds: No wheezing or rales.  Abdominal:     Tenderness: There is no abdominal tenderness. There is no right CVA tenderness, left CVA tenderness, guarding or rebound. Negative signs include Murphy's sign.  Musculoskeletal:     Cervical back: Neck supple. No tenderness.     Right lower leg: No edema.     Left lower leg: No edema.  Lymphadenopathy:     Cervical: No cervical adenopathy.  Skin:    Comments: Multiple healing, scabbed lesions on buttocks without signs of infection  Neurological:     Mental Status: He is alert and oriented to person, place, and time.  Psychiatric:        Mood and Affect: Mood normal.        No results found for any visits on 01/04/24.  The ASCVD Risk score (Arnett DK, et al., 2019) failed to calculate for the following reasons:   Risk score cannot be calculated because patient has a medical history suggesting prior/existing ASCVD     Assessment & Plan:   Hyponatremia Assessment & Plan: Evident on recent hospitalization, repeat CMP today.   Orders: -     Comprehensive metabolic panel with GFR  Normocytic normochromic anemia Assessment & Plan: Evident on recent hospitalization. Likely anemia of chronic disease in  the absence of active bleeding and patient's co-morbidities/medications.  Repeat CBC.  If continues to be anemic will recommend GI referral to r/o GI bleeding (for repeat EGD given h/o duodenal ulcer in the past) and heme/onc referral.   Orders: -     CBC with Differential/Platelet -     Iron, TIBC and Ferritin Panel  Yeast cystitis Assessment & Plan: Evident on urine culture from recent hospital stay. Continues to have intermittent dysuria. Is on Farxiga  10 mg for CHF. Also has a h/o LUTS. Repeat UA, urine culture. If liver enzymes normal, urine culture still positive for yeast and patient is still symptomatic will consider 1 dose of Diflucan 150 mg. If urine culture positive for yeast, patient symptomatic and live enzymes elevated will consider topical antifungal vs ID and urology referral.   Orders: -     Urinalysis -     Urine Culture  Elevated serum free T4 level Assessment & Plan: Long-term use of amiodarone . Low TSH, mildly elevated T4 during recent hospitalization. Repeat TSH, free T4. If continues to be abnormal will reach out to patient's cardiologist, consider endocrine referral to evaluate if patient is developing amiodarone  induced thyrotoxicity.   Orders: -     TSH -     T4, free  Hypocalcemia Assessment & Plan: Evident on recent hospitalization, check CMP, ionized calcium  level, pth hormone level.   Orders: -     Calcium , ionized -     PTH, intact and calcium   Presence of heart assist device [facility]) Assessment & Plan: F/u and management per cardiology    Encounter for screening for lung cancer Assessment & Plan: Qualifies for low dose Ct lung cancer screening. Referral made.   Orders: -     Pulmonary Visit  Skin lesions Assessment & Plan: Healing skin lesions on  buttocks.  D/D fungal, bacterial dermatitis. No active sign of infection. Close monitoring and prompt evaluation for worsening symptoms.  Continue zinc cream. Start Ketoconazole 2% shampoo twice a  week.   Orders: -     Ketoconazole; Apply 1 Application topically 2 (two) times a week.  Dispense: 120 mL; Refill: 1  Essential hypertension Assessment & Plan: BP slightly elevated on arrival. Repeat BP within goal. Continue current medications as recommended by cardiologist.  Continue Coreg  6.25 mg twice daily Continue Spironolactone  25 mg daily    Gastroesophageal reflux disease without esophagitis Assessment & Plan: GERD, h/o duodenal ulcer, hiatal hernia evident on EGD from 07/03/2020.  Symptoms well controlled on Protonix  40 mg daily, continue.    Mixed hyperlipidemia Assessment & Plan: Not on statin due to previous elevated enzymes, statin myopathy.  Continue Zetia  10 mg daily   Lumbar radiculopathy Assessment & Plan: On Gabapentin  300 mg (3 cap at night).  Discussed s/e related to muscle relaxants, opoid, Gabapentin . Given recent h/o encephalopathy, patient's age, multiple co-morbidities avoid polypharmacy.  D/C Tramadol  (was recently started during UC visit), unsure if patient is even taking this since this was put on hold during recent hospitalization.  Patient can try taking Gabapentin  300 mg (2 cap at night), if symptoms stable on 2 capsules he will stay at this dose. If rebound symptoms continue Gabapentin  300 mg (3 capsule at bedtime).  PDMP reviewed.   On amiodarone  therapy Assessment & Plan: Long-term use of amiodarone . Low TSH, mildly elevated T4 during recent hospitalization. Repeat TSH, free T4. If continues to be abnormal will reach out to patient's cardiologist, consider endocrine referral to evaluate if patient is developing amiodarone  induced thyrotoxicity.   - Refill per Cardiology    On anticoagulant therapy Assessment & Plan: On On Apixaban  5 mg, twice a day per cardiology.    Cardiac sarcoidosis Assessment & Plan: On Methotrexate  20 mg every Wednesday, management per cardiology.    Chronic combined systolic and diastolic CHF (congestive  heart failure) (HCC) Assessment & Plan: Chronic.  Euvolemic today.  Continue GDMT as recommended by cardiology.     Chronic systolic heart failure (HCC) Assessment & Plan: Chronic.  Euvolemic today.  Continue GDMT as recommended by cardiology.       Due for shingles immunization:  If you are interested in the shingles vaccine series (Shingrix ), call your insurance or pharmacy to check on coverage and location it must be given.  If affordable - you can schedule it here or at your pharmacy depending on coverage.   I spent 60 minutes on the day of this face-to-face encounter reviewing the patient's medical and surgical history, medications, ongoing concerns, reviewing recent hospitalization admission/discharge notes, labs imaging done during hospitalization and reviewing the assessment and plan with the patient and his wife in detail.  This time also included counseling the patient on their health conditions and management options. Additionally, I spent time post-visit ordering and reviewing diagnostics and therapeutics with the patient.   Return in about 3 months (around 04/05/2024) for Chronic .   Luke Shade, MD

## 2024-01-04 NOTE — Assessment & Plan Note (Signed)
 BP slightly elevated on arrival. Repeat BP within goal. Continue current medications as recommended by cardiologist.  Continue Coreg  6.25 mg twice daily Continue Spironolactone  25 mg daily

## 2024-01-04 NOTE — Assessment & Plan Note (Addendum)
 Evident on urine culture from recent hospital stay. Continues to have intermittent dysuria. Is on Farxiga  10 mg for CHF. Also has a h/o LUTS. Repeat UA, urine culture. If liver enzymes normal, urine culture still positive for yeast and patient is still symptomatic will consider 1 dose of Diflucan 150 mg. If urine culture positive for yeast, patient symptomatic and live enzymes elevated will consider topical antifungal vs ID and urology referral.

## 2024-01-04 NOTE — Assessment & Plan Note (Signed)
 Not on statin due to previous elevated enzymes, statin myopathy.  Continue Zetia  10 mg daily

## 2024-01-04 NOTE — Assessment & Plan Note (Signed)
 On Gabapentin  300 mg (3 cap at night).  Discussed s/e related to muscle relaxants, opoid, Gabapentin . Given recent h/o encephalopathy, patient's age, multiple co-morbidities avoid polypharmacy.  D/C Tramadol  (was recently started during UC visit), unsure if patient is even taking this since this was put on hold during recent hospitalization.  Patient can try taking Gabapentin  300 mg (2 cap at night), if symptoms stable on 2 capsules he will stay at this dose. If rebound symptoms continue Gabapentin  300 mg (3 capsule at bedtime).  PDMP reviewed.

## 2024-01-04 NOTE — Assessment & Plan Note (Signed)
 Evident on recent hospitalization, check CMP, ionized calcium  level, pth hormone level.

## 2024-01-04 NOTE — Assessment & Plan Note (Signed)
 Evident on recent hospitalization. Likely anemia of chronic disease in the absence of active bleeding and patient's co-morbidities/medications.  Repeat CBC.  If continues to be anemic will recommend GI referral to r/o GI bleeding (for repeat EGD given h/o duodenal ulcer in the past) and heme/onc referral.

## 2024-01-04 NOTE — Assessment & Plan Note (Signed)
 Qualifies for low dose Ct lung cancer screening. Referral made.

## 2024-01-04 NOTE — Assessment & Plan Note (Signed)
 On Methotrexate  20 mg every Wednesday, management per cardiology.

## 2024-01-04 NOTE — Assessment & Plan Note (Signed)
 F/u and management per cardiology

## 2024-01-04 NOTE — Assessment & Plan Note (Signed)
 Long-term use of amiodarone . Low TSH, mildly elevated T4 during recent hospitalization. Repeat TSH, free T4. If continues to be abnormal will reach out to patient's cardiologist, consider endocrine referral to evaluate if patient is developing amiodarone  induced thyrotoxicity.   - Refill per Cardiology

## 2024-01-05 ENCOUNTER — Ambulatory Visit: Payer: Self-pay

## 2024-01-05 DIAGNOSIS — D649 Anemia, unspecified: Secondary | ICD-10-CM

## 2024-01-05 DIAGNOSIS — H2513 Age-related nuclear cataract, bilateral: Secondary | ICD-10-CM | POA: Diagnosis not present

## 2024-01-05 DIAGNOSIS — D75839 Thrombocytosis, unspecified: Secondary | ICD-10-CM

## 2024-01-05 LAB — URINALYSIS
Bilirubin Urine: NEGATIVE
Hgb urine dipstick: NEGATIVE
Ketones, ur: NEGATIVE
Leukocytes,Ua: NEGATIVE
Nitrite: NEGATIVE
Specific Gravity, Urine: 1.025 (ref 1.000–1.030)
Total Protein, Urine: NEGATIVE
Urine Glucose: >=1000 — AB
Urobilinogen, UA: 0.2 (ref 0.0–1.0)
pH: 5.5 (ref 5.0–8.0)

## 2024-01-05 LAB — CBC WITH DIFFERENTIAL/PLATELET
Basophils Absolute: 0.1 10*3/uL (ref 0.0–0.1)
Basophils Relative: 1.1 % (ref 0.0–3.0)
Eosinophils Absolute: 0.1 10*3/uL (ref 0.0–0.7)
Eosinophils Relative: 2.1 % (ref 0.0–5.0)
HCT: 34.5 % — ABNORMAL LOW (ref 39.0–52.0)
Hemoglobin: 11.2 g/dL — ABNORMAL LOW (ref 13.0–17.0)
Lymphocytes Relative: 18.4 % (ref 12.0–46.0)
Lymphs Abs: 1 10*3/uL (ref 0.7–4.0)
MCHC: 32.4 g/dL (ref 30.0–36.0)
MCV: 98.2 fl (ref 78.0–100.0)
Monocytes Absolute: 0.4 10*3/uL (ref 0.1–1.0)
Monocytes Relative: 6.7 % (ref 3.0–12.0)
Neutro Abs: 3.9 10*3/uL (ref 1.4–7.7)
Neutrophils Relative %: 71.7 % (ref 43.0–77.0)
Platelets: 455 10*3/uL — ABNORMAL HIGH (ref 150.0–400.0)
RBC: 3.52 Mil/uL — ABNORMAL LOW (ref 4.22–5.81)
RDW: 17.1 % — ABNORMAL HIGH (ref 11.5–15.5)
WBC: 5.4 10*3/uL (ref 4.0–10.5)

## 2024-01-05 LAB — TSH: TSH: 0.45 u[IU]/mL (ref 0.35–5.50)

## 2024-01-05 LAB — COMPREHENSIVE METABOLIC PANEL WITH GFR
ALT: 53 U/L (ref 0–53)
AST: 28 U/L (ref 0–37)
Albumin: 4 g/dL (ref 3.5–5.2)
Alkaline Phosphatase: 102 U/L (ref 39–117)
BUN: 11 mg/dL (ref 6–23)
CO2: 30 meq/L (ref 19–32)
Calcium: 9.2 mg/dL (ref 8.4–10.5)
Chloride: 104 meq/L (ref 96–112)
Creatinine, Ser: 1.07 mg/dL (ref 0.40–1.50)
GFR: 68.51 mL/min (ref >60.00)
Glucose, Bld: 83 mg/dL (ref 70–99)
Potassium: 4 meq/L (ref 3.5–5.1)
Sodium: 143 meq/L (ref 135–145)
Total Bilirubin: 0.3 mg/dL (ref 0.2–1.2)
Total Protein: 7.2 g/dL (ref 6.0–8.3)

## 2024-01-05 LAB — T4, FREE: Free T4: 0.93 ng/dL (ref 0.60–1.60)

## 2024-01-05 NOTE — Progress Notes (Signed)
 Thank you, Luke Shade, MD

## 2024-01-09 NOTE — Telephone Encounter (Signed)
 1. Normocytic normochromic anemia (Primary - CBC w/Diff; Future - B12 and Folate Panel; Future  2. Thrombocytosis - CBC w/Diff; Future  Repeat CBC, B12, folic acid  level in 1 week. Suspect reactive thrombocytosis and elevated ferritin. Already on Apixaban  5mg  BID.   Luke Shade, MD

## 2024-01-09 NOTE — Progress Notes (Signed)
 Please let the patient know recent lab results shows:  - normal calcium , sodium, parathyroid, thyroid  hormone. - urine looks significantly better, pending urine culture. We will reach out to him once culture result is back.  - anemia is significantly improved. Platelets, ferritin slightly elevated likely from recent illness which is improving. Recommend repeat CBC in 1 week. Lab ordered.   Thank you,  John Shade, MD

## 2024-01-12 ENCOUNTER — Other Ambulatory Visit

## 2024-01-12 ENCOUNTER — Telehealth (HOSPITAL_COMMUNITY): Payer: Self-pay

## 2024-01-12 ENCOUNTER — Other Ambulatory Visit (HOSPITAL_COMMUNITY): Payer: Self-pay

## 2024-01-12 LAB — CALCIUM, IONIZED: Calcium, Ion: 5 mg/dL (ref 4.7–5.5)

## 2024-01-12 LAB — IRON,TIBC AND FERRITIN PANEL
%SAT: 17 % — ABNORMAL LOW (ref 20–48)
Ferritin: 724 ng/mL — ABNORMAL HIGH (ref 24–380)
Iron: 52 ug/dL (ref 50–180)
TIBC: 307 ug/dL (ref 250–425)

## 2024-01-12 LAB — EXTRA SPECIMEN

## 2024-01-12 LAB — PTH, INTACT AND CALCIUM
Calcium: 9.1 mg/dL (ref 8.6–10.3)
PTH: 26 pg/mL (ref 16–77)

## 2024-01-12 LAB — URINE CULTURE

## 2024-01-12 NOTE — Progress Notes (Signed)
 Paramedicine Encounter    Patient ID: John Keith, male    DOB: 1950/09/21, 73 y.o.   MRN: [id number]   Complaints- weakness with low appetite   Assessment- CAOx4, warm and dry seated at kitchen table with complaints of low appetite and weakness- he says otherwise he is feeling fine. He denied dizziness, chest pain or shortness of breath. No lower leg swelling. Lungs clear. Vitals obtained- including orthostatics- normotensive on same.   Compliance with meds- no missed doses   Pill box filled- for two weeks   Refills needed- spiro  Meds changes since last visit- gabapentin  decreased to 2 capsules at bedtime      Social changes- none    VISIT SUMMARY- Arrived for home visit for John Keith who reports to be feeling weak and having low appetite. He denied any other complaints but states he just feels drained with little to no energy. He reports he is urinating well with light yellow urine. He denied any pain. No shortness of breath, chest pain or dizziness. Orthostatic vitals obtained. Normotensive vitals noted. I reviewed meds and confirmed same filling pill box for two weeks. Refills as noted. We reviewed education for HF and general health goals. He wants me to message his PCP about lack of appetite to see what medications she could offer or to see what he could do to help with same. I will follow up. Appointments reviewed and confirmed. Home visit complete. I will see John Keith in two weeks.   BP 102/60   Pulse 79   Resp 16   Wt 225 lb (102.1 kg)   SpO2 96%   BMI 24.11 kg/m  Weight yesterday-- didn't weigh  Last visit weight-- 230lbs      ACTION: Home visit completed     Patient Care Team: Abbey Bruckner, MD as PCP - General (Family Medicine) [name], MD as PCP - Electrophysiology (Cardiology) [name], [name], MD as PCP - Cardiology (Cardiology) [name], MD as Consulting Physician (Internal Medicine) [name], MD as Attending Physician  (Cardiology) [name], MD as Consulting Physician (Cardiology)  Patient Active Problem List   Diagnosis Date Noted   Hyponatremia 01/04/2024   Yeast cystitis 01/04/2024   Cardiac sarcoidosis 01/04/2024   Primary osteoarthritis 01/04/2024   Presence of heart assist device (HCC) 01/04/2024   Skin lesions 01/04/2024   Hypocalcemia 01/04/2024   Elevated serum free T4 level 01/04/2024   On anticoagulant therapy 08/29/2023   On amiodarone  therapy 08/29/2023   Onychomycosis 01/31/2023   Statin myopathy 11/15/2022   Encounter for screening for lung cancer 11/15/2022   Chronic systolic heart failure (HCC) 12/15/2021   Chronic combined systolic and diastolic CHF (congestive heart failure) (HCC) 12/15/2021   Ventricular tachycardia (HCC) 12/07/2021   Typical atrial flutter (HCC) 09/24/2021   Secondary hypercoagulable state (HCC) 09/24/2021   Abnormal MRI, lumbar spine 10/23/2020   Chronic low back pain 10/23/2020   Neuropathy 10/23/2020   Coronary artery disease of native artery of native heart with stable angina pectoris (HCC) 10/23/2020   Impingement syndrome of left shoulder region 08/22/2020   Pain in joint of left shoulder 07/24/2020   Chronic left shoulder pain 07/11/2020   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   Normocytic normochromic anemia 12/13/2019   PVC (premature ventricular contraction) 10/24/2019   Ventricular premature beats 10/24/2019   Gastric ulcer without hemorrhage or perforation 09/28/2019   Postherpetic neuralgia 08/17/2018   Benign prostatic hyperplasia 08/01/2018   Hip pain 06/29/2018   Aortic atherosclerosis (HCC)  09/22/2016   Lower urinary tract symptoms (LUTS) 06/03/2016   Lumbar radiculopathy 02/20/2016   Insomnia 06/10/2015   Health care maintenance 01/09/2014   Hyperlipidemia 11/09/2012   Vitamin D  deficiency 11/09/2012   Essential hypertension 11/06/2012   Seasonal allergies 11/06/2012   Erectile dysfunction 11/06/2012   GERD  (gastroesophageal reflux disease) 11/06/2012   Lactose intolerance 11/06/2012   Diverticulosis of colon without hemorrhage 11/06/2012   Degeneration of thoracolumbar intervertebral disc 11/06/2012    Current Outpatient Medications:    apixaban  (ELIQUIS ) 5 MG TABS tablet, TAKE 1 TABLET(5 MG) BY MOUTH TWICE DAILY, Disp: 60 tablet, Rfl: 11   calcium  carbonate (OS-CAL - DOSED IN MG OF ELEMENTAL CALCIUM ) 1250 (500 Ca) MG tablet, Take 1 tablet (500 mg of elemental calcium  total) by mouth daily with breakfast., Disp: 30 tablet, Rfl: 11   carvedilol  (COREG ) 6.25 MG tablet, Take 1 tablet (6.25 mg total) by mouth 2 (two) times daily with a meal., Disp: 180 tablet, Rfl: 3   Cholecalciferol  (VITAMIN D -3) 125 MCG (5000 UT) TABS, Take 1 tablet by mouth daily., Disp: 30 tablet, Rfl: 11   ezetimibe  (ZETIA ) 10 MG tablet, Take 1 tablet (10 mg total) by mouth daily., Disp: 90 tablet, Rfl: 3   FARXIGA  10 MG TABS tablet, Take 1 tablet (10 mg total) by mouth daily., Disp: 90 tablet, Rfl: 3   ferrous sulfate  325 (65 FE) MG tablet, Take 325 mg by mouth daily with breakfast., Disp: , Rfl:    folic acid  (FOLVITE ) 1 MG tablet, Take 1 tablet (1 mg total) by mouth daily., Disp: 30 tablet, Rfl: 6   gabapentin  (NEURONTIN ) 300 MG capsule, TAKE 3 CAPSULES(900 MG) BY MOUTH DAILY, Disp: 270 capsule, Rfl: 1   ketoconazole  (NIZORAL ) 2 % shampoo, Apply 1 Application topically 2 (two) times a week., Disp: 120 mL, Rfl: 1   methotrexate  (RHEUMATREX) 2.5 MG tablet, TAKE 20MG (8 TABLETS) EVERY WEDNESDAY. PROTECT FROM LIGHT AS DIRECTED, Disp: 150 tablet, Rfl: 0   Multiple Vitamin (MULTIVITAMIN) tablet, Take 1 tablet by mouth daily., Disp: , Rfl:    pantoprazole  (PROTONIX ) 40 MG tablet, Take 1 tablet (40 mg total) by mouth daily. 30 min before food, Disp: 30 tablet, Rfl: 6   sacubitril -valsartan  (ENTRESTO ) 24-26 MG, Take 1 tablet by mouth 2 (two) times daily., Disp: 180 tablet, Rfl: 3   spironolactone  (ALDACTONE ) 25 MG tablet, Take 1 tablet  (25 mg total) by mouth daily., Disp: 90 tablet, Rfl: 3   amiodarone  (PACERONE ) 200 MG tablet, , Disp: , Rfl:  Allergies  Allergen Reactions   Crestor  [Rosuvastatin  Calcium ] Other (See Comments)    Elevated muscle enzymes    Lipitor [Atorvastatin  Calcium ] Other (See Comments)    Elevated muscle enzymes      Social History   Socioeconomic History   Marital status: Married    Spouse name: Gwendolyn   Number of children: 2   Years of education: Not on file   Highest education level: High school graduate  Occupational History   Occupation: Retired    Comment: Semi  Tobacco Use   Smoking status: Former    Current packs/day: 0.00    Average packs/day: 0.5 packs/day for 49.0 years (24.5 ttl pk-yrs)    Types: Cigarettes    Start date: 07/06/1971    Quit date: 06/26/2020    Years since quitting: 3.5   Smokeless tobacco: Never   Tobacco comments:    Former smoker 09/24/21  Vaping Use   Vaping status: Never Used  Substance and Sexual  Activity   Alcohol use: No    Alcohol/week: 0.0 standard drinks of alcohol   Drug use: No   Sexual activity: Not on file  Other Topics Concern   Not on file  Social History Narrative   Married    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    On child    1 son, 1 daughter   Machine op   Owns guns    Wears seat belt, safe in relationship    Smoker    Retired 06/27/2019   Social Drivers of Health   Financial Resource Strain: Low Risk  (11/22/2022)   Overall Financial Resource Strain (CARDIA)    Difficulty of Paying Living Expenses: Not hard at all  Food Insecurity: No Food Insecurity (12/26/2023)   Hunger Vital Sign    Worried About Running Out of Food in the Last Year: Never true    Ran Out of Food in the Last Year: Never true  Transportation Needs: No Transportation Needs (12/26/2023)   PRAPARE - Administrator, Civil Service (Medical): No    Lack of Transportation (Non-Medical): No  Physical Activity: Sufficiently Active (11/22/2022)   Exercise Vital  Sign    Days of Exercise per Week: 3 days    Minutes of Exercise per Session: 60 min  Stress: No Stress Concern Present (11/22/2022)   [employer] of Occupational Health - Occupational Stress Questionnaire    Feeling of Stress : Not at all  Social Connections: Patient Declined (12/18/2023)   Social Connection and Isolation Panel    Frequency of Communication with Friends and Family: Patient declined    Frequency of Social Gatherings with Friends and Family: Patient declined    Attends Religious Services: Patient declined    Active Member of Clubs or Organizations: Patient declined    Attends Banker Meetings: Patient declined    Marital Status: Patient declined  Intimate Partner Violence: Not At Risk (12/26/2023)   Humiliation, Afraid, Rape, and Kick questionnaire    Fear of Current or Ex-Partner: No    Emotionally Abused: No    Physically Abused: No    Sexually Abused: No    Physical Exam      Future Appointments  Date Time Provider Department Center  01/19/2024 10:30 AM [facility]  02/23/2024  9:15 AM McCaughan, Dia D, DPM [facility]  03/09/2024  7:00 AM [facility] DEVICE REMOTES [facility]  04/05/2024  8:00 AM Bair, Kalpana, MD [facility]  06/08/2024  7:00 AM [facility] DEVICE REMOTES [facility]

## 2024-01-12 NOTE — Telephone Encounter (Signed)
 Saw John Keith in the home today for our paramedicine visit and he asked if I would relay a message to his PCP about having lack of energy and appetite. He asked if there were any medication to help with this. I told him I would message PCP and have her advise. He agreed. I will follow up.   [name], EMT-Paramedic [phone] 01/12/2024

## 2024-01-17 ENCOUNTER — Other Ambulatory Visit (HOSPITAL_COMMUNITY): Payer: Self-pay

## 2024-01-17 ENCOUNTER — Other Ambulatory Visit (HOSPITAL_COMMUNITY): Payer: Self-pay | Admitting: Family Medicine

## 2024-01-19 ENCOUNTER — Other Ambulatory Visit (INDEPENDENT_AMBULATORY_CARE_PROVIDER_SITE_OTHER)

## 2024-01-19 DIAGNOSIS — D649 Anemia, unspecified: Secondary | ICD-10-CM

## 2024-01-19 DIAGNOSIS — D75839 Thrombocytosis, unspecified: Secondary | ICD-10-CM | POA: Diagnosis not present

## 2024-01-19 LAB — B12 AND FOLATE PANEL
Folate: >23.4 ng/mL (ref >5.9)
Vitamin B-12: 848 pg/mL (ref 211–911)

## 2024-01-19 LAB — CBC WITH DIFFERENTIAL/PLATELET
Basophils Absolute: 0 K/uL (ref 0.0–0.1)
Basophils Relative: 0.7 % (ref 0.0–3.0)
Eosinophils Absolute: 0.1 K/uL (ref 0.0–0.7)
Eosinophils Relative: 1.8 % (ref 0.0–5.0)
HCT: 36.6 % — ABNORMAL LOW (ref 39.0–52.0)
Hemoglobin: 11.6 g/dL — ABNORMAL LOW (ref 13.0–17.0)
Lymphocytes Relative: 27.3 % (ref 12.0–46.0)
Lymphs Abs: 1.2 K/uL (ref 0.7–4.0)
MCHC: 31.7 g/dL (ref 30.0–36.0)
MCV: 99.2 fl (ref 78.0–100.0)
Monocytes Absolute: 0.4 K/uL (ref 0.1–1.0)
Monocytes Relative: 8.3 % (ref 3.0–12.0)
Neutro Abs: 2.8 K/uL (ref 1.4–7.7)
Neutrophils Relative %: 61.9 % (ref 43.0–77.0)
Platelets: 393 K/uL (ref 150.0–400.0)
RBC: 3.69 Mil/uL — ABNORMAL LOW (ref 4.22–5.81)
RDW: 17.8 % — ABNORMAL HIGH (ref 11.5–15.5)
WBC: 4.5 K/uL (ref 4.0–10.5)

## 2024-01-23 ENCOUNTER — Ambulatory Visit: Payer: Self-pay

## 2024-01-23 NOTE — Progress Notes (Signed)
 Please call the patient and let him know (Last mychart login (12/21/23):  his recent labs shows stable hemoglobin, normal B12, normal platelets. I recommend eating nutrition dense food eg- beef, chicken, fish, lentils, beans, eggs, spinach, kale and reducing intake of processed foods.   Thank you,  Luke Shade, MD

## 2024-01-24 NOTE — Addendum Note (Signed)
 Addended by: VICCI SELLER A on: 01/24/2024 10:04 AM   Modules accepted: Orders

## 2024-01-24 NOTE — Progress Notes (Signed)
 Remote ICD transmission.

## 2024-01-25 ENCOUNTER — Other Ambulatory Visit (HOSPITAL_COMMUNITY): Payer: Self-pay

## 2024-01-26 ENCOUNTER — Other Ambulatory Visit (HOSPITAL_COMMUNITY): Payer: Self-pay

## 2024-01-26 NOTE — Progress Notes (Unsigned)
 Paramedicine Encounter    Patient ID: John Keith, male    DOB: July 04, 1951, 73 y.o.   MRN: [id number]   Complaints- NONE   Assessment- CAOx4, warm and dry reporting to be feeling well with no complaints. Says his appetite is better and he has more energy. Vitals obtained as noted. Lungs clear. No edema. No abdominal distention. He reports urinating and moving his bowels well.   Compliance with meds- no missed doses   Pill box filled- for two weeks   Refills needed- eliquis , farxiga    Meds changes since last visit- none     Social changes- none    VISIT SUMMARY- Arrived for home visit for [name] who reports to be feeling well with no complaints. He reports his energy is better and his appetite has improved. He denied any shortness of breath, dizziness, chest pain, swelling. Lungs clear. No edema. Vitals within normal for him. No med changes recently. I reviewed meds and confirmed same. Pill box filled for two weeks. We reviewed HF education and appointments. Home visit complete.   BP 108/60   Pulse 84   Resp 16   Wt 229 lb 9.6 oz (104.1 kg)   SpO2 96%   BMI 24.60 kg/m  Weight yesterday-- 228lbs  Last visit weight-- 225lbs      ACTION: Home visit completed     Patient Care Team: Abbey Bruckner, MD as PCP - General (Family Medicine) [name], MD as PCP - Electrophysiology (Cardiology) [name], [name], MD as PCP - Cardiology (Cardiology) [name], MD as Consulting Physician (Internal Medicine) [name], MD as Attending Physician (Cardiology) [name], MD as Consulting Physician (Cardiology)  Patient Active Problem List   Diagnosis Date Noted   Hyponatremia 01/04/2024   Yeast cystitis 01/04/2024   Cardiac sarcoidosis 01/04/2024   Primary osteoarthritis 01/04/2024   Presence of heart assist device (HCC) 01/04/2024   Skin lesions 01/04/2024   Hypocalcemia 01/04/2024   Elevated serum free T4 level 01/04/2024   On  anticoagulant therapy 08/29/2023   On amiodarone  therapy 08/29/2023   Onychomycosis 01/31/2023   Statin myopathy 11/15/2022   Encounter for screening for lung cancer 11/15/2022   Chronic systolic heart failure (HCC) 12/15/2021   Chronic combined systolic and diastolic CHF (congestive heart failure) (HCC) 12/15/2021   Ventricular tachycardia (HCC) 12/07/2021   Typical atrial flutter (HCC) 09/24/2021   Secondary hypercoagulable state (HCC) 09/24/2021   Abnormal MRI, lumbar spine 10/23/2020   Chronic low back pain 10/23/2020   Neuropathy 10/23/2020   Coronary artery disease of native artery of native heart with stable angina pectoris (HCC) 10/23/2020   Impingement syndrome of left shoulder region 08/22/2020   Pain in joint of left shoulder 07/24/2020   Chronic left shoulder pain 07/11/2020   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   Normocytic normochromic anemia 12/13/2019   PVC (premature ventricular contraction) 10/24/2019   Ventricular premature beats 10/24/2019   Gastric ulcer without hemorrhage or perforation 09/28/2019   Postherpetic neuralgia 08/17/2018   Benign prostatic hyperplasia 08/01/2018   Hip pain 06/29/2018   Aortic atherosclerosis (HCC) 09/22/2016   Lower urinary tract symptoms (LUTS) 06/03/2016   Lumbar radiculopathy 02/20/2016   Insomnia 06/10/2015   Health care maintenance 01/09/2014   Hyperlipidemia 11/09/2012   Vitamin D  deficiency 11/09/2012   Essential hypertension 11/06/2012   Seasonal allergies 11/06/2012   Erectile dysfunction 11/06/2012   GERD (gastroesophageal reflux disease) 11/06/2012   Lactose intolerance 11/06/2012   Diverticulosis of colon without hemorrhage 11/06/2012  Degeneration of thoracolumbar intervertebral disc 11/06/2012    Current Outpatient Medications:    apixaban  (ELIQUIS ) 5 MG TABS tablet, TAKE 1 TABLET(5 MG) BY MOUTH TWICE DAILY, Disp: 60 tablet, Rfl: 11   calcium  carbonate (OS-CAL - DOSED IN MG OF ELEMENTAL CALCIUM ) 1250 (500 Ca) MG  tablet, Take 1 tablet (500 mg of elemental calcium  total) by mouth daily with breakfast., Disp: 30 tablet, Rfl: 11   carvedilol  (COREG ) 6.25 MG tablet, TAKE 1 TABLET(6.25 MG) BY MOUTH TWICE DAILY WITH A MEAL, Disp: 180 tablet, Rfl: 3   Cholecalciferol  (VITAMIN D -3) 125 MCG (5000 UT) TABS, Take 1 tablet by mouth daily., Disp: 30 tablet, Rfl: 11   ezetimibe  (ZETIA ) 10 MG tablet, Take 1 tablet (10 mg total) by mouth daily., Disp: 90 tablet, Rfl: 3   FARXIGA  10 MG TABS tablet, Take 1 tablet (10 mg total) by mouth daily., Disp: 90 tablet, Rfl: 3   ferrous sulfate  325 (65 FE) MG tablet, Take 325 mg by mouth daily with breakfast., Disp: , Rfl:    folic acid  (FOLVITE ) 1 MG tablet, Take 1 tablet (1 mg total) by mouth daily., Disp: 30 tablet, Rfl: 6   gabapentin  (NEURONTIN ) 300 MG capsule, TAKE 3 CAPSULES(900 MG) BY MOUTH DAILY, Disp: 270 capsule, Rfl: 1   ketoconazole  (NIZORAL ) 2 % shampoo, Apply 1 Application topically 2 (two) times a week., Disp: 120 mL, Rfl: 1   methotrexate  (RHEUMATREX) 2.5 MG tablet, TAKE 20MG (8 TABLETS) EVERY WEDNESDAY. PROTECT FROM LIGHT AS DIRECTED, Disp: 150 tablet, Rfl: 0   Multiple Vitamin (MULTIVITAMIN) tablet, Take 1 tablet by mouth daily., Disp: , Rfl:    pantoprazole  (PROTONIX ) 40 MG tablet, Take 1 tablet (40 mg total) by mouth daily. 30 min before food, Disp: 30 tablet, Rfl: 6   sacubitril -valsartan  (ENTRESTO ) 24-26 MG, Take 1 tablet by mouth 2 (two) times daily., Disp: 180 tablet, Rfl: 3   spironolactone  (ALDACTONE ) 25 MG tablet, Take 1 tablet (25 mg total) by mouth daily., Disp: 90 tablet, Rfl: 3   amiodarone  (PACERONE ) 200 MG tablet, , Disp: , Rfl:  Allergies  Allergen Reactions   Crestor  [Rosuvastatin  Calcium ] Other (See Comments)    Elevated muscle enzymes    Lipitor [Atorvastatin  Calcium ] Other (See Comments)    Elevated muscle enzymes      Social History   Socioeconomic History   Marital status: Married    Spouse name: John Keith   Number of children: 2    Years of education: Not on file   Highest education level: High school graduate  Occupational History   Occupation: Retired    Comment: Semi  Tobacco Use   Smoking status: Former    Current packs/day: 0.00    Average packs/day: 0.5 packs/day for 49.0 years (24.5 ttl pk-yrs)    Types: Cigarettes    Start date: 07/06/1971    Quit date: 06/26/2020    Years since quitting: 3.5   Smokeless tobacco: Never   Tobacco comments:    Former smoker 09/24/21  Vaping Use   Vaping status: Never Used  Substance and Sexual Activity   Alcohol use: No    Alcohol/week: 0.0 standard drinks of alcohol   Drug use: No   Sexual activity: Not on file  Other Topics Concern   Not on file  Social History Narrative   Married    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    On child    1 son, 1 daughter   Systems analyst op   Owns guns    Wears seat belt,  safe in relationship    Smoker    Retired 06/27/2019   Social Drivers of [employer]: Low Risk  (11/22/2022)   Overall Financial Resource Strain (CARDIA)    Difficulty of Paying Living Expenses: Not hard at all  Food Insecurity: No Food Insecurity (12/26/2023)   Hunger Vital Sign    Worried About Running Out of Food in the Last Year: Never true    Ran Out of Food in the Last Year: Never true  Transportation Needs: No Transportation Needs (12/26/2023)   PRAPARE - Administrator, Civil Service (Medical): No    Lack of Transportation (Non-Medical): No  Physical Activity: Sufficiently Active (11/22/2022)   Exercise Vital Sign    Days of Exercise per Week: 3 days    Minutes of Exercise per Session: 60 min  Stress: No Stress Concern Present (11/22/2022)   [employer] of Occupational Health - Occupational Stress Questionnaire    Feeling of Stress : Not at all  Social Connections: Patient Declined (12/18/2023)   Social Connection and Isolation Panel    Frequency of Communication with Friends and Family: Patient declined    Frequency of Social  Gatherings with Friends and Family: Patient declined    Attends Religious Services: Patient declined    Active Member of Clubs or Organizations: Patient declined    Attends Banker Meetings: Patient declined    Marital Status: Patient declined  Intimate Partner Violence: Not At Risk (12/26/2023)   Humiliation, Afraid, Rape, and Kick questionnaire    Fear of Current or Ex-Partner: No    Emotionally Abused: No    Physically Abused: No    Sexually Abused: No    Physical Exam      Future Appointments  Date Time Provider Department Center  02/23/2024  9:15 AM McCaughan, Dia D, DPM [facility]  03/09/2024  7:00 AM [facility] DEVICE REMOTES [facility]  04/05/2024  8:00 AM Bair, Kalpana, MD [facility]  06/08/2024  7:00 AM [facility] DEVICE REMOTES [facility]

## 2024-01-27 ENCOUNTER — Other Ambulatory Visit (HOSPITAL_COMMUNITY): Payer: Self-pay

## 2024-02-02 ENCOUNTER — Other Ambulatory Visit (HOSPITAL_COMMUNITY): Payer: Self-pay | Admitting: Cardiology

## 2024-02-02 DIAGNOSIS — E785 Hyperlipidemia, unspecified: Secondary | ICD-10-CM

## 2024-02-02 MED ORDER — EZETIMIBE 10 MG PO TABS: 10.0000 mg | ORAL_TABLET | Freq: Every day | ORAL | 3 refills | Status: AC

## 2024-02-09 ENCOUNTER — Other Ambulatory Visit (HOSPITAL_COMMUNITY): Payer: Self-pay

## 2024-02-10 NOTE — Progress Notes (Signed)
 Paramedicine Encounter    Patient ID: John Keith, male    DOB: 04-Apr-1951, 73 y.o.   MRN: [id number]   Complaints- neck pain for several weeks- muscle related using OTC ointments and heating pad   Assessment- CAOX4, warm and dry, ambulatory without complaints or shortness of breath. Lungs clear, vitals within normal, no swelling or edema.   Compliance with meds no missed doses   Pill box filled- for one week   Refills needed- entresto , zetia , methotrexate    Meds changes since last visit- none     Social changes- none    VISIT SUMMARY- Arrived for home visit for John Keith who reports to be feeling well complaining of neck pain which endorses muscle related pain. He is using salves, and heating pad which helps along with tylenol . I advised him to try using ice packs and if no relief to reach out to PCP for muscle relaxer. He agreed with plan. Vitals as noted within normal. Lungs clear. No edema or swelling noted. He is urinating well with no discolor or odor. He is passing bowel movements normally with no bleeding. I reviewed meds and confirmed same filled pill box for one week. Refills will be called in. He reports he has not received his reimbursement from [facility]. I will reach out to pharm advocate and follow up. Appointments reviewed and confirmed. Home visit complete.   BP 100/62   Pulse 77   Resp 16   Wt 231 lb (104.8 kg)   SpO2 94%   BMI 24.75 kg/m  Weight yesterday-230lbs  Last visit weight--229lbs      ACTION: Home visit completed     Patient Care Team: Bair, Kalpana, MD as PCP - General (Family Medicine) [name], MD as PCP - Electrophysiology (Cardiology) [name], [name], MD as PCP - Cardiology (Cardiology) [name], MD as Consulting Physician (Internal Medicine) [name], MD as Attending Physician (Cardiology) [name], MD as Consulting Physician (Cardiology)  Patient Active Problem List   Diagnosis Date Noted    Hyponatremia 01/04/2024   Yeast cystitis 01/04/2024   Cardiac sarcoidosis 01/04/2024   Primary osteoarthritis 01/04/2024   Presence of heart assist device (HCC) 01/04/2024   Skin lesions 01/04/2024   Hypocalcemia 01/04/2024   Elevated serum free T4 level 01/04/2024   On anticoagulant therapy 08/29/2023   On amiodarone  therapy 08/29/2023   Onychomycosis 01/31/2023   Statin myopathy 11/15/2022   Encounter for screening for lung cancer 11/15/2022   Chronic systolic heart failure (HCC) 12/15/2021   Chronic combined systolic and diastolic CHF (congestive heart failure) (HCC) 12/15/2021   Ventricular tachycardia (HCC) 12/07/2021   Typical atrial flutter (HCC) 09/24/2021   Secondary hypercoagulable state (HCC) 09/24/2021   Abnormal MRI, lumbar spine 10/23/2020   Chronic low back pain 10/23/2020   Neuropathy 10/23/2020   Coronary artery disease of native artery of native heart with stable angina pectoris (HCC) 10/23/2020   Impingement syndrome of left shoulder region 08/22/2020   Pain in joint of left shoulder 07/24/2020   Chronic left shoulder pain 07/11/2020   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   Normocytic normochromic anemia 12/13/2019   PVC (premature ventricular contraction) 10/24/2019   Ventricular premature beats 10/24/2019   Gastric ulcer without hemorrhage or perforation 09/28/2019   Postherpetic neuralgia 08/17/2018   Benign prostatic hyperplasia 08/01/2018   Hip pain 06/29/2018   Aortic atherosclerosis (HCC) 09/22/2016   Lower urinary tract symptoms (LUTS) 06/03/2016   Lumbar radiculopathy 02/20/2016   Insomnia 06/10/2015   Health  care maintenance 01/09/2014   Hyperlipidemia 11/09/2012   Vitamin D  deficiency 11/09/2012   Essential hypertension 11/06/2012   Seasonal allergies 11/06/2012   Erectile dysfunction 11/06/2012   GERD (gastroesophageal reflux disease) 11/06/2012   Lactose intolerance 11/06/2012   Diverticulosis of colon without hemorrhage 11/06/2012    Degeneration of thoracolumbar intervertebral disc 11/06/2012    Current Outpatient Medications:    amiodarone  (PACERONE ) 200 MG tablet, , Disp: , Rfl:    apixaban  (ELIQUIS ) 5 MG TABS tablet, TAKE 1 TABLET(5 MG) BY MOUTH TWICE DAILY, Disp: 60 tablet, Rfl: 11   calcium  carbonate (OS-CAL - DOSED IN MG OF ELEMENTAL CALCIUM ) 1250 (500 Ca) MG tablet, Take 1 tablet (500 mg of elemental calcium  total) by mouth daily with breakfast., Disp: 30 tablet, Rfl: 11   carvedilol  (COREG ) 6.25 MG tablet, TAKE 1 TABLET(6.25 MG) BY MOUTH TWICE DAILY WITH A MEAL, Disp: 180 tablet, Rfl: 3   Cholecalciferol  (VITAMIN D -3) 125 MCG (5000 UT) TABS, Take 1 tablet by mouth daily., Disp: 30 tablet, Rfl: 11   ezetimibe  (ZETIA ) 10 MG tablet, Take 1 tablet (10 mg total) by mouth daily., Disp: 90 tablet, Rfl: 3   FARXIGA  10 MG TABS tablet, Take 1 tablet (10 mg total) by mouth daily., Disp: 90 tablet, Rfl: 3   ferrous sulfate  325 (65 FE) MG tablet, Take 325 mg by mouth daily with breakfast., Disp: , Rfl:    folic acid  (FOLVITE ) 1 MG tablet, Take 1 tablet (1 mg total) by mouth daily., Disp: 30 tablet, Rfl: 6   gabapentin  (NEURONTIN ) 300 MG capsule, TAKE 3 CAPSULES(900 MG) BY MOUTH DAILY, Disp: 270 capsule, Rfl: 1   ketoconazole  (NIZORAL ) 2 % shampoo, Apply 1 Application topically 2 (two) times a week., Disp: 120 mL, Rfl: 1   methotrexate  (RHEUMATREX) 2.5 MG tablet, TAKE 20MG (8 TABLETS) EVERY WEDNESDAY. PROTECT FROM LIGHT AS DIRECTED, Disp: 150 tablet, Rfl: 0   Multiple Vitamin (MULTIVITAMIN) tablet, Take 1 tablet by mouth daily., Disp: , Rfl:    pantoprazole  (PROTONIX ) 40 MG tablet, Take 1 tablet (40 mg total) by mouth daily. 30 min before food, Disp: 30 tablet, Rfl: 6   sacubitril -valsartan  (ENTRESTO ) 24-26 MG, Take 1 tablet by mouth 2 (two) times daily., Disp: 180 tablet, Rfl: 3   spironolactone  (ALDACTONE ) 25 MG tablet, Take 1 tablet (25 mg total) by mouth daily., Disp: 90 tablet, Rfl: 3 Allergies  Allergen Reactions   Crestor   [Rosuvastatin  Calcium ] Other (See Comments)    Elevated muscle enzymes    Lipitor [Atorvastatin  Calcium ] Other (See Comments)    Elevated muscle enzymes      Social History   Socioeconomic History   Marital status: Married    Spouse name: Gwendolyn   Number of children: 2   Years of education: Not on file   Highest education level: High school graduate  Occupational History   Occupation: Retired    Comment: Semi  Tobacco Use   Smoking status: Former    Current packs/day: 0.00    Average packs/day: 0.5 packs/day for 49.0 years (24.5 ttl pk-yrs)    Types: Cigarettes    Start date: 07/06/1971    Quit date: 06/26/2020    Years since quitting: 3.6   Smokeless tobacco: Never   Tobacco comments:    Former smoker 09/24/21  Vaping Use   Vaping status: Never Used  Substance and Sexual Activity   Alcohol use: No    Alcohol/week: 0.0 standard drinks of alcohol   Drug use: No   Sexual activity: Not  on file  Other Topics Concern   Not on file  Social History Narrative   Married    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    On child    1 son, 1 daughter   Machine op   Owns guns    Wears seat belt, safe in relationship    Smoker    Retired 06/27/2019   Social Drivers of Health   Financial Resource Strain: Low Risk  (11/22/2022)   Overall Financial Resource Strain (CARDIA)    Difficulty of Paying Living Expenses: Not hard at all  Food Insecurity: No Food Insecurity (12/26/2023)   Hunger Vital Sign    Worried About Running Out of Food in the Last Year: Never true    Ran Out of Food in the Last Year: Never true  Transportation Needs: No Transportation Needs (12/26/2023)   PRAPARE - Administrator, Civil Service (Medical): No    Lack of Transportation (Non-Medical): No  Physical Activity: Sufficiently Active (11/22/2022)   Exercise Vital Sign    Days of Exercise per Week: 3 days    Minutes of Exercise per Session: 60 min  Stress: No Stress Concern Present (11/22/2022)   [employer]  of Occupational Health - Occupational Stress Questionnaire    Feeling of Stress : Not at all  Social Connections: Patient Declined (12/18/2023)   Social Connection and Isolation Panel    Frequency of Communication with Friends and Family: Patient declined    Frequency of Social Gatherings with Friends and Family: Patient declined    Attends Religious Services: Patient declined    Active Member of Clubs or Organizations: Patient declined    Attends Banker Meetings: Patient declined    Marital Status: Patient declined  Intimate Partner Violence: Not At Risk (12/26/2023)   Humiliation, Afraid, Rape, and Kick questionnaire    Fear of Current or Ex-Partner: No    Emotionally Abused: No    Physically Abused: No    Sexually Abused: No    Physical Exam      Future Appointments  Date Time Provider Department Center  02/23/2024  9:15 AM McCaughan, Dia D, DPM [facility]  03/09/2024  7:00 AM [facility] DEVICE REMOTES [facility]  04/05/2024  8:00 AM Bair, Kalpana, MD [facility]  06/08/2024  7:00 AM [facility] DEVICE REMOTES [facility]

## 2024-02-13 ENCOUNTER — Other Ambulatory Visit (HOSPITAL_COMMUNITY): Payer: Self-pay

## 2024-02-16 ENCOUNTER — Other Ambulatory Visit (HOSPITAL_COMMUNITY): Payer: Self-pay

## 2024-02-16 NOTE — Progress Notes (Signed)
 Paramedicine Encounter    Patient ID: John Keith, male    DOB: April 08, 1951, 73 y.o.   MRN: [id number]   Complaints-none   Assessment- CAOX4, warm and dry seated at her kitchen table with no complaints today. Lungs clear, vitals obtained and within normal. No edema. He reports having no chest pain, no dizziness, no shortness of breath.   Compliance with meds- no missed doses   Pill box filled- for two weeks   Refills needed- zetia    Meds changes since last visit- none     Social changes- none    VISIT SUMMARY- Arrived for home visit for John Keith who reports to be feeling well with no complaints today. He denied shortness of breath, dizziness and or chest pain. No swelling noted. Lungs clear. Vitals and assessment as noted. Meds reviewed and confirmed. Pill box filled for two weeks. Refills for zetia  called into [facility]. We reviewed upcoming appointments. HF education discussed and provided. Home visit complete. I will see him in two weeks.   BP 110/64   Pulse 80   Resp 16   Wt 233 lb 12.8 oz (106.1 kg)   SpO2 96%   BMI 25.05 kg/m  Weight yesterday-- 232lbs  Last visit weight-- 231lbs      ACTION: Home visit completed     Patient Care Team: Abbey Bruckner, MD as PCP - General (Family Medicine) [name], MD as PCP - Electrophysiology (Cardiology) [name], [name], MD as PCP - Cardiology (Cardiology) [name], MD as Consulting Physician (Internal Medicine) [name], MD as Attending Physician (Cardiology) [name], MD as Consulting Physician (Cardiology)  Patient Active Problem List   Diagnosis Date Noted   Hyponatremia 01/04/2024   Yeast cystitis 01/04/2024   Cardiac sarcoidosis 01/04/2024   Primary osteoarthritis 01/04/2024   Presence of heart assist device (HCC) 01/04/2024   Skin lesions 01/04/2024   Hypocalcemia 01/04/2024   Elevated serum free T4 level 01/04/2024   On anticoagulant therapy 08/29/2023   On  amiodarone  therapy 08/29/2023   Onychomycosis 01/31/2023   Statin myopathy 11/15/2022   Encounter for screening for lung cancer 11/15/2022   Chronic systolic heart failure (HCC) 12/15/2021   Chronic combined systolic and diastolic CHF (congestive heart failure) (HCC) 12/15/2021   Ventricular tachycardia (HCC) 12/07/2021   Typical atrial flutter (HCC) 09/24/2021   Secondary hypercoagulable state (HCC) 09/24/2021   Abnormal MRI, lumbar spine 10/23/2020   Chronic low back pain 10/23/2020   Neuropathy 10/23/2020   Coronary artery disease of native artery of native heart with stable angina pectoris (HCC) 10/23/2020   Impingement syndrome of left shoulder region 08/22/2020   Pain in joint of left shoulder 07/24/2020   Chronic left shoulder pain 07/11/2020   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   Normocytic normochromic anemia 12/13/2019   PVC (premature ventricular contraction) 10/24/2019   Ventricular premature beats 10/24/2019   Gastric ulcer without hemorrhage or perforation 09/28/2019   Postherpetic neuralgia 08/17/2018   Benign prostatic hyperplasia 08/01/2018   Hip pain 06/29/2018   Aortic atherosclerosis (HCC) 09/22/2016   Lower urinary tract symptoms (LUTS) 06/03/2016   Lumbar radiculopathy 02/20/2016   Insomnia 06/10/2015   Health care maintenance 01/09/2014   Hyperlipidemia 11/09/2012   Vitamin D  deficiency 11/09/2012   Essential hypertension 11/06/2012   Seasonal allergies 11/06/2012   Erectile dysfunction 11/06/2012   GERD (gastroesophageal reflux disease) 11/06/2012   Lactose intolerance 11/06/2012   Diverticulosis of colon without hemorrhage 11/06/2012   Degeneration of thoracolumbar intervertebral disc 11/06/2012  Current Outpatient Medications:    apixaban  (ELIQUIS ) 5 MG TABS tablet, TAKE 1 TABLET(5 MG) BY MOUTH TWICE DAILY, Disp: 60 tablet, Rfl: 11   calcium  carbonate (OS-CAL - DOSED IN MG OF ELEMENTAL CALCIUM ) 1250 (500 Ca) MG tablet, Take 1 tablet (500 mg of  elemental calcium  total) by mouth daily with breakfast., Disp: 30 tablet, Rfl: 11   carvedilol  (COREG ) 6.25 MG tablet, TAKE 1 TABLET(6.25 MG) BY MOUTH TWICE DAILY WITH A MEAL, Disp: 180 tablet, Rfl: 3   Cholecalciferol  (VITAMIN D -3) 125 MCG (5000 UT) TABS, Take 1 tablet by mouth daily., Disp: 30 tablet, Rfl: 11   ezetimibe  (ZETIA ) 10 MG tablet, Take 1 tablet (10 mg total) by mouth daily., Disp: 90 tablet, Rfl: 3   FARXIGA  10 MG TABS tablet, Take 1 tablet (10 mg total) by mouth daily., Disp: 90 tablet, Rfl: 3   ferrous sulfate  325 (65 FE) MG tablet, Take 325 mg by mouth daily with breakfast., Disp: , Rfl:    folic acid  (FOLVITE ) 1 MG tablet, Take 1 tablet (1 mg total) by mouth daily., Disp: 30 tablet, Rfl: 6   gabapentin  (NEURONTIN ) 300 MG capsule, TAKE 3 CAPSULES(900 MG) BY MOUTH DAILY, Disp: 270 capsule, Rfl: 1   ketoconazole  (NIZORAL ) 2 % shampoo, Apply 1 Application topically 2 (two) times a week., Disp: 120 mL, Rfl: 1   methotrexate  (RHEUMATREX) 2.5 MG tablet, TAKE 20MG (8 TABLETS) EVERY WEDNESDAY. PROTECT FROM LIGHT AS DIRECTED, Disp: 150 tablet, Rfl: 0   Multiple Vitamin (MULTIVITAMIN) tablet, Take 1 tablet by mouth daily., Disp: , Rfl:    pantoprazole  (PROTONIX ) 40 MG tablet, Take 1 tablet (40 mg total) by mouth daily. 30 min before food, Disp: 30 tablet, Rfl: 6   sacubitril -valsartan  (ENTRESTO ) 24-26 MG, Take 1 tablet by mouth 2 (two) times daily., Disp: 180 tablet, Rfl: 3   spironolactone  (ALDACTONE ) 25 MG tablet, Take 1 tablet (25 mg total) by mouth daily., Disp: 90 tablet, Rfl: 3   amiodarone  (PACERONE ) 200 MG tablet, , Disp: , Rfl:  Allergies  Allergen Reactions   Crestor  [Rosuvastatin  Calcium ] Other (See Comments)    Elevated muscle enzymes    Lipitor [Atorvastatin  Calcium ] Other (See Comments)    Elevated muscle enzymes      Social History   Socioeconomic History   Marital status: Married    Spouse name: John Keith   Number of children: 2   Years of education: Not on file    Highest education level: High school graduate  Occupational History   Occupation: Retired    Comment: Semi  Tobacco Use   Smoking status: Former    Current packs/day: 0.00    Average packs/day: 0.5 packs/day for 49.0 years (24.5 ttl pk-yrs)    Types: Cigarettes    Start date: 07/06/1971    Quit date: 06/26/2020    Years since quitting: 3.6   Smokeless tobacco: Never   Tobacco comments:    Former smoker 09/24/21  Vaping Use   Vaping status: Never Used  Substance and Sexual Activity   Alcohol use: No    Alcohol/week: 0.0 standard drinks of alcohol   Drug use: No   Sexual activity: Not on file  Other Topics Concern   Not on file  Social History Narrative   Married    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    On child    1 son, 1 daughter   Systems analyst op   Owns guns    Wears seat belt, safe in relationship    Smoker  Retired 06/27/2019   Social Drivers of Corporate investment banker Strain: Low Risk  (11/22/2022)   Overall Financial Resource Strain (CARDIA)    Difficulty of Paying Living Expenses: Not hard at all  Food Insecurity: No Food Insecurity (12/26/2023)   Hunger Vital Sign    Worried About Running Out of Food in the Last Year: Never true    Ran Out of Food in the Last Year: Never true  Transportation Needs: No Transportation Needs (12/26/2023)   PRAPARE - Administrator, Civil Service (Medical): No    Lack of Transportation (Non-Medical): No  Physical Activity: Sufficiently Active (11/22/2022)   Exercise Vital Sign    Days of Exercise per Week: 3 days    Minutes of Exercise per Session: 60 min  Stress: No Stress Concern Present (11/22/2022)   [employer] of Occupational Health - Occupational Stress Questionnaire    Feeling of Stress : Not at all  Social Connections: Patient Declined (12/18/2023)   Social Connection and Isolation Panel    Frequency of Communication with Friends and Family: Patient declined    Frequency of Social Gatherings with Friends and Family:  Patient declined    Attends Religious Services: Patient declined    Active Member of Clubs or Organizations: Patient declined    Attends Banker Meetings: Patient declined    Marital Status: Patient declined  Intimate Partner Violence: Not At Risk (12/26/2023)   Humiliation, Afraid, Rape, and Kick questionnaire    Fear of Current or Ex-Partner: No    Emotionally Abused: No    Physically Abused: No    Sexually Abused: No    Physical Exam      Future Appointments  Date Time Provider Department Center  02/23/2024  9:15 AM McCaughan, Dia D, DPM [facility]  03/09/2024  7:00 AM [facility] H[facility] DEVICE REMOTES [facility]  04/05/2024  8:00 AM Bair, Kalpana, MD [facility]  06/08/2024  7:00 AM [facility] DEVICE REMOTES [facility]

## 2024-02-19 DIAGNOSIS — M1990 Unspecified osteoarthritis, unspecified site: Secondary | ICD-10-CM | POA: Diagnosis not present

## 2024-02-19 DIAGNOSIS — M542 Cervicalgia: Secondary | ICD-10-CM | POA: Diagnosis not present

## 2024-02-23 ENCOUNTER — Ambulatory Visit: Admitting: Podiatry

## 2024-02-23 DIAGNOSIS — M5412 Radiculopathy, cervical region: Secondary | ICD-10-CM | POA: Diagnosis not present

## 2024-02-23 DIAGNOSIS — I739 Peripheral vascular disease, unspecified: Secondary | ICD-10-CM | POA: Diagnosis not present

## 2024-02-23 DIAGNOSIS — B351 Tinea unguium: Secondary | ICD-10-CM

## 2024-02-23 DIAGNOSIS — L84 Corns and callosities: Secondary | ICD-10-CM

## 2024-02-23 DIAGNOSIS — M79675 Pain in left toe(s): Secondary | ICD-10-CM

## 2024-02-23 DIAGNOSIS — M79674 Pain in right toe(s): Secondary | ICD-10-CM

## 2024-02-23 DIAGNOSIS — M5030 Other cervical disc degeneration, unspecified cervical region: Secondary | ICD-10-CM | POA: Diagnosis not present

## 2024-02-23 NOTE — Progress Notes (Signed)
 Subjective:  Patient ID: John Keith, male    DOB: 10-24-1950,  MRN: [id number]  John Keith presents to clinic today for:  Chief Complaint  Patient presents with   [facility]    RFC with callous Not diabetic and takes Eliquis    Patient notes nails are thick and elongated, causing pain in shoe gear when ambulating.  He has painful calluses bilateral submet 1 and plantar left hallux  PCP is Bair, Luke, MD. last seen around 01-04-24  Past Medical History:  Diagnosis Date   Abnormal x-ray of neck 06/16/2021   Acute encephalopathy 12/18/2023   Allergy    Arthritis    Atrial flutter (HCC)    Bilateral carpal tunnel syndrome 01/04/2024   Carpal tunnel syndrome on left    Chest pain of uncertain etiology 10/24/2019   Constipation 03/10/2016   COVID-19    07/07/19   COVID-19 virus detected 07/12/2019   DDD (degenerative disc disease), thoracolumbar    multilevel   Degenerative joint disease of left shoulder    02/2011    Diverticulosis    left colon (2008)    ED (erectile dysfunction)    02/2011    External hemorrhoid 03/07/2015   Fatigue 06/03/2016   GERD (gastroesophageal reflux disease)    H. pylori infection    Hyperlipidemia    Hypertension    Insomnia    Lactose intolerance    Low back pain    PVC (premature ventricular contraction) 10/24/2019   Shingles    07/2018   Smoking    smoking since age 89 y.o, quit in 2023   Tobacco abuse 11/06/2012   Toe fracture, left    4th in 2014   Vitamin D  deficiency     Allergies  Allergen Reactions   Crestor  [Rosuvastatin  Calcium ] Other (See Comments)    Elevated muscle enzymes    Lipitor [Atorvastatin  Calcium ] Other (See Comments)    Elevated muscle enzymes     Objective:  John Keith is a pleasant 73 y.o. male in NAD. AAO x 3.  Vascular Examination: Patient has palpable DP pulse, absent PT pulse bilateral.  Delayed capillary refill bilateral toes.  Sparse digital hair bilateral.  Proximal to distal  cooling WNL bilateral.    Dermatological Examination: Interspaces are clear with no open lesions noted bilateral.  Skin is shiny and atrophic bilateral.  Nails are 3-28mm thick, with yellowish/brown discoloration, subungual debris and distal onycholysis x10.  There is pain with compression of nails x10.  There are hyperkeratotic lesions noted right submet 1 and plantar left hallux proximal to the IPJ  Patient qualifies for at-risk foot care because of PVD.  Assessment/Plan: 1. Pain due to onychomycosis of toenails of both feet   2. Callus of foot   3. PVD (peripheral vascular disease) (HCC)    Mycotic nails x10 were sharply debrided with sterile nail nippers and power debriding burr to decrease bulk and length.  Hyperkeratotic lesions x3 on ball of feet were shaved with #312 blade.   Return in about 3 months (around 05/25/2024) for RFC.   Awanda CHARM Imperial, DPM, FACFAS [facility]     [address], [place] [address]  Office [phone]  Fax [phone]

## 2024-02-27 ENCOUNTER — Ambulatory Visit: Admitting: Internal Medicine

## 2024-02-27 ENCOUNTER — Ambulatory Visit: Payer: PPO | Admitting: Family Medicine

## 2024-03-01 ENCOUNTER — Other Ambulatory Visit (HOSPITAL_COMMUNITY): Payer: Self-pay

## 2024-03-01 NOTE — Progress Notes (Signed)
 Paramedicine Encounter    Patient ID: John Keith, male    DOB: 05-02-1951, 73 y.o.   MRN: [id number]   Complaints- shoulder and neck pain- on a prednisone  taper (3 days left)  Assessment- CAOx4, warm and dry seated at kitchen table reporting to be feeling okay but having on going pain with his shoulder and neck. Lungs clear, vitals within normal limits for him. No weight gain. No lower leg swelling.   Compliance with meds- no missed doses   Pill box filled- for two weeks   Refills needed- none   Meds changes since last visit- none     Social changes- none    VISIT SUMMARY- Arrived for home visit for John Keith who reports to be feeling well with no complaints aside from pain ongoing with his shoulder and neck in which he is using a prednisone  taper for same. He has 3 days left. Vitals obtained and as noted. Medications reviewed and confirmed. Pill box filled for two weeks. We reviewed upcoming appointments. HF education provided. Home visit complete. I plan to see him in two weeks.   [facility] apt Sept 8.   BP 128/68   Pulse 78   Resp 16   Wt 231 lb (104.8 kg)   SpO2 98%   BMI 24.75 kg/m  Weight yesterday-232 Last visit weight-233     ACTION: Home visit completed     Patient Care Team: Bair, Kalpana, MD as PCP - General (Family Medicine) [name], MD as PCP - Electrophysiology (Cardiology) [name], [name], MD as PCP - Cardiology (Cardiology) [name], MD as Consulting Physician (Internal Medicine) [name], MD as Attending Physician (Cardiology) [name], MD as Consulting Physician (Cardiology)  Patient Active Problem List   Diagnosis Date Noted   Hyponatremia 01/04/2024   Yeast cystitis 01/04/2024   Cardiac sarcoidosis 01/04/2024   Primary osteoarthritis 01/04/2024   Presence of heart assist device (HCC) 01/04/2024   Skin lesions 01/04/2024   Hypocalcemia 01/04/2024   Elevated serum free T4 level 01/04/2024    On anticoagulant therapy 08/29/2023   On amiodarone  therapy 08/29/2023   Onychomycosis 01/31/2023   Statin myopathy 11/15/2022   Encounter for screening for lung cancer 11/15/2022   Chronic systolic heart failure (HCC) 12/15/2021   Chronic combined systolic and diastolic CHF (congestive heart failure) (HCC) 12/15/2021   Ventricular tachycardia (HCC) 12/07/2021   Typical atrial flutter (HCC) 09/24/2021   Secondary hypercoagulable state (HCC) 09/24/2021   Abnormal MRI, lumbar spine 10/23/2020   Chronic low back pain 10/23/2020   Neuropathy 10/23/2020   Coronary artery disease of native artery of native heart with stable angina pectoris (HCC) 10/23/2020   Impingement syndrome of left shoulder region 08/22/2020   Pain in joint of left shoulder 07/24/2020   Chronic left shoulder pain 07/11/2020   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   Normocytic normochromic anemia 12/13/2019   PVC (premature ventricular contraction) 10/24/2019   Ventricular premature beats 10/24/2019   Gastric ulcer without hemorrhage or perforation 09/28/2019   Postherpetic neuralgia 08/17/2018   Benign prostatic hyperplasia 08/01/2018   Hip pain 06/29/2018   Aortic atherosclerosis (HCC) 09/22/2016   Lower urinary tract symptoms (LUTS) 06/03/2016   Lumbar radiculopathy 02/20/2016   Insomnia 06/10/2015   Health care maintenance 01/09/2014   Hyperlipidemia 11/09/2012   Vitamin D  deficiency 11/09/2012   Essential hypertension 11/06/2012   Seasonal allergies 11/06/2012   Erectile dysfunction 11/06/2012   GERD (gastroesophageal reflux disease) 11/06/2012   Lactose intolerance 11/06/2012  Diverticulosis of colon without hemorrhage 11/06/2012   Degeneration of thoracolumbar intervertebral disc 11/06/2012    Current Outpatient Medications:    apixaban  (ELIQUIS ) 5 MG TABS tablet, TAKE 1 TABLET(5 MG) BY MOUTH TWICE DAILY, Disp: 60 tablet, Rfl: 11   calcium  carbonate (OS-CAL - DOSED IN MG OF ELEMENTAL CALCIUM ) 1250 (500  Ca) MG tablet, Take 1 tablet (500 mg of elemental calcium  total) by mouth daily with breakfast., Disp: 30 tablet, Rfl: 11   carvedilol  (COREG ) 6.25 MG tablet, TAKE 1 TABLET(6.25 MG) BY MOUTH TWICE DAILY WITH A MEAL, Disp: 180 tablet, Rfl: 3   Cholecalciferol  (VITAMIN D -3) 125 MCG (5000 UT) TABS, Take 1 tablet by mouth daily., Disp: 30 tablet, Rfl: 11   ezetimibe  (ZETIA ) 10 MG tablet, Take 1 tablet (10 mg total) by mouth daily., Disp: 90 tablet, Rfl: 3   FARXIGA  10 MG TABS tablet, Take 1 tablet (10 mg total) by mouth daily., Disp: 90 tablet, Rfl: 3   ferrous sulfate  325 (65 FE) MG tablet, Take 325 mg by mouth daily with breakfast., Disp: , Rfl:    folic acid  (FOLVITE ) 1 MG tablet, Take 1 tablet (1 mg total) by mouth daily., Disp: 30 tablet, Rfl: 6   gabapentin  (NEURONTIN ) 300 MG capsule, TAKE 3 CAPSULES(900 MG) BY MOUTH DAILY, Disp: 270 capsule, Rfl: 1   ketoconazole  (NIZORAL ) 2 % shampoo, Apply 1 Application topically 2 (two) times a week., Disp: 120 mL, Rfl: 1   methotrexate  (RHEUMATREX) 2.5 MG tablet, TAKE 20MG (8 TABLETS) EVERY WEDNESDAY. PROTECT FROM LIGHT AS DIRECTED, Disp: 150 tablet, Rfl: 0   Multiple Vitamin (MULTIVITAMIN) tablet, Take 1 tablet by mouth daily., Disp: , Rfl:    pantoprazole  (PROTONIX ) 40 MG tablet, Take 1 tablet (40 mg total) by mouth daily. 30 min before food, Disp: 30 tablet, Rfl: 6   sacubitril -valsartan  (ENTRESTO ) 24-26 MG, Take 1 tablet by mouth 2 (two) times daily., Disp: 180 tablet, Rfl: 3   spironolactone  (ALDACTONE ) 25 MG tablet, Take 1 tablet (25 mg total) by mouth daily., Disp: 90 tablet, Rfl: 3   amiodarone  (PACERONE ) 200 MG tablet, , Disp: , Rfl:  Allergies  Allergen Reactions   Crestor  [Rosuvastatin  Calcium ] Other (See Comments)    Elevated muscle enzymes    Lipitor [Atorvastatin  Calcium ] Other (See Comments)    Elevated muscle enzymes      Social History   Socioeconomic History   Marital status: Married    Spouse name: Gwendolyn   Number of  children: 2   Years of education: Not on file   Highest education level: High school graduate  Occupational History   Occupation: Retired    Comment: Semi  Tobacco Use   Smoking status: Former    Current packs/day: 0.00    Average packs/day: 0.5 packs/day for 49.0 years (24.5 ttl pk-yrs)    Types: Cigarettes    Start date: 07/06/1971    Quit date: 06/26/2020    Years since quitting: 3.6   Smokeless tobacco: Never   Tobacco comments:    Former smoker 09/24/21  Vaping Use   Vaping status: Never Used  Substance and Sexual Activity   Alcohol use: No    Alcohol/week: 0.0 standard drinks of alcohol   Drug use: No   Sexual activity: Not on file  Other Topics Concern   Not on file  Social History Narrative   Married    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    On child    1 son, 1 daughter   Systems analyst op  Owns guns    Wears seat belt, safe in relationship    Smoker    Retired 06/27/2019   Social Drivers of [employer] Strain: Low Risk  (11/22/2022)   Overall Financial Resource Strain (CARDIA)    Difficulty of Paying Living Expenses: Not hard at all  Food Insecurity: No Food Insecurity (12/26/2023)   Hunger Vital Sign    Worried About Running Out of Food in the Last Year: Never true    Ran Out of Food in the Last Year: Never true  Transportation Needs: No Transportation Needs (12/26/2023)   PRAPARE - Administrator, Civil Service (Medical): No    Lack of Transportation (Non-Medical): No  Physical Activity: Sufficiently Active (11/22/2022)   Exercise Vital Sign    Days of Exercise per Week: 3 days    Minutes of Exercise per Session: 60 min  Stress: No Stress Concern Present (11/22/2022)   [employer] of Occupational Health - Occupational Stress Questionnaire    Feeling of Stress : Not at all  Social Connections: Patient Declined (12/18/2023)   Social Connection and Isolation Panel    Frequency of Communication with Friends and Family: Patient declined    Frequency of  Social Gatherings with Friends and Family: Patient declined    Attends Religious Services: Patient declined    Active Member of Clubs or Organizations: Patient declined    Attends Banker Meetings: Patient declined    Marital Status: Patient declined  Intimate Partner Violence: Not At Risk (12/26/2023)   Humiliation, Afraid, Rape, and Kick questionnaire    Fear of Current or Ex-Partner: No    Emotionally Abused: No    Physically Abused: No    Sexually Abused: No    Physical Exam      Future Appointments  Date Time Provider Department Center  03/09/2024  7:00 AM [facility] DEVICE REMOTES [facility]  04/05/2024  8:00 AM Bair, Kalpana, MD [facility] [address]  05/24/2024  9:00 AM McCaughan, Dia D, DPM [facility]  06/08/2024  7:00 AM [facility] DEVICE REMOTES [facility]  06/18/2024  1:00 PM Bair, Luke, MD [facility] [address]

## 2024-03-09 ENCOUNTER — Ambulatory Visit (INDEPENDENT_AMBULATORY_CARE_PROVIDER_SITE_OTHER): Payer: PPO

## 2024-03-09 DIAGNOSIS — I472 Ventricular tachycardia, unspecified: Secondary | ICD-10-CM

## 2024-03-10 LAB — CUP PACEART REMOTE DEVICE CHECK
Battery Remaining Longevity: 122 mo
Battery Voltage: 3.02 V
Brady Statistic RV Percent Paced: 0.22 %
Date Time Interrogation Session: 20250905012504
HighPow Impedance: 66 Ohm
Implantable Lead Connection Status: 753985
Implantable Lead Implant Date: 20230608
Implantable Lead Location: 753860
Implantable Pulse Generator Implant Date: 20230608
Lead Channel Impedance Value: 285 Ohm
Lead Channel Impedance Value: 342 Ohm
Lead Channel Pacing Threshold Amplitude: 0.875 V
Lead Channel Pacing Threshold Pulse Width: 0.4 ms
Lead Channel Sensing Intrinsic Amplitude: 8 mV
Lead Channel Sensing Intrinsic Amplitude: 8 mV
Lead Channel Setting Pacing Amplitude: 2 V
Lead Channel Setting Pacing Pulse Width: 0.4 ms
Lead Channel Setting Sensing Sensitivity: 0.3 mV
Zone Setting Status: 755011
Zone Setting Status: 755011

## 2024-03-11 ENCOUNTER — Ambulatory Visit: Payer: Self-pay | Admitting: Cardiology

## 2024-03-12 DIAGNOSIS — E663 Overweight: Secondary | ICD-10-CM | POA: Diagnosis not present

## 2024-03-12 DIAGNOSIS — D8685 Sarcoid myocarditis: Secondary | ICD-10-CM | POA: Diagnosis not present

## 2024-03-12 DIAGNOSIS — Z6825 Body mass index (BMI) 25.0-25.9, adult: Secondary | ICD-10-CM | POA: Diagnosis not present

## 2024-03-12 DIAGNOSIS — M5136 Other intervertebral disc degeneration, lumbar region with discogenic back pain only: Secondary | ICD-10-CM | POA: Diagnosis not present

## 2024-03-12 DIAGNOSIS — M1991 Primary osteoarthritis, unspecified site: Secondary | ICD-10-CM | POA: Diagnosis not present

## 2024-03-12 DIAGNOSIS — Z79899 Other long term (current) drug therapy: Secondary | ICD-10-CM | POA: Diagnosis not present

## 2024-03-12 DIAGNOSIS — G5603 Carpal tunnel syndrome, bilateral upper limbs: Secondary | ICD-10-CM | POA: Diagnosis not present

## 2024-03-13 LAB — LAB REPORT - SCANNED: EGFR: 70

## 2024-03-15 ENCOUNTER — Other Ambulatory Visit (HOSPITAL_COMMUNITY): Payer: Self-pay

## 2024-03-15 NOTE — Progress Notes (Signed)
 Paramedicine Encounter    Patient ID: John Keith, male    DOB: March 22, 1951, 73 y.o.   MRN: [id number]   Complaints- none   Assessment- CAOX4, warm and dry seated in the kitchen table with no complaints.   Compliance with meds- no missed doses of meds   Pill box filled- for two weeks   Refills needed-  methotrexate    Meds changes since last visit-  none    Social changes- Has still not received his reimbursement for entresto - gave him number to call to follow up.     VISIT SUMMARY- Arrived for home visit for John Keith who reports to be feeling well with no complaints. Vitals and assessment obtained. No swelling. No weight gain. Lungs clear. No complaints. Meds reviewed and confirmed. Pill box filled for two weeks. HF education provided. Appointments confirmed. Home visit complete. I will see him in two weeks.   BP 120/68   Pulse 65   Resp 18   Wt 233 lb (105.7 kg)   SpO2 97%   BMI 24.97 kg/m  Weight yesterday-- 234lbs  Last visit weight-- 231lbs     ACTION: Home visit completed     Patient Care Team: Abbey Bruckner, MD as PCP - General (Family Medicine) [name], MD as PCP - Electrophysiology (Cardiology) [name], [name], MD as PCP - Cardiology (Cardiology) [name], MD as Consulting Physician (Internal Medicine) [name], MD as Attending Physician (Cardiology) [name], MD as Consulting Physician (Cardiology)  Patient Active Problem List   Diagnosis Date Noted   Hyponatremia 01/04/2024   Yeast cystitis 01/04/2024   Cardiac sarcoidosis 01/04/2024   Primary osteoarthritis 01/04/2024   Presence of heart assist device (HCC) 01/04/2024   Skin lesions 01/04/2024   Hypocalcemia 01/04/2024   Elevated serum free T4 level 01/04/2024   On anticoagulant therapy 08/29/2023   On amiodarone  therapy 08/29/2023   Onychomycosis 01/31/2023   Statin myopathy 11/15/2022   Encounter for screening for lung cancer 11/15/2022   Chronic  systolic heart failure (HCC) 12/15/2021   Chronic combined systolic and diastolic CHF (congestive heart failure) (HCC) 12/15/2021   Ventricular tachycardia (HCC) 12/07/2021   Typical atrial flutter (HCC) 09/24/2021   Secondary hypercoagulable state (HCC) 09/24/2021   Abnormal MRI, lumbar spine 10/23/2020   Chronic low back pain 10/23/2020   Neuropathy 10/23/2020   Coronary artery disease of native artery of native heart with stable angina pectoris (HCC) 10/23/2020   Impingement syndrome of left shoulder region 08/22/2020   Pain in joint of left shoulder 07/24/2020   Chronic left shoulder pain 07/11/2020   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   Normocytic normochromic anemia 12/13/2019   PVC (premature ventricular contraction) 10/24/2019   Ventricular premature beats 10/24/2019   Gastric ulcer without hemorrhage or perforation 09/28/2019   Postherpetic neuralgia 08/17/2018   Benign prostatic hyperplasia 08/01/2018   Hip pain 06/29/2018   Aortic atherosclerosis (HCC) 09/22/2016   Lower urinary tract symptoms (LUTS) 06/03/2016   Lumbar radiculopathy 02/20/2016   Insomnia 06/10/2015   Health care maintenance 01/09/2014   Hyperlipidemia 11/09/2012   Vitamin D  deficiency 11/09/2012   Essential hypertension 11/06/2012   Seasonal allergies 11/06/2012   Erectile dysfunction 11/06/2012   GERD (gastroesophageal reflux disease) 11/06/2012   Lactose intolerance 11/06/2012   Diverticulosis of colon without hemorrhage 11/06/2012   Degeneration of thoracolumbar intervertebral disc 11/06/2012    Current Outpatient Medications:    apixaban  (ELIQUIS ) 5 MG TABS tablet, TAKE 1 TABLET(5 MG) BY MOUTH TWICE DAILY, Disp: 60  tablet, Rfl: 11   calcium  carbonate (OS-CAL - DOSED IN MG OF ELEMENTAL CALCIUM ) 1250 (500 Ca) MG tablet, Take 1 tablet (500 mg of elemental calcium  total) by mouth daily with breakfast., Disp: 30 tablet, Rfl: 11   carvedilol  (COREG ) 6.25 MG tablet, TAKE 1 TABLET(6.25 MG) BY MOUTH TWICE  DAILY WITH A MEAL, Disp: 180 tablet, Rfl: 3   Cholecalciferol  (VITAMIN D -3) 125 MCG (5000 UT) TABS, Take 1 tablet by mouth daily., Disp: 30 tablet, Rfl: 11   ezetimibe  (ZETIA ) 10 MG tablet, Take 1 tablet (10 mg total) by mouth daily., Disp: 90 tablet, Rfl: 3   FARXIGA  10 MG TABS tablet, Take 1 tablet (10 mg total) by mouth daily., Disp: 90 tablet, Rfl: 3   ferrous sulfate  325 (65 FE) MG tablet, Take 325 mg by mouth daily with breakfast., Disp: , Rfl:    folic acid  (FOLVITE ) 1 MG tablet, Take 1 tablet (1 mg total) by mouth daily., Disp: 30 tablet, Rfl: 6   gabapentin  (NEURONTIN ) 300 MG capsule, TAKE 3 CAPSULES(900 MG) BY MOUTH DAILY, Disp: 270 capsule, Rfl: 1   ketoconazole  (NIZORAL ) 2 % shampoo, Apply 1 Application topically 2 (two) times a week., Disp: 120 mL, Rfl: 1   methotrexate  (RHEUMATREX) 2.5 MG tablet, TAKE 20MG (8 TABLETS) EVERY WEDNESDAY. PROTECT FROM LIGHT AS DIRECTED, Disp: 150 tablet, Rfl: 0   Multiple Vitamin (MULTIVITAMIN) tablet, Take 1 tablet by mouth daily., Disp: , Rfl:    pantoprazole  (PROTONIX ) 40 MG tablet, Take 1 tablet (40 mg total) by mouth daily. 30 min before food, Disp: 30 tablet, Rfl: 6   sacubitril -valsartan  (ENTRESTO ) 24-26 MG, Take 1 tablet by mouth 2 (two) times daily., Disp: 180 tablet, Rfl: 3   amiodarone  (PACERONE ) 200 MG tablet, , Disp: , Rfl:    spironolactone  (ALDACTONE ) 25 MG tablet, Take 1 tablet (25 mg total) by mouth daily., Disp: 90 tablet, Rfl: 3 Allergies  Allergen Reactions   Crestor  [Rosuvastatin  Calcium ] Other (See Comments)    Elevated muscle enzymes    Lipitor [Atorvastatin  Calcium ] Other (See Comments)    Elevated muscle enzymes      Social History   Socioeconomic History   Marital status: Married    Spouse name: Gwendolyn   Number of children: 2   Years of education: Not on file   Highest education level: High school graduate  Occupational History   Occupation: Retired    Comment: Semi  Tobacco Use   Smoking status: Former     Current packs/day: 0.00    Average packs/day: 0.5 packs/day for 49.0 years (24.5 ttl pk-yrs)    Types: Cigarettes    Start date: 07/06/1971    Quit date: 06/26/2020    Years since quitting: 3.7   Smokeless tobacco: Never   Tobacco comments:    Former smoker 09/24/21  Vaping Use   Vaping status: Never Used  Substance and Sexual Activity   Alcohol use: No    Alcohol/week: 0.0 standard drinks of alcohol   Drug use: No   Sexual activity: Not on file  Other Topics Concern   Not on file  Social History Narrative   Married    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    On child    1 son, 1 daughter   Machine op   Owns guns    Wears seat belt, safe in relationship    Smoker    Retired 06/27/2019   Social Drivers of Health   Financial Resource Strain: Low Risk  (11/22/2022)   Overall Financial  Resource Strain (CARDIA)    Difficulty of Paying Living Expenses: Not hard at all  Food Insecurity: No Food Insecurity (12/26/2023)   Hunger Vital Sign    Worried About Running Out of Food in the Last Year: Never true    Ran Out of Food in the Last Year: Never true  Transportation Needs: No Transportation Needs (12/26/2023)   PRAPARE - Administrator, Civil Service (Medical): No    Lack of Transportation (Non-Medical): No  Physical Activity: Sufficiently Active (11/22/2022)   Exercise Vital Sign    Days of Exercise per Week: 3 days    Minutes of Exercise per Session: 60 min  Stress: No Stress Concern Present (11/22/2022)   [employer] of Occupational Health - Occupational Stress Questionnaire    Feeling of Stress : Not at all  Social Connections: Patient Declined (12/18/2023)   Social Connection and Isolation Panel    Frequency of Communication with Friends and Family: Patient declined    Frequency of Social Gatherings with Friends and Family: Patient declined    Attends Religious Services: Patient declined    Active Member of Clubs or Organizations: Patient declined    Attends Banker  Meetings: Patient declined    Marital Status: Patient declined  Intimate Partner Violence: Not At Risk (12/26/2023)   Humiliation, Afraid, Rape, and Kick questionnaire    Fear of Current or Ex-Partner: No    Emotionally Abused: No    Physically Abused: No    Sexually Abused: No    Physical Exam      Future Appointments  Date Time Provider Department Center  04/05/2024  8:00 AM Abbey Bruckner, MD [facility] [address]  05/24/2024  9:00 AM McCaughan, Dia D, DPM [facility]  06/08/2024  7:00 AM [facility] DEVICE REMOTES [facility]  06/18/2024  1:00 PM Bair, Bruckner, MD [facility] [address]

## 2024-03-22 NOTE — Progress Notes (Signed)
Remote ICD Transmission.

## 2024-03-29 ENCOUNTER — Other Ambulatory Visit (HOSPITAL_COMMUNITY): Payer: Self-pay

## 2024-03-29 NOTE — Progress Notes (Signed)
 Paramedicine Encounter    Patient ID: John Keith, male    DOB: Jun 25, 1951, 73 y.o.   MRN: [id number]   Complaints- none   Assessment- CAOX4 , warm and dry, ambulatory without shortness of breath, no dizziness, no chest pain, no swelling, lungs clear, vitals within normal limits.   Compliance with meds- no missed doses   Pill box filled- for two weeks   Refills needed- eliquis , folic acid    Meds changes since last visit- none     Social changes- none    VISIT SUMMARY- Arrived for home visit for John Keith who reports to be feeling well with no complaints today. He says he has been feeling well. Weight is stable. Vital within normal limits. No missed meds over the last two weeks. Admits he has been eating some salty foods. Reminded him of HF education. He understood. Meds reviewed and pill box filled for two weeks. Refills recorded and will be called into [facility]. We reviewed upcoming appointments and confirmed same. No needs at present. Home visit complete.   BP 126/70   Pulse 74   Resp 18   Wt 234 lb 6.4 oz (106.3 kg)   SpO2 96%   BMI 25.12 kg/m  Weight yesterday-- 234lbs  Last visit weight-- 233lbs    [name], EMT-Paramedic [phone] 03/29/2024     ACTION: Home visit completed     Patient Care Team: Abbey Bruckner, MD as PCP - General (Family Medicine) [name], MD as PCP - Electrophysiology (Cardiology) [name], [name], MD as PCP - Cardiology (Cardiology) [name], MD as Consulting Physician (Internal Medicine) [name], MD as Attending Physician (Cardiology) [name], MD as Consulting Physician (Cardiology)  Patient Active Problem List   Diagnosis Date Noted   Hyponatremia 01/04/2024   Yeast cystitis 01/04/2024   Cardiac sarcoidosis 01/04/2024   Primary osteoarthritis 01/04/2024   Presence of heart assist device (HCC) 01/04/2024   Skin lesions 01/04/2024   Hypocalcemia 01/04/2024   Elevated serum  free T4 level 01/04/2024   On anticoagulant therapy 08/29/2023   On amiodarone  therapy 08/29/2023   Onychomycosis 01/31/2023   Statin myopathy 11/15/2022   Encounter for screening for lung cancer 11/15/2022   Chronic systolic heart failure (HCC) 12/15/2021   Chronic combined systolic and diastolic CHF (congestive heart failure) (HCC) 12/15/2021   Ventricular tachycardia (HCC) 12/07/2021   Typical atrial flutter (HCC) 09/24/2021   Secondary hypercoagulable state 09/24/2021   Abnormal MRI, lumbar spine 10/23/2020   Chronic low back pain 10/23/2020   Neuropathy 10/23/2020   Coronary artery disease of native artery of native heart with stable angina pectoris 10/23/2020   Impingement syndrome of left shoulder region 08/22/2020   Pain in joint of left shoulder 07/24/2020   Chronic left shoulder pain 07/11/2020   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   Normocytic normochromic anemia 12/13/2019   PVC (premature ventricular contraction) 10/24/2019   Ventricular premature beats 10/24/2019   Gastric ulcer without hemorrhage or perforation 09/28/2019   Postherpetic neuralgia 08/17/2018   Benign prostatic hyperplasia 08/01/2018   Hip pain 06/29/2018   Aortic atherosclerosis 09/22/2016   Lower urinary tract symptoms (LUTS) 06/03/2016   Lumbar radiculopathy 02/20/2016   Insomnia 06/10/2015   Health care maintenance 01/09/2014   Hyperlipidemia 11/09/2012   Vitamin D  deficiency 11/09/2012   Essential hypertension 11/06/2012   Seasonal allergies 11/06/2012   Erectile dysfunction 11/06/2012   GERD (gastroesophageal reflux disease) 11/06/2012   Lactose intolerance 11/06/2012   Diverticulosis of colon without hemorrhage 11/06/2012  Degeneration of thoracolumbar intervertebral disc 11/06/2012    Current Outpatient Medications:    apixaban  (ELIQUIS ) 5 MG TABS tablet, TAKE 1 TABLET(5 MG) BY MOUTH TWICE DAILY, Disp: 60 tablet, Rfl: 11   calcium  carbonate (OS-CAL - DOSED IN MG OF ELEMENTAL CALCIUM )  1250 (500 Ca) MG tablet, Take 1 tablet (500 mg of elemental calcium  total) by mouth daily with breakfast., Disp: 30 tablet, Rfl: 11   carvedilol  (COREG ) 6.25 MG tablet, TAKE 1 TABLET(6.25 MG) BY MOUTH TWICE DAILY WITH A MEAL, Disp: 180 tablet, Rfl: 3   Cholecalciferol  (VITAMIN D -3) 125 MCG (5000 UT) TABS, Take 1 tablet by mouth daily., Disp: 30 tablet, Rfl: 11   ezetimibe  (ZETIA ) 10 MG tablet, Take 1 tablet (10 mg total) by mouth daily., Disp: 90 tablet, Rfl: 3   ferrous sulfate  325 (65 FE) MG tablet, Take 325 mg by mouth daily with breakfast., Disp: , Rfl:    folic acid  (FOLVITE ) 1 MG tablet, Take 1 tablet (1 mg total) by mouth daily., Disp: 30 tablet, Rfl: 6   gabapentin  (NEURONTIN ) 300 MG capsule, TAKE 3 CAPSULES(900 MG) BY MOUTH DAILY, Disp: 270 capsule, Rfl: 1   ketoconazole  (NIZORAL ) 2 % shampoo, Apply 1 Application topically 2 (two) times a week., Disp: 120 mL, Rfl: 1   methotrexate  (RHEUMATREX) 2.5 MG tablet, TAKE 20MG (8 TABLETS) EVERY WEDNESDAY. PROTECT FROM LIGHT AS DIRECTED, Disp: 150 tablet, Rfl: 0   Multiple Vitamin (MULTIVITAMIN) tablet, Take 1 tablet by mouth daily., Disp: , Rfl:    pantoprazole  (PROTONIX ) 40 MG tablet, Take 1 tablet (40 mg total) by mouth daily. 30 min before food, Disp: 30 tablet, Rfl: 6   sacubitril -valsartan  (ENTRESTO ) 24-26 MG, Take 1 tablet by mouth 2 (two) times daily., Disp: 180 tablet, Rfl: 3   spironolactone  (ALDACTONE ) 25 MG tablet, Take 1 tablet (25 mg total) by mouth daily., Disp: 90 tablet, Rfl: 3   amiodarone  (PACERONE ) 200 MG tablet, , Disp: , Rfl:    FARXIGA  10 MG TABS tablet, Take 1 tablet (10 mg total) by mouth daily., Disp: 90 tablet, Rfl: 3 Allergies  Allergen Reactions   Crestor  [Rosuvastatin  Calcium ] Other (See Comments)    Elevated muscle enzymes    Lipitor [Atorvastatin  Calcium ] Other (See Comments)    Elevated muscle enzymes      Social History   Socioeconomic History   Marital status: Married    Spouse name: Gwendolyn   Number  of children: 2   Years of education: Not on file   Highest education level: High school graduate  Occupational History   Occupation: Retired    Comment: Semi  Tobacco Use   Smoking status: Former    Current packs/day: 0.00    Average packs/day: 0.5 packs/day for 49.0 years (24.5 ttl pk-yrs)    Types: Cigarettes    Start date: 07/06/1971    Quit date: 06/26/2020    Years since quitting: 3.7   Smokeless tobacco: Never   Tobacco comments:    Former smoker 09/24/21  Vaping Use   Vaping status: Never Used  Substance and Sexual Activity   Alcohol use: No    Alcohol/week: 0.0 standard drinks of alcohol   Drug use: No   Sexual activity: Not on file  Other Topics Concern   Not on file  Social History Narrative   Married    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    On child    1 son, 1 daughter   Systems analyst op   Owns guns    Wears seat belt,  safe in relationship    Smoker    Retired 06/27/2019   Social Drivers of [employer]: Low Risk  (11/22/2022)   Overall Financial Resource Strain (CARDIA)    Difficulty of Paying Living Expenses: Not hard at all  Food Insecurity: No Food Insecurity (12/26/2023)   Hunger Vital Sign    Worried About Running Out of Food in the Last Year: Never true    Ran Out of Food in the Last Year: Never true  Transportation Needs: No Transportation Needs (12/26/2023)   PRAPARE - Administrator, Civil Service (Medical): No    Lack of Transportation (Non-Medical): No  Physical Activity: Sufficiently Active (11/22/2022)   Exercise Vital Sign    Days of Exercise per Week: 3 days    Minutes of Exercise per Session: 60 min  Stress: No Stress Concern Present (11/22/2022)   [employer] of Occupational Health - Occupational Stress Questionnaire    Feeling of Stress : Not at all  Social Connections: Patient Declined (12/18/2023)   Social Connection and Isolation Panel    Frequency of Communication with Friends and Family: Patient declined    Frequency  of Social Gatherings with Friends and Family: Patient declined    Attends Religious Services: Patient declined    Active Member of Clubs or Organizations: Patient declined    Attends Banker Meetings: Patient declined    Marital Status: Patient declined  Intimate Partner Violence: Not At Risk (12/26/2023)   Humiliation, Afraid, Rape, and Kick questionnaire    Fear of Current or Ex-Partner: No    Emotionally Abused: No    Physically Abused: No    Sexually Abused: No    Physical Exam      Future Appointments  Date Time Provider Department Center  04/05/2024  8:00 AM Abbey Bruckner, MD [facility] [id number]  05/24/2024  9:00 AM McCaughan, Dia D, DPM [facility]  06/08/2024  7:00 AM [facility] DEVICE REMOTES [facility]  06/18/2024  1:00 PM Bair, Bruckner, MD [facility] [address]

## 2024-04-02 ENCOUNTER — Other Ambulatory Visit (HOSPITAL_COMMUNITY): Payer: Self-pay

## 2024-04-05 ENCOUNTER — Ambulatory Visit (INDEPENDENT_AMBULATORY_CARE_PROVIDER_SITE_OTHER)

## 2024-04-05 VITALS — BP 122/78 | HR 72 | Temp 97.9°F | Ht >= 80 in | Wt 241.0 lb

## 2024-04-05 DIAGNOSIS — Z122 Encounter for screening for malignant neoplasm of respiratory organs: Secondary | ICD-10-CM

## 2024-04-05 DIAGNOSIS — K219 Gastro-esophageal reflux disease without esophagitis: Secondary | ICD-10-CM | POA: Diagnosis not present

## 2024-04-05 DIAGNOSIS — G629 Polyneuropathy, unspecified: Secondary | ICD-10-CM

## 2024-04-05 DIAGNOSIS — D8685 Sarcoid myocarditis: Secondary | ICD-10-CM | POA: Diagnosis not present

## 2024-04-05 DIAGNOSIS — R7989 Other specified abnormal findings of blood chemistry: Secondary | ICD-10-CM | POA: Diagnosis not present

## 2024-04-05 DIAGNOSIS — D6869 Other thrombophilia: Secondary | ICD-10-CM | POA: Diagnosis not present

## 2024-04-05 DIAGNOSIS — M5416 Radiculopathy, lumbar region: Secondary | ICD-10-CM | POA: Diagnosis not present

## 2024-04-05 MED ORDER — GABAPENTIN 300 MG PO CAPS
900.0000 mg | ORAL_CAPSULE | Freq: Every day | ORAL | 1 refills | Status: AC
Start: 2024-04-05 — End: ?

## 2024-04-05 MED ORDER — PANTOPRAZOLE SODIUM 40 MG PO TBEC: 40.0000 mg | DELAYED_RELEASE_TABLET | Freq: Every day | ORAL | 6 refills | Status: AC

## 2024-04-05 NOTE — Assessment & Plan Note (Signed)
 Qualifies for low dose Ct lung cancer screening. Referral made today.  CT abdomen pelvis done on 12/21/23: Patchy ground-glass and tree-in-bud nodular opacities in the left lower lobe may be infectious/inflammatory. If abnormal CT lungs he will benefit from pulmonology evaluation even though he is asymptomatic given h/o sarcoidosis on Methotrexate  and abnormal previous CT finding. Pending result for further management.

## 2024-04-05 NOTE — Assessment & Plan Note (Signed)
 On Methotrexate  20 mg every Wednesday, management per cardiology and rheumatologist at [facility].

## 2024-04-05 NOTE — Assessment & Plan Note (Signed)
 On Eliquis5 mg BID. No new concerns. F/U with cardiologist as scheduled in December.

## 2024-04-05 NOTE — Assessment & Plan Note (Signed)
 Repeat CBC, iron panel. Likely secondary to inflammatory process.

## 2024-04-05 NOTE — Patient Instructions (Signed)
-   You will get a phone call to schedule an appointment for CT lungs screening for lung cancer.

## 2024-04-05 NOTE — Assessment & Plan Note (Signed)
 Managed with pantoprazole  40 mg daily, continue, well-controlled with no new symptoms.

## 2024-04-05 NOTE — Assessment & Plan Note (Signed)
 Chronic low back pain on left side, stable with Gabapentin  900 mg at night.Continue.

## 2024-04-05 NOTE — Progress Notes (Signed)
 Established Patient Office Visit   Subjective  Patient ID: John Keith, male    DOB: [birth year]  Age: 73 y.o. MRN: [id number]  Chief Complaint  Patient presents with   Gastroesophageal Reflux   Hyperlipidemia    He  has a past medical history of Abnormal x-ray of neck (06/16/2021), Acute encephalopathy (12/18/2023), Allergy, Arthritis, Atrial flutter (HCC), Bilateral carpal tunnel syndrome (01/04/2024), Carpal tunnel syndrome on left, Chest pain of uncertain etiology (10/24/2019), Constipation (03/10/2016), COVID-19, COVID-19 virus detected (07/12/2019), DDD (degenerative disc disease), thoracolumbar, Degenerative joint disease of left shoulder, Diverticulosis, ED (erectile dysfunction), External hemorrhoid (03/07/2015), Fatigue (06/03/2016), GERD (gastroesophageal reflux disease), H. pylori infection, Hyperlipidemia, Hypertension, Insomnia, Lactose intolerance, Low back pain, PVC (premature ventricular contraction) (10/24/2019), Shingles, Smoking, Tobacco abuse (11/06/2012), Toe fracture, left, and Vitamin D  deficiency.  HPI Discussed the use of AI scribe software for clinical note transcription with the patient, who gave verbal consent to proceed.  History of Present Illness John Keith is a 73 year old male with sarcoidosis and heart involvement who presents for follow-up.   He was last seen by me on 01/04/24 for hospital follow up. Since his last office visit patient has been doing well. He had low Ca, Na, elevated platelets, ferritin, mild anemia during his hospital stay. Repeat labs showed normal sodium, calcium , normal platelets. He was prescribed Ketoconazole  for seborrhoic dermatitis, which is resolved now and does not need Ketoconazole  shampoo anymore.   [facility] on 02/23/24 for neck pain, was treated with short course of steroid and participated in home exercise. Resolved neck pain. Not on steroid anymore.   Lower back pain on left side for which he takes Gabapentin   900 mg (300 mg capsule, 3 capsules at night). No s/e, tolerating medication okay.   He has a h/o GERD, takes Protonix  40 mg daily.  He is under the care of a rheumatologist at [facility] and is scheduled to follow up in December. He is currently taking methotrexate  once a week on Wednesdays and folic acid  to prevent low folic acid  levels due to methotrexate  use.   H/o ventricular tachycardia with defibrillator in place, last interrogation was on 03/11/24. No chest pain, palpations, shortness of breath.   He has a history of smoking but quit in 2021. He has undergone a low-dose CT scan of his lungs in December 2022 to screen for lung cancer, which is recommended annually due to his smoking history. No current cough or wheezing. He underwent abdomin and pelvis CT on 12/21/2023 when he was found to have, Patchy ground-glass and tree-in-bud nodular opacities in the left lower lobe may be infectious/inflammatory. No cough, wheezing. He is okay with repeating low dose lung cancer CT screening. He is not seeing a pulmonologist at this time.   He mentions a recurring issue with swelling and previous intervention for right index finger pain, redness.  He notes that this has occurred on multiple occasions and is concerned about the swelling and potential infection.     ROS As per HPI    Objective:     BP 122/78 (BP Location: Right Arm, Patient Position: Sitting, Cuff Size: Normal)   Pulse 72   Temp 97.9 F (36.6 C) (Oral)   Ht 6' 9 (2.057 m)   Wt 241 lb (109.3 kg)   SpO2 97%   BMI 25.83 kg/m      04/05/2024    8:16 AM 01/04/2024    3:48 PM 08/29/2023    8:03 AM  Depression screen PHQ 2/9  Decreased Interest 0 0 0  Down, Depressed, Hopeless 0 0 0  PHQ - 2 Score 0 0 0  Altered sleeping 0 0 0  Tired, decreased energy 0 0 0  Change in appetite 0 0 1  Feeling bad or failure about yourself  0 0 0  Trouble concentrating 0 0 0  Moving slowly or fidgety/restless 0 0 0  Suicidal  thoughts 0 0 0  PHQ-9 Score 0 0 1  Difficult doing work/chores Not difficult at all Not difficult at all Not difficult at all      04/05/2024    8:17 AM 01/04/2024    3:48 PM 08/29/2023    8:03 AM 01/31/2023    8:21 AM  GAD 7 : Generalized Anxiety Score  Nervous, Anxious, on Edge 0 0 0 0  Control/stop worrying 0 0 0 0  Worry too much - different things 0 0 0 0  Trouble relaxing 0 0 0 0  Restless 0 0 0 0  Easily annoyed or irritable 0 0 0 0  Afraid - awful might happen 0 0 0 0  Total GAD 7 Score 0 0 0 0  Anxiety Difficulty Not difficult at all Not difficult at all Not difficult at all Not difficult at all      04/05/2024    8:16 AM 01/04/2024    3:48 PM 08/29/2023    8:03 AM  Depression screen PHQ 2/9  Decreased Interest 0 0 0  Down, Depressed, Hopeless 0 0 0  PHQ - 2 Score 0 0 0  Altered sleeping 0 0 0  Tired, decreased energy 0 0 0  Change in appetite 0 0 1  Feeling bad or failure about yourself  0 0 0  Trouble concentrating 0 0 0  Moving slowly or fidgety/restless 0 0 0  Suicidal thoughts 0 0 0  PHQ-9 Score 0 0 1  Difficult doing work/chores Not difficult at all Not difficult at all Not difficult at all      04/05/2024    8:17 AM 01/04/2024    3:48 PM 08/29/2023    8:03 AM 01/31/2023    8:21 AM  GAD 7 : Generalized Anxiety Score  Nervous, Anxious, on Edge 0 0 0 0  Control/stop worrying 0 0 0 0  Worry too much - different things 0 0 0 0  Trouble relaxing 0 0 0 0  Restless 0 0 0 0  Easily annoyed or irritable 0 0 0 0  Afraid - awful might happen 0 0 0 0  Total GAD 7 Score 0 0 0 0  Anxiety Difficulty Not difficult at all Not difficult at all Not difficult at all Not difficult at all   SDOH Screenings   Food Insecurity: No Food Insecurity (12/26/2023)  Housing: Unknown (12/26/2023)  Transportation Needs: No Transportation Needs (12/26/2023)  Utilities: Not At Risk (12/26/2023)  Alcohol Screen: Low Risk  (11/22/2022)  Depression (PHQ2-9): Low Risk  (04/05/2024)  Financial  Resource Strain: Low Risk  (11/22/2022)  Physical Activity: Sufficiently Active (11/22/2022)  Social Connections: Patient Declined (12/18/2023)  Stress: No Stress Concern Present (11/22/2022)  Tobacco Use: Medium Risk (04/05/2024)     Physical Exam Constitutional:      General: He is not in acute distress. HENT:     Head: Normocephalic and atraumatic.     Mouth/Throat:     Mouth: Mucous membranes are moist.  Eyes:     Conjunctiva/sclera: Conjunctivae normal.  Cardiovascular:  Rate and Rhythm: Normal rate.  Pulmonary:     Effort: Pulmonary effort is normal.     Breath sounds: Normal breath sounds. No wheezing.  Abdominal:     Tenderness: There is no abdominal tenderness. There is no guarding.  Musculoskeletal:     Cervical back: Neck supple. No rigidity.     Right lower leg: No edema.     Left lower leg: No edema.     Comments: Right index finger: Nailbed mild swelling without obvious warmth, tenderness to touch, no visible abscess, drainage.    Skin:    General: Skin is warm.  Neurological:     Mental Status: He is alert and oriented to person, place, and time.  Psychiatric:        Mood and Affect: Mood normal.        No results found for any visits on 04/05/24.  The ASCVD Risk score (Arnett DK, et al., 2019) failed to calculate for the following reasons:   Risk score cannot be calculated because patient has a medical history suggesting prior/existing ASCVD     Assessment & Plan:  Recommend patient schedule an appointment if worsening right index finger swelling, redness, discharge. He can try soaking hands in warm water twice a day, trial of diclofenac  1% gel to help with pain, swelling. Avoid puncturing or picking at the nail.   Neuropathy -     Gabapentin ; Take 3 capsules (900 mg total) by mouth at bedtime.  Dispense: 270 capsule; Refill: 1  Gastroesophageal reflux disease without esophagitis Assessment & Plan: Managed with pantoprazole  40 mg daily, continue,  well-controlled with no new symptoms.  Orders: -     Pantoprazole  Sodium; Take 1 tablet (40 mg total) by mouth daily. 30 min before food  Dispense: 30 tablet; Refill: 6  Lumbar radiculopathy Assessment & Plan: Chronic low back pain on left side, stable with Gabapentin  900 mg at night.Continue.     Encounter for screening for lung cancer Assessment & Plan: Qualifies for low dose Ct lung cancer screening. Referral made today.  CT abdomen pelvis done on 12/21/23: Patchy ground-glass and tree-in-bud nodular opacities in the left lower lobe may be infectious/inflammatory. If abnormal CT lungs he will benefit from pulmonology evaluation even though he is asymptomatic given h/o sarcoidosis on Methotrexate  and abnormal previous CT finding. Pending result for further management.     Orders: -     CT CHEST LUNG CANCER SCREENING LOW DOSE WO CONTRAST; Future  Elevated ferritin Assessment & Plan: Repeat CBC, iron panel. Likely secondary to inflammatory process.   Orders: -     CBC with Differential/Platelet; Future -     Iron, TIBC and Ferritin Panel; Future  Secondary hypercoagulable state Assessment & Plan: On Eliquis5 mg BID. No new concerns. F/U with cardiologist as scheduled in December.    Cardiac sarcoidosis Assessment & Plan: On Methotrexate  20 mg every Wednesday, management per cardiology and rheumatologist at [facility].      Return for 06/18/24 .   Luke Shade, MD

## 2024-04-09 ENCOUNTER — Ambulatory Visit: Admitting: *Deleted

## 2024-04-09 VITALS — Ht >= 80 in | Wt 234.2 lb

## 2024-04-09 DIAGNOSIS — Z0000 Encounter for general adult medical examination without abnormal findings: Secondary | ICD-10-CM | POA: Diagnosis not present

## 2024-04-09 NOTE — Progress Notes (Signed)
 Subjective:   John Keith is a 73 y.o. who presents for a Medicare Wellness preventive visit.  As a reminder, Annual Wellness Visits don't include a physical exam, and some assessments may be limited, especially if this visit is performed virtually. We may recommend an in-person follow-up visit with your provider if needed.  Visit Complete: Virtual I connected with  John Keith on 04/09/24 by a audio enabled telemedicine application and verified that I am speaking with the correct person using two identifiers.  Patient Location: Home  Provider Location: Home Office  I discussed the limitations of evaluation and management by telemedicine. The patient expressed understanding and agreed to proceed.  Vital Signs: Because this visit was a virtual/telehealth visit, some criteria may be missing or patient reported. Any vitals not documented were not able to be obtained and vitals that have been documented are patient reported.  VideoDeclined- This patient declined Librarian, academic. Therefore the visit was completed with audio only.  Persons Participating in Visit: Patient.  AWV Questionnaire: No: Patient Medicare AWV questionnaire was not completed prior to this visit.  Cardiac Risk Factors include: advanced age (>61men, >6 women);dyslipidemia;hypertension;male gender;Other (see comment), Risk factor comments: CAD, Pressence of heart assist device     Objective:    Today's Vitals   04/09/24 0840  Weight: 234 lb 4 oz (106.3 kg)  Height: 6' 9 (2.057 m)   Body mass index is 25.1 kg/m.     04/09/2024    8:52 AM 12/18/2023    5:08 PM 12/18/2023   11:20 AM 11/22/2022    9:40 AM 12/08/2021    2:46 PM 10/19/2021    8:45 AM 08/12/2021    8:24 AM  Advanced Directives  Does Patient Have a Medical Advance Directive? No No No No No No No  Would patient like information on creating a medical advance directive? No - Patient declined No - Patient declined  No  - Patient declined No - Patient declined No - Patient declined No - Patient declined    Current Medications (verified) Outpatient Encounter Medications as of 04/09/2024  Medication Sig   apixaban  (ELIQUIS ) 5 MG TABS tablet TAKE 1 TABLET(5 MG) BY MOUTH TWICE DAILY   calcium  carbonate (OS-CAL - DOSED IN MG OF ELEMENTAL CALCIUM ) 1250 (500 Ca) MG tablet Take 1 tablet (500 mg of elemental calcium  total) by mouth daily with breakfast.   carvedilol  (COREG ) 6.25 MG tablet TAKE 1 TABLET(6.25 MG) BY MOUTH TWICE DAILY WITH A MEAL   Cholecalciferol  (VITAMIN D -3) 125 MCG (5000 UT) TABS Take 1 tablet by mouth daily.   ezetimibe  (ZETIA ) 10 MG tablet Take 1 tablet (10 mg total) by mouth daily.   FARXIGA  10 MG TABS tablet Take 1 tablet (10 mg total) by mouth daily.   ferrous sulfate  325 (65 FE) MG tablet Take 325 mg by mouth daily with breakfast.   folic acid  (FOLVITE ) 1 MG tablet Take 1 tablet (1 mg total) by mouth daily.   gabapentin  (NEURONTIN ) 300 MG capsule Take 3 capsules (900 mg total) by mouth at bedtime.   methotrexate  (RHEUMATREX) 2.5 MG tablet TAKE 20MG (8 TABLETS) EVERY WEDNESDAY. PROTECT FROM LIGHT AS DIRECTED   Multiple Vitamin (MULTIVITAMIN) tablet Take 1 tablet by mouth daily.   pantoprazole  (PROTONIX ) 40 MG tablet Take 1 tablet (40 mg total) by mouth daily. 30 min before food   sacubitril -valsartan  (ENTRESTO ) 24-26 MG Take 1 tablet by mouth 2 (two) times daily.   spironolactone  (ALDACTONE ) 25 MG tablet  Take 1 tablet (25 mg total) by mouth daily.   No facility-administered encounter medications on file as of 04/09/2024.    Allergies (verified) Crestor  [rosuvastatin  calcium ] and Lipitor [atorvastatin  calcium ]   History: Past Medical History:  Diagnosis Date   Abnormal x-ray of neck 06/16/2021   Acute encephalopathy 12/18/2023   Allergy    Arthritis    Atrial flutter (HCC)    Bilateral carpal tunnel syndrome 01/04/2024   Carpal tunnel syndrome on left    Chest pain of uncertain  etiology 10/24/2019   Constipation 03/10/2016   COVID-19    07/07/19   COVID-19 virus detected 07/12/2019   DDD (degenerative disc disease), thoracolumbar    multilevel   Degenerative joint disease of left shoulder    02/2011    Diverticulosis    left colon (2008)    ED (erectile dysfunction)    02/2011    External hemorrhoid 03/07/2015   Fatigue 06/03/2016   GERD (gastroesophageal reflux disease)    H. pylori infection    Hyperlipidemia    Hypertension    Insomnia    Lactose intolerance    Low back pain    PVC (premature ventricular contraction) 10/24/2019   Shingles    07/2018   Smoking    smoking since age 58 y.o, quit in 2023   Tobacco abuse 11/06/2012   Toe fracture, left    4th in 2014   Vitamin D  deficiency    Past Surgical History:  Procedure Laterality Date   APPENDECTOMY     BACK SURGERY     x 2 10991 and 1995 in [place]    CARDIAC CATHETERIZATION     1999 nl   CARDIOVERSION N/A 10/19/2021   Procedure: CARDIOVERSION;  Surgeon: Santo Stanly LABOR, MD;  Location: [facility];  Service: Cardiovascular;  Laterality: N/A;   CARPAL TUNNEL RELEASE Left    COLONOSCOPY     2008 diverticulosis   ESOPHAGOGASTRODUODENOSCOPY     h/o +h. pylori   ICD IMPLANT N/A 12/10/2021   Procedure: ICD IMPLANT;  Surgeon: [name], MD;  Location: [facility] INVASIVE CV LAB;  Service: Cardiovascular;  Laterality: N/A;   LEFT HEART CATH AND CORONARY ANGIOGRAPHY N/A 12/08/2021   Procedure: LEFT HEART CATH AND CORONARY ANGIOGRAPHY;  Surgeon: Dann Candyce RAMAN, MD;  Location: [facility] INVASIVE CV LAB;  Service: Cardiovascular;  Laterality: N/A;   OTHER SURGICAL HISTORY     heart catherization 1999 normal    SPINE SURGERY     x 2    Family History  Problem Relation Age of Onset   Heart disease Mother        age 80    Throat cancer Father 21   Cancer Father        throat    Cancer Maternal Uncle        uncle ?maternal or paternal died colon cancer in his 88s    Diabetes Maternal  Aunt    Heart attack Maternal Grandmother    Social History   Socioeconomic History   Marital status: Married    Spouse name: Gwendolyn   Number of children: 2   Years of education: Not on file   Highest education level: High school graduate  Occupational History   Occupation: Retired    Comment: Semi  Tobacco Use   Smoking status: Former    Current packs/day: 0.00    Average packs/day: 0.5 packs/day for 49.0 years (24.5 ttl pk-yrs)    Types: Cigarettes    Start date:  07/06/1971    Quit date: 06/26/2020    Years since quitting: 3.7   Smokeless tobacco: Never   Tobacco comments:    Former smoker 09/24/21  Vaping Use   Vaping status: Never Used  Substance and Sexual Activity   Alcohol use: No    Alcohol/week: 0.0 standard drinks of alcohol   Drug use: No   Sexual activity: Not on file  Other Topics Concern   Not on file  Social History Narrative   Married    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    On child    1 son, 1 daughter   Machine op   Owns guns    Wears seat belt, safe in relationship    Smoker    Retired 06/27/2019   Social Drivers of Corporate investment banker Strain: Low Risk  (04/09/2024)   Overall Financial Resource Strain (CARDIA)    Difficulty of Paying Living Expenses: Not hard at all  Food Insecurity: No Food Insecurity (04/09/2024)   Hunger Vital Sign    Worried About Running Out of Food in the Last Year: Never true    Ran Out of Food in the Last Year: Never true  Transportation Needs: No Transportation Needs (04/09/2024)   PRAPARE - Administrator, Civil Service (Medical): No    Lack of Transportation (Non-Medical): No  Physical Activity: Inactive (04/09/2024)   Exercise Vital Sign    Days of Exercise per Week: 0 days    Minutes of Exercise per Session: 0 min  Stress: No Stress Concern Present (04/09/2024)   [employer] of Occupational Health - Occupational Stress Questionnaire    Feeling of Stress: Not at all  Social Connections: Moderately  Integrated (04/09/2024)   Social Connection and Isolation Panel    Frequency of Communication with Friends and Family: Once a week    Frequency of Social Gatherings with Friends and Family: More than three times a week    Attends Religious Services: Never    Database administrator or Organizations: No    Attends Engineer, structural: More than 4 times per year    Marital Status: Married    Tobacco Counseling Counseling given: Not Answered Tobacco comments: Former smoker 09/24/21    Clinical Intake:  Pre-visit preparation completed: Yes  Pain : No/denies pain     BMI - recorded: 25.1 Nutritional Status: BMI 25 -29 Overweight Nutritional Risks: None Diabetes: No  Lab Results  Component Value Date   HGBA1C 5.6 10/29/2022   HGBA1C 5.4 12/15/2021   HGBA1C 5.6 02/26/2021     How often do you need to have someone help you when you read instructions, pamphlets, or other written materials from your doctor or pharmacy?: 1 - Never  Interpreter Needed?: No  Information entered by :: R. Orren Pietsch LPN   Activities of Daily Living     04/09/2024    8:42 AM 12/18/2023    5:08 PM  In your present state of health, do you have any difficulty performing the following activities:  Hearing? 0 0  Vision? 0 0  Difficulty concentrating or making decisions? 0 1  Walking or climbing stairs? 0   Dressing or bathing? 0   Doing errands, shopping? 0 1  Preparing Food and eating ? N   Using the Toilet? N   In the past six months, have you accidently leaked urine? N   Do you have problems with loss of bowel control? N   Managing your Medications? N  Managing your Finances? N   Housekeeping or managing your Housekeeping? N     Patient Care Team: Bair, Kalpana, MD as PCP - General (Family Medicine) [name], MD as PCP - Electrophysiology (Cardiology) [name], [name], MD as PCP - Cardiology (Cardiology) [name], MD as Consulting Physician (Internal  Medicine) [name], MD as Attending Physician (Cardiology) [name], MD as Consulting Physician (Cardiology)  I have updated your Care Teams any recent Medical Services you may have received from other providers in the past year.     Assessment:   This is a routine wellness examination for John Keith.  Hearing/Vision screen Hearing Screening - Comments:: No issues Vision Screening - Comments:: glasses   Goals Addressed             This Visit's Progress    Patient Stated       Wants to stay active and continue to eat well       Depression Screen     04/09/2024    8:49 AM 04/05/2024    8:16 AM 01/04/2024    3:48 PM 08/29/2023    8:03 AM 01/31/2023    8:20 AM 11/22/2022    9:34 AM 01/28/2022    1:12 PM  PHQ 2/9 Scores  PHQ - 2 Score 0 0 0 0 0 0 0  PHQ- 9 Score 0 0 0 1 3      Fall Risk     04/09/2024    8:45 AM 04/05/2024    8:16 AM 01/04/2024    3:48 PM 08/29/2023    8:03 AM 01/31/2023    8:20 AM  Fall Risk   Falls in the past year? 0 0 0 0 0  Number falls in past yr: 0 0 0 0 0  Injury with Fall? 0 0 0 0 0  Risk for fall due to : No Fall Risks No Fall Risks No Fall Risks No Fall Risks No Fall Risks  Follow up Falls evaluation completed Falls evaluation completed Falls evaluation completed Falls evaluation completed;Education provided Falls evaluation completed    MEDICARE RISK AT HOME:  Medicare Risk at Home Any stairs in or around the home?: No If so, are there any without handrails?: No Home free of loose throw rugs in walkways, pet beds, electrical cords, etc?: Yes Adequate lighting in your home to reduce risk of falls?: Yes Life alert?: No Use of a cane, walker or w/c?: No Grab bars in the bathroom?: No Shower chair or bench in shower?: Yes Elevated toilet seat or a handicapped toilet?: Yes  TIMED UP AND GO:  Was the test performed?  No  Cognitive Function: 6CIT completed        04/09/2024    8:53 AM 11/22/2022    9:37 AM 08/12/2021     8:40 AM 08/11/2020    8:52 AM 08/09/2019    8:54 AM  6CIT Screen  What Year? 0 points 0 points 0 points 0 points 0 points  What month? 0 points 0 points 0 points 0 points 0 points  What time? 0 points 0 points 0 points 0 points 0 points  Count back from 20 0 points 0 points 0 points 0 points 0 points  Months in reverse 0 points 0 points 0 points 0 points 0 points  Repeat phrase 0 points 2 points 0 points  0 points  Total Score 0 points 2 points 0 points  0 points    Immunizations Immunization History  Administered  Date(s) Administered   Fluad Quad(high Dose 65+) 02/27/2021, 04/05/2022, 04/04/2023   Fluad Trivalent(High Dose 65+) 02/28/2024   INFLUENZA, HIGH DOSE SEASONAL PF 03/28/2019, 04/04/2023   Influenza Inj Mdck Quad Pf 03/27/2018   Influenza-Unspecified 05/07/2019, 04/25/2020, 03/05/2021   Moderna Covid-19 Fall Seasonal Vaccine 83yrs & older 09/29/2023, 04/03/2024   PFIZER Comirnaty(Gray Top)Covid-19 Tri-Sucrose Vaccine 11/10/2020   PFIZER(Purple Top)SARS-COV-2 Vaccination 10/04/2019, 10/29/2019, 05/08/2020, 11/10/2020, 04/13/2021   Pfizer Covid-19 Vaccine Bivalent Booster 97yrs & up 04/13/2021, 04/12/2022, 04/04/2023   Pfizer(Comirnaty)Fall Seasonal Vaccine 12 years and older 04/12/2022, 04/04/2023   Pneumococcal Conjugate-13 06/03/2016   Pneumococcal Polysaccharide-23 02/06/2015   Respiratory Syncytial Virus Vaccine,Recomb Aduvanted(Arexvy) 04/03/2024   Tdap 06/15/2013   Zoster Recombinant(Shingrix ) 06/07/2019, 01/09/2024, 03/16/2024    Screening Tests Health Maintenance  Topic Date Due   Lung Cancer Screening  06/11/2022   DTaP/Tdap/Td (2 - Td or Tdap) 06/16/2023   Medicare Annual Wellness (AWV)  11/22/2023   COVID-19 Vaccine (11 - 2025-26 season) 05/29/2024   Colonoscopy  12/18/2026   Pneumococcal Vaccine: 50+ Years  Completed   Influenza Vaccine  Completed   Hepatitis C Screening  Completed   Zoster Vaccines- Shingrix   Completed   Meningococcal B Vaccine  Aged Out     Health Maintenance Items Addressed: Discussed the need to update tetanus vaccine.  Patient is aware a Lung cancer screening order was placed 04/05/24 telephone number provided.  Additional Screening:  Vision Screening: Recommended annual ophthalmology exams for early detection of glaucoma and other disorders of the eye. Is the patient up to date with their annual eye exam?  Yes  Who is the provider or what is the name of the office in which the patient attends annual eye exams?  [facility]  Dental Screening: Recommended annual dental exams for proper oral hygiene  Community Resource Referral / Chronic Care Management: CRR required this visit?  No   CCM required this visit?  No   Plan:    I have personally reviewed and noted the following in the patient's chart:   Medical and social history Use of alcohol, tobacco or illicit drugs  Current medications and supplements including opioid prescriptions. Patient is not currently taking opioid prescriptions. Functional ability and status Nutritional status Physical activity Advanced directives List of other physicians Hospitalizations, surgeries, and ER visits in previous 12 months Vitals Screenings to include cognitive, depression, and falls Referrals and appointments  In addition, I have reviewed and discussed with patient certain preventive protocols, quality metrics, and best practice recommendations. A written personalized care plan for preventive services as well as general preventive health recommendations were provided to patient.   Angeline Fredericks, LPN   [id number]/09/[id number]   After Visit Summary: (MyChart) Due to this being a telephonic visit, the after visit summary with patients personalized plan was offered to patient via MyChart   Notes: Nothing significant to report at this time.

## 2024-04-09 NOTE — Patient Instructions (Signed)
 Mr. John Keith,  Thank you for taking the time for your Medicare Wellness Visit. I appreciate your continued commitment to your health goals. Please review the care plan we discussed, and feel free to reach out if I can assist you further.  Medicare recommends these wellness visits once per year to help you and your care team stay ahead of potential health issues. These visits are designed to focus on prevention, allowing your provider to concentrate on managing your acute and chronic conditions during your regular appointments.  Please note that Annual Wellness Visits do not include a physical exam. Some assessments may be limited, especially if the visit was conducted virtually. If needed, we may recommend a separate in-person follow-up with your provider.  Ongoing Care Seeing your primary care provider every 3 to 6 months helps us  monitor your health and provide consistent, personalized care.  Remember to update your tetanus vaccine at your pharmacy.  If you do not hear anything regarding your lung cancer screening call the number provided.   Referrals If a referral was made during today's visit and you haven't received any updates within two weeks, please contact the referred provider directly to check on the status.  Recommended Screenings:  Health Maintenance  Topic Date Due   Screening for Lung Cancer  06/11/2022   DTaP/Tdap/Td vaccine (2 - Td or Tdap) 06/16/2023   COVID-19 Vaccine (11 - 2025-26 season) 05/29/2024   Medicare Annual Wellness Visit  04/09/2025   Colon Cancer Screening  12/18/2026   Pneumococcal Vaccine for age over 27  Completed   Flu Shot  Completed   Hepatitis C Screening  Completed   Zoster (Shingles) Vaccine  Completed   Meningitis B Vaccine  Aged Out       04/09/2024    8:52 AM  Advanced Directives  Does Patient Have a Medical Advance Directive? No  Would patient like information on creating a medical advance directive? No - Patient declined   Advance  Care Planning is important because it: Ensures you receive medical care that aligns with your values, goals, and preferences. Provides guidance to your family and loved ones, reducing the emotional burden of decision-making during critical moments.  Vision: Annual vision screenings are recommended for early detection of glaucoma, cataracts, and diabetic retinopathy. These exams can also reveal signs of chronic conditions such as diabetes and high blood pressure.  Dental: Annual dental screenings help detect early signs of oral cancer, gum disease, and other conditions linked to overall health, including heart disease and diabetes.  Please see the attached documents for additional preventive care recommendations.

## 2024-04-12 ENCOUNTER — Other Ambulatory Visit (HOSPITAL_COMMUNITY): Payer: Self-pay

## 2024-04-12 NOTE — Progress Notes (Signed)
 Paramedicine Encounter    Patient ID: John Keith, male    DOB: 22-Apr-1951, 73 y.o.   MRN: [id number]   Complaints- none   Assessment- CAOx4 warm and dry seated in his dining room with out concerns, no shortness of breath, no chest pain, no dizziness. Lungs clear, no lower leg swelling. Vitals within normal limits.   Compliance with meds- no missed doses   Pill box filled- for two weeks   Refills needed- spironolactone    Meds changes since last visit- none     Social changes- Lung CT Screening at [facility] October 23 at 9:15- [address] (admissions) [phone]    VISIT SUMMARY- Arrived for home visit for John Keith who reports feeling well without complaints. Vitals and assessment obtained. No recent symptoms or concerns. Meds reviewed and confirmed. Pill box filled for two weeks. Refills will be called into [facility]. I reviewed PCP visit note and was able to assist him in getting his Lung CT screening scheduled. HF education provided. Appointments reviewed. Home visit complete. I will see John Keith in two weeks.   BP 122/70   Pulse 72   Resp 18   Wt 235 lb (106.6 kg)   SpO2 95%   BMI 25.18 kg/m  Weight yesterday-- 234lbs  Last visit weight-- 235lbs      ACTION: Home visit completed     Patient Care Team: Abbey Bruckner, MD as PCP - General (Family Medicine) [name], MD as PCP - Electrophysiology (Cardiology) [name], [name], MD as PCP - Cardiology (Cardiology) [name], MD as Consulting Physician (Internal Medicine) [name], MD as Attending Physician (Cardiology) [name], MD as Consulting Physician (Cardiology)  Patient Active Problem List   Diagnosis Date Noted   Cardiac sarcoidosis 01/04/2024   Primary osteoarthritis 01/04/2024   Presence of heart assist device (HCC) 01/04/2024   On anticoagulant therapy 08/29/2023   On amiodarone  therapy 08/29/2023   Onychomycosis 01/31/2023   Statin myopathy 11/15/2022    Encounter for screening for lung cancer 11/15/2022   Chronic systolic heart failure (HCC) 12/15/2021   Chronic combined systolic and diastolic CHF (congestive heart failure) (HCC) 12/15/2021   Ventricular tachycardia (HCC) 12/07/2021   Typical atrial flutter (HCC) 09/24/2021   Secondary hypercoagulable state 09/24/2021   Abnormal MRI, lumbar spine 10/23/2020   Chronic low back pain 10/23/2020   Coronary artery disease of native artery of native heart with stable angina pectoris 10/23/2020   Impingement syndrome of left shoulder region 08/22/2020   Pain in joint of left shoulder 07/24/2020   Chronic left shoulder pain 07/11/2020   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   Normocytic normochromic anemia 12/13/2019   PVC (premature ventricular contraction) 10/24/2019   Ventricular premature beats 10/24/2019   Elevated ferritin 07/07/2019   Postherpetic neuralgia 08/17/2018   Benign prostatic hyperplasia 08/01/2018   Hip pain 06/29/2018   Aortic atherosclerosis 09/22/2016   Lower urinary tract symptoms (LUTS) 06/03/2016   Lumbar radiculopathy 02/20/2016   Insomnia 06/10/2015   Health care maintenance 01/09/2014   Hyperlipidemia 11/09/2012   Essential hypertension 11/06/2012   Seasonal allergies 11/06/2012   Erectile dysfunction 11/06/2012   GERD (gastroesophageal reflux disease) 11/06/2012   Lactose intolerance 11/06/2012   Diverticulosis of colon without hemorrhage 11/06/2012   Degeneration of thoracolumbar intervertebral disc 11/06/2012    Current Outpatient Medications:    apixaban  (ELIQUIS ) 5 MG TABS tablet, TAKE 1 TABLET(5 MG) BY MOUTH TWICE DAILY, Disp: 60 tablet, Rfl: 11   calcium  carbonate (OS-CAL - DOSED  IN MG OF ELEMENTAL CALCIUM ) 1250 (500 Ca) MG tablet, Take 1 tablet (500 mg of elemental calcium  total) by mouth daily with breakfast., Disp: 30 tablet, Rfl: 11   carvedilol  (COREG ) 6.25 MG tablet, TAKE 1 TABLET(6.25 MG) BY MOUTH TWICE DAILY WITH A MEAL, Disp: 180 tablet, Rfl:  3   Cholecalciferol  (VITAMIN D -3) 125 MCG (5000 UT) TABS, Take 1 tablet by mouth daily., Disp: 30 tablet, Rfl: 11   ezetimibe  (ZETIA ) 10 MG tablet, Take 1 tablet (10 mg total) by mouth daily., Disp: 90 tablet, Rfl: 3   FARXIGA  10 MG TABS tablet, Take 1 tablet (10 mg total) by mouth daily., Disp: 90 tablet, Rfl: 3   ferrous sulfate  325 (65 FE) MG tablet, Take 325 mg by mouth daily with breakfast., Disp: , Rfl:    folic acid  (FOLVITE ) 1 MG tablet, Take 1 tablet (1 mg total) by mouth daily., Disp: 30 tablet, Rfl: 6   gabapentin  (NEURONTIN ) 300 MG capsule, Take 3 capsules (900 mg total) by mouth at bedtime., Disp: 270 capsule, Rfl: 1   methotrexate  (RHEUMATREX) 2.5 MG tablet, TAKE 20MG (8 TABLETS) EVERY WEDNESDAY. PROTECT FROM LIGHT AS DIRECTED, Disp: 150 tablet, Rfl: 0   Multiple Vitamin (MULTIVITAMIN) tablet, Take 1 tablet by mouth daily., Disp: , Rfl:    pantoprazole  (PROTONIX ) 40 MG tablet, Take 1 tablet (40 mg total) by mouth daily. 30 min before food, Disp: 30 tablet, Rfl: 6   sacubitril -valsartan  (ENTRESTO ) 24-26 MG, Take 1 tablet by mouth 2 (two) times daily., Disp: 180 tablet, Rfl: 3   spironolactone  (ALDACTONE ) 25 MG tablet, Take 1 tablet (25 mg total) by mouth daily., Disp: 90 tablet, Rfl: 3 Allergies  Allergen Reactions   Crestor  [Rosuvastatin  Calcium ] Other (See Comments)    Elevated muscle enzymes    Lipitor [Atorvastatin  Calcium ] Other (See Comments)    Elevated muscle enzymes      Social History   Socioeconomic History   Marital status: Married    Spouse name: Gwendolyn   Number of children: 2   Years of education: Not on file   Highest education level: High school graduate  Occupational History   Occupation: Retired    Comment: Semi  Tobacco Use   Smoking status: Former    Current packs/day: 0.00    Average packs/day: 0.5 packs/day for 49.0 years (24.5 ttl pk-yrs)    Types: Cigarettes    Start date: 07/06/1971    Quit date: 06/26/2020    Years since quitting: 3.7    Smokeless tobacco: Never   Tobacco comments:    Former smoker 09/24/21  Vaping Use   Vaping status: Never Used  Substance and Sexual Activity   Alcohol use: No    Alcohol/week: 0.0 standard drinks of alcohol   Drug use: No   Sexual activity: Not on file  Other Topics Concern   Not on file  Social History Narrative   Married    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    On child    1 son, 1 daughter   Machine op   Owns guns    Wears seat belt, safe in relationship    Smoker    Retired 06/27/2019   Social Drivers of Corporate investment banker Strain: Low Risk  (04/09/2024)   Overall Financial Resource Strain (CARDIA)    Difficulty of Paying Living Expenses: Not hard at all  Food Insecurity: No Food Insecurity (04/09/2024)   Hunger Vital Sign    Worried About Running Out of Food in the Last Year:  Never true    Ran Out of Food in the Last Year: Never true  Transportation Needs: No Transportation Needs (04/09/2024)   PRAPARE - Administrator, Civil Service (Medical): No    Lack of Transportation (Non-Medical): No  Physical Activity: Inactive (04/09/2024)   Exercise Vital Sign    Days of Exercise per Week: 0 days    Minutes of Exercise per Session: 0 min  Stress: No Stress Concern Present (04/09/2024)   [employer] of Occupational Health - Occupational Stress Questionnaire    Feeling of Stress: Not at all  Social Connections: Moderately Integrated (04/09/2024)   Social Connection and Isolation Panel    Frequency of Communication with Friends and Family: Once a week    Frequency of Social Gatherings with Friends and Family: More than three times a week    Attends Religious Services: Never    Database administrator or Organizations: No    Attends Engineer, structural: More than 4 times per year    Marital Status: Married  Catering manager Violence: Not At Risk (04/09/2024)   Humiliation, Afraid, Rape, and Kick questionnaire    Fear of Current or Ex-Partner: No    Emotionally  Abused: No    Physically Abused: No    Sexually Abused: No    Physical Exam      Future Appointments  Date Time Provider Department Center  05/24/2024  9:00 AM [place], [place] D, DPM [facility]  06/08/2024  7:00 AM [facility] HVT DEVICE REMOTES [facility]  06/18/2024  1:00 PM Bair, Luke, MD [facility] [address]  04/15/2025  8:10 AM [facility] ANNUAL WELLNESS VISIT [facility] [address]

## 2024-04-16 ENCOUNTER — Telehealth: Payer: Self-pay

## 2024-04-16 DIAGNOSIS — I10 Essential (primary) hypertension: Secondary | ICD-10-CM

## 2024-04-16 NOTE — Telephone Encounter (Signed)
 Paperwork has been placed in provider's folder for review.

## 2024-04-16 NOTE — Telephone Encounter (Signed)
 Patient was notified and made aware of Dr Graylon recommendations. Patient verbalized understanding.

## 2024-04-16 NOTE — Addendum Note (Signed)
 Addended by: Indie Nickerson on: 04/16/2024 12:51 PM   Modules accepted: Orders

## 2024-04-16 NOTE — Telephone Encounter (Signed)
 Pt dropped off lab paper for Dr Abbey to view. It is in her color folder up front

## 2024-04-16 NOTE — Telephone Encounter (Signed)
 Received lab order form for CMP and CBC for the patient from rheumatologist Dr. Lynwood JULIANNA Ramsay. Patient already has CBC ordered by me. I will add CMP  and get these labs during his upcoming appointment in 06/18/24. Results will be forwarded to D. Beekman as well.   1. Essential hypertension (Primary) - Comp Met (CMET); Future   John Shade, MD

## 2024-04-26 ENCOUNTER — Other Ambulatory Visit (HOSPITAL_COMMUNITY): Payer: Self-pay

## 2024-04-26 ENCOUNTER — Ambulatory Visit (HOSPITAL_COMMUNITY)
Admission: RE | Admit: 2024-04-26 | Discharge: 2024-04-26 | Disposition: A | Discharge status: Home / Self Care | Source: Ambulatory Visit

## 2024-04-26 DIAGNOSIS — J439 Emphysema, unspecified: Secondary | ICD-10-CM | POA: Insufficient documentation

## 2024-04-26 DIAGNOSIS — I77810 Thoracic aortic ectasia: Secondary | ICD-10-CM | POA: Diagnosis not present

## 2024-04-26 DIAGNOSIS — K5730 Diverticulosis of large intestine without perforation or abscess without bleeding: Secondary | ICD-10-CM | POA: Insufficient documentation

## 2024-04-26 DIAGNOSIS — Z122 Encounter for screening for malignant neoplasm of respiratory organs: Secondary | ICD-10-CM | POA: Insufficient documentation

## 2024-04-26 DIAGNOSIS — I2510 Atherosclerotic heart disease of native coronary artery without angina pectoris: Secondary | ICD-10-CM | POA: Diagnosis not present

## 2024-04-26 DIAGNOSIS — Z87891 Personal history of nicotine dependence: Secondary | ICD-10-CM | POA: Insufficient documentation

## 2024-04-26 DIAGNOSIS — I700 Atherosclerosis of aorta: Secondary | ICD-10-CM | POA: Insufficient documentation

## 2024-04-26 DIAGNOSIS — F17210 Nicotine dependence, cigarettes, uncomplicated: Secondary | ICD-10-CM | POA: Diagnosis not present

## 2024-04-26 NOTE — Progress Notes (Signed)
 Paramedicine Encounter    Patient ID: John Keith, male    DOB: 07-Sep-1950, 73 y.o.   MRN: [id number]   Complaints- intermittent dizziness for two days   Assessment- CAOX4, warm and dry complaining of intermittent dizziness for two days he says its not positional but thinks it could be his sinuses. Lungs clear, no edema, BP slightly elevated, no headaches, no shortness of breath.   Compliance with meds- no missed doses   Pill box filled- for two weeks   Refills needed- folic acid , farxiga , eliquis   Meds changes since last visit- none     Social changes- none    VISIT SUMMARY- Arrived for home visit for John Keith who reports to be feeling well but mentioned having some intermittent dizziness for two days- he denied it being positional. He says it is his sinuses and plans to take an allergy pill and if no relief he will let me know. No orthostatic changes. He keeps a daily log of BP's and weights and no major changes. No headaches, no shortness of breath, no chest pain. Vitals were obtained and as noted. He denied any other symptoms. Meds reviewed and pill box filled for two weeks. Home visit complete. I will see him in two weeks pending dizziness symptoms.   BP (!) 140/72   Pulse 74   Resp 16   Wt 236 lb (107 kg)   SpO2 96%   BMI 25.29 kg/m  Weight yesterday-- 237lbs  Last visit weight-- 235lbs      ACTION: Home visit completed     Patient Care Team: Abbey Bruckner, MD as PCP - General (Family Medicine) [name], MD as PCP - Electrophysiology (Cardiology) [name], [name], MD as PCP - Cardiology (Cardiology) [name], MD as Consulting Physician (Internal Medicine) [name], MD as Attending Physician (Cardiology) [name], MD as Consulting Physician (Cardiology)  Patient Active Problem List   Diagnosis Date Noted   Cardiac sarcoidosis 01/04/2024   Primary osteoarthritis 01/04/2024   Presence of heart assist device (HCC)  01/04/2024   On anticoagulant therapy 08/29/2023   On amiodarone  therapy 08/29/2023   Onychomycosis 01/31/2023   Statin myopathy 11/15/2022   Encounter for screening for lung cancer 11/15/2022   Chronic systolic heart failure (HCC) 12/15/2021   Chronic combined systolic and diastolic CHF (congestive heart failure) (HCC) 12/15/2021   Ventricular tachycardia (HCC) 12/07/2021   Typical atrial flutter (HCC) 09/24/2021   Secondary hypercoagulable state 09/24/2021   Abnormal MRI, lumbar spine 10/23/2020   Chronic low back pain 10/23/2020   Coronary artery disease of native artery of native heart with stable angina pectoris 10/23/2020   Impingement syndrome of left shoulder region 08/22/2020   Pain in joint of left shoulder 07/24/2020   Chronic left shoulder pain 07/11/2020   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   Normocytic normochromic anemia 12/13/2019   PVC (premature ventricular contraction) 10/24/2019   Ventricular premature beats 10/24/2019   Elevated ferritin 07/07/2019   Postherpetic neuralgia 08/17/2018   Benign prostatic hyperplasia 08/01/2018   Hip pain 06/29/2018   Aortic atherosclerosis 09/22/2016   Lower urinary tract symptoms (LUTS) 06/03/2016   Lumbar radiculopathy 02/20/2016   Insomnia 06/10/2015   Health care maintenance 01/09/2014   Hyperlipidemia 11/09/2012   Essential hypertension 11/06/2012   Seasonal allergies 11/06/2012   Erectile dysfunction 11/06/2012   GERD (gastroesophageal reflux disease) 11/06/2012   Lactose intolerance 11/06/2012   Diverticulosis of colon without hemorrhage 11/06/2012   Degeneration of thoracolumbar intervertebral disc 11/06/2012  Current Outpatient Medications:    apixaban  (ELIQUIS ) 5 MG TABS tablet, TAKE 1 TABLET(5 MG) BY MOUTH TWICE DAILY, Disp: 60 tablet, Rfl: 11   calcium  carbonate (OS-CAL - DOSED IN MG OF ELEMENTAL CALCIUM ) 1250 (500 Ca) MG tablet, Take 1 tablet (500 mg of elemental calcium  total) by mouth daily with breakfast.,  Disp: 30 tablet, Rfl: 11   carvedilol  (COREG ) 6.25 MG tablet, TAKE 1 TABLET(6.25 MG) BY MOUTH TWICE DAILY WITH A MEAL, Disp: 180 tablet, Rfl: 3   Cholecalciferol  (VITAMIN D -3) 125 MCG (5000 UT) TABS, Take 1 tablet by mouth daily., Disp: 30 tablet, Rfl: 11   ezetimibe  (ZETIA ) 10 MG tablet, Take 1 tablet (10 mg total) by mouth daily., Disp: 90 tablet, Rfl: 3   FARXIGA  10 MG TABS tablet, Take 1 tablet (10 mg total) by mouth daily., Disp: 90 tablet, Rfl: 3   ferrous sulfate  325 (65 FE) MG tablet, Take 325 mg by mouth daily with breakfast., Disp: , Rfl:    folic acid  (FOLVITE ) 1 MG tablet, Take 1 tablet (1 mg total) by mouth daily., Disp: 30 tablet, Rfl: 6   gabapentin  (NEURONTIN ) 300 MG capsule, Take 3 capsules (900 mg total) by mouth at bedtime., Disp: 270 capsule, Rfl: 1   methotrexate  (RHEUMATREX) 2.5 MG tablet, TAKE 20MG (8 TABLETS) EVERY WEDNESDAY. PROTECT FROM LIGHT AS DIRECTED, Disp: 150 tablet, Rfl: 0   Multiple Vitamin (MULTIVITAMIN) tablet, Take 1 tablet by mouth daily., Disp: , Rfl:    pantoprazole  (PROTONIX ) 40 MG tablet, Take 1 tablet (40 mg total) by mouth daily. 30 min before food, Disp: 30 tablet, Rfl: 6   sacubitril -valsartan  (ENTRESTO ) 24-26 MG, Take 1 tablet by mouth 2 (two) times daily., Disp: 180 tablet, Rfl: 3   spironolactone  (ALDACTONE ) 25 MG tablet, Take 1 tablet (25 mg total) by mouth daily., Disp: 90 tablet, Rfl: 3 Allergies  Allergen Reactions   Crestor  [Rosuvastatin  Calcium ] Other (See Comments)    Elevated muscle enzymes    Lipitor [Atorvastatin  Calcium ] Other (See Comments)    Elevated muscle enzymes      Social History   Socioeconomic History   Marital status: Married    Spouse name: Gwendolyn   Number of children: 2   Years of education: Not on file   Highest education level: High school graduate  Occupational History   Occupation: Retired    Comment: Semi  Tobacco Use   Smoking status: Former    Current packs/day: 0.00    Average packs/day: 0.5  packs/day for 49.0 years (24.5 ttl pk-yrs)    Types: Cigarettes    Start date: 07/06/1971    Quit date: 06/26/2020    Years since quitting: 3.8   Smokeless tobacco: Never   Tobacco comments:    Former smoker 09/24/21  Vaping Use   Vaping status: Never Used  Substance and Sexual Activity   Alcohol use: No    Alcohol/week: 0.0 standard drinks of alcohol   Drug use: No   Sexual activity: Not on file  Other Topics Concern   Not on file  Social History Narrative   Married    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    On child    1 son, 1 daughter   Machine op   Owns guns    Wears seat belt, safe in relationship    Smoker    Retired 06/27/2019   Social Drivers of Health   Financial Resource Strain: Low Risk  (04/09/2024)   Overall Financial Resource Strain (CARDIA)    Difficulty of  Paying Living Expenses: Not hard at all  Food Insecurity: No Food Insecurity (04/09/2024)   Hunger Vital Sign    Worried About Running Out of Food in the Last Year: Never true    Ran Out of Food in the Last Year: Never true  Transportation Needs: No Transportation Needs (04/09/2024)   PRAPARE - Administrator, Civil Service (Medical): No    Lack of Transportation (Non-Medical): No  Physical Activity: Inactive (04/09/2024)   Exercise Vital Sign    Days of Exercise per Week: 0 days    Minutes of Exercise per Session: 0 min  Stress: No Stress Concern Present (04/09/2024)   [employer] of Occupational Health - Occupational Stress Questionnaire    Feeling of Stress: Not at all  Social Connections: Moderately Integrated (04/09/2024)   Social Connection and Isolation Panel    Frequency of Communication with Friends and Family: Once a week    Frequency of Social Gatherings with Friends and Family: More than three times a week    Attends Religious Services: Never    Database administrator or Organizations: No    Attends Engineer, structural: More than 4 times per year    Marital Status: Married  Careers information officer Violence: Not At Risk (04/09/2024)   Humiliation, Afraid, Rape, and Kick questionnaire    Fear of Current or Ex-Partner: No    Emotionally Abused: No    Physically Abused: No    Sexually Abused: No    Physical Exam      Future Appointments  Date Time Provider Department Center  05/24/2024  9:00 AM [place]an, [place] D, DPM [facility]  06/08/2024  7:00 AM [facility] HVT DEVICE REMOTES [facility]  06/18/2024  1:00 PM Bair, Luke, MD [facility] [address]  04/15/2025  8:10 AM [facility] ANNUAL WELLNESS VISIT [facility] [address]

## 2024-04-30 ENCOUNTER — Other Ambulatory Visit (HOSPITAL_COMMUNITY): Payer: Self-pay

## 2024-05-01 ENCOUNTER — Ambulatory Visit: Payer: Self-pay

## 2024-05-01 DIAGNOSIS — K5730 Diverticulosis of large intestine without perforation or abscess without bleeding: Secondary | ICD-10-CM

## 2024-05-01 DIAGNOSIS — I2510 Atherosclerotic heart disease of native coronary artery without angina pectoris: Secondary | ICD-10-CM

## 2024-05-01 DIAGNOSIS — J439 Emphysema, unspecified: Secondary | ICD-10-CM | POA: Insufficient documentation

## 2024-05-01 DIAGNOSIS — I77810 Thoracic aortic ectasia: Secondary | ICD-10-CM | POA: Insufficient documentation

## 2024-05-01 DIAGNOSIS — K5790 Diverticulosis of intestine, part unspecified, without perforation or abscess without bleeding: Secondary | ICD-10-CM | POA: Insufficient documentation

## 2024-05-01 DIAGNOSIS — I700 Atherosclerosis of aorta: Secondary | ICD-10-CM

## 2024-05-01 NOTE — Progress Notes (Signed)
 Last mychart login 12/2023. Please call the patient to let him know I reviewed his lung CT done for lung cancer screening and recommend continuing annual low dose CT of lungs.   Imaging also showed: - mildly dilated artery coming off of heart which will be monitored with ct lungs as well.  - diverticulosis: condition where small pouches form in the wall of the colon. This is a common finding and often does not cause symptoms. To help manage this, I recommend maintaining a high-fiber diet, which can support colon health and reduce the risk of complications.  Thank you,  Luke Shade, MD

## 2024-05-10 ENCOUNTER — Telehealth (HOSPITAL_COMMUNITY): Payer: Self-pay

## 2024-05-10 ENCOUNTER — Encounter (HOSPITAL_BASED_OUTPATIENT_CLINIC_OR_DEPARTMENT_OTHER): Payer: Self-pay

## 2024-05-10 ENCOUNTER — Other Ambulatory Visit (HOSPITAL_COMMUNITY): Payer: Self-pay

## 2024-05-10 ENCOUNTER — Ambulatory Visit (HOSPITAL_BASED_OUTPATIENT_CLINIC_OR_DEPARTMENT_OTHER)
Admission: RE | Admit: 2024-05-10 | Discharge: 2024-05-10 | Disposition: A | Discharge status: Home / Self Care | Source: Ambulatory Visit | Attending: Family Medicine | Admitting: Family Medicine

## 2024-05-10 ENCOUNTER — Telehealth: Payer: Self-pay

## 2024-05-10 VITALS — BP 115/76 | HR 69 | Temp 97.9°F | Resp 18

## 2024-05-10 DIAGNOSIS — R410 Disorientation, unspecified: Secondary | ICD-10-CM | POA: Diagnosis not present

## 2024-05-10 DIAGNOSIS — R82998 Other abnormal findings in urine: Secondary | ICD-10-CM | POA: Diagnosis not present

## 2024-05-10 DIAGNOSIS — R42 Dizziness and giddiness: Secondary | ICD-10-CM | POA: Insufficient documentation

## 2024-05-10 LAB — POCT URINE DIPSTICK
Bilirubin, UA: NEGATIVE
Blood, UA: NEGATIVE
Glucose, UA: >=1000 mg/dL — AB
Ketones, POC UA: NEGATIVE mg/dL
Leukocytes, UA: NEGATIVE
Nitrite, UA: NEGATIVE
Protein Ur, POC: NEGATIVE mg/dL
Spec Grav, UA: 1.025 (ref 1.010–1.025)
Urobilinogen, UA: 0.2 U/dL
pH, UA: 5.5 (ref 5.0–8.0)

## 2024-05-10 NOTE — Discharge Instructions (Addendum)
 Dizziness and dark urine: Urine is now not as dark nor concentrated and does not indicate dehydration.  Patient has some dizziness when going from lying to sitting and also some dizziness when standing.  His blood pressure and pulse did not change significantly and his blood pressure did not drop.  His pulse did increase some from lying to standing.  Neurologic exam was also normal.  At this point patient needs to work on hydration and discuss his medications with cardiology.  I would not want him to stop the Eliquis /apixaban  without the approval of cardiology.  That blood thinner is to help prevent strokes and I would not want him to stop it on his own or after talking to the home health nurse without cardiology agreeing.  Follow-up here as needed.  See cardiology as needed.

## 2024-05-10 NOTE — Telephone Encounter (Signed)
 Patient seeking care in Urgent Care. Closing CRM   CRM # [id number] Owner: [name], RN Primary Information  Source  John Keith (Patient)   Subject  John Keith (Patient)   Topic  Clinical - Red Word Triage    Communication  Red Word that prompted transfer to Nurse Triage: dark urine, dizziness, nauseous, bad smell.left flank pain. Heather: [employer] works with pt cardiologist.

## 2024-05-10 NOTE — Telephone Encounter (Signed)
 Let's hold Farxiga  for now and wait for results of workup before making any additional changes

## 2024-05-10 NOTE — Telephone Encounter (Signed)
 HF Paramedicine Message to [facility]  Pharmacy (if applicable): [facility]   Issue/reason for call: Possible UTI   Blood pressure?  102/60   Reaching out reference him having possible UTI symptoms- dark urine, foul smelling urine, left flank pain, nausea, dizziness- will be seen today at [facility] for same symptoms but asking if we should stop Farxiga - will wait until results for final word on UTI diagnosis but wanted to forward to HF triage and med changes.   [name], EMT-Paramedic [phone] 05/10/2024

## 2024-05-10 NOTE — ED Triage Notes (Addendum)
 Pt c/o of light headed,dizziness, dark urine x 3 days. Has not taken any medicines for current sx. Denies any shortness of breathe.

## 2024-05-10 NOTE — Progress Notes (Signed)
 Paramedicine Encounter    Patient ID: BUTCH OTTERSON, male    DOB: 01-30-1951, 73 y.o.   MRN: [id number]   Complaints- possible UTI- dark urine, foul smelling urine, left flank pain, dizziness, nausea X2 weeks   Assessment- CAOx4, warm and dry seated in his dining room reporting he had a moment of severe dizziness where he blacked out with no fall on Tuesday morning while at work and continued to work after this episode. He says he has been feeling dizzy intermittently for two weeks with left flank pain, dark urine and foul smelling urine for two weeks. No swelling, lungs clear, weight stable. No orthostatic changes. No shortness of breath. No chest pain.   Compliance with meds- none   Pill box filled- for two weeks   Refills needed- gabapentin , entresto , zetia , folic acid    Meds changes since last visit- none     Social changes- none    VISIT SUMMARY- CAOx4, warm and dry seated in his dining room reporting he had a moment of severe dizziness where he blacked out with no fall on Tuesday morning while at work and continued to work after this episode. He says he has been feeling dizzy intermittently for two weeks with left flank pain, dark urine and foul smelling urine for two weeks. No swelling, lungs clear, weight stable. No orthostatic changes. No shortness of breath. No chest pain. I set up an urgent care visit for him at [facility] for today at 12. I sent a message to HF triage for Farxiga . I will follow up once his appointment is complete. I set up two weeks of pill boxes. I will plan to see him in two weeks. Home visit complete.   BP 102/60   Pulse 75   Resp 16   Wt 237 lb 6.4 oz (107.7 kg)   SpO2 95%   BMI 25.44 kg/m  Weight yesterday-- 236lbs  Last visit weight-- 236lbs      ACTION: Home visit completed     Patient Care Team: Bair, Kalpana, MD as PCP - General (Family Medicine) [name], MD as PCP - Electrophysiology (Cardiology) [name], [name],  MD as PCP - Cardiology (Cardiology) [name], MD as Consulting Physician (Internal Medicine) [name], MD as Attending Physician (Cardiology) [name], MD as Consulting Physician (Cardiology)  Patient Active Problem List   Diagnosis Date Noted   Ascending aorta dilation 05/01/2024   Emphysema lung (HCC) 05/01/2024   Cardiac sarcoidosis 01/04/2024   Primary osteoarthritis 01/04/2024   Presence of heart assist device (HCC) 01/04/2024   On anticoagulant therapy 08/29/2023   On amiodarone  therapy 08/29/2023   Onychomycosis 01/31/2023   Statin myopathy 11/15/2022   Encounter for screening for lung cancer 11/15/2022   Chronic systolic heart failure (HCC) 12/15/2021   Chronic combined systolic and diastolic CHF (congestive heart failure) (HCC) 12/15/2021   Ventricular tachycardia (HCC) 12/07/2021   Typical atrial flutter (HCC) 09/24/2021   Secondary hypercoagulable state 09/24/2021   Abnormal MRI, lumbar spine 10/23/2020   Chronic low back pain 10/23/2020   Atherosclerosis of native coronary artery of native heart without angina pectoris 10/23/2020   Impingement syndrome of left shoulder region 08/22/2020   Pain in joint of left shoulder 07/24/2020   Chronic left shoulder pain 07/11/2020   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   Normocytic normochromic anemia 12/13/2019   PVC (premature ventricular contraction) 10/24/2019   Ventricular premature beats 10/24/2019   Elevated ferritin 07/07/2019   Postherpetic neuralgia 08/17/2018   Benign  prostatic hyperplasia 08/01/2018   Hip pain 06/29/2018   Aortic atherosclerosis 09/22/2016   Lower urinary tract symptoms (LUTS) 06/03/2016   Lumbar radiculopathy 02/20/2016   Insomnia 06/10/2015   Health care maintenance 01/09/2014   Hyperlipidemia 11/09/2012   Essential hypertension 11/06/2012   Seasonal allergies 11/06/2012   Erectile dysfunction 11/06/2012   GERD (gastroesophageal reflux disease) 11/06/2012   Lactose  intolerance 11/06/2012   Diverticulosis of colon without hemorrhage 11/06/2012   Degeneration of thoracolumbar intervertebral disc 11/06/2012    Current Outpatient Medications:    apixaban  (ELIQUIS ) 5 MG TABS tablet, TAKE 1 TABLET(5 MG) BY MOUTH TWICE DAILY, Disp: 60 tablet, Rfl: 11   calcium  carbonate (OS-CAL - DOSED IN MG OF ELEMENTAL CALCIUM ) 1250 (500 Ca) MG tablet, Take 1 tablet (500 mg of elemental calcium  total) by mouth daily with breakfast., Disp: 30 tablet, Rfl: 11   carvedilol  (COREG ) 6.25 MG tablet, TAKE 1 TABLET(6.25 MG) BY MOUTH TWICE DAILY WITH A MEAL, Disp: 180 tablet, Rfl: 3   Cholecalciferol  (VITAMIN D -3) 125 MCG (5000 UT) TABS, Take 1 tablet by mouth daily., Disp: 30 tablet, Rfl: 11   ezetimibe  (ZETIA ) 10 MG tablet, Take 1 tablet (10 mg total) by mouth daily., Disp: 90 tablet, Rfl: 3   FARXIGA  10 MG TABS tablet, Take 1 tablet (10 mg total) by mouth daily., Disp: 90 tablet, Rfl: 3   ferrous sulfate  325 (65 FE) MG tablet, Take 325 mg by mouth daily with breakfast., Disp: , Rfl:    folic acid  (FOLVITE ) 1 MG tablet, Take 1 tablet (1 mg total) by mouth daily., Disp: 30 tablet, Rfl: 6   gabapentin  (NEURONTIN ) 300 MG capsule, Take 3 capsules (900 mg total) by mouth at bedtime., Disp: 270 capsule, Rfl: 1   methotrexate  (RHEUMATREX) 2.5 MG tablet, TAKE 20MG (8 TABLETS) EVERY WEDNESDAY. PROTECT FROM LIGHT AS DIRECTED, Disp: 150 tablet, Rfl: 0   Multiple Vitamin (MULTIVITAMIN) tablet, Take 1 tablet by mouth daily., Disp: , Rfl:    pantoprazole  (PROTONIX ) 40 MG tablet, Take 1 tablet (40 mg total) by mouth daily. 30 min before food, Disp: 30 tablet, Rfl: 6   sacubitril -valsartan  (ENTRESTO ) 24-26 MG, Take 1 tablet by mouth 2 (two) times daily., Disp: 180 tablet, Rfl: 3   spironolactone  (ALDACTONE ) 25 MG tablet, Take 1 tablet (25 mg total) by mouth daily., Disp: 90 tablet, Rfl: 3 Allergies  Allergen Reactions   Crestor  [Rosuvastatin  Calcium ] Other (See Comments)    Elevated muscle enzymes     Lipitor [Atorvastatin  Calcium ] Other (See Comments)    Elevated muscle enzymes      Social History   Socioeconomic History   Marital status: Married    Spouse name: Gwendolyn   Number of children: 2   Years of education: Not on file   Highest education level: High school graduate  Occupational History   Occupation: Retired    Comment: Semi  Tobacco Use   Smoking status: Former    Current packs/day: 0.00    Average packs/day: 0.5 packs/day for 49.0 years (24.5 ttl pk-yrs)    Types: Cigarettes    Start date: 07/06/1971    Quit date: 06/26/2020    Years since quitting: 3.8   Smokeless tobacco: Never   Tobacco comments:    Former smoker 09/24/21  Vaping Use   Vaping status: Never Used  Substance and Sexual Activity   Alcohol use: No    Alcohol/week: 0.0 standard drinks of alcohol   Drug use: No   Sexual activity: Not on file  Other Topics Concern   Not on file  Social History Narrative   Married    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    On child    1 son, 1 daughter   Machine op   Owns guns    Wears seat belt, safe in relationship    Smoker    Retired 06/27/2019   Social Drivers of Health   Financial Resource Strain: Low Risk  (04/09/2024)   Overall Financial Resource Strain (CARDIA)    Difficulty of Paying Living Expenses: Not hard at all  Food Insecurity: No Food Insecurity (04/09/2024)   Hunger Vital Sign    Worried About Running Out of Food in the Last Year: Never true    Ran Out of Food in the Last Year: Never true  Transportation Needs: No Transportation Needs (04/09/2024)   PRAPARE - Administrator, Civil Service (Medical): No    Lack of Transportation (Non-Medical): No  Physical Activity: Inactive (04/09/2024)   Exercise Vital Sign    Days of Exercise per Week: 0 days    Minutes of Exercise per Session: 0 min  Stress: No Stress Concern Present (04/09/2024)   [employer] of Occupational Health - Occupational Stress Questionnaire    Feeling of Stress: Not  at all  Social Connections: Moderately Integrated (04/09/2024)   Social Connection and Isolation Panel    Frequency of Communication with Friends and Family: Once a week    Frequency of Social Gatherings with Friends and Family: More than three times a week    Attends Religious Services: Never    Database Administrator or Organizations: No    Attends Engineer, Structural: More than 4 times per year    Marital Status: Married  Catering Manager Violence: Not At Risk (04/09/2024)   Humiliation, Afraid, Rape, and Kick questionnaire    Fear of Current or Ex-Partner: No    Emotionally Abused: No    Physically Abused: No    Sexually Abused: No    Physical Exam      Future Appointments  Date Time Provider Department Center  05/10/2024 12:00 PM [facility] PROVIDER [facility] None  05/24/2024  9:00 AM McCaughan, Dia D, DPM [facility]  06/08/2024  7:00 AM [facility] DEVICE REMOTES [facility]  06/18/2024  8:00 AM Bair, Luke, MD [facility] [address]  04/15/2025  8:10 AM [facility] ANNUAL WELLNESS VISIT [facility] [address]

## 2024-05-10 NOTE — ED Provider Notes (Addendum)
 John Keith    CSN: [id number] Arrival date & time: 05/10/24  1053      History   Chief Complaint Chief Complaint  Patient presents with   Dizziness    HPI John Keith is a 73 y.o. male.   73 year old male here with his wife.  He has been lightheaded and felt dizzy and sometimes the room goes a little dark but he has not actually passed out.  Symptoms started on 04/06/2024 or earlier.  He has a home health nurse that comes to see him due to cardiac problems and she felt the Eliquis  or apixaban  was causing his symptoms.  She asked him to hold the apixaban  and she was gena talk to cardiology about that.  He has been taking the Eliquis  until today.  He has not taken anything for his symptoms.  He has noticed that his urine is dark and concentrated but he does not have an odor to his urine.  He denies fever, chills, dysuria, nausea, vomiting, constipation, diarrhea.   Dizziness Associated symptoms: no chest pain, no diarrhea, no nausea, no palpitations and no vomiting     Past Medical History:  Diagnosis Date   Abnormal x-ray of neck 06/16/2021   Acute encephalopathy 12/18/2023   Allergy    Arthritis    Atrial flutter (HCC)    Bilateral carpal tunnel syndrome 01/04/2024   Carpal tunnel syndrome on left    Chest pain of uncertain etiology 10/24/2019   Constipation 03/10/2016   COVID-19    07/07/19   COVID-19 virus detected 07/12/2019   DDD (degenerative disc disease), thoracolumbar    multilevel   Degenerative joint disease of left shoulder    02/2011    Diverticulosis    left colon (2008)    ED (erectile dysfunction)    02/2011    External hemorrhoid 03/07/2015   Fatigue 06/03/2016   GERD (gastroesophageal reflux disease)    H. pylori infection    Hyperlipidemia    Hypertension    Insomnia    Lactose intolerance    Low back pain    PVC (premature ventricular contraction) 10/24/2019   Shingles    07/2018   Smoking    smoking since age 49 y.o, quit in  2023   Tobacco abuse 11/06/2012   Toe fracture, left    4th in 2014   Vitamin D  deficiency     Patient Active Problem List   Diagnosis Date Noted   Ascending aorta dilation 05/01/2024   Emphysema lung (HCC) 05/01/2024   Cardiac sarcoidosis 01/04/2024   Primary osteoarthritis 01/04/2024   Presence of heart assist device (HCC) 01/04/2024   On anticoagulant therapy 08/29/2023   On amiodarone  therapy 08/29/2023   Onychomycosis 01/31/2023   Statin myopathy 11/15/2022   Encounter for screening for lung cancer 11/15/2022   Chronic systolic heart failure (HCC) 12/15/2021   Chronic combined systolic and diastolic CHF (congestive heart failure) (HCC) 12/15/2021   Ventricular tachycardia (HCC) 12/07/2021   Typical atrial flutter (HCC) 09/24/2021   Secondary hypercoagulable state 09/24/2021   Abnormal MRI, lumbar spine 10/23/2020   Chronic low back pain 10/23/2020   Atherosclerosis of native coronary artery of native heart without angina pectoris 10/23/2020   Impingement syndrome of left shoulder region 08/22/2020   Pain in joint of left shoulder 07/24/2020   Chronic left shoulder pain 07/11/2020   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   Normocytic normochromic anemia 12/13/2019   PVC (premature ventricular contraction) 10/24/2019   Ventricular premature beats 10/24/2019  Elevated ferritin 07/07/2019   Postherpetic neuralgia 08/17/2018   Benign prostatic hyperplasia 08/01/2018   Hip pain 06/29/2018   Aortic atherosclerosis 09/22/2016   Lower urinary tract symptoms (LUTS) 06/03/2016   Lumbar radiculopathy 02/20/2016   Insomnia 06/10/2015   Health Keith maintenance 01/09/2014   Hyperlipidemia 11/09/2012   Essential hypertension 11/06/2012   Seasonal allergies 11/06/2012   Erectile dysfunction 11/06/2012   GERD (gastroesophageal reflux disease) 11/06/2012   Lactose intolerance 11/06/2012   Diverticulosis of colon without hemorrhage 11/06/2012   Degeneration of thoracolumbar  intervertebral disc 11/06/2012    Past Surgical History:  Procedure Laterality Date   APPENDECTOMY     BACK SURGERY     x 2 10991 and 1995 in [place]    CARDIAC CATHETERIZATION     1999 nl   CARDIOVERSION N/A 10/19/2021   Procedure: CARDIOVERSION;  Surgeon: Santo Stanly LABOR, MD;  Location: [facility]COPY;  Service: Cardiovascular;  Laterality: N/A;   CARPAL TUNNEL RELEASE Left    COLONOSCOPY     2008 diverticulosis   ESOPHAGOGASTRODUODENOSCOPY     h/o +h. pylori   ICD IMPLANT N/A 12/10/2021   Procedure: ICD IMPLANT;  Surgeon: [name], MD;  Location: [facility] INVASIVE CV LAB;  Service: Cardiovascular;  Laterality: N/A;   LEFT HEART CATH AND CORONARY ANGIOGRAPHY N/A 12/08/2021   Procedure: LEFT HEART CATH AND CORONARY ANGIOGRAPHY;  Surgeon: Dann Candyce RAMAN, MD;  Location: [facility] INVASIVE CV LAB;  Service: Cardiovascular;  Laterality: N/A;   OTHER SURGICAL HISTORY     heart catherization 1999 normal    SPINE SURGERY     x 2        Home Medications    Prior to Admission medications   Medication Sig Start Date End Date Taking? Authorizing Provider  apixaban  (ELIQUIS ) 5 MG TABS tablet TAKE 1 TABLET(5 MG) BY MOUTH TWICE DAILY 12/26/23   [name], [name], MD  calcium  carbonate (OS-CAL - DOSED IN MG OF ELEMENTAL CALCIUM ) 1250 (500 Ca) MG tablet Take 1 tablet (500 mg of elemental calcium  total) by mouth daily with breakfast. 12/15/21   Colletta Manuelita Garre, PA-C  carvedilol  (COREG ) 6.25 MG tablet TAKE 1 TABLET(6.25 MG) BY MOUTH TWICE DAILY WITH A MEAL 01/17/24   [name], [name], MD  Cholecalciferol  (VITAMIN D -3) 125 MCG (5000 UT) TABS Take 1 tablet by mouth daily. 12/15/21   Colletta Manuelita Garre, PA-C  ezetimibe  (ZETIA ) 10 MG tablet Take 1 tablet (10 mg total) by mouth daily. 02/02/24   [name], [name], MD  FARXIGA  10 MG TABS tablet Take 1 tablet (10 mg total) by mouth daily. 08/17/23   [name], [name], MD  ferrous sulfate  325 (65 FE) MG tablet Take 325 mg by mouth  daily with breakfast.    [provider]  folic acid  (FOLVITE ) 1 MG tablet Take 1 tablet (1 mg total) by mouth daily. 12/08/23   Milford, Harlene HERO, FNP  gabapentin  (NEURONTIN ) 300 MG capsule Take 3 capsules (900 mg total) by mouth at bedtime. 04/05/24   Bair, Luke, MD  methotrexate  (RHEUMATREX) 2.5 MG tablet TAKE 20MG (8 TABLETS) EVERY WEDNESDAY. PROTECT FROM LIGHT AS DIRECTED 10/03/23   [name], Daniel R, MD  Multiple Vitamin (MULTIVITAMIN) tablet Take 1 tablet by mouth daily.    [provider]  pantoprazole  (PROTONIX ) 40 MG tablet Take 1 tablet (40 mg total) by mouth daily. 30 min before food 04/05/24   Bair, Kalpana, MD  sacubitril -valsartan  (ENTRESTO ) 24-26 MG Take 1 tablet by mouth 2 (two) times daily.  08/17/23   [name], [name], MD  spironolactone  (ALDACTONE ) 25 MG tablet Take 1 tablet (25 mg total) by mouth daily. 10/27/23   [name], [name], MD    Family History Family History  Problem Relation Age of Onset   Heart disease Mother        age 76    Throat cancer Father 52   Cancer Father        throat    Cancer Maternal Uncle        uncle ?maternal or paternal died colon cancer in his 76s    Diabetes Maternal Aunt    Heart attack Maternal Grandmother     Social History Social History   Tobacco Use   Smoking status: Former    Current packs/day: 0.00    Average packs/day: 0.5 packs/day for 49.0 years (24.5 ttl pk-yrs)    Types: Cigarettes    Start date: 07/06/1971    Quit date: 06/26/2020    Years since quitting: 3.8   Smokeless tobacco: Never   Tobacco comments:    Former smoker 09/24/21  Vaping Use   Vaping status: Never Used  Substance Use Topics   Alcohol use: No    Alcohol/week: 0.0 standard drinks of alcohol   Drug use: No     Allergies   Crestor  [rosuvastatin  calcium ] and Lipitor [atorvastatin  calcium ]   Review of Systems Review of Systems  Constitutional:  Negative for chills and fever.  HENT:  Negative for ear pain and sore  throat.   Eyes:  Negative for pain and visual disturbance.  Respiratory:  Negative for cough.   Cardiovascular:  Negative for chest pain and palpitations.  Gastrointestinal:  Negative for abdominal pain, constipation, diarrhea, nausea and vomiting.  Genitourinary:  Negative for dysuria and hematuria.  Musculoskeletal:  Negative for arthralgias and back pain.  Skin:  Negative for color change and rash.  Neurological:  Positive for dizziness and light-headedness. Negative for seizures and syncope.  All other systems reviewed and are negative.    Physical Exam Triage Vital Signs ED Triage Vitals  Encounter Vitals Group     BP 05/10/24 1121 115/76     Girls Systolic BP Percentile --      Girls Diastolic BP Percentile --      Boys Systolic BP Percentile --      Boys Diastolic BP Percentile --      Pulse Rate 05/10/24 1121 69     Resp 05/10/24 1121 18     Temp 05/10/24 1121 97.9 F (36.6 C)     Temp Source 05/10/24 1121 Oral     SpO2 05/10/24 1121 94 %     Weight --      Height --      Head Circumference --      Peak Flow --      Pain Score 05/10/24 1119 0     Pain Loc --      Pain Education --      Exclude from Growth Chart --    Orthostatic VS for the past 24 hrs:  BP- Lying Pulse- Lying BP- Sitting Pulse- Sitting BP- Standing at 0 minutes Pulse- Standing at 0 minutes  05/10/24 1301 136/87 53 (!) 142/91 59 (!) 161/103 68    Updated Vital Signs BP 115/76 (BP Location: Right Arm)   Pulse 69   Temp 97.9 F (36.6 C) (Oral)   Resp 18   SpO2 94%   Visual Acuity Right Eye Distance:   Left Eye  Distance:   Bilateral Distance:    Right Eye Near:   Left Eye Near:    Bilateral Near:     Physical Exam Vitals and nursing note reviewed.  Constitutional:      General: He is not in acute distress.    Appearance: He is well-developed. He is not ill-appearing, toxic-appearing or diaphoretic.  HENT:     Head: Normocephalic and atraumatic.     Right Ear: Hearing, tympanic  membrane, ear canal and external ear normal.     Left Ear: Hearing, tympanic membrane, ear canal and external ear normal.     Nose: No congestion or rhinorrhea.     Right Sinus: No maxillary sinus tenderness or frontal sinus tenderness.     Left Sinus: No maxillary sinus tenderness or frontal sinus tenderness.     Mouth/Throat:     Lips: Pink.     Mouth: Mucous membranes are moist.     Pharynx: Uvula midline. No oropharyngeal exudate or posterior oropharyngeal erythema.     Tonsils: No tonsillar exudate.  Eyes:     Conjunctiva/sclera: Conjunctivae normal.     Pupils: Pupils are equal, round, and reactive to light.  Cardiovascular:     Rate and Rhythm: Normal rate and regular rhythm.     Heart sounds: S1 normal and S2 normal. No murmur heard. Pulmonary:     Effort: Pulmonary effort is normal. No respiratory distress.     Breath sounds: Normal breath sounds. No decreased breath sounds, wheezing, rhonchi or rales.  Abdominal:     General: Bowel sounds are normal.     Palpations: Abdomen is soft.     Tenderness: There is no abdominal tenderness.  Musculoskeletal:        General: No swelling.     Cervical back: Neck supple.  Lymphadenopathy:     Head:     Right side of head: No submental, submandibular, tonsillar, preauricular or posterior auricular adenopathy.     Left side of head: No submental, submandibular, tonsillar, preauricular or posterior auricular adenopathy.     Cervical: No cervical adenopathy.     Right cervical: No superficial cervical adenopathy.    Left cervical: No superficial cervical adenopathy.  Skin:    General: Skin is warm and dry.     Capillary Refill: Capillary refill takes less than 2 seconds.     Findings: No rash.  Neurological:     Mental Status: He is alert and oriented to person, place, and time.     Cranial Nerves: Cranial nerves 2-12 are intact.     Sensory: Sensation is intact.     Motor: Motor function is intact.     Coordination: Coordination  is intact.     Gait: Gait is intact.  Psychiatric:        Mood and Affect: Mood normal.      UC Treatments / Results  Labs (all labs ordered are listed, but only abnormal results are displayed) Labs Reviewed  POCT URINE DIPSTICK - Abnormal; Notable for the following components:      Result Value   Glucose, UA >=1,000 (*)    All other components within normal limits  URINE CULTURE    EKG   Radiology No results found.  Procedures Procedures (including critical Keith time)  Medications Ordered in UC Medications - No data to display  Initial Impression / Assessment and Plan / UC Course  I have reviewed the triage vital signs and the nursing notes.  Pertinent labs & imaging  results that were available during my Keith of the patient were reviewed by me and considered in my medical decision making (see chart for details).  Plan of Keith: Dizziness and dark urine: Urine is now not as dark nor concentrated and does not indicate dehydration.  Patient has some dizziness when going from lying to sitting and also some dizziness when standing.  His blood pressure and pulse did not change significantly and his blood pressure did not drop.  His pulse did increase some from lying to standing.  Neurologic exam was also normal.  At this point patient needs to work on hydration and discuss his medications with cardiology.  I would not want him to stop the Eliquis /apixaban  without the approval of cardiology.  That blood thinner is to help prevent strokes and I would not want him to stop it on his own or after talking to the home health nurse without cardiology agreeing.  Confusion/Altered Mental Status:  The patient was neurologically intact on exam, except he was intermittently dizzy with sitting and standing (without hypotension nor tachycardia).  I had contact from [name], Paramedic with Cardiology.  She had made a house call with the patient due to reported symptoms (mostly dizziness).   She asked him to come and get an Urinalysis done to rule in/out UTI due to his use of Farxiga .  She asked him to stop the Farxiga , until further notice, in case it was causing an acute UTI.  She never asked him to stop the Eliquis .  He was very confused about what she had asked him to do today.  His UA showed Glucosuria but was otherwise normal.  Urine Culture sent to rule in/out an acute UTI as a possible cause of his dizziness.  Follow-up here as needed.  See cardiology as needed.  I reviewed the plan of Keith with the patient and/or the patient's guardian.  The patient and/or guardian had time to ask questions and acknowledged that the questions were answered.  I provided instruction on symptoms or reasons to return here or to go to an ER, if symptoms/condition did not improve, worsened or if new symptoms occurred.  Final Clinical Impressions(s) / UC Diagnoses   Final diagnoses:  Dizziness  Dark brown urine  Confusion     Discharge Instructions      Dizziness and dark urine: Urine is now not as dark nor concentrated and does not indicate dehydration.  Patient has some dizziness when going from lying to sitting and also some dizziness when standing.  His blood pressure and pulse did not change significantly and his blood pressure did not drop.  His pulse did increase some from lying to standing.  Neurologic exam was also normal.  At this point patient needs to work on hydration and discuss his medications with cardiology.  I would not want him to stop the Eliquis /apixaban  without the approval of cardiology.  That blood thinner is to help prevent strokes and I would not want him to stop it on his own or after talking to the home health nurse without cardiology agreeing.  Follow-up here as needed.  See cardiology as needed.     ED Prescriptions   None    PDMP not reviewed this encounter.   Ival Domino, FNP 05/10/24 1325    Ival Domino, FNP 05/10/24 [phone]

## 2024-05-12 LAB — URINE CULTURE: Culture: 90000 — AB

## 2024-05-14 ENCOUNTER — Ambulatory Visit (HOSPITAL_BASED_OUTPATIENT_CLINIC_OR_DEPARTMENT_OTHER): Payer: Self-pay | Admitting: Family Medicine

## 2024-05-14 MED ORDER — SULFAMETHOXAZOLE-TRIMETHOPRIM 800-160 MG PO TABS: 1.0000 | ORAL_TABLET | Freq: Two times a day (BID) | ORAL | 0 refills | Status: AC

## 2024-05-14 NOTE — Progress Notes (Signed)
 Urine culture is positive.  I was able to speak to the patient directly and update him on his results.  Will treat with Bactrim  DS, 1 pill, twice daily for 7 days.  Patient will pick up the medicine and get started today.  Encouraged to eat yogurt or take probiotics to reduce risk of antibiotic related diarrhea.

## 2024-05-17 ENCOUNTER — Other Ambulatory Visit (HOSPITAL_COMMUNITY): Payer: Self-pay

## 2024-05-23 ENCOUNTER — Other Ambulatory Visit (HOSPITAL_COMMUNITY): Payer: Self-pay | Admitting: Internal Medicine

## 2024-05-23 DIAGNOSIS — K219 Gastro-esophageal reflux disease without esophagitis: Secondary | ICD-10-CM

## 2024-05-24 ENCOUNTER — Other Ambulatory Visit (HOSPITAL_COMMUNITY): Payer: Self-pay

## 2024-05-24 ENCOUNTER — Ambulatory Visit: Admitting: Podiatry

## 2024-05-24 DIAGNOSIS — M79675 Pain in left toe(s): Secondary | ICD-10-CM

## 2024-05-24 DIAGNOSIS — B351 Tinea unguium: Secondary | ICD-10-CM

## 2024-05-24 DIAGNOSIS — I739 Peripheral vascular disease, unspecified: Secondary | ICD-10-CM | POA: Diagnosis not present

## 2024-05-24 DIAGNOSIS — M79674 Pain in right toe(s): Secondary | ICD-10-CM

## 2024-05-24 DIAGNOSIS — G609 Hereditary and idiopathic neuropathy, unspecified: Secondary | ICD-10-CM

## 2024-05-24 NOTE — Progress Notes (Signed)
 Subjective:  Patient ID: John Keith, male    DOB: February 12, 1951,  MRN: [id number]  John Keith presents to clinic today for:  Chief Complaint  Patient presents with   [facility]    Not diabetic, takes Eliquis . Denies corn/callous care needs. He does have problems at night with tingling in his toes.    Patient notes nails are thick and elongated, causing pain in shoe gear when ambulating.  He is currently taking gabapentin  300 mg daily with 900 mg taken at bedtime.  States he still gets tingling in the toes but that it is not affecting his sleep at this time.  His primary care provider prescribes this for him.  He states that he has been on it for a couple years.  PCP is Bair, Luke, MD. last seen around 01-04-24  Past Medical History:  Diagnosis Date   Abnormal x-ray of neck 06/16/2021   Acute encephalopathy 12/18/2023   Allergy    Arthritis    Atrial flutter (HCC)    Bilateral carpal tunnel syndrome 01/04/2024   Carpal tunnel syndrome on left    Chest pain of uncertain etiology 10/24/2019   Constipation 03/10/2016   COVID-19    07/07/19   COVID-19 virus detected 07/12/2019   DDD (degenerative disc disease), thoracolumbar    multilevel   Degenerative joint disease of left shoulder    02/2011    Diverticulosis    left colon (2008)    ED (erectile dysfunction)    02/2011    External hemorrhoid 03/07/2015   Fatigue 06/03/2016   GERD (gastroesophageal reflux disease)    H. pylori infection    Hyperlipidemia    Hypertension    Insomnia    Lactose intolerance    Low back pain    PVC (premature ventricular contraction) 10/24/2019   Shingles    07/2018   Smoking    smoking since age 75 y.o, quit in 2023   Tobacco abuse 11/06/2012   Toe fracture, left    4th in 2014   Vitamin D  deficiency     Allergies  Allergen Reactions   Crestor  [Rosuvastatin  Calcium ] Other (See Comments)    Elevated muscle enzymes    Lipitor [Atorvastatin  Calcium ] Other (See Comments)     Elevated muscle enzymes     Objective:  John Keith is a pleasant 73 y.o. male in NAD. AAO x 3.  Vascular Examination: Patient has palpable DP pulse, absent PT pulse bilateral.  Delayed capillary refill bilateral toes.  Sparse digital hair bilateral.  Proximal to distal cooling WNL bilateral.    Dermatological Examination: Interspaces are clear with no open lesions noted bilateral.  Skin is shiny and atrophic bilateral.  Nails are 3-12mm thick, with yellowish/brown discoloration, subungual debris and distal onycholysis x10.  There is pain with compression of nails x10.    Patient qualifies for at-risk foot care because of PVD.  Assessment/Plan: 1. Pain due to onychomycosis of toenails of both feet   2. PVD (peripheral vascular disease)   3. Idiopathic peripheral neuropathy    Mycotic nails x10 were sharply debrided with sterile nail nippers and power debriding burr to decrease bulk and length.  Discussed neuropathy and different treatment options.  Recommend he speak to his primary care provider about either adjusting the frequency or strength of his current medication or possibly try a different medication for neuropathy.  Informed the patient that some of the other medications have more drug to drug interactions and more side effects.  I did recommend alpha lipoic acid supplementation to try for now.  He should try it for at least 30 days.  Follow-up 3 months  Darden Flemister DSABRA Imperial, DPM, FACFAS [facility]     [address] [place], [place] [address]                Office [phone]  Fax [phone]

## 2024-05-24 NOTE — Progress Notes (Signed)
 Paramedicine Encounter    Patient ID: John Keith, male    DOB: August 10, 1950, 73 y.o.   MRN: [id number]   Complaints- none   Assessment- CAOX4, warm and dry seated at kitchen table with no complaints, no lower leg swelling, lungs clear, no weight gain,   Compliance with meds- yes   Pill box filled- two weeks   Refills needed- none   Meds changes since last visit- completed antibiotic on Sunday     Social changes- none    VISIT SUMMARY- Arrived for home visit for John Keith  who reports to be feeling well with no complaints today. Says he feels much better after completed antibiotic for uti. Vitals and assessment as noted in report. No swelling, no weight gain, lungs clear. Meds reviewed and confirmed. Pill box filled for two weeks. I called and scheduled HF clinic follow up as he hasn't been seen since June. APP will see him 12/2 at 1030. Home visit complete. I will see John Keith in clinic on 12/2.   BP 130/82   Pulse 69   Resp 16   Wt 238 lb (108 kg)   SpO2 96%   BMI 25.50 kg/m  Weight yesterday-- 239lbs  Last visit weight-- 237lbs      ACTION: Home visit completed     Patient Care Team: Abbey Bruckner, MD as PCP - General (Family Medicine) [name], MD as PCP - Electrophysiology (Cardiology) [name], [name], MD as PCP - Cardiology (Cardiology) [name], MD as Consulting Physician (Internal Medicine) [name], MD as Attending Physician (Cardiology) [name], MD as Consulting Physician (Cardiology)  Patient Active Problem List   Diagnosis Date Noted   Ascending aorta dilation 05/01/2024   Emphysema lung (HCC) 05/01/2024   Cardiac sarcoidosis 01/04/2024   Primary osteoarthritis 01/04/2024   Presence of heart assist device (HCC) 01/04/2024   On anticoagulant therapy 08/29/2023   On amiodarone  therapy 08/29/2023   Onychomycosis 01/31/2023   Statin myopathy 11/15/2022   Encounter for screening for lung cancer 11/15/2022    Chronic systolic heart failure (HCC) 12/15/2021   Chronic combined systolic and diastolic CHF (congestive heart failure) (HCC) 12/15/2021   Ventricular tachycardia (HCC) 12/07/2021   Typical atrial flutter (HCC) 09/24/2021   Secondary hypercoagulable state 09/24/2021   Abnormal MRI, lumbar spine 10/23/2020   Chronic low back pain 10/23/2020   Atherosclerosis of native coronary artery of native heart without angina pectoris 10/23/2020   Impingement syndrome of left shoulder region 08/22/2020   Pain in joint of left shoulder 07/24/2020   Chronic left shoulder pain 07/11/2020   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   Normocytic normochromic anemia 12/13/2019   PVC (premature ventricular contraction) 10/24/2019   Ventricular premature beats 10/24/2019   Elevated ferritin 07/07/2019   Postherpetic neuralgia 08/17/2018   Benign prostatic hyperplasia 08/01/2018   Hip pain 06/29/2018   Aortic atherosclerosis 09/22/2016   Lower urinary tract symptoms (LUTS) 06/03/2016   Lumbar radiculopathy 02/20/2016   Insomnia 06/10/2015   Health care maintenance 01/09/2014   Hyperlipidemia 11/09/2012   Essential hypertension 11/06/2012   Seasonal allergies 11/06/2012   Erectile dysfunction 11/06/2012   GERD (gastroesophageal reflux disease) 11/06/2012   Lactose intolerance 11/06/2012   Diverticulosis of colon without hemorrhage 11/06/2012   Degeneration of thoracolumbar intervertebral disc 11/06/2012    Current Outpatient Medications:    apixaban  (ELIQUIS ) 5 MG TABS tablet, TAKE 1 TABLET(5 MG) BY MOUTH TWICE DAILY, Disp: 60 tablet, Rfl: 11   calcium  carbonate (OS-CAL - DOSED IN MG  OF ELEMENTAL CALCIUM ) 1250 (500 Ca) MG tablet, Take 1 tablet (500 mg of elemental calcium  total) by mouth daily with breakfast., Disp: 30 tablet, Rfl: 11   carvedilol  (COREG ) 6.25 MG tablet, TAKE 1 TABLET(6.25 MG) BY MOUTH TWICE DAILY WITH A MEAL, Disp: 180 tablet, Rfl: 3   Cholecalciferol  (VITAMIN D -3) 125 MCG (5000 UT) TABS,  Take 1 tablet by mouth daily., Disp: 30 tablet, Rfl: 11   ezetimibe  (ZETIA ) 10 MG tablet, Take 1 tablet (10 mg total) by mouth daily., Disp: 90 tablet, Rfl: 3   FARXIGA  10 MG TABS tablet, Take 1 tablet (10 mg total) by mouth daily., Disp: 90 tablet, Rfl: 3   ferrous sulfate  325 (65 FE) MG tablet, Take 325 mg by mouth daily with breakfast., Disp: , Rfl:    folic acid  (FOLVITE ) 1 MG tablet, Take 1 tablet (1 mg total) by mouth daily., Disp: 30 tablet, Rfl: 6   gabapentin  (NEURONTIN ) 300 MG capsule, Take 3 capsules (900 mg total) by mouth at bedtime., Disp: 270 capsule, Rfl: 1   methotrexate  (RHEUMATREX) 2.5 MG tablet, TAKE 20MG (8 TABLETS) EVERY WEDNESDAY. PROTECT FROM LIGHT AS DIRECTED, Disp: 150 tablet, Rfl: 0   Multiple Vitamin (MULTIVITAMIN) tablet, Take 1 tablet by mouth daily., Disp: , Rfl:    pantoprazole  (PROTONIX ) 40 MG tablet, Take 1 tablet (40 mg total) by mouth daily. 30 min before food, Disp: 30 tablet, Rfl: 6   sacubitril -valsartan  (ENTRESTO ) 24-26 MG, Take 1 tablet by mouth 2 (two) times daily., Disp: 180 tablet, Rfl: 3   spironolactone  (ALDACTONE ) 25 MG tablet, Take 1 tablet (25 mg total) by mouth daily., Disp: 90 tablet, Rfl: 3 Allergies  Allergen Reactions   Crestor  [Rosuvastatin  Calcium ] Other (See Comments)    Elevated muscle enzymes    Lipitor [Atorvastatin  Calcium ] Other (See Comments)    Elevated muscle enzymes      Social History   Socioeconomic History   Marital status: Married    Spouse name: Gwendolyn   Number of children: 2   Years of education: Not on file   Highest education level: High school graduate  Occupational History   Occupation: Retired    Comment: Semi  Tobacco Use   Smoking status: Former    Current packs/day: 0.00    Average packs/day: 0.5 packs/day for 49.0 years (24.5 ttl pk-yrs)    Types: Cigarettes    Start date: 07/06/1971    Quit date: 06/26/2020    Years since quitting: 3.9   Smokeless tobacco: Never   Tobacco comments:    Former  smoker 09/24/21  Vaping Use   Vaping status: Never Used  Substance and Sexual Activity   Alcohol use: No    Alcohol/week: 0.0 standard drinks of alcohol   Drug use: No   Sexual activity: Not on file  Other Topics Concern   Not on file  Social History Narrative   Married    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    On child    1 son, 1 daughter   Systems Analyst op   Owns guns    Wears seat belt, safe in relationship    Smoker    Retired 06/27/2019   Social Drivers of Corporate Investment Banker Strain: Low Risk  (04/09/2024)   Overall Financial Resource Strain (CARDIA)    Difficulty of Paying Living Expenses: Not hard at all  Food Insecurity: No Food Insecurity (04/09/2024)   Hunger Vital Sign    Worried About Running Out of Food in the Last Year: Never true  Ran Out of Food in the Last Year: Never true  Transportation Needs: No Transportation Needs (04/09/2024)   PRAPARE - Administrator, Civil Service (Medical): No    Lack of Transportation (Non-Medical): No  Physical Activity: Inactive (04/09/2024)   Exercise Vital Sign    Days of Exercise per Week: 0 days    Minutes of Exercise per Session: 0 min  Stress: No Stress Concern Present (04/09/2024)   [employer] of Occupational Health - Occupational Stress Questionnaire    Feeling of Stress: Not at all  Social Connections: Moderately Integrated (04/09/2024)   Social Connection and Isolation Panel    Frequency of Communication with Friends and Family: Once a week    Frequency of Social Gatherings with Friends and Family: More than three times a week    Attends Religious Services: Never    Database Administrator or Organizations: No    Attends Engineer, Structural: More than 4 times per year    Marital Status: Married  Catering Manager Violence: Not At Risk (04/09/2024)   Humiliation, Afraid, Rape, and Kick questionnaire    Fear of Current or Ex-Partner: No    Emotionally Abused: No    Physically Abused: No    Sexually Abused:  No    Physical Exam      Future Appointments  Date Time Provider Department Center  06/08/2024  7:00 AM [facility] HVT DEVICE REMOTES [facility]  06/18/2024  8:00 AM Bair, Luke, MD [facility] [address]  08/23/2024  9:15 AM McCaughan, Dia D, DPM [facility]  04/15/2025  8:10 AM [facility] ANNUAL WELLNESS VISIT [facility] [address]

## 2024-06-04 ENCOUNTER — Telehealth (HOSPITAL_COMMUNITY): Payer: Self-pay | Admitting: *Deleted

## 2024-06-04 NOTE — Progress Notes (Signed)
 ADVANCED HF CLINIC NOTE   PCP: Dr. Glenys Ferrari HF Cardiologist: Dr. Cherrie  HPI: 73 y.o. 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    He established with Afib clinic in March for atrial flutter. He underwent successful DCCV 04/17. Despite restoration of SR he noted recurrent palpitations, lightheadedness and chest discomfort at f/u 05/15. 14 day Zio placed. Seen in Cardiology clinic 12/03/21 with palpitations, recurrent near syncope and a syncopal episode. BP meds reduced. Read on 14 day Zio was expedited and showed sustained episodes of VT (longest 1 minute 30 seconds).    Patient was referred to ED for admission d/t recurrent VT on monitor. EP was consulted. Echo showed EF 35-40%, grade II DD, RV mildly reduced, RVSP 27 mmHg. Cardiac MRI: EF 33%, RVEF 40%, with multifocal areas of LGE with high intensity in basal septal LGE, elevated T2 values in lateral wall concerning for myocardial inflammation, concerning for sarcoidosis. LHC showed mild nonobstructive CAD. He was started on amiodarone  and underwent ICD placement 12/10/21. Workup including PET for cardiac sarcoid recommended as outpatient.   NM cardiac amyloid tumor LOC: Visual and quantitative assessment (grade 1, H/CL equal 1.28) is equivocally suggestive of transthyretin amyloidosis.   Follow up 7/23 with Dr. Cherrie. NYHA II, volume stable. Low dose spiro started.   Cardiac PET at [facility] (8/23) without evidence of cardiac sarcoidosis or inflammation .   Follow up 11/23, was doing well and had completed prednisone .   7/23 had repeat PET showing no evidence of sarcoid.   Echo 12/24 EF 55-60%   Today he returns for AHF follow up. Overall feeling ok. Denies palpitations, CP, dizziness, edema, or PND/Orthopnea. Had some SOB when he had UTI and has been feeling intermittently SOB since then. Appetite ok. No fever or chills. Weight  at home 244 pounds. Taking all medications. Denies ETOH, tobacco or drug use. Drinks <64 oz/day. SBP at home 140s-150s. We looked through all his meds today, he does not have Entresto  in there. Last filled was August 2025, ran out mid November and didn't get refills on accident. Suspect that's why his BP was elevated today and recently at home. Farxiga  stopped last month with recurrent UTI.    Cardiac Studies: - Repeat Cardiac PET (post pred/MXT regimen) 7/24 no evidence of sarcoid.  - Cardiac PET at [facility] (8/23) EF 43% without evidence of cardiac sarcoidosis or inflammation.  - PYP (6/23): Visual and quantitative assessment (grade 1, H/CL equal 1.28) is equivocally suggestive of transthyretin amyloidosis. - LHC (6/23): mild, non-obs CAD.  - cMRI (6/23): LVEF 33%, RVEF 40%, with multifocal areas of LGE with high intensity in basal septal LGE, elevated T2 values in lateral wall concerning for myocardial inflammation, concerning for sarcoidosis. - Echo (6/23): EF 35-40%, grade II DD, RV mildly reduced, RVSP 27 mmHg. - Zio 14 day (6/23): mostly NSR, multiple episodes of wide-complex tachycardia due to VT  Past Medical History:  Diagnosis Date   Abnormal x-ray of neck 06/16/2021   Acute encephalopathy 12/18/2023   Allergy    Arthritis    Atrial flutter (HCC)    Bilateral carpal tunnel syndrome 01/04/2024   Carpal tunnel syndrome on left    Chest pain of uncertain etiology 10/24/2019   Constipation 03/10/2016   COVID-19    07/07/19   COVID-19 virus detected 07/12/2019   DDD (degenerative disc disease), thoracolumbar    multilevel  Degenerative joint disease of left shoulder    02/2011    Diverticulosis    left colon (2008)    ED (erectile dysfunction)    02/2011    External hemorrhoid 03/07/2015   Fatigue 06/03/2016   GERD (gastroesophageal reflux disease)    H. pylori infection    Hyperlipidemia    Hypertension    Insomnia    Lactose intolerance    Low back pain    PVC (premature  ventricular contraction) 10/24/2019   Shingles    07/2018   Smoking    smoking since age 79 y.o, quit in 2023   Tobacco abuse 11/06/2012   Toe fracture, left    4th in 2014   Vitamin D  deficiency    Current Outpatient Medications  Medication Sig Dispense Refill   apixaban  (ELIQUIS ) 5 MG TABS tablet TAKE 1 TABLET(5 MG) BY MOUTH TWICE DAILY 60 tablet 11   calcium  carbonate (OS-CAL - DOSED IN MG OF ELEMENTAL CALCIUM ) 1250 (500 Ca) MG tablet Take 1 tablet (500 mg of elemental calcium  total) by mouth daily with breakfast. 30 tablet 11   carvedilol  (COREG ) 6.25 MG tablet TAKE 1 TABLET(6.25 MG) BY MOUTH TWICE DAILY WITH A MEAL 180 tablet 3   Cholecalciferol  (VITAMIN D -3) 125 MCG (5000 UT) TABS Take 1 tablet by mouth daily. 30 tablet 11   ezetimibe  (ZETIA ) 10 MG tablet Take 1 tablet (10 mg total) by mouth daily. 90 tablet 3   ferrous sulfate  325 (65 FE) MG tablet Take 325 mg by mouth daily with breakfast.     folic acid  (FOLVITE ) 1 MG tablet Take 1 tablet (1 mg total) by mouth daily. 30 tablet 6   furosemide  (LASIX ) 40 MG tablet Take 1 tablet (40 mg total) by mouth daily. 90 tablet 3   gabapentin  (NEURONTIN ) 300 MG capsule Take 3 capsules (900 mg total) by mouth at bedtime. 270 capsule 1   methotrexate  (RHEUMATREX) 2.5 MG tablet TAKE 20MG (8 TABLETS) EVERY WEDNESDAY. PROTECT FROM LIGHT AS DIRECTED 150 tablet 0   Multiple Vitamin (MULTIVITAMIN) tablet Take 1 tablet by mouth daily.     pantoprazole  (PROTONIX ) 40 MG tablet Take 1 tablet (40 mg total) by mouth daily. 30 min before food 30 tablet 6   spironolactone  (ALDACTONE ) 25 MG tablet Take 1 tablet (25 mg total) by mouth daily. 90 tablet 3   FARXIGA  10 MG TABS tablet Take 1 tablet (10 mg total) by mouth daily. (Patient not taking: Reported on 06/05/2024) 90 tablet 3   sacubitril -valsartan  (ENTRESTO ) 24-26 MG Take 1 tablet by mouth 2 (two) times daily. 180 tablet 3   No current facility-administered medications for this encounter.   Allergies   Allergen Reactions   Crestor  [Rosuvastatin  Calcium ] Other (See Comments)    Elevated muscle enzymes    Lipitor [Atorvastatin  Calcium ] Other (See Comments)    Elevated muscle enzymes    Social History   Socioeconomic History   Marital status: Married    Spouse name: Gwendolyn   Number of children: 2   Years of education: Not on file   Highest education level: High school graduate  Occupational History   Occupation: Retired    Comment: Semi  Tobacco Use   Smoking status: Former    Current packs/day: 0.00    Average packs/day: 0.5 packs/day for 49.0 years (24.5 ttl pk-yrs)    Types: Cigarettes    Start date: 07/06/1971    Quit date: 06/26/2020    Years since quitting: 3.9   Smokeless tobacco:  Never   Tobacco comments:    Former smoker 09/24/21  Vaping Use   Vaping status: Never Used  Substance and Sexual Activity   Alcohol use: No    Alcohol/week: 0.0 standard drinks of alcohol   Drug use: No   Sexual activity: Not on file  Other Topics Concern   Not on file  Social History Narrative   Married    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    On child    1 son, 1 daughter   Machine op   Owns guns    Wears seat belt, safe in relationship    Smoker    Retired 06/27/2019   Social Drivers of Corporate Investment Banker Strain: Low Risk  (04/09/2024)   Overall Financial Resource Strain (CARDIA)    Difficulty of Paying Living Expenses: Not hard at all  Food Insecurity: No Food Insecurity (04/09/2024)   Hunger Vital Sign    Worried About Running Out of Food in the Last Year: Never true    Ran Out of Food in the Last Year: Never true  Transportation Needs: No Transportation Needs (04/09/2024)   PRAPARE - Administrator, Civil Service (Medical): No    Lack of Transportation (Non-Medical): No  Physical Activity: Inactive (04/09/2024)   Exercise Vital Sign    Days of Exercise per Week: 0 days    Minutes of Exercise per Session: 0 min  Stress: No Stress Concern Present (04/09/2024)    [employer] of Occupational Health - Occupational Stress Questionnaire    Feeling of Stress: Not at all  Social Connections: Moderately Integrated (04/09/2024)   Social Connection and Isolation Panel    Frequency of Communication with Friends and Family: Once a week    Frequency of Social Gatherings with Friends and Family: More than three times a week    Attends Religious Services: Never    Database Administrator or Organizations: No    Attends Engineer, Structural: More than 4 times per year    Marital Status: Married  Catering Manager Violence: Not At Risk (04/09/2024)   Humiliation, Afraid, Rape, and Kick questionnaire    Fear of Current or Ex-Partner: No    Emotionally Abused: No    Physically Abused: No    Sexually Abused: No   Family History  Problem Relation Age of Onset   Heart disease Mother        age 43    Throat cancer Father 30   Cancer Father        throat    Cancer Maternal Uncle        uncle ?maternal or paternal died colon cancer in his 25s    Diabetes Maternal Aunt    Heart attack Maternal Grandmother   - His mother and grandmother had heart issues, uncertain if they had CHF.  BP (!) 152/78   Pulse 67   Wt 115.4 kg (254 lb 6.4 oz)   SpO2 94%   BMI 27.26 kg/m   Wt Readings from Last 3 Encounters:  06/05/24 115.4 kg (254 lb 6.4 oz)  05/24/24 108 kg (238 lb)  05/10/24 107.7 kg (237 lb 6.4 oz)   PHYSICAL EXAM: General:  well appearing.  No respiratory difficulty. Walked into clinic.  Neck: JVD flat.  Cor: Regular rate & rhythm. No murmurs. Lungs: diminished bases Extremities: no edema  Neuro: alert & oriented x 3. Affect pleasant.   MDT ICD interrogation (personally reviewed): No AF, Activity level 1.5hr/day. Volume ok.  VS 99.8%. optivol stable. Since 5/25: VT episode on device interrogation noted today, longest 5 minutes, no shocks on interrogation. Reviewed by Dr. Inocencio 9/25 on remote transmission 5 minute run was actually NCT,  personally reviewed strip today.   ReDs reading: 41 %, abnormal   ECG: NSR 67 bpm occasional PVCs (Personally reviewed)    ASSESSMENT & PLAN: Chronic systolic CHF/NICM in setting of cardiac sarcoidosis - Echo (6/23): EF 35-40%, grade II DD, RV mildly reduced, RVSP 27 mmHg. - Cardiac MRI (6/23): EF 33%, RVEF 40%, with multifocal areas of LGE with high intensity in basal septal LGE, elevated T2 values in lateral wall concerning for myocardial inflammation. Findings concerning for sarcoidosis.  - LHC (6/23): mild nonobstructive CAD. - Suspect etiology cardiac sarcoidosis (most likely) vs hereditary LMNA cardiomyopathy. - Cardiac PET at [facility] (8/23) showed no evidence of cardiac sarcoidosis. EF 43% - Completed Prednisone  11/23  - Repeat PET 7/24 no evidence of sarcoid - Continue maintenance dose methotrexate  20 mg weekly (Has seen Dr. Mai who agrees) - Echo 12/24 EF 55-60%  - Stable NYHA II-IIIa - Volume ok on corevue and exam. Reports fatigue and SOB. ReDS checked and is elevated at 41%. Will start Lasix  40 mg daily. BMET/BNP today. Repeat BMET in 1 week.  - Continue spironolactone  25 mg daily.  - Continue Coreg  6.25 mg bid. - Restart Entresto  24/26 mg bid. H/o low BP/orthostatics limits further titration, plan to reevaluate at f/u.  - Off Farxiga  with UTI x2 since June/2025. Would not restart.  - ICD interrogated personally as above - Update echo, next available. If significant changed noted would consider updating PET.   2. VT/Hx syncope: - ICD interrogated personally. No VT/AF. NCT per EP.  - Now off amio as sarcoid quiescent and EF normal   3. Atrial flutter: - Now off amio.  - Continue Eliquis . Denies abnormal bleeding.   4. CAD: - Mild on LHC 6/23. - no s/s angina - no ASA w/ a/c  - Continue Zetia . Has history of elevated LFTs with statins. Update lipid panel at f/u. If still elevated would refer to lipid clinic.   Followed by paramedicine, appreciate assistance.   F/u  in 6 weeks with Dr. [name].   Beckey LITTIE Coe, NP  11:10 AM

## 2024-06-04 NOTE — Telephone Encounter (Signed)
 Called to confirm/remind patient of their appointment at the [facility] on 06/05/24:       Appointment:              [x] Confirmed             [] Left mess              [] No answer/No voice mail             [] Phone not in service   Patient reminded to bring all medications and/or complete list.   Confirmed patient has transportation. Gave directions, instructed to utilize valet parking.

## 2024-06-05 ENCOUNTER — Encounter (HOSPITAL_COMMUNITY): Payer: Self-pay

## 2024-06-05 ENCOUNTER — Ambulatory Visit (HOSPITAL_COMMUNITY)
Admission: RE | Admit: 2024-06-05 | Discharge: 2024-06-05 | Disposition: A | Source: Ambulatory Visit | Attending: Internal Medicine

## 2024-06-05 ENCOUNTER — Ambulatory Visit (HOSPITAL_COMMUNITY): Payer: Self-pay | Admitting: Internal Medicine

## 2024-06-05 VITALS — BP 152/78 | HR 67 | Wt 254.4 lb

## 2024-06-05 DIAGNOSIS — R55 Syncope and collapse: Secondary | ICD-10-CM | POA: Insufficient documentation

## 2024-06-05 DIAGNOSIS — Z95810 Presence of automatic (implantable) cardiac defibrillator: Secondary | ICD-10-CM | POA: Insufficient documentation

## 2024-06-05 DIAGNOSIS — Z87898 Personal history of other specified conditions: Secondary | ICD-10-CM

## 2024-06-05 DIAGNOSIS — Z7901 Long term (current) use of anticoagulants: Secondary | ICD-10-CM | POA: Diagnosis not present

## 2024-06-05 DIAGNOSIS — Z7984 Long term (current) use of oral hypoglycemic drugs: Secondary | ICD-10-CM | POA: Insufficient documentation

## 2024-06-05 DIAGNOSIS — I472 Ventricular tachycardia, unspecified: Secondary | ICD-10-CM

## 2024-06-05 DIAGNOSIS — I428 Other cardiomyopathies: Secondary | ICD-10-CM | POA: Diagnosis not present

## 2024-06-05 DIAGNOSIS — R7989 Other specified abnormal findings of blood chemistry: Secondary | ICD-10-CM | POA: Diagnosis not present

## 2024-06-05 DIAGNOSIS — I5020 Unspecified systolic (congestive) heart failure: Secondary | ICD-10-CM | POA: Diagnosis not present

## 2024-06-05 DIAGNOSIS — Z87891 Personal history of nicotine dependence: Secondary | ICD-10-CM | POA: Insufficient documentation

## 2024-06-05 DIAGNOSIS — I5022 Chronic systolic (congestive) heart failure: Secondary | ICD-10-CM | POA: Insufficient documentation

## 2024-06-05 DIAGNOSIS — I4892 Unspecified atrial flutter: Secondary | ICD-10-CM | POA: Diagnosis not present

## 2024-06-05 DIAGNOSIS — Z79899 Other long term (current) drug therapy: Secondary | ICD-10-CM | POA: Diagnosis not present

## 2024-06-05 DIAGNOSIS — I493 Ventricular premature depolarization: Secondary | ICD-10-CM | POA: Diagnosis not present

## 2024-06-05 DIAGNOSIS — I2510 Atherosclerotic heart disease of native coronary artery without angina pectoris: Secondary | ICD-10-CM | POA: Insufficient documentation

## 2024-06-05 DIAGNOSIS — R0602 Shortness of breath: Secondary | ICD-10-CM | POA: Insufficient documentation

## 2024-06-05 DIAGNOSIS — R5383 Other fatigue: Secondary | ICD-10-CM | POA: Insufficient documentation

## 2024-06-05 DIAGNOSIS — E785 Hyperlipidemia, unspecified: Secondary | ICD-10-CM | POA: Insufficient documentation

## 2024-06-05 DIAGNOSIS — D8685 Sarcoid myocarditis: Secondary | ICD-10-CM

## 2024-06-05 DIAGNOSIS — I110 Hypertensive heart disease with heart failure: Secondary | ICD-10-CM | POA: Diagnosis not present

## 2024-06-05 LAB — BASIC METABOLIC PANEL WITH GFR
Anion gap: 8 (ref 5–15)
BUN: 11 mg/dL (ref 8–23)
CO2: 27 mmol/L (ref 22–32)
Calcium: 8.8 mg/dL — ABNORMAL LOW (ref 8.9–10.3)
Chloride: 106 mmol/L (ref 98–111)
Creatinine, Ser: 1.13 mg/dL (ref 0.61–1.24)
GFR, Estimated: >60 mL/min (ref >60)
Glucose, Bld: 86 mg/dL (ref 70–99)
Potassium: 3.8 mmol/L (ref 3.5–5.1)
Sodium: 141 mmol/L (ref 135–145)

## 2024-06-05 LAB — BRAIN NATRIURETIC PEPTIDE: B Natriuretic Peptide: 90.9 pg/mL (ref 0.0–100.0)

## 2024-06-05 MED ORDER — FUROSEMIDE 40 MG PO TABS: 40.0000 mg | ORAL_TABLET | Freq: Every day | ORAL | 3 refills | Status: AC

## 2024-06-05 MED ORDER — SACUBITRIL-VALSARTAN 24-26 MG PO TABS: 1.0000 | ORAL_TABLET | Freq: Two times a day (BID) | ORAL | 3 refills | Status: AC

## 2024-06-05 NOTE — Addendum Note (Signed)
 Encounter addended by: Dante Jeannine HERO, CMA on: 06/05/2024 11:56 AM  Actions taken: Order list changed, Diagnosis association updated, Flowsheet accepted, Clinical Note Signed, Charge Capture section accepted

## 2024-06-05 NOTE — Progress Notes (Signed)
 ReDS Vest / Clip - 06/05/24 1100       ReDS Vest / Clip   Station Marker C    Ruler Value 30    ReDS Value Range High volume overload    ReDS Actual Value 41

## 2024-06-05 NOTE — Patient Instructions (Addendum)
 Good to see you today!  RESTART Entresto  24/26 mg (1 tablet) Twice daily  START Lasix  40 mg  daily  Your physician has requested that you have an echocardiogram. Echocardiography is a painless test that uses sound waves to create images of your heart. It provides your doctor with information about the size and shape of your heart and how well your heart's chambers and valves are working. This procedure takes approximately one hour. There are no restrictions for this procedure. Please do NOT wear cologne, perfume, aftershave, or lotions (deodorant is allowed). Please arrive 15 minutes prior to your appointment time.  Please note: We ask at that you not bring children with you during ultrasound (echo/ vascular) testing. Due to room size and safety concerns, children are not allowed in the ultrasound rooms during exams. Our front office staff cannot provide observation of children in our lobby area while testing is being conducted. An adult accompanying a patient to their appointment will only be allowed in the ultrasound room at the discretion of the ultrasound technician under special circumstances. We apologize for any inconvenience.  Labs done today, your results will be available in MyChart, we will contact you for abnormal readings.  Lab work in 1 week  Your physician recommends that you schedule a follow-up appointment as scheduled  If you have any questions or concerns before your next appointment please send us  a message through [place] or call our office at [phone].    TO LEAVE A MESSAGE FOR THE NURSE SELECT OPTION 2, PLEASE LEAVE A MESSAGE INCLUDING: YOUR NAME DATE OF BIRTH CALL BACK NUMBER REASON FOR CALL**this is important as we prioritize the call backs  YOU WILL RECEIVE A CALL BACK THE SAME DAY AS LONG AS YOU CALL BEFORE 4:00 PM At the [facility], you and your health needs are our priority. As part of our continuing mission to provide you with  exceptional heart care, we have created designated Provider Care Teams. These Care Teams include your primary Cardiologist (physician) and Advanced Practice Providers (APPs- Physician Assistants and Nurse Practitioners) who all work together to provide you with the care you need, when you need it.   You may see any of the following providers on your designated Care Team at your next follow up: Dr Toribio Fuel Dr Ezra Shuck Dr. Morene Brownie Greig Mosses, NP Caffie Shed, [place] [facility] [place], [place] Beckey Coe, NP Jordan Lee, NP Ellouise Class, NP Tinnie Redman, PharmD Jaun Bash, PharmD   Please be sure to bring in all your medications bottles to every appointment.    Thank you for choosing [facility]

## 2024-06-06 ENCOUNTER — Telehealth (HOSPITAL_COMMUNITY): Payer: Self-pay

## 2024-06-06 NOTE — Telephone Encounter (Signed)
 Spoke to John Keith who reports he has an ECHO appointment tomorrow at 3- he wants us  to meet for a med rec in the clinic I will plan to do so. I also reviewed med changes and confirmed his newest med doses. He did receive his entresto  and has restarted it as well as Lasix . He reports his weight in the home today is 240. I will follow up tomorrow.   [name], EMT-Paramedic [phone] 06/06/2024

## 2024-06-07 ENCOUNTER — Other Ambulatory Visit (HOSPITAL_COMMUNITY): Payer: Self-pay

## 2024-06-07 ENCOUNTER — Ambulatory Visit (HOSPITAL_COMMUNITY): Admission: RE | Admit: 2024-06-07 | Discharge: 2024-06-07 | Attending: Internal Medicine

## 2024-06-07 DIAGNOSIS — I110 Hypertensive heart disease with heart failure: Secondary | ICD-10-CM | POA: Diagnosis not present

## 2024-06-07 DIAGNOSIS — I509 Heart failure, unspecified: Secondary | ICD-10-CM | POA: Diagnosis not present

## 2024-06-07 DIAGNOSIS — I358 Other nonrheumatic aortic valve disorders: Secondary | ICD-10-CM | POA: Diagnosis not present

## 2024-06-07 DIAGNOSIS — D8685 Sarcoid myocarditis: Secondary | ICD-10-CM | POA: Diagnosis not present

## 2024-06-07 NOTE — Progress Notes (Signed)
 MADE IN ERROR

## 2024-06-07 NOTE — Progress Notes (Signed)
 Paramedicine Encounter    Patient ID: John Keith, male    DOB: [birth year], 73 y.o.   MRN: [id number]   Complaints- none   Assessment- receiving his echo.   Compliance with meds- no missed doses (has been taking entresto - refill ready at [facility])  Pill box filled- for two weeks   Refills needed- none   Meds changes since last visit- lasix  40 daily     Social changes- none    VISIT SUMMARY- Met with [name] at [facility] today where he was getting his ECHO done. We did not complete a full visit but he says he is feeling better and has been urinating a lot since starting the Lasix . He has been taking 40mg . Daily. Despite him telling APP he missed several doses of Entresto  he has not. Meds were reviewed and confirmed. Pill box filled for two weeks. I reviewed upcoming appointments and confirmed same. I will follow up in two weeks. He agreed.  Wt 240 lb (108.9 kg)   BMI 25.72 kg/m  Weight yesterday-- 241lbs Last visit weight--254lbs      ACTION: Home visit completed     Patient Care Team: Bair, Kalpana, MD as PCP - General (Family Medicine) [name], MD as PCP - Electrophysiology (Cardiology) [name], [name], MD as PCP - Cardiology (Cardiology) [name], MD as Consulting Physician (Internal Medicine) [name], MD as Attending Physician (Cardiology) [name], MD as Consulting Physician (Cardiology)  Patient Active Problem List   Diagnosis Date Noted   Ascending aorta dilation 05/01/2024   Emphysema lung (HCC) 05/01/2024   Cardiac sarcoidosis 01/04/2024   Primary osteoarthritis 01/04/2024   Presence of heart assist device (HCC) 01/04/2024   On anticoagulant therapy 08/29/2023   On amiodarone  therapy 08/29/2023   Onychomycosis 01/31/2023   Statin myopathy 11/15/2022   Encounter for screening for lung cancer 11/15/2022   Chronic systolic heart failure (HCC) 12/15/2021   Chronic combined systolic and diastolic CHF (congestive  heart failure) (HCC) 12/15/2021   Ventricular tachycardia (HCC) 12/07/2021   Typical atrial flutter (HCC) 09/24/2021   Secondary hypercoagulable state 09/24/2021   Abnormal MRI, lumbar spine 10/23/2020   Chronic low back pain 10/23/2020   Atherosclerosis of native coronary artery of native heart without angina pectoris 10/23/2020   Impingement syndrome of left shoulder region 08/22/2020   Pain in joint of left shoulder 07/24/2020   Chronic left shoulder pain 07/11/2020   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   Normocytic normochromic anemia 12/13/2019   PVC (premature ventricular contraction) 10/24/2019   Ventricular premature beats 10/24/2019   Elevated ferritin 07/07/2019   Postherpetic neuralgia 08/17/2018   Benign prostatic hyperplasia 08/01/2018   Hip pain 06/29/2018   Aortic atherosclerosis 09/22/2016   Lower urinary tract symptoms (LUTS) 06/03/2016   Lumbar radiculopathy 02/20/2016   Insomnia 06/10/2015   Health care maintenance 01/09/2014   Hyperlipidemia 11/09/2012   Essential hypertension 11/06/2012   Seasonal allergies 11/06/2012   Erectile dysfunction 11/06/2012   GERD (gastroesophageal reflux disease) 11/06/2012   Lactose intolerance 11/06/2012   Diverticulosis of colon without hemorrhage 11/06/2012   Degeneration of thoracolumbar intervertebral disc 11/06/2012    Current Outpatient Medications:    apixaban  (ELIQUIS ) 5 MG TABS tablet, TAKE 1 TABLET(5 MG) BY MOUTH TWICE DAILY, Disp: 60 tablet, Rfl: 11   calcium  carbonate (OS-CAL - DOSED IN MG OF ELEMENTAL CALCIUM ) 1250 (500 Ca) MG tablet, Take 1 tablet (500 mg of elemental calcium  total) by mouth daily with breakfast., Disp: 30 tablet, Rfl: 11  carvedilol  (COREG ) 6.25 MG tablet, TAKE 1 TABLET(6.25 MG) BY MOUTH TWICE DAILY WITH A MEAL, Disp: 180 tablet, Rfl: 3   Cholecalciferol  (VITAMIN D -3) 125 MCG (5000 UT) TABS, Take 1 tablet by mouth daily., Disp: 30 tablet, Rfl: 11   ezetimibe  (ZETIA ) 10 MG tablet, Take 1 tablet (10  mg total) by mouth daily., Disp: 90 tablet, Rfl: 3   ferrous sulfate  325 (65 FE) MG tablet, Take 325 mg by mouth daily with breakfast., Disp: , Rfl:    folic acid  (FOLVITE ) 1 MG tablet, Take 1 tablet (1 mg total) by mouth daily., Disp: 30 tablet, Rfl: 6   furosemide  (LASIX ) 40 MG tablet, Take 1 tablet (40 mg total) by mouth daily., Disp: 90 tablet, Rfl: 3   gabapentin  (NEURONTIN ) 300 MG capsule, Take 3 capsules (900 mg total) by mouth at bedtime., Disp: 270 capsule, Rfl: 1   methotrexate  (RHEUMATREX) 2.5 MG tablet, TAKE 20MG (8 TABLETS) EVERY WEDNESDAY. PROTECT FROM LIGHT AS DIRECTED, Disp: 150 tablet, Rfl: 0   Multiple Vitamin (MULTIVITAMIN) tablet, Take 1 tablet by mouth daily., Disp: , Rfl:    pantoprazole  (PROTONIX ) 40 MG tablet, Take 1 tablet (40 mg total) by mouth daily. 30 min before food, Disp: 30 tablet, Rfl: 6   sacubitril -valsartan  (ENTRESTO ) 24-26 MG, Take 1 tablet by mouth 2 (two) times daily., Disp: 180 tablet, Rfl: 3   spironolactone  (ALDACTONE ) 25 MG tablet, Take 1 tablet (25 mg total) by mouth daily., Disp: 90 tablet, Rfl: 3   FARXIGA  10 MG TABS tablet, Take 1 tablet (10 mg total) by mouth daily. (Patient not taking: Reported on 06/05/2024), Disp: 90 tablet, Rfl: 3 Allergies  Allergen Reactions   Crestor  [Rosuvastatin  Calcium ] Other (See Comments)    Elevated muscle enzymes    Lipitor [Atorvastatin  Calcium ] Other (See Comments)    Elevated muscle enzymes      Social History   Socioeconomic History   Marital status: Married    Spouse name: Proofreader   Number of children: 2   Years of education: Not on file   Highest education level: High school graduate  Occupational History   Occupation: Retired    Comment: Semi  Tobacco Use   Smoking status: Former    Current packs/day: 0.00    Average packs/day: 0.5 packs/day for 49.0 years (24.5 ttl pk-yrs)    Types: Cigarettes    Start date: 07/06/1971    Quit date: 06/26/2020    Years since quitting: 3.9   Smokeless tobacco:  Never   Tobacco comments:    Former smoker 09/24/21  Vaping Use   Vaping status: Never Used  Substance and Sexual Activity   Alcohol use: No    Alcohol/week: 0.0 standard drinks of alcohol   Drug use: No   Sexual activity: Not on file  Other Topics Concern   Not on file  Social History Narrative   Married    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    On child    1 son, 1 daughter   Systems Analyst op   Owns guns    Wears seat belt, safe in relationship    Smoker    Retired 06/27/2019   Social Drivers of Corporate Investment Banker Strain: Low Risk  (04/09/2024)   Overall Financial Resource Strain (CARDIA)    Difficulty of Paying Living Expenses: Not hard at all  Food Insecurity: No Food Insecurity (04/09/2024)   Hunger Vital Sign    Worried About Running Out of Food in the Last Year: Never true  Ran Out of Food in the Last Year: Never true  Transportation Needs: No Transportation Needs (04/09/2024)   PRAPARE - Administrator, Civil Service (Medical): No    Lack of Transportation (Non-Medical): No  Physical Activity: Inactive (04/09/2024)   Exercise Vital Sign    Days of Exercise per Week: 0 days    Minutes of Exercise per Session: 0 min  Stress: No Stress Concern Present (04/09/2024)   [employer] of Occupational Health - Occupational Stress Questionnaire    Feeling of Stress: Not at all  Social Connections: Moderately Integrated (04/09/2024)   Social Connection and Isolation Panel    Frequency of Communication with Friends and Family: Once a week    Frequency of Social Gatherings with Friends and Family: More than three times a week    Attends Religious Services: Never    Database Administrator or Organizations: No    Attends Engineer, Structural: More than 4 times per year    Marital Status: Married  Catering Manager Violence: Not At Risk (04/09/2024)   Humiliation, Afraid, Rape, and Kick questionnaire    Fear of Current or Ex-Partner: No    Emotionally Abused: No     Physically Abused: No    Sexually Abused: No    Physical Exam      Future Appointments  Date Time Provider Department Center  06/08/2024  7:00 AM [facility] H[facility] DEVICE REMOTES [facility]  06/14/2024  8:45 AM [facility] LAB [facility] None  06/18/2024  8:00 AM Bair, Luke, MD [facility] [address]  07/09/2024  8:40 AM [name], [name], MD [facility] None  08/23/2024  9:15 AM McCaughan, Dia D, DPM [facility]  04/15/2025  8:10 AM [facility] ANNUAL WELLNESS VISIT [facility] [address]

## 2024-06-08 ENCOUNTER — Ambulatory Visit: Payer: PPO

## 2024-06-08 DIAGNOSIS — I472 Ventricular tachycardia, unspecified: Secondary | ICD-10-CM | POA: Diagnosis not present

## 2024-06-08 LAB — ECHOCARDIOGRAM COMPLETE
AR max vel: 2.61 cm2
AV Area VTI: 2.37 cm2
AV Area mean vel: 2.57 cm2
AV Mean grad: 6 mmHg
AV Peak grad: 10.9 mmHg
Ao pk vel: 1.65 m/s
Area-P 1/2: 2.84 cm2
Calc EF: 55.7 %
S' Lateral: 3.2 cm
Single Plane A2C EF: 57.1 %
Single Plane A4C EF: 54.6 %

## 2024-06-11 ENCOUNTER — Telehealth (HOSPITAL_COMMUNITY): Payer: Self-pay | Admitting: *Deleted

## 2024-06-11 LAB — CUP PACEART REMOTE DEVICE CHECK
Battery Remaining Longevity: 120 mo
Battery Voltage: 3.01 V
Brady Statistic RV Percent Paced: 0.6 %
Date Time Interrogation Session: 20251205012504
HighPow Impedance: 72 Ohm
Implantable Lead Connection Status: 753985
Implantable Lead Implant Date: 20230608
Implantable Lead Location: 753860
Implantable Pulse Generator Implant Date: 20230608
Lead Channel Impedance Value: 323 Ohm
Lead Channel Impedance Value: 399 Ohm
Lead Channel Pacing Threshold Amplitude: 0.75 V
Lead Channel Pacing Threshold Pulse Width: 0.4 ms
Lead Channel Sensing Intrinsic Amplitude: 13.75 mV
Lead Channel Sensing Intrinsic Amplitude: 13.75 mV
Lead Channel Setting Pacing Amplitude: 2 V
Lead Channel Setting Pacing Pulse Width: 0.4 ms
Lead Channel Setting Sensing Sensitivity: 0.3 mV
Zone Setting Status: 755011
Zone Setting Status: 755011

## 2024-06-11 NOTE — Telephone Encounter (Signed)
 Called patient's wife per Beckey Coe, NP with following echo results:  LVEF 60-65%, mild LVH, GIDD, RV normal, no MR, trivial AV regur. No significant changes notes. Will hold off on PET at this time.  Wife verbalized understanding of above. No further questions at this time.

## 2024-06-12 NOTE — Progress Notes (Signed)
 Remote ICD Transmission

## 2024-06-13 ENCOUNTER — Ambulatory Visit: Payer: Self-pay | Admitting: Cardiology

## 2024-06-14 ENCOUNTER — Ambulatory Visit (HOSPITAL_COMMUNITY): Admission: RE | Admit: 2024-06-14 | Discharge: 2024-06-14 | Attending: Internal Medicine

## 2024-06-14 ENCOUNTER — Encounter (HOSPITAL_COMMUNITY): Payer: Self-pay

## 2024-06-14 DIAGNOSIS — I5022 Chronic systolic (congestive) heart failure: Secondary | ICD-10-CM

## 2024-06-14 LAB — BASIC METABOLIC PANEL WITH GFR
Anion gap: 7 (ref 5–15)
BUN: 13 mg/dL (ref 8–23)
CO2: 27 mmol/L (ref 22–32)
Calcium: 9.1 mg/dL (ref 8.9–10.3)
Chloride: 104 mmol/L (ref 98–111)
Creatinine, Ser: 1.2 mg/dL (ref 0.61–1.24)
GFR, Estimated: >60 mL/min (ref >60)
Glucose, Bld: 117 mg/dL — ABNORMAL HIGH (ref 70–99)
Potassium: 3.9 mmol/L (ref 3.5–5.1)
Sodium: 138 mmol/L (ref 135–145)

## 2024-06-18 ENCOUNTER — Ambulatory Visit (INDEPENDENT_AMBULATORY_CARE_PROVIDER_SITE_OTHER)

## 2024-06-18 ENCOUNTER — Encounter

## 2024-06-18 ENCOUNTER — Ambulatory Visit

## 2024-06-18 VITALS — BP 130/76 | HR 76 | Temp 98.3°F | Ht >= 80 in | Wt 251.8 lb

## 2024-06-18 DIAGNOSIS — D649 Anemia, unspecified: Secondary | ICD-10-CM

## 2024-06-18 DIAGNOSIS — E782 Mixed hyperlipidemia: Secondary | ICD-10-CM

## 2024-06-18 DIAGNOSIS — D8685 Sarcoid myocarditis: Secondary | ICD-10-CM | POA: Diagnosis not present

## 2024-06-18 DIAGNOSIS — R21 Rash and other nonspecific skin eruption: Secondary | ICD-10-CM

## 2024-06-18 DIAGNOSIS — R7989 Other specified abnormal findings of blood chemistry: Secondary | ICD-10-CM

## 2024-06-18 DIAGNOSIS — I10 Essential (primary) hypertension: Secondary | ICD-10-CM | POA: Diagnosis not present

## 2024-06-18 DIAGNOSIS — M5416 Radiculopathy, lumbar region: Secondary | ICD-10-CM | POA: Diagnosis not present

## 2024-06-18 DIAGNOSIS — I5042 Chronic combined systolic (congestive) and diastolic (congestive) heart failure: Secondary | ICD-10-CM | POA: Diagnosis not present

## 2024-06-18 LAB — CBC WITH DIFFERENTIAL/PLATELET
Basophils Absolute: 0 K/uL (ref 0.0–0.1)
Basophils Relative: 0.6 % (ref 0.0–3.0)
Eosinophils Absolute: 0.1 K/uL (ref 0.0–0.7)
Eosinophils Relative: 2 % (ref 0.0–5.0)
HCT: 37.4 % — ABNORMAL LOW (ref 39.0–52.0)
Hemoglobin: 12.3 g/dL — ABNORMAL LOW (ref 13.0–17.0)
Lymphocytes Relative: 22.9 % (ref 12.0–46.0)
Lymphs Abs: 1.2 K/uL (ref 0.7–4.0)
MCHC: 33 g/dL (ref 30.0–36.0)
MCV: 98.4 fl (ref 78.0–100.0)
Monocytes Absolute: 0.5 K/uL (ref 0.1–1.0)
Monocytes Relative: 9.6 % (ref 3.0–12.0)
Neutro Abs: 3.5 K/uL (ref 1.4–7.7)
Neutrophils Relative %: 64.9 % (ref 43.0–77.0)
Platelets: 273 K/uL (ref 150.0–400.0)
RBC: 3.81 Mil/uL — ABNORMAL LOW (ref 4.22–5.81)
RDW: 13.9 % (ref 11.5–15.5)
WBC: 5.3 K/uL (ref 4.0–10.5)

## 2024-06-18 LAB — COMPREHENSIVE METABOLIC PANEL WITH GFR
ALT: 21 U/L (ref 0–53)
AST: 22 U/L (ref 0–37)
Albumin: 4.3 g/dL (ref 3.5–5.2)
Alkaline Phosphatase: 82 U/L (ref 39–117)
BUN: 15 mg/dL (ref 6–23)
CO2: 31 meq/L (ref 19–32)
Calcium: 9.3 mg/dL (ref 8.4–10.5)
Chloride: 102 meq/L (ref 96–112)
Creatinine, Ser: 1.16 mg/dL (ref 0.40–1.50)
GFR: 61.99 mL/min (ref >60.00)
Glucose, Bld: 98 mg/dL (ref 70–99)
Potassium: 3.9 meq/L (ref 3.5–5.1)
Sodium: 140 meq/L (ref 135–145)
Total Bilirubin: 0.4 mg/dL (ref 0.2–1.2)
Total Protein: 7 g/dL (ref 6.0–8.3)

## 2024-06-18 MED ORDER — GABAPENTIN 300 MG PO CAPS: 600.0000 mg | ORAL_CAPSULE | Freq: Every day | ORAL | 1 refills | Status: AC

## 2024-06-18 MED ORDER — KETOCONAZOLE 2 % EX CREA: 1.0000 | TOPICAL_CREAM | Freq: Two times a day (BID) | CUTANEOUS | 2 refills | Status: AC | PRN

## 2024-06-18 NOTE — Assessment & Plan Note (Addendum)
 H/O statin myopathy, continue Zetia  10 mg daily. Continue f/u with cardiology.

## 2024-06-18 NOTE — Patient Instructions (Addendum)
-   Please reduce Gabapentin  from taking 3 capsules at night to 2 capsules at night to reduce risk of side effects.  - Apply Ketoconazole  cream 2%, twice a day for 3-4 weeks or till the rash is fully gone. If the rash on buttock does not go away with this cream we will need you to see a skin doctor.  - Stop taking Ferrous sulphate or iron tablet.  - Follow up with Dr. Abbey in 4-5 months.

## 2024-06-18 NOTE — Assessment & Plan Note (Addendum)
 Continue f/u with cardiology and rheumatologist. Taking Methotrexate  as prescribed by cardiology.

## 2024-06-18 NOTE — Assessment & Plan Note (Addendum)
 Management per cardiology, taking medication as prescribed. Has f/u in 07/2024. Unable to tolerate Farxiga  due to h/o recurrent UTI when taking Farxiga .

## 2024-06-18 NOTE — Assessment & Plan Note (Addendum)
 BP within goal. No chest pain, lower leg edema.  Continue cardiology follow up.  Continue medications as recommended by cardiology:  Coreg  12.5 mg BID, spironolactone  25 mg daily, Entresto  24/26 mg bid. Check cmp  Orders:   Comp Met (CMET)

## 2024-06-18 NOTE — Progress Notes (Signed)
 Established Patient Office Visit   Subjective  Patient ID: John Keith, male    DOB: Apr 23, 1951  Age: 73 y.o. MRN: [id number]  Chief Complaint  Patient presents with   Gastroesophageal Reflux   Rash    Discussed the use of AI scribe software for clinical note transcription with the patient, who gave verbal consent to proceed.  History of Present Illness John Keith is a 73 year old male with heart failure and sarcoidosis who presents for medication management and follow-up.  - Pruritus rash on b/l buttock, right worse than left. Previously treated with Ketoconazole  shampoo which helped.   - Last OV on 04/05/24: Right index finger, skin picking: Was recommended to soak hands in arm water twice a day, 1% diclofenac  gel to help with pain. Since his last visit this has resolved.  - Lower back pain, radiating down to left side: Pain not as noticeable, uncertain if Gabapentin  900 mg at bedtime is helping with the pain. Works full time at [employer]. Previous episode of feeling dizziness and is interested in going down on Gabapentin .   - GERD: Stale on  Protonix  40 mg daily.   - Chronic systolic CHF/NICM in setting of cardiac sarcoidosis - H/o ventricular tachycardia with defibrillator in place  - H/O atrial fibrillation with atrial flutter Following up with cardiology, last visit on 06/05/24 with NP Hayes, 6weeks f/u with Dr. [name] recommended . Previously taking Farxiga , stopped due to UTI x 2.  Taking Methotrexate  20 mg once a week.  On spironolactone  25 mg daily, Entresto  24/26 mg bid, Eliquis , zetia  ,  Coreg  recently recommended to increase from 6.25 mg BID to 12.5 mg BID after remote defibrillator interrogation with VT episodes. He is taking 2 tablets of current Coreg  prescription 6.25 mg BID.   He also follows up with Rheumatologist: Dr. Lynwood Hives.   H/o smoking, stopped in 2021. CT abdomen and pelvis done on 12/21/23 showed patchy ground glass opacity,  repeat recommended. Repeat chest CT from 04/26/2024 was reassuring and annual low dose lung cancer screening recommended by reading radiologist. CT also showed 4.2 cm ascending thoracic aorta, one year ct lungs recommended. Moderate colonic diverticulosis, aortic atherosclerosis and emphysema. No chest pain, palpitations, dizziness, weakness, lower leg edema.   - Since his last visit he was found to have Citrobacter koseri positive UTI and was seen at [facility], treated with Bactrim  DS twice a day for 7 days on 05/10/24. No urinary symptoms today.   - CBC: 01/19/24 Normocytic normochromic anemia with hemoglobin of 11.6, B12 normal. Release CBC from 04/05/24.        ROS As per HPI    Objective:     BP 130/76 (BP Location: Right Arm, Patient Position: Sitting, Cuff Size: Normal)   Pulse 76   Temp 98.3 F (36.8 C) (Oral)   Ht 6' 9 (2.057 m)   Wt 251 lb 12.8 oz (114.2 kg)   SpO2 99%   BMI 26.98 kg/m       06/18/2024    8:21 AM 04/09/2024    8:49 AM 04/05/2024    8:16 AM  Depression screen PHQ 2/9  Decreased Interest 0 0 0  Down, Depressed, Hopeless 0 0 0  PHQ - 2 Score 0 0 0  Altered sleeping 0 0 0  Tired, decreased energy 0 0 0  Change in appetite 2 0 0  Feeling bad or failure about yourself  0 0 0  Trouble concentrating 0 0 0  Moving slowly or fidgety/restless 0 0 0  Suicidal thoughts 0 0 0  PHQ-9 Score 2 0  0   Difficult doing work/chores Not difficult at all Not difficult at all Not difficult at all     Data saved with a previous flowsheet row definition      06/18/2024    8:21 AM 04/05/2024    8:17 AM 01/04/2024    3:48 PM 08/29/2023    8:03 AM  GAD 7 : Generalized Anxiety Score  Nervous, Anxious, on Edge 0 0 0 0  Control/stop worrying 0 0 0 0  Worry too much - different things 0 0 0 0  Trouble relaxing 0 0 0 0  Restless 0 0 0 0  Easily annoyed or irritable 0 0 0 0  Afraid - awful might happen 0 0 0 0  Total GAD 7 Score 0 0 0 0  Anxiety Difficulty Not difficult at all  Not difficult at all Not difficult at all Not difficult at all      06/18/2024    8:21 AM 04/09/2024    8:49 AM 04/05/2024    8:16 AM  Depression screen PHQ 2/9  Decreased Interest 0 0 0  Down, Depressed, Hopeless 0 0 0  PHQ - 2 Score 0 0 0  Altered sleeping 0 0 0  Tired, decreased energy 0 0 0  Change in appetite 2 0 0  Feeling bad or failure about yourself  0 0 0  Trouble concentrating 0 0 0  Moving slowly or fidgety/restless 0 0 0  Suicidal thoughts 0 0 0  PHQ-9 Score 2 0  0   Difficult doing work/chores Not difficult at all Not difficult at all Not difficult at all     Data saved with a previous flowsheet row definition      06/18/2024    8:21 AM 04/05/2024    8:17 AM 01/04/2024    3:48 PM 08/29/2023    8:03 AM  GAD 7 : Generalized Anxiety Score  Nervous, Anxious, on Edge 0 0 0 0  Control/stop worrying 0 0 0 0  Worry too much - different things 0 0 0 0  Trouble relaxing 0 0 0 0  Restless 0 0 0 0  Easily annoyed or irritable 0 0 0 0  Afraid - awful might happen 0 0 0 0  Total GAD 7 Score 0 0 0 0  Anxiety Difficulty Not difficult at all Not difficult at all Not difficult at all Not difficult at all   SDOH Screenings   Food Insecurity: No Food Insecurity (04/09/2024)  Housing: Unknown (04/09/2024)  Transportation Needs: No Transportation Needs (04/09/2024)  Utilities: Not At Risk (04/09/2024)  Alcohol Screen: Low Risk (04/09/2024)  Depression (PHQ2-9): Low Risk (06/18/2024)  Financial Resource Strain: Low Risk (04/09/2024)  Physical Activity: Inactive (04/09/2024)  Social Connections: Moderately Integrated (04/09/2024)  Stress: No Stress Concern Present (04/09/2024)  Tobacco Use: Medium Risk (06/18/2024)  Health Literacy: Adequate Health Literacy (04/09/2024)     Physical Exam Constitutional:      General: He is not in acute distress.    Appearance: Normal appearance.  HENT:     Head: Normocephalic and atraumatic.     Right Ear: Tympanic membrane normal.     Left Ear:  Tympanic membrane normal.     Mouth/Throat:     Mouth: Mucous membranes are moist.     Pharynx: No oropharyngeal exudate.  Neck:     Thyroid : No thyroid  mass or thyroid  tenderness.  Cardiovascular:     Rate and Rhythm: Normal rate and regular rhythm.  Pulmonary:     Effort: Pulmonary effort is normal.     Breath sounds: Normal breath sounds. No wheezing.  Abdominal:     General: Bowel sounds are normal.     Palpations: Abdomen is soft.     Tenderness: There is no guarding.  Musculoskeletal:     Cervical back: Neck supple. No rigidity.     Lumbar back: Negative right straight leg raise test and negative left straight leg raise test.     Right lower leg: No edema.     Left lower leg: No edema.  Skin:    General: Skin is warm.     Findings: Rash (bilateral buttock with ring papular lesion) present.  Neurological:     Mental Status: He is alert and oriented to person, place, and time.  Psychiatric:        Mood and Affect: Mood normal.        Behavior: Behavior normal.        No results found for any visits on 06/18/24.  The ASCVD Risk score (Arnett DK, et al., 2019) failed to calculate for the following reasons:   Risk score cannot be calculated because patient has a medical history suggesting prior/existing ASCVD   * - Cholesterol units were assumed     Assessment & Plan:   Assessment & Plan Essential hypertension BP within goal. No chest pain, lower leg edema.  Continue cardiology follow up.  Continue medications as recommended by cardiology:  Coreg  12.5 mg BID, spironolactone  25 mg daily, Entresto  24/26 mg bid. Check cmp  Orders:   Comp Met (CMET)  Elevated ferritin Patient is taking iron supplement daily. Has sarcoidosis, elevated ferritin likely secondary to ongoing autoimmune disease. Recommend to stop taking daily iron supplement. Repeat CBC, iron panel today.  Orders:   Iron, TIBC and Ferritin Panel   CBC w/Diff  Lumbar radiculopathy Negative straight  leg test today.  On his feet, working at [employer] daily.  Reduce Gabapentin  300 mg, 2 capsules at night time to help reduce risk of fall, dementia when still managing lumbar radiculopathy.  Patient agrees. Potential s/e from reducing dose like worsening lower back pain, sleep disturbance discussed.  Reduced gabapentin  to 600 mg at night. Apply diclofenac  gel if pain persists. Consider physical therapy if pain worsens. Use heating or cooling pad for pain relief. Orders:   gabapentin  (NEURONTIN ) 300 MG capsule; Take 2 capsules (600 mg total) by mouth at bedtime.  Rash On bilateral gluteal region, suspicious for tinea infection.  Apply ketoconazole  cream 2% twice a day for 3-4 weeks or until resolved. Follow up with dermatologist if unresolved.    Normocytic normochromic anemia Plan per elevated ferritin.  Continue daily multivitamin.     Mixed hyperlipidemia H/O statin myopathy, continue Zetia  10 mg daily. Continue f/u with cardiology.     Chronic combined systolic and diastolic CHF (congestive heart failure) (HCC) Management per cardiology, taking medication as prescribed. Has f/u in 07/2024. Unable to tolerate Farxiga  due to h/o recurrent UTI when taking Farxiga .     Cardiac sarcoidosis Continue f/u with cardiology and rheumatologist. Taking Methotrexate  as prescribed by cardiology.       Return in about 4 months (around 10/17/2024) for 4-5  months for chornic follow up .   Luke Shade, MD

## 2024-06-18 NOTE — Assessment & Plan Note (Addendum)
 Patient is taking iron supplement daily. Has sarcoidosis, elevated ferritin likely secondary to ongoing autoimmune disease. Recommend to stop taking daily iron supplement. Repeat CBC, iron panel today.  Orders:   Iron, TIBC and Ferritin Panel   CBC w/Diff

## 2024-06-18 NOTE — Assessment & Plan Note (Addendum)
 On bilateral gluteal region, suspicious for tinea infection.  Apply ketoconazole  cream 2% twice a day for 3-4 weeks or until resolved. Follow up with dermatologist if unresolved.

## 2024-06-18 NOTE — Assessment & Plan Note (Addendum)
 Plan per elevated ferritin.  Continue daily multivitamin.

## 2024-06-18 NOTE — Assessment & Plan Note (Addendum)
 Negative straight leg test today.  On his feet, working at [employer] daily.  Reduce Gabapentin  300 mg, 2 capsules at night time to help reduce risk of fall, dementia when still managing lumbar radiculopathy.  Patient agrees. Potential s/e from reducing dose like worsening lower back pain, sleep disturbance discussed.  Reduced gabapentin  to 600 mg at night. Apply diclofenac  gel if pain persists. Consider physical therapy if pain worsens. Use heating or cooling pad for pain relief. Orders:   gabapentin  (NEURONTIN ) 300 MG capsule; Take 2 capsules (600 mg total) by mouth at bedtime.

## 2024-06-19 LAB — IRON,TIBC AND FERRITIN PANEL
%SAT: 26 % (ref 20–48)
Ferritin: 277 ng/mL (ref 24–380)
Iron: 93 ug/dL (ref 50–180)
TIBC: 357 ug/dL (ref 250–425)

## 2024-06-19 NOTE — Progress Notes (Signed)
 Last mychart login: 12/21/23 Please reach out to the patient to let him know his CBC shows improvement in anemia. Iron panel is stable so he does not need oral iron as discussed during his visit with me on 12/15. Kidney, liver function looks good.   Thank you,  Luke Shade, MD

## 2024-06-21 ENCOUNTER — Other Ambulatory Visit (HOSPITAL_COMMUNITY): Payer: Self-pay

## 2024-06-21 NOTE — Progress Notes (Signed)
 Paramedicine Encounter    Patient ID: John Keith, male    DOB: May 19, 1951, 73 y.o.   MRN: [id number]   Complaints- none   Assessment- CAOx4, warm and dry ambulatory inside the home without complaints.   Compliance with meds- no missed doses of meds   Pill box filled- for two weeks   Refills needed- eliquis , methotrexate    Meds changes since last visit- none (carvedilol  listed wrongly in his chart there has been no dose change to 12.5mg  he is on 6.25mg )     Social changes-none    VISIT SUMMARY- Arrived for home visit for John Keith who reports to be feeling well with no complaints today. He denied any fatigue, shortness of breath, dizziness or chest pain. Lungs clear. Vitals within normal limit. No weight gain. No lower leg swelling. No abdominal bloating. Urine non cloudy- non odored. He is med compliant. He denied any pain. I reviewed recent noted from PCP- she logged carvedilol  as 12.5mg  but it is 6.25mg  cardiology did not increase his dose at recent visit. I will have them fix this at clinic visit upcoming. He is still taking 6.25mg  BID. Meds reviewed and pill boxes filled for two weeks. Home visit complete. I will see Trevonn in two weeks on 12/31.   BP 102/64   Pulse 78   Resp 16   Wt 239 lb (108.4 kg)   SpO2 94%   BMI 25.61 kg/m  Weight yesterday-240lbs  Last visit weight--240lbs      ACTION: Home visit completed     Patient Care Team: Bair, Kalpana, MD as PCP - General (Family Medicine) [name], MD as PCP - Electrophysiology (Cardiology) [name], [name], MD as PCP - Cardiology (Cardiology) [name], MD as Consulting Physician (Internal Medicine) [name], MD as Attending Physician (Cardiology) [name], MD as Consulting Physician (Cardiology)  Patient Active Problem List   Diagnosis Date Noted   Ascending aorta dilation 05/01/2024   Emphysema lung (HCC) 05/01/2024   Cardiac sarcoidosis 01/04/2024   Primary  osteoarthritis 01/04/2024   Presence of heart assist device (HCC) 01/04/2024   Rash 01/04/2024   On anticoagulant therapy 08/29/2023   On amiodarone  therapy 08/29/2023   Statin myopathy 11/15/2022   Encounter for screening for lung cancer 11/15/2022   Chronic systolic heart failure (HCC) 12/15/2021   Chronic combined systolic and diastolic CHF (congestive heart failure) (HCC) 12/15/2021   Ventricular tachycardia (HCC) 12/07/2021   Typical atrial flutter (HCC) 09/24/2021   Secondary hypercoagulable state 09/24/2021   Abnormal MRI, lumbar spine 10/23/2020   Chronic low back pain 10/23/2020   Atherosclerosis of native coronary artery of native heart without angina pectoris 10/23/2020   Impingement syndrome of left shoulder region 08/22/2020   Pain in joint of left shoulder 07/24/2020   Chronic left shoulder pain 07/11/2020   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   Normocytic normochromic anemia 12/13/2019   PVC (premature ventricular contraction) 10/24/2019   Ventricular premature beats 10/24/2019   Elevated ferritin 07/07/2019   Postherpetic neuralgia 08/17/2018   Benign prostatic hyperplasia 08/01/2018   Hip pain 06/29/2018   Aortic atherosclerosis 09/22/2016   Lower urinary tract symptoms (LUTS) 06/03/2016   Lumbar radiculopathy 02/20/2016   Insomnia 06/10/2015   Health care maintenance 01/09/2014   Hyperlipidemia 11/09/2012   Essential hypertension 11/06/2012   Seasonal allergies 11/06/2012   Erectile dysfunction 11/06/2012   GERD (gastroesophageal reflux disease) 11/06/2012   Lactose intolerance 11/06/2012   Diverticulosis of colon without hemorrhage 11/06/2012   Degeneration of  thoracolumbar intervertebral disc 11/06/2012   Current Medications[1] Allergies[2]   Social History   Socioeconomic History   Marital status: Married    Spouse name: Proofreader   Number of children: 2   Years of education: Not on file   Highest education level: High school graduate   Occupational History   Occupation: Retired    Comment: Semi  Tobacco Use   Smoking status: Former    Current packs/day: 0.00    Average packs/day: 0.5 packs/day for 49.0 years (24.5 ttl pk-yrs)    Types: Cigarettes    Start date: 07/06/1971    Quit date: 06/26/2020    Years since quitting: 3.9   Smokeless tobacco: Never   Tobacco comments:    Former smoker 09/24/21  Vaping Use   Vaping status: Never Used  Substance and Sexual Activity   Alcohol use: No    Alcohol/week: 0.0 standard drinks of alcohol   Drug use: No   Sexual activity: Not on file  Other Topics Concern   Not on file  Social History Narrative   Married    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    On child    1 son, 1 daughter   Machine op   Owns guns    Wears seat belt, safe in relationship    Smoker    Retired 06/27/2019   Social Drivers of Health   Tobacco Use: Medium Risk (06/18/2024)   Patient History    Smoking Tobacco Use: Former    Smokeless Tobacco Use: Never    Passive Exposure: Not on Actuary Strain: Low Risk (04/09/2024)   Overall Financial Resource Strain (CARDIA)    Difficulty of Paying Living Expenses: Not hard at all  Food Insecurity: No Food Insecurity (04/09/2024)   Epic    Worried About Programme Researcher, Broadcasting/film/video in the Last Year: Never true    Ran Out of Food in the Last Year: Never true  Transportation Needs: No Transportation Needs (04/09/2024)   Epic    Lack of Transportation (Medical): No    Lack of Transportation (Non-Medical): No  Physical Activity: Inactive (04/09/2024)   Exercise Vital Sign    Days of Exercise per Week: 0 days    Minutes of Exercise per Session: 0 min  Stress: No Stress Concern Present (04/09/2024)   [employer] of Occupational Health - Occupational Stress Questionnaire    Feeling of Stress: Not at all  Social Connections: Moderately Integrated (04/09/2024)   Social Connection and Isolation Panel    Frequency of Communication with Friends and Family: Once a week     Frequency of Social Gatherings with Friends and Family: More than three times a week    Attends Religious Services: Never    Database Administrator or Organizations: No    Attends Engineer, Structural: More than 4 times per year    Marital Status: Married  Catering Manager Violence: Not At Risk (04/09/2024)   Epic    Fear of Current or Ex-Partner: No    Emotionally Abused: No    Physically Abused: No    Sexually Abused: No  Depression (PHQ2-9): Low Risk (06/18/2024)   Depression (PHQ2-9)    PHQ-2 Score: 2  Alcohol Screen: Low Risk (04/09/2024)   Alcohol Screen    Last Alcohol Screening Score (AUDIT): 0  Housing: Unknown (04/09/2024)   Epic    Unable to Pay for Housing in the Last Year: No    Number of Times Moved in the Last Year:  Not on file    Homeless in the Last Year: No  Utilities: Not At Risk (04/09/2024)   Epic    Threatened with loss of utilities: No  Health Literacy: Adequate Health Literacy (04/09/2024)   B1300 Health Literacy    Frequency of need for help with medical instructions: Never    Physical Exam      Future Appointments  Date Time Provider Department Center  07/09/2024  8:40 AM [name], [name], MD [facility] None  08/23/2024  9:15 AM McCaughan, Dia D, DPM [facility]  09/07/2024  7:00 AM [facility] HVT DEVICE REMOTES [facility]  10/19/2024  8:00 AM Bair, Luke, MD [facility] [address]  12/07/2024  7:00 AM [facility] HVT DEVICE REMOTES [facility]  03/08/2025  7:00 AM [facility] HVT DEVICE REMOTES [facility]  04/15/2025  8:10 AM [facility] ANNUAL WELLNESS VISIT [facility] [address]  06/07/2025  7:00 AM [facility] HVT DEVICE REMOTES [facility]  09/06/2025  7:00 AM [facility] HVT DEVICE REMOTES [facility]                 [1]  Current Outpatient Medications:    apixaban  (ELIQUIS ) 5 MG TABS tablet, TAKE 1 TABLET(5 MG) BY MOUTH TWICE DAILY, Disp: 60 tablet, Rfl: 11   calcium  carbonate (OS-CAL - DOSED IN MG OF ELEMENTAL CALCIUM ) 1250 (500 Ca) MG tablet,  Take 1 tablet (500 mg of elemental calcium  total) by mouth daily with breakfast., Disp: 30 tablet, Rfl: 11   carvedilol  (COREG ) 12.5 MG tablet, Take 6.25 mg by mouth 2 (two) times daily with a meal. Taking 6.25mg  per Beckey Coe NP at [facility], Disp: , Rfl:    Cholecalciferol  (VITAMIN D -3) 125 MCG (5000 UT) TABS, Take 1 tablet by mouth daily., Disp: 30 tablet, Rfl: 11   ezetimibe  (ZETIA ) 10 MG tablet, Take 1 tablet (10 mg total) by mouth daily., Disp: 90 tablet, Rfl: 3   folic acid  (FOLVITE ) 1 MG tablet, Take 1 tablet (1 mg total) by mouth daily., Disp: 30 tablet, Rfl: 6   furosemide  (LASIX ) 40 MG tablet, Take 1 tablet (40 mg total) by mouth daily., Disp: 90 tablet, Rfl: 3   gabapentin  (NEURONTIN ) 300 MG capsule, Take 2 capsules (600 mg total) by mouth at bedtime., Disp: 180 capsule, Rfl: 1   ketoconazole  (NIZORAL ) 2 % cream, Apply 1 Application topically 2 (two) times daily as needed for irritation., Disp: 60 g, Rfl: 2   methotrexate  (RHEUMATREX) 2.5 MG tablet, TAKE 20MG (8 TABLETS) EVERY WEDNESDAY. PROTECT FROM LIGHT AS DIRECTED, Disp: 150 tablet, Rfl: 0   Multiple Vitamin (MULTIVITAMIN) tablet, Take 1 tablet by mouth daily., Disp: , Rfl:    pantoprazole  (PROTONIX ) 40 MG tablet, Take 1 tablet (40 mg total) by mouth daily. 30 min before food, Disp: 30 tablet, Rfl: 6   sacubitril -valsartan  (ENTRESTO ) 24-26 MG, Take 1 tablet by mouth 2 (two) times daily., Disp: 180 tablet, Rfl: 3   spironolactone  (ALDACTONE ) 25 MG tablet, Take 1 tablet (25 mg total) by mouth daily., Disp: 90 tablet, Rfl: 3 [2]  Allergies Allergen Reactions   Crestor  [Rosuvastatin  Calcium ] Other (See Comments)    Elevated muscle enzymes    Lipitor [Atorvastatin  Calcium ] Other (See Comments)    Elevated muscle enzymes

## 2024-06-25 ENCOUNTER — Encounter: Payer: Self-pay | Admitting: Internal Medicine

## 2024-07-02 ENCOUNTER — Telehealth (HOSPITAL_COMMUNITY): Payer: Self-pay

## 2024-07-02 NOTE — Telephone Encounter (Signed)
 HF Paramedicine Message to [facility]  Pharmacy (if applicable): [facility]    Issue/reason for call: Refill for Methotrexate    Medication refill? Methotrexate    [name], EMT-Paramedic [phone] 07/02/2024

## 2024-07-02 NOTE — Telephone Encounter (Signed)
 John Keith!  Dr. Mai is a rheumatologist at [facility].  Wash has never been seen in our office here at [facility].   Thanks!

## 2024-07-04 ENCOUNTER — Other Ambulatory Visit (HOSPITAL_COMMUNITY): Payer: Self-pay

## 2024-07-04 NOTE — Progress Notes (Signed)
 Paramedicine Encounter    Patient ID: John Keith, male    DOB: Nov 21, 1950, 73 y.o.   MRN: [id number]   Complaints- none   Assessment- CAOX4, warm and dry seated at the kitchen table but denied any complaints, lungs clear, vitals obtained and within normal limits. No lower leg edema.   Compliance with meds- no missed doses   Pill box filled- for two weeks  Refills needed- methotrexate  (will call Dr. Eustacio office on Monday for refills)   Meds changes since last visit- none     Social changes- none    VISIT SUMMARY- Arrived for home visit for John Keith who reports to be feeling well with no complaints. Vitals and assessment obtained and reviewed. He says he has intermittent acid reflux pain and is taking OTC famotidine  for same and it helps. Meds reviewed and confirmed. Pill box filled for two weeks. We discussed HF education and upcoming appointments. Appointments wrote down and confirmed. Home visit complete. I will follow up in clinic on Monday.   BP 110/60   Pulse 80   Resp 16   Wt 238 lb (108 kg)   SpO2 96%   BMI 25.50 kg/m  Weight yesterday-- 237  Last visit weight-- 239      ACTION: Home visit completed     Patient Care Team: Abbey Bruckner, MD as PCP - General (Family Medicine) [name], MD as PCP - Electrophysiology (Cardiology) [name], [name], MD as PCP - Cardiology (Cardiology) [name], MD as Consulting Physician (Internal Medicine) [name], MD as Attending Physician (Cardiology) [name], MD as Consulting Physician (Cardiology)  Patient Active Problem List   Diagnosis Date Noted   Ascending aorta dilation 05/01/2024   Emphysema lung (HCC) 05/01/2024   Cardiac sarcoidosis 01/04/2024   Primary osteoarthritis 01/04/2024   Presence of heart assist device (HCC) 01/04/2024   Rash 01/04/2024   On anticoagulant therapy 08/29/2023   On amiodarone  therapy 08/29/2023   Statin myopathy 11/15/2022   Encounter  for screening for lung cancer 11/15/2022   Chronic systolic heart failure (HCC) 12/15/2021   Chronic combined systolic and diastolic CHF (congestive heart failure) (HCC) 12/15/2021   Ventricular tachycardia (HCC) 12/07/2021   Typical atrial flutter (HCC) 09/24/2021   Secondary hypercoagulable state 09/24/2021   Abnormal MRI, lumbar spine 10/23/2020   Chronic low back pain 10/23/2020   Atherosclerosis of native coronary artery of native heart without angina pectoris 10/23/2020   Impingement syndrome of left shoulder region 08/22/2020   Pain in joint of left shoulder 07/24/2020   Chronic left shoulder pain 07/11/2020   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   Normocytic normochromic anemia 12/13/2019   PVC (premature ventricular contraction) 10/24/2019   Ventricular premature beats 10/24/2019   Elevated ferritin 07/07/2019   Postherpetic neuralgia 08/17/2018   Benign prostatic hyperplasia 08/01/2018   Hip pain 06/29/2018   Aortic atherosclerosis 09/22/2016   Lower urinary tract symptoms (LUTS) 06/03/2016   Lumbar radiculopathy 02/20/2016   Insomnia 06/10/2015   Health care maintenance 01/09/2014   Hyperlipidemia 11/09/2012   Essential hypertension 11/06/2012   Seasonal allergies 11/06/2012   Erectile dysfunction 11/06/2012   GERD (gastroesophageal reflux disease) 11/06/2012   Lactose intolerance 11/06/2012   Diverticulosis of colon without hemorrhage 11/06/2012   Degeneration of thoracolumbar intervertebral disc 11/06/2012   Current Medications[1] Allergies[2]   Social History   Socioeconomic History   Marital status: Married    Spouse name: Gwendolyn   Number of children: 2   Years of education: Not  on file   Highest education level: High school graduate  Occupational History   Occupation: Retired    Comment: Semi  Tobacco Use   Smoking status: Former    Current packs/day: 0.00    Average packs/day: 0.5 packs/day for 49.0 years (24.5 ttl pk-yrs)    Types: Cigarettes     Start date: 07/06/1971    Quit date: 06/26/2020    Years since quitting: 4.0   Smokeless tobacco: Never   Tobacco comments:    Former smoker 09/24/21  Vaping Use   Vaping status: Never Used  Substance and Sexual Activity   Alcohol use: No    Alcohol/week: 0.0 standard drinks of alcohol   Drug use: No   Sexual activity: Not on file  Other Topics Concern   Not on file  Social History Narrative   Married    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    On child    1 son, 1 daughter   Machine op   Owns guns    Wears seat belt, safe in relationship    Smoker    Retired 06/27/2019   Social Drivers of Health   Tobacco Use: Medium Risk (06/18/2024)   Patient History    Smoking Tobacco Use: Former    Smokeless Tobacco Use: Never    Passive Exposure: Not on Actuary Strain: Low Risk (04/09/2024)   Overall Financial Resource Strain (CARDIA)    Difficulty of Paying Living Expenses: Not hard at all  Food Insecurity: No Food Insecurity (04/09/2024)   Epic    Worried About Programme Researcher, Broadcasting/film/video in the Last Year: Never true    Ran Out of Food in the Last Year: Never true  Transportation Needs: No Transportation Needs (04/09/2024)   Epic    Lack of Transportation (Medical): No    Lack of Transportation (Non-Medical): No  Physical Activity: Inactive (04/09/2024)   Exercise Vital Sign    Days of Exercise per Week: 0 days    Minutes of Exercise per Session: 0 min  Stress: No Stress Concern Present (04/09/2024)   [employer] of Occupational Health - Occupational Stress Questionnaire    Feeling of Stress: Not at all  Social Connections: Moderately Integrated (04/09/2024)   Social Connection and Isolation Panel    Frequency of Communication with Friends and Family: Once a week    Frequency of Social Gatherings with Friends and Family: More than three times a week    Attends Religious Services: Never    Database Administrator or Organizations: No    Attends Engineer, Structural: More than 4  times per year    Marital Status: Married  Catering Manager Violence: Not At Risk (04/09/2024)   Epic    Fear of Current or Ex-Partner: No    Emotionally Abused: No    Physically Abused: No    Sexually Abused: No  Depression (PHQ2-9): Low Risk (06/18/2024)   Depression (PHQ2-9)    PHQ-2 Score: 2  Alcohol Screen: Low Risk (04/09/2024)   Alcohol Screen    Last Alcohol Screening Score (AUDIT): 0  Housing: Unknown (04/09/2024)   Epic    Unable to Pay for Housing in the Last Year: No    Number of Times Moved in the Last Year: Not on file    Homeless in the Last Year: No  Utilities: Not At Risk (04/09/2024)   Epic    Threatened with loss of utilities: No  Health Literacy: Adequate Health Literacy (04/09/2024)   B1300  Health Literacy    Frequency of need for help with medical instructions: Never    Physical Exam      Future Appointments  Date Time Provider Department Center  07/09/2024  8:40 AM [name], [name], MD [facility] None  08/14/2024  8:00 AM Leverne Charlies Helling, PA-C [facility]  08/23/2024  9:15 AM McCaughan, Dia D, DPM [facility]  09/07/2024  7:00 AM CVD HVT DEVICE REMOTES [facility]  10/19/2024  8:00 AM Bair, Luke, MD [facility] [address]  12/07/2024  7:00 AM CVD HVT DEVICE REMOTES [facility]  03/08/2025  7:00 AM CVD HVT DEVICE REMOTES [facility]  04/15/2025  8:10 AM [facility] ANNUAL WELLNESS VISIT [facility] [address]  06/07/2025  7:00 AM CVD HVT DEVICE REMOTES [facility]  09/06/2025  7:00 AM CVD HVT DEVICE REMOTES [facility]                 [1]  Current Outpatient Medications:    apixaban  (ELIQUIS ) 5 MG TABS tablet, TAKE 1 TABLET(5 MG) BY MOUTH TWICE DAILY, Disp: 60 tablet, Rfl: 11   calcium  carbonate (OS-CAL - DOSED IN MG OF ELEMENTAL CALCIUM ) 1250 (500 Ca) MG tablet, Take 1 tablet (500 mg of elemental calcium  total) by mouth daily with breakfast., Disp: 30 tablet, Rfl: 11   carvedilol  (COREG ) 12.5 MG tablet, Take 6.25 mg by mouth 2  (two) times daily with a meal. Taking 6.25mg  per Beckey Coe NP at [facility], Disp: , Rfl:    Cholecalciferol  (VITAMIN D -3) 125 MCG (5000 UT) TABS, Take 1 tablet by mouth daily., Disp: 30 tablet, Rfl: 11   ezetimibe  (ZETIA ) 10 MG tablet, Take 1 tablet (10 mg total) by mouth daily., Disp: 90 tablet, Rfl: 3   folic acid  (FOLVITE ) 1 MG tablet, Take 1 tablet (1 mg total) by mouth daily., Disp: 30 tablet, Rfl: 6   furosemide  (LASIX ) 40 MG tablet, Take 1 tablet (40 mg total) by mouth daily., Disp: 90 tablet, Rfl: 3   gabapentin  (NEURONTIN ) 300 MG capsule, Take 2 capsules (600 mg total) by mouth at bedtime., Disp: 180 capsule, Rfl: 1   ketoconazole  (NIZORAL ) 2 % cream, Apply 1 Application topically 2 (two) times daily as needed for irritation., Disp: 60 g, Rfl: 2   methotrexate  (RHEUMATREX) 2.5 MG tablet, TAKE 20MG (8 TABLETS) EVERY WEDNESDAY. PROTECT FROM LIGHT AS DIRECTED, Disp: 150 tablet, Rfl: 0   Multiple Vitamin (MULTIVITAMIN) tablet, Take 1 tablet by mouth daily., Disp: , Rfl:    pantoprazole  (PROTONIX ) 40 MG tablet, Take 1 tablet (40 mg total) by mouth daily. 30 min before food, Disp: 30 tablet, Rfl: 6   sacubitril -valsartan  (ENTRESTO ) 24-26 MG, Take 1 tablet by mouth 2 (two) times daily., Disp: 180 tablet, Rfl: 3   spironolactone  (ALDACTONE ) 25 MG tablet, Take 1 tablet (25 mg total) by mouth daily., Disp: 90 tablet, Rfl: 3 [2]  Allergies Allergen Reactions   Crestor  [Rosuvastatin  Calcium ] Other (See Comments)    Elevated muscle enzymes    Lipitor [Atorvastatin  Calcium ] Other (See Comments)    Elevated muscle enzymes

## 2024-07-09 ENCOUNTER — Ambulatory Visit (HOSPITAL_COMMUNITY)
Admission: RE | Admit: 2024-07-09 | Discharge: 2024-07-09 | Disposition: A | Discharge status: Home / Self Care | Source: Ambulatory Visit | Attending: Internal Medicine | Admitting: Internal Medicine

## 2024-07-09 ENCOUNTER — Other Ambulatory Visit (HOSPITAL_COMMUNITY): Payer: Self-pay

## 2024-07-09 VITALS — BP 124/80 | HR 67 | Wt 248.6 lb

## 2024-07-09 DIAGNOSIS — I110 Hypertensive heart disease with heart failure: Secondary | ICD-10-CM | POA: Insufficient documentation

## 2024-07-09 DIAGNOSIS — I5022 Chronic systolic (congestive) heart failure: Secondary | ICD-10-CM | POA: Diagnosis present

## 2024-07-09 DIAGNOSIS — I428 Other cardiomyopathies: Secondary | ICD-10-CM | POA: Insufficient documentation

## 2024-07-09 DIAGNOSIS — I5042 Chronic combined systolic (congestive) and diastolic (congestive) heart failure: Secondary | ICD-10-CM | POA: Diagnosis not present

## 2024-07-09 DIAGNOSIS — D8685 Sarcoid myocarditis: Secondary | ICD-10-CM | POA: Insufficient documentation

## 2024-07-09 DIAGNOSIS — I472 Ventricular tachycardia, unspecified: Secondary | ICD-10-CM | POA: Diagnosis not present

## 2024-07-09 DIAGNOSIS — I4892 Unspecified atrial flutter: Secondary | ICD-10-CM | POA: Insufficient documentation

## 2024-07-09 DIAGNOSIS — Z95810 Presence of automatic (implantable) cardiac defibrillator: Secondary | ICD-10-CM | POA: Diagnosis not present

## 2024-07-09 DIAGNOSIS — Z79899 Other long term (current) drug therapy: Secondary | ICD-10-CM | POA: Insufficient documentation

## 2024-07-09 DIAGNOSIS — I4891 Unspecified atrial fibrillation: Secondary | ICD-10-CM | POA: Insufficient documentation

## 2024-07-09 DIAGNOSIS — Z7901 Long term (current) use of anticoagulants: Secondary | ICD-10-CM | POA: Insufficient documentation

## 2024-07-09 DIAGNOSIS — I2510 Atherosclerotic heart disease of native coronary artery without angina pectoris: Secondary | ICD-10-CM | POA: Diagnosis not present

## 2024-07-09 DIAGNOSIS — Z87891 Personal history of nicotine dependence: Secondary | ICD-10-CM | POA: Insufficient documentation

## 2024-07-09 NOTE — Progress Notes (Signed)
 "  ADVANCED HF CLINIC NOTE   PCP: Dr. Glenys Ferrari HF Cardiologist: Dr. Cherrie  HPI: 74 y.o. 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    He established with Afib clinic in March for atrial flutter. He underwent successful DCCV 04/17.    Patient was referred to ED for admission d/t recurrent VT on monitor. Echo showed EF 35-40%, grade II DD, RV mildly reduced, RVSP 27 mmHg. Cardiac MRI: EF 33%, RVEF 40%, with multifocal areas of LGE with high intensity in basal septal LGE, elevated T2 values in lateral wall concerning for myocardial inflammation, concerning for sarcoidosis. LHC showed mild nonobstructive CAD. He was started on amiodarone  and underwent ICD placement 12/10/21. Workup including PET for cardiac sarcoid recommended as outpatient.   PYP: equivocal  Cardiac PET at [facility] (8/23) without evidence of cardiac sarcoidosis or inflammation .   Follow up 11/23, was doing well and had completed prednisone . Remains on methotrexate   7/23 had repeat PET showing no evidence of sarcoid.   Echo 12/24 EF 55-60%  Echo 12/25 EF 60-65%  Today he returns for AHF follow up with his wife and Heather from [employer]. . At last visit was volume overloaded with ReDS 41%. Lasix  40 daily started. No problem with meds. Not overly active. No CP or SOB. Volume well controlled.    Cardiac Studies: - Repeat Cardiac PET (post pred/MXT regimen) 7/24 no evidence of sarcoid.  - Cardiac PET at [facility] (8/23) EF 43% without evidence of cardiac sarcoidosis or inflammation.  - PYP (6/23): Visual and quantitative assessment (grade 1, H/CL equal 1.28) is equivocally suggestive of transthyretin amyloidosis. - LHC (6/23): mild, non-obs CAD.  - cMRI (6/23): LVEF 33%, RVEF 40%, with multifocal areas of LGE with high intensity in basal septal LGE, elevated T2 values in lateral wall concerning for myocardial inflammation,  concerning for sarcoidosis. - Echo (6/23): EF 35-40%, grade II DD, RV mildly reduced, RVSP 27 mmHg. - Zio 14 day (6/23): mostly NSR, multiple episodes of wide-complex tachycardia due to VT  Past Medical History:  Diagnosis Date   Abnormal x-ray of neck 06/16/2021   Acute encephalopathy 12/18/2023   Allergy    Arthritis    Atrial flutter (HCC)    Bilateral carpal tunnel syndrome 01/04/2024   Carpal tunnel syndrome on left    Chest pain of uncertain etiology 10/24/2019   Constipation 03/10/2016   COVID-19    07/07/19   COVID-19 virus detected 07/12/2019   DDD (degenerative disc disease), thoracolumbar    multilevel   Degenerative joint disease of left shoulder    02/2011    Diverticulosis    left colon (2008)    ED (erectile dysfunction)    02/2011    External hemorrhoid 03/07/2015   Fatigue 06/03/2016   GERD (gastroesophageal reflux disease)    H. pylori infection    Hyperlipidemia    Hypertension    Insomnia    Lactose intolerance    Low back pain    PVC (premature ventricular contraction) 10/24/2019   Shingles    07/2018   Smoking    smoking since age 75 y.o, quit in 2023   Tobacco abuse 11/06/2012   Toe fracture, left    4th in 2014   Vitamin D  deficiency    Current Outpatient Medications  Medication Sig Dispense Refill   apixaban  (ELIQUIS ) 5 MG TABS tablet TAKE 1 TABLET(5 MG) BY MOUTH  TWICE DAILY 60 tablet 11   calcium  carbonate (OS-CAL - DOSED IN MG OF ELEMENTAL CALCIUM ) 1250 (500 Ca) MG tablet Take 1 tablet (500 mg of elemental calcium  total) by mouth daily with breakfast. 30 tablet 11   carvedilol  (COREG ) 12.5 MG tablet Take 6.25 mg by mouth 2 (two) times daily with a meal. Taking 6.25mg  per Beckey Coe NP at [facility]     Cholecalciferol  (VITAMIN D -3) 125 MCG (5000 UT) TABS Take 1 tablet by mouth daily. 30 tablet 11   ezetimibe  (ZETIA ) 10 MG tablet Take 1 tablet (10 mg total) by mouth daily. 90 tablet 3   folic acid  (FOLVITE ) 1 MG tablet Take 1 tablet (1 mg total)  by mouth daily. 30 tablet 6   furosemide  (LASIX ) 40 MG tablet Take 1 tablet (40 mg total) by mouth daily. 90 tablet 3   gabapentin  (NEURONTIN ) 300 MG capsule Take 2 capsules (600 mg total) by mouth at bedtime. 180 capsule 1   ketoconazole  (NIZORAL ) 2 % cream Apply 1 Application topically 2 (two) times daily as needed for irritation. 60 g 2   methotrexate  (RHEUMATREX) 2.5 MG tablet TAKE 20MG (8 TABLETS) EVERY WEDNESDAY. PROTECT FROM LIGHT AS DIRECTED 150 tablet 0   Multiple Vitamin (MULTIVITAMIN) tablet Take 1 tablet by mouth daily.     pantoprazole  (PROTONIX ) 40 MG tablet Take 1 tablet (40 mg total) by mouth daily. 30 min before food 30 tablet 6   sacubitril -valsartan  (ENTRESTO ) 24-26 MG Take 1 tablet by mouth 2 (two) times daily. 180 tablet 3   spironolactone  (ALDACTONE ) 25 MG tablet Take 1 tablet (25 mg total) by mouth daily. 90 tablet 3   No current facility-administered medications for this encounter.   Allergies  Allergen Reactions   Crestor  [Rosuvastatin  Calcium ] Other (See Comments)    Elevated muscle enzymes    Lipitor [Atorvastatin  Calcium ] Other (See Comments)    Elevated muscle enzymes    Social History   Socioeconomic History   Marital status: Married    Spouse name: Gwendolyn   Number of children: 2   Years of education: Not on file   Highest education level: High school graduate  Occupational History   Occupation: Retired    Comment: Semi  Tobacco Use   Smoking status: Former    Current packs/day: 0.00    Average packs/day: 0.5 packs/day for 49.0 years (24.5 ttl pk-yrs)    Types: Cigarettes    Start date: 07/06/1971    Quit date: 06/26/2020    Years since quitting: 4.0   Smokeless tobacco: Never   Tobacco comments:    Former smoker 09/24/21  Vaping Use   Vaping status: Never Used  Substance and Sexual Activity   Alcohol use: No    Alcohol/week: 0.0 standard drinks of alcohol   Drug use: No   Sexual activity: Not on file  Other Topics Concern   Not on file   Social History Narrative   Married    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    On child    1 son, 1 daughter   Machine op   Owns guns    Wears seat belt, safe in relationship    Smoker    Retired 06/27/2019   Social Drivers of Health   Tobacco Use: Medium Risk (06/18/2024)   Patient History    Smoking Tobacco Use: Former    Smokeless Tobacco Use: Never    Passive Exposure: Not on Actuary Strain: Low Risk (04/09/2024)   Overall Physicist, Medical Strain (  CARDIA)    Difficulty of Paying Living Expenses: Not hard at all  Food Insecurity: No Food Insecurity (04/09/2024)   Epic    Worried About Programme Researcher, Broadcasting/film/video in the Last Year: Never true    Ran Out of Food in the Last Year: Never true  Transportation Needs: No Transportation Needs (04/09/2024)   Epic    Lack of Transportation (Medical): No    Lack of Transportation (Non-Medical): No  Physical Activity: Inactive (04/09/2024)   Exercise Vital Sign    Days of Exercise per Week: 0 days    Minutes of Exercise per Session: 0 min  Stress: No Stress Concern Present (04/09/2024)   [employer] of Occupational Health - Occupational Stress Questionnaire    Feeling of Stress: Not at all  Social Connections: Moderately Integrated (04/09/2024)   Social Connection and Isolation Panel    Frequency of Communication with Friends and Family: Once a week    Frequency of Social Gatherings with Friends and Family: More than three times a week    Attends Religious Services: Never    Database Administrator or Organizations: No    Attends Engineer, Structural: More than 4 times per year    Marital Status: Married  Catering Manager Violence: Not At Risk (04/09/2024)   Epic    Fear of Current or Ex-Partner: No    Emotionally Abused: No    Physically Abused: No    Sexually Abused: No  Depression (PHQ2-9): Low Risk (06/18/2024)   Depression (PHQ2-9)    PHQ-2 Score: 2  Alcohol Screen: Low Risk (04/09/2024)   Alcohol Screen    Last Alcohol  Screening Score (AUDIT): 0  Housing: Unknown (04/09/2024)   Epic    Unable to Pay for Housing in the Last Year: No    Number of Times Moved in the Last Year: Not on file    Homeless in the Last Year: No  Utilities: Not At Risk (04/09/2024)   Epic    Threatened with loss of utilities: No  Health Literacy: Adequate Health Literacy (04/09/2024)   B1300 Health Literacy    Frequency of need for help with medical instructions: Never   Family History  Problem Relation Age of Onset   Heart disease Mother        age 31    Throat cancer Father 72   Cancer Father        throat    Cancer Maternal Uncle        uncle ?maternal or paternal died colon cancer in his 77s    Diabetes Maternal Aunt    Heart attack Maternal Grandmother   - His mother and grandmother had heart issues, uncertain if they had CHF.  BP 124/80   Pulse 67   Wt 112.8 kg (248 lb 9.6 oz)   SpO2 96%   BMI 26.64 kg/m   Wt Readings from Last 3 Encounters:  07/09/24 112.8 kg (248 lb 9.6 oz)  07/04/24 108 kg (238 lb)  06/21/24 108.4 kg (239 lb)   PHYSICAL EXAM: General:  Sitting up  No resp difficulty HEENT: normal Neck: supple. no JVD.  Cor: Regular rate & rhythm. No rubs, gallops or murmurs. Lungs: clear Abdomen: soft, nontender, nondistended.Good bowel sounds. Extremities: no cyanosis, clubbing, rash, edema Neuro: alert & orientedx3, cranial nerves grossly intact. moves all 4 extremities w/o difficulty. Affect pleasant   MDT ICD interrogation (personally reviewed): No VT/AF. AL 1.4hr/day. Fluid looks good. Personally reviewed   ECG: n/a  ASSESSMENT & PLAN: Chronic systolic CHF/NICM in setting of cardiac sarcoidosis - Echo (6/23): EF 35-40%, grade II DD, RV mildly reduced, RVSP 27 mmHg. - Cardiac MRI (6/23): EF 33%, RVEF 40%, with multifocal areas of LGE with high intensity in basal septal LGE, elevated T2 values in lateral wall concerning for myocardial inflammation. Findings concerning for sarcoidosis.  - LHC  (6/23): mild nonobstructive CAD. - Suspect etiology cardiac sarcoidosis (most likely) vs hereditary LMNA cardiomyopathy. - Cardiac PET at [facility] (8/23) showed no evidence of cardiac sarcoidosis. EF 43% - Completed Prednisone  11/23  - Repeat PET 7/24 no evidence of sarcoid - Continue maintenance dose methotrexate  20 mg weekly (Has seen Dr. Mai who agrees) - Echo 12/24 EF 55-60%  - Echo 12/25 EF 60-65% - Doing well NYHA I-II - Volume looks good. Lasix  40 daily - Continue spironolactone  25 mg daily.  - Continue Coreg  6.25 mg bid. - Continue Entresto  24/26 mg bid. H/o low BP/orthostatics limits further titration, plan to reevaluate at f/u.  - Off Farxiga  with UTI x2 since June/2025. Would not restart.  - ICD interrogated personally in clinic - Recent labs ok with PCP - Follows with Dr. Mai in Rheum for methotrexate    2. VT/Hx syncope: - No VT on ICD - Now off amio as sarcoid quiescent and EF normal   3. Atrial flutter: - In NSR. Off amio - Continue Eliquis   4. CAD: - Mild on LHC 6/23. - no s/s angina - no ASA w/ a/c  - Continue Zetia . Has history of elevated LFTs with statins.   Compliance much improved. Can decrease Paramedicine to monthly. Ill see back in 9 months.   Toribio Fuel, MD  8:48 AM  "

## 2024-07-09 NOTE — Progress Notes (Signed)
 Paramedicine Encounter  Patient ID: John Keith, male, DOB: [birth year], 74 y.o.,  MRN: [id number]  Met patient in clinic today with provider.   Weight @ clinic-248lbs Weight @ home-  238lbs   B/P- 124/80   P- 67  SP02- 96%   REDS CLIP- N/A   Med changes- NONE   Social Changes- NONE    Met with John Keith in the clinic today where he was seen by Dr. Cherrie. He reports feeling well with no complaints. Dr [name] gave him a great report and says he is doing great. He is to be seen back in clinic in 9 months. We will be preparing him for paramedicine graduation and weaning him to monthly paramedicine visits. He is to continue to be followed by Dr. Mai at [facility]. No med changes today. I will see John Keith in the home next week. He agreed. Clinic visit complete.    -refills for methotrexate  (VM called in to his assistant Damien at their office as of 1345)     [name], EMT-Paramedic [phone] 07/09/2024

## 2024-07-09 NOTE — Patient Instructions (Signed)
" °  Follow-Up in: IN 9 MONTHS WITH DR. CHERRIE PLEASE CALL OUR OFFICE AROUND AUGUST 2026 TO GET SCHEDULED FOR YOUR APPOINTMENT. PHONE NUMBER IS [phone] OPTION 2   At the [facility], you and your health needs are our priority. We have a designated team specialized in the treatment of Heart Failure. This Care Team includes your primary Heart Failure Specialized Cardiologist (physician), Advanced Practice Providers (APPs- Physician Assistants and Nurse Practitioners), and Pharmacist who all work together to provide you with the care you need, when you need it.   You may see any of the following providers on your designated Care Team at your next follow up:  Dr. Toribio Cherrie Dr. Ezra Shuck Dr. Odis Brownie Greig Mosses, NP Caffie Shed, [place] [facility] [place], [place] Beckey Coe, NP Jordan Lee, NP Tinnie Redman, PharmD   Please be sure to bring in all your medications bottles to every appointment.   Need to Contact Us :  If you have any questions or concerns before your next appointment please send us  a message through [place] or call our office at [phone].    TO LEAVE A MESSAGE FOR THE NURSE SELECT OPTION 2, PLEASE LEAVE A MESSAGE INCLUDING: YOUR NAME DATE OF BIRTH CALL BACK NUMBER REASON FOR CALL**this is important as we prioritize the call backs  YOU WILL RECEIVE A CALL BACK THE SAME DAY AS LONG AS YOU CALL BEFORE 4:00 PM  "

## 2024-07-09 NOTE — Addendum Note (Signed)
 Encounter addended by: Divit Stipp B, RN on: 07/09/2024 9:11 AM  Actions taken: Clinical Note Signed

## 2024-07-12 ENCOUNTER — Telehealth (HOSPITAL_COMMUNITY): Payer: Self-pay

## 2024-07-12 NOTE — Telephone Encounter (Signed)
 Contacted [facility] in [place] to see if they have filled his Methotrexate  and they have not received the refills yet.   I called Dr. Eustacio office about refills and had to leave a message on the nurse line for same.   I will follow up on Monday.    [name], EMT-Paramedic [phone] 07/12/2024

## 2024-07-13 ENCOUNTER — Encounter: Payer: Self-pay | Admitting: Internal Medicine

## 2024-07-13 MED ORDER — CARVEDILOL 12.5 MG PO TABS: 12.5000 mg | ORAL_TABLET | Freq: Two times a day (BID) | ORAL | 2 refills | Status: AC

## 2024-07-19 ENCOUNTER — Other Ambulatory Visit (HOSPITAL_COMMUNITY): Payer: Self-pay

## 2024-07-19 NOTE — Progress Notes (Signed)
 Paramedicine Encounter    Patient ID: John Keith, male    DOB: 05/30/1951, 74 y.o.   MRN: [id number]   Complaints- none   Assessment- CAOx4, warm and dry seated at the kitchen table, no complaints, lungs clear, vitals within normal (he had not had his morning meds yet) weight down 7 lbs. No swelling.   Compliance with meds- no missed doses   Pill box filled- for two weeks   Refills needed- none    Meds changes since last visit- has elected to take his Lasix  MWF rather than every day- he feels he is peeing a lot and he has lost 7 lbs since our last visit.   Social changes- none    VISIT SUMMARY- Arrived for home visit for John Keith who reports to be feeling well with no complaints today. He says he has been urinating a lot and is down 7 lbs from our last visit in clinic. He has no shortness of breath, no swelling, no edema, no abdominal distention, no JVD, vitals within normal. He elects to take Lasix  MWF rather than daily. I made note of same in his med list and advised him if he begins to gain weight or swell or have shortness of breath, ect to go back to taking it daily. He agreed. We reviewed meds one at a time and filled pill box together. Appointments set up and reviewed. I plan to see him in two weeks. He agreed with same.   BP (!) 142/80   Pulse 68   Resp 16   Wt 241 lb 6.4 oz (109.5 kg)   SpO2 97%   BMI 25.87 kg/m  Weight yesterday-- 241.8lbs  Last visit weight-- 248lbs      ACTION: Home visit completed     Patient Care Team: Abbey Bruckner, MD as PCP - General (Family Medicine) [name], MD as PCP - Electrophysiology (Cardiology) [name], [name], MD as PCP - Cardiology (Cardiology) [name], MD as Consulting Physician (Internal Medicine) [name], MD as Attending Physician (Cardiology) [name], MD as Consulting Physician (Cardiology)  Patient Active Problem List   Diagnosis Date Noted   Ascending aorta dilation  05/01/2024   Emphysema lung (HCC) 05/01/2024   Cardiac sarcoidosis 01/04/2024   Primary osteoarthritis 01/04/2024   Presence of heart assist device (HCC) 01/04/2024   Rash 01/04/2024   On anticoagulant therapy 08/29/2023   On amiodarone  therapy 08/29/2023   Statin myopathy 11/15/2022   Encounter for screening for lung cancer 11/15/2022   Chronic systolic heart failure (HCC) 12/15/2021   Chronic combined systolic and diastolic CHF (congestive heart failure) (HCC) 12/15/2021   Ventricular tachycardia (HCC) 12/07/2021   Typical atrial flutter (HCC) 09/24/2021   Secondary hypercoagulable state 09/24/2021   Abnormal MRI, lumbar spine 10/23/2020   Chronic low back pain 10/23/2020   Atherosclerosis of native coronary artery of native heart without angina pectoris 10/23/2020   Impingement syndrome of left shoulder region 08/22/2020   Pain in joint of left shoulder 07/24/2020   Chronic left shoulder pain 07/11/2020   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   Normocytic normochromic anemia 12/13/2019   PVC (premature ventricular contraction) 10/24/2019   Ventricular premature beats 10/24/2019   Elevated ferritin 07/07/2019   Postherpetic neuralgia 08/17/2018   Benign prostatic hyperplasia 08/01/2018   Hip pain 06/29/2018   Aortic atherosclerosis 09/22/2016   Lower urinary tract symptoms (LUTS) 06/03/2016   Lumbar radiculopathy 02/20/2016   Insomnia 06/10/2015   Health care maintenance 01/09/2014   Hyperlipidemia  11/09/2012   Essential hypertension 11/06/2012   Seasonal allergies 11/06/2012   Erectile dysfunction 11/06/2012   GERD (gastroesophageal reflux disease) 11/06/2012   Lactose intolerance 11/06/2012   Diverticulosis of colon without hemorrhage 11/06/2012   Degeneration of thoracolumbar intervertebral disc 11/06/2012   Current Medications[1] Allergies[2]   Social History   Socioeconomic History   Marital status: Married    Spouse name: Proofreader   Number of children: 2    Years of education: Not on file   Highest education level: High school graduate  Occupational History   Occupation: Retired    Comment: Semi  Tobacco Use   Smoking status: Former    Current packs/day: 0.00    Average packs/day: 0.5 packs/day for 49.0 years (24.5 ttl pk-yrs)    Types: Cigarettes    Start date: 07/06/1971    Quit date: 06/26/2020    Years since quitting: 4.0   Smokeless tobacco: Never   Tobacco comments:    Former smoker 09/24/21  Vaping Use   Vaping status: Never Used  Substance and Sexual Activity   Alcohol use: No    Alcohol/week: 0.0 standard drinks of alcohol   Drug use: No   Sexual activity: Not on file  Other Topics Concern   Not on file  Social History Narrative   Married    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    On child    1 son, 1 daughter   Machine op   Owns guns    Wears seat belt, safe in relationship    Smoker    Retired 06/27/2019   Social Drivers of Health   Tobacco Use: Medium Risk (06/18/2024)   Patient History    Smoking Tobacco Use: Former    Smokeless Tobacco Use: Never    Passive Exposure: Not on Actuary Strain: Low Risk (04/09/2024)   Overall Financial Resource Strain (CARDIA)    Difficulty of Paying Living Expenses: Not hard at all  Food Insecurity: No Food Insecurity (04/09/2024)   Epic    Worried About Programme Researcher, Broadcasting/film/video in the Last Year: Never true    Ran Out of Food in the Last Year: Never true  Transportation Needs: No Transportation Needs (04/09/2024)   Epic    Lack of Transportation (Medical): No    Lack of Transportation (Non-Medical): No  Physical Activity: Inactive (04/09/2024)   Exercise Vital Sign    Days of Exercise per Week: 0 days    Minutes of Exercise per Session: 0 min  Stress: No Stress Concern Present (04/09/2024)   [employer] of Occupational Health - Occupational Stress Questionnaire    Feeling of Stress: Not at all  Social Connections: Moderately Integrated (04/09/2024)   Social Connection and  Isolation Panel    Frequency of Communication with Friends and Family: Once a week    Frequency of Social Gatherings with Friends and Family: More than three times a week    Attends Religious Services: Never    Database Administrator or Organizations: No    Attends Engineer, Structural: More than 4 times per year    Marital Status: Married  Catering Manager Violence: Not At Risk (04/09/2024)   Epic    Fear of Current or Ex-Partner: No    Emotionally Abused: No    Physically Abused: No    Sexually Abused: No  Depression (PHQ2-9): Low Risk (06/18/2024)   Depression (PHQ2-9)    PHQ-2 Score: 2  Alcohol Screen: Low Risk (04/09/2024)   Alcohol Screen  Last Alcohol Screening Score (AUDIT): 0  Housing: Unknown (04/09/2024)   Epic    Unable to Pay for Housing in the Last Year: No    Number of Times Moved in the Last Year: Not on file    Homeless in the Last Year: No  Utilities: Not At Risk (04/09/2024)   Epic    Threatened with loss of utilities: No  Health Literacy: Adequate Health Literacy (04/09/2024)   B1300 Health Literacy    Frequency of need for help with medical instructions: Never    Physical Exam      Future Appointments  Date Time Provider Department Center  08/02/2024  8:30 AM Kassie Acquanetta Bradley, MD [id number]  08/14/2024  8:00 AM Leverne Charlies Helling, PA-C [facility]  08/23/2024  9:15 AM McCaughan, Dia D, DPM [facility]  09/07/2024  7:00 AM [facility] H[facility] DEVICE REMOTES [facility]  10/19/2024  8:00 AM Bair, Luke, MD [facility] [id number]  12/07/2024  7:00 AM [facility] HVT DEVICE REMOTES [facility]  03/08/2025  7:00 AM [facility] HVT DEVICE REMOTES [facility]  04/15/2025  8:10 AM [facility] ANNUAL WELLNESS VISIT [facility] [id number]  06/07/2025  7:00 AM [facility] HVT DEVICE REMOTES [facility]  09/06/2025  7:00 AM [facility] HVT DEVICE REMOTES [facility]                 [1]  Current Outpatient Medications:    apixaban  (ELIQUIS ) 5 MG TABS tablet, TAKE 1  TABLET(5 MG) BY MOUTH TWICE DAILY, Disp: 60 tablet, Rfl: 11   calcium  carbonate (OS-CAL - DOSED IN MG OF ELEMENTAL CALCIUM ) 1250 (500 Ca) MG tablet, Take 1 tablet (500 mg of elemental calcium  total) by mouth daily with breakfast., Disp: 30 tablet, Rfl: 11   carvedilol  (COREG ) 12.5 MG tablet, Take 1 tablet (12.5 mg total) by mouth 2 (two) times daily., Disp: 180 tablet, Rfl: 2   Cholecalciferol  (VITAMIN D -3) 125 MCG (5000 UT) TABS, Take 1 tablet by mouth daily., Disp: 30 tablet, Rfl: 11   ezetimibe  (ZETIA ) 10 MG tablet, Take 1 tablet (10 mg total) by mouth daily., Disp: 90 tablet, Rfl: 3   folic acid  (FOLVITE ) 1 MG tablet, Take 1 tablet (1 mg total) by mouth daily., Disp: 30 tablet, Rfl: 6   furosemide  (LASIX ) 40 MG tablet, Take 1 tablet (40 mg total) by mouth daily. (Patient taking differently: Take 40 mg by mouth daily. Taking every other day MWF- per patient request due to over excessive urine output.), Disp: 90 tablet, Rfl: 3   gabapentin  (NEURONTIN ) 300 MG capsule, Take 2 capsules (600 mg total) by mouth at bedtime., Disp: 180 capsule, Rfl: 1   ketoconazole  (NIZORAL ) 2 % cream, Apply 1 Application topically 2 (two) times daily as needed for irritation., Disp: 60 g, Rfl: 2   methotrexate  (RHEUMATREX) 2.5 MG tablet, TAKE 20MG (8 TABLETS) EVERY WEDNESDAY. PROTECT FROM LIGHT AS DIRECTED, Disp: 150 tablet, Rfl: 0   Multiple Vitamin (MULTIVITAMIN) tablet, Take 1 tablet by mouth daily., Disp: , Rfl:    pantoprazole  (PROTONIX ) 40 MG tablet, Take 1 tablet (40 mg total) by mouth daily. 30 min before food, Disp: 30 tablet, Rfl: 6   sacubitril -valsartan  (ENTRESTO ) 24-26 MG, Take 1 tablet by mouth 2 (two) times daily., Disp: 180 tablet, Rfl: 3   spironolactone  (ALDACTONE ) 25 MG tablet, Take 1 tablet (25 mg total) by mouth daily., Disp: 90 tablet, Rfl: 3 [2]  Allergies Allergen Reactions   Crestor  [Rosuvastatin  Calcium ] Other (See Comments)    Elevated  muscle enzymes    Lipitor [Atorvastatin  Calcium ] Other  (See Comments)    Elevated muscle enzymes

## 2024-07-30 ENCOUNTER — Telehealth (HOSPITAL_COMMUNITY): Payer: Self-pay

## 2024-07-30 ENCOUNTER — Other Ambulatory Visit (HOSPITAL_COMMUNITY): Payer: Self-pay

## 2024-07-30 NOTE — Telephone Encounter (Signed)
 Advanced Heart Failure Patient Advocate Encounter  The patient was renewed for a [employer] grant that will help cover the cost of Carvedilol , Eliquis , Entresto , Spironolactone .  Total amount awarded, $7,500.  Effective: 06/30/2024 - 06/29/2025.  BIN [id number] PCN [id number] Group [id number] ID [id number]  Pharmacy provided with approval and processing information. Patient informed via MyChart.  Rachel DEL, CPhT Rx Patient Advocate Phone: [phone]

## 2024-08-02 ENCOUNTER — Other Ambulatory Visit (HOSPITAL_COMMUNITY): Payer: Self-pay

## 2024-08-02 ENCOUNTER — Ambulatory Visit (HOSPITAL_BASED_OUTPATIENT_CLINIC_OR_DEPARTMENT_OTHER): Admitting: Pulmonary Disease

## 2024-08-02 NOTE — Progress Notes (Signed)
 Paramedicine Encounter    Patient ID: John Keith, male    DOB: 1951/02/07, 74 y.o.   MRN: [id number]   Complaints- none   Assessment- CAOX4, warm and dry seated at kitchen table with no shortness of breath, no swelling, lungs clear, vitals within normal limits.   Compliance with meds- skipping lasix - saying its making him urinate too much. He wants me to ask [facility] if he can cut the pill in half or only take as needed.   Pill box filled- for two weeks   Refills needed- methotrexate , zetia , eliquis    Meds changes since last visit- none     Social changes- none    VISIT SUMMARY- Arrived for home visit for [name] who reports to be feeling well. Vitals and assessment obtained. No swelling, lungs clear, vitals within normal. He says he has been skipping his lasix  sometimes because its making him urinate too much. I explained to him that's the purpose of the medication however he is wanting me to ask the [facility] if he can reduce the amount he takes or only use as needed. I will message [facility] for further. I reviewed meds and filled pill box for two weeks. Refills recorded and will call them into [facility]. We reviewed upcoming appointments and confirmed same. HF education provided and home visit planned for two weeks.   BP 118/68   Pulse 69   Resp 18   Wt 239 lb (108.4 kg)   SpO2 96%   BMI 25.61 kg/m  Weight yesterday-- 238lbs  Last visit weight-- 241lbs      ACTION: Home visit completed     Patient Care Team: Bair, Kalpana, MD as PCP - General (Family Medicine) [name], MD as PCP - Electrophysiology (Cardiology) [name], [name], MD as PCP - Cardiology (Cardiology) [name], MD as Consulting Physician (Internal Medicine) [name], MD as Attending Physician (Cardiology) [name], MD as Consulting Physician (Cardiology)  Patient Active Problem List   Diagnosis Date Noted   Ascending aorta dilation 05/01/2024   Emphysema lung (HCC)  05/01/2024   Cardiac sarcoidosis 01/04/2024   Primary osteoarthritis 01/04/2024   Presence of heart assist device (HCC) 01/04/2024   Rash 01/04/2024   On anticoagulant therapy 08/29/2023   On amiodarone  therapy 08/29/2023   Statin myopathy 11/15/2022   Encounter for screening for lung cancer 11/15/2022   Chronic systolic heart failure (HCC) 12/15/2021   Chronic combined systolic and diastolic CHF (congestive heart failure) (HCC) 12/15/2021   Ventricular tachycardia (HCC) 12/07/2021   Typical atrial flutter (HCC) 09/24/2021   Secondary hypercoagulable state 09/24/2021   Abnormal MRI, lumbar spine 10/23/2020   Chronic low back pain 10/23/2020   Atherosclerosis of native coronary artery of native heart without angina pectoris 10/23/2020   Impingement syndrome of left shoulder region 08/22/2020   Pain in joint of left shoulder 07/24/2020   Chronic left shoulder pain 07/11/2020   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   Normocytic normochromic anemia 12/13/2019   PVC (premature ventricular contraction) 10/24/2019   Ventricular premature beats 10/24/2019   Elevated ferritin 07/07/2019   Postherpetic neuralgia 08/17/2018   Benign prostatic hyperplasia 08/01/2018   Hip pain 06/29/2018   Aortic atherosclerosis 09/22/2016   Lower urinary tract symptoms (LUTS) 06/03/2016   Lumbar radiculopathy 02/20/2016   Insomnia 06/10/2015   Health care maintenance 01/09/2014   Hyperlipidemia 11/09/2012   Essential hypertension 11/06/2012   Seasonal allergies 11/06/2012   Erectile dysfunction 11/06/2012   GERD (gastroesophageal reflux disease) 11/06/2012  Lactose intolerance 11/06/2012   Diverticulosis of colon without hemorrhage 11/06/2012   Degeneration of thoracolumbar intervertebral disc 11/06/2012   Current Medications[1] Allergies[2]   Social History   Socioeconomic History   Marital status: Married    Spouse name: Proofreader   Number of children: 2   Years of education: Not on file    Highest education level: High school graduate  Occupational History   Occupation: Retired    Comment: Semi  Tobacco Use   Smoking status: Former    Current packs/day: 0.00    Average packs/day: 0.5 packs/day for 49.0 years (24.5 ttl pk-yrs)    Types: Cigarettes    Start date: 07/06/1971    Quit date: 06/26/2020    Years since quitting: 4.1   Smokeless tobacco: Never   Tobacco comments:    Former smoker 09/24/21  Vaping Use   Vaping status: Never Used  Substance and Sexual Activity   Alcohol use: No    Alcohol/week: 0.0 standard drinks of alcohol   Drug use: No   Sexual activity: Not on file  Other Topics Concern   Not on file  Social History Narrative   Married    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    On child    1 son, 1 daughter   Machine op   Owns guns    Wears seat belt, safe in relationship    Smoker    Retired 06/27/2019   Social Drivers of Health   Tobacco Use: Medium Risk (06/18/2024)   Patient History    Smoking Tobacco Use: Former    Smokeless Tobacco Use: Never    Passive Exposure: Not on Actuary Strain: Low Risk (04/09/2024)   Overall Financial Resource Strain (CARDIA)    Difficulty of Paying Living Expenses: Not hard at all  Food Insecurity: No Food Insecurity (04/09/2024)   Epic    Worried About Programme Researcher, Broadcasting/film/video in the Last Year: Never true    Ran Out of Food in the Last Year: Never true  Transportation Needs: No Transportation Needs (04/09/2024)   Epic    Lack of Transportation (Medical): No    Lack of Transportation (Non-Medical): No  Physical Activity: Inactive (04/09/2024)   Exercise Vital Sign    Days of Exercise per Week: 0 days    Minutes of Exercise per Session: 0 min  Stress: No Stress Concern Present (04/09/2024)   [employer] of Occupational Health - Occupational Stress Questionnaire    Feeling of Stress: Not at all  Social Connections: Moderately Integrated (04/09/2024)   Social Connection and Isolation Panel    Frequency of  Communication with Friends and Family: Once a week    Frequency of Social Gatherings with Friends and Family: More than three times a week    Attends Religious Services: Never    Database Administrator or Organizations: No    Attends Engineer, Structural: More than 4 times per year    Marital Status: Married  Catering Manager Violence: Not At Risk (04/09/2024)   Epic    Fear of Current or Ex-Partner: No    Emotionally Abused: No    Physically Abused: No    Sexually Abused: No  Depression (PHQ2-9): Low Risk (06/18/2024)   Depression (PHQ2-9)    PHQ-2 Score: 2  Alcohol Screen: Low Risk (04/09/2024)   Alcohol Screen    Last Alcohol Screening Score (AUDIT): 0  Housing: Unknown (04/09/2024)   Epic    Unable to Pay for Housing in  the Last Year: No    Number of Times Moved in the Last Year: Not on file    Homeless in the Last Year: No  Utilities: Not At Risk (04/09/2024)   Epic    Threatened with loss of utilities: No  Health Literacy: Adequate Health Literacy (04/09/2024)   B1300 Health Literacy    Frequency of need for help with medical instructions: Never    Physical Exam      Future Appointments  Date Time Provider Department Center  08/14/2024  8:00 AM Leverne Charlies Helling, PA-C [facility]  08/23/2024  9:15 AM McCaughan, Dia D, DPM [facility]  09/07/2024  7:00 AM [facility] DEVICE REMOTES [facility]  10/19/2024  8:00 AM Bair, Luke, MD [facility] [address]  12/07/2024  7:00 AM [facility]VT DEVICE REMOTES [facility]  03/08/2025  7:00 AM [facility] DEVICE REMOTES [facility]  04/15/2025  8:10 AM [facility] ANNUAL WELLNESS VISIT [facility] [address]  06/07/2025  7:00 AM [facility]VT DEVICE REMOTES [facility]  09/06/2025  7:00 AM [facility] HVT DEVICE REMOTES [facility]                 [1]  Current Outpatient Medications:    apixaban  (ELIQUIS ) 5 MG TABS tablet, TAKE 1 TABLET(5 MG) BY MOUTH TWICE DAILY, Disp: 60 tablet, Rfl: 11   calcium  carbonate (OS-CAL - DOSED  IN MG OF ELEMENTAL CALCIUM ) 1250 (500 Ca) MG tablet, Take 1 tablet (500 mg of elemental calcium  total) by mouth daily with breakfast., Disp: 30 tablet, Rfl: 11   carvedilol  (COREG ) 12.5 MG tablet, Take 1 tablet (12.5 mg total) by mouth 2 (two) times daily., Disp: 180 tablet, Rfl: 2   Cholecalciferol  (VITAMIN D -3) 125 MCG (5000 UT) TABS, Take 1 tablet by mouth daily., Disp: 30 tablet, Rfl: 11   ezetimibe  (ZETIA ) 10 MG tablet, Take 1 tablet (10 mg total) by mouth daily., Disp: 90 tablet, Rfl: 3   folic acid  (FOLVITE ) 1 MG tablet, Take 1 tablet (1 mg total) by mouth daily., Disp: 30 tablet, Rfl: 6   gabapentin  (NEURONTIN ) 300 MG capsule, Take 2 capsules (600 mg total) by mouth at bedtime., Disp: 180 capsule, Rfl: 1   ketoconazole  (NIZORAL ) 2 % cream, Apply 1 Application topically 2 (two) times daily as needed for irritation., Disp: 60 g, Rfl: 2   methotrexate  (RHEUMATREX) 2.5 MG tablet, TAKE 20MG (8 TABLETS) EVERY WEDNESDAY. PROTECT FROM LIGHT AS DIRECTED, Disp: 150 tablet, Rfl: 0   Multiple Vitamin (MULTIVITAMIN) tablet, Take 1 tablet by mouth daily., Disp: , Rfl:    pantoprazole  (PROTONIX ) 40 MG tablet, Take 1 tablet (40 mg total) by mouth daily. 30 min before food, Disp: 30 tablet, Rfl: 6   sacubitril -valsartan  (ENTRESTO ) 24-26 MG, Take 1 tablet by mouth 2 (two) times daily., Disp: 180 tablet, Rfl: 3   spironolactone  (ALDACTONE ) 25 MG tablet, Take 1 tablet (25 mg total) by mouth daily., Disp: 90 tablet, Rfl: 3   furosemide  (LASIX ) 40 MG tablet, Take 1 tablet (40 mg total) by mouth daily. (Patient taking differently: Take 40 mg by mouth daily. Taking every other day MWF- per patient request due to over excessive urine output.), Disp: 90 tablet, Rfl: 3 [2]  Allergies Allergen Reactions   Crestor  [Rosuvastatin  Calcium ] Other (See Comments)    Elevated muscle enzymes    Lipitor [Atorvastatin  Calcium ] Other (See Comments)    Elevated muscle enzymes

## 2024-08-03 ENCOUNTER — Telehealth (HOSPITAL_COMMUNITY): Payer: Self-pay

## 2024-08-03 NOTE — Telephone Encounter (Signed)
 Pt aware.

## 2024-08-10 NOTE — Progress Notes (Unsigned)
 " Cardiology Office Note:  .   Date:  08/10/2024  ID:  John Keith, DOB 1950/11/27, MRN [id number] PCP: Abbey Bruckner, MD  [facility] Providers Cardiologist:  Toribio Fuel, MD Electrophysiologist:  Soyla Gladis Norton, MD {  History of Present Illness: .   John Keith is a 74 y.o. male w/PMHx of  HTN, HLD, Emphysema, GERD AFlutter VT NICM, chronic systolic HF Sarcoid (Follows with Dr. Mai in [place] for methotrexate ) ICD  He saw Dr. [name] 1/5/2, , volume stable, no symptoms reported  Today's visit is scheduled as an annual visit ROS:   *** device only *** symptoms, volume *** any AF? *** eliquis , dose, bleeding, labs   Device information MDT (Visia AF) single chamber ICD implanted 12/10/21  Arrhythmia/AAD hx AFlutter VT Amiodarone  started 2023 > off once sarcoid was quiescent and EF normalized (Oct 2025)  Studies Reviewed: SABRA    EKG not done today  DEVICE interrogation done today and reviewed by myself *** Battery and lead measurements are good ***  06/07/24: TTE 1. Left ventricular ejection fraction, by estimation, is 60 to 65%. The  left ventricle has normal function. The left ventricle has no regional  wall motion abnormalities. There is mild left ventricular hypertrophy.  Left ventricular diastolic parameters  are consistent with Grade I diastolic dysfunction (impaired relaxation).  The average left ventricular global longitudinal strain is -15.3 %. The  global longitudinal strain is normal.   2. Right ventricular systolic function is normal. The right ventricular  size is normal. There is normal pulmonary artery systolic pressure. The  estimated right ventricular systolic pressure is 24.3 mmHg.   3. The mitral valve is normal in structure. No evidence of mitral valve  regurgitation. No evidence of mitral stenosis.   4. The aortic valve is tricuspid. There is mild calcification of the  aortic valve. Aortic valve regurgitation is  trivial.   5. The inferior vena cava is normal in size with greater than 50%  respiratory variability, suggesting right atrial pressure of 3 mmHg.    - Repeat Cardiac PET (post pred/MXT regimen) 7/24 no evidence of sarcoid.  - [facility]iac [facility] at [facility] (8/23) EF 43% without evidence of cardiac sarcoidosis or inflammation.  - PYP (6/23): Visual and quantitative assessment (grade 1, H/CL equal 1.28) is equivocally suggestive of transthyretin amyloidosis. - LHC (6/23): mild, non-obs CAD.  - cMRI (6/23): LVEF 33%, RVEF 40%, with multifocal areas of LGE with high intensity in basal septal LGE, elevated T2 values in lateral wall concerning for myocardial inflammation, concerning for sarcoidosis. - Echo (6/23): EF 35-40%, grade II DD, RV mildly reduced, RVSP 27 mmHg. - Zio 14 day (6/23): mostly NSR, multiple episodes of wide-complex tachycardia due to VT   Risk Assessment/Calculations:    Physical Exam:   VS:  There were no vitals taken for this visit.   Wt Readings from Last 3 Encounters:  08/02/24 239 lb (108.4 kg)  07/19/24 241 lb 6.4 oz (109.5 kg)  07/09/24 248 lb 9.6 oz (112.8 kg)    GEN: Well nourished, well developed in no acute distress NECK: No JVD; No carotid bruits CARDIAC: ***RRR, no murmurs, rubs, gallops RESPIRATORY:  *** CTA b/l without rales, wheezing or rhonchi  ABDOMEN: Soft, non-tender, non-distended EXTREMITIES: *** No edema; No deformity   ICD site: *** is stable, no thinning, fluctuation, tethering  ASSESSMENT AND PLAN: .    ICD *** intact function *** no programming changes made   AFlutter CHA2DS2Vasc is 3, on  Eliquis , *** dosed appropriately *** % burden   NICM Sarcoid Recovered LVEF 60-65%, Dec 2025 *** C/w Dr. Boone   Secondary hypercoagulable state 2/2 AFib     {Are you ordering a CV Procedure (e.g. stress test, cath, DCCV, TEE, etc)?   Press F2        :[id number]}     Dispo: ***  Signed, Charlies Macario Arthur, PA-C   "

## 2024-08-14 ENCOUNTER — Ambulatory Visit: Admitting: Physician Assistant

## 2024-08-23 ENCOUNTER — Ambulatory Visit: Admitting: Podiatry

## 2024-09-07 ENCOUNTER — Ambulatory Visit

## 2024-10-18 ENCOUNTER — Ambulatory Visit

## 2024-10-19 ENCOUNTER — Ambulatory Visit

## 2024-12-07 ENCOUNTER — Ambulatory Visit

## 2025-03-08 ENCOUNTER — Ambulatory Visit

## 2025-04-15 ENCOUNTER — Ambulatory Visit

## 2025-06-07 ENCOUNTER — Ambulatory Visit

## 2025-09-06 ENCOUNTER — Ambulatory Visit
